# Patient Record
Sex: Male | Born: 1960 | Hispanic: Yes | Marital: Married | Smoking: Never smoker
Health system: Southern US, Community
[De-identification: ages and names within clinical notes are randomized; demographics above are authoritative.]

## PROBLEM LIST (undated history)

## (undated) DIAGNOSIS — I219 Acute myocardial infarction, unspecified: Secondary | ICD-10-CM

## (undated) DIAGNOSIS — E785 Hyperlipidemia, unspecified: Secondary | ICD-10-CM

## (undated) DIAGNOSIS — D638 Anemia in other chronic diseases classified elsewhere: Secondary | ICD-10-CM

## (undated) DIAGNOSIS — K219 Gastro-esophageal reflux disease without esophagitis: Secondary | ICD-10-CM

## (undated) DIAGNOSIS — Z89512 Acquired absence of left leg below knee: Secondary | ICD-10-CM

## (undated) DIAGNOSIS — E7800 Pure hypercholesterolemia, unspecified: Secondary | ICD-10-CM

## (undated) DIAGNOSIS — I10 Essential (primary) hypertension: Secondary | ICD-10-CM

## (undated) DIAGNOSIS — R0989 Other specified symptoms and signs involving the circulatory and respiratory systems: Secondary | ICD-10-CM

## (undated) DIAGNOSIS — I4729 Other ventricular tachycardia: Secondary | ICD-10-CM

## (undated) DIAGNOSIS — I5030 Unspecified diastolic (congestive) heart failure: Secondary | ICD-10-CM

## (undated) DIAGNOSIS — Z9889 Other specified postprocedural states: Secondary | ICD-10-CM

## (undated) DIAGNOSIS — I639 Cerebral infarction, unspecified: Secondary | ICD-10-CM

## (undated) DIAGNOSIS — Z992 Dependence on renal dialysis: Secondary | ICD-10-CM

## (undated) DIAGNOSIS — Z89611 Acquired absence of right leg above knee: Secondary | ICD-10-CM

## (undated) DIAGNOSIS — I472 Ventricular tachycardia: Secondary | ICD-10-CM

## (undated) DIAGNOSIS — N186 End stage renal disease: Secondary | ICD-10-CM

## (undated) DIAGNOSIS — Z8679 Personal history of other diseases of the circulatory system: Secondary | ICD-10-CM

## (undated) DIAGNOSIS — R112 Nausea with vomiting, unspecified: Secondary | ICD-10-CM

## (undated) DIAGNOSIS — I779 Disorder of arteries and arterioles, unspecified: Secondary | ICD-10-CM

## (undated) DIAGNOSIS — I2510 Atherosclerotic heart disease of native coronary artery without angina pectoris: Secondary | ICD-10-CM

## (undated) DIAGNOSIS — I7090 Unspecified atherosclerosis: Secondary | ICD-10-CM

## (undated) DIAGNOSIS — I480 Paroxysmal atrial fibrillation: Secondary | ICD-10-CM

## (undated) DIAGNOSIS — E118 Type 2 diabetes mellitus with unspecified complications: Secondary | ICD-10-CM

## (undated) HISTORY — PX: CATARACT EXTRACTION, BILATERAL: SHX1313

## (undated) HISTORY — DX: Unspecified diastolic (congestive) heart failure: I50.30

## (undated) HISTORY — PX: LEG AMPUTATION BELOW KNEE: SHX694

## (undated) HISTORY — DX: Paroxysmal atrial fibrillation: I48.0

---

## 2006-10-22 ENCOUNTER — Other Ambulatory Visit: Payer: Self-pay

## 2006-10-22 ENCOUNTER — Emergency Department: Payer: Self-pay | Admitting: Emergency Medicine

## 2007-02-17 ENCOUNTER — Emergency Department: Payer: Self-pay | Admitting: Emergency Medicine

## 2007-02-17 ENCOUNTER — Other Ambulatory Visit: Payer: Self-pay

## 2008-12-02 ENCOUNTER — Ambulatory Visit: Payer: Self-pay | Admitting: Internal Medicine

## 2009-01-04 ENCOUNTER — Encounter: Payer: Self-pay | Admitting: Internal Medicine

## 2009-01-13 ENCOUNTER — Encounter: Payer: Self-pay | Admitting: Internal Medicine

## 2009-05-24 ENCOUNTER — Ambulatory Visit: Payer: Self-pay | Admitting: Internal Medicine

## 2009-05-31 ENCOUNTER — Ambulatory Visit: Payer: Self-pay | Admitting: Internal Medicine

## 2011-06-21 ENCOUNTER — Ambulatory Visit: Payer: Self-pay | Admitting: Vascular Surgery

## 2011-08-01 DIAGNOSIS — H5231 Anisometropia: Secondary | ICD-10-CM | POA: Insufficient documentation

## 2011-08-01 DIAGNOSIS — E113299 Type 2 diabetes mellitus with mild nonproliferative diabetic retinopathy without macular edema, unspecified eye: Secondary | ICD-10-CM | POA: Insufficient documentation

## 2011-08-01 DIAGNOSIS — H26499 Other secondary cataract, unspecified eye: Secondary | ICD-10-CM | POA: Insufficient documentation

## 2011-08-01 DIAGNOSIS — H2510 Age-related nuclear cataract, unspecified eye: Secondary | ICD-10-CM | POA: Insufficient documentation

## 2012-06-17 ENCOUNTER — Ambulatory Visit: Payer: Self-pay | Admitting: Vascular Surgery

## 2012-11-25 ENCOUNTER — Ambulatory Visit: Payer: Self-pay | Admitting: Vascular Surgery

## 2013-03-10 ENCOUNTER — Ambulatory Visit: Payer: Self-pay | Admitting: Vascular Surgery

## 2013-03-10 LAB — POTASSIUM: Potassium: 6.6 mmol/L (ref 3.5–5.1)

## 2013-06-28 DIAGNOSIS — R0602 Shortness of breath: Secondary | ICD-10-CM | POA: Insufficient documentation

## 2013-06-28 DIAGNOSIS — I5030 Unspecified diastolic (congestive) heart failure: Secondary | ICD-10-CM | POA: Insufficient documentation

## 2013-06-29 DIAGNOSIS — R0600 Dyspnea, unspecified: Secondary | ICD-10-CM | POA: Insufficient documentation

## 2013-06-29 DIAGNOSIS — E8770 Fluid overload, unspecified: Secondary | ICD-10-CM | POA: Insufficient documentation

## 2013-10-15 HISTORY — PX: AV FISTULA PLACEMENT: SHX1204

## 2014-03-17 DIAGNOSIS — D72829 Elevated white blood cell count, unspecified: Secondary | ICD-10-CM | POA: Insufficient documentation

## 2014-03-17 DIAGNOSIS — E669 Obesity, unspecified: Secondary | ICD-10-CM | POA: Insufficient documentation

## 2014-03-17 DIAGNOSIS — T25022A Burn of unspecified degree of left foot, initial encounter: Secondary | ICD-10-CM | POA: Insufficient documentation

## 2014-03-24 DIAGNOSIS — D649 Anemia, unspecified: Secondary | ICD-10-CM | POA: Insufficient documentation

## 2015-02-01 NOTE — Op Note (Signed)
PATIENT NAME:  [name], [name] MR#:  [id number] DATE OF BIRTH:  [birth year]  DATE OF PROCEDURE:  06/17/2012  PREOPERATIVE DIAGNOSES:  1. Complication of AV fistula, left wrist, with poor flows and poor dialysis runs.  2. End-stage renal disease requiring hemodialysis.   POSTOPERATIVE DIAGNOSES:  1. Complication of AV fistula, left wrist, with poor flows and poor dialysis runs.  2. End-stage renal disease requiring hemodialysis.   PROCEDURES PERFORMED:  1. Contrast injection of left arm AV fistula.  2. Percutaneous transluminal angioplasty to 7 mm, left arterial anastomosis.   SURGEON: Hortencia Pilar, MD  SEDATION: Versed 4 mg plus fentanyl 150 mcg administered IV. Continuous ECG, pulse oximetry and cardiopulmonary monitoring was performed throughout the entire procedure by the interventional radiology nurse. Total sedation time was approximately 45 minutes.   ACCESS: 6 French sheath in a retrograde direction, left forearm AV fistula.   CONTRAST USED: Isovue 24 mL.   FLUOROSCOPY TIME: 1.2 minutes.   INDICATIONS: Mr. Kazee is a 54 year old gentleman on hemodialysis with increasing problems during his dialysis runs. Duplex ultrasound shows a significant narrowing at the level of the arterial anastomosis and he is undergoing angiography with the hope for intervention. Risks and benefits were reviewed, all questions answered, and the patient agrees to proceed.   DESCRIPTION OF PROCEDURE: The patient is taken to special procedures and placed in the supine position and after adequate sedation is achieved the left arm is extended palm upward and prepped and draped in a circumferential fashion. Ultrasound is placed in a sterile sleeve. Ultrasound is utilized secondary to lack of appropriate landmarks and inability to feel the fistula in the proximal forearm and, after identifying the fistula at this level, the micropuncture needle is inserted in a retrograde direction. Image is recorded. Fistula  is noted to be echolucent and compressible indicating patency. Puncture is done under real-time visualization. Microwire followed by microsheath is then inserted, J-wire followed by a 6 [place] sheath. Using a KMP catheter and a Magic torque wire, the catheter is negotiated into the radial artery where hand injection of contrast is utilized to demonstrate the fistula in its entirety. String sign is noted approximately 1 to 2 mm above the actual anastomosis and the remaining portion of the fistula appears widely patent. Magic torque wire is reintroduced and first a 6 x 2 balloon is advanced across the lesion and inflated to 10 atmospheres for one minute and subsequently a 7 x 2 Dorado balloon is advanced across the lesion and inflated to 14 atmospheres for one minute. KMP catheter is then reintroduced into the arterial system and hand injection of contrast is used to demonstrate that there is now wide patency of this segment of the fistula and it matches the surrounding proximal and distal diameters quite nicely. There is a significant improvement in the thrill noted within the fistula itself.   A pursestring suture of 4-0 Monocryl is placed around the sheath, the sheath is removed and there are no immediate complications.   INTERPRETATION: The fistula itself is widely patent with the exception of a narrowing at the level of the arterial anastomosis which responded quite nicely to angioplasty to 7 mm. ____________________________ Katha Cabal, MD ggs:slb D: 06/17/2012 16:44:17 ET T: 06/17/2012 17:33:40 ET JOB#: [id number]  cc: Katha Cabal, MD, <Dictator> Munsoor Lilian Kapur, MD Katha Cabal MD ELECTRONICALLY SIGNED 06/20/2012 16:24

## 2015-02-04 NOTE — Op Note (Signed)
PATIENT NAME:  [name], [name] MR#:  [id number] DATE OF BIRTH:  Dec 05, 1960  DATE OF PROCEDURE:  11/25/2012  PREOPERATIVE DIAGNOSES: 1.  Complication arteriovenous dialysis device with poor flows from left wrist fistula.  2.  End-stage renal disease requiring hemodialysis.   POSTOPERATIVE DIAGNOSIS: 1.  Complication arteriovenous dialysis device with poor flows from left wrist fistula.  2.  End-stage renal disease requiring hemodialysis.   PROCEDURES PERFORMED: 1.  Contrast injection left radiocephalic fistula.  2.  Percutaneous transluminal angioplasty to 7 mm, left arterial anastomosis radiocephalic fistula.   Procedure Performed by: Katha Cabal, M.D.   SEDATION:  Versed 4 mg plus fentanyl 150 mcg administered IV. Continuous ECG, pulse oximetry and cardiopulmonary monitoring is performed throughout the entire procedure by the interventional radiology nurse.   TOTAL SEDATION TIME:  45 minutes.   ACCESS: 6 French sheath retrograde direction, left radiocephalic fistula.   CONTRAST USED:  Isovue 24 mL.   FLUOROSCOPY TIME:  4.5 minutes.   INDICATIONS: The patient is a 54 year old gentleman maintained on hemodialysis via a left wrist radiocephalic fistula. Recently, they have had increasing difficulty with cannulations and very poor flow. Risks and benefits for treatment were reviewed. All questions answered. The patient agrees to proceed with angiography and possible intervention.   DESCRIPTION OF PROCEDURE: The patient is taken to special procedures and placed in the supine position. After adequate sedation is achieved, he is positioned with his left arm extended palm upward. The left arm was prepped and draped in sterile fashion. 1% lidocaine was infiltrated in the soft tissues. Ultrasound is placed in a sterile sleeve. Ultrasound is utilized secondary to lack of appropriate landmarks to avoid vascular injury. Near the antecubital fossa the fistula outflow is identified. It is  echolucent and compressible indicating patency. Image is recorded and after 1% lidocaine is infiltrated, a microneedle is inserted directly into the venous outflow, microwire followed by micro sheath, J-wire followed by a 6 French sheath. A floppy Glidewire and KMP catheter then advanced into the radial artery and hand injection of contrast is utilized to demonstrate the radial artery distally as well as the fistula. Greater than 90% stenosis is noted approximately 1 cm above the anastomosis. 3000 units of heparin is given. A Magic torque wire is advanced into the radial artery and subsequently a 6 x 2 Dorado is inflated to 16 atmospheres. Follow-up angiography demonstrates significant residual stenosis and, therefore, a 7 x 4 Rival balloon is advanced across the lesion and inflated to 12 atmospheres. The inflation is for 1 minute. Follow-up angiography through the KMP catheter demonstrates wide patency of the arterial anastomosis. Venous outflow is somewhat sluggish. There are multiple collaterals, there is spasm around the sheath. More proximally, it is patent.   Pursestring suture of 4-0 Monocryl is placed, the sheath is pulled, pressure is held and there are no immediate complications.   INTERPRETATION: Initial views of the fistula is opacified with contrast and shows a string sign associated with the arterial anastomosis. After a 6 and subsequently 7 mm dilatation, there is complete resolution of this stricture.   SUMMARY: Successful salvage of left radiocephalic fistula as described above.    ____________________________ Katha Cabal, MD ggs:ct D: 11/25/2012 10:51:01 ET T: 11/25/2012 11:20:06 ET JOB#: [id number]  cc: Katha Cabal, MD, <Dictator> Katha Cabal MD ELECTRONICALLY SIGNED 12/01/2012 23:27

## 2015-02-04 NOTE — Consult Note (Signed)
PATIENT NAME:  [name], [name] MR#:  [id number] DATE OF BIRTH:  Apr 19, 1961  DATE OF CONSULTATION:  03/10/2013  REFERRING PHYSICIAN:  Hortencia Pilar, MD CONSULTING PHYSICIAN:  Lucan Riner Lilian Kapur, MD  REASON FOR CONSULTATION:  Hyperkalemia in the setting of known underlying endstage renal disease.   HISTORY OF PRESENT ILLNESS: The patient is a pleasant 54 year old Hispanic male with past medical history of end-stage renal disease on hemodialysis Monday, Wednesday and Friday, hypertension, diabetes mellitus, morbid obesity, anemia of chronic kidney disease, secondary hyperparathyroidism and left upper extremity AV fistula who presented to [facility] for fistulogram. It appears that he has had history of decreased flow in his fistulas in the past. He was to have fistulogram today; however, Dr. Delana Meyer drew a potassium and this was high at 6.5. The patient reports that he did go to his dialysis treatment yesterday. While in special procedures, the patient was administered insulin 5 units IV x 1 with 1 amp of D50 as well as Kayexalate 45 grams p.o. x 1 upon our instruction. The patient states that he has been on dialysis since 2008. He is followed by [facility]. He dialyzes at [facility] [employer]. He has been on dialysis since 2008. Overall he has tolerated this quite well.   PAST MEDICAL HISTORY:  1.  End-stage renal disease on hemodialysis Monday, Wednesday Friday, followed by [facility] at [facility].  2.  Anemia of chronic kidney disease.  3.  Secondary hyperparathyroidism.  4.  Hypertension.  5.  Diabetes mellitus.  6.  Morbid obesity.  7.  Left upper extremity AV fistula.   ALLERGIES: No known drug allergies.   HOME MEDICATIONS: Include: 1.  Sensipar 90 mg p.o. daily.  2.  Renvela 800 mg p.o. 6 tablets with each meal.  3.  Glipizide 5 mg p.o. daily.  4.  Furosemide 40 mg p.o. daily.  5.  Amlodipine 10 mg p.o. daily.   SOCIAL HISTORY: The patient lives in  [place]. He is married. He has no children. He is originally from [place]. He denies tobacco, alcohol or illicit drug use.   FAMILY HISTORY: He states that his father is living and has history of hypertension. Mother is deceased and possibly died secondary to CVA.   REVIEW OF SYSTEMS: CONSTITUTIONAL: Denies fevers, chills or weight loss.  EYES: Denies diplopia or blurry vision.  HEENT: Denies headaches or hearing loss. Denies epistaxis.  CARDIOVASCULAR: Denies chest pain, palpitations, PND or orthopnea.  RESPIRATORY: Denies cough, shortness of breath or hemoptysis.  GASTROINTESTINAL: Denies nausea, vomiting, diarrhea or bloody stools.  GENITOURINARY: Denies frequency, urgency. Does have history of end-stage renal disease.  MUSCULOSKELETAL: Denies joint pain, swelling or redness.  INTEGUMENTARY: Denies skin rashes or lesions.  NEUROLOGIC: Denies focal weakness or numbness.  PSYCHIATRIC: Denies depression or bipolar disorder.  ENDOCRINE: Denies polyuria or polydipsia. Does have history of diabetes mellitus. HEMATOLOGIC AND LYMPHATIC: Denies easy bruisability, bleeding or swollen lymph nodes. Has history of anemia of chronic kidney disease.  ALLERGY/IMMUNOLOGIC: Denies seasonal allergies or history of immunodeficiency.   PHYSICAL EXAMINATION: VITAL SIGNS: Temperature 98.7, pulse 72, respirations 16, blood pressure 174/93 and pulse ox 91%.  GENERAL: Reveals an obese Hispanic male, currently in no acute distress.  HEENT: Normocephalic, atraumatic. Extraocular movements are intact. Pupils equal, round and reactive to light. No scleral icterus. Conjunctivae are pink. No epistaxis noted. Gross hearing intact. Oral mucosa moist.  NECK: Is supple without JVD or lymphadenopathy.  LUNGS: Are clear to auscultation bilaterally with normal  respiratory effort.  HEART: S1, S2 regular rate and rhythm. No murmurs, rubs or gallops appreciated.  ABDOMEN: Obese, soft, nontender and nondistended. Bowel sounds  positive. No rebound or guarding. No gross organomegaly appreciated.  EXTREMITIES: No clubbing, cyanosis or edema.  NEUROLOGIC: The patient is alert and oriented to time, person and place. Strength is 5 out of 5 in both upper and lower extremities.  MUSCULOSKELETAL: No joint redness, swelling or tenderness appreciated.  SKIN: Is warm and dry. No rash is noted. VASCULAR ACCESS: The patient has a left upper extremity AV fistula in place. There is an audible bruit and palpable thrill in the access.  PSYCHIATRIC: The patient with appropriate affect and appears to have excellent insight into his current illness.   LABORATORY DATA: Serum potassium 6.5. Otherwise, no other labs available at present.   IMPRESSION AND RECOMMENDATIONS: This is a 54 year old Hispanic male with past medical history of end-stage renal disease on hemodialysis since 2008 followed by [facility] at [facility], anemia of chronic disease, secondary hyperparathyroidism, obesity, hypertension, left upper extremity arteriovenous fistula and diabetes mellitus who presented to [facility] for fistulogram and found to have hyperkalemia. 1.  Hyperkalemia. Serum potassium noted to be elevated at 6.5. The patient was administered Kayexalate 45 grams p.o. x 1 and regular insulin 5 units IV x 1 as well as 1 amp of D50. I have instructed the nurses to recheck a serum potassium prior to the fistulogram. Hopefully these measures will bring serum potassium down. If not the patient will likely need dialysis again before the fistulogram in which case the patient would need to be admitted.  2.  End-stage renal disease on hemodialysis Monday, Wednesday and Friday, followed by [facility]. The patient had dialysis yesterday. It is unclear as to why potassium is elevated today, but may be secondary to dietary indiscretion. No acute indication for dialysis immediately.  However, if potassium does not come down with conservative  measures above, we may need to consider this.  3.  Anemia of chronic kidney disease. This will require ongoing outpatient management at the dialysis center with Epogen. 4.  Secondary hyperparathyroidism. The patient is on considerable Renvela and also takes Sensipar 90 mg p.o. daily. I have advised him to continue on binder regimen as suggested by his outpatient nephrologist and also to continue Sensipar.   I would like to thank Dr. Delana Meyer for this kind referral. Further plan as the patient progresses. ____________________________ Tama High, MD mnl:sb D: 03/10/2013 12:54:19 ET T: 03/10/2013 13:08:41 ET JOB#: [id number]  cc: Tama High, MD, <Dictator> Tama High MD ELECTRONICALLY SIGNED 04/01/2013 10:49

## 2015-02-04 NOTE — Op Note (Signed)
PATIENT NAME:  [name], [name] MR#:  [id number] DATE OF BIRTH:  20-Jan-1961  DATE OF PROCEDURE:  03/10/2013  PREOPERATIVE DIAGNOSES: 1.  Complication arteriovenous dialysis device with poor KT/v. 2.  End-stage renal disease requiring hemodialysis.   POSTOPERATIVE DIAGNOSES:  1.  Complication arteriovenous dialysis device with poor KT/v. 2.  End-stage renal disease requiring hemodialysis.   PROCEDURES PERFORMED:  1.  Contrast injection, left wrist radiocephalic fistula.  2.  Percutaneous transluminal angioplasty at the arterial anastomosis, left wrist fistula. Maximal diameter 6.   SURGEON: Hortencia Pilar, MD  SEDATION:  Precedex drip with fentanyl IV. Continuous ECG, pulse oximetry and cardiopulmonary monitoring is performed throughout the entire procedure by the interventional radiology nurse. Total sedation time was 45 minutes.   ACCESS: 6-French sheath, retrograde direction, left wrist fistula.   CONTRAST USED: Isovue 25 mL.   FLUOROSCOPY TIME: 3.0 minutes.   INDICATIONS: [name] is a 54 year old gentleman who has been having increasing problems at dialysis and has been referred by the dialysis center for evaluation of his fistula. Risks and benefits were reviewed. All questions answered. The patient agrees to proceed.   DESCRIPTION OF PROCEDURE: The patient is taken to special procedures and placed in the supine position. After adequate sedation on a Precedex drip has been achieved and supplemental fentanyl has been given, he is positioned supine with his left arm extended palm upward. The left arm is prepped and draped in sterile fashion. 1% lidocaine is infiltrated in the soft tissues of the forearm  near the antecubital fossa. Ultrasound is placed in a sterile sleeve. Ultrasound is utilized secondary to lack of appropriate landmarks and to avoid vascular injury. Under direct ultrasound visualization, the fistula is scanned from the access portion more proximally. The vein is  noted to be patent and measuring at least 6 to 8 mm in diameter throughout its course. A segment approximately 2 fingerbreadths below the antecubital crease is then selected and accessed under direct visualization.  Image is recorded for the permanent record. Compressibility and homogeneous appearance suggests patency.   Microwire followed by microsheath, J-wire followed by a 6-French.  Using a floppy Glidewire and KMP catheter, the wire catheter combination is negotiated into the radial artery and extended more proximally. KMP catheter is then utilized to perform angiography. This demonstrates high-grade stenosis right at the anastomosis as well as in association with this one in the vein just above the anastomosis. This essentially is a single lesion, but given the size of the radial artery to match this, a 3.5 mm balloon will be used whereas the portion that is existing within the vein proper will be dilated more aggressively. 3000 units of heparin is given and a V-18 wire is exchanged through the catheter.   First a 5 x 2 and then a 6 x 4 balloon is used to angioplasty the venous portion.  Inflations are to 14 atmospheres. Then a 3 mm Fox balloon is advanced across the actual arterial anastomosis proper and inflated to 24 atmospheres to represent a diameter of 3.5. KMP catheter is then advanced over the wire, the wire removed and follow-up angiography is performed. There is resolution of the narrowing and there is significant improvement in flow. On further evaluation of the fistula, there appears to be occlusion at the level of the sheath on magnified views. With the sheath pulled back, this appears to be spasm. There is no evidence of thrombus. No stricture was noted in this region on the ultrasound preoperative exam.  A pursestring suture of Monocryl is placed, sheath is pulled, pressure held and there are no immediate complications.   INTERPRETATION: Images of the fistula demonstrate recurrence of  the stenosis, at the arterial anastomosis, and this is treated with an excellent result, as described above. The main outflow of the fistula appears to be adequate. The area of the 2 buttonholes is widely patent.  ____________________________ Katha Cabal, MD ggs:sb D: 03/10/2013 12:29:36 ET T: 03/10/2013 12:47:53 ET JOB#: [id number]  cc: Katha Cabal, MD, <Dictator> Katha Cabal MD ELECTRONICALLY SIGNED 03/18/2013 11:02

## 2015-09-27 ENCOUNTER — Other Ambulatory Visit: Payer: Self-pay | Admitting: Vascular Surgery

## 2015-10-03 ENCOUNTER — Ambulatory Visit
Admission: RE | Admit: 2015-10-03 | Discharge: 2015-10-03 | Disposition: A | Discharge status: Home / Self Care | Payer: Medicare Other | Source: Ambulatory Visit | Attending: Vascular Surgery | Admitting: Vascular Surgery

## 2015-10-03 ENCOUNTER — Encounter
Admission: RE | Disposition: A | Discharge status: Home / Self Care | Payer: Self-pay | Source: Ambulatory Visit | Attending: Vascular Surgery

## 2015-10-03 ENCOUNTER — Encounter: Payer: Self-pay | Admitting: *Deleted

## 2015-10-03 DIAGNOSIS — Z992 Dependence on renal dialysis: Secondary | ICD-10-CM | POA: Insufficient documentation

## 2015-10-03 DIAGNOSIS — T82858A Stenosis of vascular prosthetic devices, implants and grafts, initial encounter: Secondary | ICD-10-CM | POA: Diagnosis present

## 2015-10-03 DIAGNOSIS — Z79899 Other long term (current) drug therapy: Secondary | ICD-10-CM | POA: Diagnosis not present

## 2015-10-03 DIAGNOSIS — N186 End stage renal disease: Secondary | ICD-10-CM | POA: Diagnosis not present

## 2015-10-03 DIAGNOSIS — I120 Hypertensive chronic kidney disease with stage 5 chronic kidney disease or end stage renal disease: Secondary | ICD-10-CM | POA: Diagnosis not present

## 2015-10-03 DIAGNOSIS — Y832 Surgical operation with anastomosis, bypass or graft as the cause of abnormal reaction of the patient, or of later complication, without mention of misadventure at the time of the procedure: Secondary | ICD-10-CM | POA: Diagnosis not present

## 2015-10-03 DIAGNOSIS — E875 Hyperkalemia: Secondary | ICD-10-CM | POA: Diagnosis not present

## 2015-10-03 HISTORY — PX: PERIPHERAL VASCULAR CATHETERIZATION: SHX172C

## 2015-10-03 LAB — POTASSIUM (ARMC VASCULAR LAB ONLY): Potassium (ARMC vascular lab): 6.2 — ABNORMAL HIGH (ref 3.5–5.1)

## 2015-10-03 LAB — GLUCOSE, CAPILLARY: GLUCOSE-CAPILLARY: 130 mg/dL — AB (ref 65–99)

## 2015-10-03 LAB — POTASSIUM: POTASSIUM: 6.5 mmol/L — AB (ref 3.5–5.1)

## 2015-10-03 SURGERY — A/V SHUNTOGRAM/FISTULAGRAM
Anesthesia: Moderate Sedation

## 2015-10-03 MED ORDER — IOHEXOL 300 MG/ML  SOLN
INTRAMUSCULAR | Status: DC | PRN
  Administered 2015-10-03: 25 mL via INTRAVENOUS

## 2015-10-03 MED ORDER — METHYLPREDNISOLONE SODIUM SUCC 125 MG IJ SOLR: 125.0000 mg | INTRAMUSCULAR | Status: DC | PRN

## 2015-10-03 MED ORDER — FENTANYL CITRATE (PF) 100 MCG/2ML IJ SOLN
INTRAMUSCULAR | Status: AC
  Filled 2015-10-03: qty 2

## 2015-10-03 MED ORDER — HEPARIN SODIUM (PORCINE) 1000 UNIT/ML IJ SOLN
INTRAMUSCULAR | Status: AC
  Filled 2015-10-03: qty 1

## 2015-10-03 MED ORDER — MIDAZOLAM HCL 2 MG/2ML IJ SOLN
INTRAMUSCULAR | Status: DC | PRN
  Administered 2015-10-03: 1 mg via INTRAVENOUS
  Administered 2015-10-03: 2 mg via INTRAVENOUS

## 2015-10-03 MED ORDER — FENTANYL CITRATE (PF) 100 MCG/2ML IJ SOLN
INTRAMUSCULAR | Status: DC | PRN
  Administered 2015-10-03: 50 ug via INTRAVENOUS
  Administered 2015-10-03: 25 ug via INTRAVENOUS

## 2015-10-03 MED ORDER — MIDAZOLAM HCL 5 MG/5ML IJ SOLN
INTRAMUSCULAR | Status: AC
  Filled 2015-10-03: qty 5

## 2015-10-03 MED ORDER — HEPARIN (PORCINE) IN NACL 2-0.9 UNIT/ML-% IJ SOLN
INTRAMUSCULAR | Status: AC
  Filled 2015-10-03: qty 1000

## 2015-10-03 MED ORDER — DEXTROSE 5 % IV SOLN
1.5000 g | INTRAVENOUS | Status: AC
  Administered 2015-10-03: 1.5 g via INTRAVENOUS

## 2015-10-03 MED ORDER — SODIUM CHLORIDE 0.9 % IV SOLN
INTRAVENOUS | Status: DC
  Administered 2015-10-03: 12:00:00 via INTRAVENOUS

## 2015-10-03 MED ORDER — ONDANSETRON HCL 4 MG/2ML IJ SOLN: 4.0000 mg | Freq: Four times a day (QID) | INTRAMUSCULAR | Status: DC | PRN

## 2015-10-03 MED ORDER — FAMOTIDINE 20 MG PO TABS: 40.0000 mg | ORAL_TABLET | ORAL | Status: DC | PRN

## 2015-10-03 MED ORDER — HYDROMORPHONE HCL 1 MG/ML IJ SOLN: 1.0000 mg | Freq: Once | INTRAMUSCULAR | Status: DC

## 2015-10-03 MED ORDER — HEPARIN SODIUM (PORCINE) 1000 UNIT/ML IJ SOLN
INTRAMUSCULAR | Status: DC | PRN
Start: 2015-10-03 — End: 2015-10-03
  Administered 2015-10-03: 3000 [IU] via INTRAVENOUS

## 2015-10-03 MED ORDER — LIDOCAINE-EPINEPHRINE (PF) 1 %-1:200000 IJ SOLN
INTRAMUSCULAR | Status: AC
  Filled 2015-10-03: qty 30

## 2015-10-03 SURGICAL SUPPLY — 10 items
BALLN DORADO 5X40X80 (BALLOONS) ×3
BALLOON DORADO 5X40X80 (BALLOONS) ×1 IMPLANT
CANNULA 5F STIFF (CANNULA) ×3 IMPLANT
CATH 5F KA2 (CATHETERS) ×3 IMPLANT
DEVICE PRESTO INFLATION (MISCELLANEOUS) ×3 IMPLANT
DRAPE BRACHIAL (DRAPES) ×3 IMPLANT
PACK ANGIOGRAPHY (CUSTOM PROCEDURE TRAY) ×3 IMPLANT
SHEATH BRITE TIP 6FRX5.5 (SHEATH) ×3 IMPLANT
TOWEL OR 17X26 4PK STRL BLUE (TOWEL DISPOSABLE) ×3 IMPLANT
WIRE MAGIC TORQUE 260C (WIRE) ×3 IMPLANT

## 2015-10-03 NOTE — Op Note (Signed)
 VEIN AND VASCULAR SURGERY    OPERATIVE NOTE   PROCEDURE: 1.   Left radiocephalic arteriovenous fistula cannulation under ultrasound guidance 2.   Left arm fistulagram including central venogram 3.   Percutaneous transluminal angioplasty of perianastomotic stenosis with 5 mm diameter by 4 cm length high pressure angioplasty balloon  PRE-OPERATIVE DIAGNOSIS: 1. ESRD 2. Poorly functional left radiocephalic AVF 3. Hyperkalemia  POST-OPERATIVE DIAGNOSIS: same as above   SURGEON: Leotis Pain, MD  ANESTHESIA: local with MCS  ESTIMATED BLOOD LOSS: Minimal  FINDING(S): 1. 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 male who presents with malfunctioning  left radiocephalic arteriovenous fistula with poor flow and clearances resulting in elevated potassium.  The patient is scheduled for  left arm fistulagram.  The patient is aware the risks include but are not limited to: bleeding, infection, thrombosis of the cannulated access, and possible anaphylactic reaction to the contrast.  The patient is aware of the risks of the procedure and elects to proceed forward.  DESCRIPTION: After full informed written consent was obtained, the patient was brought back to the angiography suite and placed supine upon the angiography table.  The patient was connected to monitoring equipment.  The  left arm was prepped and draped in the standard fashion for a percutaneous access intervention.  Under ultrasound guidance, the  left radiocephalic arteriovenous fistula was cannulated with a micropuncture needle under direct ultrasound guidance near the antecubital fossa and a retrograde fashion and a permanent image was performed.  The microwire was advanced into the fistula and the needle was exchanged for the a microsheath.  I then upsized to a 6 Fr Sheath and imaging was performed.  Hand injections were completed  to image the access including the central venous system. This demonstrated dual outflow in the upper arm with patent central veins.  To evaluate the anastomotic region, a Kumpe catheter and Magic torque wire were placed to the anastomosis and just into the radial artery. This demonstrated occlusion of the radial artery a couple of centimeters beyond the anastomosis with a high-grade stenosis of a proximally 90% in the anastomotic region just into the cephalic vein. Based on the images, this patient will need intervention to this stenosis to improve the function of the fistula. I then gave the patient 3000 units of intravenous heparin.  I then crossed the stenosis with a Magic Tourqe wire and a Kumpe catheter.  Based on the imaging, a 5 mm x 4 cm  High pressure angioplasty balloon was selected.  The balloon was centered around the anastomotic stenosis with the distal tip just into the radial artery and inflated to 28 ATM for 1 minute(s).  On completion imaging, a 25-30% residual stenosis was present.     Based on the completion imaging, no further intervention is necessary.  The wire and balloon were removed from the sheath.  A 4-0 Monocryl purse-string suture was sewn around the sheath.  The sheath was removed while tying down the suture.  A sterile bandage was applied to the puncture site.  COMPLICATIONS: None  CONDITION: Stable   [name],[name]  10/03/2015 11:09 AM

## 2015-10-03 NOTE — H&P (Signed)
[facility] Admission History & Physical  MRN : [id number]  [name] is a 54 y.o. ([birth year]) male who presents with chief complaint of No chief complaint on file. Marland Kitchen  History of Present Illness: Patient with poor flow in his access on dialysis.  Sent from dialysis center to evaluate this.  No other complaints.  Current Facility-Administered Medications  Medication Dose Route Frequency Provider Last Rate Last Dose  . 0.9 %  sodium chloride infusion   Intravenous Continuous Kimberly A Stegmayer, PA-C      . cefUROXime (ZINACEF) 1.5 g in dextrose 5 % 50 mL IVPB  1.5 g Intravenous 30 min Pre-Op Kimberly A Stegmayer, PA-C      . famotidine (PEPCID) tablet 40 mg  40 mg Oral PRN Janalyn Harder Stegmayer, PA-C      . HYDROmorphone (DILAUDID) injection 1 mg  1 mg Intravenous Once [employer], PA-C      . methylPREDNISolone sodium succinate (SOLU-MEDROL) 125 mg/2 mL injection 125 mg  125 mg Intravenous PRN Kimberly A Stegmayer, PA-C      . ondansetron (ZOFRAN) injection 4 mg  4 mg Intravenous Q6H PRN Sela Hua, PA-C        Past Medical History  Diagnosis Date  . Chronic kidney disease     Past Surgical History  Procedure Laterality Date  . Av fistula placement Left 2015    Social History Social History  Substance Use Topics  . Smoking status: Never Smoker   . Smokeless tobacco: None  . Alcohol Use: No    Family History No bleeding disorders, clotting disorders, autoimmune diseases, or aneurysm.  No Known Allergies   REVIEW OF SYSTEMS (Negative unless checked)  Constitutional: [] Weight loss  [] Fever  [] Chills Cardiac: [] Chest pain   [] Chest pressure   [] Palpitations   [] Shortness of breath when laying flat   [] Shortness of breath at rest   [] Shortness of breath with exertion. Vascular:  [] Pain in legs with walking   [] Pain in legs at rest   [] Pain in legs when laying flat   [] Claudication   [] Pain in feet when walking  [] Pain in  feet at rest  [] Pain in feet when laying flat   [] History of DVT   [] Phlebitis   [] Swelling in legs   [] Varicose veins   [] Non-healing ulcers Pulmonary:   [] Uses home oxygen   [] Productive cough   [] Hemoptysis   [] Wheeze  [] COPD   [] Asthma Neurologic:  [] Dizziness  [] Blackouts   [] Seizures   [] History of stroke   [] History of TIA  [] Aphasia   [] Temporary blindness   [] Dysphagia   [] Weakness or numbness in arms   [] Weakness or numbness in legs Musculoskeletal:  [] Arthritis   [] Joint swelling   [] Joint pain   [] Low back pain Hematologic:  [] Easy bruising  [] Easy bleeding   [] Hypercoagulable state   [] Anemic  [] Hepatitis Gastrointestinal:  [] Blood in stool   [] Vomiting blood  [] Gastroesophageal reflux/heartburn   [] Difficulty swallowing. Genitourinary:  [x] Chronic kidney disease   [] Difficult urination  [] Frequent urination  [] Burning with urination   [] Blood in urine Skin:  [] Rashes   [] Ulcers   [] Wounds Psychological:  [] History of anxiety   []  History of major depression.  Physical Examination  Filed Vitals:   10/03/15 0842  BP: 130/97  Pulse: 81  Temp: 97.9 F (36.6 C)  TempSrc: Oral  Height: 5\' 8"  (1.727 m)  Weight: 114 kg (251 lb 5.2 oz)  SpO2: 96%   Body  mass index is 38.22 kg/(m^2). Gen: WD/WN, NAD Head: [place]/AT, No temporalis wasting. Prominent temp pulse not noted. Ear/Nose/Throat: Hearing grossly intact, nares w/o erythema or drainage, oropharynx w/o Erythema/Exudate,  Eyes: PERRLA, EOMI.  Neck: Supple, no nuchal rigidity.  No bruit or JVD.  Pulmonary:  Good air movement, clear to auscultation bilaterally, no use of accessory muscles.  Cardiac: RRR, normal S1, S2, no Murmurs, rubs or gallops. Vascular:  Vessel Right Left  Radial Palpable Palpable                                   Gastrointestinal: soft, non-tender/non-distended. No guarding/reflex.  Musculoskeletal: M/S 5/5 throughout.  Extremities without ischemic changes.  No deformity or atrophy.  Neurologic: CN  2-12 intact. Pain and light touch intact in extremities.  Symmetrical.  Speech is fluent. Motor exam as listed above. Psychiatric: Judgment intact, Mood & affect appropriate for pt's clinical situation. Dermatologic: No rashes or ulcers noted.  No cellulitis or open wounds. Lymph : No Cervical, Axillary, or Inguinal lymphadenopathy.   CBC No results found for: WBC, HGB, HCT, MCV, PLT  BMET    Component Value Date/Time   K 6.6* 03/10/2013 1351   CrCl cannot be calculated (Patient has no serum creatinine result on file.).  COAG No results found for: INR, PROTIME  Radiology No results found.    Assessment/Plan 1. ESRD. Access not working well 2. dysfunction of dialysis access. For fistulagram today.  3. HTN. Controlled on outpatient meds   [name],[name], MD  10/03/2015 9:04 AM

## 2015-10-03 NOTE — OR Nursing (Signed)
10:55 having trouble obtain BP on rt leg or arm. Was finally able to get a mean BP.

## 2015-10-03 NOTE — Progress Notes (Signed)
Pt doing well post procedure, thrill /bruit present at access, interpreter present with discharge instructions given with questions answered. return appt. Given,taking po's without difficulty.

## 2015-10-03 NOTE — Progress Notes (Signed)
Arrangements made for pt to go straight to dialysis from here secondarily to k level today.

## 2015-10-03 NOTE — Discharge Instructions (Signed)
AND       Dialysis Fistula    Rt Inflow Artery       Lt Inflow Artery       Rt Outflow Vein       Lt Outflow Vein        Peak Sys Vel (cm/s) Ratio Diameter (cm) Depth (cm)  Inflow Artery @LABV (DIALYSIS INFLOW ART PEAK SYS VEL)@ @LABV (DIALYSIS INFLOW ART RATIO)@    AV Anastomosis @LABV (ART-VENOUS ANASTAMOSIS PEAK SYS VEL)@ @LABV (ART-VENOUS ANASTAMOSIS RATIO)@    Outflow Vein Seg 1 @LABV (DIALYSIS FIST OUTFLOW VEIN SEG 1 PEAK SYS VEL)@ @LABV (DIALYSIS FIST OUTFLOW VEIN SEG 1 RATIO)@ @LABV (DIALYSIS OUTFLOW SEG1 DIAM)@ @LABV (DIALYSIS OUTFLOW SEG1 DEPTH)@  Outflow Vein Seg 2 @LABV (DIALYSIS FIST OUTFLOW VEIN SEG 2 PEAK SYS VEL)@ @LABV (DIALYSIS FIST OUTFLOW VEIN SEG 2 RATIO)@ @LABV (DIALYSIS OUTFLOW SEG2 DIAM)@ @LABV (DIALYSIS OUTFLOW SEG2 DEPTH)@  Outflow Vein Seg 3 @LABV (DIALYSIS FIST OUTFLOW VEIN SEG 3 PEAK SYS VEL)@ @LABV (DIALYSIS FIST OUTFLOW VEIN SEG 3 RATIO)@ @LABV (DIALYSIS OUTFLOW SEG3 DIAM)@ @LABV (DIALYSIS OUTFLOW SEG3 DEPTH)@  Outflow Vein Seg 4 @LABV (DIALYSIS GRAFT OUTFLOW SEG4 PEAK SYS VEL)@ @LABV (DIALYSIS GRAFT OUTFLOW SEG4 RATIO)@ @LABV (DIALYSIS OUTFLOW SEG4 DIAM)@ @LABV (DIALYSIS OUTFLOW SEG4 BY:[id number]   Location                              Fistulografa, cuidados posteriores (Fistulogram, Care After) Siga estas instrucciones durante las prximas semanas. Estas indicaciones le proporcionan informacin general acerca de cmo deber cuidarse despus del procedimiento. El mdico tambin podr darle instrucciones ms especficas. El tratamiento ha sido planificado segn las prcticas mdicas actuales, pero en algunos casos pueden ocurrir problemas. Comunquese con el mdico si tiene algn problema o tiene dudas despus del procedimiento. QU ESPERAR DESPUS DEL PROCEDIMIENTO Despus del procedimiento, es normal tener:  Una pequea molestia en la zona donde se colocaron los catteres.  Un pequeo hematoma alrededor de la fstula.  Somnolencia y Programmer, applications. INSTRUCCIONES PARA EL CUIDADO EN EL  HOGAR  Haga reposo en su casa, el da despus del procedimiento.  No conduzca ni opere maquinaria pesada mientras toma analgsicos.  Tome los medicamentos solamente como se lo haya indicado el mdico.  No tome baos de inmersin, no nade ni use el jacuzzi hasta que el mdico lo autorice. Puede ducharse 24horas despus del procedimiento o segn las indicaciones del mdico.  Hay muchas maneras distintas de cerrar y cubrir una incisin, como puntos, pegamento para la piel y tiras [place]. Siga todas las indicaciones del mdico respecto a lo siguiente:  Cuidados de la herida.  Cambiar y Press photographer el vendaje.  Quitar el cierre de la incisin.  Controle cuidadosamente la fstula de dilisis. SOLICITE ATENCIN MDICA SI:  Tiene secrecin, enrojecimiento, hinchazn o dolor en TEFL teacher de insercin del catter.  Tiene fiebre.  Tiene escalofros. SOLICITE ATENCIN MDICA DE INMEDIATO SI:  Se siente dbil.  Tiene problemas de equilibrio.  Tiene dificultad para mover los brazos o las piernas.  Tiene problemas visuales o para hablar.  Ya no puede sentir una vibracin o un zumbido cuando [employer] dedos sobre la fstula de dilisis.  La extremidad que se [place] para el procedimiento:  Se hincha.  Duele.  Est fra.  Cambia de color, por ejemplo, se torna azulado o blanco plido.   Esta informacin no tiene Marine scientist el consejo del mdico. Asegrese de hacerle al mdico cualquier pregunta que tenga.   Document Released: 02/15/2014 Elsevier Interactive Patient Education [employer].

## 2015-10-03 NOTE — OR Nursing (Signed)
H&P medication list incorrect, patient reports taking medications posted but unsure of dosage,

## 2015-12-09 DIAGNOSIS — E1142 Type 2 diabetes mellitus with diabetic polyneuropathy: Secondary | ICD-10-CM | POA: Insufficient documentation

## 2015-12-09 DIAGNOSIS — L97519 Non-pressure chronic ulcer of other part of right foot with unspecified severity: Secondary | ICD-10-CM

## 2015-12-09 DIAGNOSIS — E11621 Type 2 diabetes mellitus with foot ulcer: Secondary | ICD-10-CM | POA: Insufficient documentation

## 2016-01-11 ENCOUNTER — Other Ambulatory Visit: Payer: Self-pay | Admitting: Vascular Surgery

## 2016-01-12 ENCOUNTER — Other Ambulatory Visit
Admission: RE | Admit: 2016-01-12 | Discharge: 2016-01-12 | Disposition: A | Discharge status: Home / Self Care | Payer: Medicare Other | Source: Ambulatory Visit | Attending: Vascular Surgery | Admitting: Vascular Surgery

## 2016-01-12 DIAGNOSIS — Z0000 Encounter for general adult medical examination without abnormal findings: Secondary | ICD-10-CM | POA: Diagnosis present

## 2016-01-12 LAB — POTASSIUM: Potassium: 3.7 mmol/L (ref 3.5–5.1)

## 2016-01-16 ENCOUNTER — Ambulatory Visit
Admission: RE | Admit: 2016-01-16 | Discharge: 2016-01-16 | Disposition: A | Discharge status: Home / Self Care | Payer: Medicare Other | Source: Ambulatory Visit | Attending: Vascular Surgery | Admitting: Vascular Surgery

## 2016-01-16 ENCOUNTER — Encounter
Admission: RE | Disposition: A | Discharge status: Home / Self Care | Payer: Self-pay | Source: Ambulatory Visit | Attending: Vascular Surgery

## 2016-01-16 ENCOUNTER — Encounter: Payer: Self-pay | Admitting: *Deleted

## 2016-01-16 DIAGNOSIS — N289 Disorder of kidney and ureter, unspecified: Secondary | ICD-10-CM | POA: Insufficient documentation

## 2016-01-16 DIAGNOSIS — Z833 Family history of diabetes mellitus: Secondary | ICD-10-CM | POA: Diagnosis not present

## 2016-01-16 DIAGNOSIS — I639 Cerebral infarction, unspecified: Secondary | ICD-10-CM | POA: Diagnosis not present

## 2016-01-16 DIAGNOSIS — E119 Type 2 diabetes mellitus without complications: Secondary | ICD-10-CM | POA: Insufficient documentation

## 2016-01-16 DIAGNOSIS — I70268 Atherosclerosis of native arteries of extremities with gangrene, other extremity: Secondary | ICD-10-CM | POA: Insufficient documentation

## 2016-01-16 DIAGNOSIS — I999 Unspecified disorder of circulatory system: Secondary | ICD-10-CM | POA: Insufficient documentation

## 2016-01-16 DIAGNOSIS — Z79899 Other long term (current) drug therapy: Secondary | ICD-10-CM | POA: Insufficient documentation

## 2016-01-16 DIAGNOSIS — N186 End stage renal disease: Secondary | ICD-10-CM | POA: Diagnosis not present

## 2016-01-16 DIAGNOSIS — Z89512 Acquired absence of left leg below knee: Secondary | ICD-10-CM | POA: Insufficient documentation

## 2016-01-16 DIAGNOSIS — Z7982 Long term (current) use of aspirin: Secondary | ICD-10-CM | POA: Insufficient documentation

## 2016-01-16 HISTORY — PX: PERIPHERAL VASCULAR CATHETERIZATION: SHX172C

## 2016-01-16 LAB — GLUCOSE, CAPILLARY: GLUCOSE-CAPILLARY: 100 mg/dL — AB (ref 65–99)

## 2016-01-16 LAB — POTASSIUM (ARMC VASCULAR LAB ONLY): POTASSIUM (ARMC VASCULAR LAB): 4.6 (ref 3.5–5.1)

## 2016-01-16 SURGERY — ABDOMINAL AORTOGRAM W/LOWER EXTREMITY
Site: Leg Lower | Laterality: Right | Wound class: Clean

## 2016-01-16 MED ORDER — IOPAMIDOL (ISOVUE-300) INJECTION 61%
INTRAVENOUS | Status: DC | PRN
  Administered 2016-01-16: 55 mL via INTRA_ARTERIAL

## 2016-01-16 MED ORDER — LIDOCAINE-EPINEPHRINE (PF) 1 %-1:200000 IJ SOLN
INTRAMUSCULAR | Status: AC
  Filled 2016-01-16: qty 30

## 2016-01-16 MED ORDER — FENTANYL CITRATE (PF) 100 MCG/2ML IJ SOLN
INTRAMUSCULAR | Status: AC
  Filled 2016-01-16: qty 2

## 2016-01-16 MED ORDER — HYDRALAZINE HCL 20 MG/ML IJ SOLN: 5.0000 mg | INTRAMUSCULAR | Status: DC | PRN

## 2016-01-16 MED ORDER — PHENOL 1.4 % MT LIQD: 1.0000 | OROMUCOSAL | Status: DC | PRN

## 2016-01-16 MED ORDER — HYDROMORPHONE HCL 1 MG/ML IJ SOLN: 1.0000 mg | Freq: Once | INTRAMUSCULAR | Status: DC

## 2016-01-16 MED ORDER — HEPARIN (PORCINE) IN NACL 2-0.9 UNIT/ML-% IJ SOLN
INTRAMUSCULAR | Status: AC
  Filled 2016-01-16: qty 1000

## 2016-01-16 MED ORDER — ACETAMINOPHEN 325 MG PO TABS: 325.0000 mg | ORAL_TABLET | ORAL | Status: DC | PRN

## 2016-01-16 MED ORDER — HYDROMORPHONE HCL 1 MG/ML IJ SOLN: 0.5000 mg | INTRAMUSCULAR | Status: DC | PRN

## 2016-01-16 MED ORDER — HEPARIN SODIUM (PORCINE) 1000 UNIT/ML IJ SOLN
INTRAMUSCULAR | Status: AC
  Filled 2016-01-16: qty 1

## 2016-01-16 MED ORDER — LIDOCAINE-EPINEPHRINE (PF) 1 %-1:200000 IJ SOLN
INTRAMUSCULAR | Status: DC | PRN
  Administered 2016-01-16: 10 mL via INTRADERMAL

## 2016-01-16 MED ORDER — FAMOTIDINE 20 MG PO TABS: 40.0000 mg | ORAL_TABLET | ORAL | Status: DC | PRN

## 2016-01-16 MED ORDER — DEXTROSE 5 % IV SOLN
1.5000 g | INTRAVENOUS | Status: AC
  Administered 2016-01-16: 1.5 g via INTRAVENOUS

## 2016-01-16 MED ORDER — HEPARIN SODIUM (PORCINE) 1000 UNIT/ML IJ SOLN
INTRAMUSCULAR | Status: DC | PRN
  Administered 2016-01-16: 5000 [IU] via INTRAVENOUS

## 2016-01-16 MED ORDER — ACETAMINOPHEN 325 MG RE SUPP
325.0000 mg | RECTAL | Status: DC | PRN
  Filled 2016-01-16: qty 2

## 2016-01-16 MED ORDER — ONDANSETRON HCL 4 MG/2ML IJ SOLN: 4.0000 mg | Freq: Four times a day (QID) | INTRAMUSCULAR | Status: DC | PRN

## 2016-01-16 MED ORDER — METHYLPREDNISOLONE SODIUM SUCC 125 MG IJ SOLR: 125.0000 mg | INTRAMUSCULAR | Status: DC | PRN

## 2016-01-16 MED ORDER — SODIUM CHLORIDE 0.9 % IV SOLN
INTRAVENOUS | Status: DC
  Administered 2016-01-16 (×2): via INTRAVENOUS

## 2016-01-16 MED ORDER — LABETALOL HCL 5 MG/ML IV SOLN: 10.0000 mg | INTRAVENOUS | Status: DC | PRN

## 2016-01-16 MED ORDER — OXYCODONE-ACETAMINOPHEN 5-325 MG PO TABS: 1.0000 | ORAL_TABLET | ORAL | Status: DC | PRN

## 2016-01-16 MED ORDER — GUAIFENESIN-DM 100-10 MG/5ML PO SYRP
15.0000 mL | ORAL_SOLUTION | ORAL | Status: DC | PRN
Start: 2016-01-16 — End: 2016-01-16

## 2016-01-16 MED ORDER — METOPROLOL TARTRATE 1 MG/ML IV SOLN: 2.0000 mg | INTRAVENOUS | Status: DC | PRN

## 2016-01-16 MED ORDER — MIDAZOLAM HCL 5 MG/5ML IJ SOLN
INTRAMUSCULAR | Status: AC
  Filled 2016-01-16: qty 5

## 2016-01-16 MED ORDER — FENTANYL CITRATE (PF) 100 MCG/2ML IJ SOLN
INTRAMUSCULAR | Status: DC | PRN
  Administered 2016-01-16 (×3): 50 ug via INTRAVENOUS

## 2016-01-16 MED ORDER — MIDAZOLAM HCL 2 MG/2ML IJ SOLN
INTRAMUSCULAR | Status: DC | PRN
  Administered 2016-01-16: 2 mg via INTRAVENOUS
  Administered 2016-01-16 (×2): 1 mg via INTRAVENOUS

## 2016-01-16 SURGICAL SUPPLY — 18 items
BALLN LUTONIX 5X150X130 (BALLOONS) ×5
BALLN ULTRVRSE 2.5X300X150 (BALLOONS) ×5
BALLOON LUTONIX 5X150X130 (BALLOONS) ×3 IMPLANT
BALLOON ULTRVRSE 2.5X300X150 (BALLOONS) ×3 IMPLANT
CATH CXI SUPP ANG 4FR 135 (MICROCATHETER) ×3 IMPLANT
CATH CXI SUPP ANG 4FR 135CM (MICROCATHETER) ×5
CATH PIG 70CM (CATHETERS) ×5 IMPLANT
CATH VERT 100CM (CATHETERS) ×5 IMPLANT
DEVICE PRESTO INFLATION (MISCELLANEOUS) ×5 IMPLANT
DEVICE STARCLOSE SE CLOSURE (Vascular Products) ×5 IMPLANT
GLIDEWIRE ADV .035X260CM (WIRE) ×5 IMPLANT
PACK ANGIOGRAPHY (CUSTOM PROCEDURE TRAY) ×5 IMPLANT
SHEATH ANL2 6FRX45 HC (SHEATH) ×5 IMPLANT
SHEATH BRITE TIP 5FRX11 (SHEATH) ×5 IMPLANT
SYR MEDRAD MARK V 150ML (SYRINGE) ×5 IMPLANT
TUBING CONTRAST HIGH PRESS 72 (TUBING) ×5 IMPLANT
WIRE G V18X300CM (WIRE) ×5 IMPLANT
WIRE J 3MM .035X145CM (WIRE) ×5 IMPLANT

## 2016-01-16 NOTE — H&P (Signed)
  [facility] History & Physical Update  The patient was interviewed and re-examined.  The patient's previous History and Physical has been reviewed and is unchanged.  There is no change in the plan of care. We plan to proceed with the scheduled procedure.  Tonny Isensee, MD  01/16/2016, 8:53 AM

## 2016-01-16 NOTE — Op Note (Signed)
[facility] Percutaneous Study/Intervention Procedural Note   Date of Surgery: 01/16/2016  Surgeon(s):[name],[name]   Assistants:none  Pre-operative Diagnosis: PAD with ulceration and gangrene right foot  Post-operative diagnosis: Same  Procedure(s) Performed: 1. Ultrasound guidance for vascular access left femoral artery 2. Catheter placement into right peroneal artery, posterior tibial artery, and anterior tibial artery from left femoral approach 3. Aortogram and selective right lower extremity angiogram 4. Percutaneous transluminal angioplasty of right peroneal artery and tibioperoneal trunk with 2.5 mm diameter by 30 cm length angioplasty balloon 5. Percutaneous transluminal angioplasty of right distal superficial femoral artery and proximal popliteal artery with 5 mm diameter by 15 cm length Lutonix drug-coated angioplasty balloon  6. StarClose closure device left femoral artery  EBL: 25 cc  Contrast: 55 cc  Fluoro Time: 8.1 minutes  Moderate Conscious Sedation Time: approximately 55 minutes using 4 mg of Versed and 150 mcg of Fentanyl  Indications: Patient is a 55 y.o.male with gangrenous changes and ulceration on the right foot. The patient is brought in for angiography for further evaluation and potential treatment. Risks and benefits are discussed and informed consent is obtained  Procedure: The patient was identified and appropriate procedural time out was performed. The patient was then placed supine on the table and prepped and draped in the usual sterile fashion.Moderate conscious sedation was administered during a face to face encounter with the patient throughout the procedure with my supervision of the RN administering medicines and monitoring the patient's vital signs, pulse oximetry, telemetry and mental status throughout from the start of the procedure until the  patient was taken to the recovery room. Ultrasound was used to evaluate the left common femoral artery. It was patent . A digital ultrasound image was acquired. A Seldinger needle was used to access the left common femoral artery under direct ultrasound guidance and a permanent image was performed. A 0.035 J wire was advanced without resistance and a 5Fr sheath was placed. Pigtail catheter was placed into the aorta and an AP aortogram was performed. This demonstrated normal renal arteries and normal aorta and iliac segments without significant stenosis. I then crossed the aortic bifurcation and advanced to the right femoral head. Selective right lower extremity angiogram was then performed. This demonstrated  Moderate stenosis in the 55-60% range in the distal SFA, peroneal was the only distal runoff with multiple areas of greater than 70% stenosis, posterior tibial artery occluded in the midsegment did not reconstitute distally, anterior tibial artery had a long segment occlusion with faint reconstitution distally. The patient was systemically heparinized and a 6 [place] Ansell sheath was then placed over the [employer] wire. I then used a Kumpe catheter and the advantage wire to navigate into the distal popliteal artery. I then exchanged for a 0.018 wire and a CXI catheter. The CXI catheter was advanced into the mid posterior tibial artery and selective imaging was performed but I was unable to cross the occlusion distally and I did not see a distal target so this was abandoned after several minutes. Similarly, the CXI catheter was advanced into the anterior tibial artery but I was unable to cross the proximal occlusion which was well collateralized and did have some faint reconstituted distally on selective imaging. This was also abandoned after several minutes. The CXI catheter and 0.018 wire were used to advance into the distal peroneal artery. The diffusely diseased peroneal artery was treated with a  2.5 mm diameter by 30 cm length angioplasty balloon taken to the  distal peroneal artery just above the ankle and up through the tibioperoneal trunk. This was inflated to 10 atm 1 minute. Completion angiogram showed no greater than 20% residual stenosis with improved flow. I then turned my attention to the distal SFA and proximal popliteal artery for the moderate stenosis in this location. A 5 mm diameter by 15 cm length Lutonix drug-coated angioplasty balloon was inflated to 12 atm for 1 minute. Completion angiogram showed only about a 10-15% residual stenosis after angioplasty. I elected to terminate the procedure. The sheath was removed and StarClose closure device was deployed in the left femoral artery with excellent hemostatic result. The patient was taken to the recovery room in stable condition having tolerated the procedure well.  Findings:  Aortogram: normal aorta and iliac arteries, renals not well seen but no stenosis identified Right Lower Extremity: Moderate stenosis in the 55-60% range in the distal SFA, peroneal was the only distal runoff with multiple areas of greater than 70% stenosis, posterior tibial artery occluded in the midsegment did not reconstitute distally, anterior tibial artery had a long segment occlusion with faint reconstitution distally   Disposition: Patient was taken to the recovery room in stable condition having tolerated the procedure well.  Complications: None  [name],[name] 01/16/2016 12:53 PM  

## 2016-01-16 NOTE — Discharge Instructions (Signed)
Angiogram, Care After °Refer to this sheet in the next few weeks. These instructions provide you with information about caring for yourself after your procedure. Your health care provider may also give you more specific instructions. Your treatment has been planned according to current medical practices, but problems sometimes occur. Call your health care provider if you have any problems or questions after your procedure. °WHAT TO EXPECT AFTER THE PROCEDURE °After your procedure, it is typical to have the following: °· Bruising at the catheter insertion site that usually fades within 1-2 weeks. °· Blood collecting in the tissue (hematoma) that may be painful to the touch. It should usually decrease in size and tenderness within 1-2 weeks. °HOME CARE INSTRUCTIONS °· Take medicines only as directed by your health care provider. °· You may shower 24-48 hours after the procedure or as directed by your health care provider. Remove the bandage (dressing) and gently wash the site with plain soap and water. Pat the area dry with a clean towel. Do not rub the site, because this may cause bleeding. °· Do not take baths, swim, or use a hot tub until your health care provider approves. °· Check your insertion site every day for redness, swelling, or drainage. °· Do not apply powder or lotion to the site. °· Do not lift over 10 lb (4.5 kg) for 5 days after your procedure or as directed by your health care provider. °· Ask your health care provider when it is okay to: °¨ Return to work or school. °¨ Resume usual physical activities or sports. °¨ Resume sexual activity. °· Do not drive home if you are discharged the same day as the procedure. Have someone else drive you. °· You may drive 24 hours after the procedure unless otherwise instructed by your health care provider. °· Do not operate machinery or power tools for 24 hours after the procedure or as directed by your health care provider. °· If your procedure was done as an  outpatient procedure, which means that you went home the same day as your procedure, a responsible adult should be with you for the first 24 hours after you arrive home. °· Keep all follow-up visits as directed by your health care provider. This is important. °SEEK MEDICAL CARE IF: °· You have a fever. °· You have chills. °· You have increased bleeding from the catheter insertion site. Hold pressure on the site. °SEEK IMMEDIATE MEDICAL CARE IF: °· You have unusual pain at the catheter insertion site. °· You have redness, warmth, or swelling at the catheter insertion site. °· You have drainage (other than a small amount of blood on the dressing) from the catheter insertion site. °· The catheter insertion site is bleeding, and the bleeding does not stop after 30 minutes of holding steady pressure on the site. °· The area near or just beyond the catheter insertion site becomes pale, cool, tingly, or numb. °  °This information is not intended to replace advice given to you by your health care provider. Make sure you discuss any questions you have with your health care provider. °  °Document Released: 04/19/2005 Document Revised: 10/22/2014 Document Reviewed: 03/04/2013 °Elsevier Interactive Patient Education ©2016 Elsevier Inc. ° °

## 2016-01-17 ENCOUNTER — Encounter: Payer: Self-pay | Admitting: Vascular Surgery

## 2016-02-15 ENCOUNTER — Other Ambulatory Visit: Payer: Self-pay | Admitting: Vascular Surgery

## 2016-02-17 ENCOUNTER — Other Ambulatory Visit: Payer: Medicare Other

## 2016-02-22 ENCOUNTER — Ambulatory Visit: Admission: RE | Admit: 2016-02-22 | Payer: Medicare Other | Source: Ambulatory Visit | Admitting: Vascular Surgery

## 2016-02-22 ENCOUNTER — Encounter: Admission: RE | Payer: Self-pay | Source: Ambulatory Visit

## 2016-02-22 SURGERY — AMPUTATION DIGIT
Anesthesia: Choice | Laterality: Right

## 2016-03-08 ENCOUNTER — Other Ambulatory Visit
Admission: RE | Admit: 2016-03-08 | Discharge: 2016-03-08 | Disposition: A | Discharge status: Home / Self Care | Payer: Medicare Other | Source: Other Acute Inpatient Hospital | Attending: Internal Medicine | Admitting: Internal Medicine

## 2016-03-08 ENCOUNTER — Encounter: Payer: Self-pay | Admitting: Emergency Medicine

## 2016-03-08 ENCOUNTER — Emergency Department
Admission: EM | Admit: 2016-03-08 | Discharge: 2016-03-08 | Disposition: A | Discharge status: Home / Self Care | Payer: Medicare Other | Attending: Emergency Medicine | Admitting: Emergency Medicine

## 2016-03-08 DIAGNOSIS — Z992 Dependence on renal dialysis: Secondary | ICD-10-CM | POA: Diagnosis not present

## 2016-03-08 DIAGNOSIS — D631 Anemia in chronic kidney disease: Secondary | ICD-10-CM | POA: Insufficient documentation

## 2016-03-08 DIAGNOSIS — N186 End stage renal disease: Secondary | ICD-10-CM | POA: Insufficient documentation

## 2016-03-08 DIAGNOSIS — Z79899 Other long term (current) drug therapy: Secondary | ICD-10-CM | POA: Insufficient documentation

## 2016-03-08 DIAGNOSIS — Z7984 Long term (current) use of oral hypoglycemic drugs: Secondary | ICD-10-CM | POA: Diagnosis not present

## 2016-03-08 DIAGNOSIS — D649 Anemia, unspecified: Secondary | ICD-10-CM

## 2016-03-08 DIAGNOSIS — Z7982 Long term (current) use of aspirin: Secondary | ICD-10-CM | POA: Insufficient documentation

## 2016-03-08 LAB — COMPREHENSIVE METABOLIC PANEL
ALK PHOS: 176 U/L — AB (ref 38–126)
ALT: 18 U/L (ref 17–63)
ANION GAP: 11 (ref 5–15)
AST: 27 U/L (ref 15–41)
Albumin: 2.9 g/dL — ABNORMAL LOW (ref 3.5–5.0)
BILIRUBIN TOTAL: 0.6 mg/dL (ref 0.3–1.2)
BUN: 25 mg/dL — ABNORMAL HIGH (ref 6–20)
CALCIUM: 7.7 mg/dL — AB (ref 8.9–10.3)
CO2: 33 mmol/L — ABNORMAL HIGH (ref 22–32)
Chloride: 97 mmol/L — ABNORMAL LOW (ref 101–111)
Creatinine, Ser: 5.28 mg/dL — ABNORMAL HIGH (ref 0.61–1.24)
GFR, EST AFRICAN AMERICAN: 13 mL/min — AB (ref >60)
GFR, EST NON AFRICAN AMERICAN: 11 mL/min — AB (ref >60)
GLUCOSE: 61 mg/dL — AB (ref 65–99)
Potassium: 4.2 mmol/L (ref 3.5–5.1)
Sodium: 141 mmol/L (ref 135–145)
TOTAL PROTEIN: 7.1 g/dL (ref 6.5–8.1)

## 2016-03-08 LAB — ABO/RH: ABO/RH(D): O POS

## 2016-03-08 LAB — CBC
HCT: 21.2 % — ABNORMAL LOW (ref 40.0–52.0)
HEMOGLOBIN: 6.4 g/dL — AB (ref 13.0–18.0)
MCH: 28 pg (ref 26.0–34.0)
MCHC: 30.4 g/dL — AB (ref 32.0–36.0)
MCV: 92.1 fL (ref 80.0–100.0)
Platelets: 520 10*3/uL — ABNORMAL HIGH (ref 150–440)
RBC: 2.3 MIL/uL — AB (ref 4.40–5.90)
RDW: 19.6 % — ABNORMAL HIGH (ref 11.5–14.5)
WBC: 16.3 10*3/uL — AB (ref 3.8–10.6)

## 2016-03-08 LAB — HEMOGLOBIN: Hemoglobin: 5.7 g/dL — ABNORMAL LOW (ref 13.0–18.0)

## 2016-03-08 LAB — PREPARE RBC (CROSSMATCH)

## 2016-03-08 MED ORDER — SODIUM CHLORIDE 0.9 % IV SOLN
10.0000 mL/h | Freq: Once | INTRAVENOUS | Status: AC
  Administered 2016-03-08: 10 mL/h via INTRAVENOUS

## 2016-03-08 NOTE — ED Notes (Signed)
POC hemoccult collected by Dr. Karma Greaser

## 2016-03-08 NOTE — Discharge Instructions (Signed)
Please follow up with your kidney doctor at the next available opportunity to discuss your long-term anemia.   Anemia inespecfica (Anemia, Nonspecific) La anemia es una enfermedad en la que la concentracin de glbulos rojos o el nivel de hemoglobina en la sangre estn por debajo de lo normal. La hemoglobina es la sustancia de los glbulos rojos que lleva el oxgeno a todo el cuerpo. La anemia da como resultado que los tejidos no reciban la cantidad suficiente de oxgeno.  CAUSAS  Las causas ms frecuentes de anemia son:   Elvina Mattes. El sangrado puede ser interno o externo. Incluye sangrado excesivo debido al perodo (en las mujeres) o por los intestinos.   Dficit nutricional.   Enfermedad renal, tiroidea o heptica crnicas.  Enfermedades de la mdula sea que disminuyen la produccin de glbulos rojos.  Cncer y tratamientos para Science writer.  VIH, sida y sus tratamientos.  Trastornos del bazo que aumentan la destruccin de glbulos rojos.  Enfermedades de [employer].  Destruccin excesiva de glbulos rojos debido a una infeccin, a medicamentos y a Nurse, mental health. SIGNOS Y SNTOMAS   Debilidad leve.   Mareos.   Dolor de [place].  Palpitaciones.   Falta de aire, especialmente con el ejercicio.   Palidez.  Sensibilidad al fro.  Indigestin.  Nuseas.  Dificultad para dormir.  Dificultad para concentrarse. Los sntomas pueden ocurrir repentinamente o pueden Psychologist, forensic.  DIAGNSTICO  Con frecuencia es necesario realizar anlisis de [employer]. Estos ayudan al profesional a Adult nurse. Su mdico controlar la materia fecal para Hydrographic surveyor la presencia de [place] y buscar otras causas de prdida de [place].  TRATAMIENTO  El tratamiento vara segn la causa de la anemia. Las opciones de tratamiento son:   Suplementos de hierro, vitamina [id number], o cido flico.   Medicamentos con hormonas.    Transfusin de [place]. Ser necesaria en los casos de prdida de [place] grave.   Hospitalizacin. Ser necesaria si la prdida de sangre es continua y significativa.   Cambios en la dieta.  Extirpacin del bazo. INSTRUCCIONES PARA EL CUIDADO EN EL HOGAR Cumpla con todas las visitas de control. Generalmente demora varias semanas corregir la anemia, y es muy importante que el mdico controle su enfermedad y su respuesta al [place]. SOLICITE ATENCIN MDICA DE INMEDIATO SI:   Siente debilidad extrema, falta de aire o dolor en el pecho.   Se siente mareado o tiene dificultad para concentrarse.  Tiene una hemorragia vaginal abundante.   Aparece una erupcin cutnea.   La materia fecal es negra, de aspecto alquitranado.   Se desmaya.   Vomita sangre.   Vomita repetidas veces.   Siente dolor abdominal.  Tiene fiebre o sntomas persistentes durante ms de 2 - 3 das.   Tiene fiebre y los sntomas empeoran repentinamente.   Se deshidrata.  ASEGRESE DE QUE:  Comprende estas instrucciones.  Controlar su afeccin.  Recibir ayuda de inmediato si no mejora o si empeora.   Esta informacin no tiene Marine scientist el consejo del mdico. Asegrese de hacerle al mdico cualquier pregunta que tenga.   Document Released: 10/01/2005 Document Revised: 06/03/2013 Elsevier Interactive Patient Education 2016 [place]  (Blood Transfusion) Ardelia Mems transfusin de [place] es un procedimiento en el cual se recibe sangre a travs de una va intravenosa. Puede necesitar una transfusin de sangre por una enfermedad, [place] o lesin. La sangre puede provenir de un donante o puede ser su propia sangre donada previamente. Rexanne Mano  administrada en una transfusin se compone de diferentes tipos de clulas. Puede recibir lo siguiente:  Glbulos rojos. Estos transportan oxgeno y Optician, dispensing perdida.  Plaquetas. Estas controlan el  sangrado.  Plasma. Este ayuda a la coagulacin sangunea. Si tiene hemofilia u otro trastorno de [place], tambin puede recibir otro tipo de hemoderivados. INFORME A SU MDICO:  Cualquier alergia que tenga.  Todos los [employer], incluidos vitaminas, hierbas, gotas oftlmicas, cremas y medicamentos de venta libre.  Problemas previos que usted o los [employer] de su familia hayan tenido con el uso de anestsicos.  Enfermedades de la sangre que tenga.  Si tiene cirugas previas.  Si tiene [employer].  Cualquier reaccin previa que haya tenido durante una transfusin sangunea.  RIESGOS Y COMPLICACIONES En general, se trata de un procedimiento seguro. Sin embargo, pueden presentarse problemas, por ejemplo:  Nurse, mental health a algn componente de la sangre donada.  Cristy Hilts. Esta puede ser Ardelia Mems reaccin a los glbulos blancos de la sangre transfundida.  Sobrecarga de hierro. Esto puede suceder por haber recibido muchas transfusiones.  Lesin pulmonar aguda relacionada con la transfusin (LPART). Esta es una reaccin poco frecuente que causa dao pulmonar. La causa no se conoce.Esta lesin puede ocurrir unas horas o varios das despus de la transfusin.  Reacciones hemolticas repentinas (agudas) o lentas. Esto sucede si la sangre no es compatible con las clulas de la transfusin. El sistema de defensa del cuerpo (sistema inmunitario) puede tratar de atacar a las clulas nuevas. Esta complicacin es poco frecuente.  Infeccin. Esto es raro. ANTES DEL PROCEDIMIENTO  Es posible que le realicen un anlisis de sangre para determinar su tipo de [place]. Esto es necesario para saber qu clase de sangre aceptar el cuerpo.  Si planifica someterse a [place], puede donar su propia sangre. Esto es en caso de que necesite una transfusin.  Si tuvo una reaccin alrgica a una transfusin en el pasado, tal vez le administren un medicamento que ayude a evitarla.  Tome este medicamento solamente como se lo haya indicado el mdico.  Le controlarn la temperatura, la presin arterial y el pulso antes de la transfusin. PROCEDIMIENTO   Le insertarn una va intravenosa en el brazo o la [place].  La bolsa de sangre donada se conectar a la va intravenosa y Pharmacist, community en la vena.  Le controlarn con frecuencia la temperatura, la presin arterial y el pulso durante de la transfusin. Este control se realiza para detectar signos tempranos de una reaccin a la transfusin.  Si tiene signos o sntomas de Hospital doctor, se suspender la transfusin y tal vez le administren un medicamento.  Cuando finalice la transfusin, le retirarn la va intravenosa.  Se puede aplicar presin en el lugar donde se coloc la va intravenosa.  Le colocarn una venda (vendaje). Este procedimiento puede variar segn el mdico y el hospital. DESPUS DEL PROCEDIMIENTO  Le controlarn la presin arterial, la temperatura y el pulso peridicamente.   Esta informacin no tiene Marine scientist el consejo del mdico. Asegrese de hacerle al mdico cualquier pregunta que tenga.   Document Released: 10/01/2005 Document Revised: 10/22/2014 Elsevier Interactive Patient Education 2016 [place], cuidados posteriores (Blood Transfusion, Care After) [place] prximas semanas. Estas indicaciones le proporcionan informacin acerca de cmo deber cuidarse despus del procedimiento. El mdico tambin podr darle instrucciones ms especficas. El tratamiento ha sido planificado segn las prcticas mdicas actuales, pero en algunos casos pueden ocurrir problemas.  Comunquese con el mdico si tiene algn problema o tiene dudas despus del procedimiento. QU ESPERAR DESPUS DEL PROCEDIMIENTO Despus del procedimiento, es comn [employer] siguientes sntomas:  Hematomas y Management consultant donde se coloc la va intravenosa  (IV).  Fiebre o escalofros.  Dolor de [place]. [place] los medicamentos solamente como se lo haya indicado el mdico. Pregntele al mdico si puede tomar un analgsico de venta libre si tiene Systems analyst o Social research officer, government de cabeza uno o dos das despus de la transfusin.  Reanude sus actividades normales como se lo haya indicado el mdico. SOLICITE ATENCIN MDICA SI:   Presenta enrojecimiento o irritacin en el lugar donde se coloc la va intravenosa.  Tiene fiebre, escalofros o dolor de cabeza persistentes.  La orina es ms oscura que lo normal.  La orina se vuelve rosa, roja o [place].   La parte blanca de los ojos se vuelve amarilla (ictericia).   Se siente dbil despus de realizar sus actividades habituales.  SOLICITE ATENCIN MDICA DE INMEDIATO SI:   Tiene dificultad para respirar.  Jaclynn Guarneri y escalofros con:  Ansiedad.  Dolor de pecho o de espalda.  Piel irritada.  Piel hmeda.  Frecuencia cardaca rpida.  Nuseas.   Esta informacin no tiene Marine scientist el consejo del mdico. Asegrese de hacerle al mdico cualquier pregunta que tenga.   Document Released: 10/22/2014 Elsevier Interactive Patient Education [employer].

## 2016-03-08 NOTE — ED Notes (Signed)
Pink sleeve applied to left arm.

## 2016-03-08 NOTE — ED Notes (Signed)
Pt states that he was called by his dialysis facility and notified that his hemaglobin was 5.7 and his doctor recommended he be seen in the ED.  VSS.  Pt with 99.6 temp.

## 2016-03-08 NOTE — ED Provider Notes (Signed)
[facility] Emergency Department Provider Note  ____________________________________________  Time seen: Approximately 4:34 PM  I have reviewed the triage vital signs and the nursing notes.   HISTORY  Chief Complaint No chief complaint on file.  The patient and/or family speak(s) Spanish.  We utilized the hospital interpreter for our discussion.   HPI [name] is a 55 y.o. 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  They informed him that his hemoglobin is 5.7 and that he should go to the emergency department.  He reports that he has had a low blood count in the past and years ago when he lived in [place] he required a blood transfusion.  He states that he takes iron supplements and that his stools are always dark.  He has not been feeling weak or dizzy or fatigued any more than usual recently.  He denies fever/chills, chest pain, shortness of breath, nausea, vomiting, diarrhea, abdominal pain.  He is in good spirits, alert and oriented, and in no acute distress.  His severity of anemia is severe at 5.7 but he appears to be asymptomatic in spite of the anemia.  The onset has been gradual.   Past Medical History  Diagnosis Date  . Chronic kidney disease     There are no active problems to display for this patient.   Past Surgical History  Procedure Laterality Date  . Av fistula placement Left 2015  . Peripheral vascular catheterization N/A 10/03/2015    Procedure: A/V Shuntogram/Fistulagram;  Surgeon: [name], MD;  Location: [place] [facility];  Service: Cardiovascular;  Laterality: N/A;  . Peripheral vascular catheterization N/A 01/16/2016    Procedure: Abdominal Aortogram w/Lower Extremity;  Surgeon: [name], MD;  Location: [place] [facility];  Service: Cardiovascular;  Laterality: N/A;  . Peripheral vascular catheterization   01/16/2016    Procedure: Lower Extremity Intervention;  Surgeon: [name], MD;  Location: [place] [facility];  Service: Cardiovascular;;  . Leg amputation below knee Left     Current Outpatient Rx  Name  Route  Sig  Dispense  Refill  . aspirin 325 MG tablet   Oral   Take 325 mg by mouth daily.         . cinacalcet (SENSIPAR) 30 MG tablet   Oral   Take 60 mg by mouth daily.          Marland Kitchen glipiZIDE (GLUCOTROL) 10 MG tablet   Oral   Take 10 mg by mouth daily before breakfast.         . midodrine (PROAMATINE) 10 MG tablet   Oral   Take 10 mg by mouth 3 (three) times daily.         . sevelamer carbonate (RENVELA) 800 MG tablet   Oral   Take 800 mg by mouth 3 (three) times daily with meals.           Allergies Review of patient's allergies indicates no known allergies.  No family history on file.  Social History Social History  Substance Use Topics  . Smoking status: Never Smoker   . Smokeless tobacco: None  . Alcohol Use: No    Review of Systems Constitutional: No fever/chills Eyes: No visual changes. ENT: No sore throat. Cardiovascular: Denies chest pain. Respiratory: Denies shortness of breath. Gastrointestinal: No abdominal pain.  No nausea, no vomiting.  No diarrhea.  No constipation. Genitourinary: Negative for dysuria. Musculoskeletal: Negative for back pain. Skin: Negative for rash. Neurological: Negative for headaches, focal weakness or numbness.  10-point ROS otherwise negative.  ____________________________________________   PHYSICAL EXAM:  VITAL SIGNS: ED Triage Vitals  Enc Vitals Group     BP 03/08/16 1554 121/56 mmHg     Pulse Rate 03/08/16 1554 91     Resp 03/08/16 1554 18     Temp 03/08/16 1554 99.6 F (37.6 C)     Temp Source 03/08/16 1554 Oral     SpO2 03/08/16 1554 98 %     Weight 03/08/16 1554 211 lb (95.709 kg)     Height 03/08/16 1554 5\' 7"  (1.702 m)     Head Cir --      Peak Flow --      Pain Score --      Pain Loc  --      Pain Edu? --      Excl. in [place]? --     Constitutional: Alert and oriented. Well appearing and in no acute distress. Eyes: Conjunctivae are normal. PERRL. EOMI. Head: Atraumatic. Nose: No congestion/rhinnorhea. Mouth/Throat: Mucous membranes are moist.  Oropharynx non-erythematous. Neck: No stridor.  No meningeal signs.   Cardiovascular: Normal rate, regular rhythm. Good peripheral circulation. Grossly normal heart sounds.  Fistula in LUE Respiratory: Normal respiratory effort.  No retractions. Lungs CTAB. Gastrointestinal: Soft and nontender. No distention.  Rectal:  Non-tender, minimal light brown stool in rectal vault, heme negative, QC passed Musculoskeletal: No lower extremity tenderness nor edema. No gross deformities of extremities. Neurologic:  Normal speech and language. No gross focal neurologic deficits are appreciated.  Skin:  Skin is warm, dry and intact. No rash noted. Psychiatric: Mood and affect are normal. Speech and behavior are normal.  ____________________________________________   LABS (all labs ordered are listed, but only abnormal results are displayed)  Labs Reviewed  COMPREHENSIVE METABOLIC PANEL  CBC  POC OCCULT BLOOD, ED  TYPE AND SCREEN   ____________________________________________  EKG  None ____________________________________________  RADIOLOGY   No results found.  ____________________________________________   PROCEDURES  Procedure(s) performed: None  Critical Care performed: No ____________________________________________   INITIAL IMPRESSION / ASSESSMENT AND PLAN / ED COURSE  Pertinent labs & imaging results that were available during my care of the patient were reviewed by me and considered in my medical decision making (see chart for details).  The patient has significant anemia with an outpatient hemoglobin of less than 6 and the current hemoglobin here of 6.4.  He is somewhat unclear about whether he feels weak or  dizzy but it sounds like this is a very long onset chronic anemia that has gotten acutely worse to the point that his nephrologist at [facility] sent him to the emergency department.  However, he is guaiac negative and wants to go home.  I called and spoke with Dr. Holley Raring and explained the situation to him and he agrees with my plan to provide one unit of blood in the emergency department and have him follow-up as an outpatient.  I consented the patient with my usual customary discussion and he will be discharge at the completion of the transfusion.   ____________________________________________  FINAL CLINICAL IMPRESSION(S) / ED DIAGNOSES  Final diagnoses:  None     MEDICATIONS GIVEN DURING THIS VISIT:  Medications - No data to display   NEW OUTPATIENT MEDICATIONS STARTED DURING THIS VISIT:  New Prescriptions   No medications on file  Note:  This document was prepared using Dragon voice recognition software and may include unintentional dictation errors.   Hinda Kehr, MD 03/08/16 2018

## 2016-03-08 NOTE — ED Notes (Signed)
Interpreter Jaqueline at bedside for assessments.

## 2016-03-08 NOTE — ED Notes (Signed)
Interpreter paged for blood consent.

## 2016-03-09 LAB — TYPE AND SCREEN
ABO/RH(D): O POS
ANTIBODY SCREEN: NEGATIVE
UNIT DIVISION: 0

## 2016-04-23 ENCOUNTER — Encounter
Admission: RE | Admit: 2016-04-23 | Discharge: 2016-04-23 | Disposition: A | Discharge status: Home / Self Care | Payer: Medicare Other | Source: Ambulatory Visit | Attending: Vascular Surgery | Admitting: Vascular Surgery

## 2016-04-23 DIAGNOSIS — Z01810 Encounter for preprocedural cardiovascular examination: Secondary | ICD-10-CM

## 2016-04-23 DIAGNOSIS — E785 Hyperlipidemia, unspecified: Secondary | ICD-10-CM

## 2016-04-23 DIAGNOSIS — Z01812 Encounter for preprocedural laboratory examination: Secondary | ICD-10-CM

## 2016-04-23 DIAGNOSIS — I70261 Atherosclerosis of native arteries of extremities with gangrene, right leg: Secondary | ICD-10-CM | POA: Insufficient documentation

## 2016-04-23 DIAGNOSIS — E1122 Type 2 diabetes mellitus with diabetic chronic kidney disease: Secondary | ICD-10-CM | POA: Insufficient documentation

## 2016-04-23 DIAGNOSIS — I129 Hypertensive chronic kidney disease with stage 1 through stage 4 chronic kidney disease, or unspecified chronic kidney disease: Secondary | ICD-10-CM

## 2016-04-23 DIAGNOSIS — I639 Cerebral infarction, unspecified: Secondary | ICD-10-CM

## 2016-04-23 HISTORY — DX: Essential (primary) hypertension: I10

## 2016-04-23 HISTORY — DX: Unspecified atherosclerosis: I70.90

## 2016-04-23 HISTORY — DX: Pure hypercholesterolemia, unspecified: E78.00

## 2016-04-23 HISTORY — DX: Cerebral infarction, unspecified: I63.9

## 2016-04-23 LAB — SURGICAL PCR SCREEN
MRSA, PCR: NEGATIVE
Staphylococcus aureus: NEGATIVE

## 2016-04-23 LAB — CBC WITH DIFFERENTIAL/PLATELET
BASOS ABS: 0.1 10*3/uL (ref 0–0.1)
Basophils Relative: 1 %
Eosinophils Absolute: 0.9 10*3/uL — ABNORMAL HIGH (ref 0–0.7)
Eosinophils Relative: 8 %
HEMATOCRIT: 25.3 % — AB (ref 40.0–52.0)
Hemoglobin: 8 g/dL — ABNORMAL LOW (ref 13.0–18.0)
LYMPHS PCT: 10 %
Lymphs Abs: 1 10*3/uL (ref 1.0–3.6)
MCH: 27.6 pg (ref 26.0–34.0)
MCHC: 31.6 g/dL — ABNORMAL LOW (ref 32.0–36.0)
MCV: 87.4 fL (ref 80.0–100.0)
MONO ABS: 0.9 10*3/uL (ref 0.2–1.0)
Monocytes Relative: 9 %
NEUTROS ABS: 7.9 10*3/uL — AB (ref 1.4–6.5)
Neutrophils Relative %: 72 %
Platelets: 308 10*3/uL (ref 150–440)
RBC: 2.89 MIL/uL — AB (ref 4.40–5.90)
RDW: 20.8 % — ABNORMAL HIGH (ref 11.5–14.5)
WBC: 10.9 10*3/uL — AB (ref 3.8–10.6)

## 2016-04-23 LAB — BASIC METABOLIC PANEL
ANION GAP: 12 (ref 5–15)
BUN: 45 mg/dL — ABNORMAL HIGH (ref 6–20)
CALCIUM: 7.8 mg/dL — AB (ref 8.9–10.3)
CO2: 33 mmol/L — ABNORMAL HIGH (ref 22–32)
Chloride: 99 mmol/L — ABNORMAL LOW (ref 101–111)
Creatinine, Ser: 6.87 mg/dL — ABNORMAL HIGH (ref 0.61–1.24)
GFR, EST AFRICAN AMERICAN: 9 mL/min — AB (ref >60)
GFR, EST NON AFRICAN AMERICAN: 8 mL/min — AB (ref >60)
Glucose, Bld: 180 mg/dL — ABNORMAL HIGH (ref 65–99)
POTASSIUM: 4.2 mmol/L (ref 3.5–5.1)
SODIUM: 144 mmol/L (ref 135–145)

## 2016-04-23 LAB — PROTIME-INR
INR: 1.31
Prothrombin Time: 16.4 seconds — ABNORMAL HIGH (ref 11.4–15.0)

## 2016-04-23 LAB — APTT: APTT: 26 s (ref 24–36)

## 2016-04-23 LAB — TYPE AND SCREEN
ABO/RH(D): O POS
ANTIBODY SCREEN: NEGATIVE
EXTEND SAMPLE REASON: TRANSFUSED

## 2016-04-23 NOTE — Patient Instructions (Signed)
  Your procedure is scheduled on: 04/25/16 Report to Day Surgery. To find out your arrival time please call [phone] between 1PM - 3PM on 04/24/16.  Remember: Instructions that are not followed completely may result in serious medical risk, up to and including death, or upon the discretion of your surgeon and anesthesiologist your surgery may need to be rescheduled.    __x__ 1. Do not eat food or drink liquids after midnight. No gum chewing or hard candies.     __x__ 2. No Alcohol for 24 hours before or after surgery.   ____ 3. Do Not Smoke For 24 Hours Prior to Your Surgery.   ____ 4. Bring all medications with you on the day of surgery if instructed.    __x__ 5. Notify your doctor if there is any change in your medical condition     (cold, fever, infections).       Do not wear jewelry, make-up, hairpins, clips or nail polish.  Do not wear lotions, powders, or perfumes. You may wear deodorant.  Do not shave 48 hours prior to surgery. Men may shave face and neck.  Do not bring valuables to the hospital.    [facility] is not responsible for any belongings or valuables.               Contacts, dentures or bridgework may not be worn into surgery.  Leave your suitcase in the car. After surgery it may be brought to your room.  For patients admitted to the hospital, discharge time is determined by your                treatment team.   Patients discharged the day of surgery will not be allowed to drive home.   Please read over the following fact sheets that you were given:   Surgical Site Infection Prevention   ___ Take these medicines the morning of surgery with A SIP OF WATER:    1.   2.   3.   4.  5.  6.  ____ Fleet Enema (as directed)   __x__ Use CHG Soap as directed  ____ Use inhalers on the day of surgery  ____ Stop metformin 2 days prior to surgery    ____ Take 1/2 of usual insulin dose the night before surgery and none on the morning of surgery.   ____ Stop  Coumadin/Plavix/aspirin on  ____ Stop Anti-inflammatories on   ____ Stop supplements until after surgery.    ____ Bring C-Pap to the hospital.

## 2016-04-23 NOTE — Pre-Procedure Instructions (Signed)
Procedure:        Transthoracic Echocardiogram     Procedure Date:   03/19/2014                       Patient:          [name], [name] Pt. Type:         Inpatient                        MRN#:             [id number]                      Accession#:       [id number] [id number]                       DOB(Age):         1961-07-30(52)                   Sex:              Male                             Height:           170.2(cm)                        Weight:           122.47(kg) BSA.:             2.3  Physicians: Interpreting:     CORMED                           Interpreting:     Maud Deed, MD               Referring         Taha M.D, [facility]       [place], [place] RCS              Rhythm: NSR H.R.          BP 85            99/64     Study Type(s):  Transthoracic Echocardiogram Indication(s):  Arrythmia         Arrythmia (427.9)       2D Measurements                     Value          Units (Range)        IVSd                1.3            cm (0.6 - 1.1)       LVIDd               5.4            cm (3.5 - 5.7)       LVIDs               4.6            cm (2.5 - 4)  LVFS                16             % (20 - 40)          LVPWd               0.9            cm (0.6 - 1.1)       LA Diam             4.5            cm (1.5 - 4)          MM Measurements                     Value          Units (Range)        ACS_MM              3.2            cm                   LADim_MM            4.5            cm                   AODIA MM            3.2            cm                   LA/AO ratio MM      1.43           Ratio                 Aortic Valve                     Value          Units (Range)        AV Pk Vel           1.48           m/sec (1 - 1.7)      AV Pk Grad          8.86           mmHg (<36)           LVOT Pk Vel         0.93           m/sec (0.7 - 1.1)    LVOT Pk Grad        3.48           mmHg                  Mitral Valve     Value          Units (Range)        E Vel               1.04           m/sec                A Vel               0.97           m/sec  MVDecSlope          480            cm/sec2              E/A                 1.07           ratio (1.1 - 1.5)    IVRT                89             msec                 E1                  0.03           m/sec                 Tricuspid/Pulmonic Valves                     Value          Units (Range)        PV Pk Vel           1.32           cm/s (0.6 - 0.9)     PV Pk Grad          7              mmHg                   FINDINGS:      Technical Comments: The study quality is fair.    Left Ventricle: The left ventricular chamber size is normal. Septal wall hypertrophy is observed.  (Diastology not reported due to arrythmia.) There is normal left ventricular systolic function.  (Frequent ectopic beats, LV function varies with R-R interval. Endocardial border definition not well visualized, cannot exclude small wall motion abnormalities.) EJECTION FRACTION (EF) is 55%.    Left Atrium: The left atrial chamber size is normal.   Right Ventricle: The right ventricle is mildly dilated.    Right Atrium: The right atrial cavity size is normal.   Aortic Valve: The aortic valve is trileaflet. Moderate aortic cusp sclerosis is present. There is no evidence of aortic regurgitation via color flow Doppler. There is no evidence of aortic stenosis.   Mitral Valve: The mitral valve leaflets appear normal. There is a trace of mitral regurgitation via color flow Doppler.   Tricuspid Valve: The tricuspid valve leaflets are normal.  There is trace tricuspid regurgitation via color flow Doppler.    Pulmonic Valve: The pulmonic valve is not well visualized.   Pericardium: There is no pericardial effusion.   Aorta: The aorta appears normal.    Pulmonary Artery: The main pulmonary artery appears normal.   Venous: The venous system  appears normal.   Conclusions: 1. The left ventricular chamber size is normal. 2. Septal wall hypertrophy is observed.Diastology not reported due to arrythmia. 3. There is normal left ventricular systolic function.Frequent ectopic beats, LV function varies with R-R interval. Endocardial border definition not well visualized, cannot exclude small wall motion abnormalities. 4. EJECTION FRACTION (EF) is 55%.  5. The left atrial chamber size is normal. 6. The right ventricle is mildly dilated.  7. The right atrial cavity size is normal. 8. Moderate aortic cusp sclerosis is present. 9. There is no evidence  of aortic regurgitation via color flow Doppler. 10. There is no evidence of aortic stenosis. 11. There is a trace of mitral regurgitation via color flow Doppler. 12. There is trace tricuspid regurgitation via color flow Doppler.     Final report has been electronically signed by: _______________________________ Maud Deed, MD     03/19/2014 17:49:47   ECG 12 lead6/12/2013  [place] and [place]  Component Name Value Range  VENTRICULAR RATE EKG 88 BPM   ATRIAL RATE 88 BPM   P-R INTERVAL 162 ms  QRS DURATION 104 ms  Q-T INTERVAL 402 ms  Q-T INTERVAL (CORRECTED) 486 ms  P WAVE AXIS 20 degrees   QRS AXIS 71 degrees   T AXIS 108 degrees   INTERPRETATION TEXT NORMAL SINUS RHYTHM LOW VOLTAGE QRS NONSPECIFIC ST AND T WAVE ABNORMALITY PROLONGED QT ABNORMAL ECG NO PREVIOUS ECGS AVAILABLE

## 2016-04-23 NOTE — Pre-Procedure Instructions (Signed)
Spanish interpreter here for preop  interview

## 2016-04-24 ENCOUNTER — Other Ambulatory Visit: Payer: Self-pay | Admitting: Vascular Surgery

## 2016-04-25 ENCOUNTER — Ambulatory Visit: Payer: Medicare Other | Admitting: Certified Registered Nurse Anesthetist

## 2016-04-25 ENCOUNTER — Encounter: Payer: Self-pay | Admitting: *Deleted

## 2016-04-25 ENCOUNTER — Inpatient Hospital Stay
Admission: RE | Admit: 2016-04-25 | Discharge: 2016-05-02 | DRG: 270 | Disposition: A | Discharge status: Skilled Nursing Facility | Payer: Medicare Other | Source: Ambulatory Visit | Attending: Internal Medicine | Admitting: Internal Medicine

## 2016-04-25 ENCOUNTER — Encounter
Admission: RE | Disposition: A | Discharge status: Skilled Nursing Facility | Payer: Self-pay | Source: Ambulatory Visit | Attending: Internal Medicine

## 2016-04-25 DIAGNOSIS — I2510 Atherosclerotic heart disease of native coronary artery without angina pectoris: Secondary | ICD-10-CM | POA: Diagnosis present

## 2016-04-25 DIAGNOSIS — R9431 Abnormal electrocardiogram [ECG] [EKG]: Secondary | ICD-10-CM | POA: Diagnosis not present

## 2016-04-25 DIAGNOSIS — E1122 Type 2 diabetes mellitus with diabetic chronic kidney disease: Secondary | ICD-10-CM | POA: Diagnosis present

## 2016-04-25 DIAGNOSIS — I469 Cardiac arrest, cause unspecified: Secondary | ICD-10-CM | POA: Diagnosis present

## 2016-04-25 DIAGNOSIS — J9600 Acute respiratory failure, unspecified whether with hypoxia or hypercapnia: Secondary | ICD-10-CM

## 2016-04-25 DIAGNOSIS — E7800 Pure hypercholesterolemia, unspecified: Secondary | ICD-10-CM | POA: Diagnosis present

## 2016-04-25 DIAGNOSIS — Z7982 Long term (current) use of aspirin: Secondary | ICD-10-CM

## 2016-04-25 DIAGNOSIS — I70261 Atherosclerosis of native arteries of extremities with gangrene, right leg: Secondary | ICD-10-CM | POA: Diagnosis present

## 2016-04-25 DIAGNOSIS — D631 Anemia in chronic kidney disease: Secondary | ICD-10-CM | POA: Diagnosis present

## 2016-04-25 DIAGNOSIS — B965 Pseudomonas (aeruginosa) (mallei) (pseudomallei) as the cause of diseases classified elsewhere: Secondary | ICD-10-CM | POA: Diagnosis present

## 2016-04-25 DIAGNOSIS — I493 Ventricular premature depolarization: Secondary | ICD-10-CM | POA: Diagnosis present

## 2016-04-25 DIAGNOSIS — E1152 Type 2 diabetes mellitus with diabetic peripheral angiopathy with gangrene: Principal | ICD-10-CM | POA: Diagnosis present

## 2016-04-25 DIAGNOSIS — Z89421 Acquired absence of other right toe(s): Secondary | ICD-10-CM

## 2016-04-25 DIAGNOSIS — N186 End stage renal disease: Secondary | ICD-10-CM | POA: Diagnosis present

## 2016-04-25 DIAGNOSIS — I255 Ischemic cardiomyopathy: Secondary | ICD-10-CM | POA: Diagnosis present

## 2016-04-25 DIAGNOSIS — E785 Hyperlipidemia, unspecified: Secondary | ICD-10-CM | POA: Diagnosis present

## 2016-04-25 DIAGNOSIS — Z452 Encounter for adjustment and management of vascular access device: Secondary | ICD-10-CM

## 2016-04-25 DIAGNOSIS — N2581 Secondary hyperparathyroidism of renal origin: Secondary | ICD-10-CM | POA: Diagnosis present

## 2016-04-25 DIAGNOSIS — Z6839 Body mass index (BMI) 39.0-39.9, adult: Secondary | ICD-10-CM | POA: Diagnosis not present

## 2016-04-25 DIAGNOSIS — Z7984 Long term (current) use of oral hypoglycemic drugs: Secondary | ICD-10-CM

## 2016-04-25 DIAGNOSIS — I120 Hypertensive chronic kidney disease with stage 5 chronic kidney disease or end stage renal disease: Secondary | ICD-10-CM | POA: Diagnosis present

## 2016-04-25 DIAGNOSIS — I2582 Chronic total occlusion of coronary artery: Secondary | ICD-10-CM | POA: Diagnosis present

## 2016-04-25 DIAGNOSIS — I70202 Unspecified atherosclerosis of native arteries of extremities, left leg: Secondary | ICD-10-CM | POA: Diagnosis present

## 2016-04-25 DIAGNOSIS — Z89512 Acquired absence of left leg below knee: Secondary | ICD-10-CM | POA: Diagnosis not present

## 2016-04-25 DIAGNOSIS — I2584 Coronary atherosclerosis due to calcified coronary lesion: Secondary | ICD-10-CM | POA: Diagnosis present

## 2016-04-25 DIAGNOSIS — I472 Ventricular tachycardia: Secondary | ICD-10-CM | POA: Diagnosis not present

## 2016-04-25 DIAGNOSIS — Z992 Dependence on renal dialysis: Secondary | ICD-10-CM | POA: Diagnosis not present

## 2016-04-25 DIAGNOSIS — I462 Cardiac arrest due to underlying cardiac condition: Secondary | ICD-10-CM | POA: Diagnosis not present

## 2016-04-25 DIAGNOSIS — Z833 Family history of diabetes mellitus: Secondary | ICD-10-CM

## 2016-04-25 DIAGNOSIS — B9620 Unspecified Escherichia coli [E. coli] as the cause of diseases classified elsewhere: Secondary | ICD-10-CM | POA: Diagnosis present

## 2016-04-25 DIAGNOSIS — Z8673 Personal history of transient ischemic attack (TIA), and cerebral infarction without residual deficits: Secondary | ICD-10-CM

## 2016-04-25 DIAGNOSIS — I70269 Atherosclerosis of native arteries of extremities with gangrene, unspecified extremity: Secondary | ICD-10-CM | POA: Diagnosis present

## 2016-04-25 DIAGNOSIS — R059 Cough, unspecified: Secondary | ICD-10-CM

## 2016-04-25 DIAGNOSIS — R05 Cough: Secondary | ICD-10-CM

## 2016-04-25 HISTORY — PX: WOUND DEBRIDEMENT: SHX247

## 2016-04-25 HISTORY — PX: AMPUTATION: SHX166

## 2016-04-25 HISTORY — PX: APPLICATION OF WOUND VAC: SHX5189

## 2016-04-25 LAB — GLUCOSE, CAPILLARY
GLUCOSE-CAPILLARY: 167 mg/dL — AB (ref 65–99)
GLUCOSE-CAPILLARY: 116 mg/dL — AB (ref 65–99)
GLUCOSE-CAPILLARY: 160 mg/dL — AB (ref 65–99)
GLUCOSE-CAPILLARY: 184 mg/dL — AB (ref 65–99)
Glucose-Capillary: 203 mg/dL — ABNORMAL HIGH (ref 65–99)

## 2016-04-25 LAB — POCT I-STAT 4, (NA,K, GLUC, HGB,HCT)
GLUCOSE: 166 mg/dL — AB (ref 65–99)
HCT: 26 % — ABNORMAL LOW (ref 39.0–52.0)
Hemoglobin: 8.8 g/dL — ABNORMAL LOW (ref 13.0–17.0)
POTASSIUM: 4.1 mmol/L (ref 3.5–5.1)
SODIUM: 139 mmol/L (ref 135–145)

## 2016-04-25 LAB — CBC
HCT: 26.3 % — ABNORMAL LOW (ref 40.0–52.0)
HEMOGLOBIN: 8.1 g/dL — AB (ref 13.0–18.0)
MCH: 27 pg (ref 26.0–34.0)
MCHC: 30.6 g/dL — AB (ref 32.0–36.0)
MCV: 88.1 fL (ref 80.0–100.0)
PLATELETS: 308 10*3/uL (ref 150–440)
RBC: 2.99 MIL/uL — ABNORMAL LOW (ref 4.40–5.90)
RDW: 20.9 % — ABNORMAL HIGH (ref 11.5–14.5)
WBC: 9.8 10*3/uL (ref 3.8–10.6)

## 2016-04-25 LAB — CREATININE, SERUM
CREATININE: 5.9 mg/dL — AB (ref 0.61–1.24)
GFR calc Af Amer: 11 mL/min — ABNORMAL LOW (ref >60)
GFR calc non Af Amer: 10 mL/min — ABNORMAL LOW (ref >60)

## 2016-04-25 SURGERY — DEBRIDEMENT, WOUND
Anesthesia: General | Laterality: Right

## 2016-04-25 MED ORDER — CEFAZOLIN SODIUM-DEXTROSE 2-4 GM/100ML-% IV SOLN
INTRAVENOUS | Status: AC
  Administered 2016-04-25: 2 g via INTRAVENOUS
  Filled 2016-04-25: qty 100

## 2016-04-25 MED ORDER — DEXAMETHASONE SODIUM PHOSPHATE 10 MG/ML IJ SOLN
INTRAMUSCULAR | Status: DC | PRN
  Administered 2016-04-25: 5 mg via INTRAVENOUS

## 2016-04-25 MED ORDER — VANCOMYCIN HCL IN DEXTROSE 1-5 GM/200ML-% IV SOLN
1000.0000 mg | INTRAVENOUS | Status: DC
  Administered 2016-04-26: 1000 mg via INTRAVENOUS
  Filled 2016-04-25 (×2): qty 200

## 2016-04-25 MED ORDER — FENTANYL CITRATE (PF) 100 MCG/2ML IJ SOLN: 25.0000 ug | INTRAMUSCULAR | Status: DC | PRN

## 2016-04-25 MED ORDER — ACETAMINOPHEN 650 MG RE SUPP
650.0000 mg | Freq: Four times a day (QID) | RECTAL | Status: DC | PRN
  Filled 2016-04-25: qty 1

## 2016-04-25 MED ORDER — HEPARIN SODIUM (PORCINE) 5000 UNIT/ML IJ SOLN
5000.0000 [IU] | Freq: Three times a day (TID) | INTRAMUSCULAR | Status: DC
  Administered 2016-04-25 – 2016-04-28 (×7): 5000 [IU] via SUBCUTANEOUS
  Filled 2016-04-25 (×7): qty 1

## 2016-04-25 MED ORDER — MIDAZOLAM HCL 2 MG/2ML IJ SOLN
INTRAMUSCULAR | Status: DC | PRN
Start: 2016-04-25 — End: 2016-04-25
  Administered 2016-04-25: 1 mg via INTRAVENOUS

## 2016-04-25 MED ORDER — HYDROCODONE-ACETAMINOPHEN 5-325 MG PO TABS: 1.0000 | ORAL_TABLET | Freq: Four times a day (QID) | ORAL | Status: DC | PRN

## 2016-04-25 MED ORDER — PHENYLEPHRINE HCL 10 MG/ML IJ SOLN
INTRAMUSCULAR | Status: DC | PRN
  Administered 2016-04-25 (×2): 200 ug via INTRAVENOUS
  Administered 2016-04-25: 100 ug via INTRAVENOUS
  Administered 2016-04-25: 200 ug via INTRAVENOUS

## 2016-04-25 MED ORDER — ONDANSETRON HCL 4 MG/2ML IJ SOLN: 4.0000 mg | Freq: Once | INTRAMUSCULAR | Status: DC | PRN

## 2016-04-25 MED ORDER — ONDANSETRON HCL 4 MG/2ML IJ SOLN
4.0000 mg | Freq: Four times a day (QID) | INTRAMUSCULAR | Status: DC | PRN
  Administered 2016-04-29: 4 mg via INTRAVENOUS
  Filled 2016-04-25: qty 2

## 2016-04-25 MED ORDER — SEVELAMER CARBONATE 800 MG PO TABS
800.0000 mg | ORAL_TABLET | Freq: Three times a day (TID) | ORAL | Status: DC
  Administered 2016-04-25 – 2016-04-26 (×4): 800 mg via ORAL
  Filled 2016-04-25 (×5): qty 1

## 2016-04-25 MED ORDER — ONDANSETRON HCL 4 MG PO TABS: 4.0000 mg | ORAL_TABLET | Freq: Four times a day (QID) | ORAL | Status: DC | PRN

## 2016-04-25 MED ORDER — VANCOMYCIN HCL 10 G IV SOLR
2000.0000 mg | Freq: Once | INTRAVENOUS | Status: AC
  Administered 2016-04-25: 2000 mg via INTRAVENOUS
  Filled 2016-04-25: qty 2000

## 2016-04-25 MED ORDER — MAGNESIUM HYDROXIDE 400 MG/5ML PO SUSP: 30.0000 mL | Freq: Every day | ORAL | Status: DC | PRN

## 2016-04-25 MED ORDER — NEOMYCIN-POLYMYXIN B GU 40-200000 IR SOLN
Status: AC
  Filled 2016-04-25: qty 2

## 2016-04-25 MED ORDER — FLEET ENEMA 7-19 GM/118ML RE ENEM: 1.0000 | ENEMA | Freq: Once | RECTAL | Status: DC | PRN

## 2016-04-25 MED ORDER — CEFAZOLIN SODIUM-DEXTROSE 2-4 GM/100ML-% IV SOLN
2.0000 g | INTRAVENOUS | Status: AC
  Administered 2016-04-25: 2 g via INTRAVENOUS

## 2016-04-25 MED ORDER — ASPIRIN EC 81 MG PO TBEC
81.0000 mg | DELAYED_RELEASE_TABLET | Freq: Every day | ORAL | Status: DC
  Administered 2016-04-26 – 2016-05-02 (×3): 81 mg via ORAL
  Filled 2016-04-25 (×3): qty 1

## 2016-04-25 MED ORDER — CINACALCET HCL 30 MG PO TABS
60.0000 mg | ORAL_TABLET | Freq: Every day | ORAL | Status: DC
  Administered 2016-04-26: 60 mg via ORAL
  Filled 2016-04-25: qty 2

## 2016-04-25 MED ORDER — MIDODRINE HCL 5 MG PO TABS
10.0000 mg | ORAL_TABLET | Freq: Three times a day (TID) | ORAL | Status: DC
  Administered 2016-04-26 – 2016-05-02 (×8): 10 mg via ORAL
  Filled 2016-04-25 (×10): qty 2

## 2016-04-25 MED ORDER — FENTANYL CITRATE (PF) 100 MCG/2ML IJ SOLN
INTRAMUSCULAR | Status: DC | PRN
  Administered 2016-04-25: 50 ug via INTRAVENOUS

## 2016-04-25 MED ORDER — PROPOFOL 10 MG/ML IV BOLUS
INTRAVENOUS | Status: DC | PRN
  Administered 2016-04-25: 140 mg via INTRAVENOUS
  Administered 2016-04-25: 20 mg via INTRAVENOUS

## 2016-04-25 MED ORDER — LIDOCAINE HCL (CARDIAC) 20 MG/ML IV SOLN
INTRAVENOUS | Status: DC | PRN
  Administered 2016-04-25: 100 mg via INTRAVENOUS

## 2016-04-25 MED ORDER — SORBITOL 70 % SOLN
30.0000 mL | Freq: Every day | Status: DC | PRN
  Filled 2016-04-25: qty 30

## 2016-04-25 MED ORDER — MORPHINE SULFATE (PF) 2 MG/ML IV SOLN: 2.0000 mg | INTRAVENOUS | Status: DC | PRN

## 2016-04-25 MED ORDER — INSULIN ASPART 100 UNIT/ML ~~LOC~~ SOLN
0.0000 [IU] | Freq: Three times a day (TID) | SUBCUTANEOUS | Status: DC
  Administered 2016-04-25: 3 [IU] via SUBCUTANEOUS
  Filled 2016-04-25: qty 3

## 2016-04-25 MED ORDER — FENTANYL CITRATE (PF) 100 MCG/2ML IJ SOLN
25.0000 ug | INTRAMUSCULAR | Status: DC | PRN
Start: 2016-04-25 — End: 2016-04-25

## 2016-04-25 MED ORDER — SODIUM CHLORIDE 0.9 % IV SOLN
INTRAVENOUS | Status: DC
  Administered 2016-04-25: 12:00:00 via INTRAVENOUS

## 2016-04-25 MED ORDER — FAMOTIDINE 20 MG PO TABS: 20.0000 mg | ORAL_TABLET | Freq: Once | ORAL | Status: DC

## 2016-04-25 MED ORDER — GLYCOPYRROLATE 0.2 MG/ML IJ SOLN
INTRAMUSCULAR | Status: DC | PRN
  Administered 2016-04-25: 0.2 mg via INTRAVENOUS

## 2016-04-25 MED ORDER — ACETAMINOPHEN 325 MG PO TABS: 650.0000 mg | ORAL_TABLET | Freq: Four times a day (QID) | ORAL | Status: DC | PRN

## 2016-04-25 MED ORDER — INSULIN ASPART 100 UNIT/ML ~~LOC~~ SOLN: 0.0000 [IU] | Freq: Every day | SUBCUTANEOUS | Status: DC

## 2016-04-25 MED ORDER — DOCUSATE SODIUM 100 MG PO CAPS
100.0000 mg | ORAL_CAPSULE | Freq: Two times a day (BID) | ORAL | Status: DC
  Administered 2016-04-25: 100 mg via ORAL
  Filled 2016-04-25: qty 1

## 2016-04-25 MED ORDER — INSULIN ASPART 100 UNIT/ML ~~LOC~~ SOLN
0.0000 [IU] | Freq: Three times a day (TID) | SUBCUTANEOUS | Status: DC
  Administered 2016-04-26 (×3): 1 [IU] via SUBCUTANEOUS
  Administered 2016-04-28: 2 [IU] via SUBCUTANEOUS
  Administered 2016-04-28 – 2016-04-30 (×2): 1 [IU] via SUBCUTANEOUS
  Administered 2016-04-30: 2 [IU] via SUBCUTANEOUS
  Administered 2016-05-02 (×3): 1 [IU] via SUBCUTANEOUS
  Filled 2016-04-25: qty 2
  Filled 2016-04-25 (×6): qty 1
  Filled 2016-04-25: qty 2
  Filled 2016-04-25: qty 1

## 2016-04-25 MED ORDER — ONDANSETRON HCL 4 MG/2ML IJ SOLN
INTRAMUSCULAR | Status: DC | PRN
  Administered 2016-04-25: 4 mg via INTRAVENOUS

## 2016-04-25 SURGICAL SUPPLY — 35 items
AVANCE NEGATIVE PRESSURE WOUND THERAPY ×5 IMPLANT
BANDAGE ELASTIC 4 LF NS (GAUZE/BANDAGES/DRESSINGS) ×3 IMPLANT
BNDG COHESIVE 6X5 TAN STRL LF (GAUZE/BANDAGES/DRESSINGS) ×3 IMPLANT
BNDG GAUZE 4.5X4.1 6PLY STRL (MISCELLANEOUS) ×3 IMPLANT
BRUSH SCRUB 4% CHG (MISCELLANEOUS) ×3 IMPLANT
CANISTER SUCT 1200ML W/VALVE (MISCELLANEOUS) ×3 IMPLANT
CHLORAPREP W/TINT 26ML (MISCELLANEOUS) ×3 IMPLANT
DRSG EMULSION OIL 3X3 NADH (GAUZE/BANDAGES/DRESSINGS) IMPLANT
DRSG VAC ATS MED SENSATRAC (GAUZE/BANDAGES/DRESSINGS) IMPLANT
ELECT CAUTERY BLADE TIP 2.5 (TIP) ×3
ELECT REM PT RETURN 9FT ADLT (ELECTROSURGICAL) ×3
ELECTRODE CAUTERY BLDE TIP 2.5 (TIP) ×1 IMPLANT
ELECTRODE REM PT RTRN 9FT ADLT (ELECTROSURGICAL) ×1 IMPLANT
GAUZE SPONGE 4X4 12PLY STRL (GAUZE/BANDAGES/DRESSINGS) ×3 IMPLANT
GAUZE STRETCH 2X75IN STRL (MISCELLANEOUS) IMPLANT
GLOVE SURG SYN 8.0 (GLOVE) ×3 IMPLANT
GOWN STRL REUS W/ TWL LRG LVL3 (GOWN DISPOSABLE) ×1 IMPLANT
GOWN STRL REUS W/ TWL XL LVL3 (GOWN DISPOSABLE) ×1 IMPLANT
GOWN STRL REUS W/TWL LRG LVL3 (GOWN DISPOSABLE) ×2
GOWN STRL REUS W/TWL XL LVL3 (GOWN DISPOSABLE) ×2
HANDLE YANKAUER SUCT BULB TIP (MISCELLANEOUS) ×3 IMPLANT
KIT RM TURNOVER STRD PROC AR (KITS) ×3 IMPLANT
LABEL OR SOLS (LABEL) ×3 IMPLANT
NS IRRIG 500ML POUR BTL (IV SOLUTION) ×3 IMPLANT
PACK EXTREMITY ARMC (MISCELLANEOUS) ×3 IMPLANT
PAD PREP 24X41 OB/GYN DISP (PERSONAL CARE ITEMS) ×3 IMPLANT
SOL PREP PVP 2OZ (MISCELLANEOUS) ×3
SOLUTION PREP PVP 2OZ (MISCELLANEOUS) ×1 IMPLANT
STOCKINETTE IMPERV 14X48 (MISCELLANEOUS) ×3 IMPLANT
STOCKINETTE STRL 6IN 960660 (GAUZE/BANDAGES/DRESSINGS) ×3 IMPLANT
SUT ETHILON 3-0 FS-10 30 BLK (SUTURE) ×3
SUT VIC AB 3-0 SH 27 (SUTURE) ×2
SUT VIC AB 3-0 SH 27X BRD (SUTURE) ×1 IMPLANT
SUTURE EHLN 3-0 FS-10 30 BLK (SUTURE) ×1 IMPLANT
WND VAC CANISTER 500ML (MISCELLANEOUS) ×3 IMPLANT

## 2016-04-25 NOTE — Consult Note (Addendum)
[facility] NAME: [name]    MR#:  [id number]  DATE OF BIRTH:  19-Nov-1960  DATE OF ADMISSION:  04/25/2016  PRIMARY CARE PHYSICIAN: [facility]  REQUESTING/REFERRING PHYSICIAN: Dr. Hortencia Pilar  CHIEF COMPLAINT:  Consult for medical management  HISTORY OF PRESENT ILLNESS:  [name]  is a 55 y.o. male with a known history of End-stage renal disease, hypotension, diabetes, peripheral vascular disease. He was taken to the operating room by vascular surgery today and had amputation of second and third fourth toes. He had excision and debridement of necrotic tissue including skin and subcutaneous tissues and tendons and bone first and fifth rays. Patient states he feels well. Only 2 out of 10 pain in his right foot.  PAST MEDICAL HISTORY:   Past Medical History  Diagnosis Date  . Chronic kidney disease   . Hypertension   . Diabetes mellitus without complication ([place])   . Hypercholesteremia   . Atherosclerosis   . Stroke ([place])   . Anemia     PAST SURGICAL HISTOIRY:   Past Surgical History  Procedure Laterality Date  . Av fistula placement Left 2015  . Peripheral vascular catheterization N/A 10/03/2015    Procedure: A/V Shuntogram/Fistulagram;  Surgeon: [name], MD;  Location: [facility];  Service: Cardiovascular;  Laterality: N/A;  . Peripheral vascular catheterization N/A 01/16/2016    Procedure: Abdominal Aortogram w/Lower Extremity;  Surgeon: [name], MD;  Location: [facility];  Service: Cardiovascular;  Laterality: N/A;  . Peripheral vascular catheterization  01/16/2016    Procedure: Lower Extremity Intervention;  Surgeon: [name], MD;  Location: [facility];  Service: Cardiovascular;;  . Leg amputation below knee Left   . Wound debridement Right 04/25/2016    Procedure: DEBRIDEMENT WOUND;  Surgeon: Katha Cabal, MD;  Location: [facility];  Service: Vascular;   Laterality: Right;  . Application of wound vac Right 04/25/2016    Procedure: APPLICATION OF WOUND VAC;  Surgeon: Katha Cabal, MD;  Location: [facility];  Service: Vascular;  Laterality: Right;  . Amputation Right 04/25/2016    Procedure: TOE AMPUTATION;  Surgeon: Katha Cabal, MD;  Location: [facility];  Service: Vascular;  Laterality: Right;    SOCIAL HISTORY:   Social History  Substance Use Topics  . Smoking status: Never Smoker   . Smokeless tobacco: Not on file  . Alcohol Use: No    FAMILY HISTORY:   Family History  Problem Relation Age of Onset  . Prostate cancer Mother   . Diabetes Father     DRUG ALLERGIES:  No Known Allergies  REVIEW OF SYSTEMS:  CONSTITUTIONAL: No fever, fatigue or weakness. Positive for weight loss EYES: No blurred or double vision. Wears reading glasses EARS, NOSE, AND THROAT: No tinnitus or ear pain.  RESPIRATORY: Dry cough cough. No shortness of breath, wheezing or hemoptysis.  CARDIOVASCULAR: No chest pain, orthopnea, edema.  GASTROINTESTINAL: No nausea, vomiting, diarrhea or abdominal pain. Some constipation. GENITOURINARY: Patient does not make urine ENDOCRINE: No polyuria, nocturia,  HEMATOLOGY: History of anemia SKIN: No rash or lesion. Has itching when his phosphorus is high. MUSCULOSKELETAL: Positive for pain NEUROLOGIC: No tingling, numbness, weakness.  PSYCHIATRY: No anxiety or depression.   MEDICATIONS AT HOME:   Prior to Admission medications   Medication Sig Start Date End Date Taking? Authorizing Provider  sevelamer carbonate (RENVELA) 800 MG tablet Take 800 mg by mouth  3 (three) times daily with meals.   Yes Historical Provider, MD  aspirin 325 MG tablet Take 325 mg by mouth daily.    Historical Provider, MD  cinacalcet (SENSIPAR) 30 MG tablet Take 60 mg by mouth daily.     Historical Provider, MD  glipiZIDE (GLUCOTROL) 10 MG tablet Take 10 mg by mouth daily before breakfast.    Historical Provider, MD  midodrine  (PROAMATINE) 10 MG tablet Take 10 mg by mouth 3 (three) times daily.    Historical Provider, MD      VITAL SIGNS:  Blood pressure 147/86, pulse 86, temperature 98.3 F (36.8 C), temperature source Oral, resp. rate 18, height 5\' 7"  (1.702 m), weight 112.946 kg (249 lb), SpO2 94 %.  PHYSICAL EXAMINATION:  GENERAL:  55 y.o.-year-old patient lying in the bed with no acute distress.  EYES: Pupils equal, round, reactive to light and accommodation. No scleral icterus. Extraocular muscles intact.  HEENT: Head atraumatic, normocephalic. Oropharynx and nasopharynx clear.  NECK:  Supple, no jugular venous distention. No thyroid enlargement, no tenderness.  LUNGS: Normal breath sounds bilaterally, no wheezing, rales,rhonchi or crepitation. No use of accessory muscles of respiration.  CARDIOVASCULAR: S1, S2 normal. No murmurs, rubs, or gallops.  ABDOMEN: Soft, nontender, nondistended. Bowel sounds present. No organomegaly or mass.  EXTREMITIES: Trace edema. No cyanosis, or clubbing.  NEUROLOGIC: Cranial nerves II through XII are intact. Muscle strength 5/5 in all extremities. Sensation intact. Gait not checked.  PSYCHIATRIC: The patient is alert and oriented x 3.  SKIN: Right foot covered with wound VAC  LABORATORY PANEL:   CBC  Recent Labs Lab 04/25/16 1513  WBC 9.8  HGB 8.1*  HCT 26.3*  PLT 308   ------------------------------------------------------------------------------------------------------------------  Chemistries   Recent Labs Lab 04/23/16 1503 04/25/16 0945 04/25/16 1513  NA 144 139  --   K 4.2 4.1  --   CL 99*  --   --   CO2 33*  --   --   GLUCOSE 180* 166*  --   BUN 45*  --   --   CREATININE 6.87*  --  5.90*  CALCIUM 7.8*  --   --    ------------------------------------------------------------------------------------------------------------------    IMPRESSION AND PLAN:   1. Type 2 diabetes mellitus. Patient takes Glucotrol at home. This is on hold. Put on  sliding scale for now and check a hemoglobin A1c. 2. End-stage renal disease on hemodialysis Tuesday Thursday and Saturday. Nephrology consultation. 3. Necrotic wound debridement right foot. We'll put on vancomycin pharmacy to consult. Wound VAC in place. Podiatry to do a transmetatarsal amputation and wound closure on Saturday. Angiogram on Friday afternoon. 4. Peripheral last disease angiogram on Friday afternoon. Patient on aspirin. 5. History of stroke on aspirin 6. Anemia of chronic disease. Procrit with dialysis  All the records are reviewed and case discussed with Consulting provider. Management plans discussed with the patient, family and they are in agreement.  CODE STATUS: Full code  TOTAL TIME TAKING CARE OF THIS PATIENT: 50 minutes. Spanish interpreter provided by the hospital was needed for history and physical.   Milas Hock.D on 04/25/2016 at 6:04 PM  Between 7am to 6pm - Pager - [phone]  After 6pm go to [email or web address] - Proofreader  [employer]  Office  [phone]  CC: Primary care Physician: [facility]

## 2016-04-25 NOTE — Consult Note (Signed)
Patient Demographics  [name], is a 55 y.o. male   MRN: [id number]   DOB - July 20, 1961  Admit Date - 04/25/2016    Outpatient Primary [name] for the patient is [name] requested in the Hospital by [name], On 04/25/2016    Reason for consult gangrene to the toes of the right foot   With History of -  Past Medical History  Diagnosis Date  . Chronic kidney disease   . Hypertension   . Diabetes mellitus without complication ([place])   . Hypercholesteremia   . Atherosclerosis   . Stroke ([place])   . Anemia       Past Surgical History  Procedure Laterality Date  . Av fistula placement Left 2015  . Peripheral vascular catheterization N/A 10/03/2015    Procedure: A/V Shuntogram/Fistulagram;  Surgeon: Kristopher Huxley, [name];  Location: [facility];  Service: Cardiovascular;  Laterality: N/A;  . Peripheral vascular catheterization N/A 01/16/2016    Procedure: Abdominal Aortogram w/Lower Extremity;  Surgeon: Kristopher Huxley, [name];  Location: [facility];  Service: Cardiovascular;  Laterality: N/A;  . Peripheral vascular catheterization  01/16/2016    Procedure: Lower Extremity Intervention;  Surgeon: Kristopher Huxley, [name];  Location: [facility];  Service: Cardiovascular;;  . Leg amputation below knee Left   . Wound debridement Right 04/25/2016    Procedure: DEBRIDEMENT WOUND;  Surgeon: [name];  Location: [facility];  Service: Vascular;  Laterality: Right;  . Application of wound vac Right 04/25/2016    Procedure: APPLICATION OF WOUND VAC;  Surgeon: [name];  Location: [facility];  Service: Vascular;  Laterality: Right;  . Amputation Right 04/25/2016    Procedure: TOE AMPUTATION;  Surgeon: [name];  Location: [facility];  Service: Vascular;   Laterality: Right;    in for   No chief complaint on file.    HPI  [name]  is a 55 y.o. male, Admitted today by Dr. Ronalee [name] following removal of toes due to gangrenous changes. Wound VAC was applied. He apparently did not follow through with treatment back in May and progressed with worsening of the gangrene to his digits. All the digits were involved apparently. He currently has a wound VAC on the foot following his debridement.    Social History Social History  Substance Use Topics  . Smoking status: Never Smoker   . Smokeless tobacco: Not on file  . Alcohol Use: No       Prior to Admission medications   Medication Sig Start Date End Date Taking? Authorizing Kristopher  sevelamer carbonate (RENVELA) 800 MG tablet Take 800 mg by mouth 3 (three) times daily with meals.   Yes Historical Provider, [name]  aspirin 325 MG tablet Take 325 mg by mouth daily.    Historical Provider, [name]  cinacalcet (SENSIPAR) 30 MG tablet Take 60 mg by mouth daily.     Historical Provider, [name]  glipiZIDE (GLUCOTROL) 10 MG tablet Take 10 mg by mouth daily before breakfast.    Historical Provider, [name]  midodrine (PROAMATINE) 10 MG tablet Take 10 mg by mouth 3 (three)  times daily.    Historical Provider, [name]    Anti-infectives    Start     Dose/Rate Route Frequency Ordered Stop   04/25/16 1152  ceFAZolin (ANCEF) IVPB 2g/100 mL premix     2 g 200 mL/hr over 30 Minutes Intravenous On call to O.R. 04/25/16 1152 04/25/16 1223      Scheduled Meds: . aspirin EC  81 mg Oral Daily  . [START ON 04/26/2016] cinacalcet  60 mg Oral Q breakfast  . docusate sodium  100 mg Oral BID  . heparin  5,000 Units Subcutaneous Q8H  . insulin aspart  0-15 Units Subcutaneous TID WC  . midodrine  10 mg Oral TID PC  . sevelamer carbonate  800 mg Oral TID WC  No Known Allergies  Physical Exam  Vitals  Blood pressure 147/86, pulse 86, temperature 98.3 F (36.8 C), temperature source Oral, resp. rate 18, height 5\' 7"   (1.702 m), weight 112.946 kg (249 lb), SpO2 94 %.  Lower Extremity exam:  Vascular:Nonpalpable to the right foot. The left foot and leg has been amputated to a below-knee level.  Dermatological: Surgical wound distal right foot secondary to gangrenous changes for by Dr. Ronalee [name] earlier today. Wound VAC is in place and functional  Neurological: Likely severe diabetic neuropathy    Data Review  CBC  Recent Labs Lab 04/23/16 1503 04/25/16 0945 04/25/16 1513  WBC 10.9*  --  9.8  HGB 8.0* 8.8* 8.1*  HCT 25.3* 26.0* 26.3*  PLT 308  --  308  MCV 87.4  --  88.1  MCH 27.6  --  27.0  MCHC 31.6*  --  30.6*  RDW 20.8*  --  20.9*  LYMPHSABS 1.0  --   --   MONOABS 0.9  --   --   EOSABS 0.9*  --   --   BASOSABS 0.1  --   --    ------------------------------------------------------------------------------------------------------------------  Chemistries   Recent Labs Lab 04/23/16 1503 04/25/16 0945 04/25/16 1513  NA 144 139  --   K 4.2 4.1  --   CL 99*  --   --   CO2 33*  --   --   GLUCOSE 180* 166*  --   BUN 45*  --   --   CREATININE 6.87*  --  5.90*  CALCIUM 7.8*  --   --    ------------------------------------------------------------------------------------------------------------------- Imaging results:   No results found.  Assessment & Plan: Severe peripheral vascular disease with gangrenous changes to the toes which have been surgically debrided but patient needs transmetatarsal amputation. Plan: Patient is scheduled to have an angioplasty on Friday with Dr. Ronalee [name] At that point hopefully with restored circulation can proceed with the transmetatarsal amputation on Saturday and do a primary closure of this area. I discussed this issue with he and his sister in the presence of an interpreter.  Active Problems:   Atherosclerosis of extremity with gangrene [facility])   [name] M.D on 04/25/2016 at 5:46 PM  Thank you for the consult, we will follow the patient  with you in the Hospital.

## 2016-04-25 NOTE — Progress Notes (Signed)
Pt came to floor at 1500. VSS. Pt was oriented to room and safety plan.

## 2016-04-25 NOTE — Anesthesia Procedure Notes (Addendum)
Procedure Name: LMA Insertion Date/Time: 04/25/2016 12:05 PM Performed by: Doreen Salvage Pre-anesthesia Checklist: Patient identified, Patient being monitored, Timeout performed, Emergency Drugs available and Suction available Patient Re-evaluated:Patient Re-evaluated prior to inductionOxygen Delivery Method: Circle system utilized Preoxygenation: Pre-oxygenation with 100% oxygen Intubation Type: IV induction Ventilation: Mask ventilation without difficulty LMA: LMA inserted LMA Size: 4.5 Tube type: Oral Number of attempts: 1 Placement Confirmation: positive ETCO2 and breath sounds checked- equal and bilateral Tube secured with: Tape Dental Injury: Teeth and Oropharynx as per pre-operative assessment    Procedure Name: LMA Insertion Date/Time: 04/25/2016 12:25 PM Performed by: Doreen Salvage Pre-anesthesia Checklist: Patient identified, Patient being monitored, Timeout performed, Emergency Drugs available and Suction available Patient Re-evaluated:Patient Re-evaluated prior to inductionOxygen Delivery Method: Circle system utilized Preoxygenation: Pre-oxygenation with 100% oxygen Intubation Type: IV induction Ventilation: Mask ventilation without difficulty LMA: LMA inserted LMA Size: 5.0 Tube type: Oral Number of attempts: 1 Placement Confirmation: positive ETCO2 and breath sounds checked- equal and bilateral Tube secured with: Tape Dental Injury: Teeth and Oropharynx as per pre-operative assessment

## 2016-04-25 NOTE — Anesthesia Postprocedure Evaluation (Signed)
Anesthesia Post Note  Patient: [name]  Procedure(s) Performed: Procedure(s) (LRB): DEBRIDEMENT WOUND (Right) APPLICATION OF WOUND VAC (Right) TOE AMPUTATION (Right)  Patient location during evaluation: PACU Anesthesia Type: General Level of consciousness: awake Pain management: satisfactory to patient Vital Signs Assessment: post-procedure vital signs reviewed and stable Respiratory status: spontaneous breathing Cardiovascular status: stable Anesthetic complications: no    Last Vitals:  Filed Vitals:   04/25/16 0934 04/25/16 1308  BP: 165/96 116/57  Pulse: 93 87  Temp: 37.1 C 37.5 C  Resp: 16 20    Last Pain:  Filed Vitals:   04/25/16 1314  PainSc: Asleep                 VAN STAVEREN,Makhiya Coburn

## 2016-04-25 NOTE — Progress Notes (Signed)
Avance  Wound vac was changed to a hospital wound vac. Avance machine was placed in closet with a chart label placed on it.

## 2016-04-25 NOTE — Op Note (Addendum)
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   Amputation of the second third and fourth toes through the MP joint 2.   Excisional debridement of necrotic tissue including skin and subcutaneous tissues and tendon and bone first and fifth rays  SURGEON: Hlee Fringer, Dolores Lory  ASSISTANT(S): None  ANESTHESIA: Gen. by LMA  ESTIMATED BLOOD LOSS: Less than 20 cc  FINDING(S): 1.  Gangrene of the toes which are mummified necrotic tissue across the forefoot  SPECIMEN(S):  Debridement tissue with the toes en bloc  INDICATIONS:   [name] is a 55 y.o. male who presents with multiple medical problems including atherosclerotic occlusive disease end-stage renal disease diabetes. He is status post left below-knee amputation. He presented to the office on Monday with gangrenous changes of the right foot arrangements have now been made to perform debridement place a VAC subsequently he will undergo angiography and then podiatry will be asked to evaluate him for formal TMA.  DESCRIPTION: After obtaining full informed written consent, the patient was brought back to the operating room and placed supine upon the operating table.  The patient received IV antibiotics prior to induction.  After obtaining adequate anesthesia, the patient was prepped and draped in the standard fashion for: Toe amputation with debridement of necrotic tissue.  Incision was made with a 15 blade scalpel along the base of the gangrenous toes and the MP joints identified. Using the 15 blade scalpel the joints were separated the necrotic and gangrenous tissue on the plantar surface was then excised and the specimen passed off the table en bloc consisting of all 3 toes. The great toe and the fifth toe had "fallen off" per the patient and  the exposed metatarsal heads were fragmented and clinically infected this was debris noted as well and included tendons and necrotic subcutaneous tissue and fat pad as well as some skin from the first and fifth rays. The wound was then irrigated and hemostasis obtained with Bovie cautery.  A VAC dressing was then applied excellent seal was noted with continuous suction at 125 mmHg.  After debridement the wound measures 12 cm x 5 cm x 2 cm  Patient was taken to the recovery room in stable condition  COMPLICATIONS: None  CONDITION: Stable  [name]  04/25/2016, 3:40 PM

## 2016-04-25 NOTE — Progress Notes (Signed)
Pharmacy Antibiotic Note  [name] is a 55 y.o. male admitted on 04/25/2016 with wound infection.  Pt is on HD every T-Th-Sat. Pharmacy has been consulted for Vancomycin dosing.  Plan: Will order Vancomycin 2 gm IV X 1 to be given on 7/12. Will order Vancomycin 1 gm IV to be given T-Th-Sat  1 hr before end of each HD session.  Will draw 1st trough 30 minutes before 3rd HD session on 7/18.   Height: 5\' 7"  (170.2 cm) Weight: 249 lb (112.946 kg) IBW/kg (Calculated) : 66.1  Temp (24hrs), Avg:98.7 F (37.1 C), Min:98.3 F (36.8 C), Max:99.5 F (37.5 C)   Recent Labs Lab 04/23/16 1503 04/25/16 1513  WBC 10.9* 9.8  CREATININE 6.87* 5.90*    Estimated Creatinine Clearance: 16.8 mL/min (by C-G formula based on Cr of 5.9).    No Known Allergies  Antimicrobials this admission:   >>    >>   Dose adjustments this admission:   Microbiology results:  BCx:   UCx:    Sputum:    MRSA PCR:   Thank you for allowing pharmacy to be a part of this patient's care.  [name] D 04/25/2016 6:54 PM

## 2016-04-25 NOTE — Transfer of Care (Signed)
Immediate Anesthesia Transfer of Care Note  Patient: [name]  Procedure(s) Performed: Procedure(s): DEBRIDEMENT WOUND (Right) APPLICATION OF WOUND VAC (Right) TOE AMPUTATION (Right)  Patient Location: PACU  Anesthesia Type:General  Level of Consciousness: sedated  Airway & Oxygen Therapy: Patient Spontanous Breathing and Patient connected to face mask oxygen  Post-op Assessment: Report given to RN and Post -op Vital signs reviewed and stable  Post vital signs: Reviewed and stable  Last Vitals:  Filed Vitals:   04/25/16 0934 04/25/16 1308  BP: 165/96 116/57  Pulse: 93 87  Temp: 37.1 C 37.5 C  Resp: 16 20    Complications: No apparent anesthesia complications

## 2016-04-25 NOTE — Anesthesia Preprocedure Evaluation (Addendum)
Anesthesia Evaluation  Patient identified by MRN, date of birth, ID band Patient awake    Reviewed: Allergy & Precautions, NPO status   Airway Mallampati: III       Dental  (+) Missing   Pulmonary neg pulmonary ROS,    breath sounds clear to auscultation + decreased breath sounds      Cardiovascular Exercise Tolerance: Poor hypertension, Pt. on medications  Rhythm:Regular     Neuro/Psych CVA    GI/Hepatic negative GI ROS, Neg liver ROS,   Endo/Other  diabetes, Type 2, Oral Hypoglycemic AgentsMorbid obesity  Renal/GU DialysisRenal disease     Musculoskeletal   Abdominal (+) + obese,   Peds  Hematology  (+) anemia ,   Anesthesia Other Findings   Reproductive/Obstetrics                            Anesthesia Physical Anesthesia Plan  ASA: III  Anesthesia Plan: General   Post-op Pain Management:    Induction: Intravenous  Airway Management Planned: LMA  Additional Equipment:   Intra-op Plan:   Post-operative Plan: Extubation in OR  Informed Consent: I have reviewed the patients History and Physical, chart, labs and discussed the procedure including the risks, benefits and alternatives for the proposed anesthesia with the patient or authorized representative who has indicated his/her understanding and acceptance.     Plan Discussed with: CRNA  Anesthesia Plan Comments:         Anesthesia Quick Evaluation

## 2016-04-25 NOTE — H&P (Signed)
 [facility] History & Physical Update  The patient was interviewed and re-examined.  The patient's previous History and Physical has been reviewed and is unchanged.  There is no change in the plan of care. We plan to proceed with the scheduled procedure.  Schnier, Dolores Lory, MD  04/25/2016, 11:18 AM

## 2016-04-26 LAB — GLUCOSE, CAPILLARY
GLUCOSE-CAPILLARY: 126 mg/dL — AB (ref 65–99)
GLUCOSE-CAPILLARY: 157 mg/dL — AB (ref 65–99)
Glucose-Capillary: 150 mg/dL — ABNORMAL HIGH (ref 65–99)
Glucose-Capillary: 144 mg/dL — ABNORMAL HIGH (ref 65–99)

## 2016-04-26 LAB — BASIC METABOLIC PANEL
Anion gap: 10 (ref 5–15)
BUN: 49 mg/dL — ABNORMAL HIGH (ref 6–20)
CALCIUM: 8.3 mg/dL — AB (ref 8.9–10.3)
CO2: 32 mmol/L (ref 22–32)
CREATININE: 7.05 mg/dL — AB (ref 0.61–1.24)
Chloride: 97 mmol/L — ABNORMAL LOW (ref 101–111)
GFR, EST AFRICAN AMERICAN: 9 mL/min — AB (ref >60)
GFR, EST NON AFRICAN AMERICAN: 8 mL/min — AB (ref >60)
Glucose, Bld: 178 mg/dL — ABNORMAL HIGH (ref 65–99)
Potassium: 4.9 mmol/L (ref 3.5–5.1)
SODIUM: 139 mmol/L (ref 135–145)

## 2016-04-26 LAB — CBC
HCT: 24.3 % — ABNORMAL LOW (ref 40.0–52.0)
Hemoglobin: 7.5 g/dL — ABNORMAL LOW (ref 13.0–18.0)
MCH: 27.6 pg (ref 26.0–34.0)
MCHC: 31 g/dL — ABNORMAL LOW (ref 32.0–36.0)
MCV: 89 fL (ref 80.0–100.0)
PLATELETS: 290 10*3/uL (ref 150–440)
RBC: 2.72 MIL/uL — AB (ref 4.40–5.90)
RDW: 21.8 % — AB (ref 11.5–14.5)
WBC: 10 10*3/uL (ref 3.8–10.6)

## 2016-04-26 LAB — HEMOGLOBIN A1C: Hgb A1c MFr Bld: 5.6 % (ref 4.0–6.0)

## 2016-04-26 MED ORDER — POLYETHYLENE GLYCOL 3350 17 G PO PACK
17.0000 g | PACK | Freq: Every day | ORAL | Status: DC | PRN
  Administered 2016-05-01: 17 g via ORAL
  Filled 2016-04-26: qty 1

## 2016-04-26 MED ORDER — SENNOSIDES-DOCUSATE SODIUM 8.6-50 MG PO TABS
2.0000 | ORAL_TABLET | Freq: Two times a day (BID) | ORAL | Status: DC
  Administered 2016-04-26 – 2016-05-02 (×9): 2 via ORAL
  Filled 2016-04-26 (×9): qty 2
  Filled 2016-04-26: qty 1

## 2016-04-26 MED ORDER — CEFAZOLIN IN D5W 1 GM/50ML IV SOLN
1.0000 g | INTRAVENOUS | Status: AC
  Filled 2016-04-26: qty 50

## 2016-04-26 NOTE — Progress Notes (Signed)
[place] [place] NAME: [name]    MR#:  [id number]  DATE OF BIRTH:  09-Aug-1961  SUBJECTIVE:  CHIEF COMPLAINT:  No chief complaint on file.  -Spanish speaking patient, can also speak [place]. Admitted with right foot second, third and fourth toes gangrene status post excisional debridement of necrotic tissue and wound VAC in place - for  dialysis today. Denies any pain. -Plan for angiogram tomorrow and transmetatarsal amputation the day after  REVIEW OF SYSTEMS:  Review of Systems  Constitutional: Negative for fever, chills and malaise/fatigue.  HENT: Negative for ear discharge, ear pain and nosebleeds.   Eyes: Negative for blurred vision and double vision.  Respiratory: Negative for cough, shortness of breath and wheezing.   Cardiovascular: Negative for chest pain, palpitations and leg swelling.  Gastrointestinal: Negative for nausea, vomiting, abdominal pain, diarrhea and constipation.  Genitourinary: Negative for dysuria.  Musculoskeletal: Positive for joint pain. Negative for myalgias.       Right foot pain  Neurological: Negative for dizziness, sensory change, speech change, focal weakness, seizures and headaches.  Psychiatric/Behavioral: Negative for depression.    DRUG ALLERGIES:  No Known Allergies  VITALS:  Blood pressure 136/87, pulse 76, temperature 98.2 F (36.8 C), temperature source Oral, resp. rate 20, height 5\' 7"  (1.702 m), weight 117.3 kg (258 lb 9.6 oz), SpO2 94 %.  PHYSICAL EXAMINATION:  Physical Exam  GENERAL:  55 y.o.-year-old patient lying in the bed with no acute distress.  EYES: Pupils equal, round, reactive to light and accommodation. No scleral icterus. Extraocular muscles intact.  HEENT: Head atraumatic, normocephalic. Oropharynx and nasopharynx clear.  NECK:  Supple, no jugular venous distention. No thyroid enlargement, no tenderness.  LUNGS: Normal breath sounds bilaterally, no wheezing,  rales,rhonchi or crepitation. No use of accessory muscles of respiration.  CARDIOVASCULAR: S1, S2 normal. No murmurs, rubs, or gallops.  ABDOMEN: Soft, nontender, nondistended. Bowel sounds present. No organomegaly or mass.  EXTREMITIES: Left BKA, right fourth toes amputated, has a wound VAC in place. Some swelling of the foot noted. Left arm AV fistula present NEUROLOGIC: Cranial nerves II through XII are intact. Muscle strength 5/5 in all extremities except limited in right foot due to recent surgery. Left leg is status post BKA. Sensation intact. Gait not checked.  PSYCHIATRIC: The patient is alert and oriented x 3.  SKIN: No obvious rash, lesion, or ulcer.    LABORATORY PANEL:   CBC  Recent Labs Lab 04/26/16 0459  WBC 10.0  HGB 7.5*  HCT 24.3*  PLT 290   ------------------------------------------------------------------------------------------------------------------  Chemistries   Recent Labs Lab 04/26/16 0459  NA 139  K 4.9  CL 97*  CO2 32  GLUCOSE 178*  BUN 49*  CREATININE 7.05*  CALCIUM 8.3*   ------------------------------------------------------------------------------------------------------------------  Cardiac Enzymes No results for input(s): TROPONINI in the last 168 hours. ------------------------------------------------------------------------------------------------------------------  RADIOLOGY:  No results found.  EKG:   Orders placed or performed during the hospital encounter of 04/25/16  . EKG 12-Lead  . EKG 12-Lead    ASSESSMENT AND PLAN:   55 year old male with past medical history significant for end-stage renal disease on Tuesday Thursday Saturday hemodialysis, diabetes mellitus, peripheral vascular disease status post left BKA in the past admitted with necrosis of right foot second, third and fourth toes.  #1 right foot necrotic toes-status post excisional debridement of second third and fourth dose on 04/25/2016. -Appreciate vascular  and podiatry consults -For angiogram tomorrow and transmetatarsal amputation the day  after. -Continue vancomycin for now. Has a wound VAC in place.  #2 end-stage renal disease on hemodialysis-on Tuesday, Thursday and Saturday schedule. Nephrology consulted. -For dialysis today -Also takes midodrine at home  #3 diabetes mellitus-check A1c. Sugars are decently controlled. -Continue sliding scale insulin  #4 PVD-for angiogram of right leg tomorrow. -Continue aspirin.  #5 DVT prophylaxis-subcutaneous heparin.  After his procedures over the weekend, he will need physical therapy consult and then discharge planning   All the records are reviewed and case discussed with Care Management/Social Workerr. Management plans discussed with the patient, family and they are in agreement.  CODE STATUS: Full code  TOTAL TIME TAKING CARE OF THIS PATIENT: 37 minutes.   POSSIBLE D/C IN 3-4 DAYS, DEPENDING ON CLINICAL CONDITION.   Gladstone Lighter M.D on 04/26/2016 at 1:21 PM  Between 7am to 6pm - Pager - [phone]  After 6pm go to [email or web address] - password [email or web address] [facility]  Office  [phone]  CC: Primary care physician; PROVIDER NOT IN SYSTEM

## 2016-04-26 NOTE — Progress Notes (Signed)
[facility]  Daily Progress Note   Subjective: 1 Day Post-Op: Amputation of the second third and fourth toes through the MP joint and excisional debridement of necrotic tissue including skin and subcutaneous tissues and tendon and bone first and fifth rays.  Patient seen and examined during hemodialysis. Resting comfortably in bed without complaint.   Objective: Filed Vitals:   04/26/16 1131 04/26/16 1235 04/26/16 1239 04/26/16 1245  BP: 142/80 133/95 136/91   Pulse: 77 76 75 76  Temp: 98 F (36.7 C) 98.2 F (36.8 C)    TempSrc: Oral Oral    Resp: 18 22 20 20   Height:      Weight:  117.3 kg (258 lb 9.6 oz)    SpO2: 94% 94% 94% 94%    Intake/Output Summary (Last 24 hours) at 04/26/16 1257 Last data filed at 04/26/16 0900  Gross per 24 hour  Intake    440 ml  Output      0 ml  Net    440 ml   Physical Exam: A&Ox3, NAD CV: RRR Pulmonary: CTA Bilaterally Abdomen: Soft, Nontender, Nondistended Vascular:  Left: BKA - stable no skin breakdown.  Right Extremity: VAC in place, to suction - no leak. Extremity warm. Minimal swelling.    Laboratory: CBC    Component Value Date/Time   WBC 10.0 04/26/2016 0459   HGB 7.5* 04/26/2016 0459   HCT 24.3* 04/26/2016 0459   PLT 290 04/26/2016 0459   BMET    Component Value Date/Time   NA 139 04/26/2016 0459   K 4.9 04/26/2016 0459   K 6.6* 03/10/2013 1351   CL 97* 04/26/2016 0459   CO2 32 04/26/2016 0459   GLUCOSE 178* 04/26/2016 0459   BUN 49* 04/26/2016 0459   CREATININE 7.05* 04/26/2016 0459   CALCIUM 8.3* 04/26/2016 0459   GFRNONAA 8* 04/26/2016 0459   GFRAA 9* 04/26/2016 0459   Assessment/Planning: 55 year old male s/p 1 Day Post-Op: Amputation of the second third and fourth toes through the MP joint and excisional debridement of necrotic tissue including skin and subcutaneous tissues and tendon and bone first and fifth rays for aherosclerotic occlusive disease bilateral lower extremities with  gangrene of the right forefoot.  1) Vac in place to suction. First VAC change on Friday or Saturday. 2) Will undergo RLE angiogram on Friday with Dr. Delana Meyer 3) Dialysis as per Nephrology.  Marcelle Overlie PA-C 04/26/2016 12:57 PM

## 2016-04-26 NOTE — Progress Notes (Signed)
Post hd tx 

## 2016-04-26 NOTE — Consult Note (Signed)
[facility]  CONSULT NOTE    Date: 04/26/2016                  Patient Name:  [name]  MRN: [id number]  DOB: [birth year]  Age / Sex: 55 y.o., male         PCP: PROVIDER NOT IN SYSTEM                 Service Requesting Consult: Dr. Leslye Peer                 Reason for Consult: End Stage Renal Disease            History of Present Illness: [name] is a 55 y.o. Hispanic male with End Stage Renal Disease on hemodialysis since 2008, diabetes mellitus type II, CVA, PVD, status post left BKA, hyperlipidemia, who was admitted to [facility] on 04/25/2016 for ASO W GANGRENE  Patient underwent amputation of second, third and fourth toes through the MP joint. Admitted and will have angioplasty Friday. Possible further amputation scheduled for Saturday.    Medications: Outpatient medications: Prescriptions prior to admission  Medication Sig Dispense Refill Last Dose  . sevelamer carbonate (RENVELA) 800 MG tablet Take 800 mg by mouth 3 (three) times daily with meals.   04/24/2016 at Unknown time  . aspirin 325 MG tablet Take 325 mg by mouth daily.   04/23/2016  . cinacalcet (SENSIPAR) 30 MG tablet Take 60 mg by mouth daily.    04/23/2016  . glipiZIDE (GLUCOTROL) 10 MG tablet Take 10 mg by mouth daily before breakfast.   04/23/2016  . midodrine (PROAMATINE) 10 MG tablet Take 10 mg by mouth 3 (three) times daily.   04/24/2016    Current medications: Current Facility-Administered Medications  Medication Dose Route Frequency Provider Last Rate Last Dose  . acetaminophen (TYLENOL) tablet 650 mg  650 mg Oral Q6H PRN Katha Cabal, MD       Or  . acetaminophen (TYLENOL) suppository 650 mg  650 mg Rectal Q6H PRN Katha Cabal, MD      . aspirin EC tablet 81 mg  81 mg Oral Daily Katha Cabal, MD   81 mg at 04/25/16 1559  . cinacalcet (SENSIPAR) tablet 60 mg  60 mg Oral Q breakfast Katha Cabal, MD   60 mg at 04/26/16 0834  . docusate sodium (COLACE)  capsule 100 mg  100 mg Oral BID Katha Cabal, MD   100 mg at 04/25/16 2241  . heparin injection 5,000 Units  5,000 Units Subcutaneous Q8H Katha Cabal, MD   5,000 Units at 04/26/16 0557  . HYDROcodone-acetaminophen (NORCO/VICODIN) 5-325 MG per tablet 1-2 tablet  1-2 tablet Oral Q6H PRN Katha Cabal, MD      . insulin aspart (novoLOG) injection 0-5 Units  0-5 Units Subcutaneous QHS Loletha Grayer, MD   0 Units at 04/25/16 2242  . insulin aspart (novoLOG) injection 0-9 Units  0-9 Units Subcutaneous TID WC Loletha Grayer, MD   1 Units at 04/26/16 [phone]  . midodrine (PROAMATINE) tablet 10 mg  10 mg Oral TID PC Katha Cabal, MD   10 mg at 04/26/16 0834  . morphine 2 MG/ML injection 2 mg  2 mg Intravenous Q1H PRN Katha Cabal, MD      . ondansetron [facility]) tablet 4 mg  4 mg Oral Q6H PRN Katha Cabal, MD       Or  . ondansetron [facility])  injection 4 mg  4 mg Intravenous Q6H PRN Katha Cabal, MD      . sevelamer carbonate (RENVELA) tablet 800 mg  800 mg Oral TID WC Katha Cabal, MD   800 mg at 04/26/16 0834  . sodium phosphate (FLEET) 7-19 GM/118ML enema 1 enema  1 enema Rectal Once PRN Katha Cabal, MD      . sorbitol 70 % solution 30 mL  30 mL Oral Daily PRN Katha Cabal, MD      . vancomycin (VANCOCIN) IVPB 1000 mg/200 mL premix  1,000 mg Intravenous Q T,Th,Sa-HD Jody C Barefoot, RPH          Allergies: No Known Allergies    Past Medical History: Past Medical History  Diagnosis Date  . Chronic kidney disease   . Hypertension   . Diabetes mellitus without complication ([place])   . Hypercholesteremia   . Atherosclerosis   . Stroke ([place])   . Anemia      Past Surgical History: Past Surgical History  Procedure Laterality Date  . Av fistula placement Left 2015  . Peripheral vascular catheterization N/A 10/03/2015    Procedure: A/V Shuntogram/Fistulagram;  Surgeon: [name], MD;  Location: [place] CV [facility];  Service: Cardiovascular;   Laterality: N/A;  . Peripheral vascular catheterization N/A 01/16/2016    Procedure: Abdominal Aortogram w/Lower Extremity;  Surgeon: [name], MD;  Location: [place] [facility];  Service: Cardiovascular;  Laterality: N/A;  . Peripheral vascular catheterization  01/16/2016    Procedure: Lower Extremity Intervention;  Surgeon: [name], MD;  Location: [place] CV [facility];  Service: Cardiovascular;;  . Leg amputation below knee Left   . Wound debridement Right 04/25/2016    Procedure: DEBRIDEMENT WOUND;  Surgeon: Katha Cabal, MD;  Location: [facility] ORS;  Service: Vascular;  Laterality: Right;  . Application of wound vac Right 04/25/2016    Procedure: APPLICATION OF WOUND VAC;  Surgeon: Katha Cabal, MD;  Location: [facility] ORS;  Service: Vascular;  Laterality: Right;  . Amputation Right 04/25/2016    Procedure: TOE AMPUTATION;  Surgeon: Katha Cabal, MD;  Location: [facility] ORS;  Service: Vascular;  Laterality: Right;     Family History: Family History  Problem Relation Age of Onset  . Prostate cancer Mother   . Diabetes Father      Social History: Social History   Social History  . Marital Status: Married    Spouse Name: N/A  . Number of Children: N/A  . Years of Education: N/A   Occupational History  . Not on file.   Social History Main Topics  . Smoking status: Never Smoker   . Smokeless tobacco: Not on file  . Alcohol Use: No  . Drug Use: No  . Sexual Activity: Not on file   Other Topics Concern  . Not on file   Social History Narrative     Review of Systems: Review of Systems  Constitutional: Negative.  Negative for fever, chills, weight loss, malaise/fatigue and diaphoresis.  HENT: Negative.  Negative for congestion, ear discharge, ear pain, hearing loss, nosebleeds, sore throat and tinnitus.   Eyes: Negative.  Negative for blurred vision, double vision, photophobia, pain, discharge and redness.  Respiratory: Negative.  Negative for cough, hemoptysis,  sputum production, shortness of breath, wheezing and stridor.   Cardiovascular: Positive for leg swelling. Negative for chest pain, palpitations, orthopnea, claudication and PND.  Gastrointestinal: Negative.  Negative for heartburn, nausea, vomiting, abdominal pain, diarrhea,  constipation, blood in stool and melena.  Genitourinary: Negative.  Negative for dysuria, urgency, frequency, hematuria and flank pain.  Musculoskeletal: Negative.  Negative for myalgias, back pain, joint pain, falls and neck pain.  Skin: Negative.  Negative for itching and rash.  Neurological: Negative.  Negative for dizziness, tingling, tremors, sensory change, speech change, focal weakness, seizures, loss of consciousness, weakness and headaches.  Endo/Heme/Allergies: Negative.  Negative for environmental allergies and polydipsia. Does not bruise/bleed easily.  Psychiatric/Behavioral: Negative.  Negative for depression, suicidal ideas, hallucinations, memory loss and substance abuse. The patient is not nervous/anxious and does not have insomnia.     Vital Signs: Blood pressure 143/69, pulse 73, temperature 98.2 F (36.8 C), temperature source Oral, resp. rate 21, height 5\' 7"  (1.702 m), weight 112.537 kg (248 lb 1.6 oz), SpO2 92 %.  Weight trends: Filed Weights   04/25/16 0934 04/26/16 0443  Weight: 112.946 kg (249 lb) 112.537 kg (248 lb 1.6 oz)    Physical Exam: General: NAD, laying in bed  Head: Normocephalic, atraumatic. Moist oral mucosal membranes  Eyes: Anicteric, PERRL  Neck: Supple, trachea midline  Lungs:  Clear to auscultation  Heart: Regular rate and rhythm  Abdomen:  Soft, nontender,   Extremities: Left BKA, right foot in wound vac  Neurologic: Nonfocal, moving all four extremities  Skin: No lesions  Access: Left AVF     Lab results: Basic Metabolic Panel:  Recent Labs Lab 04/23/16 1503 04/25/16 0945 04/25/16 1513 04/26/16 0459  NA 144 139  --  139  K 4.2 4.1  --  4.9  CL 99*  --    --  97*  CO2 33*  --   --  32  GLUCOSE 180* 166*  --  178*  BUN 45*  --   --  49*  CREATININE 6.87*  --  5.90* 7.05*  CALCIUM 7.8*  --   --  8.3*    Liver Function Tests: No results for input(s): AST, ALT, ALKPHOS, BILITOT, PROT, ALBUMIN in the last 168 hours. No results for input(s): LIPASE, AMYLASE in the last 168 hours. No results for input(s): AMMONIA in the last 168 hours.  CBC:  Recent Labs Lab 04/23/16 1503 04/25/16 0945 04/25/16 1513 04/26/16 0459  WBC 10.9*  --  9.8 10.0  NEUTROABS 7.9*  --   --   --   HGB 8.0* 8.8* 8.1* 7.5*  HCT 25.3* 26.0* 26.3* 24.3*  MCV 87.4  --  88.1 89.0  PLT 308  --  308 290    Cardiac Enzymes: No results for input(s): CKTOTAL, CKMB, CKMBINDEX, TROPONINI in the last 168 hours.  BNP: Invalid input(s): POCBNP  CBG:  Recent Labs Lab 04/25/16 1349 04/25/16 1639 04/25/16 2120 04/25/16 2239 04/26/16 0740  GLUCAP 116* 160* 52* 184* 150*    Microbiology: Results for orders placed or performed during the hospital encounter of 04/23/16  Surgical pcr screen     Status: None   Collection Time: 04/23/16  3:03 PM  Result Value Ref Range Status   MRSA, PCR NEGATIVE NEGATIVE Final   Staphylococcus aureus NEGATIVE NEGATIVE Final    Comment:        The Xpert SA Assay (FDA approved for NASAL specimens in patients over 88 years of age), is one component of a comprehensive surveillance program.  Test performance has been validated by [facility] for patients greater than or equal to 74 year old. It is not intended to diagnose infection nor to guide or monitor treatment.  Coagulation Studies:  Recent Labs  04/23/16 1503  LABPROT 16.4*  INR 1.31    Urinalysis: No results for input(s): COLORURINE, LABSPEC, PHURINE, GLUCOSEU, HGBUR, BILIRUBINUR, KETONESUR, PROTEINUR, UROBILINOGEN, NITRITE, LEUKOCYTESUR in the last 72 hours.  Invalid input(s): APPERANCEUR    Imaging:  No results found.   Assessment & Plan: Mr. JEWETT SANTANNA is a 55 y.o. Hispanic male with End Stage Renal Disease on hemodialysis since 2008, diabetes mellitus type II, CVA, PVD, status post left BKA, hyperlipidemia, who was admitted to [facility] on 04/25/2016 for [facility] [place].   1. End Stage Renal Disease: dialysis for later today. Continue TTS schedule.   2. Hypotension: blood pressure at goal.  - midodrine before treatment.   3. Diabetes mellitus type II with chronic kidney disease - glipizide.   4. Secondary Hyperparathyroidism:  - cinacalcet.   LOS: [place], [place] 7/13/201710:46 AM

## 2016-04-26 NOTE — Progress Notes (Signed)
Pre Dialysis 

## 2016-04-26 NOTE — Progress Notes (Signed)
Dialysis started 

## 2016-04-26 NOTE — Progress Notes (Signed)
Hemodialysis completed. 

## 2016-04-27 ENCOUNTER — Ambulatory Visit: Admission: RE | Admit: 2016-04-27 | Payer: Medicare Other | Source: Ambulatory Visit | Admitting: Vascular Surgery

## 2016-04-27 ENCOUNTER — Encounter
Admission: RE | Disposition: A | Discharge status: Skilled Nursing Facility | Payer: Self-pay | Source: Ambulatory Visit | Attending: Internal Medicine

## 2016-04-27 HISTORY — PX: PERIPHERAL VASCULAR CATHETERIZATION: SHX172C

## 2016-04-27 LAB — CBC
HCT: 24.3 % — ABNORMAL LOW (ref 40.0–52.0)
HEMOGLOBIN: 7.6 g/dL — AB (ref 13.0–18.0)
MCH: 27.6 pg (ref 26.0–34.0)
MCHC: 31.2 g/dL — ABNORMAL LOW (ref 32.0–36.0)
MCV: 88.6 fL (ref 80.0–100.0)
Platelets: 253 10*3/uL (ref 150–440)
RBC: 2.75 MIL/uL — AB (ref 4.40–5.90)
RDW: 21.2 % — ABNORMAL HIGH (ref 11.5–14.5)
WBC: 9.9 10*3/uL (ref 3.8–10.6)

## 2016-04-27 LAB — BASIC METABOLIC PANEL
ANION GAP: 9 (ref 5–15)
BUN: 36 mg/dL — ABNORMAL HIGH (ref 6–20)
CHLORIDE: 96 mmol/L — AB (ref 101–111)
CO2: 31 mmol/L (ref 22–32)
Calcium: 8 mg/dL — ABNORMAL LOW (ref 8.9–10.3)
Creatinine, Ser: 5.62 mg/dL — ABNORMAL HIGH (ref 0.61–1.24)
GFR calc non Af Amer: 10 mL/min — ABNORMAL LOW (ref >60)
GFR, EST AFRICAN AMERICAN: 12 mL/min — AB (ref >60)
Glucose, Bld: 131 mg/dL — ABNORMAL HIGH (ref 65–99)
POTASSIUM: 4.6 mmol/L (ref 3.5–5.1)
Sodium: 136 mmol/L (ref 135–145)

## 2016-04-27 LAB — GLUCOSE, CAPILLARY
GLUCOSE-CAPILLARY: 185 mg/dL — AB (ref 65–99)
Glucose-Capillary: 132 mg/dL — ABNORMAL HIGH (ref 65–99)
Glucose-Capillary: 123 mg/dL — ABNORMAL HIGH (ref 65–99)

## 2016-04-27 LAB — SURGICAL PATHOLOGY

## 2016-04-27 SURGERY — LOWER EXTREMITY ANGIOGRAPHY
Anesthesia: Moderate Sedation | Site: Leg Lower | Laterality: Right

## 2016-04-27 MED ORDER — MIDAZOLAM HCL 5 MG/5ML IJ SOLN
INTRAMUSCULAR | Status: AC
  Filled 2016-04-27: qty 5

## 2016-04-27 MED ORDER — LIDOCAINE HCL (PF) 1 % IJ SOLN
INTRAMUSCULAR | Status: AC
  Filled 2016-04-27: qty 10

## 2016-04-27 MED ORDER — FENTANYL CITRATE (PF) 100 MCG/2ML IJ SOLN
INTRAMUSCULAR | Status: AC
  Filled 2016-04-27: qty 2

## 2016-04-27 MED ORDER — SODIUM CHLORIDE 0.9 % IV SOLN: INTRAVENOUS | Status: DC

## 2016-04-27 MED ORDER — FAMOTIDINE 20 MG PO TABS: 40.0000 mg | ORAL_TABLET | ORAL | Status: DC | PRN

## 2016-04-27 MED ORDER — CEFUROXIME SODIUM 1.5 G IJ SOLR: 1.5000 g | INTRAMUSCULAR | Status: DC

## 2016-04-27 MED ORDER — METHYLPREDNISOLONE SODIUM SUCC 125 MG IJ SOLR: 125.0000 mg | INTRAMUSCULAR | Status: DC | PRN

## 2016-04-27 MED ORDER — HEPARIN SODIUM (PORCINE) 1000 UNIT/ML IJ SOLN
INTRAMUSCULAR | Status: DC | PRN
  Administered 2016-04-27: 5000 [IU] via INTRAVENOUS

## 2016-04-27 MED ORDER — MIDAZOLAM HCL 2 MG/2ML IJ SOLN
INTRAMUSCULAR | Status: DC | PRN
  Administered 2016-04-27: 1 mg via INTRAVENOUS
  Administered 2016-04-27: 2 mg via INTRAVENOUS
  Administered 2016-04-27: 1 mg via INTRAVENOUS

## 2016-04-27 MED ORDER — ONDANSETRON HCL 4 MG/2ML IJ SOLN: 4.0000 mg | Freq: Four times a day (QID) | INTRAMUSCULAR | Status: DC | PRN

## 2016-04-27 MED ORDER — FENTANYL CITRATE (PF) 100 MCG/2ML IJ SOLN
INTRAMUSCULAR | Status: DC | PRN
  Administered 2016-04-27: 50 ug via INTRAVENOUS
  Administered 2016-04-27 (×2): 25 ug via INTRAVENOUS

## 2016-04-27 MED ORDER — HYDROMORPHONE HCL 1 MG/ML IJ SOLN: 1.0000 mg | Freq: Once | INTRAMUSCULAR | Status: DC

## 2016-04-27 MED ORDER — HEPARIN (PORCINE) IN NACL 2-0.9 UNIT/ML-% IJ SOLN
INTRAMUSCULAR | Status: AC
  Filled 2016-04-27: qty 1000

## 2016-04-27 MED ORDER — HEPARIN SODIUM (PORCINE) 1000 UNIT/ML IJ SOLN
INTRAMUSCULAR | Status: AC
  Filled 2016-04-27: qty 1

## 2016-04-27 MED ORDER — IOPAMIDOL (ISOVUE-300) INJECTION 61%
INTRAVENOUS | Status: DC | PRN
  Administered 2016-04-27: 70 mL via INTRA_ARTERIAL

## 2016-04-27 SURGICAL SUPPLY — 23 items
BALLN ULTRVRSE 2X100X150 (BALLOONS) ×4
BALLOON ULTRVRSE 2X100X150 (BALLOONS) ×2 IMPLANT
CATH CROSSER S6 154CM (CATHETERS) ×4 IMPLANT
CATH CXI SUPP ANG 2.6FR 150CM (MICROCATHETER) ×4 IMPLANT
CATH CXI SUPP ANG 4FR 135 (MICROCATHETER) ×2 IMPLANT
CATH CXI SUPP ANG 4FR 135CM (MICROCATHETER) ×4
CATH GWIRE MARINER STRGHT 4FR (CATHETERS) ×4 IMPLANT
CATH PIG 70CM (CATHETERS) ×4 IMPLANT
CATH USHER TPER 130CM (CATHETERS) ×4 IMPLANT
DEVICE PRESTO INFLATION (MISCELLANEOUS) ×4 IMPLANT
DEVICE STARCLOSE SE CLOSURE (Vascular Products) ×4 IMPLANT
DEVICE TORQUE (MISCELLANEOUS) ×4 IMPLANT
GLIDEWIRE ANGLED SS 035X260CM (WIRE) ×4 IMPLANT
GUIDEWIRE SUPER STIFF .035X180 (WIRE) ×4 IMPLANT
KIT FLOWMATE PROCEDURAL (MISCELLANEOUS) ×4 IMPLANT
PACK ANGIOGRAPHY (CUSTOM PROCEDURE TRAY) ×4 IMPLANT
SET INTRO CAPELLA COAXIAL (SET/KITS/TRAYS/PACK) ×4 IMPLANT
SHEATH BRITE TIP 5FRX11 (SHEATH) ×4 IMPLANT
SHEATH RAABE 7FR (SHEATH) ×4 IMPLANT
VALVE HEMO TOUHY BORST Y (VALVE) ×4 IMPLANT
WIRE G V18X300CM (WIRE) ×4 IMPLANT
WIRE J 3MM .035X145CM (WIRE) ×4 IMPLANT
WIRE SPARTACORE .014X300CM (WIRE) ×4 IMPLANT

## 2016-04-27 NOTE — Care Management (Signed)
Spoke with patient with [place] interpreter.  Patient admitted with right foot second, third and fourth toes gangrene status post excisional debridement of necrotic tissue. Patient is chronic HD patient.  Patient lives at home with wife.  States that he has an IT trainer WC, walker, and cane in the home.  Patient uses [employer] for transportation.    Avance wound vac in box at bedside. Avance wound vac was initially provided by Jeneen Rinks from [place].  The intent was for the patient to be discharge from [facility], and followed by [facility] home health nursing.    I have notified Jeneen Rinks of patient admission, he is to pick up the vac. Plan for patient to return to OR 04/28/16.  Anticipate discharge to facility.

## 2016-04-27 NOTE — Progress Notes (Signed)
[facility]  ROUNDING NOTE   Subjective:   Hemodialysis treatment yesterday. Tolerated treatment well. UF of 1.5 litres.  History taken with assistance of Spanish Interpreter.   Angiogram schedule for later today.   He denies any pain  Objective:  Vital signs in last 24 hours:  Temp:  [97.8 F (36.6 C)-98.4 F (36.9 C)] 98.2 F (36.8 C) (07/14 0526) Pulse Rate:  [38-79] 79 (07/14 0526) Resp:  [16-22] 18 (07/14 0526) BP: (121-148)/(62-95) 128/79 mmHg (07/14 0526) SpO2:  [91 %-99 %] 97 % (07/14 0526) Weight:  [116.1 kg (255 lb 15.3 oz)-117.3 kg (258 lb 9.6 oz)] 116.5 kg (256 lb 13.4 oz) (07/14 0526)  Weight change: 4.354 kg (9 lb 9.6 oz) Filed Weights   04/26/16 1235 04/26/16 1542 04/27/16 0526  Weight: 117.3 kg (258 lb 9.6 oz) 116.1 kg (255 lb 15.3 oz) 116.5 kg (256 lb 13.4 oz)    Intake/Output: I/O last 3 completed shifts: In: 260 [P.O.:60; IV Piggyback:200] Out: 1200 [Other:1200]   Intake/Output this shift:     Physical Exam: General: NAD,   Head: Normocephalic, atraumatic. Moist oral mucosal membranes  Eyes: Anicteric, PERRL  Neck: Supple, trachea midline  Lungs:  Clear to auscultation  Heart: Regular rate and rhythm  Abdomen:  Soft, nontender,   Extremities: no peripheral edema.  Neurologic: Left BKA, right foot in wound vac  Skin: No lesions  Access: Left arm AVF    Basic Metabolic Panel:  Recent Labs Lab 04/23/16 1503 04/25/16 0945 04/25/16 1513 04/26/16 0459 04/27/16 0444  NA 144 139  --  139 136  K 4.2 4.1  --  4.9 4.6  CL 99*  --   --  97* 96*  CO2 33*  --   --  32 31  GLUCOSE 180* 166*  --  178* 131*  BUN 45*  --   --  49* 36*  CREATININE 6.87*  --  5.90* 7.05* 5.62*  CALCIUM 7.8*  --   --  8.3* 8.0*    Liver Function Tests: No results for input(s): AST, ALT, ALKPHOS, BILITOT, PROT, ALBUMIN in the last 168 hours. No results for input(s): LIPASE, AMYLASE in the last 168 hours. No results for input(s): AMMONIA in the last  168 hours.  CBC:  Recent Labs Lab 04/23/16 1503 04/25/16 0945 04/25/16 1513 04/26/16 0459 04/27/16 0444  WBC 10.9*  --  9.8 10.0 9.9  NEUTROABS 7.9*  --   --   --   --   HGB 8.0* 8.8* 8.1* 7.5* 7.6*  HCT 25.3* 26.0* 26.3* 24.3* 24.3*  MCV 87.4  --  88.1 89.0 88.6  PLT 308  --  308 290 253    Cardiac Enzymes: No results for input(s): CKTOTAL, CKMB, CKMBINDEX, TROPONINI in the last 168 hours.  BNP: Invalid input(s): POCBNP  CBG:  Recent Labs Lab 04/26/16 0740 04/26/16 1129 04/26/16 1624 04/26/16 2212 04/27/16 0743  GLUCAP 150* 144* 126* 157* 132*    Microbiology: Results for orders placed or performed during the hospital encounter of 04/23/16  Surgical pcr screen     Status: None   Collection Time: 04/23/16  3:03 PM  Result Value Ref Range Status   MRSA, PCR NEGATIVE NEGATIVE Final   Staphylococcus aureus NEGATIVE NEGATIVE Final    Comment:        The Xpert SA Assay (FDA approved for NASAL specimens in patients over 42 years of age), is one component of a comprehensive surveillance program.  Test performance has been validated  by [facility] for patients greater than or equal to 86 year old. It is not intended to diagnose infection nor to guide or monitor treatment.     Coagulation Studies: No results for input(s): LABPROT, INR in the last 72 hours.  Urinalysis: No results for input(s): COLORURINE, LABSPEC, PHURINE, GLUCOSEU, HGBUR, BILIRUBINUR, KETONESUR, PROTEINUR, UROBILINOGEN, NITRITE, LEUKOCYTESUR in the last 72 hours.  Invalid input(s): APPERANCEUR    Imaging: No results found.   Medications:     . aspirin EC  81 mg Oral Daily  .  ceFAZolin (ANCEF) IV  1 g Intravenous On Call  . cinacalcet  60 mg Oral Q breakfast  . heparin  5,000 Units Subcutaneous Q8H  . insulin aspart  0-5 Units Subcutaneous QHS  . insulin aspart  0-9 Units Subcutaneous TID WC  . midodrine  10 mg Oral TID PC  . senna-docusate  2 tablet Oral BID  . sevelamer  carbonate  800 mg Oral TID WC  . vancomycin  1,000 mg Intravenous Q T,Th,Sa-HD   acetaminophen **OR** acetaminophen, HYDROcodone-acetaminophen, morphine injection, ondansetron **OR** ondansetron (ZOFRAN) IV, polyethylene glycol, sodium phosphate, sorbitol  Assessment/ Plan:  [name] is a 55 y.o. Hispanic male with End Stage Renal Disease on hemodialysis since 2008, diabetes mellitus type II, CVA, PVD, status post left BKA, hyperlipidemia, who was admitted to [facility] on 04/25/2016 with peripheral vascular disease  [facility] [place].   1. End Stage Renal Disease: hemodialysis treatment yesterday. Tolerated treatment well. Continue TTS schedule.   2. Hypotension: blood pressure at goal.  - midodrine before treatment.   3. Diabetes mellitus type II with chronic kidney disease - glipizide.   4. Secondary Hyperparathyroidism:  - cinacalcet.    LOS: 2 Ahmaud Duthie 7/14/20179:59 AM

## 2016-04-27 NOTE — Care Management Important Message (Signed)
Important Message  Patient Details  Name: [name] MRN: [id number] Date of Birth: [birth year]   Medicare Important Message Given:  Yes    [name] 04/27/2016, 1:41 PM

## 2016-04-27 NOTE — Progress Notes (Signed)
Pharmacy Antibiotic Note  ASHTYN FRASIER is a 55 y.o. male admitted on 04/25/2016 with wound infection.  Pt is on HD every T-Th-Sat. Pharmacy has been consulted for Vancomycin dosing.  Planing for transmetatarsal amputation this weekend.   Plan: Continue vancomycin 1gm IV QHD, scheduled for Tues, Thurs, Sat HD. Will check pre-HD trough prior to third HD session planned for 7/18.  Target pre HD vancomycin trough 15-31mcg/ml  Height: 5\' 7"  (170.2 cm) Weight: 256 lb 13.4 oz (116.5 kg) IBW/kg (Calculated) : 66.1  Temp (24hrs), Avg:98.1 F (36.7 C), Min:97.8 F (36.6 C), Max:98.4 F (36.9 C)   Recent Labs Lab 04/23/16 1503 04/25/16 1513 04/26/16 0459 04/27/16 0444  WBC 10.9* 9.8 10.0 9.9  CREATININE 6.87* 5.90* 7.05* 5.62*    Estimated Creatinine Clearance: 17.9 mL/min (by C-G formula based on Cr of 5.62).    No Known Allergies  Antimicrobials this admission:  Vancomycin 7/12 >>    Dose adjustments this admission:   Microbiology results:   Thank you for allowing pharmacy to be a part of this patient's care.  Banks Chaikin C 04/27/2016 9:15 AM

## 2016-04-27 NOTE — Progress Notes (Addendum)
[name] [place] NAME: [name]    MR#:  [id number]  DATE OF BIRTH:  November 04, 1960  SUBJECTIVE:  CHIEF COMPLAINT:  No chief complaint on file.  -Spanish speaking patient, used an interpreter. - for angiogram to right leg today. right foot second, third and fourth toes gangrene status post excisional debridement of necrotic tissue and wound VAC in place - Denies any pain. -Plan for  transmetatarsal amputation tomorrow  REVIEW OF SYSTEMS:  Review of Systems  Constitutional: Negative for fever, chills and malaise/fatigue.  HENT: Negative for ear discharge, ear pain and nosebleeds.   Eyes: Negative for blurred vision and double vision.  Respiratory: Negative for cough, shortness of breath and wheezing.   Cardiovascular: Negative for chest pain, palpitations and leg swelling.  Gastrointestinal: Negative for nausea, vomiting, abdominal pain, diarrhea and constipation.  Genitourinary: Negative for dysuria.  Musculoskeletal: Positive for joint pain. Negative for myalgias.       Right foot pain  Neurological: Negative for dizziness, sensory change, speech change, focal weakness, seizures and headaches.  Psychiatric/Behavioral: Negative for depression.    DRUG ALLERGIES:  No Known Allergies  VITALS:  Blood pressure 135/78, pulse 79, temperature 98.2 F (36.8 C), temperature source Oral, resp. rate 17, height 5\' 7"  (1.702 m), weight 116.5 kg (256 lb 13.4 oz), SpO2 92 %.  PHYSICAL EXAMINATION:  Physical Exam  GENERAL:  55 y.o.-year-old patient lying in the bed with no acute distress.  EYES: Pupils equal, round, reactive to light and accommodation. No scleral icterus. Extraocular muscles intact.  HEENT: Head atraumatic, normocephalic. Oropharynx and nasopharynx clear.  NECK:  Supple, no jugular venous distention. No thyroid enlargement, no tenderness.  LUNGS: Normal breath sounds bilaterally, no wheezing, rales,rhonchi or  crepitation. No use of accessory muscles of respiration.  CARDIOVASCULAR: S1, S2 normal. No murmurs, rubs, or gallops.  ABDOMEN: Soft, nontender, nondistended. Bowel sounds present. No organomegaly or mass.  EXTREMITIES: Left BKA, right fourth toes amputated, has a wound VAC in place. Some swelling of the foot noted. Left arm AV fistula present NEUROLOGIC: Cranial nerves II through XII are intact. Muscle strength 5/5 in all extremities except limited in right foot due to recent surgery. Left leg is status post BKA. Sensation intact. Gait not checked.  PSYCHIATRIC: The patient is alert and oriented x 3.  SKIN: No obvious rash, lesion, or ulcer.    LABORATORY PANEL:   CBC  Recent Labs Lab 04/27/16 0444  WBC 9.9  HGB 7.6*  HCT 24.3*  PLT 253   ------------------------------------------------------------------------------------------------------------------  Chemistries   Recent Labs Lab 04/27/16 0444  NA 136  K 4.6  CL 96*  CO2 31  GLUCOSE 131*  BUN 36*  CREATININE 5.62*  CALCIUM 8.0*   ------------------------------------------------------------------------------------------------------------------  Cardiac Enzymes No results for input(s): TROPONINI in the last 168 hours. ------------------------------------------------------------------------------------------------------------------  RADIOLOGY:  No results found.  EKG:   Orders placed or performed during the hospital encounter of 04/25/16  . EKG 12-Lead  . EKG 12-Lead    ASSESSMENT AND PLAN:   55 year old male with past medical history significant for end-stage renal disease on Tuesday Thursday Saturday hemodialysis, diabetes mellitus, peripheral vascular disease status post left BKA in the past admitted with necrosis of right foot second, third and fourth toes.  #1 right foot necrotic toes-status post excisional debridement of second third and fourth dose on 04/25/2016. -Appreciate vascular and podiatry  consults -For angiogram today and transmetatarsal amputation tomorrow. -Continue vancomycin for now. Has  a wound VAC in place.  #2 end-stage renal disease on hemodialysis-on Tuesday, Thursday and Saturday schedule. Nephrology consulted. -For dialysis tomorrow -Also takes midodrine at home  #3 diabetes mellitus-A1c is only 5.6. Sugars are decently controlled. -Continue sliding scale insulin  #4 PVD-for angiogram of right leg today. -Continue aspirin.  #5 acute on chronic anemia-anemia of chronic kidney disease. No transfusion indicated unless hemoglobin less than 7. -Monitor at this time. Epogen with dialysis.  #6 DVT prophylaxis-subcutaneous heparin.  After his procedures over the weekend, he will need physical therapy consult and then discharge planning Likely might need SNF- social worker involved   All the records are reviewed and case discussed with Care Management/Social Workerr. Management plans discussed with the patient, family and they are in agreement.  CODE STATUS: Full code  TOTAL TIME TAKING CARE OF THIS PATIENT: 32 minutes.   POSSIBLE D/C IN 3-4 DAYS, DEPENDING ON CLINICAL CONDITION.   Gladstone Lighter M.D on 04/27/2016 at 12:01 PM  Between 7am to 6pm - Pager - [phone]  After 6pm go to [email or web address] - password [email or web address] [facility]  Office  [phone]  CC: Primary care physician; PROVIDER NOT IN SYSTEM

## 2016-04-27 NOTE — Op Note (Signed)
[facility] Percutaneous Study/Intervention Procedural Note   Date of Surgery: 04/27/2016  Surgeon:  Katha Cabal, MD.  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 Introduction catheter into right lower extremity 3rd order catheter placement  2. Contrast injection right lower extremity for distal runoff   3. Crosser atherectomy of the peroneal and posterior tibial arteries 4.  Percutaneous transluminal angioplasty to 2 mm right peroneal             5.   Additional third order catheter placement catheters positioned in both the posterior tibial and the peroneal             6.   Star close closure left common femoral arteriotomy                Anesthesia: Conscious sedation was administered under my direct supervision. IV Versed plus fentanyl were utilized. Continuous ECG, pulse oximetry and blood pressure was monitored throughout the entire procedure. Conscious sedation was  for a total of 1 hour 20 minutes.  Sheath: 7 [place] Rabi left common femoral artery  Contrast: 70 cc  Fluoroscopy Time: 20.3 minutes  Indications: [name] presents with gangrene of the right foot The risks and benefits are reviewed all questions answered patient agrees to proceed.  Procedure: [name] is a 55 y.o. y.o. male who was identified and appropriate procedural time out was performed. The patient was then placed supine on the table and prepped and draped in the usual sterile fashion.   Ultrasound was placed in the sterile sleeve and the left groin was evaluated the left common femoral artery was echolucent and pulsatile indicating patency.  Image was recorded for the permanent record and under  real-time visualization a microneedle was inserted into the common femoral artery microwire followed by a micro-sheath.  A Amplatz wire was then advanced through the micro-sheath and a  5 French sheath was then inserted over the Amplatz wire.  A 5 French pigtail catheter was positioned at the level of the bifurcation and a LAO view of the pelvis was obtained.  Subsequently a pigtail catheter with the stiff angle Glidewire was used to cross the aortic bifurcation the catheter wire were advanced down into the right distal external iliac artery. Oblique view of the femoral bifurcation was then obtained and subsequently the wire was reintroduced and the pigtail catheter negotiated into the SFA representing third order catheter placement. Distal runoff was then performed.  5000 units of heparin was then given and allowed to circulate and a 7 [place] Rabi sheath was advanced up and over the bifurcation and positioned in the superficial femoral artery  The S6 Crosser catheter was then prepped on the field and a angled Usher catheter was advanced into the cul-de-sac of the posterior tibial artery under magnified imaging. Using the Crosser catheter as well as several other wires and catheters the occlusion of the posterior tibial artery was negotiated about two thirds of the way. However, proper reentry could not be achieved and therefore I turned my attentions to the peroneal which is occluded in its distal one third.  The catheter and wire were then pulled back into the distal popliteal and the wire and catheter were used to select the peroneal hand injection of contrast was then performed to map the peroneal in its entirety again confirming the occlusion  distally. The VAT wire was negotiated down to the level of the occlusion and then the Usher catheter wire was removed and the S6 Crosser advanced. Crosser did negotiate this occlusion and subsequently the V 18 wire was used. Wire was advanced down onto the dorsum of  the foot. A 2 mm x 10 millimeter balloon was then negotiated across the occlusion inflation was to 12 atm for 2 full minutes. Follow-up imaging demonstrated minimal residual stenosis with in-line flow now via the peroneal.  The sheath was then pulled into the left external iliac artery and an LAO projection of the left femoral was obtained and subsequently a Star close device deployed. There were no immediate complications.  Findings:  The common and external iliac arteries are widely patent bilaterally.  The right common femoral is widely patent as is the profunda femoris.  The SFA does not have a significant stenosis.  The popliteal is patent as is the tibioperoneal trunk. The origin of the anterior tibial is patent however this occludes proximally 3 cm distally and remains occluded throughout its entire course. The posterior tibial is patent in its first 5 cm there is a critical stenosis within that segment however, it then occludes and remains occluded throughout its course. There is no visualization of the dorsalis pedis or medial and lateral plantar vessels. The peroneal is patent in its proximal two thirds its distal one third is occluded and there is the typical upside down Y configuration noted filling the pedal vessels.  Following Crosser atherectomy of the posterior tibial we do not achieve proper reentry and therefore angioplasty or further intervention in the posterior tibial is not performed.  The Crosser is then used to cross the distal peroneal occlusion which is successful and subsequently 2 mm angioplasty is performed with less than 5% residual stenosis through the previously occluded segment. There is now a rapid flow of contrast into the collaterals. There is still poor filling of the pedal arch.   Disposition: Patient was taken to the recovery room in stable condition having tolerated the procedure well.  [name] 04/27/2016,5:11 PM

## 2016-04-27 NOTE — Progress Notes (Signed)
Patient Demographics  [name], is a 55 y.o. male   MRN: [id number]   DOB - 1961-08-03  Admit Date - 04/25/2016    Outpatient Primary MD for the patient is PROVIDER NOT [place] requested in the Hospital by Gladstone Lighter, MD, On 04/27/2016     With History of -  Past Medical History  Diagnosis Date  . Chronic kidney disease   . Hypertension   . Diabetes mellitus without complication ([place])   . Hypercholesteremia   . Atherosclerosis   . Stroke ([place])   . Anemia       Past Surgical History  Procedure Laterality Date  . Av fistula placement Left 2015  . Peripheral vascular catheterization N/A 10/03/2015    Procedure: A/V Shuntogram/Fistulagram;  Surgeon: [name], MD;  Location: [facility];  Service: Cardiovascular;  Laterality: N/A;  . Peripheral vascular catheterization N/A 01/16/2016    Procedure: Abdominal Aortogram w/Lower Extremity;  Surgeon: [name], MD;  Location: [facility];  Service: Cardiovascular;  Laterality: N/A;  . Peripheral vascular catheterization  01/16/2016    Procedure: Lower Extremity Intervention;  Surgeon: [name], MD;  Location: [facility];  Service: Cardiovascular;;  . Leg amputation below knee Left   . Wound debridement Right 04/25/2016    Procedure: DEBRIDEMENT WOUND;  Surgeon: Katha Cabal, MD;  Location: [facility];  Service: Vascular;  Laterality: Right;  . Application of wound vac Right 04/25/2016    Procedure: APPLICATION OF WOUND VAC;  Surgeon: Katha Cabal, MD;  Location: [facility];  Service: Vascular;  Laterality: Right;  . Amputation Right 04/25/2016    Procedure: TOE AMPUTATION;  Surgeon: Katha Cabal, MD;  Location: [facility];  Service: Vascular;  Laterality: Right;    in for   No chief complaint on file.    HPI  [name]   is a 55 y.o. male, And hospitalized because of gangrenous changes to his right foot. Underwent surgery by Dr. Ronalee Belts on Wednesday to remove those toes but needs more definitive procedure for closure of the wound which will likely be a transmetatarsal amputation. Patient has had a previous below-knee amputation on the left leg. Social History Social History  Substance Use Topics  . Smoking status: Never Smoker   . Smokeless tobacco: Not on file  . Alcohol Use: No    Family History Family History  Problem Relation Age of Onset  . Prostate cancer Mother   . Diabetes Father     Prior to Admission medications   Medication Sig Start Date End Date Taking? Authorizing Provider  sevelamer carbonate (RENVELA) 800 MG tablet Take 800 mg by mouth 3 (three) times daily with meals.   Yes Historical Provider, MD  aspirin 325 MG tablet Take 325 mg by mouth daily.    Historical Provider, MD  cinacalcet (SENSIPAR) 30 MG tablet Take 60 mg by mouth daily.     Historical Provider, MD  glipiZIDE (GLUCOTROL) 10 MG tablet Take 10 mg by mouth daily before breakfast.    Historical Provider, MD  midodrine (PROAMATINE) 10 MG tablet Take 10 mg by mouth 3 (three) times daily.  Historical Provider, MD    Anti-infectives    Start     Dose/Rate Route Frequency Ordered Stop   04/27/16 0000  ceFAZolin (ANCEF) IVPB 1 g/50 mL premix    Comments:  Send with pt to OR   1 g 100 mL/hr over 30 Minutes Intravenous On call 04/26/16 1307 04/28/16 0000   04/26/16 1200  vancomycin (VANCOCIN) IVPB 1000 mg/200 mL premix     1,000 mg 200 mL/hr over 60 Minutes Intravenous Every T-Th-Sa (Hemodialysis) 04/25/16 1852     04/25/16 1900  vancomycin (VANCOCIN) 2,000 mg in sodium chloride 0.9 % 500 mL IVPB     2,000 mg 250 mL/hr over 120 Minutes Intravenous  Once 04/25/16 1849 04/25/16 2215   04/25/16 1152  ceFAZolin (ANCEF) IVPB 2g/100 mL premix     2 g 200 mL/hr over 30 Minutes Intravenous On call to O.R. 04/25/16 1152 04/25/16  1223      Scheduled Meds: . aspirin EC  81 mg Oral Daily  .  ceFAZolin (ANCEF) IV  1 g Intravenous On Call  . cinacalcet  60 mg Oral Q breakfast  . heparin  5,000 Units Subcutaneous Q8H  . insulin aspart  0-5 Units Subcutaneous QHS  . insulin aspart  0-9 Units Subcutaneous TID WC  . midodrine  10 mg Oral TID PC  . senna-docusate  2 tablet Oral BID  . sevelamer carbonate  800 mg Oral TID WC  . vancomycin  1,000 mg Intravenous Q T,Th,Sa-HD   Continuous Infusions:  PRN Meds:.acetaminophen **OR** acetaminophen, HYDROcodone-acetaminophen, morphine injection, ondansetron **OR** ondansetron (ZOFRAN) IV, polyethylene glycol, sodium phosphate, sorbitol  No Known Allergies  Physical Exam  Vitals  Blood pressure 135/78, pulse 79, temperature 97.5 F (36.4 C), temperature source Oral, resp. rate 17, height 5\' 7"  (1.702 m), weight 116.5 kg (256 lb 13.4 oz), SpO2 92 %.  Lower Extremity exam:Dr. Ronalee Belts did surgery on the right foot on Tuesday or Wednesday secondary to gangrenous toes and the resolve the necrosis and infection to the region. The wound VAC spelled since that time frame and he's been on intravenous antibiotics.   Data Review  CBC  Recent Labs Lab 04/23/16 1503 04/25/16 0945 04/25/16 1513 04/26/16 0459 04/27/16 0444  WBC 10.9*  --  9.8 10.0 9.9  HGB 8.0* 8.8* 8.1* 7.5* 7.6*  HCT 25.3* 26.0* 26.3* 24.3* 24.3*  PLT 308  --  308 290 253  MCV 87.4  --  88.1 89.0 88.6  MCH 27.6  --  27.0 27.6 27.6  MCHC 31.6*  --  30.6* 31.0* 31.2*  RDW 20.8*  --  20.9* 21.8* 21.2*  LYMPHSABS 1.0  --   --   --   --   MONOABS 0.9  --   --   --   --   EOSABS 0.9*  --   --   --   --   BASOSABS 0.1  --   --   --   --    ------------------------------------------------------------------------------------------------------------------  Chemistries   Recent Labs Lab 04/23/16 1503 04/25/16 0945 04/25/16 1513 04/26/16 0459 04/27/16 0444  NA 144 139  --  139 136  K 4.2 4.1  --  4.9  4.6  CL 99*  --   --  97* 96*  CO2 33*  --   --  32 31  GLUCOSE 180* 166*  --  178* 131*  BUN 45*  --   --  49* 36*  CREATININE 6.87*  --  5.90* 7.05* 5.62*  CALCIUM 7.8*  --   --  8.3* 8.0*   --------   --------------------------------------------------------------------------------------------------------------- Imaging results:   No results found.  Assessment & Plan: Gangrene to the distal portion of the right foot including toes status post removal of those toes and debridement of necrosis and infected wound by Dr. Ronalee Belts 2 days ago. Plan: Patient needs a more definitive procedure to remove the metatarsal distal portions and flap plantar and dorsal aspects together try to get a primary closure. He is undergoing an angioplasty today by Dr. Ronalee Belts hopeful that will improve his blood flow and get a better prognosis for healing. I will put orders in for a consent form and also for her to be nothing by mouth after midnight and have him on the schedule for tomorrow morning for this procedure.  Active Problems:   Atherosclerosis of extremity with gangrene [facility])   Family Communication: Plan discussed with patient and family  [name] M.D on 04/27/2016 at 2:01 PM  Thank you for the consult, we will follow the patient with you in the Hospital.

## 2016-04-27 NOTE — Progress Notes (Signed)
Special recovery nurse was reminded for the orders that were signed and held specific for special procedure encounter.

## 2016-04-27 NOTE — Clinical Social Work Note (Signed)
Clinical Social Work Assessment  Patient Details  Name: [name] MRN: [id number] Date of Birth: 01-11-1961  Date of referral:  04/27/16               Reason for consult:  Facility Placement                Permission sought to share information with:  Facility Sport and exercise psychologist, Family Supports Permission granted to share information::  Yes, Verbal Permission Granted  Name::        Agency::     Relationship::     Contact Information:     Housing/Transportation Living arrangements for the past 2 months:  [place] of Information:  Patient Patient Interpreter Needed:  Spanish Criminal Activity/Legal Involvement Pertinent to Current Situation/Hospitalization:  No - Comment as needed Significant Relationships:  Spouse Lives with:  Spouse Do you feel safe going back to the place where you live?  Yes Need for family participation in patient care:  No (Coment)  Care giving concerns:  Patient will require a wound vac management at discharge.   Social Worker assessment / plan:  CSW informed that Dr. Delana Meyer has requested placement for patient for wound vac management. CSW utilized the hospital interpreter and spoke with patient this morning regarding rehab facility placement for wound vac management. Patient is agreeable to this and states he will be able to explain this to his wife. Patient verbalized understanding that he understands that he will have to remain in the rehab facility day and night.   Employment status:  Disabled (Comment on whether or not currently receiving Disability) Insurance information:  Medicare PT Recommendations:  [place] / Referral to community resources:  [place]  Patient/Family's Response to care:  Patient expressed appreciation for CSW assistance.  Patient/Family's Understanding of and Emotional Response to Diagnosis, Current Treatment, and Prognosis:  Patient states he understands  the recommendations and is agreeable to them. Bedsearch initiated.  Emotional Assessment Appearance:  Appears older than stated age Attitude/Demeanor/Rapport:   (pleasant and cooperative) Affect (typically observed):  Accepting Orientation:  Oriented to Self, Oriented to Place, Oriented to  Time, Oriented to Situation Alcohol / Substance use:    Psych involvement (Current and /or in the community):  No (Comment)  Discharge Needs  Concerns to be addressed:  Care Coordination Readmission within the last 30 days:  No Current discharge risk:  None Barriers to Discharge:  No Barriers Identified   Shela Leff, LCSW 04/27/2016, 10:03 AM

## 2016-04-27 NOTE — NC FL2 (Signed)
[place] LEVEL OF CARE SCREENING TOOL     IDENTIFICATION  Patient Name: [name] Birthdate: 06/10/1961 Sex: male Admission Date (Current Location): 04/25/2016  [place] and [place] Number:  Engineering geologist and Address:  [facility], [address], [place], [place] [address]      Provider Number: [id number]  Attending Physician Name and Address:  Gladstone Lighter, MD  Relative Name and Phone Number:       Current Level of Care: Hospital Recommended Level of Care: [place] Prior Approval Number:    Date Approved/Denied:   PASRR Number: [id number]  Discharge Plan: SNF    Current Diagnoses: Patient Active Problem List   Diagnosis Date Noted  . Atherosclerosis of extremity with gangrene ([place]) 04/25/2016    Orientation RESPIRATION BLADDER Height & Weight     Self, Time, Situation, Place  Normal Continent Weight: 256 lb 13.4 oz (116.5 kg) Height:  5\' 7"  (170.2 cm)  BEHAVIORAL SYMPTOMS/MOOD NEUROLOGICAL BOWEL NUTRITION STATUS   (none)  (none) Continent Diet  AMBULATORY STATUS COMMUNICATION OF NEEDS Skin   Limited Assist Verbally (spanish speaking) Surgical wounds, Wound Vac                       Personal Care Assistance Level of Assistance   (independent in adl's except ambulation) Bathing Assistance: Independent         Functional Limitations Info   (no limitations)          SPECIAL CARE FACTORS FREQUENCY  PT (By licensed PT)                    Contractures Contractures Info: Not present    Additional Factors Info  Allergies, Code Status Code Status Info: full Allergies Info: nka           Current Medications (04/27/2016):  This is the current hospital active medication list Current Facility-Administered Medications  Medication Dose Route Frequency Provider Last Rate Last Dose  . acetaminophen (TYLENOL) tablet 650 mg  650 mg Oral Q6H PRN Katha Cabal, MD       Or   . acetaminophen (TYLENOL) suppository 650 mg  650 mg Rectal Q6H PRN Katha Cabal, MD      . aspirin EC tablet 81 mg  81 mg Oral Daily Katha Cabal, MD   81 mg at 04/26/16 1602  . ceFAZolin (ANCEF) IVPB 1 g/50 mL premix  1 g Intravenous On Call [employer], PA-C      . cinacalcet (SENSIPAR) tablet 60 mg  60 mg Oral Q breakfast Katha Cabal, MD   60 mg at 04/26/16 0834  . heparin injection 5,000 Units  5,000 Units Subcutaneous Q8H Katha Cabal, MD   5,000 Units at 04/27/16 [phone]  . HYDROcodone-acetaminophen (NORCO/VICODIN) 5-325 MG per tablet 1-2 tablet  1-2 tablet Oral Q6H PRN Katha Cabal, MD      . insulin aspart (novoLOG) injection 0-5 Units  0-5 Units Subcutaneous QHS Loletha Grayer, MD   0 Units at 04/25/16 2242  . insulin aspart (novoLOG) injection 0-9 Units  0-9 Units Subcutaneous TID WC Loletha Grayer, MD   1 Units at 04/26/16 1715  . midodrine (PROAMATINE) tablet 10 mg  10 mg Oral TID PC Katha Cabal, MD   10 mg at 04/26/16 0834  . morphine 2 MG/ML injection 2 mg  2 mg Intravenous Q1H PRN Katha Cabal, MD      .  ondansetron (ZOFRAN) tablet 4 mg  4 mg Oral Q6H PRN Katha Cabal, MD       Or  . ondansetron [facility]) injection 4 mg  4 mg Intravenous Q6H PRN Katha Cabal, MD      . polyethylene glycol (MIRALAX / GLYCOLAX) packet 17 g  17 g Oral Daily PRN Gladstone Lighter, MD      . senna-docusate (Senokot-S) tablet 2 tablet  2 tablet Oral BID Gladstone Lighter, MD   2 tablet at 04/26/16 2214  . sevelamer carbonate (RENVELA) tablet 800 mg  800 mg Oral TID WC Katha Cabal, MD   800 mg at 04/26/16 1715  . sodium phosphate (FLEET) 7-19 GM/118ML enema 1 enema  1 enema Rectal Once PRN Katha Cabal, MD      . sorbitol 70 % solution 30 mL  30 mL Oral Daily PRN Katha Cabal, MD      . vancomycin (VANCOCIN) IVPB 1000 mg/200 mL premix  1,000 mg Intravenous Q T,Th,Sa-HD Jody C Barefoot, RPH   1,000 mg at 04/26/16 1425     Discharge  Medications: Please see discharge summary for a list of discharge medications.  Relevant Imaging Results:  Relevant Lab Results:   Additional Information SS: [id number]  Shela Leff, LCSW

## 2016-04-27 NOTE — Progress Notes (Signed)
offered laxative and stool softeners but patient declined due to patient is going to procedure.(angiogram). Will attempt tonight before MN.

## 2016-04-28 ENCOUNTER — Encounter
Admission: RE | Disposition: A | Discharge status: Skilled Nursing Facility | Payer: Self-pay | Source: Ambulatory Visit | Attending: Internal Medicine

## 2016-04-28 ENCOUNTER — Inpatient Hospital Stay: Payer: Medicare Other

## 2016-04-28 ENCOUNTER — Inpatient Hospital Stay: Payer: Medicare Other | Admitting: Anesthesiology

## 2016-04-28 ENCOUNTER — Encounter: Payer: Self-pay | Admitting: Podiatry

## 2016-04-28 ENCOUNTER — Inpatient Hospital Stay (HOSPITAL_COMMUNITY)
Admission: RE | Admit: 2016-04-28 | Discharge: 2016-04-28 | Disposition: A | Discharge status: Home / Self Care | Payer: Medicare Other | Source: Ambulatory Visit | Attending: Cardiovascular Disease | Admitting: Cardiovascular Disease

## 2016-04-28 DIAGNOSIS — R9431 Abnormal electrocardiogram [ECG] [EKG]: Secondary | ICD-10-CM

## 2016-04-28 DIAGNOSIS — I469 Cardiac arrest, cause unspecified: Secondary | ICD-10-CM | POA: Diagnosis present

## 2016-04-28 HISTORY — PX: TRANSMETATARSAL AMPUTATION: SHX6197

## 2016-04-28 LAB — MAGNESIUM: Magnesium: 2.4 mg/dL (ref 1.7–2.4)

## 2016-04-28 LAB — CBC
HEMATOCRIT: 25.2 % — AB (ref 40.0–52.0)
HEMATOCRIT: 23.9 % — AB (ref 40.0–52.0)
HEMOGLOBIN: 7.3 g/dL — AB (ref 13.0–18.0)
Hemoglobin: 7.9 g/dL — ABNORMAL LOW (ref 13.0–18.0)
MCH: 27.5 pg (ref 26.0–34.0)
MCH: 26.8 pg (ref 26.0–34.0)
MCHC: 31.2 g/dL — ABNORMAL LOW (ref 32.0–36.0)
MCHC: 30.7 g/dL — AB (ref 32.0–36.0)
MCV: 87.9 fL (ref 80.0–100.0)
MCV: 87.3 fL (ref 80.0–100.0)
Platelets: 279 10*3/uL (ref 150–440)
Platelets: 300 10*3/uL (ref 150–440)
RBC: 2.87 MIL/uL — AB (ref 4.40–5.90)
RBC: 2.74 MIL/uL — AB (ref 4.40–5.90)
RDW: 21.7 % — ABNORMAL HIGH (ref 11.5–14.5)
RDW: 22.5 % — ABNORMAL HIGH (ref 11.5–14.5)
WBC: 9.8 10*3/uL (ref 3.8–10.6)
WBC: 15.5 10*3/uL — ABNORMAL HIGH (ref 3.8–10.6)

## 2016-04-28 LAB — BLOOD GAS, ARTERIAL
ACID-BASE EXCESS: 2.9 mmol/L (ref 0.0–3.0)
BICARBONATE: 28.4 meq/L — AB (ref 21.0–28.0)
FIO2: 0.5
LHR: 15 {breaths}/min
O2 Saturation: 96.4 %
PEEP/CPAP: 5 cmH2O
PH ART: 7.38 (ref 7.350–7.450)
Patient temperature: 37
VT: 500 mL
pCO2 arterial: 48 mmHg (ref 32.0–48.0)
pO2, Arterial: 87 mmHg (ref 83.0–108.0)

## 2016-04-28 LAB — COMPREHENSIVE METABOLIC PANEL
ALBUMIN: 3.1 g/dL — AB (ref 3.5–5.0)
ALK PHOS: 245 U/L — AB (ref 38–126)
ALT: 8 U/L — AB (ref 17–63)
ANION GAP: 10 (ref 5–15)
AST: 23 U/L (ref 15–41)
BILIRUBIN TOTAL: 0.5 mg/dL (ref 0.3–1.2)
BUN: 57 mg/dL — ABNORMAL HIGH (ref 6–20)
CALCIUM: 8 mg/dL — AB (ref 8.9–10.3)
CO2: 29 mmol/L (ref 22–32)
CREATININE: 7.59 mg/dL — AB (ref 0.61–1.24)
Chloride: 99 mmol/L — ABNORMAL LOW (ref 101–111)
GFR calc non Af Amer: 7 mL/min — ABNORMAL LOW (ref >60)
GFR, EST AFRICAN AMERICAN: 8 mL/min — AB (ref >60)
GLUCOSE: 183 mg/dL — AB (ref 65–99)
Potassium: 5.2 mmol/L — ABNORMAL HIGH (ref 3.5–5.1)
SODIUM: 138 mmol/L (ref 135–145)
TOTAL PROTEIN: 6.2 g/dL — AB (ref 6.5–8.1)

## 2016-04-28 LAB — BASIC METABOLIC PANEL
ANION GAP: 11 (ref 5–15)
BUN: 52 mg/dL — ABNORMAL HIGH (ref 6–20)
CALCIUM: 8.5 mg/dL — AB (ref 8.9–10.3)
CO2: 30 mmol/L (ref 22–32)
CREATININE: 7.16 mg/dL — AB (ref 0.61–1.24)
Chloride: 99 mmol/L — ABNORMAL LOW (ref 101–111)
GFR, EST AFRICAN AMERICAN: 9 mL/min — AB (ref >60)
GFR, EST NON AFRICAN AMERICAN: 8 mL/min — AB (ref >60)
Glucose, Bld: 162 mg/dL — ABNORMAL HIGH (ref 65–99)
Potassium: 5 mmol/L (ref 3.5–5.1)
SODIUM: 140 mmol/L (ref 135–145)

## 2016-04-28 LAB — PREPARE RBC (CROSSMATCH)

## 2016-04-28 LAB — LACTIC ACID, PLASMA: Lactic Acid, Venous: 0.9 mmol/L (ref 0.5–1.9)

## 2016-04-28 LAB — GLUCOSE, CAPILLARY
GLUCOSE-CAPILLARY: 135 mg/dL — AB (ref 65–99)
GLUCOSE-CAPILLARY: 194 mg/dL — AB (ref 65–99)
GLUCOSE-CAPILLARY: 125 mg/dL — AB (ref 65–99)
Glucose-Capillary: 89 mg/dL (ref 65–99)

## 2016-04-28 LAB — HEPARIN LEVEL (UNFRACTIONATED): HEPARIN UNFRACTIONATED: 0.1 [IU]/mL — AB (ref 0.30–0.70)

## 2016-04-28 LAB — PHOSPHORUS: Phosphorus: 6.4 mg/dL — ABNORMAL HIGH (ref 2.5–4.6)

## 2016-04-28 LAB — TROPONIN I
TROPONIN I: 0.03 ng/mL — AB (ref <0.03)
Troponin I: 0.07 ng/mL (ref <0.03)

## 2016-04-28 LAB — PROCALCITONIN: Procalcitonin: 1.8 ng/mL

## 2016-04-28 LAB — MRSA PCR SCREENING: MRSA by PCR: NEGATIVE

## 2016-04-28 SURGERY — AMPUTATION, FOOT, TRANSMETATARSAL
Anesthesia: General | Laterality: Right

## 2016-04-28 MED ORDER — ANTISEPTIC ORAL RINSE SOLUTION (CORINZ)
7.0000 mL | OROMUCOSAL | Status: DC
  Administered 2016-04-28 – 2016-04-29 (×6): 7 mL via OROMUCOSAL
  Filled 2016-04-28 (×15): qty 7

## 2016-04-28 MED ORDER — MIDAZOLAM HCL 2 MG/2ML IJ SOLN
4.0000 mg | Freq: Once | INTRAMUSCULAR | Status: AC
  Administered 2016-04-28: 4 mg via INTRAVENOUS

## 2016-04-28 MED ORDER — ONDANSETRON HCL 4 MG/2ML IJ SOLN: 4.0000 mg | Freq: Four times a day (QID) | INTRAMUSCULAR | Status: DC | PRN

## 2016-04-28 MED ORDER — SENNOSIDES 8.8 MG/5ML PO SYRP
5.0000 mL | ORAL_SOLUTION | Freq: Two times a day (BID) | ORAL | Status: DC | PRN
  Filled 2016-04-28: qty 5

## 2016-04-28 MED ORDER — EPHEDRINE SULFATE 50 MG/ML IJ SOLN
INTRAMUSCULAR | Status: DC | PRN
  Administered 2016-04-28: 20 mg via INTRAVENOUS
  Administered 2016-04-28: 30 mg via INTRAVENOUS

## 2016-04-28 MED ORDER — ROCURONIUM BROMIDE 100 MG/10ML IV SOLN
INTRAVENOUS | Status: DC | PRN
  Administered 2016-04-28: 30 mg via INTRAVENOUS

## 2016-04-28 MED ORDER — FENTANYL BOLUS VIA INFUSION
50.0000 ug | INTRAVENOUS | Status: DC | PRN
  Administered 2016-04-29: 50 ug via INTRAVENOUS
  Filled 2016-04-28: qty 50

## 2016-04-28 MED ORDER — GLYCOPYRROLATE 0.2 MG/ML IJ SOLN
INTRAMUSCULAR | Status: DC | PRN
  Administered 2016-04-28: 0.2 mg via INTRAVENOUS

## 2016-04-28 MED ORDER — HEPARIN (PORCINE) IN NACL 100-0.45 UNIT/ML-% IJ SOLN
1950.0000 [IU]/h | INTRAMUSCULAR | Status: DC
  Administered 2016-04-28: 1200 [IU]/h via INTRAVENOUS
  Administered 2016-04-29: 1750 [IU]/h via INTRAVENOUS
  Filled 2016-04-28 (×6): qty 250

## 2016-04-28 MED ORDER — ACETAMINOPHEN 325 MG PO TABS: 650.0000 mg | ORAL_TABLET | ORAL | Status: DC | PRN

## 2016-04-28 MED ORDER — CETYLPYRIDINIUM CHLORIDE 0.05 % MT LIQD
7.0000 mL | Freq: Two times a day (BID) | OROMUCOSAL | Status: DC
  Administered 2016-04-28: 7 mL via OROMUCOSAL

## 2016-04-28 MED ORDER — SODIUM CHLORIDE 0.9 % IV SOLN
INTRAVENOUS | Status: DC | PRN
  Administered 2016-04-28: 08:00:00 via INTRAVENOUS

## 2016-04-28 MED ORDER — FENTANYL CITRATE (PF) 100 MCG/2ML IJ SOLN
100.0000 ug | Freq: Once | INTRAMUSCULAR | Status: AC
  Administered 2016-04-28: 100 ug via INTRAVENOUS

## 2016-04-28 MED ORDER — BUPIVACAINE HCL (PF) 0.5 % IJ SOLN
INTRAMUSCULAR | Status: DC | PRN
  Administered 2016-04-28: 15 mL

## 2016-04-28 MED ORDER — FAMOTIDINE IN NACL 20-0.9 MG/50ML-% IV SOLN: 20.0000 mg | Freq: Two times a day (BID) | INTRAVENOUS | Status: DC

## 2016-04-28 MED ORDER — VECURONIUM BROMIDE 10 MG IV SOLR
10.0000 mg | Freq: Once | INTRAVENOUS | Status: AC
  Administered 2016-04-28: 10 mg via INTRAVENOUS

## 2016-04-28 MED ORDER — SODIUM CHLORIDE 0.9% FLUSH
3.0000 mL | INTRAVENOUS | Status: DC | PRN
  Administered 2016-04-28: 3 mL via INTRAVENOUS
  Filled 2016-04-28: qty 3

## 2016-04-28 MED ORDER — PROPOFOL 10 MG/ML IV BOLUS
INTRAVENOUS | Status: DC | PRN
Start: 2016-04-28 — End: 2016-04-28
  Administered 2016-04-28: 40 mg via INTRAVENOUS

## 2016-04-28 MED ORDER — SODIUM CHLORIDE 0.9 % IV SOLN
Freq: Once | INTRAVENOUS | Status: AC
  Administered 2016-04-28: 10 mL/h via INTRAVENOUS

## 2016-04-28 MED ORDER — FAMOTIDINE IN NACL 20-0.9 MG/50ML-% IV SOLN
20.0000 mg | Freq: Two times a day (BID) | INTRAVENOUS | Status: DC
  Administered 2016-04-28 – 2016-04-29 (×3): 20 mg via INTRAVENOUS
  Filled 2016-04-28 (×4): qty 50

## 2016-04-28 MED ORDER — MIDAZOLAM HCL 2 MG/2ML IJ SOLN: 2.0000 mg | INTRAMUSCULAR | Status: DC | PRN

## 2016-04-28 MED ORDER — METOPROLOL TARTRATE 25 MG PO TABS
25.0000 mg | ORAL_TABLET | Freq: Two times a day (BID) | ORAL | Status: DC
  Administered 2016-04-28 – 2016-05-02 (×7): 25 mg via ORAL
  Filled 2016-04-28 (×7): qty 1

## 2016-04-28 MED ORDER — ONDANSETRON HCL 4 MG/2ML IJ SOLN
INTRAMUSCULAR | Status: DC | PRN
  Administered 2016-04-28: 4 mg via INTRAVENOUS

## 2016-04-28 MED ORDER — CHLORHEXIDINE GLUCONATE 0.12% ORAL RINSE (MEDLINE KIT)
15.0000 mL | Freq: Two times a day (BID) | OROMUCOSAL | Status: DC
  Administered 2016-04-28 – 2016-04-29 (×2): 15 mL via OROMUCOSAL
  Filled 2016-04-28 (×3): qty 15

## 2016-04-28 MED ORDER — FENTANYL CITRATE (PF) 100 MCG/2ML IJ SOLN: 25.0000 ug | INTRAMUSCULAR | Status: DC | PRN

## 2016-04-28 MED ORDER — MIDAZOLAM HCL 2 MG/2ML IJ SOLN
INTRAMUSCULAR | Status: AC
  Administered 2016-04-28: 4 mg via INTRAVENOUS
  Filled 2016-04-28: qty 4

## 2016-04-28 MED ORDER — HEPARIN SODIUM (PORCINE) 5000 UNIT/ML IJ SOLN: 5000.0000 [IU] | Freq: Three times a day (TID) | INTRAMUSCULAR | Status: DC

## 2016-04-28 MED ORDER — SODIUM CHLORIDE 0.9 % IV SOLN: 250.0000 mL | INTRAVENOUS | Status: DC | PRN

## 2016-04-28 MED ORDER — PHENYLEPHRINE HCL 10 MG/ML IJ SOLN
INTRAMUSCULAR | Status: DC | PRN
  Administered 2016-04-28 (×3): 300 ug via INTRAVENOUS
  Administered 2016-04-28: 200 ug via INTRAVENOUS

## 2016-04-28 MED ORDER — CHLORHEXIDINE GLUCONATE 0.12 % MT SOLN
15.0000 mL | Freq: Two times a day (BID) | OROMUCOSAL | Status: DC
  Administered 2016-04-28: 15 mL via OROMUCOSAL
  Filled 2016-04-28: qty 15

## 2016-04-28 MED ORDER — FENTANYL 2500MCG IN NS 250ML (10MCG/ML) PREMIX INFUSION
25.0000 ug/h | INTRAVENOUS | Status: DC
  Administered 2016-04-28: 25 ug/h via INTRAVENOUS
  Administered 2016-04-28: 75 ug/h via INTRAVENOUS
  Filled 2016-04-28: qty 250

## 2016-04-28 MED ORDER — ATORVASTATIN CALCIUM 20 MG PO TABS
40.0000 mg | ORAL_TABLET | Freq: Every day | ORAL | Status: DC
  Administered 2016-04-28 – 2016-05-02 (×5): 40 mg via ORAL
  Filled 2016-04-28 (×5): qty 2

## 2016-04-28 MED ORDER — HEPARIN BOLUS VIA INFUSION
4000.0000 [IU] | Freq: Once | INTRAVENOUS | Status: AC
  Administered 2016-04-28: 4000 [IU] via INTRAVENOUS
  Filled 2016-04-28: qty 4000

## 2016-04-28 MED ORDER — PIPERACILLIN-TAZOBACTAM 3.375 G IVPB 30 MIN
3.3750 g | Freq: Once | INTRAVENOUS | Status: AC
  Administered 2016-04-28: 3.375 g via INTRAVENOUS

## 2016-04-28 MED ORDER — BUPIVACAINE HCL (PF) 0.5 % IJ SOLN
INTRAMUSCULAR | Status: AC
  Filled 2016-04-28: qty 30

## 2016-04-28 MED ORDER — MIDAZOLAM HCL 2 MG/2ML IJ SOLN
INTRAMUSCULAR | Status: DC | PRN
  Administered 2016-04-28: 3 mg via INTRAVENOUS
  Administered 2016-04-28: 2 mg via INTRAVENOUS

## 2016-04-28 MED ORDER — EPINEPHRINE HCL 1 MG/ML IJ SOLN
INTRAMUSCULAR | Status: DC | PRN
  Administered 2016-04-28: 1 mg via INTRAVENOUS

## 2016-04-28 MED ORDER — FENTANYL CITRATE (PF) 100 MCG/2ML IJ SOLN: 50.0000 ug | Freq: Once | INTRAMUSCULAR | Status: AC

## 2016-04-28 MED ORDER — SODIUM CHLORIDE 0.9% FLUSH
3.0000 mL | Freq: Two times a day (BID) | INTRAVENOUS | Status: DC
  Administered 2016-04-28 – 2016-05-02 (×5): 3 mL via INTRAVENOUS

## 2016-04-28 MED ORDER — PROPOFOL 500 MG/50ML IV EMUL
INTRAVENOUS | Status: DC | PRN
  Administered 2016-04-28: 09:00:00 via INTRAVENOUS
  Administered 2016-04-28: 75 ug/kg/min via INTRAVENOUS

## 2016-04-28 MED ORDER — PROMETHAZINE HCL 25 MG/ML IJ SOLN: 6.2500 mg | INTRAMUSCULAR | Status: DC | PRN

## 2016-04-28 MED ORDER — ESMOLOL HCL 100 MG/10ML IV SOLN
INTRAVENOUS | Status: DC | PRN
  Administered 2016-04-28 (×2): 40 mg via INTRAVENOUS

## 2016-04-28 MED ORDER — LIDOCAINE HCL (PF) 1 % IJ SOLN
INTRAMUSCULAR | Status: AC
  Filled 2016-04-28: qty 30

## 2016-04-28 MED ORDER — FENTANYL CITRATE (PF) 100 MCG/2ML IJ SOLN
INTRAMUSCULAR | Status: DC | PRN
  Administered 2016-04-28 (×3): 25 ug via INTRAVENOUS

## 2016-04-28 MED ORDER — SEVELAMER CARBONATE 0.8 G PO PACK
0.8000 g | PACK | Freq: Three times a day (TID) | ORAL | Status: DC
Start: 2016-04-28 — End: 2016-05-02
  Administered 2016-04-29 – 2016-05-02 (×8): 0.8 g via ORAL
  Filled 2016-04-28 (×10): qty 1

## 2016-04-28 MED ORDER — FENTANYL CITRATE (PF) 100 MCG/2ML IJ SOLN
INTRAMUSCULAR | Status: AC
  Administered 2016-04-28: 100 ug via INTRAVENOUS
  Filled 2016-04-28: qty 2

## 2016-04-28 SURGICAL SUPPLY — 37 items
BAG COUNTER SPONGE EZ (MISCELLANEOUS) IMPLANT
BANDAGE ELASTIC 4 LF NS (GAUZE/BANDAGES/DRESSINGS) ×3 IMPLANT
BLADE MED AGGRESSIVE (BLADE) ×3 IMPLANT
BNDG COHESIVE 4X5 TAN STRL (GAUZE/BANDAGES/DRESSINGS) ×3 IMPLANT
BNDG COHESIVE 4X5 WHT NS (GAUZE/BANDAGES/DRESSINGS) ×3 IMPLANT
BNDG ESMARK 4X12 TAN STRL LF (GAUZE/BANDAGES/DRESSINGS) ×3 IMPLANT
BNDG GAUZE 4.5X4.1 6PLY STRL (MISCELLANEOUS) ×3 IMPLANT
CANISTER SUCT 1200ML W/VALVE (MISCELLANEOUS) ×6 IMPLANT
COUNTER SPONGE BAG EZ (MISCELLANEOUS)
CUFF TOURN 18 STER (MISCELLANEOUS) IMPLANT
CUFF TOURN 24 STER (MISCELLANEOUS) ×3 IMPLANT
CUFF TOURN DUAL PL 12 NO SLV (MISCELLANEOUS) IMPLANT
DRAIN PENROSE 1/4X12 LTX (DRAIN) IMPLANT
DURAPREP 26ML APPLICATOR (WOUND CARE) ×3 IMPLANT
ELECT REM PT RETURN 9FT ADLT (ELECTROSURGICAL) ×3
ELECTRODE REM PT RTRN 9FT ADLT (ELECTROSURGICAL) ×1 IMPLANT
GAUZE PETRO XEROFOAM 1X8 (MISCELLANEOUS) ×3 IMPLANT
GAUZE SPONGE 4X4 12PLY STRL (GAUZE/BANDAGES/DRESSINGS) ×3 IMPLANT
GLOVE BIO SURGEON STRL SZ8 (GLOVE) ×15 IMPLANT
GLOVE INDICATOR 7.5 STRL GRN (GLOVE) ×3 IMPLANT
GOWN STRL REUS W/ TWL LRG LVL3 (GOWN DISPOSABLE) ×2 IMPLANT
GOWN STRL REUS W/TWL LRG LVL3 (GOWN DISPOSABLE) ×4
HANDLE YANKAUER SUCT BULB TIP (MISCELLANEOUS) IMPLANT
HANDPIECE VERSAJET DEBRIDEMENT (MISCELLANEOUS) ×3 IMPLANT
IV NS 1000ML (IV SOLUTION) ×2
IV NS 1000ML BAXH (IV SOLUTION) ×1 IMPLANT
KIT RM TURNOVER STRD PROC AR (KITS) ×3 IMPLANT
LABEL OR SOLS (LABEL) ×3 IMPLANT
NDL SAFETY 18GX1.5 (NEEDLE) ×3 IMPLANT
NS IRRIG 500ML POUR BTL (IV SOLUTION) ×3 IMPLANT
PACK EXTREMITY ARMC (MISCELLANEOUS) ×3 IMPLANT
PAD ABD DERMACEA PRESS 5X9 (GAUZE/BANDAGES/DRESSINGS) ×3 IMPLANT
PENCIL ELECTRO HAND CTR (MISCELLANEOUS) IMPLANT
RASP SM TEAR CROSS CUT (RASP) ×3 IMPLANT
SPONGE LAP 18X18 5 PK (GAUZE/BANDAGES/DRESSINGS) ×3 IMPLANT
STOCKINETTE M/LG 89821 (MISCELLANEOUS) ×3 IMPLANT
SYRINGE 10CC LL (SYRINGE) ×3 IMPLANT

## 2016-04-28 NOTE — Progress Notes (Signed)
Initial Nutrition Assessment     INTERVENTION:  -If unable to wean from vent within 24-48 hr recommend starting enteral nutrition   NUTRITION DIAGNOSIS:   Inadequate oral intake related to acute illness as evidenced by NPO status.    GOAL:   Provide needs based on ASPEN/SCCM guidelines    MONITOR:   Vent status, Labs  REASON FOR ASSESSMENT:   Consult    ASSESSMENT:      Pt s/p debridement of right foot necrotic toes on 7/12, taken back to OR today for transmetatarsal amputation, now intubated. Pt with left BKA, ESRD on HD prior to admission  Past Medical History  Diagnosis Date  . Chronic kidney disease   . Hypertension   . Diabetes mellitus without complication ([place])   . Hypercholesteremia   . Atherosclerosis   . Stroke ([place])   . Anemia    Noted during admission eating 50-100% of documented meals  Medications reviewed: sensipar, aspart, senokot, renvela Labs reviewed:  Diet Order:  Diet NPO time specified  Skin:   (Right foot incision)  Last BM:  7/14  Height:   Ht Readings from Last 1 Encounters:  04/25/16 5\' 7"  (1.702 m)    Weight:   Wt Readings from Last 1 Encounters:  04/28/16 253 lb 9.6 oz (115.032 kg)    Ideal Body Weight:     BMI:  Body mass index is 39.71 kg/(m^2).  Estimated Nutritional Needs:   Kcal:  1265-1610 kcals/d (using 115kg ABW and 11-14 kcals/kg per ASPEN guidelines)  Protein:  134-168 g/d (Using 67kg IBW)  Fluid:  1038ml + UOP  EDUCATION NEEDS:   No education needs identified at this time  Jaliya Siegmann B. Zenia Resides, [place], [place] (pager) Weekend/On-Call pager [phone])

## 2016-04-28 NOTE — Progress Notes (Signed)
POST DIALYSIS ASSESSMENT 

## 2016-04-28 NOTE — Consult Note (Signed)
CARDIOLOGY CONSULT NOTE  Patient ID: [name] MRN: [id number] DOB/AGE: 57-Jul-1962 57 y.o.  Admit date: 04/25/2016 Referring Physician : Dr. [name] Fries Primary Cardiologist : New Reason for Consultation : Cardiac arrest during an amputation surgery this morning.  HPI: This is a 55 year old male with past medical history of end-stage renal disease on hemodialysis since 2008, hypertension, diabetes mellitus, hyperlipidemia, peripheral arterial disease with previous left BKA, stroke, anemia of chronic kidney disease and labile hypertension. The patient has no reported cardiac history. He presented electively on July 12 for amputation of the right second, third and fourth toes for gangrene. He had an angiogram done yesterday with angioplasty of the peroneal artery. He was alarmed this morning for amputation and at the end of the surgery, the patient developed polymorphic ventricular tachycardia which required one shock to restore sinus rhythm. CPR was performed for 2 minutes. The patient is currently sedated and intubated. I could not obtain any history from him. We did an EKG which showed sinus rhythm with borderline ST depression in the inferior leads but no significant ST elevation. His labs showed borderline troponin at 0.03. No evidence of arrhythmia since his event. The patient is noted to have twitching in his eyes of unclear etiology.    review of system could not be obtained as the patient is intubated and sedated.   Past Medical History  Diagnosis Date  . Chronic kidney disease   . Hypertension   . Diabetes mellitus without complication ([place])   . Hypercholesteremia   . Atherosclerosis   . Stroke ([place])   . Anemia     Family History  Problem Relation Age of Onset  . Prostate cancer Mother   . Diabetes Father     Social History   Social History  . Marital Status: Married    Spouse Name: N/A  . Number of Children: N/A  . Years of Education: N/A   Occupational History  .  Not on file.   Social History Main Topics  . Smoking status: Never Smoker   . Smokeless tobacco: Not on file  . Alcohol Use: No  . Drug Use: No  . Sexual Activity: Not on file   Other Topics Concern  . Not on file   Social History Narrative    Past Surgical History  Procedure Laterality Date  . Av fistula placement Left 2015  . Peripheral vascular catheterization N/A 10/03/2015    Procedure: A/V Shuntogram/Fistulagram;  Surgeon: [name], MD;  Location: [place] [facility];  Service: Cardiovascular;  Laterality: N/A;  . Peripheral vascular catheterization N/A 01/16/2016    Procedure: Abdominal Aortogram w/Lower Extremity;  Surgeon: [name], MD;  Location: [place] [facility];  Service: Cardiovascular;  Laterality: N/A;  . Peripheral vascular catheterization  01/16/2016    Procedure: Lower Extremity Intervention;  Surgeon: [name], MD;  Location: [place] [facility];  Service: Cardiovascular;;  . Leg amputation below knee Left   . Wound debridement Right 04/25/2016    Procedure: DEBRIDEMENT WOUND;  Surgeon: Katha Cabal, MD;  Location: [facility];  Service: Vascular;  Laterality: Right;  . Application of wound vac Right 04/25/2016    Procedure: APPLICATION OF WOUND VAC;  Surgeon: Katha Cabal, MD;  Location: [facility];  Service: Vascular;  Laterality: Right;  . Amputation Right 04/25/2016    Procedure: TOE AMPUTATION;  Surgeon: Katha Cabal, MD;  Location: [facility];  Service: Vascular;  Laterality: Right;     Prescriptions  prior to admission  Medication Sig Dispense Refill Last Dose  . sevelamer carbonate (RENVELA) 800 MG tablet Take 800 mg by mouth 3 (three) times daily with meals.   04/24/2016 at Unknown time  . aspirin 325 MG tablet Take 325 mg by mouth daily.   04/23/2016  . cinacalcet (SENSIPAR) 30 MG tablet Take 60 mg by mouth daily.    04/23/2016  . glipiZIDE (GLUCOTROL) 10 MG tablet Take 10 mg by mouth daily before breakfast.   04/23/2016  . midodrine  (PROAMATINE) 10 MG tablet Take 10 mg by mouth 3 (three) times daily.   04/24/2016    Physical Exam: Blood pressure 136/80, pulse 99, temperature 97.5 F (36.4 C), temperature source Axillary, resp. rate 15, height 5\' 7"  (1.702 m), weight 253 lb 9.6 oz (115.032 kg), SpO2 100 %.   Constitutional:  The patient is intubated and sedated. HENT: No nasal discharge.  Head: Normocephalic and atraumatic.  Eyes: Pupils are equal and round.  No discharge. Neck: Normal range of motion. Neck supple. No JVD present. No thyromegaly present.  Cardiovascular: Mildly tachycardic, regular rhythm, normal heart sounds. Exam reveals no gallop and no friction rub. No murmur heard.  Pulmonary/Chest: Effort normal and breath sounds normal. No stridor. Intubated. no wheezes or rales.   Abdominal: Soft. Bowel sounds are normal. He exhibits no distension. There is no tenderness. There is no rebound and no guarding.  Musculoskeletal: Left below the knee amputation. The right foot is wrapped.  Neurological: not able to assess.  Skin: Skin is warm and dry. No rash noted. He is not diaphoretic. No erythema. No pallor.  Psychiatric: Normal mood and affect.  behavior is normal. Judgment and thought content normal.     Labs:   Lab Results  Component Value Date   WBC 15.5* 04/28/2016   HGB 7.3* 04/28/2016   HCT 23.9* 04/28/2016   MCV 87.3 04/28/2016   PLT 300 04/28/2016    Recent Labs Lab 04/28/16 1111  NA 138  K 5.2*  CL 99*  CO2 29  BUN 57*  CREATININE 7.59*  CALCIUM 8.0*  PROT 6.2*  BILITOT 0.5  ALKPHOS 245*  ALT 8*  AST 23  GLUCOSE 183*   Lab Results  Component Value Date   TROPONINI 0.03* 04/28/2016      Radiology: Chest x-ray reviewed and showed no acute osseous. EKG: Personally reviewed by me and showed sinus tachycardia with minor inferior ST depression with no ST elevation. Rhythm strips during surgery were reviewed and showed polymorphic ventricular tachycardia treated successfully with  one shock.  ASSESSMENT AND PLAN:     1. Status post cardiac arrest due to polymorphic ventricular tachycardia during surgery: Typically, the most likely etiology is an ischemic cardiac event especially with his underlying atherosclerosis and multiple risk factors for coronary artery disease. Current EKG does not show any ST elevation or major ischemia. The patient is having some twitching in his eyes and there is a concern about some neurologic event. Thus, I agree with CT scan of the head. If CT scan is unremarkable, I recommend starting unfractionated heparin. An echocardiogram was ordered and will be reviewed. In the meanwhile, continue low-dose aspirin. I added atorvastatin. I added small dose metoprolol as well. The patient will require cardiac catheterization at some point but we have to evaluate his neurologic status and treat other underlying medical conditions.  2. Peripheral arterial disease with previous BKA. Status post  toe amputation this morning for gangrene.  3. End-stage renal disease on  hemodialysis: The plan is to get dialysis today.  4. Anemia: Hemoglobin is 7.4, I recommend transfusion of one unit of blood during dialysis.  The case was discussed with Dr. [name] Fries.   Signed:  Kathlyn Sacramento MD, [facility] 04/28/2016, 12:46 PM

## 2016-04-28 NOTE — Procedures (Signed)
Arterial Line Placement: Indication: Frequent blood draws; Invasive BP monitoring.   Consent: Emergent.   Risks and benefits explained to patient and/or family in detail including risk of infection, bleeding, respiratory failure and death..   Hand washing performed prior to starting the procedure.   Procedure: An active timeout was performed and correct patient, name, & ID confirmed. Physicial exam was performed to ensure adequate perfusion.  Using sterile technique, an aterial line was inserted into the LEFT Femoral artery.  Catheter threaded and the needle was removed with appropriate blood return.  Arterial waveform was noted.  After the procedure, the patient's extremities were observed to be pink and warm.  BIOPATCH PLACED  Estimated Blood Loss: None .   Number of Attempts: 1.   Complications: None .  Operator: Jefry Lesinski.   Corrin Parker, M.D.  [facility]  Medical Director [place] Director [facility] Cardio-Pulmonary Department

## 2016-04-28 NOTE — Progress Notes (Signed)
HD COMPLETED  

## 2016-04-28 NOTE — Progress Notes (Signed)
Called [facility] spoke with Dr Tamala Julian regarding elevated troponin. Patient is on heparin drip no new orders given.

## 2016-04-28 NOTE — Op Note (Signed)
Operative note   Surgeon: Dr. Albertine Patricia, DPM.    Assistant: None    Preop diagnosis: Gangrene distal right foot secondary to diabetes and peripheral arterial disease    Postop diagnosis: Same    Procedure:   1. Transmetatarsal amputation right foot          EBL: 30 cc    Anesthesia:general delivered by the anesthesia team after the patient coded. Initially started off with a monitored anesthesia care anesthetic with local anesthesia delivered by me which included 15 cc of 0.5% Marcaine plain    Hemostasis: Ankle tourniquet 250 mmHg pressure for 25 minutes    Specimen: Distal portion of ulcerated right foot along with metatarsals 12345 resected approximately the proximal one third portion of the metatarsal shaft.    Complications: Patient coded during the surgery and surgical processes interrupted while the patient was stabilized. Once patient's heart rate was reestablished and airway reestablished with a intubation the procedure was able to be completed. This occurred at time of closure.    Operative indications: Gangrene with open wound to distal right foot. Patient had gangrenous toes removed 3 days ago by Dr. Ronalee Belts and was stabilized with a wound VAC and underwent an angioplasty to the right lower extremity yesterday    Procedure:  Patient was brought into the OR and placed on the operating table in thesupine position. After anesthesia was obtained theright lower extremity was prepped and draped in usual sterile fashion.  Operative Report: This time to his directed to the distal portion of the right foot there was a large open wound noted at the distal portion where the toes early been resected. Wound VAC and been removed earlier there was some granulation tissue forming . There was still some necrotic nonviable tissue this was resected and an incision was made with 2 similar incisions carried down to bone on both sides. The soft tissue was flapped proximally on the dorsal  aspect and the metatarsals were resected with power equipment at a viable area approximately at the one third shaft area of all 5 metatarsals. Once the osteotomies were performed of the residual metatarsals and dead tissue were sent to pathology for evaluation. Some of the deep soft tissue was sent for culture at the distal portion of the wound. The area was then copiously irrigated and capsular and deep fascial tissue was closed with 3-0 Vicryl and simple interrupted sutures. Superficial fascia was closed with several 3-0 Vicryl sutures which were done and a longitudinal fashion with the vascular flow. After copious irrigation the skin was addressed and was starting to be closed at the time the patient arrested. At this point the anesthesia team started intubation while I perform chest compressions. After appropriate measures were taken and the patient was intubated a pulse was worse restored but patient was then V. tach. At this point electroshock therapy was used to bring the patient into a sinus rhythm and anesthesia team continued to stabilizing. At this point it was felt it was safe to go ahead and closed and the rest of the way. I repaired my hands regloved and gowned as did the surgical technician and the wound was closed the rest of the way.  At this point a sterile dressing was placed across wound consisting of Xeroform gauze 4 x 4's ABDs pads conformation Kerlix.  The patient Was transported from the OR to the PACU closely monitored by the anesthesia team. Patient will be transferred to CCU when appropriate.

## 2016-04-28 NOTE — Consult Note (Signed)
PULMONARY / CRITICAL CARE MEDICINE   Name: [name] MRN: [id number] DOB: 08/04/1961    ADMISSION DATE:  04/25/2016 CONSULTATION DATE:  04/28/2016  REFERRING MD:  Dr. 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  He presented to [facility] on 7/12 for amputation of the right second, third, and fourth toes through the MP joint and excisional debridement of necrotic tissue including skin and subcutaneous tissues, tendon and bone first and fifth ray secondary to atherosclerotic occlusive disease with wound vac placement.  He apparently did not follow through with treatment back in May 2017 and progressed to worsening gangrene to his digits.  The pt had an angiogram of the right foot on [id number] without complication. On 7/15 the pt was taken to the OR for a transmetatarsal amputation during surgery pt became bradycardic then PEA arrested CPR performed for 2 minutes, then the pt proceeded to develop ST elevation with cardiac rhythm changing to polymorphic ventricular tachycardic per ACLS protocol 1 mg epinephrine was administered and pt was shocked x1 post shock rhythm noted was sinus tachycardia with frequent PVC's.  PCCM consulted post cardiac arrest for mechanical ventilation management.  PAST MEDICAL HISTORY :  He  has a past medical history of Chronic kidney disease; Hypertension; Diabetes mellitus without complication ([place]); Hypercholesteremia; Atherosclerosis; Stroke [facility]); and Anemia.  PAST SURGICAL HISTORY: He  has past surgical history that includes AV fistula placement (Left, 2015); Cardiac catheterization (N/A, 10/03/2015); Cardiac catheterization (N/A, 01/16/2016); Cardiac catheterization (01/16/2016);  Leg amputation below knee (Left); Wound debridement (Right, 04/25/2016); Application if wound vac (Right, 04/25/2016); and Amputation (Right, 04/25/2016).  No Known Allergies  No current facility-administered medications on file prior to encounter.   Current Outpatient Prescriptions on File Prior to Encounter  Medication Sig  . sevelamer carbonate (RENVELA) 800 MG tablet Take 800 mg by mouth 3 (three) times daily with meals.  Marland Kitchen aspirin 325 MG tablet Take 325 mg by mouth daily.  . cinacalcet (SENSIPAR) 30 MG tablet Take 60 mg by mouth daily.   Marland Kitchen glipiZIDE (GLUCOTROL) 10 MG tablet Take 10 mg by mouth daily before breakfast.  . midodrine (PROAMATINE) 10 MG tablet Take 10 mg by mouth 3 (three) times daily.    FAMILY HISTORY:  His indicated that his mother is deceased. He indicated that his father is alive.   SOCIAL HISTORY: He  reports that he has never smoked. He does not have any smokeless tobacco history on file. He reports that he does not drink alcohol or use illicit drugs.  REVIEW OF SYSTEMS:   Unable to assess pt intubated and sedated.  SUBJECTIVE:  Critically ill appearing male sedated and intubated.  VITAL SIGNS: BP 158/90 mmHg  Pulse 87  Temp(Src) 99 F (37.2 C) (Oral)  Resp 16  Ht 5\' 7"  (1.702 m)  Wt 253 lb 9.6 oz (115.032 kg)  BMI 39.71 kg/m2  SpO2 93%  HEMODYNAMICS:    VENTILATOR SETTINGS:    INTAKE / OUTPUT: I/O last 3 completed shifts: In: 46 [P.O.:60] Out: 1 [Stool:1]  PHYSICAL EXAMINATION: General:  Critically ill appearing male Neuro:  Sedated, withdraws from painful stimulation, does not follow commands, bilateral pupils 4 mm HEENT:  Supple, no JVD Cardiovascular:  Sinus tach, s1s2, PVC's, no M/R/G Lungs:  course throughout even, non labored, mechanically ventilated Abdomen:  Obese, hypoactive BS x4, soft, non tender, non distended Musculoskeletal:  Left BKA, right foot transmetatarsal amputation Skin:  Right lower extremity incision dressing dry  and intact   LABS:  BMET  Recent Labs Lab 04/26/16 0459 04/27/16 0444 04/28/16 0355  NA 139 136 140  K 4.9 4.6 5.0  CL 97* 96* 99*  CO2 32 31 30  BUN 49* 36* 52*  CREATININE 7.05* 5.62* 7.16*  GLUCOSE 178* 131* 162*    Electrolytes  Recent Labs Lab 04/26/16 0459 04/27/16 0444 04/28/16 0355  CALCIUM 8.3* 8.0* 8.5*    CBC  Recent Labs Lab 04/26/16 0459 04/27/16 0444 04/28/16 0355  WBC 10.0 9.9 9.8  HGB 7.5* 7.6* 7.9*  HCT 24.3* 24.3* 25.2*  PLT 290 253 279    Coag's  Recent Labs Lab 04/23/16 1503  APTT 26  INR 1.31    Sepsis Markers No results for input(s): LATICACIDVEN, PROCALCITON, O2SATVEN in the last 168 hours.  ABG No results for input(s): PHART, PCO2ART, PO2ART in the last 168 hours.  Liver Enzymes No results for input(s): AST, ALT, ALKPHOS, BILITOT, ALBUMIN in the last 168 hours.  Cardiac Enzymes No results for input(s): TROPONINI, PROBNP in the last 168 hours.  Glucose  Recent Labs Lab 04/26/16 1624 04/26/16 2212 04/27/16 0743 04/27/16 1127 04/27/16 2120 04/28/16 0715  GLUCAP 126* 157* 132* 123* 185* 135*    Imaging No results found.   STUDIES:  None  CULTURES: Aerobic/Anaerobic Culture of right foot 7/15>>  ANTIBIOTICS: Zosyn 7/15>> Vancomycin 7/12>>7/15  SIGNIFICANT EVENTS: -7/12 pt admitted [facility] for amputation of the right second, third, and fourth toes through the MP joint and excisional debridement of necrotic tissue including skin and subcutaneous tissues, tendon and bone first and fifth ray secondary to atherosclerotic occlusive disease with wound vac placement -7/14 right lower extremity angiogram performed -7/15 pt taken to OR for right tansmetatarsal amputation during surgery pt cardiac arrested -7/15 PCCM consulted post cardiac arrest and mechanical vent management   LINES/TUBES: PIV x1 ETT 7/15>> R internal jugular CVL 7/15>> Left femoral arterial line 7/15>> OG tube 7/15>>  DISCUSSION: This is  a 55 yo male who presented to [facility] on 7/12 for amputation of the right second, third, and fourth toes through the MP joint and excisional debridement of necrotic tissue including skin and subcutaneous tissues, tendon and bone first and fifth ray secondary to atherosclerotic occlusive disease with wound vac placement. The pt had an angiogram of the right foot on 99991111 without complication. On 7/15 the pt was taken to the OR for a transmetatarsal amputation and cardiac arrested during surgery   ASSESSMENT / PLAN:   PULMONARY A: Mechanical ventilation post cardiac arrest  P:   Full vent support ABG today 7/15 ABG and CXR in am 7/16 Pt followed commands post cardiac arrest therefore no hypothermic protocol  CARDIOVASCULAR A:  Cardiac arrest due to polymorphic ventricular tachycardia during surgery most likely etiology is an ischemic cardiac event  Hx: HTN, Atherosclerosis, Stroke P:  Trend troponin's Echo pending  Cardiology consulted appreciate input Heparin drip per Cardiology if CT of head negative pharmacy to dose Telemetry monitoring Will hold midodrine for now pt hypertensive 7/15  RENAL A:   Hx: Chronic Kidney Diseae P:   Nephrology consulted appreciate input Follow BMP's Replace electrolytes as indicated Hemodialysis today 7/15  GASTROINTESTINAL A:   No acute issues  P:   Pepcid for PUD Continue senokot-s  NPO for now  HEMATOLOGIC A:   Anemia P:  Trend CBC Heparin drip for VTE prophylaxis Monitor for s/sx of bleeding Transfuse 1 unit pRBC today 7/15  INFECTIOUS A:   Right foot necrotic toes- status post excisional debridement of second, third, and fourth toes on 04/25/2016; status post right foot transmetatarsal amputation 04/28/2016 Leukocytosis  P:   Discontinue vancomycin, continue zosyn 7/15 Trend WBC and monitor fever curve Trend PCT's and monitor lactic acid   ENDOCRINE A:   Hx: DM P:   CBG's q4hrs SSI  NEUROLOGIC A:   Post surgery pain P:    CT of Head 7/15 RASS goal: 0 to -1 WUA daily Fentanyl drip, prn fentanyl and versed to maintain RAS PRN morphine and fentanyl for pain management   FAMILY  - Updates: Will update family once pts wife arrives   - Inter-disciplinary family meet or Palliative Care meeting due by:  05/05/16  Marda Stalker, [place] Pager [phone] (please enter 7 digits) [place] Pager [phone] (please enter 7 digits)

## 2016-04-28 NOTE — Anesthesia Preprocedure Evaluation (Addendum)
Anesthesia Evaluation  Patient identified by MRN, date of birth, ID band Patient awake    Reviewed: Allergy & Precautions, NPO status   History of Anesthesia Complications Negative for: history of anesthetic complications  Airway Mallampati: III  TM Distance: >3 FB Neck ROM: Full    Dental  (+) Missing, Poor Dentition   Pulmonary shortness of breath, with exertion and at rest,  Dry cough for 3 months   breath sounds clear to auscultation (-) decreased breath sounds      Cardiovascular Exercise Tolerance: Poor hypertension, Pt. on medications (-) angina+ Peripheral Vascular Disease and + DOE  (-) Past MI  Rhythm:Regular Rate:Normal     Neuro/Psych CVA (2008, right sided weakness), Residual Symptoms negative psych ROS   GI/Hepatic negative GI ROS, Neg liver ROS, neg GERD  ,  Endo/Other  diabetes, Type 2, Oral Hypoglycemic AgentsMorbid obesity  Renal/GU Dialysis and ESRFRenal disease (10 years on dialysis)     Musculoskeletal Some back pain   Abdominal (+) + obese,   Peds  Hematology  (+) anemia ,   Anesthesia Other Findings Past Medical History:   Chronic kidney disease                                       Hypertension                                                 Diabetes mellitus without complication (HCC)                 Hypercholesteremia                                           Atherosclerosis                                              Stroke ([place])                                                 Anemia                                                       Reproductive/Obstetrics                           Anesthesia Physical  Anesthesia Plan  ASA: III  Anesthesia Plan: General   Post-op Pain Management:    Induction: Intravenous  Airway Management Planned: Simple Face Mask  Additional Equipment:   Intra-op Plan:   Post-operative Plan:   Informed Consent: I have  reviewed the patients History and Physical, chart, labs and discussed the procedure including the risks, benefits and alternatives for the proposed anesthesia with the patient or authorized representative  who has indicated his/her understanding and acceptance.     Plan Discussed with: CRNA, Anesthesiologist and Surgeon  Anesthesia Plan Comments:       Anesthesia Quick Evaluation

## 2016-04-28 NOTE — Progress Notes (Signed)
HD STARTED  

## 2016-04-28 NOTE — Progress Notes (Signed)
[facility]  ROUNDING NOTE   Subjective:   Cardiac arrest during surgery this morning. Moved to ICU. Concern for NSTEMI.  Patient now intubated and sedated.   Objective:  Vital signs in last 24 hours:  Temp:  [96.8 F (36 C)-99 F (37.2 C)] 97.5 F (36.4 C) (07/15 1000) Pulse Rate:  [76-102] 99 (07/15 1005) Resp:  [15-22] 15 (07/15 1005) BP: (130-158)/(79-90) 136/80 mmHg (07/15 1005) SpO2:  [88 %-100 %] 100 % (07/15 1227) FiO2 (%):  [50 %] 50 % (07/15 1227) Weight:  [115.032 kg (253 lb 9.6 oz)-116.62 kg (257 lb 1.6 oz)] 115.032 kg (253 lb 9.6 oz) (07/15 0518)  Weight change: -0.68 kg (-1 lb 8 oz) Filed Weights   04/27/16 0526 04/28/16 0030 04/28/16 0518  Weight: 116.5 kg (256 lb 13.4 oz) 116.62 kg (257 lb 1.6 oz) 115.032 kg (253 lb 9.6 oz)    Intake/Output: I/O last 3 completed shifts: In: 45 [P.O.:60] Out: 1 [Stool:1]   Intake/Output this shift:  Total I/O In: 500 [I.V.:500] Out: 10 [Blood:10]  Physical Exam: General: Critically ill  Head: Normocephalic, atraumatic. Moist oral mucosal membranes  Eyes: Anicteric, PERRL  Neck: Supple, trachea midline  Lungs:  PRVC FIo2 50%  Heart: Regular rate and rhythm  Abdomen:  Soft, nontender, obese  Extremities: no peripheral edema.  Neurologic: Left BKA, right foot in wound vac  Skin: No lesions  Access: Left arm AVF    Basic Metabolic Panel:  Recent Labs Lab 04/23/16 1503 04/25/16 0945 04/25/16 1513 04/26/16 0459 04/27/16 0444 04/28/16 0355 04/28/16 1111  NA 144 139  --  139 136 140 138  K 4.2 4.1  --  4.9 4.6 5.0 5.2*  CL 99*  --   --  97* 96* 99* 99*  CO2 33*  --   --  32 31 30 29   GLUCOSE 180* 166*  --  178* 131* 162* 183*  BUN 45*  --   --  49* 36* 52* 57*  CREATININE 6.87*  --  5.90* 7.05* 5.62* 7.16* 7.59*  CALCIUM 7.8*  --   --  8.3* 8.0* 8.5* 8.0*  MG  --   --   --   --   --   --  2.4  PHOS  --   --   --   --   --   --  6.4*    Liver Function Tests:  Recent Labs Lab  04/28/16 1111  AST 23  ALT 8*  ALKPHOS 245*  BILITOT 0.5  PROT 6.2*  ALBUMIN 3.1*   No results for input(s): LIPASE, AMYLASE in the last 168 hours. No results for input(s): AMMONIA in the last 168 hours.  CBC:  Recent Labs Lab 04/23/16 1503  04/25/16 1513 04/26/16 0459 04/27/16 0444 04/28/16 0355 04/28/16 1111  WBC 10.9*  --  9.8 10.0 9.9 9.8 15.5*  NEUTROABS 7.9*  --   --   --   --   --   --   HGB 8.0*  < > 8.1* 7.5* 7.6* 7.9* 7.3*  HCT 25.3*  < > 26.3* 24.3* 24.3* 25.2* 23.9*  MCV 87.4  --  88.1 89.0 88.6 87.9 87.3  PLT 308  --  308 290 253 279 300  < > = values in this interval not displayed.  Cardiac Enzymes:  Recent Labs Lab 04/28/16 1111  TROPONINI 0.03*    BNP: Invalid input(s): POCBNP  CBG:  Recent Labs Lab 04/27/16 0743 04/27/16 1127 04/27/16 2120 04/28/16 0715 04/28/16  [place]    Microbiology: Results for orders placed or performed during the hospital encounter of 04/25/16  Aerobic/Anaerobic Culture (surgical/deep wound)     Status: None (Preliminary result)   Collection Time: 04/28/16  9:02 AM  Result Value Ref Range Status   Specimen Description WOUND RIGHT FOOT  Final   Special Requests   Final    POF VANCOMYCIN AND ZINACEF Performed at [facility]    Gram Stain PENDING  Incomplete   Culture PENDING  Incomplete   Report Status PENDING  Incomplete  MRSA PCR Screening     Status: None   Collection Time: 04/28/16 10:06 AM  Result Value Ref Range Status   MRSA by PCR NEGATIVE NEGATIVE Final    Comment:        The GeneXpert MRSA Assay (FDA approved for NASAL specimens only), is one component of a comprehensive MRSA colonization surveillance program. It is not intended to diagnose MRSA infection nor to guide or monitor treatment for MRSA infections.     Coagulation Studies: No results for input(s): LABPROT, INR in the last 72 hours.  Urinalysis: No results for input(s): COLORURINE,  LABSPEC, PHURINE, GLUCOSEU, HGBUR, BILIRUBINUR, KETONESUR, PROTEINUR, UROBILINOGEN, NITRITE, LEUKOCYTESUR in the last 72 hours.  Invalid input(s): APPERANCEUR    Imaging: Ct Head Wo Contrast  04/28/2016  CLINICAL DATA:  55 year old male with a history of cardiac arrest EXAM: CT HEAD WITHOUT CONTRAST TECHNIQUE: Contiguous axial images were obtained from the base of the skull through the vertex without intravenous contrast. COMPARISON:  02/17/2007 FINDINGS: Unremarkable appearance of the calvarium without acute fracture or aggressive lesion. Unremarkable appearance of the scalp soft tissues. Unremarkable appearance of the bilateral orbits. Enteric and orogastric tube incompletely imaged. Mastoid air cells are clear. No significant paranasal sinus disease No acute intracranial hemorrhage.  No midline shift or mass effect. Hypodensity in the periventricular white matter overlying the bilateral frontal horn of the lateral ventricles. Focal hypodensity in the left centrum semi ovale, new from the comparison. Gray-white differentiation relatively maintained. Extensive calcifications of the vasculature. IMPRESSION: No CT evidence of acute intracranial abnormality. Evidence of interval lacunar infarction/ischemic changes of the left centrum semiovale, new from the comparison CT of 2008. Intracranial atherosclerosis. Signed, Dulcy Fanny. Earleen Newport, DO [facility] Radiology Electronically Signed   By: Corrie Mckusick D.O.   On: 04/28/2016 14:16   Dg Chest Port 1 View  04/28/2016  CLINICAL DATA:  Acute respiratory failure. EXAM: PORTABLE CHEST 1 VIEW COMPARISON:  Radiographs of December 02, 2008. FINDINGS: Stable cardiomediastinal silhouette. Atherosclerosis of thoracic aorta is noted. Endotracheal tube projected over tracheal air shadow with distal tip 5 cm above the carina. Nasogastric tube is seen entering stomach. Right internal jugular catheter is noted with distal tip in  expected position of SVC. No pneumothorax or pleural effusion is noted. Right upper lobe opacity is noted concerning for edema or inflammation. Left perihilar opacity is noted concerning for edema or inflammation. IMPRESSION: Endotracheal and nasogastric tubes in grossly good position. Bilateral lung opacities are noted concerning for edema or pneumonia. Electronically Signed   By: Marijo Conception, M.D.   On: 04/28/2016 11:38     Medications:   . fentaNYL infusion INTRAVENOUS Stopped (04/28/16 1210)   . sodium chloride   Intravenous Once  . aspirin EC  81 mg Oral Daily  . atorvastatin  40 mg Oral q1800  . famotidine (PEPCID) IV  20 mg Intravenous Q12H  .  heparin  5,000 Units Subcutaneous Q8H  . insulin aspart  0-5 Units Subcutaneous QHS  . insulin aspart  0-9 Units Subcutaneous TID WC  . metoprolol tartrate  25 mg Oral BID  . midodrine  10 mg Oral TID PC  . senna-docusate  2 tablet Oral BID  . sevelamer carbonate  0.8 g Oral TID WC  . sodium chloride flush  3 mL Intravenous Q12H   sodium chloride, acetaminophen **OR** acetaminophen, fentaNYL, HYDROcodone-acetaminophen, midazolam, midazolam, morphine injection, ondansetron **OR** ondansetron (ZOFRAN) IV, polyethylene glycol, sennosides, sodium chloride flush, sodium phosphate, sorbitol  Assessment/ Plan:  Mr. DEUNTAY FRIESEN is a 55 y.o. Hispanic male with End Stage Renal Disease on hemodialysis since 2008, diabetes mellitus type II, CVA, PVD, status post left BKA, hyperlipidemia, who was admitted to [facility] on 04/25/2016 with peripheral vascular disease  TTS [facility] [place].   1. End Stage Renal Disease: will do hemodialysis treatment today. UF 0. 2K bath Continue TTS schedule.   2. Hypertension: blood pressure at goal.  - midodrine usually before treatment but now with hypertension. Low threshold for pressors.   3. Diabetes mellitus type II with chronic kidney disease - Continue glucose control.   4. Secondary  Hyperparathyroidism:  - hold cinacalcet and binders.   5. Anemia of chronic kidney disease: PRBC transfusion ordered   LOS: [place], [place] 7/15/20172:21 PM

## 2016-04-28 NOTE — H&P (Signed)
H and P has been reviewed and no changes are noted.  

## 2016-04-28 NOTE — Progress Notes (Signed)
Clinical Education officer, museum (CSW) met with patient's wife, sister and pastor with a [place] interpreter. CSW explained SNF process and provided bed offers. CSW answered all family's questions. CSW explained that patient will have to be weaned off the vent before he can go to SNF. Wife reported that she will review bed offers and get back with CSW. CSW will continue to follow and assist as needed.   [employer], LCSW [phone]

## 2016-04-28 NOTE — Transfer of Care (Signed)
Immediate Anesthesia Transfer of Care Note  Patient: [name]  Procedure(s) Performed: Procedure(s): TRANSMETATARSAL AMPUTATION (Right)  Patient Location: ICU  Anesthesia Type:General  Level of Consciousness: sedated and Patient remains intubated per anesthesia plan  Airway & Oxygen Therapy: Patient remains intubated per anesthesia plan and Patient placed on Ventilator (see vital sign flow sheet for setting)  Post-op Assessment: Report given to RN and Post -op Vital signs reviewed and stable  Post vital signs: Reviewed and stable  Last Vitals:  Filed Vitals:   04/28/16 0515 04/28/16 1000  BP: 158/90 136/80  Pulse: 87 102  Temp: 37.2 C 36.4 C  Resp: 16 15    Last Pain:  Filed Vitals:   04/28/16 1005  PainSc: 0-No pain     Patient with CPR initiated in OR after PEA episode.  Intubation, CPR, defibrillation in OR.  See anesthesia record.  Return of spont circulation.      Complications: respiratory complications and cardiovascular complications

## 2016-04-28 NOTE — Progress Notes (Signed)
[facility] [place] NAME: [name]    MR#:  [id number]  DATE OF BIRTH:  1961-09-14  SUBJECTIVE:  CHIEF COMPLAINT:  No chief complaint on file.  -Spanish speaking patient, used an interpreter. -late entry note from this AM 8:00AM - Shin seen prior to going to his transmetatarsal amputation. Sister at bedside. All questions answered. Appeared A little tachypneic. He denied any discomfort. -No chest pain.   REVIEW OF SYSTEMS:  Review of Systems  Constitutional: Negative for fever, chills and malaise/fatigue.  HENT: Negative for ear discharge, ear pain and nosebleeds.   Eyes: Negative for blurred vision and double vision.  Respiratory: Negative for cough, shortness of breath and wheezing.   Cardiovascular: Negative for chest pain, palpitations and leg swelling.  Gastrointestinal: Negative for nausea, vomiting, abdominal pain, diarrhea and constipation.  Genitourinary: Negative for dysuria.  Musculoskeletal: Positive for joint pain. Negative for myalgias.       Much improved Right foot pain  Neurological: Negative for dizziness, sensory change, speech change, focal weakness, seizures and headaches.  Psychiatric/Behavioral: Negative for depression.    DRUG ALLERGIES:  No Known Allergies  VITALS:  Blood pressure 136/80, pulse 99, temperature 97.5 F (36.4 C), temperature source Axillary, resp. rate 15, height 5\' 7"  (1.702 m), weight 115.032 kg (253 lb 9.6 oz), SpO2 100 %.  PHYSICAL EXAMINATION:  Physical Exam  GENERAL:  55 y.o.-year-old patient lying in the bed with no acute distress.Slightly tachypneic appearing  EYES: Pupils equal, round, reactive to light and accommodation. No scleral icterus. Extraocular muscles intact.  HEENT: Head atraumatic, normocephalic. Oropharynx and nasopharynx clear.  NECK:  Supple, no jugular venous distention. No thyroid enlargement, no tenderness.  LUNGS: Normal breath sounds bilaterally, no  wheezing, rales,rhonchi or crepitation. No use of accessory muscles of respiration.  CARDIOVASCULAR: S1, S2 normal. No murmurs, rubs, or gallops.  ABDOMEN: Soft, nontender, nondistended. Bowel sounds present. No organomegaly or mass.  EXTREMITIES: Left BKA, right fourth toes amputated, has a wound VAC in place. Some swelling of the foot noted. Left arm AV fistula present NEUROLOGIC: Cranial nerves II through XII are intact. Muscle strength 5/5 in all extremities except limited in right foot due to recent surgery. Left leg is status post BKA. Sensation intact. Gait not checked.  PSYCHIATRIC: The patient is alert and oriented x 3.  SKIN: No obvious rash, lesion, or ulcer.    LABORATORY PANEL:   CBC  Recent Labs Lab 04/28/16 1111  WBC 15.5*  HGB 7.3*  HCT 23.9*  PLT 300   ------------------------------------------------------------------------------------------------------------------  Chemistries   Recent Labs Lab 04/28/16 1111  NA 138  K 5.2*  CL 99*  CO2 29  GLUCOSE 183*  BUN 57*  CREATININE 7.59*  CALCIUM 8.0*  MG 2.4  AST 23  ALT 8*  ALKPHOS 245*  BILITOT 0.5   ------------------------------------------------------------------------------------------------------------------  Cardiac Enzymes  Recent Labs Lab 04/28/16 1111  TROPONINI 0.03*   ------------------------------------------------------------------------------------------------------------------  RADIOLOGY:  Dg Chest Port 1 View  04/28/2016  CLINICAL DATA:  Acute respiratory failure. EXAM: PORTABLE CHEST 1 VIEW COMPARISON:  Radiographs of December 02, 2008. FINDINGS: Stable cardiomediastinal silhouette. Atherosclerosis of thoracic aorta is noted. Endotracheal tube projected over tracheal air shadow with distal tip 5 cm above the carina. Nasogastric tube is seen entering stomach. Right internal jugular catheter is noted with distal tip in expected position of SVC. No pneumothorax or pleural effusion is  noted. Right upper lobe opacity is noted concerning for edema or  inflammation. Left perihilar opacity is noted concerning for edema or inflammation. IMPRESSION: Endotracheal and nasogastric tubes in grossly good position. Bilateral lung opacities are noted concerning for edema or pneumonia. Electronically Signed   By: Marijo Conception, M.D.   On: 04/28/2016 11:38    EKG:   Orders placed or performed during the hospital encounter of 04/25/16  . EKG 12-Lead  . EKG 12-Lead  . EKG 12-Lead  . EKG 12-Lead    ASSESSMENT AND PLAN:   55 year old male with past medical history significant for end-stage renal disease on Tuesday Thursday Saturday hemodialysis, diabetes mellitus, peripheral vascular disease status post left BKA in the past admitted with necrosis of right foot second, third and fourth toes.  #1 right foot necrotic toes-status post excisional debridement of second third and fourth dose on 04/25/2016.Status post angiogram done to improve blood flow to the right leg on 04/27/2016 -Appreciate vascular and podiatry consults -For transmetatarsal amputation of the right foot today -Continue vancomycin for now. Has a wound VAC in place.  #2 end-stage renal disease on hemodialysis-on Tuesday, Thursday and Saturday schedule. Nephrology consulted. -For dialysis today after his surgery -Also takes midodrine at home  #3 diabetes mellitus-A1c is only 5.6. Sugars are decently controlled. -Continue sliding scale insulin  #4 PVD-improved blood flow to right leg after angiogram and angioplasty -Continue aspirin.  #5 acute on chronic anemia-anemia of chronic kidney disease. No transfusion indicated unless hemoglobin less than 7. -Monitor at this time. Epogen with dialysis.  #6 DVT prophylaxis-subcutaneous heparin.  After his procedures over the weekend, he will need physical therapy consult and then discharge planning Likely might need SNF- social worker involved   All the records are reviewed  and case discussed with Care Management/Social Workerr. Management plans discussed with the patient, family and they are in agreement.  CODE STATUS: Full code  TOTAL TIME TAKING CARE OF THIS PATIENT: 28 minutes.    ADDENDUM: Went to see patient in ICU, he had a cardiac arrest while in the OR, after the amputation was finished and skin was being sutured. Apparently ST elevations were noted and then patient went into V. tach. He had CPR performed and shock given once. -He is in normal sinus rhythm now, intubated and in the ICU. -Cardiology consult is pending at this time. -Intensivist will take over the care at this time. Updated his sister in the waiting area.    Gladstone Lighter M.D on 04/28/2016 at 12:51 PM  Between 7am to 6pm - Pager - [phone]  After 6pm go to [email or web address] - password [email or web address] [facility]  Office  [phone]  CC: Primary care physician; PROVIDER NOT IN SYSTEM

## 2016-04-28 NOTE — Progress Notes (Signed)
PRE DIALYSIS ASSESSMENT 

## 2016-04-28 NOTE — Progress Notes (Signed)
   04/28/16 0900  Clinical Encounter Type  Visited With Patient not available  Visit Type Code  Referral From Nurse  Consult/Referral To Chaplain   I responded to code going on in OR but waited out front. I received word that patient would be transported to ICU and no family present at moment. I will await for notification that family has arrived and care for family and patient as applicable.   [place] [phone]

## 2016-04-28 NOTE — Progress Notes (Signed)
Spoke with [name] regarding patients BP in the 170's to the 180's. Patient will be getting dialysis in the next 2 hours. At this point we will hold off and no new orders given due to dialysis dropping patients BP.

## 2016-04-28 NOTE — Progress Notes (Addendum)
ANTICOAGULATION CONSULT NOTE - Initial Consult  Pharmacy Consult for Heparin  Indication: chest pain/ACS  No Known Allergies  Patient Measurements: Height: 5\' 7"  (170.2 cm) Weight: 253 lb 9.6 oz (115.032 kg) IBW/kg (Calculated) : 66.1 Heparin Dosing Weight: 91.7 kg   Vital Signs: Temp: 98.5 F (36.9 C) (07/15 1450) Temp Source: Oral (07/15 1450) BP: 143/79 mmHg (07/15 1515) Pulse Rate: 88 (07/15 1515)  Labs:  Recent Labs  04/27/16 0444 04/28/16 0355 04/28/16 1111  HGB 7.6* 7.9* 7.3*  HCT 24.3* 25.2* 23.9*  PLT 253 279 300  CREATININE 5.62* 7.16* 7.59*  TROPONINI  --   --  0.03*    Estimated Creatinine Clearance: 13.2 mL/min (by C-G formula based on Cr of 7.59).   Medical History: Past Medical History  Diagnosis Date  . Chronic kidney disease   . Hypertension   . Diabetes mellitus without complication ([place])   . Hypercholesteremia   . Atherosclerosis   . Stroke ([place])   . Anemia     Medications:  Prescriptions prior to admission  Medication Sig Dispense Refill Last Dose  . sevelamer carbonate (RENVELA) 800 MG tablet Take 800 mg by mouth 3 (three) times daily with meals.   04/24/2016 at Unknown time  . aspirin 325 MG tablet Take 325 mg by mouth daily.   04/23/2016  . cinacalcet (SENSIPAR) 30 MG tablet Take 60 mg by mouth daily.    04/23/2016  . glipiZIDE (GLUCOTROL) 10 MG tablet Take 10 mg by mouth daily before breakfast.   04/23/2016  . midodrine (PROAMATINE) 10 MG tablet Take 10 mg by mouth 3 (three) times daily.   04/24/2016    Assessment: Pharmacy consulted to dose heparin in this 55 year old male with ACS.  Pt was previously on heparin 5000 units SQ Q8H, last dose on 7/15 @ 0500.     Goal of Therapy:  Heparin level 0.3-0.7 units/ml Monitor platelets by anticoagulation protocol: Yes   Plan:  Give 4000 units bolus x 1 Start heparin infusion at 1200 units/hr  Will draw 1st HL 6 hrs after start of drip.   [name] D 04/28/2016,3:49 PM

## 2016-04-28 NOTE — Procedures (Signed)
Central Venous Catheter Placement: Indication: Patient receiving vesicant or irritant drug.; Patient receiving intravenous therapy for longer than 5 days.; Patient has limited or no vascular access.   Consent:emergent  Risks and benefits explained in detail including risk of infection, bleeding, respiratory failure and death..   Hand washing performed prior to starting the procedure.   Procedure: An active timeout was performed and correct patient, name, & ID confirmed.  After explaining risk and benefits, patient was positioned correctly for central venous access. Patient was prepped using strict sterile technique including chlorohexadine preps, sterile drape, sterile gown and sterile gloves.  The area was prepped, draped and anesthetized in the usual sterile manner. Patient comfort was obtained.  A triple lumen catheter was placed in RT Internal Jugular Vein There was good blood return, catheter caps were placed on lumens, catheter flushed easily, the line was secured and a sterile dressing and BIO-PATCH applied.   Ultrasound was used to visualize vasculature and guidance of needle.   Number of Attempts: 1 Complications:none Estimated Blood Loss: none Chest Radiograph indicated and ordered.  Operator: Bryant Lipps/[place]ak[place].   Corrin Parker, M.D.  [facility]  Medical Director [place] Director [facility] Cardio-Pulmonary Department

## 2016-04-28 NOTE — Progress Notes (Addendum)
I  received information that patient is going to ICU post- surgery. Report given to [facility]. Sister was notified of the transfer.

## 2016-04-28 NOTE — Progress Notes (Signed)
*  PRELIMINARY RESULTS* Echocardiogram 2D Echocardiogram has been performed.  [name] 04/28/2016, 2:30 PM

## 2016-04-28 NOTE — Progress Notes (Signed)
Patient transported to and from CT without complication.  Patient placed back on servo i vent upon arrival back to room ICU3 at 1335

## 2016-04-28 NOTE — Anesthesia Procedure Notes (Addendum)
Procedure Name: Intubation Date/Time: 04/28/2016 9:17 AM Performed by: Clinton Sawyer Pre-anesthesia Checklist: Emergency Drugs available, Suction available, Patient being monitored and Patient identified Patient Re-evaluated:Patient Re-evaluated prior to inductionOxygen Delivery Method: Circle system utilized Preoxygenation: Pre-oxygenation with 100% oxygen Laryngoscope Size: Mac and 4 Grade View: Grade II Tube size: 7.5 mm Number of attempts: 1 Airway Equipment and Method: Stylet Placement Confirmation: ETT inserted through vocal cords under direct vision,  positive ETCO2,  CO2 detector and breath sounds checked- equal and bilateral Secured at: 22 cm Tube secured with: Tape Dental Injury: Teeth and Oropharynx as per pre-operative assessment

## 2016-04-29 ENCOUNTER — Inpatient Hospital Stay: Payer: Medicare Other

## 2016-04-29 LAB — BASIC METABOLIC PANEL
ANION GAP: 10 (ref 5–15)
BUN: 40 mg/dL — ABNORMAL HIGH (ref 6–20)
CALCIUM: 8.8 mg/dL — AB (ref 8.9–10.3)
CO2: 29 mmol/L (ref 22–32)
Chloride: 100 mmol/L — ABNORMAL LOW (ref 101–111)
Creatinine, Ser: 6.33 mg/dL — ABNORMAL HIGH (ref 0.61–1.24)
GFR, EST AFRICAN AMERICAN: 10 mL/min — AB (ref >60)
GFR, EST NON AFRICAN AMERICAN: 9 mL/min — AB (ref >60)
Glucose, Bld: 128 mg/dL — ABNORMAL HIGH (ref 65–99)
Potassium: 5.6 mmol/L — ABNORMAL HIGH (ref 3.5–5.1)
SODIUM: 139 mmol/L (ref 135–145)

## 2016-04-29 LAB — BLOOD GAS, ARTERIAL
ACID-BASE EXCESS: 6.1 mmol/L — AB (ref 0.0–3.0)
Bicarbonate: 32.6 mEq/L — ABNORMAL HIGH (ref 21.0–28.0)
FIO2: 0.4
Mechanical Rate: 15
O2 SAT: 71.9 %
PCO2 ART: 59 mmHg — AB (ref 32.0–48.0)
PEEP: 5 cmH2O
PH ART: 7.35 (ref 7.350–7.450)
Patient temperature: 37
VT: 500 mL
pO2, Arterial: 40 mmHg — CL (ref 83.0–108.0)

## 2016-04-29 LAB — CBC
HEMATOCRIT: 26 % — AB (ref 40.0–52.0)
Hemoglobin: 8 g/dL — ABNORMAL LOW (ref 13.0–18.0)
MCH: 27.4 pg (ref 26.0–34.0)
MCHC: 30.8 g/dL — ABNORMAL LOW (ref 32.0–36.0)
MCV: 89 fL (ref 80.0–100.0)
PLATELETS: 251 10*3/uL (ref 150–440)
RBC: 2.92 MIL/uL — ABNORMAL LOW (ref 4.40–5.90)
RDW: 21.2 % — AB (ref 11.5–14.5)
WBC: 13.3 10*3/uL — AB (ref 3.8–10.6)

## 2016-04-29 LAB — TYPE AND SCREEN
ABO/RH(D): O POS
ANTIBODY SCREEN: NEGATIVE
Unit division: 0

## 2016-04-29 LAB — GLUCOSE, CAPILLARY
GLUCOSE-CAPILLARY: 126 mg/dL — AB (ref 65–99)
Glucose-Capillary: 137 mg/dL — ABNORMAL HIGH (ref 65–99)
Glucose-Capillary: 99 mg/dL (ref 65–99)
Glucose-Capillary: 146 mg/dL — ABNORMAL HIGH (ref 65–99)

## 2016-04-29 LAB — HEPARIN LEVEL (UNFRACTIONATED)
HEPARIN UNFRACTIONATED: 0.22 [IU]/mL — AB (ref 0.30–0.70)
HEPARIN UNFRACTIONATED: 0.25 [IU]/mL — AB (ref 0.30–0.70)

## 2016-04-29 LAB — ECHOCARDIOGRAM COMPLETE
HEIGHTINCHES: 67 in
Weight: 4057.6 oz

## 2016-04-29 LAB — TROPONIN I
TROPONIN I: 0.07 ng/mL — AB (ref <0.03)
Troponin I: 0.06 ng/mL (ref <0.03)
Troponin I: 0.08 ng/mL (ref <0.03)
Troponin I: 0.09 ng/mL (ref <0.03)

## 2016-04-29 LAB — HEPATITIS B SURFACE ANTIGEN: HEP B S AG: NEGATIVE

## 2016-04-29 LAB — PROCALCITONIN: Procalcitonin: 3.15 ng/mL

## 2016-04-29 MED ORDER — ASPIRIN 81 MG PO CHEW
81.0000 mg | CHEWABLE_TABLET | ORAL | Status: AC
  Administered 2016-04-30: 81 mg via ORAL
  Filled 2016-04-29: qty 1

## 2016-04-29 MED ORDER — SODIUM CHLORIDE 0.9 % IV SOLN
INTRAVENOUS | Status: DC
  Administered 2016-04-30 (×2): via INTRAVENOUS

## 2016-04-29 MED ORDER — FAMOTIDINE IN NACL 20-0.9 MG/50ML-% IV SOLN
20.0000 mg | INTRAVENOUS | Status: DC
  Administered 2016-04-30: 20 mg via INTRAVENOUS
  Filled 2016-04-29: qty 50

## 2016-04-29 MED ORDER — HEPARIN BOLUS VIA INFUSION
2800.0000 [IU] | Freq: Once | INTRAVENOUS | Status: AC
  Administered 2016-04-29: 2800 [IU] via INTRAVENOUS
  Filled 2016-04-29: qty 2800

## 2016-04-29 MED ORDER — CINACALCET HCL 30 MG PO TABS
30.0000 mg | ORAL_TABLET | Freq: Every day | ORAL | Status: DC
  Administered 2016-04-29 – 2016-05-02 (×4): 30 mg via ORAL
  Filled 2016-04-29 (×5): qty 1

## 2016-04-29 MED ORDER — HEPARIN BOLUS VIA INFUSION
1400.0000 [IU] | Freq: Once | INTRAVENOUS | Status: AC
  Administered 2016-04-29: 1400 [IU] via INTRAVENOUS
  Filled 2016-04-29: qty 1400

## 2016-04-29 MED ORDER — HEPARIN BOLUS VIA INFUSION
1400.0000 [IU] | Freq: Once | INTRAVENOUS | Status: AC
Start: 2016-04-29 — End: 2016-04-29
  Administered 2016-04-29: 1400 [IU] via INTRAVENOUS
  Filled 2016-04-29: qty 1400

## 2016-04-29 MED ORDER — PIPERACILLIN-TAZOBACTAM 3.375 G IVPB
3.3750 g | Freq: Two times a day (BID) | INTRAVENOUS | Status: DC
Start: 2016-04-29 — End: 2016-04-30
  Administered 2016-04-29 – 2016-04-30 (×2): 3.375 g via INTRAVENOUS
  Filled 2016-04-29 (×3): qty 50

## 2016-04-29 NOTE — Progress Notes (Signed)
.                                                                                                                                                                               Patient Demographics  [name], is a 55 y.o. male   MRN: [id number]   DOB - 12/17/1960  Admit Date - 04/25/2016    Outpatient Primary MD for the patient is PROVIDER NOT [place] requested in the Hospital by Flora Lipps, MD, On 04/29/2016     With History of -  Past Medical History  Diagnosis Date  . Chronic kidney disease   . Hypertension   . Diabetes mellitus without complication ([place])   . Hypercholesteremia   . Atherosclerosis   . Stroke ([place])   . Anemia       Past Surgical History  Procedure Laterality Date  . Av fistula placement Left 2015  . Peripheral vascular catheterization N/A 10/03/2015    Procedure: A/V Shuntogram/Fistulagram;  Surgeon: [name], MD;  Location: [place] [facility];  Service: Cardiovascular;  Laterality: N/A;  . Peripheral vascular catheterization N/A 01/16/2016    Procedure: Abdominal Aortogram w/Lower Extremity;  Surgeon: [name], MD;  Location: [facility];  Service: Cardiovascular;  Laterality: N/A;  . Peripheral vascular catheterization  01/16/2016    Procedure: Lower Extremity Intervention;  Surgeon: [name], MD;  Location: [place] [facility];  Service: Cardiovascular;;  . Leg amputation below knee Left   . Wound debridement Right 04/25/2016    Procedure: DEBRIDEMENT WOUND;  Surgeon: Katha Cabal, MD;  Location: [facility] ORS;  Service: Vascular;  Laterality: Right;  . Application of wound vac Right 04/25/2016    Procedure: APPLICATION OF WOUND VAC;  Surgeon: Katha Cabal, MD;  Location: [facility] ORS;  Service: Vascular;  Laterality: Right;  . Amputation Right 04/25/2016    Procedure: TOE AMPUTATION;  Surgeon: Katha Cabal, MD;  Location: [facility] ORS;  Service: Vascular;  Laterality: Right;    HPI  [name]  is a 55 y.o. male,  Admitted to hospital earlier this week because of gangrene to his toes. Underwent removal of the gangrenous digits by Dr. Ronalee Belts on Tuesday or Wednesday of this week this was followed by an angioplasty and improvement to his lower extremity arterial flow on the right side on Friday. At that point we felt like his best option for healing would be to go ahead with a transmetatarsal amputation to remove the obviously necrotic metatarsals that were protruding into the wound. So hopefully allow primary closure of the wound site. Unfortunately during the course of the surgery Saturday morning for the transmetatarsal amputation the patient  coded. He was resuscitated in the OR and stabilized and has been in CCU since that time frame. Once he was resuscitated and stabilized was able to go ahead and close up the wound. This occurred after the actual amputation had been achieved and closure was in process.     Social History Social History  Substance Use Topics  . Smoking status: Never Smoker   . Smokeless tobacco: Not on file  . Alcohol Use: No     Family History Family History  Problem Relation Age of Onset  . Prostate cancer Mother   . Diabetes Father      Prior to Admission medications   Medication Sig Start Date End Date Taking? Authorizing Provider  sevelamer carbonate (RENVELA) 800 MG tablet Take 800 mg by mouth 3 (three) times daily with meals.   Yes Historical Provider, MD  aspirin 325 MG tablet Take 325 mg by mouth daily.    Historical Provider, MD  cinacalcet (SENSIPAR) 30 MG tablet Take 60 mg by mouth daily.     Historical Provider, MD  glipiZIDE (GLUCOTROL) 10 MG tablet Take 10 mg by mouth daily before breakfast.    Historical Provider, MD  midodrine (PROAMATINE) 10 MG tablet Take 10 mg by mouth 3 (three) times daily.    Historical Provider, MD    Anti-infectives    Start     Dose/Rate Route Frequency Ordered Stop   04/28/16 0915  piperacillin-tazobactam (ZOSYN) IVPB 3.375 g      3.375 g 100 mL/hr over 30 Minutes Intravenous  Once 04/28/16 0944 04/28/16 0925   04/27/16 1733  cefUROXime (ZINACEF) 1.5 g in dextrose 5 % 50 mL IVPB  Status:  Discontinued     1.5 g 100 mL/hr over 30 Minutes Intravenous 30 min pre-op 04/27/16 1733 04/27/16 1834   04/27/16 0000  ceFAZolin (ANCEF) IVPB 1 g/50 mL premix    Comments:  Send with pt to OR   1 g 100 mL/hr over 30 Minutes Intravenous On call 04/26/16 1307 04/28/16 0000   04/26/16 1200  vancomycin (VANCOCIN) IVPB 1000 mg/200 mL premix  Status:  Discontinued     1,000 mg 200 mL/hr over 60 Minutes Intravenous Every T-Th-Sa (Hemodialysis) 04/25/16 1852 04/28/16 1102   04/25/16 1900  vancomycin (VANCOCIN) 2,000 mg in sodium chloride 0.9 % 500 mL IVPB     2,000 mg 250 mL/hr over 120 Minutes Intravenous  Once 04/25/16 1849 04/25/16 2215   04/25/16 1152  ceFAZolin (ANCEF) IVPB 2g/100 mL premix     2 g 200 mL/hr over 30 Minutes Intravenous On call to O.R. 04/25/16 1152 04/25/16 1223      Scheduled Meds: . antiseptic oral rinse  7 mL Mouth Rinse 10 times per day  . aspirin EC  81 mg Oral Daily  . atorvastatin  40 mg Oral q1800  . chlorhexidine gluconate (SAGE KIT)  15 mL Mouth Rinse BID  . famotidine (PEPCID) IV  20 mg Intravenous Q12H  . insulin aspart  0-5 Units Subcutaneous QHS  . insulin aspart  0-9 Units Subcutaneous TID WC  . metoprolol tartrate  25 mg Oral BID  . midodrine  10 mg Oral TID PC  . senna-docusate  2 tablet Oral BID  . sevelamer carbonate  0.8 g Oral TID WC  . sodium chloride flush  3 mL Intravenous Q12H   Continuous Infusions: . fentaNYL infusion INTRAVENOUS 25 mcg/hr (04/29/16 0700)  . heparin 1,550 Units/hr (04/29/16 0700)   PRN Meds:.sodium chloride, acetaminophen **OR** acetaminophen, fentaNYL,  HYDROcodone-acetaminophen, midazolam, midazolam, morphine injection, ondansetron **OR** ondansetron (ZOFRAN) IV, polyethylene glycol, sennosides, sodium chloride flush, sodium phosphate, sorbitol  No Known  Allergies  Physical Exam: The patient is actually awake and can communicate today. His wife is with him as well. He states he is in no pain at this juncture.  Vitals  Blood pressure 150/90, pulse 86, temperature 98.5 F (36.9 C), temperature source Axillary, resp. rate 11, height _0  (1.702 m), weight 114.4 kg (252 lb 3.3 oz), SpO2 100 %.  Lower Extremity exam:The patient bled through the bandage last night and had to have reinforcement which overall is a good sign. The bandage was removed today and incision margin looks clean and intact and the flaps dorsally and plantarly looked fairly healthy. No evidence of. Drainage or redness or irritation is noted. *  Data Review  CBC  Recent Labs Lab 04/23/16 1503  04/26/16 0459 04/27/16 0444 04/28/16 0355 04/28/16 1111 04/29/16 0618  WBC 10.9*  < > 10.0 9.9 9.8 15.5* 13.3*  HGB 8.0*  < > 7.5* 7.6* 7.9* 7.3* 8.0*  HCT 25.3*  < > 24.3* 24.3* 25.2* 23.9* 26.0*  PLT 308  < > 290 253 279 300 251  MCV 87.4  < > 89.0 88.6 87.9 87.3 89.0  MCH 27.6  < > 27.6 27.6 27.5 26.8 27.4  MCHC 31.6*  < > 31.0* 31.2* 31.2* 30.7* 30.8*  RDW 20.8*  < > 21.8* 21.2* 21.7* 22.5* 21.2*  LYMPHSABS 1.0  --   --   --   --   --   --   MONOABS 0.9  --   --   --   --   --   --   EOSABS 0.9*  --   --   --   --   --   --   BASOSABS 0.1  --   --   --   --   --   --   < > = values in this interval not displayed. ------------------------------------------------------------------------------------------------------------------  Chemistries   Recent Labs Lab 04/26/16 0459 04/27/16 0444 04/28/16 0355 04/28/16 1111 04/29/16 0618  NA 139 136 140 138 139  K 4.9 4.6 5.0 5.2* 5.6*  CL 97* 96* 99* 99* 100*  CO2 32 _1 GLUCOSE 178* 131* 162* 183* 128*  BUN 49* 36* 52* 57* 40*  CREATININE 7.05* 5.62* 7.16* 7.59* 6.33*  CALCIUM 8.3* 8.0* 8.5* 8.0* 8.8*  MG  --   --   --  2.4  --   AST  --   --   --  23  --   ALT  --   --   --  8*  --   ALKPHOS  --   --    --  245*  --   BILITOT  --   --   --  0.5  --    ------------------------------------------------------------------------------------------------------------------ estimated creatinine clearance is 15.7 mL/min (by C-G formula based on Cr of 6.33).  ---------------------------------------------------------------------------------  Cardiac Enzymes  Recent Labs Lab 04/28/16 1731 04/28/16 2330 04/29/16 0618  TROPONINI 0.07* 0.09* 0.08*   --------- ----Imaging results:   Ct Head Wo Contrast  04/28/2016  CLINICAL DATA:  55 year old male with a history of cardiac arrest EXAM: CT HEAD WITHOUT CONTRAST TECHNIQUE: Contiguous axial images were obtained from the base of the skull through the vertex without intravenous contrast. COMPARISON:  02/17/2007 FINDINGS: Unremarkable appearance of the calvarium without acute fracture or aggressive lesion. Unremarkable appearance of the scalp  soft tissues. Unremarkable appearance of the bilateral orbits. Enteric and orogastric tube incompletely imaged. Mastoid air cells are clear. No significant paranasal sinus disease No acute intracranial hemorrhage.  No midline shift or mass effect. Hypodensity in the periventricular white matter overlying the bilateral frontal horn of the lateral ventricles. Focal hypodensity in the left centrum semi ovale, new from the comparison. Gray-white differentiation relatively maintained. Extensive calcifications of the vasculature. IMPRESSION: No CT evidence of acute intracranial abnormality. Evidence of interval lacunar infarction/ischemic changes of the left centrum semiovale, new from the comparison CT of 2008. Intracranial atherosclerosis. Signed, Dulcy Fanny. Earleen Newport, DO [facility] Electronically Signed   By: Corrie Mckusick D.O.   On: 04/28/2016 14:16   Dg Chest Port 1 View  04/29/2016  CLINICAL DATA:  Evaluate for interval changes EXAM: PORTABLE CHEST 1 VIEW COMPARISON:   04/28/2016 FINDINGS: ET tube tip is above the carina. The nasogastric tube tip is below the GE junction. There is a right IJ catheter with tip projecting over the cavoatrial junction. The heart size appears enlarged. There is been interval clearing of left mid lung and left upper lobe opacities. No new findings. IMPRESSION: Interval clearing of left midlung and left upper lobe airspace opacities. Electronically Signed   By: Kerby Moors M.D.   On: 04/29/2016 08:19   Dg Chest Port 1 View  04/28/2016  CLINICAL DATA:  Acute respiratory failure. EXAM: PORTABLE CHEST 1 VIEW COMPARISON:  Radiographs of December 02, 2008. FINDINGS: Stable cardiomediastinal silhouette. Atherosclerosis of thoracic aorta is noted. Endotracheal tube projected over tracheal air shadow with distal tip 5 cm above the carina. Nasogastric tube is seen entering stomach. Right internal jugular catheter is noted with distal tip in expected position of SVC. No pneumothorax or pleural effusion is noted. Right upper lobe opacity is noted concerning for edema or inflammation. Left perihilar opacity is noted concerning for edema or inflammation. IMPRESSION: Endotracheal and nasogastric tubes in grossly good position. Bilateral lung opacities are noted concerning for edema or pneumonia. Electronically Signed   By: Marijo Conception, M.D.   On: 04/28/2016 11:38   Assessment & Plan: Bandages changed today the wound looks very clean no evidence of infection bleeding has slowed both the plantar and dorsal flap appeared to be fairly healthy at this juncture. The wound was able to be closed primarily. Plan: Application of wound VAC to the incision margin. I was going to do that yesterday but his coat interfered with that process. Overall decrease progressing very well with the foot and it looks healthy at this point.  Active Problems:   Atherosclerosis of extremity with gangrene [facility])   Cardiac arrest [facility])   Family Communication: Plan discussed with  patient and his wife   [name] M.D on 04/29/2016 at 9:53 AM  Thank you for the consult, we will follow the patient with you in the Hospital.

## 2016-04-29 NOTE — Progress Notes (Signed)
[facility] Progress Note Patient Name: VLADISLAV WETZELL DOB: 1961/05/07 MRN: [id number]   Date of Service  04/29/2016  HPI/Events of Note  PVCs, periodic ventricular arrhythmias (trigeminy); ESRD patient  eICU Interventions  Bmet, mg     Intervention Category Major Interventions: Arrhythmia - evaluation and management  Simonne Maffucci 04/29/2016, 11:59 PM

## 2016-04-29 NOTE — Progress Notes (Signed)
Extubated to 2L nasal cannula.  Alert and talking.  Strong congested cough.  Wife, interpretor and Dr. [name] Fries at bedside along with this RN.  RN made patient and wife aware that he will not be able to eat or drink for several hours because of tube being removed.  NSR per cardiac monitor. Continuing to monitor.

## 2016-04-29 NOTE — Progress Notes (Signed)
SUBJECTIVE:  The patient was extubated this morning. He feels better overall. He reports no chest pain or shortness of breath.   Filed Vitals:   04/29/16 0800 04/29/16 0900 04/29/16 1000 04/29/16 1100  BP: 162/95 150/90 160/92 154/91  Pulse: 84 86 87 85  Temp: 98.5 F (36.9 C)     TempSrc: Axillary     Resp: 13 11 14 14   Height:      Weight:      SpO2: 100% 100% 100% 100%    Intake/Output Summary (Last 24 hours) at 04/29/16 1221 Last data filed at 04/29/16 1200  Gross per 24 hour  Intake 634.55 ml  Output      0 ml  Net 634.55 ml    LABS: Basic Metabolic Panel:  Recent Labs  04/28/16 1111 04/29/16 0618  NA 138 139  K 5.2* 5.6*  CL 99* 100*  CO2 29 29  GLUCOSE 183* 128*  BUN 57* 40*  CREATININE 7.59* 6.33*  CALCIUM 8.0* 8.8*  MG 2.4  --   PHOS 6.4*  --    Liver Function Tests:  Recent Labs  04/28/16 1111  AST 23  ALT 8*  ALKPHOS 245*  BILITOT 0.5  PROT 6.2*  ALBUMIN 3.1*   No results for input(s): LIPASE, AMYLASE in the last 72 hours. CBC:  Recent Labs  04/28/16 1111 04/29/16 0618  WBC 15.5* 13.3*  HGB 7.3* 8.0*  HCT 23.9* 26.0*  MCV 87.3 89.0  PLT 300 251   Cardiac Enzymes:  Recent Labs  04/28/16 1731 04/28/16 2330 04/29/16 0618  TROPONINI 0.07* 0.09* 0.08*   BNP: Invalid input(s): POCBNP D-Dimer: No results for input(s): DDIMER in the last 72 hours. Hemoglobin A1C: No results for input(s): HGBA1C in the last 72 hours. Fasting Lipid Panel: No results for input(s): CHOL, HDL, LDLCALC, TRIG, CHOLHDL, LDLDIRECT in the last 72 hours. Thyroid Function Tests: No results for input(s): TSH, T4TOTAL, T3FREE, THYROIDAB in the last 72 hours.  Invalid input(s): FREET3 Anemia Panel: No results for input(s): VITAMINB12, FOLATE, FERRITIN, TIBC, IRON, RETICCTPCT in the last 72 hours.   PHYSICAL EXAM General: Well developed, well nourished, in no acute distress HEENT:  Normocephalic and atramatic Neck:  No JVD.  Lungs: Clear  bilaterally to auscultation and percussion. Heart: HRRR . Normal S1 and S2 without gallops or murmurs.  Abdomen: Bowel sounds are positive, abdomen soft and non-tender  Msk:  Back normal, normal gait. Normal strength and tone for age. Extremities: Left BKA. Femoral pulses are normal. Neuro: Alert and oriented X 3. Psych:  Good affect, responds appropriately  TELEMETRY: Reviewed telemetry pt in normal sinus rhythm with no evidence of arrhythmia.  ASSESSMENT AND PLAN:  1. Status post cardiac arrest due to polymorphic ventricular tachycardia during surgery yesterday: There is no evidence of myocardial infarction as his troponin remained flat. I personally reviewed his echocardiogram which showed mildly reduced LV systolic function with an ejection fraction of 40-45%. Wall motion could not be evaluated due to challenging image quality. Typically the most likely etiology for ventricular tachycardia is an ischemic cardiac events especially in somebody with established history of atherosclerosis and multiple risk factors. Thus, I have recommended proceeding with cardiac catheterization and possible coronary intervention to be done tomorrow. I discussed the procedure in details with the patient as well as with his family members with the help of an interpreter. I discussed risks and benefits.  2. Peripheral arterial disease with previous left BKA and status post right toe amputation for  gangrene. Followed by vascular surgery. No further procedures are planned.  3. End stage renal disease on hemodialysis: He is getting dialysis again today.   4. Anemia: Improved with blood transfusion. No evidence of active bleeding.   Kathlyn Sacramento, MD, [facility] 04/29/2016 12:21 PM

## 2016-04-29 NOTE — Progress Notes (Signed)
ANTICOAGULATION CONSULT NOTE - Initial Consult  Pharmacy Consult for Heparin  Indication: chest pain/ACS  No Known Allergies  Patient Measurements: Height: 5\' 7"  (170.2 cm) Weight: 252 lb 3.3 oz (114.4 kg) IBW/kg (Calculated) : 66.1 Heparin Dosing Weight: 91.7 kg   Vital Signs: Temp: 98.5 F (36.9 C) (07/16 0800) Temp Source: Axillary (07/16 0800) BP: 154/91 mmHg (07/16 1100) Pulse Rate: 85 (07/16 1100)  Labs:  Recent Labs  04/27/16 2330 04/28/16 0355  04/28/16 1111 04/28/16 1731 04/28/16 2330 04/29/16 0618 04/29/16 1131  HGB  --  7.9*  --  7.3*  --   --  8.0*  --   HCT  --  25.2*  --  23.9*  --   --  26.0*  --   PLT  --  279  --  300  --   --  251  --   HEPARINUNFRC 0.10*  --   --   --   --   --   --  0.22*  CREATININE  --  7.16*  --  7.59*  --   --  6.33*  --   TROPONINI  --   --   < > 0.03* 0.07* 0.09* 0.08*  --   < > = values in this interval not displayed.  Estimated Creatinine Clearance: 15.7 mL/min (by C-G formula based on Cr of 6.33).   Medical History: Past Medical History  Diagnosis Date  . Chronic kidney disease   . Hypertension   . Diabetes mellitus without complication ([place])   . Hypercholesteremia   . Atherosclerosis   . Stroke ([place])   . Anemia     Medications:  Prescriptions prior to admission  Medication Sig Dispense Refill Last Dose  . sevelamer carbonate (RENVELA) 800 MG tablet Take 800 mg by mouth 3 (three) times daily with meals.   04/24/2016 at Unknown time  . aspirin 325 MG tablet Take 325 mg by mouth daily.   04/23/2016  . cinacalcet (SENSIPAR) 30 MG tablet Take 60 mg by mouth daily.    04/23/2016  . glipiZIDE (GLUCOTROL) 10 MG tablet Take 10 mg by mouth daily before breakfast.   04/23/2016  . midodrine (PROAMATINE) 10 MG tablet Take 10 mg by mouth 3 (three) times daily.   04/24/2016    Assessment: Pharmacy consulted to dose heparin in this 55 year old male with ACS.  Pt was previously on heparin 5000 units SQ Q8H, last dose on 7/15 @  0500.     Goal of Therapy:  Heparin level 0.3-0.7 units/ml Monitor platelets by anticoagulation protocol: Yes   Plan:  HL= 0.22. Drip not paused per RN. Will bolus heparin 1400 units iv once and increase infusion to 1750 units/hr. Will recheck a HL in 8 hours.   Ulice Dash D 04/29/2016,12:22 PM

## 2016-04-29 NOTE — Progress Notes (Signed)
ANTICOAGULATION CONSULT NOTE - Initial Consult  Pharmacy Consult for Heparin  Indication: chest pain/ACS  No Known Allergies  Patient Measurements: Height: 5\' 7"  (170.2 cm) Weight: 253 lb 9.6 oz (115.032 kg) IBW/kg (Calculated) : 66.1 Heparin Dosing Weight: 91.7 kg   Vital Signs: Temp: 98.2 F (36.8 C) (07/16 0000) Temp Source: Axillary (07/16 0000) BP: 127/75 mmHg (07/16 0000) Pulse Rate: 75 (07/16 0000)  Labs:  Recent Labs  04/27/16 0444 04/27/16 2330 04/28/16 0355 04/28/16 1111 04/28/16 1731 04/28/16 2330  HGB 7.6*  --  7.9* 7.3*  --   --   HCT 24.3*  --  25.2* 23.9*  --   --   PLT 253  --  279 300  --   --   HEPARINUNFRC  --  0.10*  --   --   --   --   CREATININE 5.62*  --  7.16* 7.59*  --   --   TROPONINI  --   --   --  0.03* 0.07* 0.09*    Estimated Creatinine Clearance: 13.2 mL/min (by C-G formula based on Cr of 7.59).   Medical History: Past Medical History  Diagnosis Date  . Chronic kidney disease   . Hypertension   . Diabetes mellitus without complication ([place])   . Hypercholesteremia   . Atherosclerosis   . Stroke ([place])   . Anemia     Medications:  Prescriptions prior to admission  Medication Sig Dispense Refill Last Dose  . sevelamer carbonate (RENVELA) 800 MG tablet Take 800 mg by mouth 3 (three) times daily with meals.   04/24/2016 at Unknown time  . aspirin 325 MG tablet Take 325 mg by mouth daily.   04/23/2016  . cinacalcet (SENSIPAR) 30 MG tablet Take 60 mg by mouth daily.    04/23/2016  . glipiZIDE (GLUCOTROL) 10 MG tablet Take 10 mg by mouth daily before breakfast.   04/23/2016  . midodrine (PROAMATINE) 10 MG tablet Take 10 mg by mouth 3 (three) times daily.   04/24/2016    Assessment: Pharmacy consulted to dose heparin in this 55 year old male with ACS.  Pt was previously on heparin 5000 units SQ Q8H, last dose on 7/15 @ 0500.     Goal of Therapy:  Heparin level 0.3-0.7 units/ml Monitor platelets by anticoagulation protocol: Yes    Plan:  Give 4000 units bolus x 1 Start heparin infusion at 1200 units/hr  Will draw 1st HL 6 hrs after start of drip.   7/16 00:00 heparin level 0.10. Per lab level appears to have been reported for 7/14 @ 23:30 but was actually drawn 7/15 @ 23:30.   2800 unit bolus and increase rate to 1550 units/hr. Recheck heparin level in 8 hours.  Dorreen Valiente S 04/29/2016,1:53 AM

## 2016-04-29 NOTE — Progress Notes (Signed)
HD STARTED  

## 2016-04-29 NOTE — Progress Notes (Signed)
RN spoke with Dr. [name] Fries and made MD aware that patient passed nursing bedside swallowing evaluation.  MD gave diet order.

## 2016-04-29 NOTE — Progress Notes (Signed)
Pharmacy Note  Dialysis pt ordered famotidine 20 mg IV q12h. Will renally adjust to famotidine 20 mg IV q24h.

## 2016-04-29 NOTE — Progress Notes (Signed)
A&Ox4.  Tolerated dialysis well.  NSR per cardiac monitor with PVCs.  VSS.  No respiratory distress.  Plan for cardiac cath tomorrow.  Family visiting, wife at bedside at this time.  Report given to Quita Skye, RN who is now taking over patient's care.

## 2016-04-29 NOTE — Progress Notes (Signed)
POST DIALYSIS ASSESSMENT 

## 2016-04-29 NOTE — Consult Note (Signed)
PULMONARY / CRITICAL CARE MEDICINE   Name: [name] MRN: [id number] DOB: 25-Dec-1960    ADMISSION DATE:  04/25/2016 CONSULTATION DATE:  04/28/2016  REFERRING MD:  Dr. Tressia Miners  CHIEF COMPLAINT:  Atherosclerotic occlusive disease bilateral lower extremities with gangrene of the right forefoot  HISTORY OF PRESENT ILLNESS:   Remains intubated, wean off sedation and assess of SAT/SBT   REVIEW OF SYSTEMS:   Unable to assess pt intubated and sedated.  VITAL SIGNS: BP 162/95 mmHg  Pulse 84  Temp(Src) 98.8 F (37.1 C) (Axillary)  Resp 13  Ht 5\' 7"  (1.702 m)  Wt 252 lb 3.3 oz (114.4 kg)  BMI 39.49 kg/m2  SpO2 100%     VENTILATOR SETTINGS: Vent Mode:  [-] PRVC FiO2 (%):  [40 %-50 %] 40 % Set Rate:  [15 bmp] 15 bmp Vt Set:  [500 mL] 500 mL PEEP:  [5 cmH20] 5 cmH20 Plateau Pressure:  [19 cmH20-24 cmH20] 24 cmH20  INTAKE / OUTPUT: I/O last 3 completed shifts: In: 1007.1 [I.V.:847.1; Other:100; NG/GT:60] Out: 11 [Stool:1; Blood:10]  PHYSICAL EXAMINATION: General:  Critically ill appearing male Neuro:  Sedated, withdraws from painful stimulation,  follow commands,  HEENT:  Supple, no JVD Cardiovascular:  Sinus tach, s1s2, PVC's, no M/R/G Lungs:  course throughout even, non labored, mechanically ventilated Abdomen:  Obese, hypoactive BS x4, soft, non tender, non distended Musculoskeletal:  Left BKA, right foot transmetatarsal amputation Skin:  Right lower extremity incision dressing dry and intact   LABS:  BMET  Recent Labs Lab 04/28/16 0355 04/28/16 1111 04/29/16 0618  NA 140 138 139  K 5.0 5.2* 5.6*  CL 99* 99* 100*  CO2 30 29 29   BUN 52* 57* 40*  CREATININE 7.16* 7.59* 6.33*  GLUCOSE 162* 183* 128*    Electrolytes  Recent Labs Lab 04/28/16 0355 04/28/16 1111 04/29/16 0618  CALCIUM 8.5* 8.0* 8.8*  MG  --  2.4  --   PHOS  --  6.4*  --     CBC  Recent Labs Lab 04/28/16 0355 04/28/16 1111 04/29/16 0618  WBC 9.8 15.5* 13.3*  HGB 7.9* 7.3*  8.0*  HCT 25.2* 23.9* 26.0*  PLT 279 300 251    Coag's  Recent Labs Lab 04/23/16 1503  APTT 26  INR 1.31    Sepsis Markers  Recent Labs Lab 04/28/16 1111 04/29/16 0618  LATICACIDVEN 0.9  --   PROCALCITON 1.80 3.15    ABG  Recent Labs Lab 04/28/16 1227 04/29/16 0500  PHART 7.38 7.35  PCO2ART 48 59*  PO2ART 87 40*    Liver Enzymes  Recent Labs Lab 04/28/16 1111  AST 23  ALT 8*  ALKPHOS 245*  BILITOT 0.5  ALBUMIN 3.1*    Cardiac Enzymes  Recent Labs Lab 04/28/16 1731 04/28/16 2330 04/29/16 0618  TROPONINI 0.07* 0.09* 0.08*    Glucose  Recent Labs Lab 04/27/16 2120 04/28/16 0715 04/28/16 1117 04/28/16 1629 04/28/16 2147 04/29/16 0715  GLUCAP 185* 135* 194* 125* 89 126*    Imaging Ct Head Wo Contrast  04/28/2016  CLINICAL DATA:  55 year old male with a history of cardiac arrest EXAM: CT HEAD WITHOUT CONTRAST TECHNIQUE: Contiguous axial images were obtained from the base of the skull through the vertex without intravenous contrast. COMPARISON:  02/17/2007 FINDINGS: Unremarkable appearance of the calvarium without acute fracture or aggressive lesion. Unremarkable appearance of the scalp soft tissues. Unremarkable appearance of the bilateral orbits. Enteric and orogastric tube incompletely imaged. Mastoid air cells are clear. No significant paranasal  sinus disease No acute intracranial hemorrhage.  No midline shift or mass effect. Hypodensity in the periventricular white matter overlying the bilateral frontal horn of the lateral ventricles. Focal hypodensity in the left centrum semi ovale, new from the comparison. Gray-white differentiation relatively maintained. Extensive calcifications of the vasculature. IMPRESSION: No CT evidence of acute intracranial abnormality. Evidence of interval lacunar infarction/ischemic changes of the left centrum semiovale, new from the comparison CT of 2008. Intracranial atherosclerosis. Signed, Dulcy Fanny. Earleen Newport, DO  [facility] Radiology Electronically Signed   By: Corrie Mckusick D.O.   On: 04/28/2016 14:16   Dg Chest Port 1 View  04/29/2016  CLINICAL DATA:  Evaluate for interval changes EXAM: PORTABLE CHEST 1 VIEW COMPARISON:  04/28/2016 FINDINGS: ET tube tip is above the carina. The nasogastric tube tip is below the GE junction. There is a right IJ catheter with tip projecting over the cavoatrial junction. The heart size appears enlarged. There is been interval clearing of left mid lung and left upper lobe opacities. No new findings. IMPRESSION: Interval clearing of left midlung and left upper lobe airspace opacities. Electronically Signed   By: Kerby Moors M.D.   On: 04/29/2016 08:19   Dg Chest Port 1 View  04/28/2016  CLINICAL DATA:  Acute respiratory failure. EXAM: PORTABLE CHEST 1 VIEW COMPARISON:  Radiographs of December 02, 2008. FINDINGS: Stable cardiomediastinal silhouette. Atherosclerosis of thoracic aorta is noted. Endotracheal tube projected over tracheal air shadow with distal tip 5 cm above the carina. Nasogastric tube is seen entering stomach. Right internal jugular catheter is noted with distal tip in expected position of SVC. No pneumothorax or pleural effusion is noted. Right upper lobe opacity is noted concerning for edema or inflammation. Left perihilar opacity is noted concerning for edema or inflammation. IMPRESSION: Endotracheal and nasogastric tubes in grossly good position. Bilateral lung opacities are noted concerning for edema or pneumonia. Electronically Signed   By: Marijo Conception, M.D.   On: 04/28/2016 11:38     STUDIES:  None  CULTURES: Aerobic/Anaerobic Culture of right foot 7/15>>  ANTIBIOTICS: Zosyn 7/15>> Vancomycin 7/12>>7/15  SIGNIFICANT EVENTS: -7/12 pt admitted [facility] for amputation of the right second, third, and fourth toes through the MP joint and excisional debridement of necrotic tissue including skin and  subcutaneous tissues, tendon and bone first and fifth ray secondary to atherosclerotic occlusive disease with wound vac placement -7/14 right lower extremity angiogram performed -7/15 pt taken to OR for right tansmetatarsal amputation during surgery pt cardiac arrested -7/15 PCCM consulted post cardiac arrest and mechanical vent management   LINES/TUBES: PIV x1 ETT 7/15>>7/16 R internal jugular CVL 7/15>> Left femoral arterial line 7/15>>7/15 OG tube 7/15>>7/16  DISCUSSION: This is a 55 yo male who presented to [facility] on 7/12 for amputation of the right second, third, and fourth toes through the MP joint and excisional debridement of necrotic tissue including skin and subcutaneous tissues, tendon and bone first and fifth ray secondary to atherosclerotic occlusive disease with wound vac placement. The pt had an angiogram of the right foot on [id number] without complication. On 7/15 the pt was taken to the OR for a transmetatarsal amputation and cardiac arrested during surgery   ASSESSMENT / PLAN:   PULMONARY A: Mechanical ventilation post cardiac arrest -plan for SAT/SBT P:   Full vent support Pt followed commands post cardiac arrest therefore no hypothermic protocol Wife at bedside-updated  CARDIOVASCULAR A:  Cardiac arrest due to polymorphic ventricular tachycardia during surgery most likely etiology  is an ischemic cardiac event  Hx: HTN, Atherosclerosis, Stroke P:  Trend troponin's Echo pending  Cardiology consulted appreciate input Heparin drip per Cardiology if CT of head negative pharmacy to dose Telemetry monitoring  RENAL A:   Hx: Chronic Kidney Diseae P:   Nephrology consulted appreciate input Follow BMP's Replace electrolytes as indicated Hemodialysis today 7/15, may need HD today  GASTROINTESTINAL A:   No acute issues    HEMATOLOGIC A:   Anemia P:  Trend CBC Heparin drip for VTE prophylaxis Monitor for s/sx of bleeding Transfuse 1 unit pRBC today  7/15  INFECTIOUS A:   Right foot necrotic toes- status post excisional debridement of second, third, and fourth toes on 04/25/2016; status post right foot transmetatarsal amputation 04/28/2016 Leukocytosis  P:   Discontinue vancomycin, continue zosyn 7/15 Trend WBC and monitor fever curve Trend PCT's and monitor lactic acid   ENDOCRINE A:   Hx: DM P:   CBG's q4hrs SSI    The Patient requires high complexity decision making for assessment and support, frequent evaluation and titration of therapies, application of advanced monitoring technologies and extensive interpretation of multiple databases. Critical Care Time devoted to patient care services described in this note is 35 minutes.   Overall, patient is critically ill, prognosis is guarded.  Patient with Multiorgan failure and at high risk for cardiac arrest and death.    Corrin Parker, M.D.  [facility]  Medical Director [place] Director [facility] Cardio-Pulmonary Department

## 2016-04-29 NOTE — Progress Notes (Signed)
Spoke w/ Elta Guadeloupe, RN @ [facility] about pt.'s hyperkalemia, RN will leave message for MD.

## 2016-04-29 NOTE — Progress Notes (Signed)
Patient extubated per verbal order from Dr. [name] Fries.  Pt. Suctioned prior to extubation and placed on 2LPM Lane post extubation without complication.  Will continue to monitor.

## 2016-04-29 NOTE — Progress Notes (Signed)
PRE DIALYSIS ASSESSMENT 

## 2016-04-29 NOTE — Progress Notes (Signed)
ANTICOAGULATION CONSULT NOTE - Initial Consult  Pharmacy Consult for Heparin  Indication: chest pain/ACS  No Known Allergies  Patient Measurements: Height: 5\' 7"  (170.2 cm) Weight: 253 lb 1.4 oz (114.8 kg) IBW/kg (Calculated) : 66.1 Heparin Dosing Weight: 91.7 kg   Vital Signs: Temp: 98.1 F (36.7 C) (07/16 1930) Temp Source: Oral (07/16 1930) BP: 142/83 mmHg (07/16 2000) Pulse Rate: 56 (07/16 2000)  Labs:  Recent Labs  04/27/16 2330 04/28/16 0355 04/28/16 1111  04/29/16 0618 04/29/16 1131 04/29/16 1756 04/29/16 1940  HGB  --  7.9* 7.3*  --  8.0*  --   --   --   HCT  --  25.2* 23.9*  --  26.0*  --   --   --   PLT  --  279 300  --  251  --   --   --   HEPARINUNFRC 0.10*  --   --   --   --  0.22*  --  0.25*  CREATININE  --  7.16* 7.59*  --  6.33*  --   --   --   TROPONINI  --   --  0.03*  < > 0.08* 0.06* 0.07*  --   < > = values in this interval not displayed.  Estimated Creatinine Clearance: 15.8 mL/min (by C-G formula based on Cr of 6.33).   Medical History: Past Medical History  Diagnosis Date  . Chronic kidney disease   . Hypertension   . Diabetes mellitus without complication ([place])   . Hypercholesteremia   . Atherosclerosis   . Stroke ([place])   . Anemia     Medications:  Prescriptions prior to admission  Medication Sig Dispense Refill Last Dose  . sevelamer carbonate (RENVELA) 800 MG tablet Take 800 mg by mouth 3 (three) times daily with meals.   04/24/2016 at Unknown time  . aspirin 325 MG tablet Take 325 mg by mouth daily.   04/23/2016  . cinacalcet (SENSIPAR) 30 MG tablet Take 60 mg by mouth daily.    04/23/2016  . glipiZIDE (GLUCOTROL) 10 MG tablet Take 10 mg by mouth daily before breakfast.   04/23/2016  . midodrine (PROAMATINE) 10 MG tablet Take 10 mg by mouth 3 (three) times daily.   04/24/2016    Assessment: Pharmacy consulted to dose heparin in this 55 year old male with ACS.  Pt was previously on heparin 5000 units SQ Q8H, last dose on 7/15 @  0500.     Goal of Therapy:  Heparin level 0.3-0.7 units/ml Monitor platelets by anticoagulation protocol: Yes   Plan:  HL= 0.22. Drip not paused per RN. Will bolus heparin 1400 units iv once and increase infusion to 1750 units/hr. Will recheck a HL in 8 hours.   7/16:  HL @ 20:30 = 0.25 Will order Heparin 1400 units IV X 1 bolus and increase drip rate to 1950 units/hr.   Will recheck HL 8 hrs after rate change .   Alvetta Hidrogo D 04/29/2016,9:05 PM

## 2016-04-29 NOTE — Progress Notes (Signed)
[facility]  ROUNDING NOTE   Subjective:   Dialysis yesterday. Tolerated treatment well. UF 0. 2K bath.  Patient extubated this morning.  Potassium of 5.6  Objective:  Vital signs in last 24 hours:  Temp:  [98.1 F (36.7 C)-98.8 F (37.1 C)] 98.5 F (36.9 C) (07/16 0800) Pulse Rate:  [72-116] 87 (07/16 1000) Resp:  [11-29] 14 (07/16 1000) BP: (117-183)/(71-137) 160/92 mmHg (07/16 1000) SpO2:  [100 %] 100 % (07/16 1000) Arterial Line BP: (150-190)/(67-171) 163/157 mmHg (07/15 2000) FiO2 (%):  [40 %-50 %] 40 % (07/16 0753) Weight:  [114.4 kg (252 lb 3.3 oz)] 114.4 kg (252 lb 3.3 oz) (07/16 0500)  Weight change: -2.22 kg (-4 lb 14.3 oz) Filed Weights   04/28/16 0030 04/28/16 0518 04/29/16 0500  Weight: 116.62 kg (257 lb 1.6 oz) 115.032 kg (253 lb 9.6 oz) 114.4 kg (252 lb 3.3 oz)    Intake/Output: I/O last 3 completed shifts: In: 1007.1 [I.V.:847.1; Other:100; NG/GT:60] Out: 11 [Stool:1; Blood:10]   Intake/Output this shift:  Total I/O In: 96.5 [I.V.:46.5; IV Piggyback:50] Out: -   Physical Exam: General: Laying in bed  Head: Normocephalic, atraumatic. Moist oral mucosal membranes  Eyes: Anicteric, PERRL  Neck: Supple, trachea midline  Lungs:  Clear bilaterally  Heart: Regular rate and rhythm  Abdomen:  Soft, nontender, obese  Extremities: Left BKA, right foot in wound vac  Neurologic: nonfocal  Skin: No lesions  Access: Left arm AVF    Basic Metabolic Panel:  Recent Labs Lab 04/26/16 0459 04/27/16 0444 04/28/16 0355 04/28/16 1111 04/29/16 0618  NA 139 136 140 138 139  K 4.9 4.6 5.0 5.2* 5.6*  CL 97* 96* 99* 99* 100*  CO2 32 31 30 29 29   GLUCOSE 178* 131* 162* 183* 128*  BUN 49* 36* 52* 57* 40*  CREATININE 7.05* 5.62* 7.16* 7.59* 6.33*  CALCIUM 8.3* 8.0* 8.5* 8.0* 8.8*  MG  --   --   --  2.4  --   PHOS  --   --   --  6.4*  --     Liver Function Tests:  Recent Labs Lab 04/28/16 1111  AST 23  ALT 8*  ALKPHOS 245*  BILITOT 0.5   PROT 6.2*  ALBUMIN 3.1*   No results for input(s): LIPASE, AMYLASE in the last 168 hours. No results for input(s): AMMONIA in the last 168 hours.  CBC:  Recent Labs Lab 04/23/16 1503  04/26/16 0459 04/27/16 0444 04/28/16 0355 04/28/16 1111 04/29/16 0618  WBC 10.9*  < > 10.0 9.9 9.8 15.5* 13.3*  NEUTROABS 7.9*  --   --   --   --   --   --   HGB 8.0*  < > 7.5* 7.6* 7.9* 7.3* 8.0*  HCT 25.3*  < > 24.3* 24.3* 25.2* 23.9* 26.0*  MCV 87.4  < > 89.0 88.6 87.9 87.3 89.0  PLT 308  < > 290 253 279 300 251  < > = values in this interval not displayed.  Cardiac Enzymes:  Recent Labs Lab 04/28/16 1111 04/28/16 1731 04/28/16 2330 04/29/16 0618  TROPONINI 0.03* 0.07* 0.09* 0.08*    BNP: Invalid input(s): POCBNP  CBG:  Recent Labs Lab 04/28/16 0715 04/28/16 1117 04/28/16 1629 04/28/16 2147 04/29/16 0715  GLUCAP 135* 194* 125* 78 126*    Microbiology: Results for orders placed or performed during the hospital encounter of 04/25/16  Aerobic/Anaerobic Culture (surgical/deep wound)     Status: None (Preliminary result)   Collection Time: 04/28/16  9:02 AM  Result Value Ref Range Status   Specimen Description WOUND RIGHT FOOT  Final   Special Requests POF VANCOMYCIN AND ZINACEF  Final   Gram Stain   Final    RARE GRAM VARIABLE ROD Performed at [facility]    Culture PENDING  Incomplete   Report Status PENDING  Incomplete  MRSA PCR Screening     Status: None   Collection Time: 04/28/16 10:06 AM  Result Value Ref Range Status   MRSA by PCR NEGATIVE NEGATIVE Final    Comment:        The GeneXpert MRSA Assay (FDA approved for NASAL specimens only), is one component of a comprehensive MRSA colonization surveillance program. It is not intended to diagnose MRSA infection nor to guide or monitor treatment for MRSA infections.     Coagulation Studies: No results for input(s): LABPROT, INR in the last 72 hours.  Urinalysis: No results for input(s):  COLORURINE, LABSPEC, PHURINE, GLUCOSEU, HGBUR, BILIRUBINUR, KETONESUR, PROTEINUR, UROBILINOGEN, NITRITE, LEUKOCYTESUR in the last 72 hours.  Invalid input(s): APPERANCEUR    Imaging: Ct Head Wo Contrast  04/28/2016  CLINICAL DATA:  55 year old male with a history of cardiac arrest EXAM: CT HEAD WITHOUT CONTRAST TECHNIQUE: Contiguous axial images were obtained from the base of the skull through the vertex without intravenous contrast. COMPARISON:  02/17/2007 FINDINGS: Unremarkable appearance of the calvarium without acute fracture or aggressive lesion. Unremarkable appearance of the scalp soft tissues. Unremarkable appearance of the bilateral orbits. Enteric and orogastric tube incompletely imaged. Mastoid air cells are clear. No significant paranasal sinus disease No acute intracranial hemorrhage.  No midline shift or mass effect. Hypodensity in the periventricular white matter overlying the bilateral frontal horn of the lateral ventricles. Focal hypodensity in the left centrum semi ovale, new from the comparison. Gray-white differentiation relatively maintained. Extensive calcifications of the vasculature. IMPRESSION: No CT evidence of acute intracranial abnormality. Evidence of interval lacunar infarction/ischemic changes of the left centrum semiovale, new from the comparison CT of 2008. Intracranial atherosclerosis. Signed, Dulcy Fanny. Earleen Newport, DO [facility] Electronically Signed   By: Corrie Mckusick D.O.   On: 04/28/2016 14:16   Dg Chest Port 1 View  04/29/2016  CLINICAL DATA:  Evaluate for interval changes EXAM: PORTABLE CHEST 1 VIEW COMPARISON:  04/28/2016 FINDINGS: ET tube tip is above the carina. The nasogastric tube tip is below the GE junction. There is a right IJ catheter with tip projecting over the cavoatrial junction. The heart size appears enlarged. There is been interval clearing of left mid lung and left upper lobe opacities. No new  findings. IMPRESSION: Interval clearing of left midlung and left upper lobe airspace opacities. Electronically Signed   By: Kerby Moors M.D.   On: 04/29/2016 08:19   Dg Chest Port 1 View  04/28/2016  CLINICAL DATA:  Acute respiratory failure. EXAM: PORTABLE CHEST 1 VIEW COMPARISON:  Radiographs of December 02, 2008. FINDINGS: Stable cardiomediastinal silhouette. Atherosclerosis of thoracic aorta is noted. Endotracheal tube projected over tracheal air shadow with distal tip 5 cm above the carina. Nasogastric tube is seen entering stomach. Right internal jugular catheter is noted with distal tip in expected position of SVC. No pneumothorax or pleural effusion is noted. Right upper lobe opacity is noted concerning for edema or inflammation. Left perihilar opacity is noted concerning for edema or inflammation. IMPRESSION: Endotracheal and nasogastric tubes in grossly good position. Bilateral lung opacities are noted concerning for edema or pneumonia. Electronically Signed  By: Marijo Conception, M.D.   On: 04/28/2016 11:38     Medications:   . fentaNYL infusion INTRAVENOUS 25 mcg/hr (04/29/16 0700)  . heparin 1,550 Units/hr (04/29/16 0700)   . antiseptic oral rinse  7 mL Mouth Rinse 10 times per day  . aspirin EC  81 mg Oral Daily  . atorvastatin  40 mg Oral q1800  . chlorhexidine gluconate (SAGE KIT)  15 mL Mouth Rinse BID  . famotidine (PEPCID) IV  20 mg Intravenous Q12H  . insulin aspart  0-5 Units Subcutaneous QHS  . insulin aspart  0-9 Units Subcutaneous TID WC  . metoprolol tartrate  25 mg Oral BID  . midodrine  10 mg Oral TID PC  . senna-docusate  2 tablet Oral BID  . sevelamer carbonate  0.8 g Oral TID WC  . sodium chloride flush  3 mL Intravenous Q12H   sodium chloride, acetaminophen **OR** acetaminophen, fentaNYL, HYDROcodone-acetaminophen, midazolam, midazolam, morphine injection, ondansetron **OR** ondansetron (ZOFRAN) IV, polyethylene glycol, sennosides, sodium chloride flush,  sodium phosphate, sorbitol  Assessment/ Plan:  [name] is a 55 y.o. Hispanic male with End Stage Renal Disease on hemodialysis since 2008, diabetes mellitus type II, CVA, PVD, status post left BKA, hyperlipidemia, who was admitted to [facility] on 04/25/2016 with peripheral vascular disease  TTS [facility] [place].   1. End Stage Renal Disease: hemodialysis treatment yesterday after cardiac arrest. Unfortunately potassium still elevated.  - Schedule another dialysis treatment for later today. Then resume TTS schedule.   2. Hypotension: blood pressure at goal.  - midodrine usually before treatment but now with hypertension.  3. Diabetes mellitus type II with chronic kidney disease - Continue glucose control.   4. Secondary Hyperparathyroidism:  - restart cinacalcet and binders.   5. Anemia of chronic kidney disease: - epo with treatment TTS   LOS: 4 Argel Pablo 7/16/201710:50 AM

## 2016-04-29 NOTE — Anesthesia Postprocedure Evaluation (Signed)
Anesthesia Post Note  Patient: [name]  Procedure(s) Performed: Procedure(s) (LRB): TRANSMETATARSAL AMPUTATION (Right)  Patient location during evaluation: SICU Anesthesia Type: General Level of consciousness: awake Pain management: pain level controlled Vital Signs Assessment: post-procedure vital signs reviewed and stable Respiratory status: patient remains intubated per anesthesia plan Cardiovascular status: stable Comments: Per report, patient had cardiac arrest in OR.  Please refer to anesthesia note for more information    Last Vitals:  Filed Vitals:   04/29/16 0700 04/29/16 0800  BP: 144/86 162/95  Pulse: 81 84  Temp:    Resp: 15 13    Last Pain:  Filed Vitals:   04/29/16 0807  PainSc: 0-No pain                 Precious Haws Muranda Coye

## 2016-04-30 ENCOUNTER — Encounter: Payer: Self-pay | Admitting: Vascular Surgery

## 2016-04-30 ENCOUNTER — Encounter
Admission: RE | Disposition: A | Discharge status: Skilled Nursing Facility | Payer: Self-pay | Source: Ambulatory Visit | Attending: Internal Medicine

## 2016-04-30 DIAGNOSIS — J9600 Acute respiratory failure, unspecified whether with hypoxia or hypercapnia: Secondary | ICD-10-CM

## 2016-04-30 DIAGNOSIS — I2510 Atherosclerotic heart disease of native coronary artery without angina pectoris: Secondary | ICD-10-CM

## 2016-04-30 HISTORY — PX: CARDIAC CATHETERIZATION: SHX172

## 2016-04-30 LAB — BASIC METABOLIC PANEL
Anion gap: 9 (ref 5–15)
BUN: 30 mg/dL — ABNORMAL HIGH (ref 6–20)
CHLORIDE: 98 mmol/L — AB (ref 101–111)
CO2: 32 mmol/L (ref 22–32)
Calcium: 7.9 mg/dL — ABNORMAL LOW (ref 8.9–10.3)
Creatinine, Ser: 4.58 mg/dL — ABNORMAL HIGH (ref 0.61–1.24)
GFR calc Af Amer: 15 mL/min — ABNORMAL LOW (ref >60)
GFR, EST NON AFRICAN AMERICAN: 13 mL/min — AB (ref >60)
GLUCOSE: 145 mg/dL — AB (ref 65–99)
POTASSIUM: 4.4 mmol/L (ref 3.5–5.1)
Sodium: 139 mmol/L (ref 135–145)

## 2016-04-30 LAB — HEPARIN LEVEL (UNFRACTIONATED): HEPARIN UNFRACTIONATED: 0.31 [IU]/mL (ref 0.30–0.70)

## 2016-04-30 LAB — PROCALCITONIN: PROCALCITONIN: 3.53 ng/mL

## 2016-04-30 LAB — CBC
HCT: 23.5 % — ABNORMAL LOW (ref 40.0–52.0)
HEMOGLOBIN: 7.4 g/dL — AB (ref 13.0–18.0)
MCH: 28.1 pg (ref 26.0–34.0)
MCHC: 31.4 g/dL — AB (ref 32.0–36.0)
MCV: 89.5 fL (ref 80.0–100.0)
Platelets: 210 10*3/uL (ref 150–440)
RBC: 2.62 MIL/uL — ABNORMAL LOW (ref 4.40–5.90)
RDW: 21.1 % — AB (ref 11.5–14.5)
WBC: 9.6 10*3/uL (ref 3.8–10.6)

## 2016-04-30 LAB — GLUCOSE, CAPILLARY
GLUCOSE-CAPILLARY: 138 mg/dL — AB (ref 65–99)
GLUCOSE-CAPILLARY: 127 mg/dL — AB (ref 65–99)
Glucose-Capillary: 171 mg/dL — ABNORMAL HIGH (ref 65–99)
Glucose-Capillary: 108 mg/dL — ABNORMAL HIGH (ref 65–99)

## 2016-04-30 LAB — PROTIME-INR
INR: 1.47
Prothrombin Time: 17.9 seconds — ABNORMAL HIGH (ref 11.4–15.0)

## 2016-04-30 LAB — MAGNESIUM: MAGNESIUM: 2.2 mg/dL (ref 1.7–2.4)

## 2016-04-30 SURGERY — LEFT HEART CATH AND CORONARY ANGIOGRAPHY
Anesthesia: Moderate Sedation

## 2016-04-30 MED ORDER — FENTANYL CITRATE (PF) 100 MCG/2ML IJ SOLN
INTRAMUSCULAR | Status: DC | PRN
  Administered 2016-04-30: 25 ug via INTRAVENOUS

## 2016-04-30 MED ORDER — FAMOTIDINE IN NACL 20-0.9 MG/50ML-% IV SOLN
20.0000 mg | INTRAVENOUS | Status: DC
  Administered 2016-05-02: 20 mg via INTRAVENOUS
  Filled 2016-04-30 (×3): qty 50

## 2016-04-30 MED ORDER — CETYLPYRIDINIUM CHLORIDE 0.05 % MT LIQD
7.0000 mL | Freq: Two times a day (BID) | OROMUCOSAL | Status: DC
  Administered 2016-04-30 – 2016-05-02 (×2): 7 mL via OROMUCOSAL

## 2016-04-30 MED ORDER — SODIUM CHLORIDE 0.9 % IV SOLN: 250.0000 mL | INTRAVENOUS | Status: DC | PRN

## 2016-04-30 MED ORDER — MIDAZOLAM HCL 2 MG/2ML IJ SOLN
INTRAMUSCULAR | Status: AC
  Filled 2016-04-30: qty 2

## 2016-04-30 MED ORDER — MIDAZOLAM HCL 2 MG/2ML IJ SOLN
INTRAMUSCULAR | Status: DC | PRN
  Administered 2016-04-30: 1 mg via INTRAVENOUS

## 2016-04-30 MED ORDER — SODIUM CHLORIDE 0.9 % IV SOLN
500.0000 mg | Freq: Two times a day (BID) | INTRAVENOUS | Status: DC
  Filled 2016-04-30 (×2): qty 500

## 2016-04-30 MED ORDER — SODIUM CHLORIDE 0.9 % IV SOLN
500.0000 mg | INTRAVENOUS | Status: DC
  Administered 2016-05-01 – 2016-05-02 (×2): 500 mg via INTRAVENOUS
  Filled 2016-04-30 (×2): qty 0.5

## 2016-04-30 MED ORDER — SODIUM CHLORIDE 0.9% FLUSH: 3.0000 mL | INTRAVENOUS | Status: DC | PRN

## 2016-04-30 MED ORDER — IOPAMIDOL (ISOVUE-300) INJECTION 61%
INTRAVENOUS | Status: DC | PRN
  Administered 2016-04-30: 80 mL via INTRA_ARTERIAL

## 2016-04-30 MED ORDER — HEPARIN (PORCINE) IN NACL 2-0.9 UNIT/ML-% IJ SOLN
INTRAMUSCULAR | Status: AC
  Filled 2016-04-30: qty 500

## 2016-04-30 MED ORDER — HEPARIN SODIUM (PORCINE) 5000 UNIT/ML IJ SOLN
5000.0000 [IU] | Freq: Three times a day (TID) | INTRAMUSCULAR | Status: DC
  Administered 2016-04-30 – 2016-05-01 (×3): 5000 [IU] via SUBCUTANEOUS
  Filled 2016-04-30 (×4): qty 1

## 2016-04-30 MED ORDER — FENTANYL CITRATE (PF) 100 MCG/2ML IJ SOLN
INTRAMUSCULAR | Status: AC
  Filled 2016-04-30: qty 2

## 2016-04-30 MED ORDER — SODIUM CHLORIDE 0.9 % IV SOLN
500.0000 mg | INTRAVENOUS | Status: DC
  Administered 2016-04-30: 500 mg via INTRAVENOUS
  Filled 2016-04-30: qty 0.5

## 2016-04-30 MED ORDER — SODIUM CHLORIDE 0.9 % IV SOLN: 500.0000 mg | INTRAVENOUS | Status: DC

## 2016-04-30 MED ORDER — SODIUM CHLORIDE 0.9% FLUSH
3.0000 mL | Freq: Two times a day (BID) | INTRAVENOUS | Status: DC
  Administered 2016-04-30 – 2016-05-02 (×5): 3 mL via INTRAVENOUS

## 2016-04-30 SURGICAL SUPPLY — 12 items
CANNULA 5F STIFF (CANNULA) ×3 IMPLANT
CATH INFINITI 5FR ANG PIGTAIL (CATHETERS) ×3 IMPLANT
CATH INFINITI 5FR JL4 (CATHETERS) ×3 IMPLANT
CATH INFINITI JR4 5F (CATHETERS) ×3 IMPLANT
DEVICE CLOSURE MYNXGRIP 5F (Vascular Products) ×3 IMPLANT
GLIDESHEATH SLEND SS 6F .021 (SHEATH) IMPLANT
KIT MANI 3VAL PERCEP (MISCELLANEOUS) ×3 IMPLANT
NEEDLE PERC 18GX7CM (NEEDLE) ×3 IMPLANT
PACK CARDIAC CATH (CUSTOM PROCEDURE TRAY) ×3 IMPLANT
SHEATH AVANTI 5FR X 11CM (SHEATH) ×3 IMPLANT
WIRE EMERALD 3MM-J .035X150CM (WIRE) ×3 IMPLANT
WIRE SAFE-T 1.5MM-J .035X260CM (WIRE) IMPLANT

## 2016-04-30 NOTE — Progress Notes (Signed)
CONCERNING: IV to Oral Route Change Policy  RECOMMENDATION: This patient is receiving famotidine by the intravenous route.  Based on criteria approved by the Pharmacy and Therapeutics Committee, the intravenous medication(s) is/are being converted to the equivalent oral dose form(s).   DESCRIPTION: These criteria include:  The patient is eating (either orally or via tube) and/or has been taking other orally administered medications for a least 24 hours  The patient has no evidence of active gastrointestinal bleeding or impaired GI absorption (gastrectomy, short bowel, patient on TNA or NPO).  If you have questions about this conversion, please contact the Pharmacy Department  []   [phone] )  [facility] [x]   [phone] )  [facility] []   [phone] )  [facility] []   [phone] )  [facility] []   [phone] )  [place], Specialists [facility] 04/30/2016 1:17 PM

## 2016-04-30 NOTE — Interval H&P Note (Signed)
Cath Lab Visit (complete for each Cath Lab visit)  Clinical Evaluation Leading to the Procedure:   ACS: Yes.    Non-ACS:    Anginal Classification: CCS IV  Anti-ischemic medical therapy: Minimal Therapy (1 class of medications)  Non-Invasive Test Results: No non-invasive testing performed  Prior CABG: No previous CABG      History and Physical Interval Note:  04/30/2016 8:58 AM  [name]  has presented today for surgery, with the diagnosis of unstable angina  The various methods of treatment have been discussed with the patient and family. After consideration of risks, benefits and other options for treatment, the patient has consented to  Procedure(s): Left Heart Cath and Coronary Angiography (N/A) as a surgical intervention .  The patient's history has been reviewed, patient examined, no change in status, stable for surgery.  I have reviewed the patient's chart and labs.  Questions were answered to the patient's satisfaction.     Kathlyn Sacramento

## 2016-04-30 NOTE — Progress Notes (Signed)
PULMONARY / CRITICAL CARE MEDICINE   Name: [name] MRN: [id number] DOB: [birth year]    ADMISSION DATE:  04/25/2016 CONSULTATION DATE:  04/28/2016  REFERRING MD:  Dr. 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 He presented to [facility] on 7/12 for amputation of the right second, third, and fourth toes through the MP joint and excisional debridement of necrotic tissue including skin and subcutaneous tissues, tendon and bone first and fifth ray secondary to atherosclerotic occlusive disease with wound vac placement. He apparently did not follow through with treatment back in May 2017 and progressed to worsening gangrene to his digits. The pt had an angiogram of the right foot on 99991111 without complication. On 7/15 the pt was taken to the OR for a transmetatarsal amputation during surgery pt became bradycardic then PEA arrested CPR performed for 2 minutes, then the pt proceeded to develop ST elevation with cardiac rhythm changing to polymorphic ventricular tachycardic per ACLS protocol 1 mg epinephrine was administered and pt was shocked x1 post shock rhythm noted was sinus tachycardia with frequent PVC's. PCCM consulted post cardiac arrest for mechanical ventilation management.  REVIEW OF SYSTEMS: Positives in BOLD Gen: Denies fever, chills, weight change, fatigue, night sweats HEENT: Denies blurred vision, double vision, hearing loss, tinnitus, sinus congestion, rhinorrhea, sore throat, neck stiffness, dysphagia PULM: Denies shortness of breath, cough, sputum production, hemoptysis, wheezing CV: Denies chest pain, edema, orthopnea, paroxysmal nocturnal dyspnea, palpitations GI: Denies abdominal  pain, nausea, vomiting, diarrhea, hematochezia, melena, constipation, change in bowel habits GU: Denies dysuria, hematuria, polyuria, oliguria, urethral discharge Endocrine: Denies hot or cold intolerance, polyuria, polyphagia or appetite change Derm: Denies rash, dry skin, scaling or peeling skin change Heme: Denies easy bruising, bleeding, bleeding gums Neuro: Denies headache, numbness, weakness, slurred speech, loss of memory or consciousness   VITAL SIGNS: BP 141/105 mmHg  Pulse 74  Temp(Src) 98.3 F (36.8 C) (Oral)  Resp 14  Ht 5\' 7"  (1.702 m)  Wt 253 lb 15.5 oz (115.2 kg)  BMI 39.77 kg/m2  SpO2 100%     VENTILATOR SETTINGS: Vent Mode:  [-] PRVC FiO2 (%):  [40 %] 40 % Set Rate:  [15 bmp] 15 bmp Vt Set:  [500 mL] 500 mL PEEP:  [5 cmH20] 5 cmH20 Plateau Pressure:  [24 cmH20] 24 cmH20  INTAKE / OUTPUT: I/O last 3 completed shifts: In: 931.5 [P.O.:120; I.V.:551.5; Other:100; NG/GT:60; IV Piggyback:100] Out: 1000 [Other:1000]  PHYSICAL EXAMINATION: General:  Well nourished, well developed  Neuro:  Alert and oriented, follows commands HEENT:  Supple, no JVD Cardiovascular:  s1s2, rrr, no M/R/G Lungs:  expiratory wheezes RUL diminished all other lobes, non labored Abdomen:  Obese, +BS x4, soft, non tender, non distended Musculoskeletal:  Left BKA, right foot transmetatarsal amputation Skin:  Right lower extremity incision dressing dry and intact wound vac present   LABS:  BMET  Recent Labs Lab 04/28/16 1111 04/29/16 0618 04/30/16 0010  NA 138 139 139  K 5.2* 5.6* 4.4  CL 99* 100* 98*  CO2 29 29 32  BUN 57* 40* 30*  CREATININE 7.59* 6.33* 4.58*  GLUCOSE 183* 128* 145*    Electrolytes  Recent Labs Lab 04/28/16 1111 04/29/16 0618 04/30/16 0010  CALCIUM 8.0* 8.8* 7.9*  MG  2.4  --  2.2  PHOS 6.4*  --   --     CBC  Recent Labs Lab 04/28/16 1111 04/29/16 0618 04/30/16 0509  WBC 15.5* 13.3* 9.6  HGB 7.3* 8.0* 7.4*  HCT 23.9* 26.0* 23.5*  PLT  300 251 210    Coag's  Recent Labs Lab 04/23/16 1503 04/30/16 0616  APTT 26  --   INR 1.31 1.47    Sepsis Markers  Recent Labs Lab 04/28/16 1111 04/29/16 0618 04/30/16 0509  LATICACIDVEN 0.9  --   --   PROCALCITON 1.80 3.15 3.53    ABG  Recent Labs Lab 04/28/16 1227 04/29/16 0500  PHART 7.38 7.35  PCO2ART 48 59*  PO2ART 87 40*    Liver Enzymes  Recent Labs Lab 04/28/16 1111  AST 23  ALT 8*  ALKPHOS 245*  BILITOT 0.5  ALBUMIN 3.1*    Cardiac Enzymes  Recent Labs Lab 04/29/16 0618 04/29/16 1131 04/29/16 1756  TROPONINI 0.08* 0.06* 0.07*    Glucose  Recent Labs Lab 04/28/16 1629 04/28/16 2147 04/29/16 0715 04/29/16 1231 04/29/16 1556 04/29/16 2138  GLUCAP 125* 89 126* 137* 99 146*    Imaging No results found.   STUDIES:  Echo 7/15>>EF 40% to 45%  CULTURES: Aerobic/Anaerobic Culture of right foot 7/15>>ecoli and pseudomonas aeruginosa 7/16>>  ANTIBIOTICS: Zosyn 7/15>> Vancomycin 7/12>>7/15  SIGNIFICANT EVENTS: -7/12 pt admitted [facility] for amputation of the right second, third, and fourth toes through the MP joint and excisional debridement of necrotic tissue including skin and subcutaneous tissues, tendon and bone first and fifth ray secondary to atherosclerotic occlusive disease with wound vac placement -7/14 right lower extremity angiogram performed -7/15 pt taken to OR for right tansmetatarsal amputation during surgery pt cardiac arrested -7/15 PCCM consulted post cardiac arrest and mechanical vent management -7/16 Pt extubated    LINES/TUBES: PIV x1 ETT 7/15>>7/16 R internal jugular CVL 7/15>> Left femoral arterial line 7/15>>7/15 OG tube 7/15>>7/16  DISCUSSION: This is a 55 yo male who presented to [facility]/12 for amputation of the right second, third, and fourth toes through the MP joint and excisional debridement of necrotic tissue including skin and subcutaneous tissues, tendon and bone first and fifth ray  secondary to atherosclerotic occlusive disease with wound vac placement. The pt had an angiogram of the right foot on 99991111 without complication. On 7/15 the pt was taken to the OR for a transmetatarsal amputation and cardiac arrested during surgery   ASSESSMENT / PLAN:   PULMONARY A: Mechanical ventilation post cardiac arrest -resolved P:   Supplemental O2 as needed to maintain O2 sats 92% and above Pulmonary hygiene Intermittent CXR  CARDIOVASCULAR A:  Cardiac arrest due to polymorphic ventricular tachycardia during surgery most likely etiology is an ischemic cardiac event  Hx: HTN, Atherosclerosis, Stroke P:  Cardiac Catheterization 7/17 Cardiology consulted appreciate input Heparin drip pharmacy to dose Telemetry monitoring  RENAL A:   Hx: Chronic Kidney Disease Hemodialysis P:   Nephrology consulted appreciate input Follow BMP's Replace electrolytes as indicated  GASTROINTESTINAL A:   No acute issues  P: NPO for now resume diet post Cardiac Catheterization    HEMATOLOGIC A:   Anemia P:  Trend CBC Heparin drip for VTE prophylaxis Monitor for s/sx of bleeding Transfuse if hgb <7  INFECTIOUS A:   Right foot necrotic toes- status post excisional debridement of second, third, and fourth toes on 04/25/2016; status post right foot transmetatarsal amputation 04/28/2016 Leukocytosis  P:   Continue abx as listed  above Trend WBC and monitor fever curve  ENDOCRINE A:   Hx: DM P:   CBG's q4hrs SSI  NEUROLOGY A: Post-op pain P: Continue prn morphine and norco   -Will transfer service to Dr. Tressia Miners today 04/30/2016 she is aware  Marda Stalker, [place] Pager [phone] (please enter 7 digits) [place] Pager [phone] (please enter 7 digits)   Pt has been seen by me with AGNP Blakeney. Agree with above. As of AM 07/18, PCCM will sign off. Please call if we can be of further assistance  Merton Border, MD PCCM  service Mobile [phone] Pager [phone] 04/30/2016

## 2016-04-30 NOTE — Progress Notes (Signed)
Patient is alert and oriented, reporting no pain since returning from procedure. Right groin site with gauze and tegaderm is clean dry intact with no evidence of hematoma. Pulses at pre-procedure baseline for patient. Closure device deployed at 0940, lay flat time in MD orders. Report called to [facility] in ICU. Will transfer shortly.

## 2016-04-30 NOTE — Progress Notes (Signed)
Chaplain rounded the unit to provide a compassionate presence and support to the 55 year old male patient. The patient appeared to be sleeping and spiritual support was provided through silent prayer given by the Chaplain. Minerva Fester [phone]

## 2016-04-30 NOTE — Progress Notes (Signed)
Notified Dr. Lake Bells of trigeminal PVC's post dialysis to treat hyperkalemia. Pt is asymptomatic and expresses no complaints at this time. Labs ordered and drawn. Will continue to reassess.

## 2016-04-30 NOTE — Progress Notes (Signed)
Patient transported to specials, report given to RN. Wilnette Kales

## 2016-04-30 NOTE — H&P (View-Only) (Signed)
SUBJECTIVE:  The patient was extubated this morning. He feels better overall. He reports no chest pain or shortness of breath.   Filed Vitals:   04/29/16 0800 04/29/16 0900 04/29/16 1000 04/29/16 1100  BP: 162/95 150/90 160/92 154/91  Pulse: 84 86 87 85  Temp: 98.5 F (36.9 C)     TempSrc: Axillary     Resp: 13 11 14 14   Height:      Weight:      SpO2: 100% 100% 100% 100%    Intake/Output Summary (Last 24 hours) at 04/29/16 1221 Last data filed at 04/29/16 1200  Gross per 24 hour  Intake 634.55 ml  Output      0 ml  Net 634.55 ml    LABS: Basic Metabolic Panel:  Recent Labs  04/28/16 1111 04/29/16 0618  NA 138 139  K 5.2* 5.6*  CL 99* 100*  CO2 29 29  GLUCOSE 183* 128*  BUN 57* 40*  CREATININE 7.59* 6.33*  CALCIUM 8.0* 8.8*  MG 2.4  --   PHOS 6.4*  --    Liver Function Tests:  Recent Labs  04/28/16 1111  AST 23  ALT 8*  ALKPHOS 245*  BILITOT 0.5  PROT 6.2*  ALBUMIN 3.1*   No results for input(s): LIPASE, AMYLASE in the last 72 hours. CBC:  Recent Labs  04/28/16 1111 04/29/16 0618  WBC 15.5* 13.3*  HGB 7.3* 8.0*  HCT 23.9* 26.0*  MCV 87.3 89.0  PLT 300 251   Cardiac Enzymes:  Recent Labs  04/28/16 1731 04/28/16 2330 04/29/16 0618  TROPONINI 0.07* 0.09* 0.08*   BNP: Invalid input(s): POCBNP D-Dimer: No results for input(s): DDIMER in the last 72 hours. Hemoglobin A1C: No results for input(s): HGBA1C in the last 72 hours. Fasting Lipid Panel: No results for input(s): CHOL, HDL, LDLCALC, TRIG, CHOLHDL, LDLDIRECT in the last 72 hours. Thyroid Function Tests: No results for input(s): TSH, T4TOTAL, T3FREE, THYROIDAB in the last 72 hours.  Invalid input(s): FREET3 Anemia Panel: No results for input(s): VITAMINB12, FOLATE, FERRITIN, TIBC, IRON, RETICCTPCT in the last 72 hours.   PHYSICAL EXAM General: Well developed, well nourished, in no acute distress HEENT:  Normocephalic and atramatic Neck:  No JVD.  Lungs: Clear  bilaterally to auscultation and percussion. Heart: HRRR . Normal S1 and S2 without gallops or murmurs.  Abdomen: Bowel sounds are positive, abdomen soft and non-tender  Msk:  Back normal, normal gait. Normal strength and tone for age. Extremities: Left BKA. Femoral pulses are normal. Neuro: Alert and oriented X 3. Psych:  Good affect, responds appropriately  TELEMETRY: Reviewed telemetry pt in normal sinus rhythm with no evidence of arrhythmia.  ASSESSMENT AND PLAN:  1. Status post cardiac arrest due to polymorphic ventricular tachycardia during surgery yesterday: There is no evidence of myocardial infarction as his troponin remained flat. I personally reviewed his echocardiogram which showed mildly reduced LV systolic function with an ejection fraction of 40-45%. Wall motion could not be evaluated due to challenging image quality. Typically the most likely etiology for ventricular tachycardia is an ischemic cardiac events especially in somebody with established history of atherosclerosis and multiple risk factors. Thus, I have recommended proceeding with cardiac catheterization and possible coronary intervention to be done tomorrow. I discussed the procedure in details with the patient as well as with his family members with the help of an interpreter. I discussed risks and benefits.  2. Peripheral arterial disease with previous left BKA and status post right toe amputation for  gangrene. Followed by vascular surgery. No further procedures are planned.  3. End stage renal disease on hemodialysis: He is getting dialysis again today.   4. Anemia: Improved with blood transfusion. No evidence of active bleeding.   Kathlyn Sacramento, MD, [facility] 04/29/2016 12:21 PM

## 2016-04-30 NOTE — Progress Notes (Signed)
Patient back on unit from cath, vs and R groin stable. Dr. Tressia Miners notified of results and received orders for patient to be transferred to telemetry. Orders placed. [name]

## 2016-04-30 NOTE — Care Management (Signed)
Home Avance (not Advanced) VAC was picked up by Jeneen Rinks with [facility] since patient did not discharge within 48 hours of delivery of VAC to hospital (per Medicare guidelines as it would not be paid for while still admitted). I have talked with Jeneen Rinks at [facility] and he is ready to deliver when patient is ready to go home however CSW is also following for SNF (which will provide VAC). Jeneen Rinks contact number is [phone] fax [phone]. Patient will also need orders/face to face for home health nursing, social worker, nursing aide, and PT if able to return home. He if open to [facility] [phone]). Abby has been updated on patient progress. Dialysis T, Th, Sat- Kim Riddle dialysis liaison notified of patient admission.

## 2016-04-30 NOTE — Progress Notes (Signed)
[place] at [place] NAME: [name]    MR#:  [id number]  DATE OF BIRTH:  1961/09/03  SUBJECTIVE:  CHIEF COMPLAINT:  No chief complaint on file.  - Patient had a cardiac arrest at the end of his procedure for right transmetatarsal amputation- intubated, on heparin drip. - Extubated 04/29/16, doing well- for cardiac cath today - Had dialysis yesterday, still has PVCs   REVIEW OF SYSTEMS:  Review of Systems  Constitutional: Negative for fever, chills and malaise/fatigue.  HENT: Negative for ear discharge, ear pain and nosebleeds.   Eyes: Negative for blurred vision and double vision.  Respiratory: Negative for cough, shortness of breath and wheezing.   Cardiovascular: Negative for chest pain, palpitations and leg swelling.  Gastrointestinal: Negative for nausea, vomiting, abdominal pain, diarrhea and constipation.  Genitourinary: Negative for dysuria.  Musculoskeletal: Negative for myalgias and joint pain.       Much improved Right foot pain  Neurological: Negative for dizziness, sensory change, speech change, focal weakness, seizures and headaches.  Psychiatric/Behavioral: Negative for depression.    DRUG ALLERGIES:  No Known Allergies  VITALS:  Blood pressure 141/105, pulse 74, temperature 98.3 F (36.8 C), temperature source Oral, resp. rate 14, height 5\' 7"  (1.702 m), weight 115.2 kg (253 lb 15.5 oz), SpO2 100 %.  PHYSICAL EXAMINATION:  Physical Exam  GENERAL:  55 y.o.-year-old patient lying in the bed with no acute distress. EYES: Pupils equal, round, reactive to light and accommodation. No scleral icterus. Extraocular muscles intact.  HEENT: Head atraumatic, normocephalic. Oropharynx and nasopharynx clear.  NECK:  Supple, no jugular venous distention. No thyroid enlargement, no tenderness.  LUNGS: Normal breath sounds bilaterally, no wheezing, rales,rhonchi or crepitation. No use of accessory muscles of respiration.   CARDIOVASCULAR: S1, S2 normal. No murmurs, rubs, or gallops.  ABDOMEN: Soft, nontender, nondistended. Bowel sounds present. No organomegaly or mass.  EXTREMITIES: Left BKA, right fourth toes amputated, has a wound VAC in place. Some swelling of the foot noted. Left arm AV fistula present NEUROLOGIC: Cranial nerves II through XII are intact. Muscle strength 5/5 in all extremities except limited in right foot due to recent surgery. Left leg is status post BKA. Sensation intact. Gait not checked.  PSYCHIATRIC: The patient is alert and oriented x 3.  SKIN: No obvious rash, lesion, or ulcer.    LABORATORY PANEL:   CBC  Recent Labs Lab 04/30/16 0509  WBC 9.6  HGB 7.4*  HCT 23.5*  PLT 210   ------------------------------------------------------------------------------------------------------------------  Chemistries   Recent Labs Lab 04/28/16 1111  04/30/16 0010  NA 138  < > 139  K 5.2*  < > 4.4  CL 99*  < > 98*  CO2 29  < > 32  GLUCOSE 183*  < > 145*  BUN 57*  < > 30*  CREATININE 7.59*  < > 4.58*  CALCIUM 8.0*  < > 7.9*  MG 2.4  --  2.2  AST 23  --   --   ALT 8*  --   --   ALKPHOS 245*  --   --   BILITOT 0.5  --   --   < > = values in this interval not displayed. ------------------------------------------------------------------------------------------------------------------  Cardiac Enzymes  Recent Labs Lab 04/29/16 1756  TROPONINI 0.07*   ------------------------------------------------------------------------------------------------------------------  RADIOLOGY:  Ct Head Wo Contrast  04/28/2016  CLINICAL DATA:  55 year old male with a history of cardiac arrest EXAM: CT HEAD WITHOUT CONTRAST TECHNIQUE: Contiguous axial  images were obtained from the base of the skull through the vertex without intravenous contrast. COMPARISON:  02/17/2007 FINDINGS: Unremarkable appearance of the calvarium without acute fracture or aggressive lesion. Unremarkable appearance of the  scalp soft tissues. Unremarkable appearance of the bilateral orbits. Enteric and orogastric tube incompletely imaged. Mastoid air cells are clear. No significant paranasal sinus disease No acute intracranial hemorrhage.  No midline shift or mass effect. Hypodensity in the periventricular white matter overlying the bilateral frontal horn of the lateral ventricles. Focal hypodensity in the left centrum semi ovale, new from the comparison. Gray-white differentiation relatively maintained. Extensive calcifications of the vasculature. IMPRESSION: No CT evidence of acute intracranial abnormality. Evidence of interval lacunar infarction/ischemic changes of the left centrum semiovale, new from the comparison CT of 2008. Intracranial atherosclerosis. Signed, Dulcy Fanny. Earleen Newport, DO [facility] Radiology Electronically Signed   By: Corrie Mckusick D.O.   On: 04/28/2016 14:16   Dg Chest Port 1 View  04/29/2016  CLINICAL DATA:  Evaluate for interval changes EXAM: PORTABLE CHEST 1 VIEW COMPARISON:  04/28/2016 FINDINGS: ET tube tip is above the carina. The nasogastric tube tip is below the GE junction. There is a right IJ catheter with tip projecting over the cavoatrial junction. The heart size appears enlarged. There is been interval clearing of left mid lung and left upper lobe opacities. No new findings. IMPRESSION: Interval clearing of left midlung and left upper lobe airspace opacities. Electronically Signed   By: Kerby Moors M.D.   On: 04/29/2016 08:19   Dg Chest Port 1 View  04/28/2016  CLINICAL DATA:  Acute respiratory failure. EXAM: PORTABLE CHEST 1 VIEW COMPARISON:  Radiographs of December 02, 2008. FINDINGS: Stable cardiomediastinal silhouette. Atherosclerosis of thoracic aorta is noted. Endotracheal tube projected over tracheal air shadow with distal tip 5 cm above the carina. Nasogastric tube is seen entering stomach. Right internal jugular catheter is noted with  distal tip in expected position of SVC. No pneumothorax or pleural effusion is noted. Right upper lobe opacity is noted concerning for edema or inflammation. Left perihilar opacity is noted concerning for edema or inflammation. IMPRESSION: Endotracheal and nasogastric tubes in grossly good position. Bilateral lung opacities are noted concerning for edema or pneumonia. Electronically Signed   By: Marijo Conception, M.D.   On: 04/28/2016 11:38    EKG:   Orders placed or performed during the hospital encounter of 04/25/16  . EKG 12-Lead  . EKG 12-Lead  . EKG 12-Lead  . EKG 12-Lead    ASSESSMENT AND PLAN:   55 year old male with past medical history significant for end-stage renal disease on Tuesday Thursday Saturday hemodialysis, diabetes mellitus, peripheral vascular disease status post left BKA in the past admitted with necrosis of right foot second, third and fourth toes.  #1 right foot necrotic toes-status post excisional debridement of second third and fourth toes on 04/25/2016.Status post angiogram done to improve blood flow to the right leg on 04/27/2016 -Appreciate vascular and podiatry consults -s/p transmetatarsal amputation of the right foot 04/28/16 -on vancomycin and zosyn now for now.  Has a wound VAC in place. - will need rehab likely.  #2 Cardiac arrest during surgery- especially patient having significant atherosclerotic disease-  Ischemic event need to be ruled out - Appreciate cardiology consult - on heparin drip. Statin and metoprolol added. - for cardiac cath today  #3 end-stage renal disease on hemodialysis-on Tuesday, Thursday and Saturday schedule. Nephrology consulted. -had dialysis yesterday, dialysis tomorrow per schedule -Also  takes midodrine at home  #4 diabetes mellitus-A1c is only 5.6. Sugars are decently controlled. -Continue sliding scale insulin  #5 PVD-improved blood flow to right leg after angiogram and angioplasty -Continue aspirin.  #6 acute on  chronic anemia-anemia of chronic kidney disease. No transfusion indicated unless hemoglobin less than 7. Especially monitor on heparin. -Monitor at this time. Epogen with dialysis.  #7 DVT prophylaxis- currently on heparin.  Physical therapy after cardiac procedures. Social worker consult based on that.    All the records are reviewed and case discussed with Care Management/Social Workerr. Management plans discussed with the patient, family and they are in agreement.  CODE STATUS: Full code  TOTAL TIME TAKING CARE OF THIS PATIENT: 35 minutes.   Possible discharge in 2 days   Lola Lofaro M.D on 04/30/2016 at 8:07 AM  Between 7am to 6pm - Pager - [phone]  After 6pm go to [email or web address] - password [email or web address] [facility]  Office  [phone]  CC: Primary care physician; PROVIDER NOT IN SYSTEM

## 2016-04-30 NOTE — Progress Notes (Signed)
Pharmacy Antibiotic Note  [name] is a 55 y.o. male admitted on 04/25/2016 with right foot necrotic toes, s/p excisional debridement of second, third, and fourth toes.    Plan: Patient with ESBL Ecoli, will transition patient from Zosyn to Meropenem to cover both ESBL and Psuedomonas. Patient receives regularly scheduled dialysis. Will initiate meropenem 500mg  IV Q24hr.    Height: 5\' 7"  (170.2 cm) Weight: 253 lb 15.5 oz (115.2 kg) IBW/kg (Calculated) : 66.1  Temp (24hrs), Avg:98.2 F (36.8 C), Min:98 F (36.7 C), Max:98.3 F (36.8 C)   Recent Labs Lab 04/27/16 0444 04/28/16 0355 04/28/16 1111 04/29/16 0618 04/30/16 0010 04/30/16 0509  WBC 9.9 9.8 15.5* 13.3*  --  9.6  CREATININE 5.62* 7.16* 7.59* 6.33* 4.58*  --   LATICACIDVEN  --   --  0.9  --   --   --     Estimated Creatinine Clearance: 21.8 mL/min (by C-G formula based on Cr of 4.58).    No Known Allergies  Antimicrobials this admission: Meropenem  7/17 >>  Zosyn 7/15 >> 7/17 Vancomycin 7/12 >> 7/12  Dose adjustments this admission: N/A  Microbiology results: 7/15 Wound (Right foot) Cx: ESBL Ecoli. Pseudomonas  7/15 MRSA PCR: negative  Caitlynn Ju L 04/30/2016 1:47 PM

## 2016-04-30 NOTE — Progress Notes (Signed)
ANTICOAGULATION CONSULT NOTE - Initial Consult  Pharmacy Consult for Heparin  Indication: chest pain/ACS  No Known Allergies  Patient Measurements: Height: 5\' 7"  (170.2 cm) Weight: 253 lb 15.5 oz (115.2 kg) IBW/kg (Calculated) : 66.1 Heparin Dosing Weight: 91.7 kg   Vital Signs: Temp: 98.3 F (36.8 C) (07/17 0400) Temp Source: Oral (07/17 0400) BP: 141/105 mmHg (07/17 0600) Pulse Rate: 74 (07/17 0600)  Labs:  Recent Labs  04/28/16 1111  04/29/16 0618 04/29/16 1131 04/29/16 1756 04/29/16 1940 04/30/16 0010 04/30/16 0509 04/30/16 0616  HGB 7.3*  --  8.0*  --   --   --   --  7.4*  --   HCT 23.9*  --  26.0*  --   --   --   --  23.5*  --   PLT 300  --  251  --   --   --   --  210  --   LABPROT  --   --   --   --   --   --   --   --  17.9*  INR  --   --   --   --   --   --   --   --  1.47  HEPARINUNFRC  --   --   --  0.22*  --  0.25*  --   --  0.31  CREATININE 7.59*  --  6.33*  --   --   --  4.58*  --   --   TROPONINI 0.03*  < > 0.08* 0.06* 0.07*  --   --   --   --   < > = values in this interval not displayed.  Estimated Creatinine Clearance: 21.8 mL/min (by C-G formula based on Cr of 4.58).   Medical History: Past Medical History  Diagnosis Date  . Chronic kidney disease   . Hypertension   . Diabetes mellitus without complication ([place])   . Hypercholesteremia   . Atherosclerosis   . Stroke ([place])   . Anemia     Medications:  Prescriptions prior to admission  Medication Sig Dispense Refill Last Dose  . sevelamer carbonate (RENVELA) 800 MG tablet Take 800 mg by mouth 3 (three) times daily with meals.   04/24/2016 at Unknown time  . aspirin 325 MG tablet Take 325 mg by mouth daily.   04/23/2016  . cinacalcet (SENSIPAR) 30 MG tablet Take 60 mg by mouth daily.    04/23/2016  . glipiZIDE (GLUCOTROL) 10 MG tablet Take 10 mg by mouth daily before breakfast.   04/23/2016  . midodrine (PROAMATINE) 10 MG tablet Take 10 mg by mouth 3 (three) times daily.   04/24/2016     Assessment: Pharmacy consulted to dose heparin in this 55 year old male with ACS.  Pt was previously on heparin 5000 units SQ Q8H, last dose on 7/15 @ 0500.     Goal of Therapy:  Heparin level 0.3-0.7 units/ml Monitor platelets by anticoagulation protocol: Yes   Plan:  HL= 0.22. Drip not paused per RN. Will bolus heparin 1400 units iv once and increase infusion to 1750 units/hr. Will recheck a HL in 8 hours.   7/16:  HL @ 20:30 = 0.25 Will order Heparin 1400 units IV X 1 bolus and increase drip rate to 1950 units/hr.   Will recheck HL 8 hrs after rate change .   7/17 AM heparin level 0.31. Recheck in 8 hours to confirm.  [name] S 04/30/2016,6:46 AM

## 2016-05-01 ENCOUNTER — Inpatient Hospital Stay: Payer: Medicare Other

## 2016-05-01 LAB — CBC WITH DIFFERENTIAL/PLATELET
BASOS ABS: 0.1 10*3/uL (ref 0–0.1)
BASOS PCT: 1 %
EOS PCT: 12 %
Eosinophils Absolute: 1.1 10*3/uL — ABNORMAL HIGH (ref 0–0.7)
HCT: 23.5 % — ABNORMAL LOW (ref 40.0–52.0)
Hemoglobin: 7.5 g/dL — ABNORMAL LOW (ref 13.0–18.0)
LYMPHS PCT: 13 %
Lymphs Abs: 1.3 10*3/uL (ref 1.0–3.6)
MCH: 28 pg (ref 26.0–34.0)
MCHC: 31.9 g/dL — ABNORMAL LOW (ref 32.0–36.0)
MCV: 87.8 fL (ref 80.0–100.0)
Monocytes Absolute: 1 10*3/uL (ref 0.2–1.0)
Monocytes Relative: 11 %
NEUTROS ABS: 6.1 10*3/uL (ref 1.4–6.5)
Neutrophils Relative %: 63 %
PLATELETS: 244 10*3/uL (ref 150–440)
RBC: 2.68 MIL/uL — AB (ref 4.40–5.90)
RDW: 21.2 % — ABNORMAL HIGH (ref 11.5–14.5)
WBC: 9.6 10*3/uL (ref 3.8–10.6)

## 2016-05-01 LAB — BASIC METABOLIC PANEL
ANION GAP: 10 (ref 5–15)
BUN: 40 mg/dL — ABNORMAL HIGH (ref 6–20)
CO2: 30 mmol/L (ref 22–32)
Calcium: 8.3 mg/dL — ABNORMAL LOW (ref 8.9–10.3)
Chloride: 98 mmol/L — ABNORMAL LOW (ref 101–111)
Creatinine, Ser: 6.26 mg/dL — ABNORMAL HIGH (ref 0.61–1.24)
GFR, EST AFRICAN AMERICAN: 10 mL/min — AB (ref >60)
GFR, EST NON AFRICAN AMERICAN: 9 mL/min — AB (ref >60)
GLUCOSE: 105 mg/dL — AB (ref 65–99)
POTASSIUM: 4.5 mmol/L (ref 3.5–5.1)
Sodium: 138 mmol/L (ref 135–145)

## 2016-05-01 LAB — GLUCOSE, CAPILLARY
GLUCOSE-CAPILLARY: 106 mg/dL — AB (ref 65–99)
GLUCOSE-CAPILLARY: 159 mg/dL — AB (ref 65–99)
Glucose-Capillary: 117 mg/dL — ABNORMAL HIGH (ref 65–99)
Glucose-Capillary: 117 mg/dL — ABNORMAL HIGH (ref 65–99)

## 2016-05-01 LAB — SURGICAL PATHOLOGY

## 2016-05-01 MED ORDER — SODIUM CHLORIDE 0.9% FLUSH: 10.0000 mL | INTRAVENOUS | Status: DC | PRN

## 2016-05-01 MED ORDER — SODIUM CHLORIDE 0.9% FLUSH
10.0000 mL | Freq: Two times a day (BID) | INTRAVENOUS | Status: DC
  Administered 2016-05-01 – 2016-05-02 (×2): 10 mL

## 2016-05-01 MED ORDER — BISACODYL 5 MG PO TBEC
10.0000 mg | DELAYED_RELEASE_TABLET | Freq: Every day | ORAL | Status: DC | PRN
  Administered 2016-05-01: 10 mg via ORAL
  Filled 2016-05-01 (×2): qty 2

## 2016-05-01 NOTE — Progress Notes (Signed)
Hemodialysis start 

## 2016-05-01 NOTE — Progress Notes (Signed)
CSW and interpreter met with patient and his family at bedside. CSW presented bed offers. Accepted bed offer at [facility]. Inquired about interpreter being available at [facility] and if [facility] can transport him at discharge. CSW told patient's that she will gather information and follow back up with them tomorrow. Patient stated that EMS transport costs too much and he cannot afford the co-pay. CSW called [facility] to determine if they can transport patient tomorrow. Left voicemail for Romie Minus. Awaiting phone call back. CSW left voicemail for [employer] at [facility] to inform him that patient accepted bed offer and to determine if an interpreter would be available for patient when he's at [facility]. CSW will continue to follow and assist.  Ernest Pine, MSW, [place], [place] Social Worker [phone]

## 2016-05-01 NOTE — Progress Notes (Signed)
Patient is transferred from the Unit to room 258. Alert and oriented x 4. Denied any pain, no respiratory distress noted. Tele box called to CCMD with a second verifier Yasmin S. RN. Patient was oriented to this unit. Bed alarm activated and the bed is in the lowest position. Will continue to monitor.

## 2016-05-01 NOTE — Progress Notes (Signed)
Pre-hd tx 

## 2016-05-01 NOTE — Progress Notes (Signed)
[place] at [place] NAME: [name]    MR#:  [id number]  DATE OF BIRTH:  [birth year]  SUBJECTIVE:  CHIEF COMPLAINT:  No chief complaint on file.  - for dialysis today, denies any complaints, wife at bedside - used a Engineer, manufacturing systems for translation - coughing spell last night   REVIEW OF SYSTEMS:  Review of Systems  Constitutional: Negative for fever, chills and malaise/fatigue.  HENT: Negative for ear discharge, ear pain and nosebleeds.   Eyes: Negative for blurred vision and double vision.  Respiratory: Positive for cough and shortness of breath. Negative for wheezing.   Cardiovascular: Negative for chest pain, palpitations and leg swelling.  Gastrointestinal: Negative for nausea, vomiting, abdominal pain, diarrhea and constipation.  Genitourinary: Negative for dysuria.  Musculoskeletal: Negative for myalgias and joint pain.       Much improved Right foot pain  Neurological: Negative for dizziness, sensory change, speech change, focal weakness, seizures and headaches.  Psychiatric/Behavioral: Negative for depression.    DRUG ALLERGIES:  No Known Allergies  VITALS:  Blood pressure 118/64, pulse 76, temperature 98.2 F (36.8 C), temperature source Oral, resp. rate 18, height 5\' 7"  (1.702 m), weight 120.652 kg (265 lb 15.8 oz), SpO2 90 %.  PHYSICAL EXAMINATION:  Physical Exam  GENERAL:  55 y.o.-year-old patient lying in the bed with no acute distress. EYES: Pupils equal, round, reactive to light and accommodation. No scleral icterus. Extraocular muscles intact.  HEENT: Head atraumatic, normocephalic. Oropharynx and nasopharynx clear.  NECK:  Supple, no jugular venous distention. No thyroid enlargement, no tenderness.  LUNGS: Normal breath sounds bilaterally, no wheezing, rales,rhonchi or crepitation. No use of accessory muscles of respiration.fine bibasilar crackles present.  CARDIOVASCULAR: S1, S2 normal. No murmurs,  rubs, or gallops.  ABDOMEN: Soft, nontender, nondistended. Bowel sounds present. No organomegaly or mass.  EXTREMITIES: Left BKA, right fourth toes amputated, has a wound VAC in place. Some swelling of the foot noted. Left arm AV fistula present NEUROLOGIC: Cranial nerves II through XII are intact. Muscle strength 5/5 in all extremities except limited in right foot due to recent surgery. Left leg is status post BKA. Sensation intact. Gait not checked.  PSYCHIATRIC: The patient is alert and oriented x 3.  SKIN: No obvious rash, lesion, or ulcer.    LABORATORY PANEL:   CBC  Recent Labs Lab 05/01/16 0500  WBC 9.6  HGB 7.5*  HCT 23.5*  PLT 244   ------------------------------------------------------------------------------------------------------------------  Chemistries   Recent Labs Lab 04/28/16 1111  04/30/16 0010 05/01/16 0500  NA 138  < > 139 138  K 5.2*  < > 4.4 4.5  CL 99*  < > 98* 98*  CO2 29  < > 32 30  GLUCOSE 183*  < > 145* 105*  BUN 57*  < > 30* 40*  CREATININE 7.59*  < > 4.58* 6.26*  CALCIUM 8.0*  < > 7.9* 8.3*  MG 2.4  --  2.2  --   AST 23  --   --   --   ALT 8*  --   --   --   ALKPHOS 245*  --   --   --   BILITOT 0.5  --   --   --   < > = values in this interval not displayed. ------------------------------------------------------------------------------------------------------------------  Cardiac Enzymes  Recent Labs Lab 04/29/16 1756  TROPONINI 0.07*   ------------------------------------------------------------------------------------------------------------------  RADIOLOGY:  No results found.  EKG:   Orders placed or  performed during the hospital encounter of 04/25/16  . EKG 12-Lead  . EKG 12-Lead  . EKG 12-Lead  . EKG 12-Lead    ASSESSMENT AND PLAN:   55 year old male with past medical history significant for end-stage renal disease on Tuesday Thursday Saturday hemodialysis, diabetes mellitus, peripheral vascular disease status  post left BKA in the past admitted with necrosis of right foot second, third and fourth toes.  #1 right foot necrotic toes-  -status post excisional debridement of second third and fourth toes on 04/25/2016.Status post angiogram done to improve blood flow to the right leg on 04/27/2016 - s/p transmetatarsal amputation with clean healthy margins now on 04/28/16- has a wound vac in place -Appreciate vascular and podiatry consults. - intra-op cultures growing ESBL E.coli and pseudomonas- on meropenem, couldn't get PICC line- will check with podiatry and ID for duration of ABX - will need rehab likely.  #2 CAD- s/p cardiac cath showing occlusive RCA with collaterals, also ischemic cardiomyopathy EF 30-35%- medical management recommended - Appreciate cardiology consult -  Aspirin, Statin and metoprolol added.  #3 end-stage renal disease on hemodialysis-on Tuesday, Thursday and Saturday schedule. Nephrology consulted. -dialysis today -Also takes midodrine at home  #4 diabetes mellitus-A1c is only 5.6. Sugars are decently controlled. -Continue sliding scale insulin  #5 PVD-improved blood flow to right leg after angiogram and angioplasty -Continue aspirin.  #6 acute on chronic anemia-anemia of chronic kidney disease. No transfusion indicated unless hemoglobin less than 7. -Monitor at this time. Epogen with dialysis.  #7 DVT prophylaxis- on SQ heparin.  Physical therapy today Social worker consult Anticipate discharge to rehab tomorrow    All the records are reviewed and case discussed with Care Management/Social Workerr. Management plans discussed with the patient, family and they are in agreement.  CODE STATUS: Full code  TOTAL TIME TAKING CARE OF THIS PATIENT: 37 minutes.   Possible discharge in 1-2 days   Raynah Gomes M.D on 05/01/2016 at 8:17 AM  Between 7am to 6pm - Pager - [phone]  After 6pm go to [email or web address] - password [email or web address] [facility]   Office  [phone]  CC: Primary care physician; PROVIDER NOT IN SYSTEM

## 2016-05-01 NOTE — Progress Notes (Signed)
Post hd tx 

## 2016-05-01 NOTE — Progress Notes (Signed)
Patient Demographics  [name], is a 55 y.o. male   MRN: [id number]   DOB - 12-19-1960  Admit Date - 04/25/2016    Outpatient Primary MD for the patient is PROVIDER NOT [place] requested in the Hospital by Gladstone Lighter, MD, On 05/01/2016     With History of -  Past Medical History  Diagnosis Date  . Chronic kidney disease   . Hypertension   . Diabetes mellitus without complication ([place])   . Hypercholesteremia   . Atherosclerosis   . Stroke ([place])   . Anemia       Past Surgical History  Procedure Laterality Date  . Av fistula placement Left 2015  . Peripheral vascular catheterization N/A 10/03/2015    Procedure: A/V Shuntogram/Fistulagram;  Surgeon: [name], MD;  Location: [facility];  Service: Cardiovascular;  Laterality: N/A;  . Peripheral vascular catheterization N/A 01/16/2016    Procedure: Abdominal Aortogram w/Lower Extremity;  Surgeon: [name], MD;  Location: [facility];  Service: Cardiovascular;  Laterality: N/A;  . Peripheral vascular catheterization  01/16/2016    Procedure: Lower Extremity Intervention;  Surgeon: [name], MD;  Location: [facility];  Service: Cardiovascular;;  . Leg amputation below knee Left   . Wound debridement Right 04/25/2016    Procedure: DEBRIDEMENT WOUND;  Surgeon: Katha Cabal, MD;  Location: [facility];  Service: Vascular;  Laterality: Right;  . Application of wound vac Right 04/25/2016    Procedure: APPLICATION OF WOUND VAC;  Surgeon: Katha Cabal, MD;  Location: [facility];  Service: Vascular;  Laterality: Right;  . Amputation Right 04/25/2016    Procedure: TOE AMPUTATION;  Surgeon: Katha Cabal, MD;  Location: [facility];  Service: Vascular;  Laterality: Right;  . Peripheral vascular catheterization Right 04/27/2016    Procedure:  Lower Extremity Angiography;  Surgeon: Katha Cabal, MD;  Location: [facility];  Service: Cardiovascular;  Laterality: Right;  . Peripheral vascular catheterization  04/27/2016    Procedure: Lower Extremity Intervention;  Surgeon: Katha Cabal, MD;  Location: [facility];  Service: Cardiovascular;;  . Cardiac catheterization N/A 04/30/2016    Procedure: Left Heart Cath and Coronary Angiography;  Surgeon: Wellington Hampshire, MD;  Location: [facility];  Service: Cardiovascular;  Laterality: N/A;  . Transmetatarsal amputation Right 04/28/2016    Procedure: TRANSMETATARSAL AMPUTATION;  Surgeon: Albertine Patricia, DPM;  Location: [facility];  Service: Podiatry;  Laterality: Right;    in for   No chief complaint on file.    HPI  [name]  is a 55 y.o. male status post transmetatarsal amputation on 04/28/2016.. Coated during his surgery is about has been in the CCU for the last several days. He is more stable to juncture and is in room 258 at this point and plan on discharging him to rehabilitation facility [facility].    Social History Social History  Substance Use Topics  . Smoking status: Never Smoker   . Smokeless tobacco: Not on file  . Alcohol Use: No   Family History Family History  Problem Relation Age of Onset  .  Prostate cancer Mother   . Diabetes Father      Prior to Admission medications   Medication Sig Start Date End Date Taking? Authorizing Provider  sevelamer carbonate (RENVELA) 800 MG tablet Take 800 mg by mouth 3 (three) times daily with meals.   Yes Historical Provider, MD  aspirin 325 MG tablet Take 325 mg by mouth daily.    Historical Provider, MD  cinacalcet (SENSIPAR) 30 MG tablet Take 60 mg by mouth daily.     Historical Provider, MD  glipiZIDE (GLUCOTROL) 10 MG tablet Take 10 mg by mouth daily before breakfast.    Historical Provider, MD  midodrine (PROAMATINE) 10 MG tablet Take 10 mg by mouth 3 (three) times  daily.    Historical Provider, MD    Anti-infectives    Start     Dose/Rate Route Frequency Ordered Stop   05/01/16 1800  meropenem (MERREM) 500 mg in sodium chloride 0.9 % 50 mL IVPB     500 mg 100 mL/hr over 30 Minutes Intravenous Every 24 hours 04/30/16 1609     04/30/16 1415  meropenem (MERREM) 500 mg in sodium chloride 0.9 % 50 mL IVPB  Status:  Discontinued     500 mg 100 mL/hr over 30 Minutes Intravenous Every 24 hours 04/30/16 1402 04/30/16 1413   04/30/16 1415  meropenem (MERREM) 500 mg in sodium chloride 0.9 % 50 mL IVPB  Status:  Discontinued     500 mg 100 mL/hr over 30 Minutes Intravenous Every 24 hours 04/30/16 1413 04/30/16 1609   04/30/16 1345  imipenem-cilastatin (PRIMAXIN) 500 mg in sodium chloride 0.9 % 100 mL IVPB  Status:  Discontinued     500 mg 200 mL/hr over 30 Minutes Intravenous Every 12 hours 04/30/16 1336 04/30/16 1402   04/29/16 1630  piperacillin-tazobactam (ZOSYN) IVPB 3.375 g  Status:  Discontinued     3.375 g 12.5 mL/hr over 240 Minutes Intravenous Every 12 hours 04/29/16 1613 04/30/16 1336   04/28/16 0915  piperacillin-tazobactam (ZOSYN) IVPB 3.375 g     3.375 g 100 mL/hr over 30 Minutes Intravenous  Once 04/28/16 0944 04/28/16 0925   04/27/16 1733  cefUROXime (ZINACEF) 1.5 g in dextrose 5 % 50 mL IVPB  Status:  Discontinued     1.5 g 100 mL/hr over 30 Minutes Intravenous 30 min pre-op 04/27/16 1733 04/27/16 1834   04/27/16 0000  ceFAZolin (ANCEF) IVPB 1 g/50 mL premix    Comments:  Send with pt to OR   1 g 100 mL/hr over 30 Minutes Intravenous On call 04/26/16 1307 04/28/16 0000   04/26/16 1200  vancomycin (VANCOCIN) IVPB 1000 mg/200 mL premix  Status:  Discontinued     1,000 mg 200 mL/hr over 60 Minutes Intravenous Every T-Th-Sa (Hemodialysis) 04/25/16 1852 04/28/16 1102   04/25/16 1900  vancomycin (VANCOCIN) 2,000 mg in sodium chloride 0.9 % 500 mL IVPB     2,000 mg 250 mL/hr over 120 Minutes Intravenous  Once 04/25/16 1849 04/25/16 2215    04/25/16 1152  ceFAZolin (ANCEF) IVPB 2g/100 mL premix     2 g 200 mL/hr over 30 Minutes Intravenous On call to O.R. 04/25/16 1152 04/25/16 1223      Scheduled Meds: . antiseptic oral rinse  7 mL Mouth Rinse BID  . aspirin EC  81 mg Oral Daily  . atorvastatin  40 mg Oral q1800  . cinacalcet  30 mg Oral Q supper  . famotidine (PEPCID) IV  20 mg Intravenous Q24H  . heparin subcutaneous  5,000 Units Subcutaneous Q8H  . insulin aspart  0-5 Units Subcutaneous QHS  . insulin aspart  0-9 Units Subcutaneous TID WC  . meropenem (MERREM) IV  500 mg Intravenous Q24H  . metoprolol tartrate  25 mg Oral BID  . midodrine  10 mg Oral TID PC  . senna-docusate  2 tablet Oral BID  . sevelamer carbonate  0.8 g Oral TID WC  . sodium chloride flush  3 mL Intravenous Q12H  . sodium chloride flush  3 mL Intravenous Q12H   Continuous Infusions:  PRN Meds:.sodium chloride, sodium chloride, acetaminophen **OR** acetaminophen, bisacodyl, HYDROcodone-acetaminophen, morphine injection, ondansetron **OR** ondansetron (ZOFRAN) IV, polyethylene glycol, sodium chloride flush, sodium chloride flush, sodium phosphate, sorbitol  No Known Allergies  Physical Exam  Vitals  Blood pressure 157/72, pulse 80, temperature 98.8 F (37.1 C), temperature source Oral, resp. rate 20, height 5\' 7"  (1.702 m), weight 119 kg (262 lb 5.6 oz), SpO2 100 %.  Lower Extremity exam:Wound VAC was removed today I'll discontinue the wound VAC and discovered with a dry dressing change every other day. Overall the wound appears stable at this point with no evidence of infection or redness to the flaps. They appear to be viable at this juncture.  Data Review  CBC  Recent Labs Lab 04/28/16 0355 04/28/16 1111 04/29/16 0618 04/30/16 0509 05/01/16 0500  WBC 9.8 15.5* 13.3* 9.6 9.6  HGB 7.9* 7.3* 8.0* 7.4* 7.5*  HCT 25.2* 23.9* 26.0* 23.5* 23.5*  PLT 279 300 251 210 244  MCV 87.9 87.3 89.0 89.5 87.8  MCH 27.5 26.8 27.4 28.1 28.0   MCHC 31.2* 30.7* 30.8* 31.4* 31.9*  RDW 21.7* 22.5* 21.2* 21.1* 21.2*  LYMPHSABS  --   --   --   --  1.3  MONOABS  --   --   --   --  1.0  EOSABS  --   --   --   --  1.1*  BASOSABS  --   --   --   --  0.1   ------------------------------------------------------------------------------------------------------------------  Chemistries   Recent Labs Lab 04/28/16 0355 04/28/16 1111 04/29/16 0618 04/30/16 0010 05/01/16 0500  NA 140 138 139 139 138  K 5.0 5.2* 5.6* 4.4 4.5  CL 99* 99* 100* 98* 98*  CO2 30 29 29  32 30  GLUCOSE 162* 183* 128* 145* 105*  BUN 52* 57* 40* 30* 40*  CREATININE 7.16* 7.59* 6.33* 4.58* 6.26*  CALCIUM 8.5* 8.0* 8.8* 7.9* 8.3*  MG  --  2.4  --  2.2  --   AST  --  23  --   --   --   ALT  --  8*  --   --   --   ALKPHOS  --  245*  --   --   --   BILITOT  --  0.5  --   --   --    ------------------- ---------------Assessment & Plan: Foot appears to be stable at this point will still struggled a heal because of low arterial flow but hopefully will progress. Currently the plantar and dorsal flaps looked fairly healthy the incision margin is coapted well. Remove the wound VAC today and area appears stable. Plan: DC the wound VAC and start to dry dressings with heavy padding every other day. Recommend complete nonweightbearing likely for the next 3-4 weeks until this has chance to heal more. We'll need to see him in 1 week to check on progress and keep up with his healing status  to the right transmetatarsal amputation site. Wound culture showed Escherichia coli and Pseudomonas needs infectious disease input for antibiotic selection. Would recommend he continue to stay on antibiotic therapy for likely the next several weeks.   Active Problems:   Atherosclerosis of extremity with gangrene [facility])   Cardiac arrest [facility])     Family Communication: Plan discussed with patient afamily.  [name] M.D on 05/01/2016 at 6:00 PM

## 2016-05-01 NOTE — Progress Notes (Signed)
Hemodialysis completed. 

## 2016-05-01 NOTE — Progress Notes (Signed)
Subjective:   Patient seen during dialysis Tolerating well   HEMODIALYSIS FLOWSHEET:  Blood Flow Rate (mL/min): 400 mL/min Arterial Pressure (mmHg): -180 mmHg Venous Pressure (mmHg): 180 mmHg Transmembrane Pressure (mmHg): 50 mmHg Ultrafiltration Rate (mL/min): 430 mL/min Dialysate Flow Rate (mL/min): 800 ml/min Conductivity: Machine : 14.2 Conductivity: Machine : 14.2 Dialysis Fluid Bolus: Normal Saline Bolus Amount (mL): 250 mL Dialysate Change: 2K Intra-Hemodialysis Comments: 1511ml...tx completed.    Objective:  Vital signs in last 24 hours:  Temp:  [97.3 F (36.3 C)-98.8 F (37.1 C)] 98.8 F (37.1 C) (07/18 1450) Pulse Rate:  [68-80] 80 (07/18 1450) Resp:  [16-34] 20 (07/18 1450) BP: (118-165)/(64-94) 157/72 mmHg (07/18 1450) SpO2:  [84 %-100 %] 100 % (07/18 1450) Weight:  [119 kg (262 lb 5.6 oz)-120.657 kg (266 lb)] 119 kg (262 lb 5.6 oz) (07/18 1429)  Weight change: 6.257 kg (13 lb 12.7 oz) Filed Weights   05/01/16 0542 05/01/16 1040 05/01/16 1429  Weight: 120.652 kg (265 lb 15.8 oz) 120.652 kg (265 lb 15.8 oz) 119 kg (262 lb 5.6 oz)    Intake/Output:    Intake/Output Summary (Last 24 hours) at 05/01/16 1657 Last data filed at 05/01/16 1429  Gross per 24 hour  Intake    120 ml  Output   1000 ml  Net   -880 ml     Physical Exam: General: No acute distress, laying in the bed  HEENT Anicteric, moist mucous membranes  Neck supple  Pulm/lungs normal breathing effort, clear to auscultation bilaterally  CVS/Heart regular rhythm no rub or gallop  Abdomen:  Soft nontender  Extremities: Left BKA, Rt foot wound vac  Neurologic: alert, oriented  Skin: No acute rashes          Basic Metabolic Panel:   Recent Labs Lab 04/28/16 0355 04/28/16 1111 04/29/16 0618 04/30/16 0010 05/01/16 0500  NA 140 138 139 139 138  K 5.0 5.2* 5.6* 4.4 4.5  CL 99* 99* 100* 98* 98*  CO2 30 29 29  32 30  GLUCOSE 162* 183* 128* 145* 105*  BUN 52* 57* 40* 30* 40*   CREATININE 7.16* 7.59* 6.33* 4.58* 6.26*  CALCIUM 8.5* 8.0* 8.8* 7.9* 8.3*  MG  --  2.4  --  2.2  --   PHOS  --  6.4*  --   --   --      CBC:  Recent Labs Lab 04/28/16 0355 04/28/16 1111 04/29/16 0618 04/30/16 0509 05/01/16 0500  WBC 9.8 15.5* 13.3* 9.6 9.6  NEUTROABS  --   --   --   --  6.1  HGB 7.9* 7.3* 8.0* 7.4* 7.5*  HCT 25.2* 23.9* 26.0* 23.5* 23.5*  MCV 87.9 87.3 89.0 89.5 87.8  PLT 279 300 251 210 244      Microbiology:  Recent Results (from the past 720 hour(s))  Surgical pcr screen     Status: None   Collection Time: 04/23/16  3:03 PM  Result Value Ref Range Status   MRSA, PCR NEGATIVE NEGATIVE Final   Staphylococcus aureus NEGATIVE NEGATIVE Final    Comment:        The Xpert SA Assay (FDA approved for NASAL specimens in patients over 69 years of age), is one component of a comprehensive surveillance program.  Test performance has been validated by [facility] for patients greater than or equal to 69 year old. It is not intended to diagnose infection nor to guide or monitor treatment.   Aerobic/Anaerobic Culture (surgical/deep wound)  Status: None (Preliminary result)   Collection Time: 04/28/16  9:02 AM  Result Value Ref Range Status   Specimen Description WOUND RIGHT FOOT  Final   Special Requests POF VANCOMYCIN AND ZINACEF  Final   Gram Stain   Final    NO WBC SEEN RARE GRAM VARIABLE ROD Performed at [facility]    Culture   Final    ABUNDANT ESCHERICHIA COLI Confirmed Extended Spectrum Beta-Lactamase Producer (ESBL) ABUNDANT PSEUDOMONAS AERUGINOSA NO ANAEROBES ISOLATED; CULTURE IN PROGRESS FOR 5 DAYS    Report Status PENDING  Incomplete   Organism ID, Bacteria ESCHERICHIA COLI  Final   Organism ID, Bacteria PSEUDOMONAS AERUGINOSA  Final      Susceptibility   Escherichia coli - MIC*    AMPICILLIN >=32 RESISTANT Resistant     CEFAZOLIN >=64 RESISTANT Resistant     CEFEPIME >=64 RESISTANT Resistant     CEFTAZIDIME >=64  RESISTANT Resistant     CEFTRIAXONE >=64 RESISTANT Resistant     CIPROFLOXACIN >=4 RESISTANT Resistant     GENTAMICIN >=16 RESISTANT Resistant     IMIPENEM <=0.25 SENSITIVE Sensitive     TRIMETH/SULFA <=20 SENSITIVE Sensitive     AMPICILLIN/SULBACTAM >=32 RESISTANT Resistant     PIP/TAZO 8 SENSITIVE Sensitive     Extended ESBL POSITIVE Resistant     * ABUNDANT ESCHERICHIA COLI   Pseudomonas aeruginosa - MIC*    CEFTAZIDIME 4 SENSITIVE Sensitive     CIPROFLOXACIN >=4 RESISTANT Resistant     GENTAMICIN 4 SENSITIVE Sensitive     IMIPENEM 2 SENSITIVE Sensitive     PIP/TAZO <=4 SENSITIVE Sensitive     CEFEPIME 8 SENSITIVE Sensitive     * ABUNDANT PSEUDOMONAS AERUGINOSA  MRSA PCR Screening     Status: None   Collection Time: 04/28/16 10:06 AM  Result Value Ref Range Status   MRSA by PCR NEGATIVE NEGATIVE Final    Comment:        The GeneXpert MRSA Assay (FDA approved for NASAL specimens only), is one component of a comprehensive MRSA colonization surveillance program. It is not intended to diagnose MRSA infection nor to guide or monitor treatment for MRSA infections.     Coagulation Studies:  Recent Labs  04/30/16 0616  LABPROT 17.9*  INR 1.47    Urinalysis: No results for input(s): COLORURINE, LABSPEC, PHURINE, GLUCOSEU, HGBUR, BILIRUBINUR, KETONESUR, PROTEINUR, UROBILINOGEN, NITRITE, LEUKOCYTESUR in the last 72 hours.  Invalid input(s): APPERANCEUR    Imaging: Dg Chest Port 1 View  05/01/2016  CLINICAL DATA:  Patient with cough.  Unstable angina. EXAM: PORTABLE CHEST 1 VIEW COMPARISON:  Chest radiograph 04/29/2016. FINDINGS: Central venous catheter tip projects over the superior cavoatrial junction. Interval extubation and removal of enteric tube. Multiple monitoring leads overlie the patient. Stable cardiomegaly. Aortic vascular calcification. Low lung volumes. Minimal basilar heterogeneous opacities. No pleural effusion or pneumothorax. IMPRESSION: Interval  extubation and removal of enteric tube. Cardiomegaly. Basilar atelectasis. Aortic vascular calcification. Electronically Signed   By: Lovey Newcomer M.D.   On: 05/01/2016 10:00     Medications:     . antiseptic oral rinse  7 mL Mouth Rinse BID  . aspirin EC  81 mg Oral Daily  . atorvastatin  40 mg Oral q1800  . cinacalcet  30 mg Oral Q supper  . famotidine (PEPCID) IV  20 mg Intravenous Q24H  . heparin subcutaneous  5,000 Units Subcutaneous Q8H  . insulin aspart  0-5 Units Subcutaneous QHS  . insulin aspart  0-9 Units Subcutaneous  TID WC  . meropenem (MERREM) IV  500 mg Intravenous Q24H  . metoprolol tartrate  25 mg Oral BID  . midodrine  10 mg Oral TID PC  . senna-docusate  2 tablet Oral BID  . sevelamer carbonate  0.8 g Oral TID WC  . sodium chloride flush  3 mL Intravenous Q12H  . sodium chloride flush  3 mL Intravenous Q12H   sodium chloride, sodium chloride, acetaminophen **OR** acetaminophen, bisacodyl, HYDROcodone-acetaminophen, morphine injection, ondansetron **OR** ondansetron (ZOFRAN) IV, polyethylene glycol, sodium chloride flush, sodium chloride flush, sodium phosphate, sorbitol  Assessment/ Plan:  55 y.o. male with end-stage renal disease, dialysis T-T-S, diabetes, severe peripheral vascular disease, left BKA,admitted for infection in the right foot  1.  End-stage renal disease Patient seen on dialysis Tolerating well Ultrafiltration as tolerated  2.  Anemia of chronic kidney disease Hemoglobin low at 7.5 Will order Procrit to be given subcutaneous  3.  Secondary hyperparathyroidism Phosphorus is high Currently getting sevelamer  4.  Right foot infection Currently on meropenem IV Status post transmetatarsal amputation7-15  Now has wound VAC Cultures growing ESBL Escherichia coli and Pseudomonas   LOS: 6 Benno Brensinger 7/18/20174:57 PM

## 2016-05-01 NOTE — Progress Notes (Signed)
PT Cancellation Note  Patient Details Name: [name] MRN: [id number] DOB: Jun 08, 1961   Cancelled Treatment:    Reason Eval/Treat Not Completed: Patient at procedure or test/unavailable (Consult received and chart reviewed.  Patient currently off unit for dialysis.  Will re-attempt at later time/date as patient available and medically appropriate.)   Zeth Buday H. Owens Shark, PT, DPT, NCS 05/01/2016, 11:16 AM [phone]

## 2016-05-02 ENCOUNTER — Inpatient Hospital Stay: Payer: Medicare Other

## 2016-05-02 LAB — AEROBIC/ANAEROBIC CULTURE W GRAM STAIN (SURGICAL/DEEP WOUND): Gram Stain: NONE SEEN

## 2016-05-02 LAB — GLUCOSE, CAPILLARY
GLUCOSE-CAPILLARY: 128 mg/dL — AB (ref 65–99)
Glucose-Capillary: 124 mg/dL — ABNORMAL HIGH (ref 65–99)
Glucose-Capillary: 140 mg/dL — ABNORMAL HIGH (ref 65–99)

## 2016-05-02 LAB — AEROBIC/ANAEROBIC CULTURE (SURGICAL/DEEP WOUND)

## 2016-05-02 MED ORDER — SODIUM CHLORIDE 0.9 % IV SOLN: 500.0000 mg | INTRAVENOUS | Status: DC

## 2016-05-02 MED ORDER — METOPROLOL TARTRATE 25 MG PO TABS: 25.0000 mg | ORAL_TABLET | Freq: Two times a day (BID) | ORAL | Status: DC

## 2016-05-02 MED ORDER — POLYETHYLENE GLYCOL 3350 17 G PO PACK: 17.0000 g | PACK | Freq: Every day | ORAL | Status: DC | PRN

## 2016-05-02 MED ORDER — SENNOSIDES-DOCUSATE SODIUM 8.6-50 MG PO TABS: 2.0000 | ORAL_TABLET | Freq: Two times a day (BID) | ORAL | Status: DC

## 2016-05-02 MED ORDER — ATORVASTATIN CALCIUM 40 MG PO TABS: 40.0000 mg | ORAL_TABLET | Freq: Every day | ORAL | Status: DC

## 2016-05-02 MED ORDER — ASPIRIN 81 MG PO TBEC: 81.0000 mg | DELAYED_RELEASE_TABLET | Freq: Every day | ORAL | Status: DC

## 2016-05-02 MED ORDER — BENZONATATE 100 MG PO CAPS: 100.0000 mg | ORAL_CAPSULE | Freq: Three times a day (TID) | ORAL | Status: DC

## 2016-05-02 MED ORDER — HYDROCODONE-ACETAMINOPHEN 5-325 MG PO TABS: 1.0000 | ORAL_TABLET | Freq: Four times a day (QID) | ORAL | Status: DC | PRN

## 2016-05-02 MED ORDER — BENZONATATE 100 MG PO CAPS
100.0000 mg | ORAL_CAPSULE | Freq: Three times a day (TID) | ORAL | Status: DC
  Administered 2016-05-02 (×2): 100 mg via ORAL
  Filled 2016-05-02 (×2): qty 1

## 2016-05-02 NOTE — Discharge Summary (Addendum)
[facility] [place] NAME: [name]    MR#:  [id number]  DATE OF BIRTH:  [birth year]  DATE OF ADMISSION:  04/25/2016 ADMITTING PHYSICIAN: Katha Cabal, MD  DATE OF DISCHARGE: 05/02/2016  PRIMARY CARE PHYSICIAN: PROVIDER NOT IN SYSTEM    ADMISSION DIAGNOSIS:  ASO W GANGRENE RT lower extremity angio    ASO w gangrene N applicable unstable angina  DISCHARGE DIAGNOSIS:  Active Problems:   Atherosclerosis of extremity with gangrene ([place])   Cardiac arrest ([place])   SECONDARY DIAGNOSIS:   Past Medical History  Diagnosis Date  . Chronic kidney disease   . Hypertension   . Diabetes mellitus without complication ([place])   . Hypercholesteremia   . Atherosclerosis   . Stroke ([place])   . Anemia     HOSPITAL COURSE:   55 year old male with past medical history significant for end-stage renal disease on Tuesday Thursday Saturday hemodialysis, diabetes mellitus, peripheral vascular disease status post left BKA in the past admitted with necrosis of right foot second, third and fourth toes.  #1 Right foot necrotic toes-  -status post excisional debridement of second third and fourth toes on 04/25/2016.Status post angiogram done to improve blood flow to the right leg on 04/27/2016 - s/p transmetatarsal amputation with clean healthy margins now on 04/28/16- had a wound vac in place- removed yesterday -Appreciate vascular and podiatry consults. - complete non weight bearing of the right foot for the next 3-4 weeks - intra-op cultures growing ESBL E.coli and pseudomonas- on meropenem,   - PICC line and 2 weeks of IV meropenem from the surgery day.  #2 CAD- s/p cardiac cath showing occlusive RCA with collaterals, also ischemic cardiomyopathy EF 30-35%- medical management recommended - Appreciate cardiology consult - Aspirin, Statin and metoprolol added.  #3 end-stage renal disease on hemodialysis-on Tuesday, Thursday and Saturday  schedule. Nephrology consulted. -dialysis tomorrow -Also takes midodrine at home  #4 diabetes mellitus-A1c is only 5.6. Sugars are decently controlled. Discontinue glipizide  #5 PVD-improved blood flow to right leg after angiogram and angioplasty -Continue aspirin.  #6 acute on chronic anemia-anemia of chronic kidney disease. No transfusion indicated unless hemoglobin less than 7. -Monitor at this time. Epogen with dialysis.   For discharge to [facility] today    DISCHARGE CONDITIONS:   Guarded  CONSULTS OBTAINED:  Treatment Team:  Lavonia Dana, MD Albertine Patricia, DPM Loletha Grayer, MD Wellington Hampshire, MD  DRUG ALLERGIES:  No Known Allergies  DISCHARGE MEDICATIONS:   Current Discharge Medication List    START taking these medications   Details  aspirin EC 81 MG EC tablet Take 1 tablet (81 mg total) by mouth daily. Qty: 30 tablet, Refills: 0    atorvastatin (LIPITOR) 40 MG tablet Take 1 tablet (40 mg total) by mouth daily at 6 PM. Qty: 30 tablet, Refills: 2    benzonatate (TESSALON) 100 MG capsule Take 1 capsule (100 mg total) by mouth 3 (three) times daily. Qty: 20 capsule, Refills: 0    HYDROcodone-acetaminophen (NORCO/VICODIN) 5-325 MG tablet Take 1-2 tablets by mouth every 6 (six) hours as needed for moderate pain or severe pain. Qty: 20 tablet, Refills: 0    metoprolol tartrate (LOPRESSOR) 25 MG tablet Take 1 tablet (25 mg total) by mouth 2 (two) times daily. Qty: 60 tablet, Refills: 2    polyethylene glycol (MIRALAX / GLYCOLAX) packet Take 17 g by mouth daily as needed for mild constipation. Qty: 14 each, Refills: 0  senna-docusate (SENOKOT-S) 8.6-50 MG tablet Take 2 tablets by mouth 2 (two) times daily. Qty: 60 tablet, Refills: 2      CONTINUE these medications which have NOT CHANGED   Details  sevelamer carbonate (RENVELA) 800 MG tablet Take 800 mg by mouth 3 (three) times daily with meals.    cinacalcet (SENSIPAR) 30 MG tablet Take  60 mg by mouth daily.     midodrine (PROAMATINE) 10 MG tablet Take 10 mg by mouth 3 (three) times daily.      STOP taking these medications     aspirin 325 MG tablet      glipiZIDE (GLUCOTROL) 10 MG tablet          DISCHARGE INSTRUCTIONS:   1. Podiatry follow-up in 1 week 2. PCP follow-up in 2 weeks 3. For dialysis tomorrow per schedule 4. Complete non-weight bearing of the right leg for almost 4 weeks  If you experience worsening of your admission symptoms, develop shortness of breath, life threatening emergency, suicidal or homicidal thoughts you must seek medical attention immediately by calling 911 or calling your MD immediately  if symptoms less severe.  You Must read complete instructions/literature along with all the possible adverse reactions/side effects for all the Medicines you take and that have been prescribed to you. Take any new Medicines after you have completely understood and accept all the possible adverse reactions/side effects.   Please note  You were cared for by a hospitalist during your hospital stay. If you have any questions about your discharge medications or the care you received while you were in the hospital after you are discharged, you can call the unit and asked to speak with the hospitalist on call if the hospitalist that took care of you is not available. Once you are discharged, your primary care physician will handle any further medical issues. Please note that NO REFILLS for any discharge medications will be authorized once you are discharged, as it is imperative that you return to your primary care physician (or establish a relationship with a primary care physician if you do not have one) for your aftercare needs so that they can reassess your need for medications and monitor your lab values.    Today   CHIEF COMPLAINT:  No chief complaint on file.   VITAL SIGNS:  Blood pressure 151/80, pulse 74, temperature 98.1 F (36.7 C), temperature  source Oral, resp. rate 18, height 5\' 7"  (1.702 m), weight 120.234 kg (265 lb 1.1 oz), SpO2 95 %.  I/O:   Intake/Output Summary (Last 24 hours) at 05/02/16 1041 Last data filed at 05/02/16 0419  Gross per 24 hour  Intake     50 ml  Output   1000 ml  Net   -950 ml    PHYSICAL EXAMINATION:   Physical Exam  GENERAL: 55 y.o.-year-old patient lying in the bed with no acute distress. EYES: Pupils equal, round, reactive to light and accommodation. No scleral icterus. Extraocular muscles intact.  HEENT: Head atraumatic, normocephalic. Oropharynx and nasopharynx clear.  NECK: Supple, no jugular venous distention. No thyroid enlargement, no tenderness.  LUNGS: Normal breath sounds bilaterally, no wheezing, rales,rhonchi or crepitation. No use of accessory muscles of respiration.fine bibasilar crackles present.  CARDIOVASCULAR: S1, S2 normal. No murmurs, rubs, or gallops.  ABDOMEN: Soft, nontender, nondistended. Bowel sounds present. No organomegaly or mass.  EXTREMITIES: Left BKA, right foot transmetatarsal amputation and dressing in place.. Left arm AV fistula present NEUROLOGIC: Cranial nerves II through XII are intact. Muscle strength  5/5 in all extremities except limited in right foot due to recent surgery. Left leg is status post BKA. Sensation intact. Gait not checked.  PSYCHIATRIC: The patient is alert and oriented x 3.  SKIN: No obvious rash, lesion, or ulcer.   DATA REVIEW:   CBC  Recent Labs Lab 05/01/16 0500  WBC 9.6  HGB 7.5*  HCT 23.5*  PLT 244    Chemistries   Recent Labs Lab 04/28/16 1111  04/30/16 0010 05/01/16 0500  NA 138  < > 139 138  K 5.2*  < > 4.4 4.5  CL 99*  < > 98* 98*  CO2 29  < > 32 30  GLUCOSE 183*  < > 145* 105*  BUN 57*  < > 30* 40*  CREATININE 7.59*  < > 4.58* 6.26*  CALCIUM 8.0*  < > 7.9* 8.3*  MG 2.4  --  2.2  --   AST 23  --   --   --   ALT 8*  --   --   --   ALKPHOS 245*  --   --   --   BILITOT 0.5  --   --   --   < > =  values in this interval not displayed.  Cardiac Enzymes  Recent Labs Lab 04/29/16 1756  TROPONINI 0.07*    Microbiology Results  Results for orders placed or performed during the hospital encounter of 04/25/16  Aerobic/Anaerobic Culture (surgical/deep wound)     Status: None   Collection Time: 04/28/16  9:02 AM  Result Value Ref Range Status   Specimen Description WOUND RIGHT FOOT  Final   Special Requests POF VANCOMYCIN AND ZINACEF  Final   Gram Stain NO WBC SEEN RARE GRAM VARIABLE ROD   Final   Culture   Final    ABUNDANT ESCHERICHIA COLI Confirmed Extended Spectrum Beta-Lactamase Producer (ESBL) ABUNDANT PSEUDOMONAS AERUGINOSA NO ANAEROBES ISOLATED Performed at [facility]    Report Status 05/02/2016 FINAL  Final   Organism ID, Bacteria ESCHERICHIA COLI  Final   Organism ID, Bacteria PSEUDOMONAS AERUGINOSA  Final      Susceptibility   Escherichia coli - MIC*    AMPICILLIN >=32 RESISTANT Resistant     CEFAZOLIN >=64 RESISTANT Resistant     CEFEPIME >=64 RESISTANT Resistant     CEFTAZIDIME >=64 RESISTANT Resistant     CEFTRIAXONE >=64 RESISTANT Resistant     CIPROFLOXACIN >=4 RESISTANT Resistant     GENTAMICIN >=16 RESISTANT Resistant     IMIPENEM <=0.25 SENSITIVE Sensitive     TRIMETH/SULFA <=20 SENSITIVE Sensitive     AMPICILLIN/SULBACTAM >=32 RESISTANT Resistant     PIP/TAZO 8 SENSITIVE Sensitive     Extended ESBL POSITIVE Resistant     * ABUNDANT ESCHERICHIA COLI   Pseudomonas aeruginosa - MIC*    CEFTAZIDIME 4 SENSITIVE Sensitive     CIPROFLOXACIN >=4 RESISTANT Resistant     GENTAMICIN 4 SENSITIVE Sensitive     IMIPENEM 2 SENSITIVE Sensitive     PIP/TAZO <=4 SENSITIVE Sensitive     CEFEPIME 8 SENSITIVE Sensitive     * ABUNDANT PSEUDOMONAS AERUGINOSA  MRSA PCR Screening     Status: None   Collection Time: 04/28/16 10:06 AM  Result Value Ref Range Status   MRSA by PCR NEGATIVE NEGATIVE Final    Comment:        The GeneXpert MRSA Assay  (FDA approved for NASAL specimens only), is one component of a comprehensive MRSA colonization surveillance program. It  is not intended to diagnose MRSA infection nor to guide or monitor treatment for MRSA infections.     RADIOLOGY:  Dg Chest Port 1 View  05/01/2016  CLINICAL DATA:  Patient with cough.  Unstable angina. EXAM: PORTABLE CHEST 1 VIEW COMPARISON:  Chest radiograph 04/29/2016. FINDINGS: Central venous catheter tip projects over the superior cavoatrial junction. Interval extubation and removal of enteric tube. Multiple monitoring leads overlie the patient. Stable cardiomegaly. Aortic vascular calcification. Low lung volumes. Minimal basilar heterogeneous opacities. No pleural effusion or pneumothorax. IMPRESSION: Interval extubation and removal of enteric tube. Cardiomegaly. Basilar atelectasis. Aortic vascular calcification. Electronically Signed   By: Lovey Newcomer M.D.   On: 05/01/2016 10:00    EKG:   Orders placed or performed during the hospital encounter of 04/25/16  . EKG 12-Lead  . EKG 12-Lead  . EKG 12-Lead  . EKG 12-Lead      Management plans discussed with the patient, family and they are in agreement.  CODE STATUS:     Code Status Orders        Start     Ordered   04/28/16 1030  Full code   Continuous     04/28/16 1034    Code Status History    Date Active Date Inactive Code Status Order ID Comments User Context   04/25/2016  1:40 PM 04/28/2016 10:34 AM Full Code [id number]  Katha Cabal, MD Inpatient      TOTAL TIME TAKING CARE OF THIS PATIENT: 38 minutes.    Gladstone Lighter M.D on 05/02/2016 at 10:41 AM  Between 7am to 6pm - Pager - [phone]  After 6pm go to [email or web address] - password [email or web address] [facility]  Office  [phone]  CC: Primary care physician; PROVIDER NOT IN SYSTEM

## 2016-05-02 NOTE — Progress Notes (Signed)
Patient picked up by EMS. [name]

## 2016-05-02 NOTE — Progress Notes (Signed)
Transport explained to patient and family by interpreter, Kennyth Lose, with SW and myself present. Family and patient agrees to EMS. EMS transport called. Attempted to call report to [facility], stated that they will call back. Wilnette Kales

## 2016-05-02 NOTE — Evaluation (Signed)
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 Patient has history of DM, PVD. He has an old L BKA, has been using a prosthesis with for ambulation up until this time.   Clinical Impression  Patient has a complex medical and mobility history. It appears he was ambulating short distances prior to this admission (around [place]) with prosthetic for L BKA as well as RW. However, he is now NWB on RLE. Patient is able to complete bed mobility with min guard-min A for balance, and is able to don/doff prosthetic with min guard-min A from sister. PT offered to have patient stand, family was concerned regarding potential fall, patient not understanding WB status on RLE, etc. PT deferred further mobility given the family's concerns and fatigue of patient. Patient is unable to return home physically currently and would benefit from SNF at discharge to increase independence with mobility.    Follow Up Recommendations SNF    Equipment Recommendations       Recommendations for Other Services       Precautions / Restrictions Precautions Precautions: Fall Required Braces or Orthoses:  (Prosthetic for L BKA) Restrictions Weight Bearing Restrictions: Yes RLE Weight Bearing: Non weight bearing      Mobility  Bed Mobility Overal bed mobility: Needs Assistance Bed Mobility: Supine to Sit;Sit to Supine     Supine to sit: Min guard;Min assist Sit to supine: Min guard;Min assist   General bed mobility comments: Patient is fairly independent with bed mobility via use of UEs.  Transfers                    Ambulation/Gait                Stairs            Wheelchair Mobility    Modified Rankin (Stroke Patients Only)       Balance Overall balance assessment: Needs  assistance Sitting-balance support: Bilateral upper extremity supported Sitting balance-Leahy Scale: Fair     Standing balance support: Bilateral upper extremity supported                                 Pertinent Vitals/Pain Pain Assessment: No/denies pain    Home Living Family/patient expects to be discharged to:: Skilled nursing facility Living Arrangements: Other relatives Available Help at Discharge: Family Type of Home: House Home Access: Stairs to enter   [employer] of Steps: 5 Home Layout: One level Home Equipment: Walker - 2 wheels;Cane - single point      Prior Function Level of Independence: Independent with assistive device(s)         Comments: Patient has been a limited ambulator with RW prior to this admission with prosthesis.      Hand Dominance        Extremity/Trunk Assessment   Upper Extremity Assessment: Overall WFL for tasks assessed           Lower Extremity Assessment: RLE deficits/detail;LLE deficits/detail RLE Deficits / Details: Able to complete bed mobility, educated to maintain NWB.  LLE Deficits / Details: Able to don/doff prosthesis with assistance from sister.      Communication   Communication: Interpreter utilized;Prefers language other than English (Spanish)  Cognition Arousal/Alertness: Awake/alert Behavior During  Therapy: WFL for tasks assessed/performed Overall Cognitive Status: Within Functional Limits for tasks assessed                      General Comments General comments (skin integrity, edema, etc.): Dressing/bandage on RLE, L stump does not show signs of cellulitis    Exercises        Assessment/Plan    PT Assessment Patient needs continued PT services  PT Diagnosis Difficulty walking;Generalized weakness;Abnormality of gait   PT Problem List Decreased strength;Decreased knowledge of use of DME;Decreased safety awareness;Decreased balance;Decreased mobility;Decreased  knowledge of precautions;Decreased activity tolerance  PT Treatment Interventions DME instruction;Gait training;Stair training;Therapeutic activities;Wheelchair mobility training;Therapeutic exercise;Balance training   PT Goals (Current goals can be found in the Care Plan section) Acute Rehab PT Goals Patient Stated Goal: To go to rehab facility.  PT Goal Formulation: With patient/family Time For Goal Achievement: 05/16/16 Potential to Achieve Goals: Fair    Frequency Min 2X/week   Barriers to discharge Inaccessible home environment Has steps to get in, currently is NWB on RLE, BKA on LLE     Co-evaluation               End of Session   Activity Tolerance: Patient tolerated treatment well;Patient limited by fatigue Patient left: in bed;with call bell/phone within reach;with bed alarm set;with family/visitor present Nurse Communication: Mobility status         Time: [id number] PT Time Calculation (min) (ACUTE ONLY): 15 min   Charges:   PT Evaluation $PT Eval Moderate Complexity: 1 Procedure     PT G Codes:       Kerman Passey, PT, DPT    05/02/2016, 6:12 PM

## 2016-05-02 NOTE — Progress Notes (Signed)
CSW met with patietn'sf amily with an interpreter. Patient's family had concerns about transport. CSW explained that she can arrange EMS. Patient's family is in agreement but is still concerned about co-pay. CSW explained that the insurance will be billed and they will be sent a bill from EMS. Agreeable for EMS to transport patient. CSW updated Broadus John- Admissions Coordinator at [facility] and updated him on patient's dialysis center and days. EMS form completed. RN updated.   Ernest Pine, MSW, [place], [place] Clinical Social Worker [phone]

## 2016-05-02 NOTE — Progress Notes (Signed)
Report given to [facility] at this time. Wilnette Kales

## 2016-05-02 NOTE — Progress Notes (Signed)
Clinical Social Worker was informed that patient will be medically ready to discharge to [facility]. Patient and family are  in a agreement with plan. CSW called Broadus John- Admissions Coordinator at [facility] to confirm that patient's bed is ready. Provided patient's room number 510 and number to call for report Theressa Stamps [phone]. All discharge information faxed to [facility] via [facility]. Rx's and added to discharge packet.   RN will call report and patient will discharge to [facility] via family transport- will need assistance with getting him in the car.   Ernest Pine, MSW, [place], [place] Clinical Social Worker [phone]

## 2016-05-02 NOTE — Care Management Important Message (Signed)
Important Message  Patient Details  Name: [name] MRN: [id number] Date of Birth: [birth year]   Medicare Important Message Given:  Yes    Jolly Mango, RN 05/02/2016, 9:58 AM

## 2016-05-02 NOTE — Progress Notes (Signed)
PT Cancellation Note  Patient Details Name: [name] MRN: [id number] DOB: 06-Feb-1961   Cancelled Treatment:    Reason Eval/Treat Not Completed: Patient at procedure or test/unavailable. PT contacted interpreter, entered patient's room while he was receiving central line. Had conversation with sister, patient has a prosthetic for R BKA (old), has 5 steps to get in and generally uses RW. Patient is able to complete limited mobility in the community at baseline. PT will re-attempt later this afternoon if available (central line still had some bleeding).   Kerman Passey, PT, DPT   05/02/2016, 2:34 PM

## 2016-05-02 NOTE — Progress Notes (Addendum)
CSW was informed by MD that patient will need IV/ABX. CSW informed [facility]. They are no longer able to accept patient. Met with patient and family with an interpreter. Presented Bed offers. Accepted bed at [facility]. Informed [facility]. He's able to do wound vac and IV/ ABX. Patient's family would like to transport patient. CSW will continue to follow and assist.  Ernest Pine, MSW, [place], [place] Social Worker [phone]

## 2016-05-02 NOTE — Clinical Social Work Placement (Signed)
   CLINICAL SOCIAL WORK PLACEMENT  NOTE  Date:  05/02/2016  Patient Details  Name: [name] MRN: [id number] Date of Birth: 1961-06-25  Clinical Social Work is seeking post-discharge placement for this patient at the [place] level of care (*CSW will initial, date and re-position this form in  chart as items are completed):  Yes   Patient/family provided with [facility] Work Department's list of facilities offering this level of care within the geographic area requested by the patient (or if unable, by the patient's family).  Yes   Patient/family informed of their freedom to choose among providers that offer the needed level of care, that participate in Medicare, Medicaid or managed care program needed by the patient, have an available bed and are willing to accept the patient.  Yes   Patient/family informed of [facility]'s ownership interest in [facility] and [facility], as well as of the fact that they are under no obligation to receive care at these facilities.  PASRR submitted to EDS on 04/27/16     PASRR number received on 04/27/16     Existing PASRR number confirmed on       FL2 transmitted to all facilities in geographic area requested by pt/family on       FL2 transmitted to all facilities within larger geographic area on       Patient informed that his/her managed care company has contracts with or will negotiate with certain facilities, including the following:        Yes   Patient/family informed of bed offers received.  Patient chooses bed at  ([facility])     Physician recommends and patient chooses bed at      Patient to be transferred to  ([facility]) on 05/02/16.  Patient to be transferred to facility by  (Family)     Patient family notified on 05/02/16 of transfer.  Name of family member notified:   (Sister )     PHYSICIAN       Additional Comment:    _______________________________________________ Baldemar Lenis, LCSW 05/02/2016, 2:01 PM

## 2016-05-02 NOTE — Progress Notes (Signed)
Subjective:   Doing well today No c.o N/V No SOB Conversation through [place] interpreter   Objective:  Vital signs in last 24 hours:  Temp:  [98.1 F (36.7 C)-98.8 F (37.1 C)] 98.2 F (36.8 C) (07/19 1106) Pulse Rate:  [69-80] 69 (07/19 1106) Resp:  [16-34] 24 (07/19 1106) BP: (133-165)/(65-94) 139/70 mmHg (07/19 1106) SpO2:  [91 %-100 %] 92 % (07/19 1106) Weight:  [119 kg (262 lb 5.6 oz)-120.234 kg (265 lb 1.1 oz)] 120.234 kg (265 lb 1.1 oz) (07/19 0424)  Weight change: -0.005 kg (-0.2 oz) Filed Weights   05/01/16 1040 05/01/16 1429 05/02/16 0424  Weight: 120.652 kg (265 lb 15.8 oz) 119 kg (262 lb 5.6 oz) 120.234 kg (265 lb 1.1 oz)    Intake/Output:    Intake/Output Summary (Last 24 hours) at 05/02/16 1137 Last data filed at 05/02/16 0900  Gross per 24 hour  Intake     50 ml  Output   1000 ml  Net   -950 ml     Physical Exam: General: No acute distress, laying in the bed  HEENT Anicteric, moist mucous membranes  Neck supple  Pulm/lungs normal breathing effort, clear to auscultation bilaterally  CVS/Heart regular rhythm no rub or gallop  Abdomen:  Soft nontender  Extremities: Left BKA,    Neurologic: alert, oriented  Skin: No acute rashes          Basic Metabolic Panel:   Recent Labs Lab 04/28/16 0355 04/28/16 1111 04/29/16 0618 04/30/16 0010 05/01/16 0500  NA 140 138 139 139 138  K 5.0 5.2* 5.6* 4.4 4.5  CL 99* 99* 100* 98* 98*  CO2 30 29 29  32 30  GLUCOSE 162* 183* 128* 145* 105*  BUN 52* 57* 40* 30* 40*  CREATININE 7.16* 7.59* 6.33* 4.58* 6.26*  CALCIUM 8.5* 8.0* 8.8* 7.9* 8.3*  MG  --  2.4  --  2.2  --   PHOS  --  6.4*  --   --   --      CBC:  Recent Labs Lab 04/28/16 0355 04/28/16 1111 04/29/16 0618 04/30/16 0509 05/01/16 0500  WBC 9.8 15.5* 13.3* 9.6 9.6  NEUTROABS  --   --   --   --  6.1  HGB 7.9* 7.3* 8.0* 7.4* 7.5*  HCT 25.2* 23.9* 26.0* 23.5* 23.5*  MCV 87.9 87.3 89.0 89.5 87.8  PLT 279 300 251 210 244       Microbiology:  Recent Results (from the past 720 hour(s))  Surgical pcr screen     Status: None   Collection Time: 04/23/16  3:03 PM  Result Value Ref Range Status   MRSA, PCR NEGATIVE NEGATIVE Final   Staphylococcus aureus NEGATIVE NEGATIVE Final    Comment:        The Xpert SA Assay (FDA approved for NASAL specimens in patients over 82 years of age), is one component of a comprehensive surveillance program.  Test performance has been validated by [facility] for patients greater than or equal to 60 year old. It is not intended to diagnose infection nor to guide or monitor treatment.   Aerobic/Anaerobic Culture (surgical/deep wound)     Status: None   Collection Time: 04/28/16  9:02 AM  Result Value Ref Range Status   Specimen Description WOUND RIGHT FOOT  Final   Special Requests POF VANCOMYCIN AND ZINACEF  Final   Gram Stain NO WBC SEEN RARE GRAM VARIABLE ROD   Final   Culture   Final  ABUNDANT ESCHERICHIA COLI Confirmed Extended Spectrum Beta-Lactamase Producer (ESBL) ABUNDANT PSEUDOMONAS AERUGINOSA NO ANAEROBES ISOLATED Performed at [facility]    Report Status 05/02/2016 FINAL  Final   Organism ID, Bacteria ESCHERICHIA COLI  Final   Organism ID, Bacteria PSEUDOMONAS AERUGINOSA  Final      Susceptibility   Escherichia coli - MIC*    AMPICILLIN >=32 RESISTANT Resistant     CEFAZOLIN >=64 RESISTANT Resistant     CEFEPIME >=64 RESISTANT Resistant     CEFTAZIDIME >=64 RESISTANT Resistant     CEFTRIAXONE >=64 RESISTANT Resistant     CIPROFLOXACIN >=4 RESISTANT Resistant     GENTAMICIN >=16 RESISTANT Resistant     IMIPENEM <=0.25 SENSITIVE Sensitive     TRIMETH/SULFA <=20 SENSITIVE Sensitive     AMPICILLIN/SULBACTAM >=32 RESISTANT Resistant     PIP/TAZO 8 SENSITIVE Sensitive     Extended ESBL POSITIVE Resistant     * ABUNDANT ESCHERICHIA COLI   Pseudomonas aeruginosa - MIC*    CEFTAZIDIME 4 SENSITIVE Sensitive     CIPROFLOXACIN >=4 RESISTANT  Resistant     GENTAMICIN 4 SENSITIVE Sensitive     IMIPENEM 2 SENSITIVE Sensitive     PIP/TAZO <=4 SENSITIVE Sensitive     CEFEPIME 8 SENSITIVE Sensitive     * ABUNDANT PSEUDOMONAS AERUGINOSA  MRSA PCR Screening     Status: None   Collection Time: 04/28/16 10:06 AM  Result Value Ref Range Status   MRSA by PCR NEGATIVE NEGATIVE Final    Comment:        The GeneXpert MRSA Assay (FDA approved for NASAL specimens only), is one component of a comprehensive MRSA colonization surveillance program. It is not intended to diagnose MRSA infection nor to guide or monitor treatment for MRSA infections.     Coagulation Studies:  Recent Labs  04/30/16 0616  LABPROT 17.9*  INR 1.47    Urinalysis: No results for input(s): COLORURINE, LABSPEC, PHURINE, GLUCOSEU, HGBUR, BILIRUBINUR, KETONESUR, PROTEINUR, UROBILINOGEN, NITRITE, LEUKOCYTESUR in the last 72 hours.  Invalid input(s): APPERANCEUR    Imaging: Dg Chest Port 1 View  05/01/2016  CLINICAL DATA:  Patient with cough.  Unstable angina. EXAM: PORTABLE CHEST 1 VIEW COMPARISON:  Chest radiograph 04/29/2016. FINDINGS: Central venous catheter tip projects over the superior cavoatrial junction. Interval extubation and removal of enteric tube. Multiple monitoring leads overlie the patient. Stable cardiomegaly. Aortic vascular calcification. Low lung volumes. Minimal basilar heterogeneous opacities. No pleural effusion or pneumothorax. IMPRESSION: Interval extubation and removal of enteric tube. Cardiomegaly. Basilar atelectasis. Aortic vascular calcification. Electronically Signed   By: Lovey Newcomer M.D.   On: 05/01/2016 10:00     Medications:     . antiseptic oral rinse  7 mL Mouth Rinse BID  . aspirin EC  81 mg Oral Daily  . atorvastatin  40 mg Oral q1800  . benzonatate  100 mg Oral TID  . cinacalcet  30 mg Oral Q supper  . famotidine (PEPCID) IV  20 mg Intravenous Q24H  . heparin subcutaneous  5,000 Units Subcutaneous Q8H  . insulin  aspart  0-5 Units Subcutaneous QHS  . insulin aspart  0-9 Units Subcutaneous TID WC  . meropenem (MERREM) IV  500 mg Intravenous Q24H  . metoprolol tartrate  25 mg Oral BID  . midodrine  10 mg Oral TID PC  . senna-docusate  2 tablet Oral BID  . sevelamer carbonate  0.8 g Oral TID WC  . sodium chloride flush  10-40 mL Intracatheter Q12H  . sodium  chloride flush  3 mL Intravenous Q12H  . sodium chloride flush  3 mL Intravenous Q12H   sodium chloride, sodium chloride, acetaminophen **OR** acetaminophen, bisacodyl, HYDROcodone-acetaminophen, morphine injection, ondansetron **OR** ondansetron (ZOFRAN) IV, polyethylene glycol, sodium chloride flush, sodium chloride flush, sodium chloride flush, sodium phosphate, sorbitol  Assessment/ Plan:  55 y.o. male with end-stage renal disease, dialysis T-T-S/ [address], diabetes, severe peripheral vascular disease, left BKA,admitted for infection in the right foot  1.  End-stage renal disease Continue routine HD at his home dialysis unit  2.  Anemia of chronic kidney disease Hemoglobin low at 7.5 Continue procrit with HD  3.  Secondary hyperparathyroidism Phosphorus is high Currently getting sevelamer Low phos diet  4.  Right foot infection Currently on meropenem IV Status post transmetatarsal amputation7/15   Cultures growing ESBL Escherichia coli and Pseudomonas 2 weeks of iv ABx via PICC line   LOS: 7 [name] 7/19/201711:37 AM

## 2016-05-05 ENCOUNTER — Emergency Department
Admission: EM | Admit: 2016-05-05 | Discharge: 2016-05-05 | Disposition: A | Discharge status: Home / Self Care | Payer: Medicare Other | Attending: Emergency Medicine | Admitting: Emergency Medicine

## 2016-05-05 ENCOUNTER — Encounter: Payer: Self-pay | Admitting: Emergency Medicine

## 2016-05-05 DIAGNOSIS — T8189XA Other complications of procedures, not elsewhere classified, initial encounter: Secondary | ICD-10-CM | POA: Diagnosis present

## 2016-05-05 DIAGNOSIS — Z8679 Personal history of other diseases of the circulatory system: Secondary | ICD-10-CM | POA: Diagnosis not present

## 2016-05-05 DIAGNOSIS — Y69 Unspecified misadventure during surgical and medical care: Secondary | ICD-10-CM | POA: Insufficient documentation

## 2016-05-05 DIAGNOSIS — N186 End stage renal disease: Secondary | ICD-10-CM | POA: Diagnosis not present

## 2016-05-05 DIAGNOSIS — Z7982 Long term (current) use of aspirin: Secondary | ICD-10-CM | POA: Diagnosis not present

## 2016-05-05 DIAGNOSIS — E1122 Type 2 diabetes mellitus with diabetic chronic kidney disease: Secondary | ICD-10-CM | POA: Diagnosis not present

## 2016-05-05 DIAGNOSIS — T148XXA Other injury of unspecified body region, initial encounter: Secondary | ICD-10-CM

## 2016-05-05 DIAGNOSIS — Z89512 Acquired absence of left leg below knee: Secondary | ICD-10-CM | POA: Insufficient documentation

## 2016-05-05 DIAGNOSIS — Z79899 Other long term (current) drug therapy: Secondary | ICD-10-CM | POA: Insufficient documentation

## 2016-05-05 DIAGNOSIS — Z8673 Personal history of transient ischemic attack (TIA), and cerebral infarction without residual deficits: Secondary | ICD-10-CM | POA: Diagnosis not present

## 2016-05-05 DIAGNOSIS — Z992 Dependence on renal dialysis: Secondary | ICD-10-CM | POA: Insufficient documentation

## 2016-05-05 DIAGNOSIS — I120 Hypertensive chronic kidney disease with stage 5 chronic kidney disease or end stage renal disease: Secondary | ICD-10-CM | POA: Insufficient documentation

## 2016-05-05 MED ORDER — BACITRACIN ZINC 500 UNIT/GM EX OINT
TOPICAL_OINTMENT | Freq: Two times a day (BID) | CUTANEOUS | Status: DC
  Administered 2016-05-05: 1 via TOPICAL
  Filled 2016-05-05: qty 0.9

## 2016-05-05 NOTE — ED Notes (Signed)
Report received 

## 2016-05-05 NOTE — ED Notes (Signed)
Left with [facility] to [facility].

## 2016-05-05 NOTE — ED Provider Notes (Signed)
[facility] Emergency Department Provider Note   ____________________________________________  Time seen: Approximately 11:15am I have reviewed the triage vital signs and the triage nursing note.  HISTORY  Chief Complaint Foot Injury   Historian Patient and spouse through [place] interpreter  HPI [name] is a 55 y.o. male with dm and recent right mid foot amputation (04/28/16 with Dr. Elvina Mattes) here for bleeding from stitches.  He is staying at [facility] and is supposed to be non weight bearing but patient states he was made to stand on it yesterday to transfer.  Today he completed his dialysis and his foot was bumped while being moved.  There was a small amount of bleeding at the stiches site.  No fevers, vomiting, abdominal pain, redness or swelling to the right leg.  No pain to the foot or leg.    Past Medical History  Diagnosis Date  . Chronic kidney disease   . Hypertension   . Diabetes mellitus without complication ([place])   . Hypercholesteremia   . Atherosclerosis   . Stroke ([place])   . Anemia     Patient Active Problem List   Diagnosis Date Noted  . Cardiac arrest ([place]) 04/28/2016  . 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 Av fistula placement Left 2015  . Peripheral vascular catheterization N/A 10/03/2015    Procedure: A/V Shuntogram/Fistulagram;  Surgeon: [name], MD;  Location: [place] CV [facility];  Service: Cardiovascular;  Laterality: N/A;  . Peripheral vascular catheterization N/A 01/16/2016    Procedure: Abdominal Aortogram w/Lower Extremity;  Surgeon: [name], MD;  Location: [place] [facility];  Service: Cardiovascular;  Laterality: N/A;  . Peripheral vascular catheterization  01/16/2016    Procedure: Lower Extremity Intervention;  Surgeon: [name], MD;  Location: [place] [facility];  Service: Cardiovascular;;  . Leg amputation below knee  Left   . Wound debridement Right 04/25/2016    Procedure: DEBRIDEMENT WOUND;  Surgeon: Katha Cabal, MD;  Location: [facility] ORS;  Service: Vascular;  Laterality: Right;  . Application of wound vac Right 04/25/2016    Procedure: APPLICATION OF WOUND VAC;  Surgeon: Katha Cabal, MD;  Location: [facility] ORS;  Service: Vascular;  Laterality: Right;  . Amputation Right 04/25/2016    Procedure: TOE AMPUTATION;  Surgeon: Katha Cabal, MD;  Location: [facility] ORS;  Service: Vascular;  Laterality: Right;  . Peripheral vascular catheterization Right 04/27/2016    Procedure: Lower Extremity Angiography;  Surgeon: Katha Cabal, MD;  Location: [place] [facility];  Service: Cardiovascular;  Laterality: Right;  . Peripheral vascular catheterization  04/27/2016    Procedure: Lower Extremity Intervention;  Surgeon: Katha Cabal, MD;  Location: [place] [facility];  Service: Cardiovascular;;  . Cardiac catheterization N/A 04/30/2016    Procedure: Left Heart Cath and Coronary Angiography;  Surgeon: Wellington Hampshire, MD;  Location: [place] [facility];  Service: Cardiovascular;  Laterality: N/A;  . Transmetatarsal amputation Right 04/28/2016    Procedure: TRANSMETATARSAL AMPUTATION;  Surgeon: Albertine Patricia, DPM;  Location: [facility] OR[facility];  Service: Podiatry;  Laterality: Right;    Current Outpatient Rx  Name  Route  Sig  Dispense  Refill  . aspirin EC 81 MG EC tablet   Oral   Take 1 tablet (81 mg total) by mouth daily.   30 tablet   0   . atorvastatin (LIPITOR) 40 MG tablet  Oral   Take 1 tablet (40 mg total) by mouth daily at 6 PM.   30 tablet   2   . benzonatate (TESSALON) 100 MG capsule   Oral   Take 1 capsule (100 mg total) by mouth 3 (three) times daily.   20 capsule   0   . cinacalcet (SENSIPAR) 30 MG tablet   Oral   Take 60 mg by mouth daily.          Marland Kitchen HYDROcodone-acetaminophen (NORCO/VICODIN) 5-325 MG tablet   Oral   Take 1-2 tablets by mouth every 6 (six) hours as needed  for moderate pain or severe pain.   20 tablet   0   . meropenem 500 mg in sodium chloride 0.9 % 50 mL   Intravenous   Inject 500 mg into the vein daily. X 10 days   5 g   0   . metoprolol tartrate (LOPRESSOR) 25 MG tablet   Oral   Take 1 tablet (25 mg total) by mouth 2 (two) times daily.   60 tablet   2   . midodrine (PROAMATINE) 10 MG tablet   Oral   Take 10 mg by mouth 3 (three) times daily.         . polyethylene glycol (MIRALAX / GLYCOLAX) packet   Oral   Take 17 g by mouth daily as needed for mild constipation.   14 each   0   . senna-docusate (SENOKOT-S) 8.6-50 MG tablet   Oral   Take 2 tablets by mouth 2 (two) times daily.   60 tablet   2   . sevelamer carbonate (RENVELA) 800 MG tablet   Oral   Take 800 mg by mouth 3 (three) times daily with meals.           Allergies Review of patient's allergies indicates no known allergies.  Family History  Problem Relation Age of Onset  . Prostate cancer Mother   . Diabetes Father     Social History Social History  Substance Use Topics  . Smoking status: Never Smoker   . Smokeless tobacco: None  . Alcohol Use: No    Review of Systems  Constitutional: Negative for fever. Eyes: Negative for visual changes. ENT: Negative for sore throat. Cardiovascular: Negative for chest pain. Respiratory: Negative for shortness of breath. Gastrointestinal: Negative for abdominal pain, vomiting and diarrhea. Genitourinary: Negative for dysuria. Musculoskeletal: Negative for back pain. Skin: Negative for rash. Neurological: Negative for headache. 10 point Review of Systems otherwise negative ____________________________________________   PHYSICAL EXAM:  VITAL SIGNS: ED Triage Vitals  Enc Vitals Group     BP 05/05/16 1045 162/90 mmHg     Pulse Rate 05/05/16 1045 67     Resp 05/05/16 1045 18     Temp 05/05/16 1045 98.6 F (37 C)     Temp Source 05/05/16 1045 Oral     SpO2 05/05/16 1045 96 %     Weight  05/05/16 1045 242 lb 8.1 oz (110 kg)     Height 05/05/16 1045 5\' 7"  (1.702 m)     Head Cir --      [facility] --      Pain Score --      Pain Loc --      Pain Edu? --      Excl. in [place]? --      Constitutional: Alert and oriented. Well appearing and in no distress. HEENT   Head: Normocephalic and atraumatic.  Eyes: Conjunctivae are normal. PERRL. Normal extraocular movements.      Ears:         Nose: No congestion/rhinnorhea.   Mouth/Throat: Mucous membranes are moist.  Missing teeth.   Neck: No stridor. Cardiovascular/Chest: Normal rate, regular rhythm.  No murmurs, rubs, or gallops. Respiratory: Normal respiratory effort without tachypnea nor retractions. Breath sounds are clear and equal bilaterally. No wheezes/rales/rhonchi. Gastrointestinal: Soft. No distention, no guarding, no rebound. Nontender.  Obese  Genitourinary/rectal:Deferred Musculoskeletal: Left leg amputation with artificial leg in place.  Right mid-foot amputation with stitches intact.  Two small areas of moist blood along stitches without any active drainage. No foul or yellow drainage, just slight blood. No redness or cellulitis.  No calf pain, mild rle edema trace.   Neurologic:  Normal speech and language. No gross or focal neurologic deficits are appreciated. Skin:  Skin is warm, dry and intact. No rash noted. Psychiatric: Mood and affect are normal. Speech and behavior are normal. Patient exhibits appropriate insight and judgment.  ____________________________________________   EKG I, Lisa Roca, MD, the attending physician have personally viewed and interpreted all ECGs.  Me 1 bpm. Sinus rhythm with frequent PVCs. Normal axis. Nonspecific intraventricular conduction delay. Nonspecific ST and T-wave ____________________________________________  LABS (pertinent positives/negatives)  Labs Reviewed - No data to display  ____________________________________________  RADIOLOGY All Xrays were  viewed by me. Imaging interpreted by Radiologist.  None __________________________________________  PROCEDURES  Procedure(s) performed: None  Critical Care performed: None  ____________________________________________   ED COURSE / ASSESSMENT AND PLAN  Pertinent labs & imaging results that were available during my care of the patient were reviewed by me and considered in my medical decision making (see chart for details).    The patient came in to evaluate the right foot wound where he has stitches from a recent midfoot amputation.  It sounds like there was a minor bump today, and there are 2 small areas where it there is evidence where there may have been a tiny bit of bleeding today. In any case there is no evidence of active or any signs of early infection.  Family is rightly concerned that the patient was instructed to stand and bear weight in order to transfer at his nursing home yesterday, despite that he should be nonweightbearing.  I will reiterate this with a new order going back with the patient. In addition I am unclear as to his dressing change orders, and so I'm going to write for daily dressing change with bacitracin and gauze wrap. She does have a little bit of trace edema, I'm not concerned at this point for DVT. I have asked him to condition to be nonweightbearing to elevate as much as possible to minimize the amount of pressure experienced by that wound is attempting to heal.    CONSULTATIONS:  None   Patient / Family / Caregiver informed of clinical course, medical decision-making process, and agree with plan.   I discussed return precautions, follow-up instructions, and discharged instructions with patient and/or family.   ___________________________________________   FINAL CLINICAL IMPRESSION(S) / ED DIAGNOSES   Final diagnoses:  Bleeding from wound [facility])              Note: This dictation was prepared with Dragon dictation. Any  transcriptional errors that result from this process are unintentional   Lisa Roca, MD 05/05/16 1148

## 2016-05-05 NOTE — Discharge Instructions (Signed)
You were evaluated for oozing blood from your foot wound, and the area looks overall well except for a few areas that are moist.  No evidence to infection at this point in time.    Continue to be NO WEIGHT BEARING to the right leg/foot until released by your Foot Surgeon.  Return to the ER for any new or worsening swelling, bleeding, fever, yellow or pus drainage, or any nausea, vomiting, or weakness.

## 2016-05-05 NOTE — ED Notes (Signed)
Interpretor [phone] via stratus used

## 2016-05-05 NOTE — ED Notes (Addendum)
[facility] at [facility] for [facility] and per wife and the therapist stood him; he is non weight bearing RLE for partial foot amputation 1.5 week ago. At dialysis today they were moving pt and hit right foot and it is now bleeding. Pt denies any pain. Was at dialysis prior to transfer to ED.  Pt did cardiac arrest last weekend with surgery

## 2016-05-05 NOTE — ED Notes (Signed)
Have attempted to call [facility] to give report 5 times. No answer.  Will give report to EMS when they arrive for transport back.

## 2016-06-04 ENCOUNTER — Ambulatory Visit: Payer: Medicare Other

## 2016-06-05 ENCOUNTER — Ambulatory Visit
Admission: RE | Admit: 2016-06-05 | Discharge: 2016-06-05 | Disposition: A | Discharge status: Home / Self Care | Payer: Medicare (Managed Care) | Source: Ambulatory Visit | Attending: Family Medicine | Admitting: Family Medicine

## 2016-06-05 DIAGNOSIS — I129 Hypertensive chronic kidney disease with stage 1 through stage 4 chronic kidney disease, or unspecified chronic kidney disease: Secondary | ICD-10-CM | POA: Insufficient documentation

## 2016-06-05 DIAGNOSIS — N181 Chronic kidney disease, stage 1: Secondary | ICD-10-CM | POA: Insufficient documentation

## 2016-06-05 DIAGNOSIS — Z452 Encounter for adjustment and management of vascular access device: Secondary | ICD-10-CM | POA: Diagnosis present

## 2016-06-05 LAB — GLUCOSE, CAPILLARY: GLUCOSE-CAPILLARY: 177 mg/dL — AB (ref 65–99)

## 2016-06-05 NOTE — OR Nursing (Signed)
IJ PICC line removed per Casey Burkitt RN.

## 2016-06-05 NOTE — OR Nursing (Signed)
Pt discharged to [facility] transportation. NO bleeding form site, dressing clean dry and intact. Pt given instructions per interpreter to leave dressing on x 24 hrs and them remove. Pt restated instructions.

## 2016-07-27 ENCOUNTER — Encounter: Payer: Self-pay | Admitting: *Deleted

## 2016-11-09 ENCOUNTER — Ambulatory Visit (INDEPENDENT_AMBULATORY_CARE_PROVIDER_SITE_OTHER): Payer: Medicare Other | Admitting: Vascular Surgery

## 2016-11-09 ENCOUNTER — Other Ambulatory Visit (INDEPENDENT_AMBULATORY_CARE_PROVIDER_SITE_OTHER): Payer: Medicare Other

## 2016-11-09 ENCOUNTER — Other Ambulatory Visit (INDEPENDENT_AMBULATORY_CARE_PROVIDER_SITE_OTHER): Payer: Self-pay | Admitting: Vascular Surgery

## 2016-11-09 VITALS — BP 119/71 | HR 98 | Resp 17 | Ht 70.0 in | Wt 251.0 lb

## 2016-11-09 DIAGNOSIS — E119 Type 2 diabetes mellitus without complications: Secondary | ICD-10-CM | POA: Insufficient documentation

## 2016-11-09 DIAGNOSIS — I739 Peripheral vascular disease, unspecified: Secondary | ICD-10-CM

## 2016-11-09 DIAGNOSIS — N189 Chronic kidney disease, unspecified: Secondary | ICD-10-CM | POA: Diagnosis not present

## 2016-11-09 DIAGNOSIS — Z48812 Encounter for surgical aftercare following surgery on the circulatory system: Secondary | ICD-10-CM

## 2016-11-09 DIAGNOSIS — Z992 Dependence on renal dialysis: Secondary | ICD-10-CM

## 2016-11-09 DIAGNOSIS — N186 End stage renal disease: Secondary | ICD-10-CM | POA: Diagnosis not present

## 2016-11-09 DIAGNOSIS — E118 Type 2 diabetes mellitus with unspecified complications: Secondary | ICD-10-CM

## 2016-11-09 NOTE — Assessment & Plan Note (Signed)
blood glucose control important in reducing the progression of atherosclerotic disease. Also, involved in wound healing. On appropriate medications.  

## 2016-11-09 NOTE — Progress Notes (Signed)
MRN : [id number]  [name] is a 56 y.o. (01-27-1961) male who presents with chief complaint of  Chief Complaint  Patient presents with  . Re-evaluation    Ultasound follow up  .  History of Present Illness: Patient returns today in follow up of Dialysis access. 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 He reports no fevers, chills, or chest pain. His fistula today appears patent with some reduced flow velocities in the upper arm outflow veins but no clear critical stenosis. Also, the patient has a history of peripheral arterial disease and has not had this checked in some time. Almost a year ago, he underwent extensive right lower extremity revascularization for limb salvage. He currently has no ulcerations and no signs of rest pain.  Current Outpatient Prescriptions  Medication Sig Dispense Refill  . aspirin EC 81 MG EC tablet Take 1 tablet (81 mg total) by mouth daily. 30 tablet 0  . atorvastatin (LIPITOR) 40 MG tablet Take 1 tablet (40 mg total) by mouth daily at 6 PM. 30 tablet 2  . benzonatate (TESSALON) 100 MG capsule Take 1 capsule (100 mg total) by mouth 3 (three) times daily. 20 capsule 0  . cinacalcet (SENSIPAR) 30 MG tablet Take 60 mg by mouth daily.     Marland Kitchen glipiZIDE (GLUCOTROL) 10 MG tablet Take 10 mg by mouth.    Marland Kitchen HYDROcodone-acetaminophen (NORCO/VICODIN) 5-325 MG tablet Take 1-2 tablets by mouth every 6 (six) hours as needed for moderate pain or severe pain. 20 tablet 0  . meropenem 500 mg in sodium chloride 0.9 % 50 mL Inject 500 mg into the vein daily. X 10 days 5 g 0  . metoprolol tartrate (LOPRESSOR) 25 MG tablet Take 1 tablet (25 mg total) by mouth 2 (two) times daily. 60 tablet 2  . midodrine (PROAMATINE) 10 MG tablet Take 10 mg by mouth 3 (three) times daily.    . polyethylene glycol (MIRALAX / GLYCOLAX) packet Take 17 g by mouth daily as needed for mild constipation. 14 each 0  .  senna-docusate (SENOKOT-S) 8.6-50 MG tablet Take 2 tablets by mouth 2 (two) times daily. 60 tablet 2  . sevelamer carbonate (RENVELA) 800 MG tablet Take 800 mg by mouth 3 (three) times daily with meals.     No current facility-administered medications for this visit.     Past Medical History:  Diagnosis Date  . Anemia   . Atherosclerosis   . Chronic kidney disease   . Diabetes mellitus without complication ([place])   . Hypercholesteremia   . Hypertension   . Stroke [facility])     Past Surgical History:  Procedure Laterality Date  . AMPUTATION Right 04/25/2016   Procedure: TOE AMPUTATION;  Surgeon: Katha Cabal, MD;  Location: [facility] ORS;  Service: Vascular;  Laterality: Right;  . APPLICATION OF WOUND VAC Right 04/25/2016   Procedure: APPLICATION OF WOUND VAC;  Surgeon: Katha Cabal, MD;  Location: [facility] ORS;  Service: Vascular;  Laterality: Right;  . AV FISTULA PLACEMENT Left 2015  . CARDIAC CATHETERIZATION N/A 04/30/2016   Procedure: Left Heart Cath and Coronary Angiography;  Surgeon: Wellington Hampshire, MD;  Location: [place] [facility];  Service: Cardiovascular;  Laterality: N/A;  . LEG AMPUTATION BELOW KNEE Left   . PERIPHERAL VASCULAR CATHETERIZATION N/A 10/03/2015   Procedure: A/V Shuntogram/Fistulagram;  Surgeon: [name], MD;  Location: [place] [facility];  Service: Cardiovascular;  Laterality: N/A;  . PERIPHERAL VASCULAR CATHETERIZATION N/A 01/16/2016   Procedure: Abdominal Aortogram w/Lower Extremity;  Surgeon: [name], MD;  Location: [place] [facility];  Service: Cardiovascular;  Laterality: N/A;  . PERIPHERAL VASCULAR CATHETERIZATION  01/16/2016   Procedure: Lower Extremity Intervention;  Surgeon: [name], MD;  Location: [place] [facility];  Service: Cardiovascular;;  . PERIPHERAL VASCULAR CATHETERIZATION Right 04/27/2016   Procedure: Lower Extremity Angiography;  Surgeon: Katha Cabal, MD;  Location: [place] [facility];  Service: Cardiovascular;   Laterality: Right;  . PERIPHERAL VASCULAR CATHETERIZATION  04/27/2016   Procedure: Lower Extremity Intervention;  Surgeon: Katha Cabal, MD;  Location: [place] [facility];  Service: Cardiovascular;;  . TRANSMETATARSAL AMPUTATION Right 04/28/2016   Procedure: TRANSMETATARSAL AMPUTATION;  Surgeon: Albertine Patricia, DPM;  Location: [facility];  Service: Podiatry;  Laterality: Right;  . WOUND DEBRIDEMENT Right 04/25/2016   Procedure: DEBRIDEMENT WOUND;  Surgeon: Katha Cabal, MD;  Location: [facility];  Service: Vascular;  Laterality: Right;    Social History Social History  Substance Use Topics  . Smoking status: Never Smoker  . Smokeless tobacco: Not on file  . Alcohol use No  Married, wife accompanies him today  Family History Family History  Problem Relation Age of Onset  . Prostate cancer Mother   . Diabetes Father   No bleeding or clotting disorders  No Known Allergies   REVIEW OF SYSTEMS (Negative unless checked)  Constitutional: [] Weight loss  [] Fever  [] Chills Cardiac: [] Chest pain   [] Chest pressure   [] Palpitations   [] Shortness of breath when laying flat   [] Shortness of breath at rest   [] Shortness of breath with exertion. Vascular:  [] Pain in legs with walking   [] Pain in legs at rest   [] Pain in legs when laying flat   [x] Claudication   [] Pain in feet when walking  [] Pain in feet at rest  [] Pain in feet when laying flat   [] History of DVT   [] Phlebitis   [] Swelling in legs   [] Varicose veins   [] Non-healing ulcers Pulmonary:   [] Uses home oxygen   [] Productive cough   [] Hemoptysis   [] Wheeze  [] COPD   [] Asthma Neurologic:  [] Dizziness  [] Blackouts   [] Seizures   [] History of stroke   [] History of TIA  [] Aphasia   [] Temporary blindness   [] Dysphagia   [] Weakness or numbness in arms   [] Weakness or numbness in legs Musculoskeletal:  [x] Arthritis   [] Joint swelling   [] Joint pain   [] Low back pain Hematologic:  [] Easy bruising  [] Easy bleeding   [] Hypercoagulable state    [] Anemic   Gastrointestinal:  [] Blood in stool   [] Vomiting blood  [] Gastroesophageal reflux/heartburn   [] Abdominal pain Genitourinary:  [x] Chronic kidney disease   [] Difficult urination  [] Frequent urination  [] Burning with urination   [] Hematuria Skin:  [] Rashes   [] Ulcers   [] Wounds Psychological:  [] History of anxiety   []  History of major depression.  Physical Examination  BP 119/71 (BP Location: Right Arm)   Pulse 98   Resp 17   Ht 5\' 10"  (1.778 m)   Wt 113.9 kg (251 lb)   BMI 36.01 kg/m  Gen:  WD/WN, NAD Head: [place]/AT, No temporalis wasting. Ear/Nose/Throat: Hearing grossly intact, nares w/o erythema or drainage, trachea midline Eyes: Conjunctiva clear. Sclera non-icteric Neck: Supple.  No JVD.  Pulmonary:  Good air movement, no use of accessory muscles.  Cardiac: RRR, normal S1, S2 Vascular: Left radiocephalic AV fistula  with soft thrill and good bruit Vessel Right Left  Radial Palpable Palpable  Ulnar Palpable Palpable  Brachial Palpable Palpable  Carotid Palpable, without bruit Palpable, without bruit  Aorta Not palpable N/A  Femoral Palpable Palpable  Popliteal 1+ Palpable Not Palpable  PT Not Palpable Not Palpable  DP 1+ Palpable Not Palpable   Gastrointestinal: soft, non-tender/non-distended. No guarding/reflex.  Musculoskeletal:   Left BKA. Right leg with no open wounds.  Walks with a walker Neurologic: Sensation grossly intact in extremities.  Symmetrical.  Speech is fluent.  Psychiatric: Judgment intact, Mood & affect appropriate for pt's clinical situation. Dermatologic: No rashes or ulcers noted.  No cellulitis or open wounds. Lymph : No Cervical, Axillary, or Inguinal lymphadenopathy.      Labs No results found for this or any previous visit (from the past 2160 hour(s)).  Radiology No results found.    Assessment/Plan  ESRD on dialysis [facility]) His fistula today appears patent with some reduced flow velocities in the upper arm outflow veins but  no clear critical stenosis patient continue to use this fistula without hesitation. We can recheck this as needed.  Diabetes ([place]) blood glucose control important in reducing the progression of atherosclerotic disease. Also, involved in wound healing. On appropriate medications.   PVD (peripheral vascular disease) ([place]) The patient has a history of extensive peripheral vascular disease. He has already lost his left leg. He is almost a year status post right lower extremity revascularization and I cannot see the last time this was checked. His perfusion should be checked in the next month or 2 at his convenience. He does not currently have any limb threatening symptoms.    Leotis Pain, MD  11/09/2016 9:58 AM    This note was created with Dragon medical transcription system.  Any errors from dictation are purely unintentional

## 2016-11-09 NOTE — Assessment & Plan Note (Signed)
His fistula today appears patent with some reduced flow velocities in the upper arm outflow veins but no clear critical stenosis patient continue to use this fistula without hesitation. We can recheck this as needed.

## 2016-11-09 NOTE — Assessment & Plan Note (Signed)
The patient has a history of extensive peripheral vascular disease. He has already lost his left leg. He is almost a year status post right lower extremity revascularization and I cannot see the last time this was checked. His perfusion should be checked in the next month or 2 at his convenience. He does not currently have any limb threatening symptoms.

## 2016-12-10 ENCOUNTER — Other Ambulatory Visit: Payer: Self-pay | Admitting: Cardiovascular Disease

## 2016-12-10 ENCOUNTER — Encounter (INDEPENDENT_AMBULATORY_CARE_PROVIDER_SITE_OTHER): Payer: Self-pay

## 2016-12-12 ENCOUNTER — Other Ambulatory Visit (INDEPENDENT_AMBULATORY_CARE_PROVIDER_SITE_OTHER): Payer: Self-pay

## 2016-12-12 DIAGNOSIS — Z992 Dependence on renal dialysis: Principal | ICD-10-CM

## 2016-12-12 DIAGNOSIS — N186 End stage renal disease: Secondary | ICD-10-CM

## 2016-12-23 MED ORDER — CEFAZOLIN IN D5W 1 GM/50ML IV SOLN: 1.0000 g | Freq: Once | INTRAVENOUS | Status: DC

## 2016-12-24 ENCOUNTER — Ambulatory Visit: Admission: RE | Admit: 2016-12-24 | Payer: Medicare Other | Source: Ambulatory Visit | Admitting: Vascular Surgery

## 2016-12-24 SURGERY — A/V FISTULAGRAM
Anesthesia: Moderate Sedation | Site: Arm Lower | Laterality: Left

## 2017-01-02 ENCOUNTER — Encounter (INDEPENDENT_AMBULATORY_CARE_PROVIDER_SITE_OTHER): Payer: Self-pay | Admitting: Vascular Surgery

## 2017-01-02 ENCOUNTER — Ambulatory Visit (INDEPENDENT_AMBULATORY_CARE_PROVIDER_SITE_OTHER): Payer: Medicare Other | Admitting: Vascular Surgery

## 2017-01-02 ENCOUNTER — Ambulatory Visit (INDEPENDENT_AMBULATORY_CARE_PROVIDER_SITE_OTHER): Payer: Medicare Other

## 2017-01-02 ENCOUNTER — Encounter (INDEPENDENT_AMBULATORY_CARE_PROVIDER_SITE_OTHER): Payer: Self-pay

## 2017-01-02 VITALS — BP 137/80 | HR 94 | Resp 16 | Wt 257.0 lb

## 2017-01-02 DIAGNOSIS — Z992 Dependence on renal dialysis: Secondary | ICD-10-CM | POA: Diagnosis not present

## 2017-01-02 DIAGNOSIS — I70261 Atherosclerosis of native arteries of extremities with gangrene, right leg: Secondary | ICD-10-CM

## 2017-01-02 DIAGNOSIS — E118 Type 2 diabetes mellitus with unspecified complications: Secondary | ICD-10-CM

## 2017-01-02 DIAGNOSIS — I739 Peripheral vascular disease, unspecified: Secondary | ICD-10-CM

## 2017-01-02 DIAGNOSIS — N186 End stage renal disease: Secondary | ICD-10-CM

## 2017-01-03 ENCOUNTER — Other Ambulatory Visit (INDEPENDENT_AMBULATORY_CARE_PROVIDER_SITE_OTHER): Payer: Self-pay | Admitting: Vascular Surgery

## 2017-01-06 MED ORDER — CEFAZOLIN IN D5W 1 GM/50ML IV SOLN: 1.0000 g | Freq: Once | INTRAVENOUS | Status: DC

## 2017-01-06 NOTE — Progress Notes (Signed)
Subjective:    Patient ID: [name], male    DOB: Aug 24, 1961, 56 y.o.   MRN: [id number] Chief Complaint  Patient presents with  . Follow-up   The patient presents to review vascular studies. He was scheduled to undergo a fistulogram a few weeks ago however the patient was unable to make the appointment. He states having difficulty with his access. Staff are unable to cannulate or run without the machine stopping. Last HDA was on 11/08/16. The patient also underwent a right lower extremity duplex which was notable for triphasic transitioning to monophasic distal to the popliteal artery. The patient denies any claudication, rest pain or ulceration to his lower extremity.    Review of Systems  Constitutional: Negative.   HENT: Negative.   Eyes: Negative.   Respiratory: Negative.   Cardiovascular: Negative.   Endocrine: Negative.   Genitourinary:       ESRD  Musculoskeletal: Negative.   Skin: Negative.   Allergic/Immunologic: Negative.   Neurological: Negative.   Hematological: Negative.   Psychiatric/Behavioral: Negative.       Objective:   Physical Exam  Constitutional: He is oriented to person, place, and time. He appears well-developed and well-nourished.  HENT:  Head: Normocephalic and atraumatic.  Right Ear: External ear normal.  Left Ear: External ear normal.  Eyes: Pupils are equal, round, and reactive to light.  Neck: Normal range of motion.  Cardiovascular: Normal rate, regular rhythm and normal heart sounds.   Pulses:      Radial pulses are 2+ on the right side, and 2+ on the left side.       Posterior tibial pulses are 1+ on the right side.  Left Lower Extremity: Stump intact. In prosthetic.  Left Upper Extremity: Bruit and thrill intact. Skin intact.   Pulmonary/Chest: Effort normal.  Musculoskeletal: Normal range of motion. He exhibits no edema.  Neurological: He is alert and oriented to person, place, and time.  Skin: Skin is warm and dry.  Psychiatric:  He has a normal mood and affect. His behavior is normal. Judgment and thought content normal.   BP 137/80   Pulse 94   Resp 16   Wt 116.6 kg (257 lb)   BMI 36.88 kg/m   Past Medical History:  Diagnosis Date  . Anemia   . Atherosclerosis   . Chronic kidney disease   . Diabetes mellitus without complication ([place])   . Hypercholesteremia   . Hypertension   . Stroke [facility])     Social History   Social History  . Marital status: Married    Spouse name: N/A  . Number of children: N/A  . Years of education: N/A   Occupational History  . Not on file.   Social History Main Topics  . Smoking status: Never Smoker  . Smokeless tobacco: Never Used  . Alcohol use No  . Drug use: No  . Sexual activity: Not on file   Other Topics Concern  . Not on file   Social History Narrative  . No narrative on file    Past Surgical History:  Procedure Laterality Date  . AMPUTATION Right 04/25/2016   Procedure: TOE AMPUTATION;  Surgeon: Katha Cabal, MD;  Location: [facility];  Service: Vascular;  Laterality: Right;  . APPLICATION OF WOUND VAC Right 04/25/2016   Procedure: APPLICATION OF WOUND VAC;  Surgeon: Katha Cabal, MD;  Location: [facility];  Service: Vascular;  Laterality: Right;  . AV FISTULA PLACEMENT Left 2015  . CARDIAC  CATHETERIZATION N/A 04/30/2016   Procedure: Left Heart Cath and Coronary Angiography;  Surgeon: Wellington Hampshire, MD;  Location: [place] [facility];  Service: Cardiovascular;  Laterality: N/A;  . LEG AMPUTATION BELOW KNEE Left   . PERIPHERAL VASCULAR CATHETERIZATION N/A 10/03/2015   Procedure: A/V Shuntogram/Fistulagram;  Surgeon: [name], MD;  Location: [place] [facility];  Service: Cardiovascular;  Laterality: N/A;  . PERIPHERAL VASCULAR CATHETERIZATION N/A 01/16/2016   Procedure: Abdominal Aortogram w/Lower Extremity;  Surgeon: [name], MD;  Location: [place] [facility];  Service: Cardiovascular;  Laterality: N/A;  . PERIPHERAL VASCULAR  CATHETERIZATION  01/16/2016   Procedure: Lower Extremity Intervention;  Surgeon: [name], MD;  Location: [place] [facility];  Service: Cardiovascular;;  . PERIPHERAL VASCULAR CATHETERIZATION Right 04/27/2016   Procedure: Lower Extremity Angiography;  Surgeon: Katha Cabal, MD;  Location: [place] [facility];  Service: Cardiovascular;  Laterality: Right;  . PERIPHERAL VASCULAR CATHETERIZATION  04/27/2016   Procedure: Lower Extremity Intervention;  Surgeon: Katha Cabal, MD;  Location: [place] [facility];  Service: Cardiovascular;;  . TRANSMETATARSAL AMPUTATION Right 04/28/2016   Procedure: TRANSMETATARSAL AMPUTATION;  Surgeon: Albertine Patricia, DPM;  Location: [facility];  Service: Podiatry;  Laterality: Right;  . WOUND DEBRIDEMENT Right 04/25/2016   Procedure: DEBRIDEMENT WOUND;  Surgeon: Katha Cabal, MD;  Location: [facility];  Service: Vascular;  Laterality: Right;    Family History  Problem Relation Age of Onset  . Prostate cancer Mother   . Diabetes Father     No Known Allergies     Assessment & Plan:   1. PVD (peripheral vascular disease) ([facility]) - Stable Asymptomatic. Physical exam with faint but palpable pedal pulse. Will bring patient back in six months for PAD follow up. Patient is aware he may need another angiogram.  2. ESRD on dialysis [facility]) - Worsening Patient is unable to appropriately dialysis. Recommend LUE fistulogram. Procedure, risks and benefits explained to the patient. All questions answered. Patient is willing to proceed.  3. Type 2 diabetes mellitus with complication, unspecified long term insulin use status ([place]) - Stable Encouraged good control as its slows the progression of atherosclerotic disease  Current Outpatient Prescriptions on File Prior to Visit  Medication Sig Dispense Refill  . aspirin EC 81 MG EC tablet Take 1 tablet (81 mg total) by mouth daily. 30 tablet 0  . atorvastatin (LIPITOR) 40 MG tablet Take 1 tablet (40 mg total)  by mouth daily at 6 PM. 30 tablet 2  . benzonatate (TESSALON) 100 MG capsule Take 1 capsule (100 mg total) by mouth 3 (three) times daily. 20 capsule 0  . cinacalcet (SENSIPAR) 30 MG tablet Take 60 mg by mouth daily.     Marland Kitchen glipiZIDE (GLUCOTROL) 10 MG tablet Take 10 mg by mouth.    Marland Kitchen HYDROcodone-acetaminophen (NORCO/VICODIN) 5-325 MG tablet Take 1-2 tablets by mouth every 6 (six) hours as needed for moderate pain or severe pain. 20 tablet 0  . meropenem 500 mg in sodium chloride 0.9 % 50 mL Inject 500 mg into the vein daily. X 10 days 5 g 0  . metoprolol tartrate (LOPRESSOR) 25 MG tablet Take 1 tablet (25 mg total) by mouth 2 (two) times daily. 60 tablet 2  . midodrine (PROAMATINE) 10 MG tablet Take 10 mg by mouth 3 (three) times daily.    . polyethylene glycol (MIRALAX / GLYCOLAX) packet Take 17 g by mouth daily as needed for mild constipation. 14 each 0  .  senna-docusate (SENOKOT-S) 8.6-50 MG tablet Take 2 tablets by mouth 2 (two) times daily. 60 tablet 2  . sevelamer carbonate (RENVELA) 800 MG tablet Take 800 mg by mouth 3 (three) times daily with meals.     No current facility-administered medications on file prior to visit.    There are no Patient Instructions on file for this visit. No Follow-up on file.   [place] Kuang A Roshad Hack, PA-C

## 2017-01-09 ENCOUNTER — Encounter
Admission: RE | Disposition: A | Discharge status: Home / Self Care | Payer: Self-pay | Source: Ambulatory Visit | Attending: Vascular Surgery

## 2017-01-09 ENCOUNTER — Ambulatory Visit
Admission: RE | Admit: 2017-01-09 | Discharge: 2017-01-09 | Disposition: A | Discharge status: Home / Self Care | Payer: Medicare Other | Source: Ambulatory Visit | Attending: Vascular Surgery | Admitting: Vascular Surgery

## 2017-01-09 DIAGNOSIS — I120 Hypertensive chronic kidney disease with stage 5 chronic kidney disease or end stage renal disease: Secondary | ICD-10-CM | POA: Diagnosis not present

## 2017-01-09 DIAGNOSIS — E1122 Type 2 diabetes mellitus with diabetic chronic kidney disease: Secondary | ICD-10-CM | POA: Diagnosis not present

## 2017-01-09 DIAGNOSIS — Z89512 Acquired absence of left leg below knee: Secondary | ICD-10-CM | POA: Insufficient documentation

## 2017-01-09 DIAGNOSIS — D631 Anemia in chronic kidney disease: Secondary | ICD-10-CM | POA: Diagnosis not present

## 2017-01-09 DIAGNOSIS — N186 End stage renal disease: Secondary | ICD-10-CM | POA: Insufficient documentation

## 2017-01-09 DIAGNOSIS — I2510 Atherosclerotic heart disease of native coronary artery without angina pectoris: Secondary | ICD-10-CM | POA: Insufficient documentation

## 2017-01-09 DIAGNOSIS — Y832 Surgical operation with anastomosis, bypass or graft as the cause of abnormal reaction of the patient, or of later complication, without mention of misadventure at the time of the procedure: Secondary | ICD-10-CM | POA: Diagnosis not present

## 2017-01-09 DIAGNOSIS — Z833 Family history of diabetes mellitus: Secondary | ICD-10-CM | POA: Diagnosis not present

## 2017-01-09 DIAGNOSIS — Z89421 Acquired absence of other right toe(s): Secondary | ICD-10-CM | POA: Diagnosis not present

## 2017-01-09 DIAGNOSIS — Z8673 Personal history of transient ischemic attack (TIA), and cerebral infarction without residual deficits: Secondary | ICD-10-CM | POA: Insufficient documentation

## 2017-01-09 DIAGNOSIS — Z7982 Long term (current) use of aspirin: Secondary | ICD-10-CM | POA: Insufficient documentation

## 2017-01-09 DIAGNOSIS — Z8042 Family history of malignant neoplasm of prostate: Secondary | ICD-10-CM | POA: Diagnosis not present

## 2017-01-09 DIAGNOSIS — Z7984 Long term (current) use of oral hypoglycemic drugs: Secondary | ICD-10-CM | POA: Insufficient documentation

## 2017-01-09 DIAGNOSIS — T82868A Thrombosis of vascular prosthetic devices, implants and grafts, initial encounter: Secondary | ICD-10-CM | POA: Diagnosis not present

## 2017-01-09 DIAGNOSIS — Z992 Dependence on renal dialysis: Secondary | ICD-10-CM | POA: Diagnosis not present

## 2017-01-09 DIAGNOSIS — T82590A Other mechanical complication of surgically created arteriovenous fistula, initial encounter: Secondary | ICD-10-CM | POA: Insufficient documentation

## 2017-01-09 DIAGNOSIS — E7800 Pure hypercholesterolemia, unspecified: Secondary | ICD-10-CM | POA: Insufficient documentation

## 2017-01-09 HISTORY — PX: A/V FISTULAGRAM: CATH118298

## 2017-01-09 LAB — POTASSIUM (ARMC VASCULAR LAB ONLY): Potassium (ARMC vascular lab): 5.3 — ABNORMAL HIGH (ref 3.5–5.1)

## 2017-01-09 SURGERY — A/V FISTULAGRAM
Anesthesia: Moderate Sedation | Laterality: Left

## 2017-01-09 MED ORDER — HYDRALAZINE HCL 20 MG/ML IJ SOLN: 5.0000 mg | INTRAMUSCULAR | Status: DC | PRN

## 2017-01-09 MED ORDER — PHENOL 1.4 % MT LIQD
1.0000 | OROMUCOSAL | Status: DC | PRN
  Filled 2017-01-09: qty 177

## 2017-01-09 MED ORDER — SODIUM CHLORIDE 0.9 % IV SOLN
INTRAVENOUS | Status: DC
  Administered 2017-01-09: 11:00:00 via INTRAVENOUS

## 2017-01-09 MED ORDER — HYDROMORPHONE HCL 1 MG/ML IJ SOLN: 1.0000 mg | Freq: Once | INTRAMUSCULAR | Status: DC

## 2017-01-09 MED ORDER — METOPROLOL TARTRATE 5 MG/5ML IV SOLN: 2.0000 mg | INTRAVENOUS | Status: DC | PRN

## 2017-01-09 MED ORDER — METHYLPREDNISOLONE SODIUM SUCC 125 MG IJ SOLR: 125.0000 mg | INTRAMUSCULAR | Status: DC | PRN

## 2017-01-09 MED ORDER — IOPAMIDOL (ISOVUE-300) INJECTION 61%
INTRAVENOUS | Status: DC | PRN
  Administered 2017-01-09: 20 mL via INTRA_ARTERIAL

## 2017-01-09 MED ORDER — GUAIFENESIN-DM 100-10 MG/5ML PO SYRP: 15.0000 mL | ORAL_SOLUTION | ORAL | Status: DC | PRN

## 2017-01-09 MED ORDER — OXYCODONE-ACETAMINOPHEN 5-325 MG PO TABS: 1.0000 | ORAL_TABLET | ORAL | Status: DC | PRN

## 2017-01-09 MED ORDER — FAMOTIDINE 20 MG PO TABS: 40.0000 mg | ORAL_TABLET | ORAL | Status: DC | PRN

## 2017-01-09 MED ORDER — MORPHINE SULFATE (PF) 4 MG/ML IV SOLN
2.0000 mg | INTRAVENOUS | Status: DC | PRN
Start: 2017-01-09 — End: 2017-01-09

## 2017-01-09 MED ORDER — ACETAMINOPHEN 325 MG PO TABS: 325.0000 mg | ORAL_TABLET | ORAL | Status: DC | PRN

## 2017-01-09 MED ORDER — LIDOCAINE-EPINEPHRINE (PF) 2 %-1:200000 IJ SOLN
INTRAMUSCULAR | Status: AC
  Filled 2017-01-09: qty 20

## 2017-01-09 MED ORDER — HEPARIN SODIUM (PORCINE) 1000 UNIT/ML IJ SOLN
INTRAMUSCULAR | Status: AC
  Filled 2017-01-09: qty 1

## 2017-01-09 MED ORDER — ONDANSETRON HCL 4 MG/2ML IJ SOLN: 4.0000 mg | Freq: Four times a day (QID) | INTRAMUSCULAR | Status: DC | PRN

## 2017-01-09 MED ORDER — LABETALOL HCL 5 MG/ML IV SOLN: 10.0000 mg | INTRAVENOUS | Status: DC | PRN

## 2017-01-09 MED ORDER — MIDAZOLAM HCL 5 MG/5ML IJ SOLN
INTRAMUSCULAR | Status: AC
  Filled 2017-01-09: qty 5

## 2017-01-09 MED ORDER — HEPARIN (PORCINE) IN NACL 2-0.9 UNIT/ML-% IJ SOLN
INTRAMUSCULAR | Status: AC
  Filled 2017-01-09: qty 1000

## 2017-01-09 MED ORDER — MIDAZOLAM HCL 2 MG/2ML IJ SOLN
INTRAMUSCULAR | Status: DC | PRN
  Administered 2017-01-09: 2 mg via INTRAVENOUS

## 2017-01-09 MED ORDER — DEXTROSE 5 % IV SOLN
1.5000 g | Freq: Once | INTRAVENOUS | Status: AC
  Administered 2017-01-09: 1.5 g via INTRAVENOUS

## 2017-01-09 MED ORDER — SODIUM CHLORIDE 0.9 % IV SOLN: 500.0000 mL | Freq: Once | INTRAVENOUS | Status: DC | PRN

## 2017-01-09 MED ORDER — ACETAMINOPHEN 325 MG RE SUPP
325.0000 mg | RECTAL | Status: DC | PRN
  Filled 2017-01-09: qty 2

## 2017-01-09 MED ORDER — FENTANYL CITRATE (PF) 100 MCG/2ML IJ SOLN
INTRAMUSCULAR | Status: DC | PRN
  Administered 2017-01-09: 50 ug via INTRAVENOUS

## 2017-01-09 MED ORDER — FENTANYL CITRATE (PF) 100 MCG/2ML IJ SOLN
INTRAMUSCULAR | Status: AC
  Filled 2017-01-09: qty 2

## 2017-01-09 MED ORDER — HEPARIN (PORCINE) IN NACL 2-0.9 UNIT/ML-% IJ SOLN
INTRAMUSCULAR | Status: AC
Start: 2017-01-09 — End: 2017-01-09
  Filled 2017-01-09: qty 1000

## 2017-01-09 MED ORDER — ALUM & MAG HYDROXIDE-SIMETH 200-200-20 MG/5ML PO SUSP: 15.0000 mL | ORAL | Status: DC | PRN

## 2017-01-09 SURGICAL SUPPLY — 6 items
CANNULA 5F STIFF (CANNULA) ×3 IMPLANT
DRAPE BRACHIAL (DRAPES) ×3 IMPLANT
PACK ANGIOGRAPHY (CUSTOM PROCEDURE TRAY) ×3 IMPLANT
SHEATH BRITE TIP 6FRX5.5 (SHEATH) ×3 IMPLANT
SUT MNCRL AB 4-0 PS2 18 (SUTURE) ×3 IMPLANT
TOWEL OR 17X26 4PK STRL BLUE (TOWEL DISPOSABLE) ×3 IMPLANT

## 2017-01-09 NOTE — H&P (Signed)
[facility] History & Physical Update  The patient was interviewed and re-examined.  The patient's previous History and Physical has been reviewed and is unchanged.  There is no change in the plan of care. We plan to proceed with the scheduled procedure.  Leotis Pain, MD  01/09/2017, 11:07 AM

## 2017-01-09 NOTE — Op Note (Signed)
[facility]    OPERATIVE NOTE   PROCEDURE: 1.   Left radiocephalic arteriovenous fistula cannulation under ultrasound guidance 2.   Left arm fistulagram including central venogram  PRE-OPERATIVE DIAGNOSIS: 1. ESRD 2. Poorly functional left arm AVF  POST-OPERATIVE DIAGNOSIS: same as above   SURGEON: Leotis Pain, MD  ANESTHESIA: local with MCS  ESTIMATED BLOOD LOSS: minimal  FINDING(S): 1. Widely patent left radiocephalic AV fistula with no areas of significant stenosis. There was dual outflow in the upper arm. The central venous circulation appeared to be patent as well although imaging was somewhat faint by the time the contrast reached the central veins.  SPECIMEN(S):  None  CONTRAST: 20 cc  FLUORO TIME: 0.3 minutes  MODERATE CONSCIOUS SEDATION TIME: Approximately 15 minutes with 2 mg of Versed and 50 mcg of Fentanyl   INDICATIONS: [name] is a 56 y.o. male who presents with malfunctioning left radiocephalic arteriovenous fistula with difficulties with access and low flow per the dialysis center.  The patient is scheduled for left arm fistulagram at the request of his outpatient dialysis center.  The patient is aware the risks include but are not limited to: bleeding, infection, thrombosis of the cannulated access, and possible anaphylactic reaction to the contrast.  The patient is aware of the risks of the procedure and elects to proceed forward.  DESCRIPTION: After full informed written consent was obtained, the patient was brought back to the angiography suite and placed supine upon the angiography table.  The patient was connected to monitoring equipment. Moderate conscious sedation was administered with a face to face encounter with the patient throughout the procedure with my supervision of the RN administering medicines and monitoring the patient's vital signs and mental status throughout from the start of the procedure until the patient was  taken to the recovery room. The left arm was prepped and draped in the standard fashion for a percutaneous access intervention.  Under ultrasound guidance, the left radiocephalic arteriovenous fistula was cannulated with a micropuncture needle under direct ultrasound guidance and a permanent image was performed.  The microwire was advanced into the fistula and the needle was exchanged for the a microsheath. Hand injections were completed to image the access including the central venous system. This demonstrated a widely patent left radiocephalic AV fistula with no areas of significant stenosis. There was dual outflow in the upper arm. The central venous circulation appeared to be patent as well although imaging was somewhat faint by the time the contrast reached the central veins.  Based on the images, this patient will need no intervention. A 4-0 Monocryl purse-string suture was sewn around the sheath.  The sheath was removed while tying down the suture.  A sterile bandage was applied to the puncture site.  COMPLICATIONS: None  CONDITION: Stable   Leotis Pain  01/09/2017 11:25 AM   This note was created with Dragon Medical transcription system. Any errors in dictation are purely unintentional.

## 2017-01-11 ENCOUNTER — Encounter: Payer: Self-pay | Admitting: Vascular Surgery

## 2017-03-18 ENCOUNTER — Emergency Department: Payer: Medicare Other

## 2017-03-18 ENCOUNTER — Emergency Department
Admission: EM | Admit: 2017-03-18 | Discharge: 2017-03-18 | Disposition: A | Discharge status: Home / Self Care | Payer: Medicare Other | Attending: Emergency Medicine | Admitting: Emergency Medicine

## 2017-03-18 ENCOUNTER — Encounter: Payer: Self-pay | Admitting: Emergency Medicine

## 2017-03-18 DIAGNOSIS — Z79899 Other long term (current) drug therapy: Secondary | ICD-10-CM | POA: Insufficient documentation

## 2017-03-18 DIAGNOSIS — Y9289 Other specified places as the place of occurrence of the external cause: Secondary | ICD-10-CM | POA: Insufficient documentation

## 2017-03-18 DIAGNOSIS — W050XXA Fall from non-moving wheelchair, initial encounter: Secondary | ICD-10-CM | POA: Diagnosis not present

## 2017-03-18 DIAGNOSIS — Y9389 Activity, other specified: Secondary | ICD-10-CM | POA: Diagnosis not present

## 2017-03-18 DIAGNOSIS — Y999 Unspecified external cause status: Secondary | ICD-10-CM | POA: Diagnosis not present

## 2017-03-18 DIAGNOSIS — N186 End stage renal disease: Secondary | ICD-10-CM | POA: Insufficient documentation

## 2017-03-18 DIAGNOSIS — M542 Cervicalgia: Secondary | ICD-10-CM

## 2017-03-18 DIAGNOSIS — M545 Low back pain, unspecified: Secondary | ICD-10-CM

## 2017-03-18 DIAGNOSIS — I120 Hypertensive chronic kidney disease with stage 5 chronic kidney disease or end stage renal disease: Secondary | ICD-10-CM | POA: Diagnosis not present

## 2017-03-18 DIAGNOSIS — Z992 Dependence on renal dialysis: Secondary | ICD-10-CM | POA: Insufficient documentation

## 2017-03-18 DIAGNOSIS — Z8673 Personal history of transient ischemic attack (TIA), and cerebral infarction without residual deficits: Secondary | ICD-10-CM | POA: Insufficient documentation

## 2017-03-18 DIAGNOSIS — M25522 Pain in left elbow: Secondary | ICD-10-CM | POA: Diagnosis not present

## 2017-03-18 DIAGNOSIS — Z7982 Long term (current) use of aspirin: Secondary | ICD-10-CM | POA: Diagnosis not present

## 2017-03-18 DIAGNOSIS — S3992XA Unspecified injury of lower back, initial encounter: Secondary | ICD-10-CM | POA: Diagnosis present

## 2017-03-18 DIAGNOSIS — E1122 Type 2 diabetes mellitus with diabetic chronic kidney disease: Secondary | ICD-10-CM | POA: Diagnosis not present

## 2017-03-18 DIAGNOSIS — Z7984 Long term (current) use of oral hypoglycemic drugs: Secondary | ICD-10-CM | POA: Diagnosis not present

## 2017-03-18 MED ORDER — TRAMADOL HCL 50 MG PO TABS
100.0000 mg | ORAL_TABLET | Freq: Once | ORAL | Status: AC
  Administered 2017-03-18: 100 mg via ORAL
  Filled 2017-03-18: qty 2

## 2017-03-18 MED ORDER — TRAMADOL HCL 50 MG PO TABS: 100.0000 mg | ORAL_TABLET | Freq: Four times a day (QID) | ORAL | 0 refills | Status: DC | PRN

## 2017-03-18 MED ORDER — CYCLOBENZAPRINE HCL 5 MG PO TABS: 5.0000 mg | ORAL_TABLET | Freq: Three times a day (TID) | ORAL | 0 refills | Status: DC | PRN

## 2017-03-18 MED ORDER — BACITRACIN ZINC 500 UNIT/GM EX OINT
TOPICAL_OINTMENT | Freq: Two times a day (BID) | CUTANEOUS | Status: DC
  Filled 2017-03-18: qty 0.9

## 2017-03-18 NOTE — Discharge Instructions (Signed)
Follow up with Orthopedics, Dr. Mack Guise.

## 2017-03-18 NOTE — ED Provider Notes (Signed)
[facility] Emergency Department Provider Note   ____________________________________________   I have reviewed the triage vital signs and the nursing notes.   HISTORY  Chief Complaint Arm Pain    HPI [name] is a 56 y.o. male presents with neck and back pain, and left arm pain after falling off of a ramp earlier this morning. Patient was being wheeled in a wheelchair down the ramp when the railing broke and he landed on his back and left elbow. Patient is a renal dialysis patient and a left above-the-knee amputee. Patient denies loss of consciousness and recalls the fall. Patient denies any past back injuries. Patient denies radicular pain in the upper or lower extremities or saddle anesthesia. Patient denies fever, chills, headache, vision changes, chest pain, chest tightness, shortness of breath, abdominal pain, nausea and vomiting.  Past Medical History:  Diagnosis Date  . Anemia   . Atherosclerosis   . Chronic kidney disease   . Diabetes mellitus without complication ([place])   . Hypercholesteremia   . Hypertension   . Stroke [facility])     Patient Active Problem List   Diagnosis Date Noted  . ESRD on dialysis ([place]) 11/09/2016  . Diabetes ([place]) 11/09/2016  . PVD (peripheral vascular disease) ([place]) 11/09/2016  . Cardiac arrest ([place]) 04/28/2016  . Atherosclerosis of extremity with gangrene ([place]) 04/25/2016    Past Surgical History:  Procedure Laterality Date  . A/V SHUNTOGRAM Left 01/09/2017   Procedure: A/V Fistulagram;  Surgeon: [name], MD;  Location: [place] CV LAB;  Service: Cardiovascular;  Laterality: Left;  . AMPUTATION Right 04/25/2016   Procedure: TOE AMPUTATION;  Surgeon: Katha Cabal, MD;  Location: [facility] ORS;  Service: Vascular;  Laterality: Right;  . APPLICATION OF WOUND VAC Right 04/25/2016   Procedure: APPLICATION OF WOUND VAC;  Surgeon: Katha Cabal, MD;  Location: [facility] ORS;  Service: Vascular;  Laterality:  Right;  . AV FISTULA PLACEMENT Left 2015  . CARDIAC CATHETERIZATION N/A 04/30/2016   Procedure: Left Heart Cath and Coronary Angiography;  Surgeon: Wellington Hampshire, MD;  Location: [place] CV LAB;  Service: Cardiovascular;  Laterality: N/A;  . LEG AMPUTATION BELOW KNEE Left   . PERIPHERAL VASCULAR CATHETERIZATION N/A 10/03/2015   Procedure: A/V Shuntogram/Fistulagram;  Surgeon: [name], MD;  Location: [place] CV LAB;  Service: Cardiovascular;  Laterality: N/A;  . PERIPHERAL VASCULAR CATHETERIZATION N/A 01/16/2016   Procedure: Abdominal Aortogram w/Lower Extremity;  Surgeon: [name], MD;  Location: [place] CV LAB;  Service: Cardiovascular;  Laterality: N/A;  . PERIPHERAL VASCULAR CATHETERIZATION  01/16/2016   Procedure: Lower Extremity Intervention;  Surgeon: [name], MD;  Location: [place] CV LAB;  Service: Cardiovascular;;  . PERIPHERAL VASCULAR CATHETERIZATION Right 04/27/2016   Procedure: Lower Extremity Angiography;  Surgeon: Katha Cabal, MD;  Location: [place] CV LAB;  Service: Cardiovascular;  Laterality: Right;  . PERIPHERAL VASCULAR CATHETERIZATION  04/27/2016   Procedure: Lower Extremity Intervention;  Surgeon: Katha Cabal, MD;  Location: [place] CV LAB;  Service: Cardiovascular;;  . TRANSMETATARSAL AMPUTATION Right 04/28/2016   Procedure: TRANSMETATARSAL AMPUTATION;  Surgeon: Albertine Patricia, DPM;  Location: [facility] ORS;  Service: Podiatry;  Laterality: Right;  . WOUND DEBRIDEMENT Right 04/25/2016   Procedure: DEBRIDEMENT WOUND;  Surgeon: Katha Cabal, MD;  Location: [facility] ORS;  Service: Vascular;  Laterality: Right;    Prior to Admission medications   Medication Sig Start Date End Date Taking? Authorizing Provider  aspirin EC 81 MG EC tablet Take 1 tablet (81 mg total) by mouth daily. 05/02/16   Gladstone Lighter, MD  atorvastatin (LIPITOR) 40 MG tablet Take 1 tablet (40 mg total) by mouth daily at 6 PM. Patient not taking:  Reported on 01/09/2017 05/02/16   Gladstone Lighter, MD  benzonatate (TESSALON) 100 MG capsule Take 1 capsule (100 mg total) by mouth 3 (three) times daily. Patient not taking: Reported on 01/09/2017 05/02/16   Gladstone Lighter, MD  cinacalcet (SENSIPAR) 30 MG tablet Take 60 mg by mouth daily.     [provider]  cyclobenzaprine (FLEXERIL) 5 MG tablet Take 1 tablet (5 mg total) by mouth 3 (three) times daily as needed for muscle spasms. 03/18/17   Ogden Handlin M, PA-C  glipiZIDE (GLUCOTROL) 10 MG tablet Take 10 mg by mouth. 10/25/16   [provider]  HYDROcodone-acetaminophen (NORCO/VICODIN) 5-325 MG tablet Take 1-2 tablets by mouth every 6 (six) hours as needed for moderate pain or severe pain. Patient not taking: Reported on 01/09/2017 05/02/16   Gladstone Lighter, MD  meropenem 500 mg in sodium chloride 0.9 % 50 mL Inject 500 mg into the vein daily. X 10 days Patient not taking: Reported on 01/09/2017 05/02/16   Gladstone Lighter, MD  metoprolol tartrate (LOPRESSOR) 25 MG tablet Take 1 tablet (25 mg total) by mouth 2 (two) times daily. Patient not taking: Reported on 01/09/2017 05/02/16   Gladstone Lighter, MD  midodrine (PROAMATINE) 10 MG tablet Take 10 mg by mouth 3 (three) times daily.    [provider]  polyethylene glycol (MIRALAX / GLYCOLAX) packet Take 17 g by mouth daily as needed for mild constipation. Patient not taking: Reported on 01/09/2017 05/02/16   Gladstone Lighter, MD  senna-docusate (SENOKOT-S) 8.6-50 MG tablet Take 2 tablets by mouth 2 (two) times daily. Patient not taking: Reported on 01/09/2017 05/02/16   Gladstone Lighter, MD  sevelamer carbonate (RENVELA) 800 MG tablet Take 800 mg by mouth 3 (three) times daily with meals.    [provider]  traMADol (ULTRAM) 50 MG tablet Take 2 tablets (100 mg total) by mouth every 6 (six) hours as needed. 03/18/17 03/18/18  Delrae Hagey, Laroy Apple, PA-C    Allergies Patient has no known allergies.  Family  History  Problem Relation Age of Onset  . Prostate cancer Mother   . Diabetes Father     Social History Social History  Substance Use Topics  . Smoking status: Never Smoker  . Smokeless tobacco: Never Used  . Alcohol use No    Review of Systems Constitutional: Negative for fever/chills Eyes: No visual changes. Cardiovascular: Denies chest pain. Respiratory: Denies cough Denies shortness of breath. Gastrointestinal: No abdominal pain. Musculoskeletal: Positive back and neck pain. Left elbow pain, shoulder pain. Skin: Negative for rash. Neurological: Negative for headaches.  Negative focal weakness or numbness. Negative for loss of consciousness. Able to ambulate. ____________________________________________   PHYSICAL EXAM:  VITAL SIGNS: ED Triage Vitals  Enc Vitals Group     BP --      Pulse Rate 03/18/17 0733 93     Resp 03/18/17 0733 18     Temp 03/18/17 0733 98.4 F (36.9 C)     Temp Source 03/18/17 0733 Oral     SpO2 03/18/17 0733 95 %     Weight 03/18/17 0732 257 lb (116.6 kg)     Height --      Head Circumference --      Peak Flow --  Pain Score 03/18/17 0732 6     Pain Loc --      Pain Edu? --      Excl. in [place]? --     Constitutional: Alert and oriented. Well appearing and in no acute distress.  Head: Normocephalic and atraumatic. Eyes: Conjunctivae are normal. PERRL. Normal extraocular movements.  Cardiovascular: Normal rate, regular rhythm. Normal distal pulses. Respiratory: Normal respiratory effort.  Gastrointestinal: Soft and nontender. No distention. Musculoskeletal: Nontender with normal range of motion in all extremities. Cervical spine pain with range of motion intact, lumbar spine pain with range of motion intact. Left shoulder range of motion within functional limits with mild pain. No deformities noted. Left elbow: Unable to fully flex or supinate. Sensation intact in the left upper extremity. Neurologic: Normal speech and language. No  gross focal neurologic deficits are appreciated. No sensory loss or abnormal reflexes.  Skin:  Skin is warm, dry and intact. No rash noted. Psychiatric: Mood and affect are normal.  ____________________________________________   LABS (all labs ordered are listed, but only abnormal results are displayed)  Labs Reviewed - No data to display ____________________________________________  EKG None ____________________________________________  RADIOLOGY DG cervical IMPRESSION: Bulky anterior spurs at C5-C6.  No acute cervical spine abnormalities.  DG lumbar IMPRESSION: Changes of renal osteodystrophy with age-indeterminate superior endplate concavity at L2 vertebral body and mild retrolisthesis at L5-S1.  DG left elbow IMPRESSION: No acute osseous abnormalities.  Extensive atherosclerotic calcifications likely related to history of end-stage renal disease. ____________________________________________   PROCEDURES  Procedure(s) performed: No    Critical Care performed: no ____________________________________________   INITIAL IMPRESSION / ASSESSMENT AND PLAN / ED COURSE  Pertinent labs & imaging results that were available during my care of the patient were reviewed by me and considered in my medical decision making (see chart for details).  Patient presents with cervical neck pain, lumbar back pain, and left elbow pain secondary to a fall earlier this morning. Physical exam findings and imaging are reassuring for no acute fracture or neurovascular injury. Symptoms are consistent with muscular skeletal sprain and strain injury pattern. Patient noted improvement of symptoms with tramadol during the course of care in the emergency department today. Patient will be given prescriptions for tramadol for pain management and Flexeril as needed until he follows up with orthopedics. Patient  informed of clinical course, understand medical decision-making process, and agree with  plan.  Patient was advised to follow up with orthopedics and was also advised to return to the emergency department for symptoms that change or worsen.      ____________________________________________   FINAL CLINICAL IMPRESSION(S) / ED DIAGNOSES  Final diagnoses:  Acute bilateral low back pain without sciatica  Acute neck pain  Left elbow pain       NEW MEDICATIONS STARTED DURING THIS VISIT:  Discharge Medication List as of 03/18/2017  9:36 AM    START taking these medications   Details  cyclobenzaprine (FLEXERIL) 5 MG tablet Take 1 tablet (5 mg total) by mouth 3 (three) times daily as needed for muscle spasms., Starting Mon 03/18/2017, Print    traMADol (ULTRAM) 50 MG tablet Take 2 tablets (100 mg total) by mouth every 6 (six) hours as needed., Starting Mon 03/18/2017, Until Tue 03/18/2018, Print         Note:  This document was prepared using Dragon voice recognition software and may include unintentional dictation errors.    Alric Quan 03/18/17 1129    Harvest Dark, MD 03/18/17 1431

## 2017-03-18 NOTE — ED Notes (Signed)
See triage note. States he was going to dialysis this am  And the ramp broke  He fell from w/c  Having pain to left arm,bck and neck

## 2017-03-18 NOTE — ED Triage Notes (Signed)
Pt was in a wheelchair being pushed on a ramp and the ramp broke causing the pt to fall out of the wheelchair and c/o left elbow pain, NO LOC at time of fall. NAD.

## 2017-05-31 ENCOUNTER — Other Ambulatory Visit (INDEPENDENT_AMBULATORY_CARE_PROVIDER_SITE_OTHER): Payer: Self-pay

## 2017-06-03 ENCOUNTER — Encounter (INDEPENDENT_AMBULATORY_CARE_PROVIDER_SITE_OTHER): Payer: Self-pay

## 2017-06-04 ENCOUNTER — Encounter
Admission: RE | Admit: 2017-06-04 | Discharge: 2017-06-04 | Disposition: A | Discharge status: Home / Self Care | Payer: Medicare Other | Source: Ambulatory Visit | Attending: Vascular Surgery | Admitting: Vascular Surgery

## 2017-06-04 DIAGNOSIS — I120 Hypertensive chronic kidney disease with stage 5 chronic kidney disease or end stage renal disease: Secondary | ICD-10-CM | POA: Diagnosis not present

## 2017-06-04 DIAGNOSIS — Z8673 Personal history of transient ischemic attack (TIA), and cerebral infarction without residual deficits: Secondary | ICD-10-CM | POA: Diagnosis not present

## 2017-06-04 DIAGNOSIS — Z9842 Cataract extraction status, left eye: Secondary | ICD-10-CM | POA: Diagnosis not present

## 2017-06-04 DIAGNOSIS — E1122 Type 2 diabetes mellitus with diabetic chronic kidney disease: Secondary | ICD-10-CM | POA: Diagnosis not present

## 2017-06-04 DIAGNOSIS — Z9889 Other specified postprocedural states: Secondary | ICD-10-CM | POA: Diagnosis not present

## 2017-06-04 DIAGNOSIS — N186 End stage renal disease: Secondary | ICD-10-CM | POA: Diagnosis not present

## 2017-06-04 DIAGNOSIS — Z833 Family history of diabetes mellitus: Secondary | ICD-10-CM | POA: Diagnosis not present

## 2017-06-04 DIAGNOSIS — Z89512 Acquired absence of left leg below knee: Secondary | ICD-10-CM | POA: Diagnosis not present

## 2017-06-04 DIAGNOSIS — Z992 Dependence on renal dialysis: Secondary | ICD-10-CM | POA: Diagnosis not present

## 2017-06-04 DIAGNOSIS — Z89421 Acquired absence of other right toe(s): Secondary | ICD-10-CM | POA: Diagnosis not present

## 2017-06-04 DIAGNOSIS — T82858A Stenosis of vascular prosthetic devices, implants and grafts, initial encounter: Secondary | ICD-10-CM | POA: Diagnosis present

## 2017-06-04 DIAGNOSIS — Z8042 Family history of malignant neoplasm of prostate: Secondary | ICD-10-CM | POA: Diagnosis not present

## 2017-06-04 DIAGNOSIS — E7800 Pure hypercholesterolemia, unspecified: Secondary | ICD-10-CM | POA: Diagnosis not present

## 2017-06-04 DIAGNOSIS — Z9841 Cataract extraction status, right eye: Secondary | ICD-10-CM | POA: Diagnosis not present

## 2017-06-04 DIAGNOSIS — Y832 Surgical operation with anastomosis, bypass or graft as the cause of abnormal reaction of the patient, or of later complication, without mention of misadventure at the time of the procedure: Secondary | ICD-10-CM | POA: Diagnosis not present

## 2017-06-04 DIAGNOSIS — I2510 Atherosclerotic heart disease of native coronary artery without angina pectoris: Secondary | ICD-10-CM | POA: Diagnosis not present

## 2017-06-04 NOTE — Patient Instructions (Signed)
[facility]  Your procedure is scheduled with Dr. Lucky Cowboy                            On:  Date:     06/06/17               Time: as instructed by Dr. Bunnie Domino office     On arrival go [facility] on first floor of the [facility]. Your arrival time will be approximately 1-1.5 hours prior to you procedure start time. This allows time to check lab results and prep you for the procedure.  If your procedure time change you will be notified by the doctor's office.    _x____Do not eat or drink 8 hours prior to your procedure.    __x___Take all your morning medications with sips of water.   _x____Continue Aspirin.  __x___Bring your current medications in their bottle with you.   Your recovery time will be 1-2 hours (1 hour for a  vein stick, 2 hours for an artery stick )  Please call Dr Bunnie Domino office with any questions or concerns: [phone].  You will need to have someone drive you home and stay with you the night of the procedure.

## 2017-06-05 MED ORDER — CEFAZOLIN SODIUM-DEXTROSE 2-4 GM/100ML-% IV SOLN: 2.0000 g | Freq: Once | INTRAVENOUS | Status: DC

## 2017-06-06 ENCOUNTER — Encounter
Admission: RE | Disposition: A | Discharge status: Home / Self Care | Payer: Self-pay | Source: Ambulatory Visit | Attending: Vascular Surgery

## 2017-06-06 ENCOUNTER — Ambulatory Visit
Admission: RE | Admit: 2017-06-06 | Discharge: 2017-06-06 | Disposition: A | Discharge status: Home / Self Care | Payer: Medicare Other | Source: Ambulatory Visit | Attending: Vascular Surgery | Admitting: Vascular Surgery

## 2017-06-06 DIAGNOSIS — Z992 Dependence on renal dialysis: Secondary | ICD-10-CM | POA: Insufficient documentation

## 2017-06-06 DIAGNOSIS — T82858A Stenosis of vascular prosthetic devices, implants and grafts, initial encounter: Secondary | ICD-10-CM | POA: Insufficient documentation

## 2017-06-06 DIAGNOSIS — Z833 Family history of diabetes mellitus: Secondary | ICD-10-CM | POA: Insufficient documentation

## 2017-06-06 DIAGNOSIS — I10 Essential (primary) hypertension: Secondary | ICD-10-CM | POA: Diagnosis not present

## 2017-06-06 DIAGNOSIS — Z8673 Personal history of transient ischemic attack (TIA), and cerebral infarction without residual deficits: Secondary | ICD-10-CM | POA: Insufficient documentation

## 2017-06-06 DIAGNOSIS — Z9889 Other specified postprocedural states: Secondary | ICD-10-CM | POA: Insufficient documentation

## 2017-06-06 DIAGNOSIS — Z9841 Cataract extraction status, right eye: Secondary | ICD-10-CM | POA: Insufficient documentation

## 2017-06-06 DIAGNOSIS — Z9842 Cataract extraction status, left eye: Secondary | ICD-10-CM | POA: Insufficient documentation

## 2017-06-06 DIAGNOSIS — E7800 Pure hypercholesterolemia, unspecified: Secondary | ICD-10-CM | POA: Insufficient documentation

## 2017-06-06 DIAGNOSIS — Z8042 Family history of malignant neoplasm of prostate: Secondary | ICD-10-CM | POA: Insufficient documentation

## 2017-06-06 DIAGNOSIS — I120 Hypertensive chronic kidney disease with stage 5 chronic kidney disease or end stage renal disease: Secondary | ICD-10-CM | POA: Insufficient documentation

## 2017-06-06 DIAGNOSIS — Z89421 Acquired absence of other right toe(s): Secondary | ICD-10-CM | POA: Insufficient documentation

## 2017-06-06 DIAGNOSIS — T82868A Thrombosis of vascular prosthetic devices, implants and grafts, initial encounter: Secondary | ICD-10-CM

## 2017-06-06 DIAGNOSIS — E1122 Type 2 diabetes mellitus with diabetic chronic kidney disease: Secondary | ICD-10-CM | POA: Insufficient documentation

## 2017-06-06 DIAGNOSIS — N186 End stage renal disease: Secondary | ICD-10-CM | POA: Insufficient documentation

## 2017-06-06 DIAGNOSIS — I2510 Atherosclerotic heart disease of native coronary artery without angina pectoris: Secondary | ICD-10-CM | POA: Insufficient documentation

## 2017-06-06 DIAGNOSIS — E119 Type 2 diabetes mellitus without complications: Secondary | ICD-10-CM | POA: Diagnosis not present

## 2017-06-06 DIAGNOSIS — Y832 Surgical operation with anastomosis, bypass or graft as the cause of abnormal reaction of the patient, or of later complication, without mention of misadventure at the time of the procedure: Secondary | ICD-10-CM | POA: Insufficient documentation

## 2017-06-06 DIAGNOSIS — Z89512 Acquired absence of left leg below knee: Secondary | ICD-10-CM | POA: Insufficient documentation

## 2017-06-06 HISTORY — PX: A/V FISTULAGRAM: CATH118298

## 2017-06-06 HISTORY — PX: A/V SHUNT INTERVENTION: CATH118220

## 2017-06-06 LAB — POTASSIUM (ARMC VASCULAR LAB ONLY): Potassium (ARMC vascular lab): 4.8 (ref 3.5–5.1)

## 2017-06-06 LAB — GLUCOSE, CAPILLARY: Glucose-Capillary: 134 mg/dL — ABNORMAL HIGH (ref 65–99)

## 2017-06-06 SURGERY — A/V FISTULAGRAM
Anesthesia: Moderate Sedation

## 2017-06-06 MED ORDER — SODIUM CHLORIDE 0.9 % IV SOLN: 250.0000 mL | INTRAVENOUS | Status: DC | PRN

## 2017-06-06 MED ORDER — ONDANSETRON HCL 4 MG/2ML IJ SOLN: 4.0000 mg | Freq: Once | INTRAMUSCULAR | Status: DC

## 2017-06-06 MED ORDER — SODIUM CHLORIDE 0.9 % IV SOLN: 1.0000 mg/h | INTRAVENOUS | Status: DC

## 2017-06-06 MED ORDER — IOPAMIDOL (ISOVUE-300) INJECTION 61%
INTRAVENOUS | Status: DC | PRN
  Administered 2017-06-06: 20 mL via INTRAVENOUS

## 2017-06-06 MED ORDER — SODIUM CHLORIDE 0.9% FLUSH: 3.0000 mL | INTRAVENOUS | Status: DC | PRN

## 2017-06-06 MED ORDER — MIDAZOLAM HCL 5 MG/5ML IJ SOLN
INTRAMUSCULAR | Status: AC
  Filled 2017-06-06: qty 5

## 2017-06-06 MED ORDER — FENTANYL CITRATE (PF) 100 MCG/2ML IJ SOLN
INTRAMUSCULAR | Status: DC | PRN
  Administered 2017-06-06: 50 ug via INTRAVENOUS

## 2017-06-06 MED ORDER — HEPARIN SODIUM (PORCINE) 1000 UNIT/ML IJ SOLN
INTRAMUSCULAR | Status: AC
  Filled 2017-06-06: qty 1

## 2017-06-06 MED ORDER — CEFAZOLIN SODIUM-DEXTROSE 2-4 GM/100ML-% IV SOLN
INTRAVENOUS | Status: AC
  Filled 2017-06-06: qty 100

## 2017-06-06 MED ORDER — FENTANYL CITRATE (PF) 100 MCG/2ML IJ SOLN
INTRAMUSCULAR | Status: AC
  Filled 2017-06-06: qty 2

## 2017-06-06 MED ORDER — MIDAZOLAM HCL 2 MG/2ML IJ SOLN
INTRAMUSCULAR | Status: DC | PRN
  Administered 2017-06-06: 2 mg via INTRAVENOUS

## 2017-06-06 MED ORDER — SODIUM CHLORIDE 0.9 % IV BOLUS (SEPSIS)
INTRAVENOUS | Status: AC | PRN
  Administered 2017-06-06: 250 mL via INTRAVENOUS

## 2017-06-06 MED ORDER — SODIUM CHLORIDE 0.9% FLUSH: 3.0000 mL | Freq: Two times a day (BID) | INTRAVENOUS | Status: DC

## 2017-06-06 SURGICAL SUPPLY — 14 items
BALLN LUTONIX DCB 5X60X130 (BALLOONS) ×4
BALLOON LUTONIX DCB 5X60X130 (BALLOONS) ×2 IMPLANT
CATH BEACON 5 .035 40 KMP TP (CATHETERS) ×2 IMPLANT
CATH BEACON 5 .038 40 KMP TP (CATHETERS) ×2
COVER PROBE U/S 5X48 (MISCELLANEOUS) ×4 IMPLANT
DEVICE PRESTO INFLATION (MISCELLANEOUS) ×4 IMPLANT
DRAPE BRACHIAL (DRAPES) ×4 IMPLANT
NEEDLE ENTRY 21GA 7CM ECHOTIP (NEEDLE) ×4 IMPLANT
PACK ANGIOGRAPHY (CUSTOM PROCEDURE TRAY) ×4 IMPLANT
SET INTRO CAPELLA COAXIAL (SET/KITS/TRAYS/PACK) ×4 IMPLANT
SHEATH BRITE TIP 6FRX5.5 (SHEATH) ×4 IMPLANT
SHIELD RADPAD DADD DRAPE 4X9 (MISCELLANEOUS) ×4 IMPLANT
SUT MNCRL AB 4-0 PS2 18 (SUTURE) ×4 IMPLANT
WIRE MAGIC TOR.035 180C (WIRE) ×4 IMPLANT

## 2017-06-06 NOTE — OR Nursing (Signed)
0940-Unable to contact the office for a 6 week appointment with HDA. Pt chart shows he has an appointment for 07-05-17 at 8:30 am. He will need to have that changed to a Tuesday or Thursday. A card with Dr. Bunnie Domino office number was given to pt and the interpreter explained they would have to call and have the appointment changed.

## 2017-06-06 NOTE — Discharge Instructions (Signed)

## 2017-06-06 NOTE — H&P (Signed)
[facility] Admission History & Physical  MRN : [id number]  [name] is a 56 y.o. (1960-11-25) male who presents with chief complaint of No chief complaint on file. Marland Kitchen  History of Present Illness: I am asked to evaluate the patient by the dialysis center. The patient was sent here because they were unable to achieve adequate dialysis this morning. Furthermore the Center states there is very poor thrill and bruit. The patient states there there have been increasing problems with the access, such as "pulling clots" during dialysis and prolonged bleeding after decannulation. The patient estimates these problems have been going on for several weeks. The patient is unaware of any other change.  Patient denies pain or tenderness overlying the access.  There is no pain with dialysis.  The patient denies hand pain or finger pain consistent with steal syndrome.   There have been several past interventions or declots of this access.  The patient is not chronically hypotensive on dialysis.  Current Facility-Administered Medications  Medication Dose Route Frequency Provider Last Rate Last Dose  . ceFAZolin (ANCEF) 2-4 GM/100ML-% IVPB           . 0.9 %  sodium chloride infusion  250 mL Intravenous PRN [name], MD      . ceFAZolin (ANCEF) IVPB 2g/100 mL premix  2 g Intravenous Once [name], MD      . sodium chloride flush (NS) 0.9 % injection 3 mL  3 mL Intravenous Q12H Keylen Eckenrode, Erskine Squibb, MD      . sodium chloride flush (NS) 0.9 % injection 3 mL  3 mL Intravenous PRN [name], MD        Past Medical History:  Diagnosis Date  . Anemia   . Atherosclerosis   . Chronic kidney disease   . Diabetes mellitus without complication ([place])   . Hypercholesteremia   . Hypertension   . Hypotension   . Stroke [facility])     Past Surgical History:  Procedure Laterality Date  . A/V FISTULAGRAM Left 01/09/2017   Procedure: A/V Fistulagram;  Surgeon: [name], MD;  Location:  [place] [facility];  Service: Cardiovascular;  Laterality: Left;  . AMPUTATION Right 04/25/2016   Procedure: TOE AMPUTATION;  Surgeon: Katha Cabal, MD;  Location: [facility] ORS;  Service: Vascular;  Laterality: Right;  . APPLICATION OF WOUND VAC Right 04/25/2016   Procedure: APPLICATION OF WOUND VAC;  Surgeon: Katha Cabal, MD;  Location: [facility] ORS;  Service: Vascular;  Laterality: Right;  . AV FISTULA PLACEMENT Left 2015  . CARDIAC CATHETERIZATION N/A 04/30/2016   Procedure: Left Heart Cath and Coronary Angiography;  Surgeon: Wellington Hampshire, MD;  Location: [place] [facility] LAB;  Service: Cardiovascular;  Laterality: N/A;  . CATARACT EXTRACTION, BILATERAL    . LEG AMPUTATION BELOW KNEE Left   . PERIPHERAL VASCULAR CATHETERIZATION N/A 10/03/2015   Procedure: A/V Shuntogram/Fistulagram;  Surgeon: [name], MD;  Location: [place] [facility] LAB;  Service: Cardiovascular;  Laterality: N/A;  . PERIPHERAL VASCULAR CATHETERIZATION N/A 01/16/2016   Procedure: Abdominal Aortogram w/Lower Extremity;  Surgeon: [name], MD;  Location: [place] CV LAB;  Service: Cardiovascular;  Laterality: N/A;  . PERIPHERAL VASCULAR CATHETERIZATION  01/16/2016   Procedure: Lower Extremity Intervention;  Surgeon: [name], MD;  Location: [place] CV LAB;  Service: Cardiovascular;;  . PERIPHERAL VASCULAR CATHETERIZATION Right 04/27/2016   Procedure: Lower Extremity Angiography;  Surgeon: Katha Cabal, MD;  Location: [place] [facility] LAB;  Service: Cardiovascular;  Laterality: Right;  . PERIPHERAL VASCULAR CATHETERIZATION  04/27/2016   Procedure: Lower Extremity Intervention;  Surgeon: Katha Cabal, MD;  Location: [place] CV LAB;  Service: Cardiovascular;;  . TRANSMETATARSAL AMPUTATION Right 04/28/2016   Procedure: TRANSMETATARSAL AMPUTATION;  Surgeon: Albertine Patricia, DPM;  Location: [facility] ORS;  Service: Podiatry;  Laterality: Right;  . WOUND DEBRIDEMENT Right 04/25/2016   Procedure:  DEBRIDEMENT WOUND;  Surgeon: Katha Cabal, MD;  Location: [facility] ORS;  Service: Vascular;  Laterality: Right;    Social History Social History  Substance Use Topics  . Smoking status: Never Smoker  . Smokeless tobacco: Never Used  . Alcohol use No    Family History Family History  Problem Relation Age of Onset  . Diabetes Father   . Prostate cancer Father     No family history of bleeding or clotting disorders, autoimmune disease or porphyria  No Known Allergies   REVIEW OF SYSTEMS (Negative unless checked)  Constitutional: [] Weight loss  [] Fever  [] Chills Cardiac: [] Chest pain   [] Chest pressure   [] Palpitations   [] Shortness of breath when laying flat   [] Shortness of breath at rest   [x] Shortness of breath with exertion. Vascular:  [] Pain in legs with walking   [] Pain in legs at rest   [] Pain in legs when laying flat   [] Claudication   [] Pain in feet when walking  [] Pain in feet at rest  [] Pain in feet when laying flat   [] History of DVT   [] Phlebitis   [] Swelling in legs   [] Varicose veins   [] Non-healing ulcers Pulmonary:   [] Uses home oxygen   [] Productive cough   [] Hemoptysis   [] Wheeze  [] COPD   [] Asthma Neurologic:  [x] Dizziness  [] Blackouts   [] Seizures   [x] History of stroke   [] History of TIA  [] Aphasia   [] Temporary blindness   [] Dysphagia   [] Weakness or numbness in arms   [] Weakness or numbness in legs Musculoskeletal:  [] Arthritis   [] Joint swelling   [] Joint pain   [] Low back pain Hematologic:  [] Easy bruising  [] Easy bleeding   [] Hypercoagulable state   [x] Anemic  [] Hepatitis Gastrointestinal:  [] Blood in stool   [] Vomiting blood  [] Gastroesophageal reflux/heartburn   [] Difficulty swallowing. Genitourinary:  [x] Chronic kidney disease   [] Difficult urination  [] Frequent urination  [] Burning with urination   [] Blood in urine Skin:  [] Rashes   [] Ulcers   [] Wounds Psychological:  [] History of anxiety   []  History of major depression.  Physical  Examination  Vitals:   06/06/17 0733  BP: 104/77  Resp: 16  Temp: 98.5 F (36.9 C)  SpO2: 98%  Weight: 116.6 kg (257 lb)  Height: 5\' 7"  (1.702 m)   Body mass index is 40.25 kg/m. Gen: WD/WN, NAD. Obese Head: /AT, No temporalis wasting.  Ear/Nose/Throat: Hearing grossly intact, nares w/o erythema or drainage, oropharynx w/o Erythema/Exudate,  Eyes: Conjunctiva clear, sclera non-icteric Neck: Trachea midline.  No JVD.  Pulmonary:  Good air movement, respirations not labored, no use of accessory muscles.  Cardiac: RRR, normal S1, S2. Vascular: weak thrill in left arm AVF Vessel Right Left  Radial Palpable Palpable  Ulnar Not Palpable Not Palpable  Brachial Palpable Palpable  Carotid Palpable, without bruit Palpable, without bruit   Musculoskeletal:  Extremities without ischemic changes.  No deformity or atrophy.  Neurologic: Sensation grossly intact in extremities.  Symmetrical.  Speech is fluent Psychiatric: Judgment intact, Mood & affect appropriate for pt's clinical situation. Dermatologic: No  rashes or ulcers noted.  No cellulitis or open wounds.    CBC Lab Results  Component Value Date   WBC 9.6 05/01/2016   HGB 7.5 (L) 05/01/2016   HCT 23.5 (L) 05/01/2016   MCV 87.8 05/01/2016   PLT 244 05/01/2016    BMET    Component Value Date/Time   NA 138 05/01/2016 0500   K 4.5 05/01/2016 0500   K 6.6 (HH) 03/10/2013 1351   CL 98 (L) 05/01/2016 0500   CO2 30 05/01/2016 0500   GLUCOSE 105 (H) 05/01/2016 0500   BUN 40 (H) 05/01/2016 0500   CREATININE 6.26 (H) 05/01/2016 0500   CALCIUM 8.3 (L) 05/01/2016 0500   GFRNONAA 9 (L) 05/01/2016 0500   GFRAA 10 (L) 05/01/2016 0500   CrCl cannot be calculated (Patient's most recent lab result is older than the maximum 21 days allowed.).  COAG Lab Results  Component Value Date   INR 1.47 04/30/2016   INR 1.31 04/23/2016    Radiology No results found.  Assessment/Plan 1.  Complication dialysis device with thrombosis  AV access:  Patient's left arm dialysis access is malfunctioning. The patient will undergo angiography and correction of any problems using interventional techniques with the hope of restoring function to the access.  The risks and benefits were described to the patient.  All questions were answered.  The patient agrees to proceed with angiography and intervention. Potassium will be drawn to ensure that it is an appropriate level prior to performing intervention. 2.  End-stage renal disease requiring hemodialysis:  Patient will continue dialysis therapy without further interruption if a successful intervention is not achieved then a tunneled catheter will be placed. Dialysis has already been arranged. 3.  Hypertension:  Patient will continue medical management; nephrology is following no changes in oral medications. 4. Diabetes mellitus:  Glucose will be monitored and oral medications been held this morning once the patient has undergone the patient's procedure po intake will be reinitiated and again Accu-Cheks will be used to assess the blood glucose level and treat as needed. The patient will be restarted on the patient's usual hypoglycemic regime 5.  Coronary artery disease:  EKG will be monitored. Nitrates will be used if needed. The patient's oral cardiac medications will be continued.    Leotis Pain, MD  06/06/2017 8:09 AM

## 2017-06-06 NOTE — OR Nursing (Signed)
Office was called and an appointment made for 07-25-17 at 10:45 am for HDA and to see Dr. Lucky Cowboy. The interpreter Marva Panda was emailed with the appointment details and time. She was going to call the patient to let them know about the appointment.

## 2017-06-06 NOTE — Op Note (Signed)
[facility]    OPERATIVE NOTE   PROCEDURE: 1.   Left radiocephalic arteriovenous fistula cannulation under ultrasound guidance 2.   left arm fistulagram including central venogram 3.   Percutaneous transluminal angioplasty of perianastomotic cephalic vein with 5 mm diameter by 6 cm length Lutonix drug-coated angioplasty balloon  PRE-OPERATIVE DIAGNOSIS: 1. ESRD 2. Poorly functional left radiocephalic AVF  POST-OPERATIVE DIAGNOSIS: same as above   SURGEON: Leotis Pain, MD  ANESTHESIA: local with MCS  ESTIMATED BLOOD LOSS: minimal  FINDING(S): 1. There was what appeared to be a pretty high-grade stenosis of greater than 56% in the cephalic vein just beyond the radiocephalic anastomosis. There was a lesser degree of narrowing about 3 cm beyond this point aneurysmal segments. The remainder of the fistula was patent without significant stenosis. There was dual outflow in the upper arm through both basilic and the cephalic vein in the central venous circulation was patent.  SPECIMEN(S):  None  CONTRAST: 20 cc  FLUORO TIME: 1.7 minutes  MODERATE CONSCIOUS SEDATION TIME: Approximately 20 minutes with 2 mg of Versed and 50 mcg of Fentanyl   INDICATIONS: [name] is a 56 y.o. male who presents with malfunctioning left radiocephalic arteriovenous fistula.  The patient is scheduled for left arm fistulagram.  The patient is aware the risks include but are not limited to: bleeding, infection, thrombosis of the cannulated access, and possible anaphylactic reaction to the contrast.  The patient is aware of the risks of the procedure and elects to proceed forward.  DESCRIPTION: After full informed written consent was obtained, the patient was brought back to the angiography suite and placed supine upon the angiography table.  The patient was connected to monitoring equipment. Moderate conscious sedation was administered with a face to face encounter with the patient  throughout the procedure with my supervision of the RN administering medicines and monitoring the patient's vital signs and mental status throughout from the start of the procedure until the patient was taken to the recovery room. The left arm was prepped and draped in the standard fashion for a percutaneous access intervention.  Under ultrasound guidance, the left radiocephalic arteriovenous fistula was cannulated with a micropuncture needle under direct ultrasound guidance and a permanent image was performed.  The microwire was advanced into the fistula and the needle was exchanged for the a microsheath.  I then upsized to a 6 Fr Sheath and imaging was performed.  Hand injections were completed to image the access including the central venous system. This demonstrated what appeared to be a pretty high-grade stenosis of greater than 81% in the cephalic vein just beyond the radiocephalic anastomosis. There was a lesser degree of narrowing about 3 cm beyond this point aneurysmal segments. The remainder of the fistula was patent without significant stenosis. There was dual outflow in the upper arm through both basilic and the cephalic vein in the central venous circulation was patent.  Based on the images, this patient will need intervention to this area. I then gave the patient 3000 units of intravenous heparin.  I then crossed the stenosis with a Magic Tourqe wire.  Based on the imaging, a 5 mm x 6 cm  Lutonix drug-coated angioplasty balloon was selected.  The balloon was centered around the perianastomotic cephalic vein stenosis and inflated to 14 ATM for 1 minute(s).  On completion imaging, a 25-30% residual stenosis was present but this was markedly improved and I did not want to inflated and the larger for fear  of disrupting the anastomosis..     Based on the completion imaging, no further intervention is necessary.  The wire and balloon were removed from the sheath.  A 4-0 Monocryl purse-string suture was  sewn around the sheath.  The sheath was removed while tying down the suture.  A sterile bandage was applied to the puncture site.  COMPLICATIONS: None  CONDITION: Stable   Leotis Pain  06/06/2017 9:10 AM   This note was created with Dragon Medical transcription system. Any errors in dictation are purely unintentional.

## 2017-06-07 ENCOUNTER — Encounter: Payer: Self-pay | Admitting: Vascular Surgery

## 2017-07-05 ENCOUNTER — Encounter (INDEPENDENT_AMBULATORY_CARE_PROVIDER_SITE_OTHER): Payer: Medicare Other

## 2017-07-05 ENCOUNTER — Ambulatory Visit (INDEPENDENT_AMBULATORY_CARE_PROVIDER_SITE_OTHER): Payer: Medicare Other | Admitting: Vascular Surgery

## 2017-07-24 ENCOUNTER — Other Ambulatory Visit (INDEPENDENT_AMBULATORY_CARE_PROVIDER_SITE_OTHER): Payer: Self-pay | Admitting: Vascular Surgery

## 2017-07-24 DIAGNOSIS — N186 End stage renal disease: Secondary | ICD-10-CM

## 2017-07-25 ENCOUNTER — Encounter (INDEPENDENT_AMBULATORY_CARE_PROVIDER_SITE_OTHER): Payer: Medicare Other | Admitting: Vascular Surgery

## 2017-07-25 ENCOUNTER — Encounter (INDEPENDENT_AMBULATORY_CARE_PROVIDER_SITE_OTHER): Payer: Medicare Other

## 2017-08-05 ENCOUNTER — Encounter (INDEPENDENT_AMBULATORY_CARE_PROVIDER_SITE_OTHER): Payer: Self-pay

## 2017-08-09 ENCOUNTER — Other Ambulatory Visit (INDEPENDENT_AMBULATORY_CARE_PROVIDER_SITE_OTHER): Payer: Self-pay | Admitting: Vascular Surgery

## 2017-08-14 MED ORDER — CEFAZOLIN SODIUM-DEXTROSE 1-4 GM/50ML-% IV SOLN
1.0000 g | Freq: Once | INTRAVENOUS | Status: AC
  Administered 2017-08-15: 1 g via INTRAVENOUS

## 2017-08-15 ENCOUNTER — Encounter
Admission: RE | Disposition: A | Discharge status: Home / Self Care | Payer: Self-pay | Source: Ambulatory Visit | Attending: Vascular Surgery

## 2017-08-15 ENCOUNTER — Ambulatory Visit
Admission: RE | Admit: 2017-08-15 | Discharge: 2017-08-15 | Disposition: A | Discharge status: Home / Self Care | Payer: Medicare Other | Source: Ambulatory Visit | Attending: Vascular Surgery | Admitting: Vascular Surgery

## 2017-08-15 DIAGNOSIS — I2510 Atherosclerotic heart disease of native coronary artery without angina pectoris: Secondary | ICD-10-CM | POA: Insufficient documentation

## 2017-08-15 DIAGNOSIS — T82868A Thrombosis of vascular prosthetic devices, implants and grafts, initial encounter: Secondary | ICD-10-CM

## 2017-08-15 DIAGNOSIS — E7800 Pure hypercholesterolemia, unspecified: Secondary | ICD-10-CM | POA: Insufficient documentation

## 2017-08-15 DIAGNOSIS — E119 Type 2 diabetes mellitus without complications: Secondary | ICD-10-CM | POA: Diagnosis not present

## 2017-08-15 DIAGNOSIS — E1122 Type 2 diabetes mellitus with diabetic chronic kidney disease: Secondary | ICD-10-CM | POA: Insufficient documentation

## 2017-08-15 DIAGNOSIS — Z9842 Cataract extraction status, left eye: Secondary | ICD-10-CM | POA: Diagnosis not present

## 2017-08-15 DIAGNOSIS — Z8042 Family history of malignant neoplasm of prostate: Secondary | ICD-10-CM | POA: Insufficient documentation

## 2017-08-15 DIAGNOSIS — Z992 Dependence on renal dialysis: Secondary | ICD-10-CM | POA: Insufficient documentation

## 2017-08-15 DIAGNOSIS — Z89421 Acquired absence of other right toe(s): Secondary | ICD-10-CM | POA: Insufficient documentation

## 2017-08-15 DIAGNOSIS — T82590A Other mechanical complication of surgically created arteriovenous fistula, initial encounter: Secondary | ICD-10-CM | POA: Diagnosis not present

## 2017-08-15 DIAGNOSIS — Y832 Surgical operation with anastomosis, bypass or graft as the cause of abnormal reaction of the patient, or of later complication, without mention of misadventure at the time of the procedure: Secondary | ICD-10-CM | POA: Diagnosis not present

## 2017-08-15 DIAGNOSIS — Z8673 Personal history of transient ischemic attack (TIA), and cerebral infarction without residual deficits: Secondary | ICD-10-CM | POA: Diagnosis not present

## 2017-08-15 DIAGNOSIS — N186 End stage renal disease: Secondary | ICD-10-CM

## 2017-08-15 DIAGNOSIS — I120 Hypertensive chronic kidney disease with stage 5 chronic kidney disease or end stage renal disease: Secondary | ICD-10-CM | POA: Diagnosis not present

## 2017-08-15 DIAGNOSIS — Z9841 Cataract extraction status, right eye: Secondary | ICD-10-CM | POA: Diagnosis not present

## 2017-08-15 DIAGNOSIS — Z89512 Acquired absence of left leg below knee: Secondary | ICD-10-CM | POA: Diagnosis not present

## 2017-08-15 DIAGNOSIS — Z955 Presence of coronary angioplasty implant and graft: Secondary | ICD-10-CM | POA: Diagnosis not present

## 2017-08-15 DIAGNOSIS — Z833 Family history of diabetes mellitus: Secondary | ICD-10-CM | POA: Diagnosis not present

## 2017-08-15 DIAGNOSIS — Z9889 Other specified postprocedural states: Secondary | ICD-10-CM | POA: Insufficient documentation

## 2017-08-15 DIAGNOSIS — I10 Essential (primary) hypertension: Secondary | ICD-10-CM | POA: Diagnosis not present

## 2017-08-15 HISTORY — PX: A/V FISTULAGRAM: CATH118298

## 2017-08-15 LAB — GLUCOSE, CAPILLARY: Glucose-Capillary: 169 mg/dL — ABNORMAL HIGH (ref 65–99)

## 2017-08-15 LAB — POTASSIUM (ARMC VASCULAR LAB ONLY): POTASSIUM (ARMC VASCULAR LAB): 5.1 (ref 3.5–5.1)

## 2017-08-15 SURGERY — A/V FISTULAGRAM
Anesthesia: Moderate Sedation | Site: Arm Upper | Laterality: Left

## 2017-08-15 MED ORDER — HYDROMORPHONE HCL 1 MG/ML IJ SOLN: 1.0000 mg | Freq: Once | INTRAMUSCULAR | Status: DC | PRN

## 2017-08-15 MED ORDER — SODIUM CHLORIDE 0.9 % IV SOLN: INTRAVENOUS | Status: DC

## 2017-08-15 MED ORDER — IOPAMIDOL (ISOVUE-300) INJECTION 61%
INTRAVENOUS | Status: DC | PRN
  Administered 2017-08-15: 20 mL via INTRA_ARTERIAL

## 2017-08-15 MED ORDER — FENTANYL CITRATE (PF) 100 MCG/2ML IJ SOLN
INTRAMUSCULAR | Status: AC
  Filled 2017-08-15: qty 2

## 2017-08-15 MED ORDER — MIDAZOLAM HCL 5 MG/5ML IJ SOLN
INTRAMUSCULAR | Status: AC
  Filled 2017-08-15: qty 5

## 2017-08-15 MED ORDER — LIDOCAINE-EPINEPHRINE (PF) 1 %-1:200000 IJ SOLN
INTRAMUSCULAR | Status: AC
  Filled 2017-08-15: qty 30

## 2017-08-15 MED ORDER — FENTANYL CITRATE (PF) 100 MCG/2ML IJ SOLN
INTRAMUSCULAR | Status: DC | PRN
Start: 2017-08-15 — End: 2017-08-15
  Administered 2017-08-15: 50 ug via INTRAVENOUS

## 2017-08-15 MED ORDER — ONDANSETRON HCL 4 MG/2ML IJ SOLN: 4.0000 mg | Freq: Four times a day (QID) | INTRAMUSCULAR | Status: DC | PRN

## 2017-08-15 MED ORDER — METHYLPREDNISOLONE SODIUM SUCC 125 MG IJ SOLR: 125.0000 mg | INTRAMUSCULAR | Status: DC | PRN

## 2017-08-15 MED ORDER — HEPARIN (PORCINE) IN NACL 2-0.9 UNIT/ML-% IJ SOLN
INTRAMUSCULAR | Status: AC
  Filled 2017-08-15: qty 1000

## 2017-08-15 MED ORDER — MIDAZOLAM HCL 2 MG/2ML IJ SOLN
INTRAMUSCULAR | Status: DC | PRN
  Administered 2017-08-15: 2 mg via INTRAVENOUS

## 2017-08-15 MED ORDER — FAMOTIDINE 20 MG PO TABS: 40.0000 mg | ORAL_TABLET | ORAL | Status: DC | PRN

## 2017-08-15 MED ORDER — HEPARIN SODIUM (PORCINE) 1000 UNIT/ML IJ SOLN
INTRAMUSCULAR | Status: AC
  Filled 2017-08-15: qty 1

## 2017-08-15 SURGICAL SUPPLY — 7 items
CANNULA 5F STIFF (CANNULA) ×3 IMPLANT
COVER PROBE U/S 5X48 (MISCELLANEOUS) ×3 IMPLANT
DRAPE BRACHIAL (DRAPES) ×3 IMPLANT
PACK ANGIOGRAPHY (CUSTOM PROCEDURE TRAY) ×3 IMPLANT
SHEATH BRITE TIP 6FRX5.5 (SHEATH) ×3 IMPLANT
SUT MNCRL AB 4-0 PS2 18 (SUTURE) ×3 IMPLANT
TOWEL OR 17X26 4PK STRL BLUE (TOWEL DISPOSABLE) ×3 IMPLANT

## 2017-08-15 NOTE — Op Note (Signed)
[facility]    OPERATIVE NOTE   PROCEDURE: 1.   Left radiocephalic arteriovenous fistula cannulation under ultrasound guidance 2.   Left arm fistulagram including central venogram   PRE-OPERATIVE DIAGNOSIS: 1. ESRD 2. Poorly functional left radiocephalic AVF  POST-OPERATIVE DIAGNOSIS: same as above   SURGEON: Leotis Pain, MD  ANESTHESIA: local with MCS  ESTIMATED BLOOD LOSS: 3 cc  FINDING(S): 1. No significant stenosis seen throughout the forearm cephalic vein, anastomosis, and dual outflow in the upper arm through both the basilic and the cephalic vein without significant stenosis.  Patent central venous circulation.  SPECIMEN(S):  None  CONTRAST: 20 cc  FLUORO TIME: Less than 1 minute  MODERATE CONSCIOUS SEDATION TIME: Approximately 15 minutes with 2 mg of Versed and 50 Mcg of Fentanyl   INDICATIONS: [name] is a 56 y.o. male who presents with malfunctioning left brachiocephalic arteriovenous fistula.  The patient is scheduled for left arm fistulagram at the request of his dialysis access center.  The patient is aware the risks include but are not limited to: bleeding, infection, thrombosis of the cannulated access, and possible anaphylactic reaction to the contrast.  The patient is aware of the risks of the procedure and elects to proceed forward.  DESCRIPTION: After full informed written consent was obtained, the patient was brought back to the angiography suite and placed supine upon the angiography table.  The patient was connected to monitoring equipment. Moderate conscious sedation was administered with a face to face encounter with the patient throughout the procedure with my supervision of the RN administering medicines and monitoring the patient's vital signs and mental status throughout from the start of the procedure until the patient was taken to the recovery room. The left arm was prepped and draped in the standard fashion for a  percutaneous access intervention.  Under ultrasound guidance, the left brachiocephalic arteriovenous fistula was cannulated with a micropuncture needle under direct ultrasound guidance and a permanent image was performed.  The microwire was advanced into the fistula and the needle was exchanged for the a microsheath. Hand injections were completed to image the access including the central venous system. This demonstrated no significant stenosis seen throughout the forearm cephalic vein, anastomosis, and dual outflow in the upper arm through both the basilic and the cephalic vein without significant stenosis.  Patent central venous circulation.  Based on the images, this patient will need no intervention.  A 4-0 Monocryl purse-string suture was sewn around the sheath.  The sheath was removed while tying down the suture.  A sterile bandage was applied to the puncture site.  COMPLICATIONS: None  CONDITION: Stable   Leotis Pain  08/15/2017 9:43 AM   This note was created with Dragon Medical transcription system. Any errors in dictation are purely unintentional.

## 2017-08-15 NOTE — Progress Notes (Signed)
Pt sitting up in bed eating and drinking at this time in NAD, Dr. Lucky Cowboy at bedside w/ interpreter to discuss procedure, discharge and follow up instructions reviewed with pt and family w/ help of interpreter, pt and family verbalize understanding.

## 2017-08-15 NOTE — H&P (Signed)
[facility] Admission History & Physical  MRN : [id number]  [name] is a 56 y.o. (1961-07-13) male who presents with chief complaint of No chief complaint on file. Marland Kitchen  History of Present Illness: I am asked to evaluate the patient by the dialysis center. The patient was sent here because they were unable to achieve adequate dialysis this morning. Furthermore the Center states there is very poor thrill and bruit. The patient states there there have been increasing problems with the access, such as "pulling clots" during dialysis and prolonged bleeding after decannulation. The patient estimates these problems have been going on for several weeks. The patient is unaware of any other change.  Patient denies pain or tenderness overlying the access.  There is no pain with dialysis.  The patient denies hand pain or finger pain consistent with steal syndrome.   There have been many past interventions or declots of this access.  The patient is not chronically hypotensive on dialysis.  Current Facility-Administered Medications  Medication Dose Route Frequency Provider Last Rate Last Dose  . 0.9 %  sodium chloride infusion   Intravenous Continuous Stegmayer, Kimberly A, PA-C      . ceFAZolin (ANCEF) IVPB 1 g/50 mL premix  1 g Intravenous Once Stegmayer, Kimberly A, PA-C      . famotidine (PEPCID) tablet 40 mg  40 mg Oral PRN Stegmayer, Janalyn Harder, PA-C      . HYDROmorphone (DILAUDID) injection 1 mg  1 mg Intravenous Once PRN Stegmayer, Kimberly A, PA-C      . methylPREDNISolone sodium succinate (SOLU-MEDROL) 125 mg/2 mL injection 125 mg  125 mg Intravenous PRN Stegmayer, Kimberly A, PA-C      . ondansetron (ZOFRAN) injection 4 mg  4 mg Intravenous Q6H PRN Stegmayer, Janalyn Harder, PA-C        Past Medical History:  Diagnosis Date  . Anemia   . Atherosclerosis   . Chronic kidney disease   . Diabetes mellitus without complication ([place])   . Hypercholesteremia   .  Hypertension   . Hypotension   . Stroke [facility])     Past Surgical History:  Procedure Laterality Date  . A/V FISTULAGRAM Left 01/09/2017   Procedure: A/V Fistulagram;  Surgeon: [name], MD;  Location: [facility];  Service: Cardiovascular;  Laterality: Left;  . A/V FISTULAGRAM Left 06/06/2017   Procedure: A/V Fistulagram;  Surgeon: [name], MD;  Location: [place] [facility];  Service: Cardiovascular;  Laterality: Left;  . A/V SHUNT INTERVENTION N/A 06/06/2017   Procedure: A/V SHUNT INTERVENTION;  Surgeon: [name], MD;  Location: [facility];  Service: Cardiovascular;  Laterality: N/A;  . AMPUTATION Right 04/25/2016   Procedure: TOE AMPUTATION;  Surgeon: Katha Cabal, MD;  Location: [facility];  Service: Vascular;  Laterality: Right;  . APPLICATION OF WOUND VAC Right 04/25/2016   Procedure: APPLICATION OF WOUND VAC;  Surgeon: Katha Cabal, MD;  Location: [facility];  Service: Vascular;  Laterality: Right;  . AV FISTULA PLACEMENT Left 2015  . CARDIAC CATHETERIZATION N/A 04/30/2016   Procedure: Left Heart Cath and Coronary Angiography;  Surgeon: Wellington Hampshire, MD;  Location: [facility];  Service: Cardiovascular;  Laterality: N/A;  . CATARACT EXTRACTION, BILATERAL    . LEG AMPUTATION BELOW KNEE Left   . PERIPHERAL VASCULAR CATHETERIZATION N/A 10/03/2015   Procedure: A/V Shuntogram/Fistulagram;  Surgeon: [name], MD;  Location: [facility];  Service:  Cardiovascular;  Laterality: N/A;  . PERIPHERAL VASCULAR CATHETERIZATION N/A 01/16/2016   Procedure: Abdominal Aortogram w/Lower Extremity;  Surgeon: [name], MD;  Location: [facility];  Service: Cardiovascular;  Laterality: N/A;  . PERIPHERAL VASCULAR CATHETERIZATION  01/16/2016   Procedure: Lower Extremity Intervention;  Surgeon: [name], MD;  Location: [facility];  Service: Cardiovascular;;  . PERIPHERAL VASCULAR CATHETERIZATION Right 04/27/2016   Procedure: Lower  Extremity Angiography;  Surgeon: Katha Cabal, MD;  Location: [facility];  Service: Cardiovascular;  Laterality: Right;  . PERIPHERAL VASCULAR CATHETERIZATION  04/27/2016   Procedure: Lower Extremity Intervention;  Surgeon: Katha Cabal, MD;  Location: [facility];  Service: Cardiovascular;;  . TRANSMETATARSAL AMPUTATION Right 04/28/2016   Procedure: TRANSMETATARSAL AMPUTATION;  Surgeon: Albertine Patricia, DPM;  Location: [facility];  Service: Podiatry;  Laterality: Right;  . WOUND DEBRIDEMENT Right 04/25/2016   Procedure: DEBRIDEMENT WOUND;  Surgeon: Katha Cabal, MD;  Location: [facility];  Service: Vascular;  Laterality: Right;    Social History Social History  Substance Use Topics  . Smoking status: Never Smoker  . Smokeless tobacco: Never Used  . Alcohol use No    Family History Family History  Problem Relation Age of Onset  . Diabetes Father   . Prostate cancer Father     No family history of bleeding or clotting disorders, autoimmune disease or porphyria  No Known Allergies   REVIEW OF SYSTEMS (Negative unless checked)  Constitutional: [] Weight loss  [] Fever  [] Chills Cardiac: [] Chest pain   [] Chest pressure   [] Palpitations   [] Shortness of breath when laying flat   [] Shortness of breath at rest   [x] Shortness of breath with exertion. Vascular:  [] Pain in legs with walking   [] Pain in legs at rest   [] Pain in legs when laying flat   [] Claudication   [] Pain in feet when walking  [] Pain in feet at rest  [] Pain in feet when laying flat   [] History of DVT   [] Phlebitis   [] Swelling in legs   [] Varicose veins   [x] Non-healing ulcers Pulmonary:   [] Uses home oxygen   [] Productive cough   [] Hemoptysis   [] Wheeze  [] COPD   [] Asthma Neurologic:  [] Dizziness  [] Blackouts   [] Seizures   [x] History of stroke   [] History of TIA  [] Aphasia   [] Temporary blindness   [] Dysphagia   [x] Weakness or numbness in arms   [x] Weakness or numbness in legs Musculoskeletal:   [] Arthritis   [] Joint swelling   [] Joint pain   [] Low back pain Hematologic:  [] Easy bruising  [] Easy bleeding   [] Hypercoagulable state   [x] Anemic  [] Hepatitis Gastrointestinal:  [] Blood in stool   [] Vomiting blood  [] Gastroesophageal reflux/heartburn   [] Difficulty swallowing. Genitourinary:  [x] Chronic kidney disease   [] Difficult urination  [] Frequent urination  [] Burning with urination   [] Blood in urine Skin:  [] Rashes   [] Ulcers   [] Wounds Psychological:  [] History of anxiety   []  History of major depression.  Physical Examination  There were no vitals filed for this visit. There is no height or weight on file to calculate BMI. Gen: WD/WN, NAD Head: [place]/AT, No temporalis wasting. Prominent temp pulse not noted. Ear/Nose/Throat: Hearing grossly intact, nares w/o erythema or drainage, oropharynx w/o Erythema/Exudate,  Eyes: Conjunctiva clear, sclera non-icteric Neck: Trachea midline.  No JVD.  Pulmonary:  Good air movement, respirations not labored, no use of accessory muscles.  Cardiac: RRR, normal S1, S2. Vascular: access is pulsatile Vessel  Right Left  Radial Palpable Palpable               Musculoskeletal: M/S 5/5 throughout.  Extremities without ischemic changes.  No deformity or atrophy.  Neurologic: Sensation grossly intact in extremities.  Symmetrical.  Speech is fluent. Motor exam as listed above. Psychiatric: Judgment intact, Mood & affect appropriate for pt's clinical situation. Dermatologic: No rashes or ulcers noted.  No cellulitis or open wounds.    CBC Lab Results  Component Value Date   WBC 9.6 05/01/2016   HGB 7.5 (L) 05/01/2016   HCT 23.5 (L) 05/01/2016   MCV 87.8 05/01/2016   PLT 244 05/01/2016    BMET    Component Value Date/Time   NA 138 05/01/2016 0500   K 4.5 05/01/2016 0500   K 6.6 (HH) 03/10/2013 1351   CL 98 (L) 05/01/2016 0500   CO2 30 05/01/2016 0500   GLUCOSE 105 (H) 05/01/2016 0500   BUN 40 (H) 05/01/2016 0500   CREATININE 6.26  (H) 05/01/2016 0500   CALCIUM 8.3 (L) 05/01/2016 0500   GFRNONAA 9 (L) 05/01/2016 0500   GFRAA 10 (L) 05/01/2016 0500   CrCl cannot be calculated (Patient's most recent lab result is older than the maximum 21 days allowed.).  COAG Lab Results  Component Value Date   INR 1.47 04/30/2016   INR 1.31 04/23/2016    Radiology No results found.  Assessment/Plan 1.  Complication dialysis device with dysfunction of AV access:  Patient's dialysis access is malfunctioning. The patient will undergo angiography and correction of any problems using interventional techniques with the hope of restoring function to the access.  The risks and benefits were described to the patient.  All questions were answered.  The patient agrees to proceed with angiography and intervention. Potassium will be drawn to ensure that it is an appropriate level prior to performing intervention. 2.  End-stage renal disease requiring hemodialysis:  Patient will continue dialysis therapy without further interruption if a successful intervention is not achieved then a tunneled catheter will be placed. Dialysis has already been arranged. 3.  Hypertension:  Patient will continue medical management; nephrology is following no changes in oral medications. 4. Diabetes mellitus:  Glucose will be monitored and oral medications been held this morning once the patient has undergone the patient's procedure po intake will be reinitiated and again Accu-Cheks will be used to assess the blood glucose level and treat as needed. The patient will be restarted on the patient's usual hypoglycemic regime     Leotis Pain, MD  08/15/2017 8:29 AM

## 2017-08-15 NOTE — Discharge Instructions (Signed)

## 2017-08-16 ENCOUNTER — Encounter: Payer: Self-pay | Admitting: Vascular Surgery

## 2017-08-22 ENCOUNTER — Encounter (INDEPENDENT_AMBULATORY_CARE_PROVIDER_SITE_OTHER): Payer: Medicare Other

## 2017-08-22 ENCOUNTER — Ambulatory Visit (INDEPENDENT_AMBULATORY_CARE_PROVIDER_SITE_OTHER): Payer: Medicare Other | Admitting: Vascular Surgery

## 2017-11-12 ENCOUNTER — Other Ambulatory Visit
Admission: RE | Admit: 2017-11-12 | Discharge: 2017-11-12 | Disposition: A | Discharge status: Home / Self Care | Payer: Medicare Other | Source: Ambulatory Visit | Attending: Gastroenterology | Admitting: Gastroenterology

## 2017-11-12 ENCOUNTER — Other Ambulatory Visit: Payer: Self-pay

## 2017-11-12 ENCOUNTER — Ambulatory Visit (INDEPENDENT_AMBULATORY_CARE_PROVIDER_SITE_OTHER): Payer: Medicare Other | Admitting: Gastroenterology

## 2017-11-12 ENCOUNTER — Encounter (INDEPENDENT_AMBULATORY_CARE_PROVIDER_SITE_OTHER): Payer: Self-pay

## 2017-11-12 DIAGNOSIS — R112 Nausea with vomiting, unspecified: Secondary | ICD-10-CM

## 2017-11-12 DIAGNOSIS — E1165 Type 2 diabetes mellitus with hyperglycemia: Secondary | ICD-10-CM

## 2017-11-12 LAB — HEMOGLOBIN A1C
HEMOGLOBIN A1C: 6.9 % — AB (ref 4.8–5.6)
MEAN PLASMA GLUCOSE: 151.33 mg/dL

## 2017-11-12 NOTE — Progress Notes (Signed)
Kristopher Bellows MD, MRCP(U.K) [address]  [facility]  [place], [place] [address]  Main: [phone]  Fax: [phone]   Gastroenterology Consultation  Referring Provider:     [name], MD Primary Care Physician:  [facility], [facility] Primary Gastroenterologist:  Dr. Jonathon [name]  Reason for Consultation:     Nausea,vomiting and GERD        HPI:   [name] is a 57 y.o. y/o male referred for consultation & management  by Dr. Domingo [name], [facility]     He has been referred for nausea,vomiting and GERD. No recent labs available on Epic. History via telephone interpretor as patient speaks spanish.   Nausea and vomiting began 1 year back , on and off. Sometimes occurs every day. Past 1 week it occurred 4 times. When he throws up , usually its food that is thrown up. Food usually thrown up eaten a few hours prior. Feels full after a few bites. No change of weight last few months.   His blood sugars range : he does not know his blood sugar range- cant recall his last blood sugar when it was checked. He is diabetic. Foul smelling belching . Lots of bloating.      Past Medical History:  Diagnosis Date  . Anemia   . Atherosclerosis   . Chronic kidney disease   . Diabetes mellitus without complication ([place])   . Hypercholesteremia   . Hypertension   . Hypotension   . Stroke [facility])     Past Surgical History:  Procedure Laterality Date  . A/V FISTULAGRAM Left 01/09/2017   Procedure: A/V Fistulagram;  Surgeon: [name], MD;  Location: [place] [facility];  Service: Cardiovascular;  Laterality: Left;  . A/V FISTULAGRAM Left 06/06/2017   Procedure: A/V Fistulagram;  Surgeon: [name], MD;  Location: [place] [facility];  Service: Cardiovascular;  Laterality: Left;  . A/V FISTULAGRAM Left 08/15/2017   Procedure: A/V Fistulagram;  Surgeon: [name], MD;  Location: [place] [facility];  Service: Cardiovascular;  Laterality: Left;  .  A/V SHUNT INTERVENTION N/A 06/06/2017   Procedure: A/V SHUNT INTERVENTION;  Surgeon: [name], MD;  Location: [place] [facility];  Service: Cardiovascular;  Laterality: N/A;  . AMPUTATION Right 04/25/2016   Procedure: TOE AMPUTATION;  Surgeon: Katha Cabal, MD;  Location: [facility];  Service: Vascular;  Laterality: Right;  . APPLICATION OF WOUND VAC Right 04/25/2016   Procedure: APPLICATION OF WOUND VAC;  Surgeon: Katha Cabal, MD;  Location: [facility];  Service: Vascular;  Laterality: Right;  . AV FISTULA PLACEMENT Left 2015  . CARDIAC CATHETERIZATION N/A 04/30/2016   Procedure: Left Heart Cath and Coronary Angiography;  Surgeon: Wellington Hampshire, MD;  Location: [place] [facility];  Service: Cardiovascular;  Laterality: N/A;  . CATARACT EXTRACTION, BILATERAL    . LEG AMPUTATION BELOW KNEE Left   . PERIPHERAL VASCULAR CATHETERIZATION N/A 10/03/2015   Procedure: A/V Shuntogram/Fistulagram;  Surgeon: [name], MD;  Location: [place] [facility];  Service: Cardiovascular;  Laterality: N/A;  . PERIPHERAL VASCULAR CATHETERIZATION N/A 01/16/2016   Procedure: Abdominal Aortogram w/Lower Extremity;  Surgeon: [name], MD;  Location: [place] [facility];  Service: Cardiovascular;  Laterality: N/A;  . PERIPHERAL VASCULAR CATHETERIZATION  01/16/2016   Procedure: Lower Extremity Intervention;  Surgeon: [name], MD;  Location: [place] [facility];  Service: Cardiovascular;;  . PERIPHERAL VASCULAR CATHETERIZATION Right 04/27/2016  Procedure: Lower Extremity Angiography;  Surgeon: Katha Cabal, MD;  Location: [place] [facility];  Service: Cardiovascular;  Laterality: Right;  . PERIPHERAL VASCULAR CATHETERIZATION  04/27/2016   Procedure: Lower Extremity Intervention;  Surgeon: Katha Cabal, MD;  Location: [place] [facility];  Service: Cardiovascular;;  . TRANSMETATARSAL AMPUTATION Right 04/28/2016   Procedure: TRANSMETATARSAL AMPUTATION;  Surgeon: Albertine Patricia, DPM;   Location: [facility];  Service: Podiatry;  Laterality: Right;  . WOUND DEBRIDEMENT Right 04/25/2016   Procedure: DEBRIDEMENT WOUND;  Surgeon: Katha Cabal, MD;  Location: [facility];  Service: Vascular;  Laterality: Right;    Prior to Admission medications   Medication Sig Start Date End Date Taking? Authorizing Provider  acetaminophen (TYLENOL) 325 MG tablet Take by mouth. 04/02/14   [provider]  amLODipine (NORVASC) 10 MG tablet Take 10 mg by mouth. 07/01/13   [provider]  aspirin EC 81 MG EC tablet Take 1 tablet (81 mg total) by mouth daily. 05/02/16   Gladstone Lighter, MD  atorvastatin (LIPITOR) 20 MG tablet  06/11/16   [provider]  atorvastatin (LIPITOR) 40 MG tablet Take by mouth. 07/01/13   [provider]  b complex-vitamin c-folic acid (NEPHRO-VITE) 0.8 MG TABS tablet Take 1 tablet by mouth daily.    [provider]  bisacodyl (DULCOLAX) 5 MG EC tablet Take 20 mg by mouth daily as needed for moderate constipation.    [provider]  calcium acetate (PHOSLO) 667 MG capsule Take 2,001 mg by mouth 3 (three) times daily with meals. Take 667 mg with snacks    [provider]  calcium carbonate (TUMS) 500 MG chewable tablet Chew by mouth.    [provider]  cinacalcet (SENSIPAR) 60 MG tablet Take 60 mg by mouth 3 (three) times daily.    [provider]  epoetin alfa (PROCRIT) 02409 UNIT/ML injection Inject into the skin. 04/02/14   [provider]  glipiZIDE (GLUCOTROL) 10 MG tablet Take 10 mg by mouth daily.  10/25/16   [provider]  lidocaine-prilocaine (EMLA) cream Apply 1 application topically as needed (port access).    [provider]  midodrine (PROAMATINE) 10 MG tablet Take 10 mg by mouth daily on Mon, Tues, and Fri at dialysis    [provider]  omeprazole (PRILOSEC) 20 MG capsule Take 20 mg by mouth daily.    [provider]  polyethylene glycol  (MIRALAX / GLYCOLAX) packet Take 17 g by mouth daily as needed for mild constipation.    [provider]  sevelamer carbonate (RENVELA) 800 MG tablet Take 800 mg by mouth 3 (three) times daily with meals.    [provider]    Family History  Problem Relation Age of Onset  . Diabetes Father   . Prostate cancer Father      Social History   Tobacco Use  . Smoking status: Never Smoker  . Smokeless tobacco: Never Used  Substance Use Topics  . Alcohol use: No  . Drug use: No    Allergies as of 11/12/2017  . (No Known Allergies)  There were no vitals taken for this visit.   Review of Systems:    All systems reviewed and negative except where noted in HPI.   Physical Exam:  There were no vitals taken for this visit. No LMP for male patient. Psych:  Alert and cooperative. Normal mood and affect. General:   Alert,  Well-developed, well-nourished, pleasant and cooperative in NAD Head:  Normocephalic and atraumatic. Eyes:  Sclera clear, no icterus.   Conjunctiva pink. Ears:  Normal auditory acuity. Nose:  No deformity, discharge, or lesions. Mouth:  No deformity or lesions,oropharynx pink & moist. Neck:  Supple; no masses or thyromegaly. Lungs:  Respirations even and unlabored.  Clear throughout to auscultation.   No wheezes, crackles, or rhonchi. No acute distress. Heart:  Regular rate and rhythm; no murmurs, clicks, rubs, or gallops. Abdomen:  Normal bowel sounds.  No bruits.  Soft, non-tender and non-distended without masses, hepatosplenomegaly or hernias noted.  No guarding or rebound tenderness.    Msk:  Symmetrical without gross deformities. Good, equal movement & strength bilaterally. Pulses:  Normal pulses noted. Extremities:  No clubbing or edema.  No cyanosis. Neurologic:  Alert and oriented x3;  grossly normal neurologically. Skin:  Intact without significant lesions or rashes. No jaundice. Lymph Nodes:  No significant cervical adenopathy. Psych:   Alert and cooperative. Normal mood and affect.  Imaging Studies: No results found.  Assessment and Plan:   [name] is a 57 y.o. y/o male has been referred for nausea and vomiting for over a year. His history is very suggestive of gastroparesis , likely secondary to poorly controlled diabetes. Patient insisted medictions for pain. I informed him that I need the below information to help him with his symptoms. I strongly urged him to see his PCP to obtain his diabetic supplies and have his diabetes addressed. He does not have a follow up visit with his PCP.   Plan  1. Gastric emptying study  2. Continue Omeprazole 20 mg  3. Gastroparesis diet .  4. Tight glycemic control - he should get a glucometer at home to check his sugars and maintain a diary. 5. Check Hba1c 6. Activated charcoal tablets for bloating .    A total face to face time of over 45 minutes was spent and greater than 50% was spend in counselling and coordiation of care with the patient , via interpretor, stressing on need to establish care with PCP, tight glycemic control, effects on gastric emptying , weight loss, maintaining glucose checks and records. The patient wanted a "tablet for the pain", had to explain that we are yet to determine the etiology of his condition and only can advise further based on the results of above testing   Follow up in 3-4 weeks   Dr Kristopher Bellows MD,MRCP(U.K)

## 2017-12-03 ENCOUNTER — Ambulatory Visit
Admission: RE | Admit: 2017-12-03 | Discharge: 2017-12-03 | Disposition: A | Discharge status: Home / Self Care | Payer: Medicare Other | Source: Ambulatory Visit | Attending: Gastroenterology | Admitting: Gastroenterology

## 2017-12-03 DIAGNOSIS — R112 Nausea with vomiting, unspecified: Secondary | ICD-10-CM | POA: Diagnosis present

## 2017-12-03 MED ORDER — TECHNETIUM TC 99M SULFUR COLLOID
2.0000 | Freq: Once | INTRAVENOUS | Status: AC | PRN
  Administered 2017-12-03: 2.89 via ORAL

## 2017-12-06 ENCOUNTER — Encounter: Payer: Self-pay | Admitting: Gastroenterology

## 2017-12-21 ENCOUNTER — Other Ambulatory Visit: Payer: Self-pay

## 2017-12-21 ENCOUNTER — Encounter: Payer: Self-pay | Admitting: Emergency Medicine

## 2017-12-21 ENCOUNTER — Emergency Department
Admission: EM | Admit: 2017-12-21 | Discharge: 2017-12-21 | Disposition: A | Discharge status: Home / Self Care | Payer: Medicare Other | Attending: Emergency Medicine | Admitting: Emergency Medicine

## 2017-12-21 DIAGNOSIS — I132 Hypertensive heart and chronic kidney disease with heart failure and with stage 5 chronic kidney disease, or end stage renal disease: Secondary | ICD-10-CM | POA: Diagnosis not present

## 2017-12-21 DIAGNOSIS — Z79899 Other long term (current) drug therapy: Secondary | ICD-10-CM | POA: Diagnosis not present

## 2017-12-21 DIAGNOSIS — Z7984 Long term (current) use of oral hypoglycemic drugs: Secondary | ICD-10-CM | POA: Insufficient documentation

## 2017-12-21 DIAGNOSIS — Z89421 Acquired absence of other right toe(s): Secondary | ICD-10-CM | POA: Insufficient documentation

## 2017-12-21 DIAGNOSIS — K0889 Other specified disorders of teeth and supporting structures: Secondary | ICD-10-CM | POA: Diagnosis present

## 2017-12-21 DIAGNOSIS — N186 End stage renal disease: Secondary | ICD-10-CM | POA: Diagnosis not present

## 2017-12-21 DIAGNOSIS — I5032 Chronic diastolic (congestive) heart failure: Secondary | ICD-10-CM | POA: Insufficient documentation

## 2017-12-21 DIAGNOSIS — E1140 Type 2 diabetes mellitus with diabetic neuropathy, unspecified: Secondary | ICD-10-CM | POA: Insufficient documentation

## 2017-12-21 DIAGNOSIS — E1122 Type 2 diabetes mellitus with diabetic chronic kidney disease: Secondary | ICD-10-CM | POA: Diagnosis not present

## 2017-12-21 DIAGNOSIS — Z7982 Long term (current) use of aspirin: Secondary | ICD-10-CM | POA: Insufficient documentation

## 2017-12-21 DIAGNOSIS — Z8673 Personal history of transient ischemic attack (TIA), and cerebral infarction without residual deficits: Secondary | ICD-10-CM | POA: Insufficient documentation

## 2017-12-21 DIAGNOSIS — Z992 Dependence on renal dialysis: Secondary | ICD-10-CM | POA: Diagnosis not present

## 2017-12-21 DIAGNOSIS — K047 Periapical abscess without sinus: Secondary | ICD-10-CM | POA: Insufficient documentation

## 2017-12-21 MED ORDER — CEFTRIAXONE SODIUM 1 G IJ SOLR
1.0000 g | Freq: Once | INTRAMUSCULAR | Status: AC
  Administered 2017-12-21: 1 g via INTRAMUSCULAR
  Filled 2017-12-21: qty 10

## 2017-12-21 MED ORDER — LIDOCAINE HCL (PF) 1 % IJ SOLN
2.1000 mL | Freq: Once | INTRAMUSCULAR | Status: AC
  Administered 2017-12-21: 2.1 mL via INTRADERMAL
  Filled 2017-12-21: qty 5

## 2017-12-21 MED ORDER — AMOXICILLIN-POT CLAVULANATE 875-125 MG PO TABS: 1.0000 | ORAL_TABLET | Freq: Two times a day (BID) | ORAL | 0 refills | Status: DC

## 2017-12-21 NOTE — ED Provider Notes (Signed)
[facility] Emergency Department Provider Note ____________________________________________  Time seen: Approximately 1:02 PM  I have reviewed the triage vital signs and the nursing notes.   HISTORY  Chief Complaint Abscess   HPI [name] is a 57 y.o. male who presents to the emergency department for treatment and evaluation of right-sided facial swelling and dental pain.  He was placed on amoxicillin yesterday after being diagnosed with a dental abscess and states that overnight the swelling has gotten worse.  He has not had any increase in pain and states that he has been taking ibuprofen.  He has only had 2 doses of the amoxicillin.  He denies fever.   Past Medical History:  Diagnosis Date  . Anemia   . Atherosclerosis   . Chronic kidney disease   . Diabetes mellitus without complication ([place])   . Hypercholesteremia   . Hypertension   . Hypotension   . Stroke [facility])     Patient Active Problem List   Diagnosis Date Noted  . ESRD on dialysis ([place]) 11/09/2016  . Diabetes ([place]) 11/09/2016  . PVD (peripheral vascular disease) ([place]) 11/09/2016  . Cardiac arrest ([place]) 04/28/2016  . Atherosclerosis of extremity with gangrene ([place]) 04/25/2016  . Diabetic polyneuropathy associated with type 2 diabetes mellitus ([place]) 12/09/2015  . Diabetic ulcer of right foot associated with type 2 diabetes mellitus ([place]) 12/09/2015  . Anemia 03/24/2014  . Left foot burn 03/17/2014  . Leukocytosis 03/17/2014  . Obesity 03/17/2014  . PND (paroxysmal nocturnal dyspnea) 06/29/2013  . Volume overload 06/29/2013  . Diastolic congestive heart failure, NYHA class 2 ([place]) 06/28/2013  . Shortness of breath 06/28/2013  . After-cataract with vision obscured 08/01/2011  . Anisometropia 08/01/2011  . Background diabetic retinopathy ([place]) 08/01/2011  . Senile nuclear sclerosis 08/01/2011    Past Surgical History:  Procedure Laterality Date  . A/V FISTULAGRAM Left 01/09/2017   Procedure: A/V Fistulagram;  Surgeon: [name], MD;  Location: [place] [facility];  Service: Cardiovascular;  Laterality: Left;  . A/V FISTULAGRAM Left 06/06/2017   Procedure: A/V Fistulagram;  Surgeon: [name], MD;  Location: [place] [facility];  Service: Cardiovascular;  Laterality: Left;  . A/V FISTULAGRAM Left 08/15/2017   Procedure: A/V Fistulagram;  Surgeon: [name], MD;  Location: [place] [facility];  Service: Cardiovascular;  Laterality: Left;  . A/V SHUNT INTERVENTION N/A 06/06/2017   Procedure: A/V SHUNT INTERVENTION;  Surgeon: [name], MD;  Location: [place] [facility];  Service: Cardiovascular;  Laterality: N/A;  . AMPUTATION Right 04/25/2016   Procedure: TOE AMPUTATION;  Surgeon: Katha Cabal, MD;  Location: [facility];  Service: Vascular;  Laterality: Right;  . APPLICATION OF WOUND VAC Right 04/25/2016   Procedure: APPLICATION OF WOUND VAC;  Surgeon: Katha Cabal, MD;  Location: [facility];  Service: Vascular;  Laterality: Right;  . AV FISTULA PLACEMENT Left 2015  . CARDIAC CATHETERIZATION N/A 04/30/2016   Procedure: Left Heart Cath and Coronary Angiography;  Surgeon: Wellington Hampshire, MD;  Location: [place] [facility];  Service: Cardiovascular;  Laterality: N/A;  . CATARACT EXTRACTION, BILATERAL    . LEG AMPUTATION BELOW KNEE Left   . PERIPHERAL VASCULAR CATHETERIZATION N/A 10/03/2015   Procedure: A/V Shuntogram/Fistulagram;  Surgeon: [name], MD;  Location: [place] [facility];  Service: Cardiovascular;  Laterality: N/A;  . PERIPHERAL VASCULAR CATHETERIZATION N/A 01/16/2016   Procedure: Abdominal Aortogram w/Lower Extremity;  Surgeon: [name], MD;  Location:  [place] [facility];  Service: Cardiovascular;  Laterality: N/A;  . PERIPHERAL VASCULAR CATHETERIZATION  01/16/2016   Procedure: Lower Extremity Intervention;  Surgeon: [name], MD;  Location: [place] [facility];  Service: Cardiovascular;;  . PERIPHERAL VASCULAR CATHETERIZATION Right  04/27/2016   Procedure: Lower Extremity Angiography;  Surgeon: Katha Cabal, MD;  Location: [place] [facility];  Service: Cardiovascular;  Laterality: Right;  . PERIPHERAL VASCULAR CATHETERIZATION  04/27/2016   Procedure: Lower Extremity Intervention;  Surgeon: Katha Cabal, MD;  Location: [place] [facility];  Service: Cardiovascular;;  . TRANSMETATARSAL AMPUTATION Right 04/28/2016   Procedure: TRANSMETATARSAL AMPUTATION;  Surgeon: Albertine Patricia, DPM;  Location: [facility];  Service: Podiatry;  Laterality: Right;  . WOUND DEBRIDEMENT Right 04/25/2016   Procedure: DEBRIDEMENT WOUND;  Surgeon: Katha Cabal, MD;  Location: [facility];  Service: Vascular;  Laterality: Right;    Prior to Admission medications   Medication Sig Start Date End Date Taking? Authorizing Provider  acetaminophen (TYLENOL) 325 MG tablet Take by mouth. 04/02/14   [provider]  amLODipine (NORVASC) 10 MG tablet Take 10 mg by mouth. 07/01/13   [provider]  amoxicillin-clavulanate (AUGMENTIN) 875-125 MG tablet Take 1 tablet by mouth 2 (two) times daily. 12/21/17   Laquentin Loudermilk, Johnette Abraham B, FNP  aspirin EC 81 MG EC tablet Take 1 tablet (81 mg total) by mouth daily. 05/02/16   Gladstone Lighter, MD  atorvastatin (LIPITOR) 20 MG tablet  06/11/16   [provider]  atorvastatin (LIPITOR) 40 MG tablet Take by mouth. 07/01/13   [provider]  b complex-vitamin c-folic acid (NEPHRO-VITE) 0.8 MG TABS tablet Take 1 tablet by mouth daily.    [provider]  bisacodyl (DULCOLAX) 5 MG EC tablet Take 20 mg by mouth daily as needed for moderate constipation.    [provider]  calcium acetate (PHOSLO) 667 MG capsule Take 2,001 mg by mouth 3 (three) times daily with meals. Take 667 mg with snacks    [provider]  calcium carbonate (TUMS) 500 MG chewable tablet Chew by mouth.    [provider]  cinacalcet (SENSIPAR) 60 MG tablet Take 60 mg by mouth 3 (three)  times daily.    [provider]  epoetin alfa (PROCRIT) 91478 UNIT/ML injection Inject into the skin. 04/02/14   [provider]  glipiZIDE (GLUCOTROL) 10 MG tablet Take 10 mg by mouth daily.  10/25/16   [provider]  lidocaine-prilocaine (EMLA) cream Apply 1 application topically as needed (port access).    [provider]  midodrine (PROAMATINE) 10 MG tablet Take 10 mg by mouth daily on Mon, Tues, and Fri at dialysis    [provider]  omeprazole (PRILOSEC) 20 MG capsule Take 20 mg by mouth daily.    [provider]  polyethylene glycol (MIRALAX / GLYCOLAX) packet Take 17 g by mouth daily as needed for mild constipation.    [provider]  sevelamer carbonate (RENVELA) 800 MG tablet Take 800 mg by mouth 3 (three) times daily with meals.    [provider]    Allergies Patient has no known allergies.  Family History  Problem Relation Age of Onset  . Diabetes Father   . Prostate cancer Father     Social History Social History   Tobacco Use  . Smoking status: Never Smoker  . Smokeless tobacco: Never Used  Substance Use Topics  . Alcohol use: No  . Drug use: No  Review of Systems Constitutional: Negative for fever ENT: Positive for dental pain Musculoskeletal: Negative for trismus Skin: Positive for right-sided facial swelling ____________________________________________   PHYSICAL EXAM:  VITAL SIGNS: ED Triage Vitals  Enc Vitals Group     BP 12/21/17 1201 (!) 144/85     Pulse Rate 12/21/17 1201 80     Resp 12/21/17 1201 18     Temp 12/21/17 1201 98.2 F (36.8 C)     Temp Source 12/21/17 1201 Oral     SpO2 12/21/17 1201 100 %     Weight 12/21/17 1202 250 lb (113.4 kg)     Height 12/21/17 1202 5\' 7"  (1.702 m)     Head Circumference --      Peak Flow --      Pain Score --      Pain Loc --      Pain Edu? --      Excl. in [place]? --     Constitutional: Alert and oriented. Well appearing and  in no acute distress. Eyes: No conjunctival discharge or drainage Mouth/Throat: See dental exam. Airway patent and no oropharyngeal edema Periodontal Exam    Hematological/Lymphatic/Immunilogical: No palpable, tender lymphadenopathy. Respiratory: Respirations even and unlabored. Musculoskeletal: No trismus Neurologic: Awake, alert, and oriented x 4.   Skin:  Right side facial swelling. Psychiatric: Affect and behavior are appropriate  ____________________________________________   LABS (all labs ordered are listed, but only abnormal results are displayed)  Labs Reviewed - No data to display ____________________________________________   RADIOLOGY  Not indicated ____________________________________________   PROCEDURES  Procedure(s) performed: None  Critical Care performed: No ____________________________________________   INITIAL IMPRESSION / ASSESSMENT AND PLAN / ED [place] is a 57 y.o. male who presents to the emergency department for treatment and evaluation of increase in facial swelling secondary to dental abscess. The area is draining large amount of purulent fluid.  While here in the emergency department, he was given a milligram of Rocephin IM and the outpatient antibiotic will be switched to Augmentin.  He was encouraged to continue doing the salt water rinse at least 4 times per day.  He has an appointment scheduled for March 19 to have an extraction.  Patient and his wife are encouraged to call to see if they can get an earlier appointment and if not, to make sure that they take the antibiotic as prescribed and until finished.  They were advised to return with him to the emergency department for symptoms that change or worsen if unable to schedule appointment.  Pertinent labs & imaging results that were available during my care of the patient were reviewed by me and considered in my medical decision making (see chart for  details).  ____________________________________________   FINAL CLINICAL IMPRESSION(S) / ED DIAGNOSES  Final diagnoses:  Dental abscess    Discharge Medication List as of 12/21/2017  1:15 PM    START taking these medications   Details  amoxicillin-clavulanate (AUGMENTIN) 875-125 MG tablet Take 1 tablet by mouth 2 (two) times daily., Starting Sat 12/21/2017, Print        If controlled substance prescribed during this visit, 12 month history viewed on the [place] prior to issuing an initial prescription for Schedule II or III opiod.  Note:  This document was prepared using Dragon voice recognition software and may include unintentional dictation errors.    Victorino Dike, FNP 12/21/17 1532    Nena Polio, MD 12/22/17 [phone]

## 2017-12-21 NOTE — ED Notes (Signed)
See triage note   Presents with right sided facial swelling and redness  Was placed on amoxil yesterday    States swelling has gotten worse

## 2017-12-21 NOTE — ED Triage Notes (Signed)
Pt presents to ED via POV c/o dental abscess to R side. Was placed on amoxicillin yesterday but states swelling has gotten worse since yesterday. Denies pain. Has only taken 1 dose of antibiotic.

## 2017-12-25 ENCOUNTER — Ambulatory Visit: Payer: Medicare Other | Admitting: Gastroenterology

## 2018-01-17 ENCOUNTER — Encounter (INDEPENDENT_AMBULATORY_CARE_PROVIDER_SITE_OTHER): Payer: Self-pay

## 2018-01-20 ENCOUNTER — Other Ambulatory Visit (INDEPENDENT_AMBULATORY_CARE_PROVIDER_SITE_OTHER): Payer: Self-pay | Admitting: Vascular Surgery

## 2018-01-21 MED ORDER — CEFAZOLIN SODIUM-DEXTROSE 1-4 GM/50ML-% IV SOLN
1.0000 g | Freq: Once | INTRAVENOUS | Status: AC
  Administered 2018-01-22: 1 g via INTRAVENOUS

## 2018-01-22 ENCOUNTER — Encounter
Admission: RE | Disposition: A | Discharge status: Home / Self Care | Payer: Self-pay | Source: Ambulatory Visit | Attending: Vascular Surgery

## 2018-01-22 ENCOUNTER — Ambulatory Visit
Admission: RE | Admit: 2018-01-22 | Discharge: 2018-01-22 | Disposition: A | Discharge status: Home / Self Care | Payer: Medicare Other | Source: Ambulatory Visit | Attending: Vascular Surgery | Admitting: Vascular Surgery

## 2018-01-22 ENCOUNTER — Encounter: Payer: Self-pay | Admitting: *Deleted

## 2018-01-22 DIAGNOSIS — I10 Essential (primary) hypertension: Secondary | ICD-10-CM

## 2018-01-22 DIAGNOSIS — T82858A Stenosis of vascular prosthetic devices, implants and grafts, initial encounter: Secondary | ICD-10-CM | POA: Insufficient documentation

## 2018-01-22 DIAGNOSIS — I120 Hypertensive chronic kidney disease with stage 5 chronic kidney disease or end stage renal disease: Secondary | ICD-10-CM | POA: Diagnosis not present

## 2018-01-22 DIAGNOSIS — I2510 Atherosclerotic heart disease of native coronary artery without angina pectoris: Secondary | ICD-10-CM | POA: Diagnosis not present

## 2018-01-22 DIAGNOSIS — Z9842 Cataract extraction status, left eye: Secondary | ICD-10-CM | POA: Insufficient documentation

## 2018-01-22 DIAGNOSIS — E1122 Type 2 diabetes mellitus with diabetic chronic kidney disease: Secondary | ICD-10-CM | POA: Diagnosis not present

## 2018-01-22 DIAGNOSIS — Z89512 Acquired absence of left leg below knee: Secondary | ICD-10-CM | POA: Diagnosis not present

## 2018-01-22 DIAGNOSIS — Z89421 Acquired absence of other right toe(s): Secondary | ICD-10-CM | POA: Insufficient documentation

## 2018-01-22 DIAGNOSIS — Z8673 Personal history of transient ischemic attack (TIA), and cerebral infarction without residual deficits: Secondary | ICD-10-CM | POA: Diagnosis not present

## 2018-01-22 DIAGNOSIS — Y832 Surgical operation with anastomosis, bypass or graft as the cause of abnormal reaction of the patient, or of later complication, without mention of misadventure at the time of the procedure: Secondary | ICD-10-CM | POA: Insufficient documentation

## 2018-01-22 DIAGNOSIS — E7800 Pure hypercholesterolemia, unspecified: Secondary | ICD-10-CM | POA: Diagnosis not present

## 2018-01-22 DIAGNOSIS — Z955 Presence of coronary angioplasty implant and graft: Secondary | ICD-10-CM | POA: Insufficient documentation

## 2018-01-22 DIAGNOSIS — Z992 Dependence on renal dialysis: Secondary | ICD-10-CM | POA: Diagnosis not present

## 2018-01-22 DIAGNOSIS — T82868A Thrombosis of vascular prosthetic devices, implants and grafts, initial encounter: Secondary | ICD-10-CM

## 2018-01-22 DIAGNOSIS — Z9841 Cataract extraction status, right eye: Secondary | ICD-10-CM | POA: Diagnosis not present

## 2018-01-22 DIAGNOSIS — N186 End stage renal disease: Secondary | ICD-10-CM | POA: Diagnosis not present

## 2018-01-22 DIAGNOSIS — Z9889 Other specified postprocedural states: Secondary | ICD-10-CM | POA: Diagnosis not present

## 2018-01-22 DIAGNOSIS — E119 Type 2 diabetes mellitus without complications: Secondary | ICD-10-CM | POA: Diagnosis not present

## 2018-01-22 DIAGNOSIS — Z833 Family history of diabetes mellitus: Secondary | ICD-10-CM | POA: Insufficient documentation

## 2018-01-22 HISTORY — PX: A/V FISTULAGRAM: CATH118298

## 2018-01-22 LAB — POTASSIUM (ARMC VASCULAR LAB ONLY): POTASSIUM (ARMC VASCULAR LAB): 4.5 (ref 3.5–5.1)

## 2018-01-22 LAB — GLUCOSE, CAPILLARY: Glucose-Capillary: 127 mg/dL — ABNORMAL HIGH (ref 65–99)

## 2018-01-22 SURGERY — A/V FISTULAGRAM
Anesthesia: Moderate Sedation | Laterality: Left

## 2018-01-22 MED ORDER — IOPAMIDOL (ISOVUE-300) INJECTION 61%
INTRAVENOUS | Status: DC | PRN
  Administered 2018-01-22: 30 mL via INTRA_ARTERIAL

## 2018-01-22 MED ORDER — HEPARIN SODIUM (PORCINE) 5000 UNIT/ML IJ SOLN
INTRAMUSCULAR | Status: DC | PRN
  Administered 2018-01-22: 3000 [IU] via INTRAVENOUS

## 2018-01-22 MED ORDER — LIDOCAINE-EPINEPHRINE (PF) 1 %-1:200000 IJ SOLN
INTRAMUSCULAR | Status: AC
  Filled 2018-01-22: qty 30

## 2018-01-22 MED ORDER — METHYLPREDNISOLONE SODIUM SUCC 125 MG IJ SOLR: 125.0000 mg | INTRAMUSCULAR | Status: DC | PRN

## 2018-01-22 MED ORDER — MIDAZOLAM HCL 2 MG/2ML IJ SOLN
INTRAMUSCULAR | Status: DC | PRN
  Administered 2018-01-22: 2 mg via INTRAVENOUS

## 2018-01-22 MED ORDER — ONDANSETRON HCL 4 MG/2ML IJ SOLN
4.0000 mg | Freq: Four times a day (QID) | INTRAMUSCULAR | Status: DC | PRN
Start: 2018-01-22 — End: 2018-01-22

## 2018-01-22 MED ORDER — SODIUM CHLORIDE 0.9 % IV SOLN: INTRAVENOUS | Status: DC

## 2018-01-22 MED ORDER — FAMOTIDINE 20 MG PO TABS: 40.0000 mg | ORAL_TABLET | ORAL | Status: DC | PRN

## 2018-01-22 MED ORDER — HEPARIN SODIUM (PORCINE) 1000 UNIT/ML IJ SOLN
INTRAMUSCULAR | Status: AC
  Filled 2018-01-22: qty 1

## 2018-01-22 MED ORDER — MIDAZOLAM HCL 5 MG/5ML IJ SOLN
INTRAMUSCULAR | Status: AC
  Filled 2018-01-22: qty 5

## 2018-01-22 MED ORDER — FENTANYL CITRATE (PF) 100 MCG/2ML IJ SOLN
INTRAMUSCULAR | Status: AC
  Filled 2018-01-22: qty 2

## 2018-01-22 MED ORDER — HEPARIN (PORCINE) IN NACL 2-0.9 UNIT/ML-% IJ SOLN
INTRAMUSCULAR | Status: AC
  Filled 2018-01-22: qty 1000

## 2018-01-22 MED ORDER — FENTANYL CITRATE (PF) 100 MCG/2ML IJ SOLN
INTRAMUSCULAR | Status: DC | PRN
  Administered 2018-01-22: 50 ug via INTRAVENOUS

## 2018-01-22 MED ORDER — HYDROMORPHONE HCL 1 MG/ML IJ SOLN: 1.0000 mg | Freq: Once | INTRAMUSCULAR | Status: DC | PRN

## 2018-01-22 SURGICAL SUPPLY — 17 items
BALLN DORADO 5X60X80 (BALLOONS) ×3
BALLN LUTONIX DCB 5X60X130 (BALLOONS) ×3
BALLOON DORADO 5X60X80 (BALLOONS) ×1 IMPLANT
BALLOON LUTONIX DCB 5X60X130 (BALLOONS) ×1 IMPLANT
CANNULA 5F STIFF (CANNULA) ×3 IMPLANT
CATH BEACON 5 .035 40 KMP TP (CATHETERS) ×1 IMPLANT
CATH BEACON 5 .038 40 KMP TP (CATHETERS) ×2
COVER PROBE U/S 5X48 (MISCELLANEOUS) ×3 IMPLANT
DEVICE PRESTO INFLATION (MISCELLANEOUS) ×3 IMPLANT
DRAPE BRACHIAL (DRAPES) ×3 IMPLANT
PACK ANGIOGRAPHY (CUSTOM PROCEDURE TRAY) ×3 IMPLANT
SHEATH BRITE TIP 6FRX5.5 (SHEATH) ×3 IMPLANT
SUT MNCRL 4-0 (SUTURE) ×2
SUT MNCRL 4-0 27XMFL (SUTURE) ×1
SUTURE MNCRL 4-0 27XMF (SUTURE) ×1 IMPLANT
TOWEL OR 17X26 4PK STRL BLUE (TOWEL DISPOSABLE) ×6 IMPLANT
WIRE MAGIC TOR.035 180C (WIRE) ×3 IMPLANT

## 2018-01-22 NOTE — H&P (Signed)
[facility] History & Physical Update  The patient was interviewed and re-examined.  The patient's previous History and Physical has been reviewed and is unchanged.  There is no change in the plan of care. We plan to proceed with the scheduled procedure.  Leotis Pain, MD  01/22/2018, 8:30 AM

## 2018-01-22 NOTE — H&P (Signed)
[facility] Admission History & Physical  MRN : [id number]  [name] is a 57 y.o. (1961-09-07) male who presents with chief complaint of No chief complaint on file. Marland Kitchen  History of Present Illness: I am asked to evaluate the patient by the dialysis center. The patient was sent here because they were unable to achieve adequate dialysis this morning. Furthermore the Center states there is very poor thrill and bruit. The patient states there there have been increasing problems with the access, such as "pulling clots" during dialysis and prolonged bleeding after decannulation. The patient estimates these problems have been going on for several weeks. The patient is unaware of any other change.  Patient denies pain or tenderness overlying the access.  There is no pain with dialysis.  The patient denies hand pain or finger pain consistent with steal syndrome.   There have been past interventions or declots of this access.  The patient is not chronically hypotensive on dialysis.  Current Facility-Administered Medications  Medication Dose Route Frequency Provider Last Rate Last Dose  . 0.9 %  sodium chloride infusion   Intravenous Continuous Stegmayer, Kimberly A, PA-C      . ceFAZolin (ANCEF) IVPB 1 g/50 mL premix  1 g Intravenous Once Stegmayer, Kimberly A, PA-C      . famotidine (PEPCID) tablet 40 mg  40 mg Oral PRN Stegmayer, Janalyn Harder, PA-C      . HYDROmorphone (DILAUDID) injection 1 mg  1 mg Intravenous Once PRN Stegmayer, Kimberly A, PA-C      . methylPREDNISolone sodium succinate (SOLU-MEDROL) 125 mg/2 mL injection 125 mg  125 mg Intravenous PRN Stegmayer, Kimberly A, PA-C      . ondansetron (ZOFRAN) injection 4 mg  4 mg Intravenous Q6H PRN Stegmayer, Janalyn Harder, PA-C        Past Medical History:  Diagnosis Date  . Anemia   . Atherosclerosis   . Chronic kidney disease   . Diabetes mellitus without complication ([place])   . Hypercholesteremia   . Hypertension    . Hypotension   . Stroke [facility])     Past Surgical History:  Procedure Laterality Date  . A/V FISTULAGRAM Left 01/09/2017   Procedure: A/V Fistulagram;  Surgeon: [name], MD;  Location: [place] [facility];  Service: Cardiovascular;  Laterality: Left;  . A/V FISTULAGRAM Left 06/06/2017   Procedure: A/V Fistulagram;  Surgeon: [name], MD;  Location: [place] [facility];  Service: Cardiovascular;  Laterality: Left;  . A/V FISTULAGRAM Left 08/15/2017   Procedure: A/V Fistulagram;  Surgeon: [name], MD;  Location: [place] [facility];  Service: Cardiovascular;  Laterality: Left;  . A/V SHUNT INTERVENTION N/A 06/06/2017   Procedure: A/V SHUNT INTERVENTION;  Surgeon: [name], MD;  Location: [place] [facility];  Service: Cardiovascular;  Laterality: N/A;  . AMPUTATION Right 04/25/2016   Procedure: TOE AMPUTATION;  Surgeon: Katha Cabal, MD;  Location: [facility];  Service: Vascular;  Laterality: Right;  . APPLICATION OF WOUND VAC Right 04/25/2016   Procedure: APPLICATION OF WOUND VAC;  Surgeon: Katha Cabal, MD;  Location: [facility];  Service: Vascular;  Laterality: Right;  . AV FISTULA PLACEMENT Left 2015  . CARDIAC CATHETERIZATION N/A 04/30/2016   Procedure: Left Heart Cath and Coronary Angiography;  Surgeon: Wellington Hampshire, MD;  Location: [place] [facility];  Service: Cardiovascular;  Laterality: N/A;  . CATARACT EXTRACTION, BILATERAL    . LEG AMPUTATION BELOW KNEE  Left   . PERIPHERAL VASCULAR CATHETERIZATION N/A 10/03/2015   Procedure: A/V Shuntogram/Fistulagram;  Surgeon: [name], MD;  Location: [place] [facility];  Service: Cardiovascular;  Laterality: N/A;  . PERIPHERAL VASCULAR CATHETERIZATION N/A 01/16/2016   Procedure: Abdominal Aortogram w/Lower Extremity;  Surgeon: [name], MD;  Location: [place] [facility];  Service: Cardiovascular;  Laterality: N/A;  . PERIPHERAL VASCULAR CATHETERIZATION  01/16/2016   Procedure: Lower Extremity Intervention;   Surgeon: [name], MD;  Location: [place] [facility];  Service: Cardiovascular;;  . PERIPHERAL VASCULAR CATHETERIZATION Right 04/27/2016   Procedure: Lower Extremity Angiography;  Surgeon: Katha Cabal, MD;  Location: [place] [facility];  Service: Cardiovascular;  Laterality: Right;  . PERIPHERAL VASCULAR CATHETERIZATION  04/27/2016   Procedure: Lower Extremity Intervention;  Surgeon: Katha Cabal, MD;  Location: [place] [facility];  Service: Cardiovascular;;  . TRANSMETATARSAL AMPUTATION Right 04/28/2016   Procedure: TRANSMETATARSAL AMPUTATION;  Surgeon: Albertine Patricia, DPM;  Location: [facility];  Service: Podiatry;  Laterality: Right;  . WOUND DEBRIDEMENT Right 04/25/2016   Procedure: DEBRIDEMENT WOUND;  Surgeon: Katha Cabal, MD;  Location: [facility];  Service: Vascular;  Laterality: Right;    Social History Social History   Tobacco Use  . Smoking status: Never Smoker  . Smokeless tobacco: Never Used  Substance Use Topics  . Alcohol use: No  . Drug use: No    Family History Family History  Problem Relation Age of Onset  . Diabetes Father   . Prostate cancer Father     No family history of bleeding or clotting disorders, autoimmune disease or porphyria  No Known Allergies   REVIEW OF SYSTEMS (Negative unless checked)  Constitutional: [] Weight loss  [] Fever  [] Chills Cardiac: [] Chest pain   [] Chest pressure   [] Palpitations   [] Shortness of breath when laying flat   [] Shortness of breath at rest   [x] Shortness of breath with exertion. Vascular:  [] Pain in legs with walking   [] Pain in legs at rest   [] Pain in legs when laying flat   [] Claudication   [] Pain in feet when walking  [] Pain in feet at rest  [] Pain in feet when laying flat   [] History of DVT   [] Phlebitis   [] Swelling in legs   [] Varicose veins   [] Non-healing ulcers Pulmonary:   [] Uses home oxygen   [] Productive cough   [] Hemoptysis   [] Wheeze  [] COPD   [] Asthma Neurologic:  [] Dizziness   [] Blackouts   [] Seizures   [x] History of stroke   [] History of TIA  [] Aphasia   [] Temporary blindness   [] Dysphagia   [] Weakness or numbness in arms   [] Weakness or numbness in legs Musculoskeletal:  [x] Arthritis   [] Joint swelling   [] Joint pain   [] Low back pain Hematologic:  [] Easy bruising  [] Easy bleeding   [] Hypercoagulable state   [] Anemic  [] Hepatitis Gastrointestinal:  [] Blood in stool   [] Vomiting blood  [] Gastroesophageal reflux/heartburn   [] Difficulty swallowing. Genitourinary:  [x] Chronic kidney disease   [] Difficult urination  [] Frequent urination  [] Burning with urination   [] Blood in urine Skin:  [] Rashes   [x] Ulcers   [x] Wounds Psychological:  [] History of anxiety   []  History of major depression.  Physical Examination  There were no vitals filed for this visit. There is no height or weight on file to calculate BMI. Gen: WD/WN, NAD Head: [place]/AT, No temporalis wasting.  Ear/Nose/Throat: Hearing grossly intact, nares w/o erythema or drainage, oropharynx w/o Erythema/Exudate,  Eyes: Conjunctiva clear,  sclera non-icteric Neck: Trachea midline.  No JVD.  Pulmonary:  Good air movement, respirations not labored, no use of accessory muscles.  Cardiac: irregular Vascular: soft thrill in left arm AVF Vessel Right Left  Radial Palpable Palpable               Musculoskeletal: contractures present, limited mobility Neurologic: Sensation grossly intact in extremities.  Symmetrical.  Speech is fluent. Motor exam as listed above. Psychiatric: Judgment intact, Mood & affect appropriate for pt's clinical situation. Dermatologic: No rashes or ulcers noted.  No cellulitis or open wounds.    CBC Lab Results  Component Value Date   WBC 9.6 05/01/2016   HGB 7.5 (L) 05/01/2016   HCT 23.5 (L) 05/01/2016   MCV 87.8 05/01/2016   PLT 244 05/01/2016    BMET    Component Value Date/Time   NA 138 05/01/2016 0500   K 4.5 05/01/2016 0500   K 6.6 (HH) 03/10/2013 1351   CL 98 (L)  05/01/2016 0500   CO2 30 05/01/2016 0500   GLUCOSE 105 (H) 05/01/2016 0500   BUN 40 (H) 05/01/2016 0500   CREATININE 6.26 (H) 05/01/2016 0500   CALCIUM 8.3 (L) 05/01/2016 0500   GFRNONAA 9 (L) 05/01/2016 0500   GFRAA 10 (L) 05/01/2016 0500   CrCl cannot be calculated (Patient's most recent lab result is older than the maximum 21 days allowed.).  COAG Lab Results  Component Value Date   INR 1.47 04/30/2016   INR 1.31 04/23/2016    Radiology No results found.  Assessment/Plan 1.  Complication dialysis device with dysfunction of AV access:  Patient's left arm dialysis access is malfunctioning. The patient will undergo angiography and correction of any problems using interventional techniques with the hope of restoring function to the access.  The risks and benefits were described to the patient.  All questions were answered.  The patient agrees to proceed with angiography and intervention. Potassium will be drawn to ensure that it is an appropriate level prior to performing intervention. 2.  End-stage renal disease requiring hemodialysis:  Patient will continue dialysis therapy without further interruption if a successful intervention is not achieved then a tunneled catheter will be placed. Dialysis has already been arranged. 3.  Hypertension:  Patient will continue medical management; nephrology is following no changes in oral medications. 4. Diabetes mellitus:  Glucose will be monitored and oral medications been held this morning once the patient has undergone the patient's procedure po intake will be reinitiated and again Accu-Cheks will be used to assess the blood glucose level and treat as needed. The patient will be restarted on the patient's usual hypoglycemic regime     Leotis Pain, MD  01/22/2018 9:02 AM

## 2018-01-22 NOTE — Op Note (Signed)
[facility]    OPERATIVE NOTE   PROCEDURE: 1.   Left radiocephalic arteriovenous fistula cannulation under ultrasound guidance 2.   Left arm fistulagram including central venogram 3.   Percutaneous transluminal angioplasty of the perianastomotic left cephalic vein in the distal forearm with 5 mm diameter Lutonix drug-coated and 5 mm diameter high-pressure angioplasty balloon  PRE-OPERATIVE DIAGNOSIS: 1. ESRD 2. Poorly functional left radiocephalic AVF  POST-OPERATIVE DIAGNOSIS: same as above   SURGEON: Leotis Pain, MD  ANESTHESIA: local with MCS  ESTIMATED BLOOD LOSS: 3 cc  FINDING(S): 1. 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  The remainder of the forearm cephalic vein was widely patent without stenosis.  The upper arm outflow was dominant through the basilic vein in the central venous circulation was widely patent.  SPECIMEN(S):  None  CONTRAST: 30 cc  FLUORO TIME: 3.2 minutes  MODERATE CONSCIOUS SEDATION TIME: Approximately 25 minutes with 2 mg of Versed and 50 Mcg of Fentanyl   INDICATIONS: [name] is a 57 y.o. male who presents with malfunctioning left radiocephalic arteriovenous fistula.  The patient is scheduled for left arm fistulagram.  The patient is aware the risks include but are not limited to: bleeding, infection, thrombosis of the cannulated access, and possible anaphylactic reaction to the contrast.  The patient is aware of the risks of the procedure and elects to proceed forward.  DESCRIPTION: After full informed written consent was obtained, the patient was brought back to the angiography suite and placed supine upon the angiography table.  The patient was connected to monitoring equipment. Moderate conscious sedation was administered with a face to face encounter with the patient throughout the procedure with my supervision of the RN  administering medicines and monitoring the patient's vital signs and mental status throughout from the start of the procedure until the patient was taken to the recovery room. The left arm was prepped and draped in the standard fashion for a percutaneous access intervention.  Under ultrasound guidance, the left radiocephalic arteriovenous fistula was cannulated with a micropuncture needle under direct ultrasound guidance in a retrograde fashion near the antecubital fossa and a permanent image was performed.  The microwire was advanced into the fistula and the needle was exchanged for the a microsheath.  I then upsized to a 6 Fr Sheath.  A Magic torque wire and a Kumpe catheter were then used to cross down to the anastomosis and the wire was removed.  Through the Kumpe catheter, imaging was performed.  Hand injections were completed to image the access including the central venous system. This demonstrated high-grade, greater than 90% stenosis of the cephalic vein just beyond the anastomosis to the radial artery in the distal forearm.  The remainder of the forearm cephalic vein was widely patent without stenosis.  The upper arm outflow was dominant through the basilic vein in the central venous circulation was widely patent.  Based on the images, this patient will need intervention to this perianastomotic cephalic vein stenosis. I then gave the patient 3000 units of intravenous heparin.  I then crossed the stenosis with a Magic Tourqe wire.  Based on the imaging, a 5 mm x 6 cm Lutonix drug-coated angioplasty balloon was selected.  The balloon was centered around the forearm perianastomotic cephalic vein stenosis and inflated to 14 ATM for 1 minute(s).  This was burst inflation but the waist did not break.  I  then used a high pressure angioplasty balloon and inflated in the same location up to 40 atm for 1 minute.  On completion imaging, a 35-40 % residual stenosis was present.     Based on the completion  imaging, no further intervention is necessary.  The wire and balloon were removed from the sheath.  A 4-0 Monocryl purse-string suture was sewn around the sheath.  The sheath was removed while tying down the suture.  A sterile bandage was applied to the puncture site.  COMPLICATIONS: None  CONDITION: Stable   Leotis Pain  01/22/2018 10:29 AM   This note was created with Dragon Medical transcription system. Any errors in dictation are purely unintentional.

## 2018-02-20 ENCOUNTER — Ambulatory Visit (INDEPENDENT_AMBULATORY_CARE_PROVIDER_SITE_OTHER): Payer: Medicare Other | Admitting: Vascular Surgery

## 2018-02-20 ENCOUNTER — Encounter (INDEPENDENT_AMBULATORY_CARE_PROVIDER_SITE_OTHER): Payer: Self-pay | Admitting: Vascular Surgery

## 2018-02-20 ENCOUNTER — Ambulatory Visit (INDEPENDENT_AMBULATORY_CARE_PROVIDER_SITE_OTHER): Payer: Medicare Other

## 2018-02-20 VITALS — BP 124/63 | HR 92 | Resp 16 | Ht 67.0 in | Wt 258.0 lb

## 2018-02-20 DIAGNOSIS — Z992 Dependence on renal dialysis: Secondary | ICD-10-CM | POA: Diagnosis not present

## 2018-02-20 DIAGNOSIS — I739 Peripheral vascular disease, unspecified: Secondary | ICD-10-CM

## 2018-02-20 DIAGNOSIS — N186 End stage renal disease: Secondary | ICD-10-CM

## 2018-02-20 NOTE — Progress Notes (Signed)
Subjective:    Patient ID: [name], male    DOB: 06-03-1961, 57 y.o.   MRN: [id number] Chief Complaint  Patient presents with  . Follow-up    3wk HDA   Patient presents for his first post procedure follow-up.  The patient is status post a left upper extremity fistulogram with intervention on January 22, 2018.  The patient notes an improvement in the function of his left radiocephalic AV fistula.  The patient underwent a left upper extremity dialysis access duplex which is notable for a flow volume of 1544.  Patent arteriovenous fistula.  The patient denies any uremic symptoms.  The patient denies any issues with the fistula skin site.  The patient denies any fever, nausea or vomiting.  Review of Systems  Constitutional: Negative.   HENT: Negative.   Eyes: Negative.   Respiratory: Negative.   Cardiovascular: Negative.   Gastrointestinal: Negative.   Endocrine: Negative.   Genitourinary:       ESRD  Musculoskeletal: Negative.   Skin: Negative.   Allergic/Immunologic: Negative.   Neurological: Negative.   Hematological: Negative.   Psychiatric/Behavioral: Negative.       Objective:   Physical Exam  Constitutional: He is oriented to person, place, and time. He appears well-developed and well-nourished. No distress.  HENT:  Head: Normocephalic and atraumatic.  Right Ear: External ear normal.  Left Ear: External ear normal.  Eyes: Pupils are equal, round, and reactive to light. Conjunctivae and EOM are normal.  Neck: Normal range of motion.  Cardiovascular: Normal rate, regular rhythm, normal heart sounds and intact distal pulses.  Pulses:      Radial pulses are 2+ on the right side, and 2+ on the left side.  Left radiocephalic AV fistula: And is intact.  Good bruit and thrill.  Pulmonary/Chest: Effort normal and breath sounds normal.  Musculoskeletal: Normal range of motion. He exhibits no edema.  Neurological: He is alert and oriented to person, place, and time.  Skin:  Skin is warm and dry. He is not diaphoretic.  Psychiatric: He has a normal mood and affect. His behavior is normal. Judgment and thought content normal.  Vitals reviewed.  BP 124/63 (BP Location: Right Arm)   Pulse 92   Resp 16   Ht 5\' 7"  (1.702 m)   Wt 258 lb (117 kg)   BMI 40.41 kg/m   Past Medical History:  Diagnosis Date  . Anemia   . Atherosclerosis   . Chronic kidney disease   . Diabetes mellitus without complication ([place])   . Hypercholesteremia   . Hypertension   . Hypotension   . Stroke [facility])    Social History   Socioeconomic History  . Marital status: Married    Spouse name: Not on file  . Number of children: Not on file  . Years of education: Not on file  . Highest education level: Not on file  Occupational History  . Not on file  Social Needs  . Financial resource strain: Not on file  . Food insecurity:    Worry: Not on file    Inability: Not on file  . Transportation needs:    Medical: Not on file    Non-medical: Not on file  Tobacco Use  . Smoking status: Never Smoker  . Smokeless tobacco: Never Used  Substance and Sexual Activity  . Alcohol use: No  . Drug use: No  . Sexual activity: Not on file  Lifestyle  . Physical activity:    Days per  week: Not on file    Minutes per session: Not on file  . Stress: Not on file  Relationships  . Social connections:    Talks on phone: Not on file    Gets together: Not on file    Attends religious service: Not on file    Active member of club or organization: Not on file    Attends meetings of clubs or organizations: Not on file    Relationship status: Not on file  . Intimate partner violence:    Fear of current or ex partner: Not on file    Emotionally abused: Not on file    Physically abused: Not on file    Forced sexual activity: Not on file  Other Topics Concern  . Not on file  Social History Narrative  . Not on file   Past Surgical History:  Procedure Laterality Date  . A/V FISTULAGRAM Left  01/09/2017   Procedure: A/V Fistulagram;  Surgeon: [name], MD;  Location: [place] [facility];  Service: Cardiovascular;  Laterality: Left;  . A/V FISTULAGRAM Left 06/06/2017   Procedure: A/V Fistulagram;  Surgeon: [name], MD;  Location: [place] [facility];  Service: Cardiovascular;  Laterality: Left;  . A/V FISTULAGRAM Left 08/15/2017   Procedure: A/V Fistulagram;  Surgeon: [name], MD;  Location: [place] [facility];  Service: Cardiovascular;  Laterality: Left;  . A/V FISTULAGRAM Left 01/22/2018   Procedure: A/V FISTULAGRAM;  Surgeon: [name], MD;  Location: [place] [facility];  Service: Cardiovascular;  Laterality: Left;  . A/V SHUNT INTERVENTION N/A 06/06/2017   Procedure: A/V SHUNT INTERVENTION;  Surgeon: [name], MD;  Location: [place] [facility];  Service: Cardiovascular;  Laterality: N/A;  . AMPUTATION Right 04/25/2016   Procedure: TOE AMPUTATION;  Surgeon: Katha Cabal, MD;  Location: [facility];  Service: Vascular;  Laterality: Right;  . APPLICATION OF WOUND VAC Right 04/25/2016   Procedure: APPLICATION OF WOUND VAC;  Surgeon: Katha Cabal, MD;  Location: [facility];  Service: Vascular;  Laterality: Right;  . AV FISTULA PLACEMENT Left 2015  . CARDIAC CATHETERIZATION N/A 04/30/2016   Procedure: Left Heart Cath and Coronary Angiography;  Surgeon: Wellington Hampshire, MD;  Location: [place] [facility];  Service: Cardiovascular;  Laterality: N/A;  . CATARACT EXTRACTION, BILATERAL    . LEG AMPUTATION BELOW KNEE Left   . PERIPHERAL VASCULAR CATHETERIZATION N/A 10/03/2015   Procedure: A/V Shuntogram/Fistulagram;  Surgeon: [name], MD;  Location: [place] [facility];  Service: Cardiovascular;  Laterality: N/A;  . PERIPHERAL VASCULAR CATHETERIZATION N/A 01/16/2016   Procedure: Abdominal Aortogram w/Lower Extremity;  Surgeon: [name], MD;  Location: [place] [facility];  Service: Cardiovascular;  Laterality: N/A;  . PERIPHERAL VASCULAR CATHETERIZATION   01/16/2016   Procedure: Lower Extremity Intervention;  Surgeon: [name], MD;  Location: [place] [facility];  Service: Cardiovascular;;  . PERIPHERAL VASCULAR CATHETERIZATION Right 04/27/2016   Procedure: Lower Extremity Angiography;  Surgeon: Katha Cabal, MD;  Location: [place] [facility];  Service: Cardiovascular;  Laterality: Right;  . PERIPHERAL VASCULAR CATHETERIZATION  04/27/2016   Procedure: Lower Extremity Intervention;  Surgeon: Katha Cabal, MD;  Location: [place] [facility];  Service: Cardiovascular;;  . TRANSMETATARSAL AMPUTATION Right 04/28/2016   Procedure: TRANSMETATARSAL AMPUTATION;  Surgeon: Albertine Patricia, DPM;  Location: [facility];  Service: Podiatry;  Laterality: Right;  . WOUND DEBRIDEMENT Right 04/25/2016   Procedure: DEBRIDEMENT WOUND;  Surgeon: Katha Cabal, MD;  Location: [facility];  Service: Vascular;  Laterality: Right;   Family History  Problem Relation Age of Onset  . Diabetes Father   . Prostate cancer Father    No Known Allergies     Assessment & Plan:  Patient presents for his first post procedure follow-up.  The patient is status post a left upper extremity fistulogram with intervention on January 22, 2018.  The patient notes an improvement in the function of his left radiocephalic AV fistula.  The patient underwent a left upper extremity dialysis access duplex which is notable for a flow volume of 1544.  Patent arteriovenous fistula.  The patient denies any uremic symptoms.  The patient denies any issues with the fistula skin site.  The patient denies any fever, nausea or vomiting.  1. ESRD on dialysis ([place]) - Stable Studies reviewed with patient. The patient is doing well and currently has adequate dialysis access. Duplex ultrasound of the AV access shows a patent access with no evidence of hemodynamically significant strictures or stenosis.  The patient should continue to have duplex ultrasounds of the dialysis access every six  months. The patient was instructed to call the office in the interim if any issues with dialysis access / doppler flow, pain, edema, pallor, fistula skin breakdown or ulceration of the arm / hand occur. The patient expressed their understanding  - VAS US DUPLEX DIALYSIS ACCESS (AVF,AVG); Future  2. PVD (peripheral vascular disease) ([place]) - Stable The patient presents asymptomatically. This is monitored on a regular basis  Current Outpatient Medications on File Prior to Visit  Medication Sig Dispense Refill  . acetaminophen (TYLENOL) 325 MG tablet Take by mouth.    Marland Kitchen amLODipine (NORVASC) 10 MG tablet Take 10 mg by mouth.    Marland Kitchen aspirin EC 81 MG EC tablet Take 1 tablet (81 mg total) by mouth daily. 30 tablet 0  . atorvastatin (LIPITOR) 20 MG tablet     . atorvastatin (LIPITOR) 40 MG tablet Take by mouth.    Marland Kitchen b complex-vitamin c-folic acid (NEPHRO-VITE) 0.8 MG TABS tablet Take 1 tablet by mouth daily.    . bisacodyl (DULCOLAX) 5 MG EC tablet Take 20 mg by mouth daily as needed for moderate constipation.    . calcium acetate (PHOSLO) 667 MG capsule Take 2,001 mg by mouth 3 (three) times daily with meals. Take 667 mg with snacks    . calcium carbonate (TUMS) 500 MG chewable tablet Chew by mouth.    . cinacalcet (SENSIPAR) 60 MG tablet Take 60 mg by mouth 3 (three) times daily.    Marland Kitchen epoetin alfa (PROCRIT) 89381 UNIT/ML injection Inject into the skin.    Marland Kitchen glipiZIDE (GLUCOTROL) 10 MG tablet Take 10 mg by mouth daily.     Marland Kitchen lidocaine-prilocaine (EMLA) cream Apply 1 application topically as needed (port access).    . midodrine (PROAMATINE) 10 MG tablet Take 10 mg by mouth daily on Mon, Tues, and Fri at dialysis    . omeprazole (PRILOSEC) 20 MG capsule Take 20 mg by mouth daily.    . polyethylene glycol (MIRALAX / GLYCOLAX) packet Take 17 g by mouth daily as needed for mild constipation.    . sevelamer carbonate (RENVELA) 800 MG tablet Take 800 mg by mouth 3 (three) times daily with meals.    Marland Kitchen  amoxicillin-clavulanate (AUGMENTIN) 875-125 MG tablet Take 1 tablet by mouth 2 (two) times daily. (Patient not taking: Reported on 01/22/2018) 20 tablet 0   No  current facility-administered medications on file prior to visit.    There are no Patient Instructions on file for this visit. No follow-ups on file.  English Tomer A Margart Zemanek, PA-C

## 2018-07-03 ENCOUNTER — Encounter (INDEPENDENT_AMBULATORY_CARE_PROVIDER_SITE_OTHER): Payer: Self-pay | Admitting: Nurse Practitioner

## 2018-07-03 ENCOUNTER — Encounter (INDEPENDENT_AMBULATORY_CARE_PROVIDER_SITE_OTHER): Payer: Self-pay

## 2018-07-03 ENCOUNTER — Ambulatory Visit (INDEPENDENT_AMBULATORY_CARE_PROVIDER_SITE_OTHER): Payer: Medicare Other | Admitting: Nurse Practitioner

## 2018-07-03 VITALS — BP 145/76 | HR 87 | Resp 15 | Ht 67.0 in | Wt 257.0 lb

## 2018-07-03 DIAGNOSIS — E1142 Type 2 diabetes mellitus with diabetic polyneuropathy: Secondary | ICD-10-CM

## 2018-07-03 DIAGNOSIS — Z992 Dependence on renal dialysis: Secondary | ICD-10-CM

## 2018-07-03 DIAGNOSIS — N186 End stage renal disease: Secondary | ICD-10-CM | POA: Diagnosis not present

## 2018-07-03 DIAGNOSIS — I739 Peripheral vascular disease, unspecified: Secondary | ICD-10-CM | POA: Diagnosis not present

## 2018-07-03 NOTE — Progress Notes (Signed)
Subjective:    Patient ID: [name], male    DOB: 06/14/1961, 57 y.o.   MRN: [id number] Chief Complaint  Patient presents with  . Follow-up    ref troxler for angio     HPI  [name] is a 57 y.o. male that is referred to [place] today by Dr. Elvina Mattes due to concerns from a small shallow ulceration on the right lower extremity on the medial portion of the transmetatarsal amputation.  The patient stated that this developed from a rubbing irritation.  The patient has a history of prior amputation as well as end-stage renal disease.  Patient also has a history of multiple prior interventions.  The patient denies any claudication-like symptoms when ambulating or rest pain.  The patient denies any chest pain or shortness of breath.  The patient denies any fever chills, nausea vomiting diarrhea.  The patient denies any amaurosis fugax or TIA-like symptoms.  Review of Systems: Negative Unless Checked Constitutional: [] Weight loss  [] Fever  [] Chills Cardiac: [] Chest pain   [] Chest pressure   [] Palpitations   [] Shortness of breath when laying flat   [] Shortness of breath with exertion. Vascular:  [] Pain in legs with walking   [] Pain in legs with standing  [] History of DVT   [] Phlebitis   [] Swelling in legs   [] Varicose veins   [x] Non-healing ulcers Pulmonary:   [] Uses home oxygen   [] Productive cough   [] Hemoptysis   [] Wheeze  [] COPD   [] Asthma Neurologic:  [] Dizziness   [] Seizures   [] History of stroke   [] History of TIA  [] Aphasia   [] Vissual changes   [] Weakness or numbness in arm   [x] Weakness or numbness in leg Musculoskeletal:   [] Joint swelling   [] Joint pain   [] Low back pain Hematologic:  [] Easy bruising  [] Easy bleeding   [] Hypercoagulable state   [] Anemic Gastrointestinal:  [] Diarrhea   [] Vomiting  [] Gastroesophageal reflux/heartburn   [] Difficulty swallowing. Genitourinary:  [x] Chronic kidney disease   [] Difficult urination  [] Frequent urination   [] Blood in urine Skin:  [] Rashes    [x] Ulcers  Psychological:  [] History of anxiety   []  History of major depression.     Objective:   Physical Exam  BP (!) 145/76 (BP Location: Right Arm)   Pulse 87   Resp 15   Ht 5\' 7"  (1.702 m)   Wt 257 lb (116.6 kg)   BMI 40.25 kg/m   Past Medical History:  Diagnosis Date  . Anemia   . Atherosclerosis   . Chronic kidney disease   . Diabetes mellitus without complication ([place])   . Hypercholesteremia   . Hypertension   . Hypotension   . Stroke [facility])      Gen: WD/WN, NAD Head: [place]/AT, No temporalis wasting.  Ear/Nose/Throat: Hearing grossly intact, nares w/o erythema or drainage poor dentition Eyes: PER, EOMI, sclera nonicteric.  Neck: Supple, no masses.  No JVD.  Pulmonary:  Good air movement, no use of accessory muscles.  Cardiac: RRR Vascular:  Left below the knee amputation, transmetatarsal amputation on the right lower extremity.  Small shallow ulcer on medial surface of transmetatarsal amputation.  No evidence of cellulitis or infection. Vessel Right Left  PT  nonpalpable   DP  nonpalpable   Gastrointestinal: soft, non-distended. No guarding/no peritoneal signs.  Musculoskeletal: M/S 5/5 throughout.  No deformity or atrophy.  Uses walker for ambulation Neurologic: Pain and light touch intact in extremities.  Symmetrical.  Speech is fluent. Motor exam as listed above. Psychiatric: Judgment intact,  Mood & affect appropriate for pt's clinical situation. Dermatologic: No Venous rashes.   No changes consistent with cellulitis. Lymph : No Cervical lymphadenopathy, no lichenification or skin changes of chronic lymphedema.   Social History   Socioeconomic History  . Marital status: Married    Spouse name: Not on file  . Number of children: Not on file  . Years of education: Not on file  . Highest education level: Not on file  Occupational History  . Not on file  Social Needs  . Financial resource strain: Not on file  . Food insecurity:    Worry: Not on file     Inability: Not on file  . Transportation needs:    Medical: Not on file    Non-medical: Not on file  Tobacco Use  . Smoking status: Never Smoker  . Smokeless tobacco: Never Used  Substance and Sexual Activity  . Alcohol use: No  . Drug use: No  . Sexual activity: Not on file  Lifestyle  . Physical activity:    Days per week: Not on file    Minutes per session: Not on file  . Stress: Not on file  Relationships  . Social connections:    Talks on phone: Not on file    Gets together: Not on file    Attends religious service: Not on file    Active member of club or organization: Not on file    Attends meetings of clubs or organizations: Not on file    Relationship status: Not on file  . Intimate partner violence:    Fear of current or ex partner: Not on file    Emotionally abused: Not on file    Physically abused: Not on file    Forced sexual activity: Not on file  Other Topics Concern  . Not on file  Social History Narrative  . Not on file    Past Surgical History:  Procedure Laterality Date  . A/V FISTULAGRAM Left 01/09/2017   Procedure: A/V Fistulagram;  Surgeon: [name], MD;  Location: [place] [facility];  Service: Cardiovascular;  Laterality: Left;  . A/V FISTULAGRAM Left 06/06/2017   Procedure: A/V Fistulagram;  Surgeon: [name], MD;  Location: [place] [facility];  Service: Cardiovascular;  Laterality: Left;  . A/V FISTULAGRAM Left 08/15/2017   Procedure: A/V Fistulagram;  Surgeon: [name], MD;  Location: [place] [facility];  Service: Cardiovascular;  Laterality: Left;  . A/V FISTULAGRAM Left 01/22/2018   Procedure: A/V FISTULAGRAM;  Surgeon: [name], MD;  Location: [place] [facility];  Service: Cardiovascular;  Laterality: Left;  . A/V SHUNT INTERVENTION N/A 06/06/2017   Procedure: A/V SHUNT INTERVENTION;  Surgeon: [name], MD;  Location: [place] [facility];  Service: Cardiovascular;  Laterality: N/A;  . AMPUTATION Right 04/25/2016    Procedure: TOE AMPUTATION;  Surgeon: Katha Cabal, MD;  Location: [facility];  Service: Vascular;  Laterality: Right;  . APPLICATION OF WOUND VAC Right 04/25/2016   Procedure: APPLICATION OF WOUND VAC;  Surgeon: Katha Cabal, MD;  Location: [facility];  Service: Vascular;  Laterality: Right;  . AV FISTULA PLACEMENT Left 2015  . CARDIAC CATHETERIZATION N/A 04/30/2016   Procedure: Left Heart Cath and Coronary Angiography;  Surgeon: Wellington Hampshire, MD;  Location: [place] [facility];  Service: Cardiovascular;  Laterality: N/A;  . CATARACT EXTRACTION, BILATERAL    . LEG AMPUTATION BELOW KNEE Left   . PERIPHERAL VASCULAR CATHETERIZATION N/A  10/03/2015   Procedure: A/V Shuntogram/Fistulagram;  Surgeon: [name], MD;  Location: [place] [facility];  Service: Cardiovascular;  Laterality: N/A;  . PERIPHERAL VASCULAR CATHETERIZATION N/A 01/16/2016   Procedure: Abdominal Aortogram w/Lower Extremity;  Surgeon: [name], MD;  Location: [place] [facility];  Service: Cardiovascular;  Laterality: N/A;  . PERIPHERAL VASCULAR CATHETERIZATION  01/16/2016   Procedure: Lower Extremity Intervention;  Surgeon: [name], MD;  Location: [place] [facility];  Service: Cardiovascular;;  . PERIPHERAL VASCULAR CATHETERIZATION Right 04/27/2016   Procedure: Lower Extremity Angiography;  Surgeon: Katha Cabal, MD;  Location: [place] [facility];  Service: Cardiovascular;  Laterality: Right;  . PERIPHERAL VASCULAR CATHETERIZATION  04/27/2016   Procedure: Lower Extremity Intervention;  Surgeon: Katha Cabal, MD;  Location: [place] [facility];  Service: Cardiovascular;;  . TRANSMETATARSAL AMPUTATION Right 04/28/2016   Procedure: TRANSMETATARSAL AMPUTATION;  Surgeon: Albertine Patricia, DPM;  Location: [facility];  Service: Podiatry;  Laterality: Right;  . WOUND DEBRIDEMENT Right 04/25/2016   Procedure: DEBRIDEMENT WOUND;  Surgeon: Katha Cabal, MD;  Location: [facility];  Service: Vascular;  Laterality:  Right;    Family History  Problem Relation Age of Onset  . Diabetes Father   . Prostate cancer Father     No Known Allergies     Assessment & Plan:   1. PVD (peripheral vascular disease) ([place])  Recommend:  The patient has evidence of severe atherosclerotic changes of both lower extremities associated with ulceration and tissue loss of the foot.  This represents a limb threatening ischemia and places the patient at the risk for limb loss.  Patient should undergo angiography of the left lower extremity with the hope for intervention for limb salvage.  The risks and benefits as well as the alternative therapies was discussed in detail with the patient.  All questions were answered.  Patient agrees to proceed with angiography.  The patient will follow up with me in the office after the procedure.    2. Diabetic polyneuropathy associated with type 2 diabetes mellitus ([place]) Continue hypoglycemic medications as already ordered, these medications have been reviewed and there are no changes at this time.  Hgb A1C to be monitored as already arranged by primary service   3. ESRD on dialysis [facility]) Currently having no issues with hemodialysis.  Will continue on routine monitoring schedule.   Current Outpatient Medications on File Prior to Visit  Medication Sig Dispense Refill  . acetaminophen (TYLENOL) 325 MG tablet Take by mouth.    Marland Kitchen amLODipine (NORVASC) 10 MG tablet Take 10 mg by mouth.    Marland Kitchen aspirin EC 81 MG EC tablet Take 1 tablet (81 mg total) by mouth daily. 30 tablet 0  . atorvastatin (LIPITOR) 20 MG tablet     . atorvastatin (LIPITOR) 40 MG tablet Take by mouth.    Marland Kitchen b complex-vitamin c-folic acid (NEPHRO-VITE) 0.8 MG TABS tablet Take 1 tablet by mouth daily.    . bisacodyl (DULCOLAX) 5 MG EC tablet Take 20 mg by mouth daily as needed for moderate constipation.    . calcium acetate (PHOSLO) 667 MG capsule Take 2,001 mg by mouth 3 (three) times daily with meals. Take 667 mg with  snacks    . calcium carbonate (TUMS) 500 MG chewable tablet Chew by mouth.    . cinacalcet (SENSIPAR) 60 MG tablet Take 60 mg by mouth 3 (three) times daily.    Marland Kitchen epoetin alfa (PROCRIT) 70350 UNIT/ML injection Inject into the  skin.    . glipiZIDE (GLUCOTROL) 10 MG tablet Take 10 mg by mouth daily.     Marland Kitchen lidocaine-prilocaine (EMLA) cream Apply 1 application topically as needed (port access).    . midodrine (PROAMATINE) 10 MG tablet Take 10 mg by mouth daily on Mon, Tues, and Fri at dialysis    . omeprazole (PRILOSEC) 20 MG capsule Take 20 mg by mouth daily.    . polyethylene glycol (MIRALAX / GLYCOLAX) packet Take 17 g by mouth daily as needed for mild constipation.    . sevelamer carbonate (RENVELA) 800 MG tablet Take 800 mg by mouth 3 (three) times daily with meals.    Marland Kitchen amoxicillin-clavulanate (AUGMENTIN) 875-125 MG tablet Take 1 tablet by mouth 2 (two) times daily. (Patient not taking: Reported on 01/22/2018) 20 tablet 0   No current facility-administered medications on file prior to visit.     There are no Patient Instructions on file for this visit. No follow-ups on file.   Kris Hartmann, NP

## 2018-07-18 ENCOUNTER — Other Ambulatory Visit (INDEPENDENT_AMBULATORY_CARE_PROVIDER_SITE_OTHER): Payer: Self-pay | Admitting: Nurse Practitioner

## 2018-07-23 MED ORDER — CEFAZOLIN SODIUM-DEXTROSE 1-4 GM/50ML-% IV SOLN
1.0000 g | Freq: Once | INTRAVENOUS | Status: AC
  Administered 2018-07-24: 1 g via INTRAVENOUS

## 2018-07-24 ENCOUNTER — Ambulatory Visit
Admission: RE | Admit: 2018-07-24 | Discharge: 2018-07-24 | Disposition: A | Discharge status: Home / Self Care | Payer: Medicare Other | Source: Ambulatory Visit | Attending: Vascular Surgery | Admitting: Vascular Surgery

## 2018-07-24 ENCOUNTER — Encounter: Payer: Self-pay | Admitting: *Deleted

## 2018-07-24 ENCOUNTER — Encounter
Admission: RE | Disposition: A | Discharge status: Home / Self Care | Payer: Self-pay | Source: Ambulatory Visit | Attending: Vascular Surgery

## 2018-07-24 DIAGNOSIS — Z89421 Acquired absence of other right toe(s): Secondary | ICD-10-CM | POA: Diagnosis not present

## 2018-07-24 DIAGNOSIS — Z9842 Cataract extraction status, left eye: Secondary | ICD-10-CM | POA: Insufficient documentation

## 2018-07-24 DIAGNOSIS — I120 Hypertensive chronic kidney disease with stage 5 chronic kidney disease or end stage renal disease: Secondary | ICD-10-CM | POA: Diagnosis not present

## 2018-07-24 DIAGNOSIS — Z89512 Acquired absence of left leg below knee: Secondary | ICD-10-CM | POA: Insufficient documentation

## 2018-07-24 DIAGNOSIS — E11621 Type 2 diabetes mellitus with foot ulcer: Secondary | ICD-10-CM | POA: Insufficient documentation

## 2018-07-24 DIAGNOSIS — Z79899 Other long term (current) drug therapy: Secondary | ICD-10-CM | POA: Insufficient documentation

## 2018-07-24 DIAGNOSIS — Z7982 Long term (current) use of aspirin: Secondary | ICD-10-CM | POA: Diagnosis not present

## 2018-07-24 DIAGNOSIS — E1142 Type 2 diabetes mellitus with diabetic polyneuropathy: Secondary | ICD-10-CM | POA: Insufficient documentation

## 2018-07-24 DIAGNOSIS — I70239 Atherosclerosis of native arteries of right leg with ulceration of unspecified site: Secondary | ICD-10-CM | POA: Diagnosis not present

## 2018-07-24 DIAGNOSIS — E7800 Pure hypercholesterolemia, unspecified: Secondary | ICD-10-CM | POA: Diagnosis not present

## 2018-07-24 DIAGNOSIS — Z992 Dependence on renal dialysis: Secondary | ICD-10-CM | POA: Insufficient documentation

## 2018-07-24 DIAGNOSIS — L97511 Non-pressure chronic ulcer of other part of right foot limited to breakdown of skin: Secondary | ICD-10-CM | POA: Insufficient documentation

## 2018-07-24 DIAGNOSIS — E1122 Type 2 diabetes mellitus with diabetic chronic kidney disease: Secondary | ICD-10-CM | POA: Insufficient documentation

## 2018-07-24 DIAGNOSIS — I70269 Atherosclerosis of native arteries of extremities with gangrene, unspecified extremity: Secondary | ICD-10-CM

## 2018-07-24 DIAGNOSIS — Z8673 Personal history of transient ischemic attack (TIA), and cerebral infarction without residual deficits: Secondary | ICD-10-CM | POA: Diagnosis not present

## 2018-07-24 DIAGNOSIS — N186 End stage renal disease: Secondary | ICD-10-CM | POA: Insufficient documentation

## 2018-07-24 DIAGNOSIS — I70235 Atherosclerosis of native arteries of right leg with ulceration of other part of foot: Secondary | ICD-10-CM | POA: Insufficient documentation

## 2018-07-24 DIAGNOSIS — Z7984 Long term (current) use of oral hypoglycemic drugs: Secondary | ICD-10-CM | POA: Diagnosis not present

## 2018-07-24 DIAGNOSIS — Z9841 Cataract extraction status, right eye: Secondary | ICD-10-CM | POA: Diagnosis not present

## 2018-07-24 DIAGNOSIS — Z833 Family history of diabetes mellitus: Secondary | ICD-10-CM | POA: Diagnosis not present

## 2018-07-24 DIAGNOSIS — L97919 Non-pressure chronic ulcer of unspecified part of right lower leg with unspecified severity: Secondary | ICD-10-CM

## 2018-07-24 DIAGNOSIS — I7092 Chronic total occlusion of artery of the extremities: Secondary | ICD-10-CM

## 2018-07-24 DIAGNOSIS — Z9889 Other specified postprocedural states: Secondary | ICD-10-CM | POA: Insufficient documentation

## 2018-07-24 DIAGNOSIS — I739 Peripheral vascular disease, unspecified: Secondary | ICD-10-CM

## 2018-07-24 HISTORY — PX: LOWER EXTREMITY ANGIOGRAPHY: CATH118251

## 2018-07-24 LAB — CREATININE, SERUM
Creatinine, Ser: 8.26 mg/dL — ABNORMAL HIGH (ref 0.61–1.24)
GFR calc Af Amer: 7 mL/min — ABNORMAL LOW (ref >60)
GFR calc non Af Amer: 6 mL/min — ABNORMAL LOW (ref >60)

## 2018-07-24 LAB — GLUCOSE, CAPILLARY: Glucose-Capillary: 218 mg/dL — ABNORMAL HIGH (ref 70–99)

## 2018-07-24 LAB — BUN: BUN: 34 mg/dL — ABNORMAL HIGH (ref 6–20)

## 2018-07-24 LAB — POTASSIUM (ARMC VASCULAR LAB ONLY): POTASSIUM (ARMC VASCULAR LAB): 5.2 — AB (ref 3.5–5.1)

## 2018-07-24 SURGERY — LOWER EXTREMITY ANGIOGRAPHY
Anesthesia: Moderate Sedation | Site: Leg Lower | Laterality: Right

## 2018-07-24 MED ORDER — FENTANYL CITRATE (PF) 100 MCG/2ML IJ SOLN
INTRAMUSCULAR | Status: DC | PRN
  Administered 2018-07-24 (×2): 50 ug via INTRAVENOUS

## 2018-07-24 MED ORDER — SODIUM CHLORIDE 0.9 % IV SOLN: 250.0000 mL | INTRAVENOUS | Status: DC | PRN

## 2018-07-24 MED ORDER — CEFAZOLIN SODIUM-DEXTROSE 1-4 GM/50ML-% IV SOLN
INTRAVENOUS | Status: AC
  Administered 2018-07-24: 1 g via INTRAVENOUS
  Filled 2018-07-24: qty 50

## 2018-07-24 MED ORDER — HEPARIN SODIUM (PORCINE) 1000 UNIT/ML IJ SOLN
INTRAMUSCULAR | Status: AC
  Filled 2018-07-24: qty 1

## 2018-07-24 MED ORDER — CLOPIDOGREL BISULFATE 75 MG PO TABS: 75.0000 mg | ORAL_TABLET | Freq: Every day | ORAL | 11 refills | Status: DC

## 2018-07-24 MED ORDER — LABETALOL HCL 5 MG/ML IV SOLN: 10.0000 mg | INTRAVENOUS | Status: DC | PRN

## 2018-07-24 MED ORDER — LIDOCAINE-EPINEPHRINE (PF) 1 %-1:200000 IJ SOLN
INTRAMUSCULAR | Status: AC
  Filled 2018-07-24: qty 30

## 2018-07-24 MED ORDER — FENTANYL CITRATE (PF) 100 MCG/2ML IJ SOLN
INTRAMUSCULAR | Status: AC
  Filled 2018-07-24: qty 2

## 2018-07-24 MED ORDER — HEPARIN (PORCINE) IN NACL 1000-0.9 UT/500ML-% IV SOLN
INTRAVENOUS | Status: AC
  Filled 2018-07-24: qty 1000

## 2018-07-24 MED ORDER — IOPAMIDOL (ISOVUE-300) INJECTION 61%
INTRAVENOUS | Status: DC | PRN
  Administered 2018-07-24: 85 mL via INTRA_ARTERIAL

## 2018-07-24 MED ORDER — HEPARIN SODIUM (PORCINE) 1000 UNIT/ML IJ SOLN
INTRAMUSCULAR | Status: DC | PRN
  Administered 2018-07-24: 5000 [IU] via INTRAVENOUS

## 2018-07-24 MED ORDER — ASPIRIN EC 81 MG PO TBEC: 81.0000 mg | DELAYED_RELEASE_TABLET | Freq: Every day | ORAL | Status: DC

## 2018-07-24 MED ORDER — HYDRALAZINE HCL 20 MG/ML IJ SOLN: 5.0000 mg | INTRAMUSCULAR | Status: DC | PRN

## 2018-07-24 MED ORDER — SODIUM CHLORIDE 0.9 % IV SOLN: INTRAVENOUS | Status: DC

## 2018-07-24 MED ORDER — CLOPIDOGREL BISULFATE 75 MG PO TABS: 75.0000 mg | ORAL_TABLET | Freq: Every day | ORAL | Status: DC

## 2018-07-24 MED ORDER — ACETAMINOPHEN 325 MG PO TABS: 650.0000 mg | ORAL_TABLET | ORAL | Status: DC | PRN

## 2018-07-24 MED ORDER — MIDAZOLAM HCL 5 MG/5ML IJ SOLN
INTRAMUSCULAR | Status: AC
  Filled 2018-07-24: qty 5

## 2018-07-24 MED ORDER — ONDANSETRON HCL 4 MG/2ML IJ SOLN: 4.0000 mg | Freq: Four times a day (QID) | INTRAMUSCULAR | Status: DC | PRN

## 2018-07-24 MED ORDER — MIDAZOLAM HCL 2 MG/2ML IJ SOLN
INTRAMUSCULAR | Status: DC | PRN
  Administered 2018-07-24 (×2): 1 mg via INTRAVENOUS
  Administered 2018-07-24: 2 mg via INTRAVENOUS

## 2018-07-24 MED ORDER — SODIUM CHLORIDE 0.9% FLUSH: 3.0000 mL | INTRAVENOUS | Status: DC | PRN

## 2018-07-24 MED ORDER — HYDROMORPHONE HCL 1 MG/ML IJ SOLN: 1.0000 mg | Freq: Once | INTRAMUSCULAR | Status: DC | PRN

## 2018-07-24 MED ORDER — SODIUM CHLORIDE 0.9% FLUSH: 3.0000 mL | Freq: Two times a day (BID) | INTRAVENOUS | Status: DC

## 2018-07-24 SURGICAL SUPPLY — 20 items
BALLN LUTONIX 018 4X100X130 (BALLOONS) ×3
BALLN LUTONIX 018 5X220X130 (BALLOONS) ×6
BALLOON LUTONIX 018 4X100X130 (BALLOONS) ×1 IMPLANT
BALLOON LUTONIX 018 5X220X130 (BALLOONS) ×2 IMPLANT
CATH BEACON 5 .038 100 VERT TP (CATHETERS) ×3 IMPLANT
CATH CXI SUPP 2.6F 150 ANG (CATHETERS) ×3 IMPLANT
CATH PIG 70CM (CATHETERS) ×3 IMPLANT
DEVICE PRESTO INFLATION (MISCELLANEOUS) ×3 IMPLANT
DEVICE STARCLOSE SE CLOSURE (Vascular Products) ×3 IMPLANT
PACK ANGIOGRAPHY (CUSTOM PROCEDURE TRAY) ×3 IMPLANT
SHEATH ANL2 6FRX45 HC (SHEATH) ×3 IMPLANT
SHEATH BRITE TIP 5FRX11 (SHEATH) ×3 IMPLANT
STENT VIABAHN 6X250X120 (Permanent Stent) ×3 IMPLANT
STENT VIABAHN 6X50X120 (Permanent Stent) ×3 IMPLANT
TUBING CONTRAST HIGH PRESS 72 (TUBING) ×3 IMPLANT
WIRE COMMAND ST 018 300CM (WIRE) ×3 IMPLANT
WIRE G V18X300CM (WIRE) ×6 IMPLANT
WIRE HI TORQ STEELCORE18 300CM (WIRE) ×3 IMPLANT
WIRE J 3MM .035X145CM (WIRE) ×3 IMPLANT
WIRE MAGIC TORQUE 260C (WIRE) ×3 IMPLANT

## 2018-07-24 NOTE — Op Note (Signed)
[facility]  Percutaneous Study/Intervention Procedural Note   Date of Surgery: 07/24/2018  Surgeon(s):Roy Tokarz    Assistants:none  Pre-operative Diagnosis: PAD with ulceration RLE  Post-operative diagnosis:  Same  Procedure(s) Performed:             1.  Ultrasound guidance for vascular access left femoral artery             2.  Catheter placement into right SFA from left femoral approach             3.  Aortogram and selective right lower extremity angiogram             4.  Percutaneous transluminal angioplasty of right TP trunk and distal popliteal artery with 4 mm diameter by 10 cm length Lutonix drug-coated angioplasty balloon             5.   Percutaneous transluminal angioplasty of the right SFA and above-knee popliteal artery with two 5 mm diameter by 22 cm length Lutonix drug-coated angioplasty balloons  6.  Viabahn stent placement from the proximal SFA to the distal SFA using a 6 mm diameter by 25 cm length stent and a 6 mm diameter by 5 cm length stent for residual stenosis after angioplasty             7.  StarClose closure device left femoral artery  EBL: 10 cc  Contrast: 85 cc  Fluoro Time: 10.6 minutes  Moderate Conscious Sedation Time: approximately 60 minutes using 4 mg of Versed and 100 Mcg of Fentanyl              Indications:  Patient is a 57 y.o.male with multiple medical issues and a long history of peripheral arterial disease with nonhealing ulcerations on the right foot.The patient is brought in for angiography for further evaluation and potential treatment.  Due to the limb threatening nature of the situation, angiogram was performed for attempted limb salvage. The patient is aware that if the procedure fails, amputation would be expected.  The patient also understands that even with successful revascularization, amputation may still be required due to the severity of the situation.  Risks and benefits are discussed and informed consent  is obtained.   Procedure:  The patient was identified and appropriate procedural time out was performed.  The patient was then placed supine on the table and prepped and draped in the usual sterile fashion. Moderate conscious sedation was administered during a face to face encounter with the patient throughout the procedure with my supervision of the RN administering medicines and monitoring the patient's vital signs, pulse oximetry, telemetry and mental status throughout from the start of the procedure until the patient was taken to the recovery room. Ultrasound was used to evaluate the left common femoral artery.  It was patent .  A digital ultrasound image was acquired.  A Seldinger needle was used to access the left common femoral artery under direct ultrasound guidance and a permanent image was performed.  A 0.035 J wire was advanced without resistance and a 5Fr sheath was placed.  Pigtail catheter was placed into the aorta and an AP aortogram was performed. This demonstrated sluggish renal artery flow bilaterally and normal aorta and iliac segments without significant stenosis. I then crossed the aortic bifurcation and advanced to the proximal right SFA. Selective right lower extremity angiogram was then performed. This demonstrated Diffusely diseased superficial femoral artery with multiple areas of greater than 70% stenosis throughout the course.  The popliteal artery was fairly normal in the mid segment, but the distal popliteal artery and tibioperoneal trunk had about a 70 to 80% stenosis.  The peroneal artery was really the only runoff distally, and it occluded above its normal termination but had extensive collaterals to the ankle.  There was severe small vessel disease.  The anterior tibial and posterior tibial arteries were chronically occluded. It was felt that it was in the patient's best interest to proceed with intervention after these images to avoid a second procedure and a larger amount of  contrast and fluoroscopy based off of the findings from the initial angiogram. The patient was systemically heparinized and a 6 [place] Ansell sheath was then placed over the Magic tourque wire. I then used a Kumpe catheter and the Magic tourque wire to navigate down through the SFA disease and into the below knee popliteal artery.  And then exchanged for a 0.018 wire and a CXI catheter to get down into the tibial vessels.  I was able to get into the posterior tibial artery, but was never able to cross the occlusion and get into an intraluminal vessel distally so this was abandoned.  I then parked the wire in the peroneal artery and used a 4 mm diameter by 10 cm length Lutonix drug-coated angioplasty balloon to treat the most distal popliteal artery and the tibioperoneal trunk.  This was inflated to 8 atm for 1 minute with less than 30% residual stenosis identified.  To treat the SFA and above-knee popliteal artery, 2 separate 5 mm diameter by 22 cm length Lutonix drug-coated angioplasty balloons were used.  The more distal inflation was up to 10 atm for 1 minute with a more proximal inflation of the proximal SFA at 8 atm for 1 minute.  Following angioplasty, and the proximal SFA, mid SFA, and distal SFA there were multiple areas of greater than 50% residual stenosis and wall irregularity.  A 6 mm diameter by 25 cm length and a 6 mm diameter by 5 cm length Viabahn stent were deployed from Hunter's canal up to the proximal SFA to encompass these lesions.  They were postdilated with 5 mm balloons with excellent angiographic completion result and less than 10% residual stenosis. I elected to terminate the procedure. The sheath was removed and StarClose closure device was deployed in the left femoral artery with excellent hemostatic result. The patient was taken to the recovery room in stable condition having tolerated the procedure well.  Findings:               Aortogram:  This demonstrated sluggish renal artery  flow bilaterally and normal aorta and iliac segments without significant stenosis.             Right lower Extremity:  Diffusely diseased superficial femoral artery with multiple areas of greater than 70% stenosis throughout the course.  The popliteal artery was fairly normal in the mid segment, but the distal popliteal artery and tibioperoneal trunk had about a 70 to 80% stenosis.  The peroneal artery was really the only runoff distally, and it occluded above its normal termination but had extensive collaterals to the ankle.  There was severe small vessel disease.  The anterior tibial and posterior tibial arteries were chronically occluded   Disposition: Patient was taken to the recovery room in stable condition having tolerated the procedure well.  Complications: None  Leotis Pain 07/24/2018 12:37 PM   This note was created with Dragon Medical transcription system. Any errors in  dictation are purely unintentional.

## 2018-07-24 NOTE — H&P (Signed)
[facility] [facility] History & Physical Update  The patient was interviewed and re-examined.  The patient's previous History and Physical has been reviewed and is unchanged.  There is no change in the plan of care. We plan to proceed with the scheduled procedure.  Leotis Pain, MD  07/24/2018, 9:30 AM

## 2018-07-24 NOTE — Discharge Instructions (Signed)
Sedacin consciente [employer] adultos, cuidados posteriores (Moderate Conscious Sedation, Adult, Care After) Estas indicaciones le proporcionan informacin acerca de cmo deber cuidarse despus del procedimiento. El mdico tambin podr darle instrucciones ms especficas. El tratamiento ha sido planificado segn las prcticas mdicas actuales, pero en algunos casos pueden ocurrir problemas. Comunquese con el mdico si tiene algn problema o dudas despus del procedimiento. QU ESPERAR DESPUS DEL PROCEDIMIENTO Despus del procedimiento, es comn:  Sentirse somnoliento durante varias horas.  Sentirse torpe y [employer] de equilibrio durante varias horas.  Perder el sentido de la realidad durante varias horas.  Vomitar si come [employer]. INSTRUCCIONES PARA EL CUIDADO EN EL HOGAR [place] al menos 24horas despus del procedimiento:  No haga lo siguiente: ? Participar en actividades que impliquen posibles cadas o lesiones. ? Conducir vehculos. ? Operar maquinarias pesadas. ? Beber alcohol. ? Tomar somnferos o medicamentos que causen somnolencia. ? Firmar documentos legales ni tomar [employer]. ? Cuidar a nios por su cuenta.  Hacer reposo. Comida y bebida  Siga la dieta recomendada por el mdico.  Si vomita: ? Pruebe agua, jugo o sopa cuando usted pueda beber sin vomitar. ? Asegrese de no tener nuseas antes de ingerir alimentos slidos. Instrucciones generales  Permanezca con un adulto responsable hasta que est completamente despierto y consciente.  Tome los medicamentos de venta libre y los recetados solamente como se lo haya indicado el mdico.  Si fuma, no lo haga sin supervisin.  Concurra a todas las visitas de control como se lo haya indicado el mdico. Esto es importante. SOLICITE ATENCIN MDICA SI:  Sigue teniendo nuseas o vomitando.  Tiene sensacin de desvanecimiento.  Le aparece una erupcin cutnea.  Tiene fiebre. SOLICITE  ATENCIN MDICA DE INMEDIATO SI:  Tiene dificultad para respirar. Esta informacin no tiene Marine scientist el consejo del mdico. Asegrese de hacerle al mdico cualquier pregunta que tenga. Document Released: 11/10/2012 Document Revised: 10/22/2014 Document Reviewed: 01/21/2016 Elsevier Interactive Patient Education  2018 [place], cuidados posteriores (Angiogram, Care After) Estas indicaciones le proporcionan informacin acerca de cmo deber cuidarse despus del procedimiento. El mdico tambin podr darle instrucciones especficas. Comunquese con el mdico si tiene algn problema o tiene preguntas despus del procedimiento. [place] los medicamentos solamente como se lo haya indicado el [place] indicaciones de su mdico respecto de lo siguiente: ? Cuidado del Environmental consultant donde se insert Print production planner. ? Cambiar y Music therapist venda (vendaje).  Puede ducharse 24a 48horas despus del procedimiento o como se lo haya indicado el mdico.  No se d baos de inmersin, no practique natacin ni use el jacuzzi hasta que el mdico lo apruebe.  Revise diariamente el lugar donde se insert el catter. Est atento a lo siguiente: ? Dolor, hinchazn o enrojecimiento. ? Lquido, sangre o pus.  No se aplique talcos ni lociones en el lugar.  No levante ningn objeto que pese ms de 10libras (4,5kg) durante 5das o como se lo haya indicado el mdico.  Pregunte a su mdico cundo podr hacer lo siguiente: ? Regresar a la escuela o al [place]. ? Realizar actividades fsicas o practicar deportes. ? Tener [employer].  No conduzca ni opere maquinaria pesada durante 24horas o como se lo haya indicado el mdico.  Pida a alguien que lo [employer] las primeras 24horas despus del procedimiento.  Concurra a todas las visitas de control como se lo haya indicado el mdico. Esto es importante.  SOLICITE AYUDA SI:  Tiene fiebre.  Tiene  escalofros.  Aumenta el sangrado en el lugar donde se insert el catter. Haga presin en la zona.  Tiene enrojecimiento, hinchazn o siente calor en el lugar donde se insert el catter.  Presenta un lquido o pus que sale de la zona.  SOLICITE AYUDA DE INMEDIATO SI:  Siente mucho dolor en el lugar donde se insert el catter.  El Environmental consultant de insercin del catter est sangrando, y el sangrado no se detiene despus de 64minutos de Oceanographer una presin constante.  La zona que est cerca o a poca distancia del lugar de insercin del catter se pone plida, fra, o siente hormigueo o adormecimiento.  Esta informacin no tiene Marine scientist el consejo del mdico. Asegrese de hacerle al mdico cualquier pregunta que tenga. Document Released: 01/26/2013 Document Revised: 10/22/2014 Document Reviewed: 03/04/2013 Elsevier Interactive Patient Education  2017 [employer].

## 2018-07-25 ENCOUNTER — Encounter: Payer: Self-pay | Admitting: Vascular Surgery

## 2018-08-11 ENCOUNTER — Telehealth (INDEPENDENT_AMBULATORY_CARE_PROVIDER_SITE_OTHER): Payer: Self-pay | Admitting: Vascular Surgery

## 2018-08-12 ENCOUNTER — Other Ambulatory Visit: Payer: Self-pay

## 2018-08-12 ENCOUNTER — Inpatient Hospital Stay
Admission: EM | Admit: 2018-08-12 | Discharge: 2018-08-14 | DRG: 270 | Disposition: A | Discharge status: Home / Self Care | Payer: Medicare Other | Attending: Internal Medicine | Admitting: Internal Medicine

## 2018-08-12 DIAGNOSIS — N186 End stage renal disease: Secondary | ICD-10-CM | POA: Diagnosis present

## 2018-08-12 DIAGNOSIS — E7800 Pure hypercholesterolemia, unspecified: Secondary | ICD-10-CM | POA: Diagnosis present

## 2018-08-12 DIAGNOSIS — E1122 Type 2 diabetes mellitus with diabetic chronic kidney disease: Secondary | ICD-10-CM | POA: Diagnosis present

## 2018-08-12 DIAGNOSIS — Z89421 Acquired absence of other right toe(s): Secondary | ICD-10-CM

## 2018-08-12 DIAGNOSIS — E1152 Type 2 diabetes mellitus with diabetic peripheral angiopathy with gangrene: Secondary | ICD-10-CM | POA: Diagnosis present

## 2018-08-12 DIAGNOSIS — I743 Embolism and thrombosis of arteries of the lower extremities: Secondary | ICD-10-CM | POA: Diagnosis not present

## 2018-08-12 DIAGNOSIS — Z7982 Long term (current) use of aspirin: Secondary | ICD-10-CM | POA: Diagnosis not present

## 2018-08-12 DIAGNOSIS — Z8673 Personal history of transient ischemic attack (TIA), and cerebral infarction without residual deficits: Secondary | ICD-10-CM

## 2018-08-12 DIAGNOSIS — D631 Anemia in chronic kidney disease: Secondary | ICD-10-CM | POA: Diagnosis present

## 2018-08-12 DIAGNOSIS — Z79899 Other long term (current) drug therapy: Secondary | ICD-10-CM | POA: Diagnosis not present

## 2018-08-12 DIAGNOSIS — N2581 Secondary hyperparathyroidism of renal origin: Secondary | ICD-10-CM | POA: Diagnosis present

## 2018-08-12 DIAGNOSIS — I120 Hypertensive chronic kidney disease with stage 5 chronic kidney disease or end stage renal disease: Secondary | ICD-10-CM | POA: Diagnosis present

## 2018-08-12 DIAGNOSIS — Z992 Dependence on renal dialysis: Secondary | ICD-10-CM

## 2018-08-12 DIAGNOSIS — Z7902 Long term (current) use of antithrombotics/antiplatelets: Secondary | ICD-10-CM

## 2018-08-12 DIAGNOSIS — I7090 Unspecified atherosclerosis: Secondary | ICD-10-CM | POA: Diagnosis present

## 2018-08-12 DIAGNOSIS — I739 Peripheral vascular disease, unspecified: Secondary | ICD-10-CM

## 2018-08-12 DIAGNOSIS — Z7984 Long term (current) use of oral hypoglycemic drugs: Secondary | ICD-10-CM | POA: Diagnosis not present

## 2018-08-12 DIAGNOSIS — Z89512 Acquired absence of left leg below knee: Secondary | ICD-10-CM | POA: Diagnosis not present

## 2018-08-12 DIAGNOSIS — I998 Other disorder of circulatory system: Secondary | ICD-10-CM

## 2018-08-12 DIAGNOSIS — I70229 Atherosclerosis of native arteries of extremities with rest pain, unspecified extremity: Secondary | ICD-10-CM

## 2018-08-12 LAB — CBC WITH DIFFERENTIAL/PLATELET
Abs Immature Granulocytes: 0.07 10*3/uL (ref 0.00–0.07)
Basophils Absolute: 0.1 10*3/uL (ref 0.0–0.1)
Basophils Relative: 1 %
EOS PCT: 4 %
Eosinophils Absolute: 0.6 10*3/uL — ABNORMAL HIGH (ref 0.0–0.5)
HEMATOCRIT: 40.9 % (ref 39.0–52.0)
HEMOGLOBIN: 12.4 g/dL — AB (ref 13.0–17.0)
Immature Granulocytes: 1 %
LYMPHS PCT: 17 %
Lymphs Abs: 2.2 10*3/uL (ref 0.7–4.0)
MCH: 30.3 pg (ref 26.0–34.0)
MCHC: 30.3 g/dL (ref 30.0–36.0)
MCV: 100 fL (ref 80.0–100.0)
MONO ABS: 1.4 10*3/uL — AB (ref 0.1–1.0)
MONOS PCT: 11 %
NRBC: 0 % (ref 0.0–0.2)
Neutro Abs: 8.3 10*3/uL — ABNORMAL HIGH (ref 1.7–7.7)
Neutrophils Relative %: 66 %
Platelets: 359 10*3/uL (ref 150–400)
RBC: 4.09 MIL/uL — ABNORMAL LOW (ref 4.22–5.81)
RDW: 15.7 % — ABNORMAL HIGH (ref 11.5–15.5)
WBC: 12.6 10*3/uL — ABNORMAL HIGH (ref 4.0–10.5)

## 2018-08-12 LAB — COMPREHENSIVE METABOLIC PANEL
ALK PHOS: 235 U/L — AB (ref 38–126)
ALT: 18 U/L (ref 0–44)
ANION GAP: 14 (ref 5–15)
AST: 37 U/L (ref 15–41)
Albumin: 3.9 g/dL (ref 3.5–5.0)
BILIRUBIN TOTAL: 0.6 mg/dL (ref 0.3–1.2)
BUN: 38 mg/dL — ABNORMAL HIGH (ref 6–20)
CALCIUM: 8 mg/dL — AB (ref 8.9–10.3)
CO2: 30 mmol/L (ref 22–32)
CREATININE: 6.87 mg/dL — AB (ref 0.61–1.24)
Chloride: 97 mmol/L — ABNORMAL LOW (ref 98–111)
GFR, EST AFRICAN AMERICAN: 9 mL/min — AB (ref >60)
GFR, EST NON AFRICAN AMERICAN: 8 mL/min — AB (ref >60)
Glucose, Bld: 198 mg/dL — ABNORMAL HIGH (ref 70–99)
Potassium: 5.1 mmol/L (ref 3.5–5.1)
Sodium: 141 mmol/L (ref 135–145)
TOTAL PROTEIN: 7.7 g/dL (ref 6.5–8.1)

## 2018-08-12 LAB — PROTIME-INR
INR: 0.96
PROTHROMBIN TIME: 12.7 s (ref 11.4–15.2)

## 2018-08-12 LAB — LACTIC ACID, PLASMA: Lactic Acid, Venous: 1.7 mmol/L (ref 0.5–1.9)

## 2018-08-12 LAB — HEPARIN LEVEL (UNFRACTIONATED): Heparin Unfractionated: 0.82 IU/mL — ABNORMAL HIGH (ref 0.30–0.70)

## 2018-08-12 MED ORDER — POLYETHYLENE GLYCOL 3350 17 G PO PACK: 17.0000 g | PACK | Freq: Every day | ORAL | Status: DC | PRN

## 2018-08-12 MED ORDER — FENTANYL CITRATE (PF) 100 MCG/2ML IJ SOLN
INTRAMUSCULAR | Status: AC
  Administered 2018-08-13: 50 ug
  Filled 2018-08-12: qty 2

## 2018-08-12 MED ORDER — BISACODYL 5 MG PO TBEC: 20.0000 mg | DELAYED_RELEASE_TABLET | Freq: Every day | ORAL | Status: DC | PRN

## 2018-08-12 MED ORDER — LIDOCAINE-PRILOCAINE 2.5-2.5 % EX CREA
1.0000 "application " | TOPICAL_CREAM | CUTANEOUS | Status: DC | PRN
  Filled 2018-08-12: qty 5

## 2018-08-12 MED ORDER — GLIPIZIDE 10 MG PO TABS
10.0000 mg | ORAL_TABLET | Freq: Every day | ORAL | Status: DC
  Administered 2018-08-14: 10 mg via ORAL
  Filled 2018-08-12 (×2): qty 1

## 2018-08-12 MED ORDER — ONDANSETRON HCL 4 MG PO TABS: 4.0000 mg | ORAL_TABLET | Freq: Four times a day (QID) | ORAL | Status: DC | PRN

## 2018-08-12 MED ORDER — PANTOPRAZOLE SODIUM 40 MG PO TBEC
40.0000 mg | DELAYED_RELEASE_TABLET | Freq: Every day | ORAL | Status: DC
  Administered 2018-08-12 – 2018-08-14 (×3): 40 mg via ORAL
  Filled 2018-08-12 (×3): qty 1

## 2018-08-12 MED ORDER — FENTANYL CITRATE (PF) 100 MCG/2ML IJ SOLN
100.0000 ug | Freq: Once | INTRAMUSCULAR | Status: AC
  Administered 2018-08-12: 100 ug via INTRAVENOUS
  Filled 2018-08-12: qty 2

## 2018-08-12 MED ORDER — SEVELAMER CARBONATE 800 MG PO TABS
800.0000 mg | ORAL_TABLET | Freq: Three times a day (TID) | ORAL | Status: DC
  Administered 2018-08-13 – 2018-08-14 (×2): 800 mg via ORAL
  Filled 2018-08-12 (×2): qty 1

## 2018-08-12 MED ORDER — CLOPIDOGREL BISULFATE 75 MG PO TABS
75.0000 mg | ORAL_TABLET | Freq: Every day | ORAL | Status: DC
  Administered 2018-08-12 – 2018-08-14 (×3): 75 mg via ORAL
  Filled 2018-08-12 (×3): qty 1

## 2018-08-12 MED ORDER — SODIUM CHLORIDE 0.9% FLUSH
3.0000 mL | Freq: Two times a day (BID) | INTRAVENOUS | Status: DC
  Administered 2018-08-12 – 2018-08-14 (×4): 3 mL via INTRAVENOUS

## 2018-08-12 MED ORDER — ASPIRIN EC 81 MG PO TBEC
81.0000 mg | DELAYED_RELEASE_TABLET | Freq: Every day | ORAL | Status: DC
  Administered 2018-08-13 – 2018-08-14 (×2): 81 mg via ORAL
  Filled 2018-08-12 (×2): qty 1

## 2018-08-12 MED ORDER — FENTANYL CITRATE (PF) 100 MCG/2ML IJ SOLN: 50.0000 ug | Freq: Once | INTRAMUSCULAR | Status: AC

## 2018-08-12 MED ORDER — RENA-VITE PO TABS
1.0000 | ORAL_TABLET | Freq: Every day | ORAL | Status: DC
  Administered 2018-08-13 (×2): 1 via ORAL
  Filled 2018-08-12 (×3): qty 1

## 2018-08-12 MED ORDER — SODIUM CHLORIDE 0.9 % IV SOLN
INTRAVENOUS | Status: DC
  Administered 2018-08-12: 23:00:00 via INTRAVENOUS

## 2018-08-12 MED ORDER — ACETAMINOPHEN 650 MG RE SUPP: 650.0000 mg | Freq: Four times a day (QID) | RECTAL | Status: DC | PRN

## 2018-08-12 MED ORDER — ONDANSETRON HCL 4 MG/2ML IJ SOLN
4.0000 mg | Freq: Four times a day (QID) | INTRAMUSCULAR | Status: DC | PRN
  Administered 2018-08-13: 4 mg via INTRAVENOUS
  Filled 2018-08-12: qty 2

## 2018-08-12 MED ORDER — HEPARIN (PORCINE) IN NACL 100-0.45 UNIT/ML-% IJ SOLN
1750.0000 [IU]/h | INTRAMUSCULAR | Status: DC
  Administered 2018-08-12: 1400 [IU]/h via INTRAVENOUS
  Administered 2018-08-12: 1600 [IU]/h via INTRAVENOUS
  Administered 2018-08-14: 1750 [IU]/h via INTRAVENOUS
  Filled 2018-08-12 (×4): qty 250

## 2018-08-12 MED ORDER — SODIUM CHLORIDE 0.9 % IV SOLN: INTRAVENOUS | Status: DC

## 2018-08-12 MED ORDER — DOCUSATE SODIUM 100 MG PO CAPS
100.0000 mg | ORAL_CAPSULE | Freq: Two times a day (BID) | ORAL | Status: DC
  Administered 2018-08-12 – 2018-08-14 (×3): 100 mg via ORAL
  Filled 2018-08-12 (×3): qty 1

## 2018-08-12 MED ORDER — ATORVASTATIN CALCIUM 20 MG PO TABS
20.0000 mg | ORAL_TABLET | Freq: Every day | ORAL | Status: DC
  Administered 2018-08-12 – 2018-08-13 (×3): 20 mg via ORAL
  Filled 2018-08-12 (×2): qty 1

## 2018-08-12 MED ORDER — ACETAMINOPHEN 325 MG PO TABS: 650.0000 mg | ORAL_TABLET | Freq: Four times a day (QID) | ORAL | Status: DC | PRN

## 2018-08-12 MED ORDER — FERROUS SULFATE 325 (65 FE) MG PO TABS
325.0000 mg | ORAL_TABLET | Freq: Three times a day (TID) | ORAL | Status: DC
  Administered 2018-08-13 – 2018-08-14 (×2): 325 mg via ORAL
  Filled 2018-08-12 (×3): qty 1

## 2018-08-12 MED ORDER — HEPARIN BOLUS VIA INFUSION
5000.0000 [IU] | Freq: Once | INTRAVENOUS | Status: AC
  Administered 2018-08-12: 5000 [IU] via INTRAVENOUS
  Filled 2018-08-12: qty 5000

## 2018-08-12 MED ORDER — OXYCODONE HCL 5 MG PO TABS
5.0000 mg | ORAL_TABLET | ORAL | Status: DC | PRN
  Administered 2018-08-13 (×2): 5 mg via ORAL
  Filled 2018-08-12 (×3): qty 1

## 2018-08-12 MED ORDER — CEFAZOLIN SODIUM-DEXTROSE 1-4 GM/50ML-% IV SOLN
1.0000 g | INTRAVENOUS | Status: AC
  Administered 2018-08-13: 1 g via INTRAVENOUS
  Filled 2018-08-12 (×2): qty 50

## 2018-08-12 MED ORDER — HYDROCODONE-ACETAMINOPHEN 5-325 MG PO TABS: 1.0000 | ORAL_TABLET | ORAL | Status: DC | PRN

## 2018-08-12 MED ORDER — CINACALCET HCL 30 MG PO TABS
60.0000 mg | ORAL_TABLET | Freq: Three times a day (TID) | ORAL | Status: DC
  Administered 2018-08-13 – 2018-08-14 (×3): 60 mg via ORAL
  Filled 2018-08-12 (×7): qty 2

## 2018-08-12 NOTE — Progress Notes (Signed)
ANTICOAGULATION CONSULT NOTE - Initial Consult  Pharmacy Consult for heparin drip Indication: claudication  No Known Allergies  Patient Measurements: Height: 5\' 7"  (170.2 cm) Weight: 250 lb (113.4 kg) IBW/kg (Calculated) : 66.1 Heparin Dosing Weight: 95 kg  Vital Signs: BP: 142/54 (10/29 1600) Pulse Rate: 85 (10/29 1600)  Labs: Recent Labs    08/12/18 0601 08/12/18 0631 08/12/18 1727  HGB 12.4*  --   --   HCT 40.9  --   --   PLT 359  --   --   LABPROT  --  12.7  --   INR  --  0.96  --   HEPARINUNFRC  --   --  0.82*  CREATININE 6.87*  --   --     Estimated Creatinine Clearance: 14.1 mL/min (A) (by C-G formula based on SCr of 6.87 mg/dL (H)).   Medical History: Past Medical History:  Diagnosis Date  . Anemia   . Atherosclerosis   . Chronic kidney disease   . Diabetes mellitus without complication ([place])   . Hypercholesteremia   . Hypertension   . Hypotension   . Stroke [facility])     Medications:  No anticoag in PTA meds  Assessment:  HEPARIN COURSE DATE TIME HL CHANGE 1029 0746 -- Start heparin - 5000 units bolus and initial rate 1600 units/hr 1029 1727 0.82 Decrease to 1400 units/hr  Goal of Therapy:  Heparin level 0.3-0.7 units/ml Monitor platelets by anticoagulation protocol: Yes   Plan:  Supratherapeutic HL. Decrease to 1400 units and recheck HL in 8 hours.  Laural Benes, Pharm.D., BCPS Clinical Pharmacist 08/12/2018,6:55 PM

## 2018-08-12 NOTE — Progress Notes (Signed)
[facility]  ROUNDING NOTE   Subjective:   Wife at bedside. History taken with assistance of interpreter  [name] admitted to [facility] on 08/12/2018 for Post op pain, fever  Placed on heparin gtt. Angiogram scheduled for right lower extremity tomorrow.   Patient's last hemodialysis was yesterday.   Objective:  Vital signs in last 24 hours:  Temp:  [98.4 F (36.9 C)] 98.4 F (36.9 C) (10/29 0556) Pulse Rate:  [81-86] 81 (10/29 0620) Resp:  [16-18] 18 (10/29 0620) BP: (113-163)/(47-73) 163/47 (10/29 1200) SpO2:  [97 %-99 %] 97 % (10/29 0620) Weight:  [113.4 kg] 113.4 kg (10/29 0556)  Weight change:  Filed Weights   08/12/18 0556  Weight: 113.4 kg    Intake/Output: No intake/output data recorded.   Intake/Output this shift:  No intake/output data recorded.  Physical Exam: General: NAD,   Head: Normocephalic, atraumatic. Moist oral mucosal membranes  Eyes: Anicteric, PERRL  Neck: Supple, trachea midline  Lungs:  Clear to auscultation  Heart: Regular rate and rhythm  Abdomen:  Soft, nontender,   Extremities:  left BKA, right foot with cyanosis, cold to touch  Neurologic: Nonfocal, moving all four extremities  Skin: No lesions  Access: Left AVF    Basic Metabolic Panel: Recent Labs  Lab 08/12/18 0601  NA 141  K 5.1  CL 97*  CO2 30  GLUCOSE 198*  BUN 38*  CREATININE 6.87*  CALCIUM 8.0*    Liver Function Tests: Recent Labs  Lab 08/12/18 0601  AST 37  ALT 18  ALKPHOS 235*  BILITOT 0.6  PROT 7.7  ALBUMIN 3.9   No results for input(s): LIPASE, AMYLASE in the last 168 hours. No results for input(s): AMMONIA in the last 168 hours.  CBC: Recent Labs  Lab 08/12/18 0601  WBC 12.6*  NEUTROABS 8.3*  HGB 12.4*  HCT 40.9  MCV 100.0  PLT 359    Cardiac Enzymes: No results for input(s): CKTOTAL, CKMB, CKMBINDEX, TROPONINI in the last 168 hours.  BNP: Invalid input(s): POCBNP  CBG: No results for input(s): GLUCAP in the last  168 hours.  Microbiology: Results for orders placed or performed during the hospital encounter of 04/25/16  Aerobic/Anaerobic Culture (surgical/deep wound)     Status: None   Collection Time: 04/28/16  9:02 AM  Result Value Ref Range Status   Specimen Description WOUND RIGHT FOOT  Final   Special Requests POF VANCOMYCIN AND ZINACEF  Final   Gram Stain NO WBC SEEN RARE GRAM VARIABLE ROD   Final   Culture   Final    ABUNDANT ESCHERICHIA COLI Confirmed Extended Spectrum Beta-Lactamase Producer (ESBL) ABUNDANT PSEUDOMONAS AERUGINOSA NO ANAEROBES ISOLATED Performed at [facility]    Report Status 05/02/2016 FINAL  Final   Organism ID, Bacteria ESCHERICHIA COLI  Final   Organism ID, Bacteria PSEUDOMONAS AERUGINOSA  Final      Susceptibility   Escherichia coli - MIC*    AMPICILLIN >=32 RESISTANT Resistant     CEFAZOLIN >=64 RESISTANT Resistant     CEFEPIME >=64 RESISTANT Resistant     CEFTAZIDIME >=64 RESISTANT Resistant     CEFTRIAXONE >=64 RESISTANT Resistant     CIPROFLOXACIN >=4 RESISTANT Resistant     GENTAMICIN >=16 RESISTANT Resistant     IMIPENEM <=0.25 SENSITIVE Sensitive     TRIMETH/SULFA <=20 SENSITIVE Sensitive     AMPICILLIN/SULBACTAM >=32 RESISTANT Resistant     PIP/TAZO 8 SENSITIVE Sensitive     Extended ESBL POSITIVE Resistant     *  ABUNDANT ESCHERICHIA COLI   Pseudomonas aeruginosa - MIC*    CEFTAZIDIME 4 SENSITIVE Sensitive     CIPROFLOXACIN >=4 RESISTANT Resistant     GENTAMICIN 4 SENSITIVE Sensitive     IMIPENEM 2 SENSITIVE Sensitive     PIP/TAZO <=4 SENSITIVE Sensitive     CEFEPIME 8 SENSITIVE Sensitive     * ABUNDANT PSEUDOMONAS AERUGINOSA  MRSA PCR Screening     Status: None   Collection Time: 04/28/16 10:06 AM  Result Value Ref Range Status   MRSA by PCR NEGATIVE NEGATIVE Final    Comment:        The GeneXpert MRSA Assay (FDA approved for NASAL specimens only), is one component of a comprehensive MRSA colonization surveillance program. It  is not intended to diagnose MRSA infection nor to guide or monitor treatment for MRSA infections.     Coagulation Studies: Recent Labs    08/12/18 0631  LABPROT 12.7  INR 0.96    Urinalysis: No results for input(s): COLORURINE, LABSPEC, PHURINE, GLUCOSEU, HGBUR, BILIRUBINUR, KETONESUR, PROTEINUR, UROBILINOGEN, NITRITE, LEUKOCYTESUR in the last 72 hours.  Invalid input(s): APPERANCEUR    Imaging: No results found.   Medications:   . [START ON 08/13/2018] sodium chloride    . [START ON 08/13/2018]  ceFAZolin (ANCEF) IV    . heparin 1,600 Units/hr (08/12/18 0746)      Assessment/ Plan:  [name] is a 57 y.o. Hispanic male with end stage renal disease, peripheral vascular disease, hypertension, CVA, hyperlipidemia, diabetes mellitus type II who is admitted to [facility] on 08/12/2018 for Post op pain, fever  MWF [facility] 113kg Left AVF  1. End Stage Renal Disease: last hemodialysis treatment was yesterday. Resume MWF schedule. Dialysis for tomorrow.    2. Anemia of chronic kidney disease: hemoglobin 12.4  - EPO as outpatient. Would hold due to ischemia  3. Hypertension: mildly elevated. Patient takes midodrine before his dialysis treatments - home regimen of amlodipine  4. Secondary Hyperparathyroidism: outpatient labs from 10/14 show PTH elevated at 1066, calcium 9.5 and phosphorus 5.5 - sevelamer and calcium acetate with meals.  - cinacalcet on hemodialysis days.   5. Peripheral arterial disease: angiogram for tomorrow.  - Appreciate vascular input.  - Heparin gtt.    LOS: 0 [place] 10/29/20191:06 PM

## 2018-08-12 NOTE — ED Notes (Addendum)
Pt uprite on stretcher in exam room with no distress noted; Dr Lucky Cowboy performed right femoral cath on 10/10; pt reports since proced has had intermittent fevers, pain & coolness to right foot (pt noted to have all toes amputated on same foot with left BKA); has dialysis Mon-Wed-Fri (fistula in left arm); MD at bedside to obtain doppler pulse

## 2018-08-12 NOTE — Progress Notes (Signed)
ANTICOAGULATION CONSULT NOTE - Initial Consult  Pharmacy Consult for heparin drip Indication: claudication  No Known Allergies  Patient Measurements: Height: 5\' 7"  (170.2 cm) Weight: 250 lb (113.4 kg) IBW/kg (Calculated) : 66.1 Heparin Dosing Weight: 95 kg  Vital Signs: Temp: 98.4 F (36.9 C) (10/29 0556) BP: 113/66 (10/29 0620) Pulse Rate: 81 (10/29 0620)  Labs: Recent Labs    08/12/18 0601  HGB 12.4*  HCT 40.9  PLT 359  CREATININE 6.87*    Estimated Creatinine Clearance: 14.1 mL/min (A) (by C-G formula based on SCr of 6.87 mg/dL (H)).   Medical History: Past Medical History:  Diagnosis Date  . Anemia   . Atherosclerosis   . Chronic kidney disease   . Diabetes mellitus without complication ([place])   . Hypercholesteremia   . Hypertension   . Hypotension   . Stroke [facility])     Medications:  No anticoag in PTA meds  Assessment:  Goal of Therapy:  Heparin level 0.3-0.7 units/ml Monitor platelets by anticoagulation protocol: Yes   Plan:  5000 unit bolus and initial rate of 1600 units/hr. First heparin level 8 hours after start of infusion.  Fumiye Lubben S 08/12/2018,7:15 AM

## 2018-08-12 NOTE — H&P (Signed)
[place] [facility] [place] NAME: [name]    MR#:  [id number]  DATE OF BIRTH:  Jul 24, 1961  DATE OF ADMISSION:  08/12/2018  PRIMARY CARE PHYSICIAN: Alene Mires Elyse Jarvis, MD   REQUESTING/REFERRING PHYSICIAN: Darel Hong, MD  CHIEF COMPLAINT:   Chief Complaint  Patient presents with  . Post-op Problem    HISTORY OF PRESENT ILLNESS:  [name]  is a 57 y.o. male with a known history of PAD being admitted for potential arterial occlusion. He reports roughly 3 days of slowly progressive moderate to severe aching discomfort in his right lower extremity and the stump of his right foot. He had a right femoral stent placed by Dr. Lucky Cowboy on 10/10 for chronic claudication.  He takes Plavix and has not missed any doses.  His symptoms have worsened after surgery however for the past 2 or 3 days they become acutely worse.  He said he tried to go to clinic yesterday but there were no appointments available and tonight while he was asleep his pain became so severe that he presented to the emergency department.  Is worse with ambulation.  Somewhat improved when hanging his leg off the bed.  Nonradiating.Dr Lucky Cowboy recommends admission by Hospitalist and he will perform Angio later today/tomorrow. PAST MEDICAL HISTORY:   Past Medical History:  Diagnosis Date  . Anemia   . Atherosclerosis   . Chronic kidney disease   . Diabetes mellitus without complication ([place])   . Hypercholesteremia   . Hypertension   . Hypotension   . Stroke [facility])     PAST SURGICAL HISTORY:   Past Surgical History:  Procedure Laterality Date  . A/V FISTULAGRAM Left 01/09/2017   Procedure: A/V Fistulagram;  Surgeon: [name], MD;  Location: [place] CV LAB;  Service: Cardiovascular;  Laterality: Left;  . A/V FISTULAGRAM Left 06/06/2017   Procedure: A/V Fistulagram;  Surgeon: [name], MD;  Location: [place] CV LAB;  Service: Cardiovascular;  Laterality: Left;  .  A/V FISTULAGRAM Left 08/15/2017   Procedure: A/V Fistulagram;  Surgeon: [name], MD;  Location: [place] CV LAB;  Service: Cardiovascular;  Laterality: Left;  . A/V FISTULAGRAM Left 01/22/2018   Procedure: A/V FISTULAGRAM;  Surgeon: [name], MD;  Location: [place] CV LAB;  Service: Cardiovascular;  Laterality: Left;  . A/V SHUNT INTERVENTION N/A 06/06/2017   Procedure: A/V SHUNT INTERVENTION;  Surgeon: [name], MD;  Location: [place] CV LAB;  Service: Cardiovascular;  Laterality: N/A;  . AMPUTATION Right 04/25/2016   Procedure: TOE AMPUTATION;  Surgeon: Katha Cabal, MD;  Location: [facility] ORS;  Service: Vascular;  Laterality: Right;  . APPLICATION OF WOUND VAC Right 04/25/2016   Procedure: APPLICATION OF WOUND VAC;  Surgeon: Katha Cabal, MD;  Location: [facility] ORS;  Service: Vascular;  Laterality: Right;  . AV FISTULA PLACEMENT Left 2015  . CARDIAC CATHETERIZATION N/A 04/30/2016   Procedure: Left Heart Cath and Coronary Angiography;  Surgeon: Wellington Hampshire, MD;  Location: [place] CV LAB;  Service: Cardiovascular;  Laterality: N/A;  . CATARACT EXTRACTION, BILATERAL    . LEG AMPUTATION BELOW KNEE Left   . LOWER EXTREMITY ANGIOGRAPHY Right 07/24/2018   Procedure: LOWER EXTREMITY ANGIOGRAPHY;  Surgeon: [name], MD;  Location: [place] CV LAB;  Service: Cardiovascular;  Laterality: Right;  . PERIPHERAL VASCULAR CATHETERIZATION N/A 10/03/2015   Procedure: A/V Shuntogram/Fistulagram;  Surgeon: [name], MD;  Location: [facility]  INVASIVE CV LAB;  Service: Cardiovascular;  Laterality: N/A;  . PERIPHERAL VASCULAR CATHETERIZATION N/A 01/16/2016   Procedure: Abdominal Aortogram w/Lower Extremity;  Surgeon: [name], MD;  Location: [place] CV LAB;  Service: Cardiovascular;  Laterality: N/A;  . PERIPHERAL VASCULAR CATHETERIZATION  01/16/2016   Procedure: Lower Extremity Intervention;  Surgeon: [name], MD;  Location: [place] CV LAB;  Service:  Cardiovascular;;  . PERIPHERAL VASCULAR CATHETERIZATION Right 04/27/2016   Procedure: Lower Extremity Angiography;  Surgeon: Katha Cabal, MD;  Location: [place] CV LAB;  Service: Cardiovascular;  Laterality: Right;  . PERIPHERAL VASCULAR CATHETERIZATION  04/27/2016   Procedure: Lower Extremity Intervention;  Surgeon: Katha Cabal, MD;  Location: [place] CV LAB;  Service: Cardiovascular;;  . TRANSMETATARSAL AMPUTATION Right 04/28/2016   Procedure: TRANSMETATARSAL AMPUTATION;  Surgeon: Albertine Patricia, DPM;  Location: [facility] OR[facility];  Service: Podiatry;  Laterality: Right;  . WOUND DEBRIDEMENT Right 04/25/2016   Procedure: DEBRIDEMENT WOUND;  Surgeon: Katha Cabal, MD;  Location: [facility] OR[facility];  Service: Vascular;  Laterality: Right;    SOCIAL HISTORY:   Social History   Tobacco Use  . Smoking status: Never Smoker  . Smokeless tobacco: Never Used  Substance Use Topics  . Alcohol use: No    FAMILY HISTORY:   Family History  Problem Relation Age of Onset  . Diabetes Father   . Prostate cancer Father     DRUG ALLERGIES:  No Known Allergies  REVIEW OF SYSTEMS:   Review of Systems  Constitutional: Negative for diaphoresis, fever, malaise/fatigue and weight loss.  HENT: Negative for ear discharge, ear pain, hearing loss, nosebleeds, sore throat and tinnitus.   Eyes: Negative for blurred vision and pain.  Respiratory: Negative for cough, hemoptysis, shortness of breath and wheezing.   Cardiovascular: Negative for chest pain, palpitations, orthopnea and leg swelling.  Gastrointestinal: Negative for abdominal pain, blood in stool, constipation, diarrhea, heartburn, nausea and vomiting.  Genitourinary: Negative for dysuria, frequency and urgency.  Musculoskeletal: Negative for back pain and myalgias.       Rt LE pain/claudication symptoms  Skin: Negative for itching and rash.  Neurological: Negative for dizziness, tingling, tremors, focal weakness, seizures, weakness  and headaches.  Psychiatric/Behavioral: Negative for depression. The patient is not nervous/anxious.     MEDICATIONS AT HOME:   Prior to Admission medications   Medication Sig Start Date End Date Taking? Authorizing Provider  acetaminophen (TYLENOL) 325 MG tablet Take by mouth. 04/02/14   [provider]  amLODipine (NORVASC) 10 MG tablet Take 10 mg by mouth. 07/01/13   [provider]  amoxicillin-clavulanate (AUGMENTIN) 875-125 MG tablet Take 1 tablet by mouth 2 (two) times daily. Patient not taking: Reported on 01/22/2018 12/21/17   Sherrie George B, FNP  aspirin EC 81 MG EC tablet Take 1 tablet (81 mg total) by mouth daily. 05/02/16   Gladstone Lighter, MD  atorvastatin (LIPITOR) 20 MG tablet  06/11/16   [provider]  atorvastatin (LIPITOR) 40 MG tablet Take by mouth. 07/01/13   [provider]  b complex-vitamin c-folic acid (NEPHRO-VITE) 0.8 MG TABS tablet Take 1 tablet by mouth daily.    [provider]  bisacodyl (DULCOLAX) 5 MG EC tablet Take 20 mg by mouth daily as needed for moderate constipation.    [provider]  calcium acetate (PHOSLO) 667 MG capsule Take 2,001 mg by mouth 3 (three) times daily with meals. Take 667 mg with snacks    [provider]  calcium  carbonate (TUMS) 500 MG chewable tablet Chew by mouth.    [provider]  cinacalcet (SENSIPAR) 60 MG tablet Take 60 mg by mouth 3 (three) times daily.    [provider]  clopidogrel (PLAVIX) 75 MG tablet Take 1 tablet (75 mg total) by mouth daily. 07/24/18   [name], MD  epoetin alfa (PROCRIT) 30160 UNIT/ML injection Inject into the skin. 04/02/14   [provider]  ferrous sulfate 325 (65 FE) MG EC tablet Take 325 mg by mouth 3 (three) times daily with meals.    [provider]  glipiZIDE (GLUCOTROL) 10 MG tablet Take 10 mg by mouth daily.  10/25/16   [provider]  lidocaine-prilocaine (EMLA) cream Apply 1  application topically as needed (port access).    [provider]  midodrine (PROAMATINE) 10 MG tablet Take 10 mg by mouth daily on Mon, Tues, and Fri at dialysis    [provider]  omeprazole (PRILOSEC) 20 MG capsule Take 20 mg by mouth daily.    [provider]  polyethylene glycol (MIRALAX / GLYCOLAX) packet Take 17 g by mouth daily as needed for mild constipation.    [provider]  sevelamer carbonate (RENVELA) 800 MG tablet Take 800 mg by mouth 3 (three) times daily with meals.    [provider]      VITAL SIGNS:  Blood pressure 113/66, pulse 81, temperature 98.4 F (36.9 C), resp. rate 18, height 5\' 7"  (1.702 m), weight 113.4 kg, SpO2 97 %. PHYSICAL EXAMINATION:  Physical Exam  GENERAL:  57 y.o.-year-old patient lying in the bed with no acute distress.  EYES: Pupils equal, round, reactive to light and accommodation. No scleral icterus. Extraocular muscles intact.  HEENT: Head atraumatic, normocephalic. Oropharynx and nasopharynx clear.  NECK:  Supple, no jugular venous distention. No thyroid enlargement, no tenderness.  LUNGS: Normal breath sounds bilaterally, no wheezing, rales,rhonchi or crepitation. No use of accessory muscles of respiration.  CARDIOVASCULAR: S1, S2 normal. No murmurs, rubs, or gallops.  ABDOMEN: Soft, nontender, nondistended. Bowel sounds present. No organomegaly or mass.  EXTREMITIES:Forefoot amputation on the right. unable to palpate or Doppler DP or PT pulse.  There is a line of demarcation midway through his calf where above that it is quite warm and distally is significantly cool with prolonged capillary refill.  NEUROLOGIC: Cranial nerves II through XII are intact. Muscle strength 5/5 in all extremities. Sensation intact. Gait not checked.  PSYCHIATRIC: The patient is alert and oriented x 3.  SKIN: No obvious rash, lesion, or ulcer.  LABORATORY PANEL:   CBC Recent Labs  Lab 08/12/18 0601  WBC 12.6*  HGB  12.4*  HCT 40.9  PLT 359   ------------------------------------------------------------------------------------------------------------------  Chemistries  Recent Labs  Lab 08/12/18 0601  NA 141  K 5.1  CL 97*  CO2 30  GLUCOSE 198*  BUN 38*  CREATININE 6.87*  CALCIUM 8.0*  AST 37  ALT 18  ALKPHOS 235*  BILITOT 0.6    IMPRESSION AND PLAN:  6 Y M with Rt LE acute limb ischemia/occlusion  * Rt LE acute limb ischemia/occlusion - continue heparin drip - started in ED - Vascular surgery c/s - urgent - d/w Dr Lucky Cowboy - he's planning to do Angio later today/tomorrow - continue asa, plavix and statin  * ESRD on HD - c/s Nephro  * DM - Continue DM meds and c/s DM nurse coordinator  * HTN - monitor. Stable   All the records are reviewed  and case discussed with ED provider. Management plans discussed with the patient, family and they are in agreement.  CODE STATUS: FULL CODE  TOTAL TIME TAKING CARE OF THIS PATIENT: 45 minutes.    [name] M.D on 08/12/2018 at 7:51 AM  Between 7am to 6pm - Pager - [phone]  After 6pm go to [email or web address] - Proofreader  [facility] [place] [facility]  Office  [phone]  CC: Primary care physician; [name], MD   Note: This dictation was prepared with Dragon dictation along with smaller phrase technology. Any transcriptional errors that result from this process are unintentional.

## 2018-08-12 NOTE — ED Triage Notes (Addendum)
Information obtained via [employer], [place] [phone].  Patient with reports surgery at groin of right leg on 10/10 states since then has had pain, foot is cold and fevers.

## 2018-08-12 NOTE — ED Provider Notes (Signed)
[facility] Emergency Department Provider Note  ____________________________________________   First MD Initiated Contact with Patient 08/12/18 [phone]     (approximate)  I have reviewed the triage vital signs and the nursing notes.   HISTORY  Chief Complaint Post-op Problem   HPI [name] is a 57 y.o. male who self presents to the emergency department with roughly 3 days of slowly progressive moderate to severe aching discomfort in his right lower extremity and the stump of his right foot.  He has a complex past medical history including end-stage renal disease for which he receives hemodialysis Monday Wednesday Friday.  He also has a history of diabetes and chronic hypotension for which he takes Midrin.  19 days ago he had a right femoral stent placed by Dr. Lucky Cowboy for chronic claudication.  He takes Plavix and has not missed any doses.  His symptoms have worsened after surgery however for the past 2 or 3 days they become acutely worse.  He said he tried to go to clinic yesterday but there were no appointments available and tonight while he was asleep his pain became so severe that he presented to the emergency department.  Is worse with ambulation.  Somewhat improved when hanging his leg off the bed.  Nonradiating.          Past Medical History:  Diagnosis Date  . Anemia   . Atherosclerosis   . Chronic kidney disease   . Diabetes mellitus without complication ([place])   . Hypercholesteremia   . Hypertension   . Hypotension   . Stroke [facility])     Patient Active Problem List   Diagnosis Date Noted  . ESRD on dialysis ([place]) 11/09/2016  . Diabetes ([place]) 11/09/2016  . PVD (peripheral vascular disease) ([place]) 11/09/2016  . Cardiac arrest ([place]) 04/28/2016  . Atherosclerosis of extremity with gangrene ([place]) 04/25/2016  . Diabetic polyneuropathy associated with type 2 diabetes mellitus ([place]) 12/09/2015  . Diabetic ulcer of right foot associated with type 2  diabetes mellitus ([place]) 12/09/2015  . Anemia 03/24/2014  . Left foot burn 03/17/2014  . Leukocytosis 03/17/2014  . Obesity 03/17/2014  . PND (paroxysmal nocturnal dyspnea) 06/29/2013  . Volume overload 06/29/2013  . Diastolic congestive heart failure, NYHA class 2 ([place]) 06/28/2013  . Shortness of breath 06/28/2013  . After-cataract with vision obscured 08/01/2011  . Anisometropia 08/01/2011  . Background diabetic retinopathy ([place]) 08/01/2011  . Senile nuclear sclerosis 08/01/2011    Past Surgical History:  Procedure Laterality Date  . A/V FISTULAGRAM Left 01/09/2017   Procedure: A/V Fistulagram;  Surgeon: [name], MD;  Location: [place] [facility];  Service: Cardiovascular;  Laterality: Left;  . A/V FISTULAGRAM Left 06/06/2017   Procedure: A/V Fistulagram;  Surgeon: [name], MD;  Location: [place] [facility];  Service: Cardiovascular;  Laterality: Left;  . A/V FISTULAGRAM Left 08/15/2017   Procedure: A/V Fistulagram;  Surgeon: [name], MD;  Location: [place] [facility];  Service: Cardiovascular;  Laterality: Left;  . A/V FISTULAGRAM Left 01/22/2018   Procedure: A/V FISTULAGRAM;  Surgeon: [name], MD;  Location: [place] [facility];  Service: Cardiovascular;  Laterality: Left;  . A/V SHUNT INTERVENTION N/A 06/06/2017   Procedure: A/V SHUNT INTERVENTION;  Surgeon: [name], MD;  Location: [place] [facility];  Service: Cardiovascular;  Laterality: N/A;  . AMPUTATION Right 04/25/2016   Procedure: TOE AMPUTATION;  Surgeon: Katha Cabal, MD;  Location: [facility];  Service:  Vascular;  Laterality: Right;  . APPLICATION OF WOUND VAC Right 04/25/2016   Procedure: APPLICATION OF WOUND VAC;  Surgeon: Katha Cabal, MD;  Location: [facility];  Service: Vascular;  Laterality: Right;  . AV FISTULA PLACEMENT Left 2015  . CARDIAC CATHETERIZATION N/A 04/30/2016   Procedure: Left Heart Cath and Coronary Angiography;  Surgeon: Wellington Hampshire, MD;  Location: [place]  [facility];  Service: Cardiovascular;  Laterality: N/A;  . CATARACT EXTRACTION, BILATERAL    . LEG AMPUTATION BELOW KNEE Left   . LOWER EXTREMITY ANGIOGRAPHY Right 07/24/2018   Procedure: LOWER EXTREMITY ANGIOGRAPHY;  Surgeon: [name], MD;  Location: [place] [facility];  Service: Cardiovascular;  Laterality: Right;  . PERIPHERAL VASCULAR CATHETERIZATION N/A 10/03/2015   Procedure: A/V Shuntogram/Fistulagram;  Surgeon: [name], MD;  Location: [place] [facility];  Service: Cardiovascular;  Laterality: N/A;  . PERIPHERAL VASCULAR CATHETERIZATION N/A 01/16/2016   Procedure: Abdominal Aortogram w/Lower Extremity;  Surgeon: [name], MD;  Location: [place] [facility];  Service: Cardiovascular;  Laterality: N/A;  . PERIPHERAL VASCULAR CATHETERIZATION  01/16/2016   Procedure: Lower Extremity Intervention;  Surgeon: [name], MD;  Location: [place] [facility];  Service: Cardiovascular;;  . PERIPHERAL VASCULAR CATHETERIZATION Right 04/27/2016   Procedure: Lower Extremity Angiography;  Surgeon: Katha Cabal, MD;  Location: [place] [facility];  Service: Cardiovascular;  Laterality: Right;  . PERIPHERAL VASCULAR CATHETERIZATION  04/27/2016   Procedure: Lower Extremity Intervention;  Surgeon: Katha Cabal, MD;  Location: [place] [facility];  Service: Cardiovascular;;  . TRANSMETATARSAL AMPUTATION Right 04/28/2016   Procedure: TRANSMETATARSAL AMPUTATION;  Surgeon: Albertine Patricia, DPM;  Location: [facility];  Service: Podiatry;  Laterality: Right;  . WOUND DEBRIDEMENT Right 04/25/2016   Procedure: DEBRIDEMENT WOUND;  Surgeon: Katha Cabal, MD;  Location: [facility];  Service: Vascular;  Laterality: Right;    Prior to Admission medications   Medication Sig Start Date End Date Taking? Authorizing Provider  acetaminophen (TYLENOL) 325 MG tablet Take by mouth. 04/02/14   [provider]  amLODipine (NORVASC) 10 MG tablet Take 10 mg by mouth. 07/01/13   [provider]    amoxicillin-clavulanate (AUGMENTIN) 875-125 MG tablet Take 1 tablet by mouth 2 (two) times daily. Patient not taking: Reported on 01/22/2018 12/21/17   Sherrie George B, FNP  aspirin EC 81 MG EC tablet Take 1 tablet (81 mg total) by mouth daily. 05/02/16   Gladstone Lighter, MD  atorvastatin (LIPITOR) 20 MG tablet  06/11/16   [provider]  atorvastatin (LIPITOR) 40 MG tablet Take by mouth. 07/01/13   [provider]  b complex-vitamin c-folic acid (NEPHRO-VITE) 0.8 MG TABS tablet Take 1 tablet by mouth daily.    [provider]  bisacodyl (DULCOLAX) 5 MG EC tablet Take 20 mg by mouth daily as needed for moderate constipation.    [provider]  calcium acetate (PHOSLO) 667 MG capsule Take 2,001 mg by mouth 3 (three) times daily with meals. Take 667 mg with snacks    [provider]  calcium carbonate (TUMS) 500 MG chewable tablet Chew by mouth.    [provider]  cinacalcet (SENSIPAR) 60 MG tablet Take 60 mg by mouth 3 (three) times daily.    [provider]  clopidogrel (PLAVIX) 75 MG tablet Take 1 tablet (75 mg total) by mouth daily. 07/24/18   [name], MD  epoetin alfa (PROCRIT) 29562 UNIT/ML injection Inject into the skin.  04/02/14   [provider]  ferrous sulfate 325 (65 FE) MG EC tablet Take 325 mg by mouth 3 (three) times daily with meals.    [provider]  glipiZIDE (GLUCOTROL) 10 MG tablet Take 10 mg by mouth daily.  10/25/16   [provider]  lidocaine-prilocaine (EMLA) cream Apply 1 application topically as needed (port access).    [provider]  midodrine (PROAMATINE) 10 MG tablet Take 10 mg by mouth daily on Mon, Tues, and Fri at dialysis    [provider]  omeprazole (PRILOSEC) 20 MG capsule Take 20 mg by mouth daily.    [provider]  polyethylene glycol (MIRALAX / GLYCOLAX) packet Take 17 g by mouth daily as needed for mild constipation.    [provider]  sevelamer carbonate (RENVELA) 800 MG tablet Take 800 mg by mouth 3 (three) times daily with meals.    [provider]    Allergies Patient has no known allergies.  Family History  Problem Relation Age of Onset  . Diabetes Father   . Prostate cancer Father     Social History Social History   Tobacco Use  . Smoking status: Never Smoker  . Smokeless tobacco: Never Used  Substance Use Topics  . Alcohol use: No  . Drug use: No    Review of Systems Constitutional: No fever/chills Eyes: No visual changes. ENT: No sore throat. Cardiovascular: Denies chest pain. Respiratory: Denies shortness of breath. Gastrointestinal: No abdominal pain.  No nausea, no vomiting.  No diarrhea.  No constipation. Genitourinary: Negative for dysuria. Musculoskeletal: Positive for right leg pain Skin: Negative for rash. Neurological: Negative for headaches, focal weakness or numbness.   ____________________________________________   PHYSICAL EXAM:  VITAL SIGNS: ED Triage Vitals  Enc Vitals Group     BP 08/12/18 0556 127/70     Pulse Rate 08/12/18 0556 86     Resp 08/12/18 0556 16     Temp 08/12/18 0556 98.4 F (36.9 C)     Temp src --      SpO2 08/12/18 0556 99 %     Weight 08/12/18 0556 250 lb (113.4 kg)     Height 08/12/18 0556 5\' 7"  (1.702 m)     Head Circumference --      Peak Flow --      Pain Score 08/12/18 0557 10     Pain Loc --      Pain Edu? --      Excl. in [place]? --     Constitutional: Alert and oriented x4 appears obviously quite uncomfortable but nontoxic no diaphoresis Eyes: PERRL EOMI. Head: Atraumatic. Nose: No congestion/rhinnorhea. Mouth/Throat: No trismus Neck: No stridor.   Cardiovascular: Normal rate, regular rhythm. Grossly normal heart sounds.  Good peripheral circulation. Respiratory: Normal respiratory effort.  No retractions. 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  I am unable to palpate or Doppler DP or PT pulse.  There is a line of demarcation midway through his calf where above that it is quite warm and distally is significantly cool with prolonged capillary refill. Neurologic:  Normal speech and language. No gross focal neurologic deficits are appreciated. Skin:  Skin is warm, dry and intact. No rash noted. Psychiatric: Mood and affect are normal. Speech and behavior are normal.  Right upper extremity blood pressure 116/73 Right lower extremity blood pressure 67/55 ABI: 0.57   ____________________________________________   DIFFERENTIAL includes but not limited  to  Claudication, medication noncompliance, arterial thrombus, arterial embolus ____________________________________________   LABS (all labs ordered are listed, but only abnormal results are displayed)  Labs Reviewed  CBC WITH DIFFERENTIAL/PLATELET - Abnormal; Notable for the following components:      Result Value   WBC 12.6 (*)    RBC 4.09 (*)    Hemoglobin 12.4 (*)    RDW 15.7 (*)    Neutro Abs 8.3 (*)    Monocytes Absolute 1.4 (*)    Eosinophils Absolute 0.6 (*)    All other components within normal limits  COMPREHENSIVE METABOLIC PANEL - Abnormal; Notable for the following components:   Chloride 97 (*)    Glucose, Bld 198 (*)    BUN 38 (*)    Creatinine, Ser 6.87 (*)    Calcium 8.0 (*)    Alkaline Phosphatase 235 (*)    GFR calc non Af Amer 8 (*)    GFR calc Af Amer 9 (*)    All other components within normal limits  LACTIC ACID, PLASMA  LACTIC ACID, PLASMA  PROTIME-INR  APTT    Lab work reviewed by me with elevated white count which is nonspecific and likely secondary to pain Creatinine is consistent with chronic kidney disease and no indication for emergent  dialysis __________________________________________  EKG   ____________________________________________  RADIOLOGY   ____________________________________________   PROCEDURES  Procedure(s) performed: no  .Critical Care Performed by: Darel Hong, MD Authorized by: Darel Hong, MD   Critical care provider statement:    Critical care time (minutes):  30   Critical care time was exclusive of:  Separately billable procedures and treating other patients   Critical care was necessary to treat or prevent imminent or life-threatening deterioration of the following conditions: Acute limb threatening disease.   Critical care was time spent personally by me on the following activities:  Development of treatment plan with patient or surrogate, discussions with consultants, evaluation of patient's response to treatment, examination of patient, obtaining history from patient or surrogate, ordering and performing treatments and interventions, ordering and review of laboratory studies, ordering and review of radiographic studies, pulse oximetry, re-evaluation of patient's condition and review of old charts    Critical Care performed: Yes  ____________________________________________   INITIAL IMPRESSION / ASSESSMENT AND PLAN / ED COURSE  Pertinent labs & imaging results that were available during my care of the patient were reviewed by me and considered in my medical decision making (see chart for details).   As part of my medical decision making, I reviewed the following data within the [place] History obtained from family if available, nursing notes, old chart and ekg, as well as notes from prior ED visits.  Patient comes to the emergency department with subacute claudication symptoms.  He has a long-standing history of peripheral artery disease and I am unable to palpate a pulse nor can I Doppler a pulse.  His ABI is abnormal at 0.57.  At this point I have high  clinical suspicion for arterial occlusion.  I am placing him on a heparin infusion and I will reach out to Dr. Lucky Cowboy of vascular surgery.    ----------------------------------------- 6:39 AM on 08/12/2018 -----------------------------------------  I spoke with vascular surgeon Dr. Lucky Cowboy who recommends heparin infusion and admission to the hospitalist service he will perform an angiogram either later on today or tomorrow.  He recommends against CT angiogram as it would not be helpful so distal. ____________________________________________   FINAL CLINICAL IMPRESSION(S) / ED  DIAGNOSES  Final diagnoses:  Claudication [facility])  Critical lower limb ischemia      NEW MEDICATIONS STARTED DURING THIS VISIT:  New Prescriptions   No medications on file     Note:  This document was prepared using Dragon voice recognition software and may include unintentional dictation errors.     Darel Hong, MD 08/12/18 [phone]

## 2018-08-12 NOTE — ED Notes (Signed)
Shannon RN, aware of bed assigned  

## 2018-08-12 NOTE — Progress Notes (Signed)
Patient well-known to our service.  Had right lower extremity angiogram several weeks ago with extensive small vessel disease that was not really amenable to revascularization at that time.  Multiple level intervention was performed.  Returns with worsening pain in his right foot and lower leg with signs of poor perfusion.  Being admitted to the hospitalist on heparin.  Has known severe chronic disease and is extremely high risk for amputation which I discussed with his wife is the most likely outcome of the situation.  Multiple other medical comorbidities complicates the situation greatly.  We will perform an angiogram tomorrow although I think our options for revascularization may be limited.

## 2018-08-12 NOTE — ED Notes (Signed)
Patient with obvious temperature change between right thigh and right lower leg.

## 2018-08-12 NOTE — Progress Notes (Signed)
[facility] Daily Progress Note   Plan: Right lower extremity angiogram with possible intervention to assess the patient's anatomy / degree of peripheral artery disease - if appropriate possible repair versus diagnostic angiogram for amputation (below the knee amputation versus above-the-knee amputation) is planned for tomorrow with Dr. Lucky Cowboy  Will pre-op patient.   Marcelle Overlie PA-C 08/12/2018 9:21 AM

## 2018-08-13 ENCOUNTER — Encounter
Admission: EM | Disposition: A | Discharge status: Home / Self Care | Payer: Self-pay | Source: Home / Self Care | Attending: Internal Medicine

## 2018-08-13 DIAGNOSIS — I739 Peripheral vascular disease, unspecified: Secondary | ICD-10-CM

## 2018-08-13 DIAGNOSIS — I743 Embolism and thrombosis of arteries of the lower extremities: Secondary | ICD-10-CM

## 2018-08-13 HISTORY — PX: LOWER EXTREMITY ANGIOGRAPHY: CATH118251

## 2018-08-13 LAB — CBC
HEMATOCRIT: 37.8 % — AB (ref 39.0–52.0)
HEMOGLOBIN: 11.4 g/dL — AB (ref 13.0–17.0)
MCH: 29.9 pg (ref 26.0–34.0)
MCHC: 30.2 g/dL (ref 30.0–36.0)
MCV: 99.2 fL (ref 80.0–100.0)
NRBC: 0 % (ref 0.0–0.2)
Platelets: 330 10*3/uL (ref 150–400)
RBC: 3.81 MIL/uL — ABNORMAL LOW (ref 4.22–5.81)
RDW: 15.8 % — ABNORMAL HIGH (ref 11.5–15.5)
WBC: 10.4 10*3/uL (ref 4.0–10.5)

## 2018-08-13 LAB — RENAL FUNCTION PANEL
Albumin: 3.6 g/dL (ref 3.5–5.0)
Anion gap: 14 (ref 5–15)
BUN: 57 mg/dL — ABNORMAL HIGH (ref 6–20)
CHLORIDE: 98 mmol/L (ref 98–111)
CO2: 27 mmol/L (ref 22–32)
Calcium: 8.5 mg/dL — ABNORMAL LOW (ref 8.9–10.3)
Creatinine, Ser: 8.52 mg/dL — ABNORMAL HIGH (ref 0.61–1.24)
GFR, EST AFRICAN AMERICAN: 7 mL/min — AB (ref >60)
GFR, EST NON AFRICAN AMERICAN: 6 mL/min — AB (ref >60)
Glucose, Bld: 182 mg/dL — ABNORMAL HIGH (ref 70–99)
POTASSIUM: 5.4 mmol/L — AB (ref 3.5–5.1)
Phosphorus: 5.3 mg/dL — ABNORMAL HIGH (ref 2.5–4.6)
Sodium: 139 mmol/L (ref 135–145)

## 2018-08-13 LAB — GLUCOSE, CAPILLARY
GLUCOSE-CAPILLARY: 169 mg/dL — AB (ref 70–99)
GLUCOSE-CAPILLARY: 157 mg/dL — AB (ref 70–99)
GLUCOSE-CAPILLARY: 135 mg/dL — AB (ref 70–99)
Glucose-Capillary: 153 mg/dL — ABNORMAL HIGH (ref 70–99)
Glucose-Capillary: 161 mg/dL — ABNORMAL HIGH (ref 70–99)
Glucose-Capillary: 154 mg/dL — ABNORMAL HIGH (ref 70–99)

## 2018-08-13 LAB — DIFFERENTIAL
Basophils Absolute: 0.1 10*3/uL (ref 0.0–0.1)
Basophils Relative: 1 %
Eosinophils Absolute: 0.2 10*3/uL (ref 0.0–0.5)
Eosinophils Relative: 2 %
LYMPHS ABS: 2.1 10*3/uL (ref 0.7–4.0)
LYMPHS PCT: 20 %
Monocytes Absolute: 1.3 10*3/uL — ABNORMAL HIGH (ref 0.1–1.0)
Monocytes Relative: 12 %
NEUTROS ABS: 6.9 10*3/uL (ref 1.7–7.7)
Neutrophils Relative %: 65 %

## 2018-08-13 LAB — HEPARIN LEVEL (UNFRACTIONATED)
HEPARIN UNFRACTIONATED: 0.19 [IU]/mL — AB (ref 0.30–0.70)
Heparin Unfractionated: 0.16 IU/mL — ABNORMAL LOW (ref 0.30–0.70)

## 2018-08-13 LAB — MRSA PCR SCREENING: MRSA BY PCR: NEGATIVE

## 2018-08-13 LAB — HIV ANTIBODY (ROUTINE TESTING W REFLEX): HIV Screen 4th Generation wRfx: NONREACTIVE

## 2018-08-13 LAB — MAGNESIUM: Magnesium: 2.8 mg/dL — ABNORMAL HIGH (ref 1.7–2.4)

## 2018-08-13 LAB — APTT: APTT: 42 s — AB (ref 24–36)

## 2018-08-13 SURGERY — LOWER EXTREMITY ANGIOGRAPHY
Anesthesia: Moderate Sedation | Site: Leg Lower | Laterality: Right

## 2018-08-13 MED ORDER — HEPARIN (PORCINE) IN NACL 100-0.45 UNIT/ML-% IJ SOLN
INTRAMUSCULAR | Status: AC
  Filled 2018-08-13: qty 250

## 2018-08-13 MED ORDER — MIDODRINE HCL 5 MG PO TABS
5.0000 mg | ORAL_TABLET | ORAL | Status: DC
  Administered 2018-08-13: 5 mg via ORAL
  Filled 2018-08-13: qty 1

## 2018-08-13 MED ORDER — ALTEPLASE 2 MG IJ SOLR
INTRAMUSCULAR | Status: AC
  Filled 2018-08-13: qty 8

## 2018-08-13 MED ORDER — HYDROMORPHONE HCL 1 MG/ML IJ SOLN
0.5000 mg | INTRAMUSCULAR | Status: DC | PRN
  Administered 2018-08-13 – 2018-08-14 (×4): 1 mg via INTRAVENOUS
  Filled 2018-08-13 (×4): qty 1

## 2018-08-13 MED ORDER — LABETALOL HCL 5 MG/ML IV SOLN
INTRAVENOUS | Status: DC | PRN
  Administered 2018-08-13: 10 mg via INTRAVENOUS

## 2018-08-13 MED ORDER — INSULIN ASPART 100 UNIT/ML ~~LOC~~ SOLN
0.0000 [IU] | Freq: Every day | SUBCUTANEOUS | Status: DC
  Filled 2018-08-13: qty 0.05

## 2018-08-13 MED ORDER — INSULIN ASPART 100 UNIT/ML ~~LOC~~ SOLN
0.0000 [IU] | Freq: Three times a day (TID) | SUBCUTANEOUS | Status: DC
  Administered 2018-08-13 – 2018-08-14 (×2): 2 [IU] via SUBCUTANEOUS
  Filled 2018-08-13 (×2): qty 1
  Filled 2018-08-13: qty 0.09

## 2018-08-13 MED ORDER — KETOROLAC TROMETHAMINE 30 MG/ML IJ SOLN
30.0000 mg | Freq: Once | INTRAMUSCULAR | Status: AC
  Administered 2018-08-13: 30 mg via INTRAVENOUS

## 2018-08-13 MED ORDER — FENTANYL CITRATE (PF) 100 MCG/2ML IJ SOLN
INTRAMUSCULAR | Status: DC | PRN
  Administered 2018-08-13 (×4): 50 ug via INTRAVENOUS

## 2018-08-13 MED ORDER — HEPARIN SODIUM (PORCINE) 1000 UNIT/ML IJ SOLN
INTRAMUSCULAR | Status: AC
  Filled 2018-08-13: qty 1

## 2018-08-13 MED ORDER — FENTANYL CITRATE (PF) 100 MCG/2ML IJ SOLN
INTRAMUSCULAR | Status: AC
  Filled 2018-08-13: qty 2

## 2018-08-13 MED ORDER — MIDAZOLAM HCL 5 MG/5ML IJ SOLN
INTRAMUSCULAR | Status: AC
  Filled 2018-08-13: qty 5

## 2018-08-13 MED ORDER — HYDROMORPHONE HCL 1 MG/ML IJ SOLN
INTRAMUSCULAR | Status: AC
  Administered 2018-08-13: 1 mg
  Filled 2018-08-13: qty 1

## 2018-08-13 MED ORDER — LABETALOL HCL 5 MG/ML IV SOLN
INTRAVENOUS | Status: AC
  Filled 2018-08-13: qty 4

## 2018-08-13 MED ORDER — IOPAMIDOL (ISOVUE-300) INJECTION 61%
INTRAVENOUS | Status: DC | PRN
  Administered 2018-08-13: 60 mL via INTRA_ARTERIAL

## 2018-08-13 MED ORDER — ALTEPLASE 2 MG IJ SOLR
INTRAMUSCULAR | Status: DC | PRN
  Administered 2018-08-13: 8 mg

## 2018-08-13 MED ORDER — LIDOCAINE-EPINEPHRINE (PF) 1 %-1:200000 IJ SOLN
INTRAMUSCULAR | Status: AC
  Filled 2018-08-13: qty 30

## 2018-08-13 MED ORDER — KETOROLAC TROMETHAMINE 60 MG/2ML IM SOLN
INTRAMUSCULAR | Status: AC
  Filled 2018-08-13: qty 2

## 2018-08-13 MED ORDER — HEPARIN SODIUM (PORCINE) 1000 UNIT/ML IJ SOLN
INTRAMUSCULAR | Status: DC | PRN
  Administered 2018-08-13: 5000 [IU] via INTRAVENOUS

## 2018-08-13 MED ORDER — HEPARIN (PORCINE) IN NACL 1000-0.9 UT/500ML-% IV SOLN
INTRAVENOUS | Status: AC
  Filled 2018-08-13: qty 1000

## 2018-08-13 MED ORDER — MIDAZOLAM HCL 2 MG/2ML IJ SOLN
INTRAMUSCULAR | Status: DC | PRN
  Administered 2018-08-13 (×2): 2 mg via INTRAVENOUS
  Administered 2018-08-13: 1 mg via INTRAVENOUS

## 2018-08-13 SURGICAL SUPPLY — 19 items
BALLN LUTONIX 018 5X220X130 (BALLOONS) ×3
BALLN LUTONIX DCB 4X100X130 (BALLOONS) ×3
BALLN ULTRVRSE 2.5X220X150 (BALLOONS) ×3
BALLOON LUTONIX 018 5X220X130 (BALLOONS) ×1 IMPLANT
BALLOON LUTONIX DCB 4X100X130 (BALLOONS) ×1 IMPLANT
BALLOON ULTRVRSE 2.5X220X150 (BALLOONS) ×1 IMPLANT
CANISTER PENUMBRA ENGINE (MISCELLANEOUS) ×3 IMPLANT
CATH BEACON 5 .038 100 VERT TP (CATHETERS) ×3 IMPLANT
CATH INDIGO CAT6 KIT (CATHETERS) ×3 IMPLANT
CATH PIG 70CM (CATHETERS) ×3 IMPLANT
DEVICE PRESTO INFLATION (MISCELLANEOUS) ×3 IMPLANT
DEVICE STARCLOSE SE CLOSURE (Vascular Products) ×3 IMPLANT
GLIDEWIRE ADV .035X260CM (WIRE) ×3 IMPLANT
PACK ANGIOGRAPHY (CUSTOM PROCEDURE TRAY) ×3 IMPLANT
SHEATH BRITE TIP 5FRX11 (SHEATH) ×3 IMPLANT
SHEATH PINNACLE ST 6F 45CM (SHEATH) ×3 IMPLANT
TUBING CONTRAST HIGH PRESS 72 (TUBING) ×3 IMPLANT
WIRE G V18X300CM (WIRE) ×3 IMPLANT
WIRE J 3MM .035X145CM (WIRE) ×3 IMPLANT

## 2018-08-13 NOTE — Progress Notes (Signed)
Pre HD assessment    08/13/18 0916  Vital Signs  Temp 98.1 F (36.7 C)  Temp Source Oral  Pulse Rate (!) 101  Pulse Rate Source Monitor  Resp 10  BP (!) 169/98  BP Location Right Arm  BP Method Automatic  Patient Position (if appropriate) Lying  Oxygen Therapy  SpO2 94 %  O2 Device Room Air  Pain Assessment  Pain Scale 0-10  Pain Score 0  Dialysis Weight  Weight 118.1 kg  Type of Weight Pre-Dialysis  Time-Out for Hemodialysis  What Procedure? HD  Pt Identifiers(min of two) First/Last Name;MRN/Account#  Correct Site? Yes  Correct Side? Yes  Correct Procedure? Yes  Consents Verified? Yes  Rad Studies Available? N/A  Safety Precautions Reviewed? Yes  Engineer, civil (consulting) Number  ([id number])  Station Number 4  UF/Alarm Test Passed  Conductivity: Meter 13.8  Conductivity: Machine  13.9  pH 7.6  Reverse Osmosis main  Normal Saline Lot Number [id number]  Dialyzer Lot Number [id number]  Disposable Set Lot Number [id number]  Machine Temperature 98.6 F (37 C)  Musician and Audible Yes  Blood Lines Intact and Secured Yes  Pre Treatment Patient Checks  Vascular access used during treatment Fistula  Hepatitis B Surface Antigen Results Negative (unk )  Date Hepatitis B Surface Antigen Drawn 01/11/18  Hepatitis B Surface Antibody  (>10/unk )  Date Hepatitis B Surface Antibody Drawn 01/11/18  Hemodialysis Consent Verified Yes  Hemodialysis Standing Orders Initiated Yes  ECG (Telemetry) Monitor On Yes  Prime Ordered Normal Saline  Length of  DialysisTreatment -hour(s) 3 Hour(s)  Dialyzer Elisio 17H NR  Dialysate 2K, 2.5 Ca  Dialysis Anticoagulant None  Dialysate Flow Ordered 600  Blood Flow Rate Ordered 400 mL/min  Ultrafiltration Goal 1.5 Liters  Pre Treatment Labs Hepatitis B Surface Antigen (HBSAB, Heparin level )  Dialysis Blood Pressure Support Ordered Normal Saline  Education / Care Plan  Dialysis Education Provided Yes  Documented Education in Care Plan Yes   Fistula / Graft Left Forearm Arteriovenous fistula  No Placement Date or Time found.   Placed prior to admission: Yes  Orientation: Left  Access Location: Forearm  Access Type: Arteriovenous fistula  Site Condition No complications  Fistula / Graft Assessment Present;Thrill;Bruit  Drainage Description None

## 2018-08-13 NOTE — Progress Notes (Signed)
[place] [facility] [place]: [name]    MR#:  [id number]  DATE OF BIRTH:  1961/06/21  SUBJECTIVE:  CHIEF COMPLAINT:   Chief Complaint  Patient presents with  . Post-op Problem  no complaints, waiting for angio REVIEW OF SYSTEMS:  Review of Systems  Constitutional: Negative for chills, fever and weight loss.  HENT: Negative for nosebleeds and sore throat.   Eyes: Negative for blurred vision.  Respiratory: Negative for cough, shortness of breath and wheezing.   Cardiovascular: Negative for chest pain, orthopnea, leg swelling and PND.  Gastrointestinal: Negative for abdominal pain, constipation, diarrhea, heartburn, nausea and vomiting.  Genitourinary: Negative for dysuria and urgency.  Musculoskeletal: Negative for back pain.       Rt leg pain  Skin: Negative for rash.  Neurological: Negative for dizziness, speech change, focal weakness and headaches.  Endo/Heme/Allergies: Does not bruise/bleed easily.  Psychiatric/Behavioral: Negative for depression.    DRUG ALLERGIES:  No Known Allergies VITALS:  Blood pressure (!) 155/56, pulse 84, temperature 98.1 F (36.7 C), temperature source Oral, resp. rate 17, height 5\' 7"  (1.702 m), weight 118.1 kg, SpO2 97 %. PHYSICAL EXAMINATION:  Physical Exam  Constitutional: He is oriented to person, place, and time.  HENT:  Head: Normocephalic and atraumatic.  Eyes: Pupils are equal, round, and reactive to light. Conjunctivae and EOM are normal.  Neck: Normal range of motion. Neck supple. No tracheal deviation present. No thyromegaly present.  Cardiovascular: Normal rate, regular rhythm and normal heart sounds.  Pulmonary/Chest: Effort normal and breath sounds normal. No respiratory distress. He has no wheezes. He exhibits no tenderness.  Abdominal: Soft. Bowel sounds are normal. He exhibits no distension. There is no tenderness.  Musculoskeletal: Normal range of motion.  Forefoot amputation  on the right. unable to palpate or Doppler DP or PT pulse.  There is a line of demarcation midway through his calf where above that it is quite warm and distally is significantly cool with prolonged capillary refill.   Neurological: He is alert and oriented to person, place, and time. No cranial nerve deficit.  Skin: Skin is warm and dry. No rash noted.   LABORATORY PANEL:  Male CBC Recent Labs  Lab 08/13/18 0247  WBC 10.4  HGB 11.4*  HCT 37.8*  PLT 330   ------------------------------------------------------------------------------------------------------------------ Chemistries  Recent Labs  Lab 08/12/18 0601 08/13/18 0247  NA 141 139  K 5.1 5.4*  CL 97* 98  CO2 30 27  GLUCOSE 198* 182*  BUN 38* 57*  CREATININE 6.87* 8.52*  CALCIUM 8.0* 8.5*  MG  --  2.8*  AST 37  --   ALT 18  --   ALKPHOS 235*  --   BILITOT 0.6  --    RADIOLOGY:  No results found. ASSESSMENT AND PLAN:  89 Y M with Rt LE acute limb ischemia/occlusion  * Rt LE acute limb ischemia/occlusion - continue heparin drip  - Vascular surgery planning to do Angio later today - may need amputation - continue asa, plavix and statin  * ESRD on HD - Nephro following  * DM - Continue DM meds  * HTN - monitor. Stable     All the records are reviewed and case discussed with Care Management/Social Worker. Management plans discussed with the patient, nursing and they are in agreement.  CODE STATUS: Full Code  TOTAL TIME TAKING CARE OF THIS PATIENT: 15 minutes.   More than 50% of the time was spent  in counseling/coordination of care: YES  POSSIBLE D/C IN 2-3 DAYS, DEPENDING ON CLINICAL CONDITION. And vascular eval   [name] M.D on 08/13/2018 at 11:40 AM  Between 7am to 6pm - Pager - [phone]  After 6pm go to [email or web address] - Proofreader  [facility]  Office  [phone]  CC: Primary care physician; [name], MD  Note: This  dictation was prepared with Dragon dictation along with smaller phrase technology. Any transcriptional errors that result from this process are unintentional.

## 2018-08-13 NOTE — Progress Notes (Signed)
Post HD assessment    08/13/18 1239  Neurological  Level of Consciousness Alert  Orientation Level Oriented X4  Respiratory  Respiratory Pattern Regular;Unlabored  Chest Assessment Chest expansion symmetrical  Cardiac  Pulse Irregular  ECG Monitor Yes  Cardiac Rhythm SB;Atrial fibrillation  Vascular  R Radial Pulse +2  L Radial Pulse +2  Edema Generalized  Integumentary  Integumentary (WDL) X  Skin Color Appropriate for ethnicity  Musculoskeletal  Musculoskeletal (WDL) X  Generalized Weakness Yes  Assistive Device None  GU Assessment  Genitourinary (WDL) X  Genitourinary Symptoms  (HD)  Psychosocial  Psychosocial (WDL) WDL

## 2018-08-13 NOTE — Progress Notes (Signed)
Inpatient Diabetes Program Recommendations  AACE/ADA: New Consensus Statement on Inpatient Glycemic Control (2015)  Target Ranges:  Prepandial:   less than 140 mg/dL      Peak postprandial:   less than 180 mg/dL (1-2 hours)      Critically ill patients:  140 - 180 mg/dL    Admit with: Rt LE acute limb ischemia/occlusion  History: DM, ESRD  Home DM Meds: Glipizide 10 mg Daily  Current Orders: Glipizide 10 mg Daily     MD- Patient currently only has orders for Glipizide 10 mg Daily + One CBG check QAM  Please consider placing orders for Novolog Sensitive Correction Scale/ SSI (0-9 units) TID AC + HS      --Will follow patient during hospitalization--  Wyn Quaker RN, MSN, CDE Diabetes Coordinator Inpatient Glycemic Control Team Team Pager: [phone] (8a-5p)

## 2018-08-13 NOTE — Progress Notes (Signed)
Post HD assessment. Pt tolerated tx well without complication. Net UF 1513, goal met.    08/13/18 1240  Vital Signs  Temp 98.7 F (37.1 C)  Temp Source Oral  Pulse Rate 82  Pulse Rate Source Monitor  Resp 15  BP (!) 132/113  BP Location Right Arm  BP Method Automatic  Patient Position (if appropriate) Lying  Oxygen Therapy  SpO2 93 %  O2 Device Room Air  Dialysis Weight  Weight 115.7 kg  Type of Weight Post-Dialysis  Post-Hemodialysis Assessment  Rinseback Volume (mL) 250 mL  KECN 66.9 V  Dialyzer Clearance Lightly streaked  Duration of HD Treatment -hour(s) 3 hour(s)  Hemodialysis Intake (mL) 500 mL  UF Total -Machine (mL) 2013 mL  Net UF (mL) 1513 mL  Tolerated HD Treatment Yes  AVG/AVF Arterial Site Held (minutes) 10 minutes  AVG/AVF Venous Site Held (minutes) 10 minutes  Education / Care Plan  Dialysis Education Provided Yes  Documented Education in Care Plan Yes  Fistula / Graft Left Forearm Arteriovenous fistula  No Placement Date or Time found.   Placed prior to admission: Yes  Orientation: Left  Access Location: Forearm  Access Type: Arteriovenous fistula  Site Condition No complications  Fistula / Graft Assessment Present;Thrill;Bruit  Status Deaccessed  Drainage Description None

## 2018-08-13 NOTE — Progress Notes (Signed)
ANTICOAGULATION CONSULT NOTE - Initial Consult  Pharmacy Consult for heparin drip Indication: claudication  No Known Allergies  Patient Measurements: Height: 5\' 7"  (170.2 cm) Weight: 254 lb 9.6 oz (115.5 kg) IBW/kg (Calculated) : 66.1 Heparin Dosing Weight: 95 kg  Vital Signs: Temp: 98.5 F (36.9 C) (10/30 0339) Temp Source: Oral (10/30 0339) BP: 162/72 (10/30 0339) Pulse Rate: 81 (10/30 0339)  Labs: Recent Labs    08/12/18 0601 08/12/18 0631 08/12/18 1727 08/13/18 0247  HGB 12.4*  --   --  11.4*  HCT 40.9  --   --  37.8*  PLT 359  --   --  330  APTT  --   --   --  42*  LABPROT  --  12.7  --   --   INR  --  0.96  --   --   HEPARINUNFRC  --   --  0.82* 0.16*  CREATININE 6.87*  --   --   --     Estimated Creatinine Clearance: 14.2 mL/min (A) (by C-G formula based on SCr of 6.87 mg/dL (H)).   Medical History: Past Medical History:  Diagnosis Date  . Anemia   . Atherosclerosis   . Chronic kidney disease   . Diabetes mellitus without complication ([place])   . Hypercholesteremia   . Hypertension   . Hypotension   . Stroke [facility])     Medications:  No anticoag in PTA meds  Assessment:  HEPARIN COURSE DATE TIME HL CHANGE 1029 0746 -- Start heparin - 5000 units bolus and initial rate 1600 units/hr 1029 1727 0.82 Decrease to 1400 units/hr  Goal of Therapy:  Heparin level 0.3-0.7 units/ml Monitor platelets by anticoagulation protocol: Yes   Plan:  Supratherapeutic HL. Decrease to 1400 units and recheck HL in 8 hours.  10/30 AM heparin level 0.16. Running when drawn per RN. Will increase to 1500 units/hr and recheck in 8 hours.  Luka Stohr S, Pharm.D., BCPS Clinical Pharmacist 08/13/2018,6:18 AM

## 2018-08-13 NOTE — Progress Notes (Signed)
HD tx end    08/13/18 1232  Vital Signs  Pulse Rate 80  Pulse Rate Source Monitor  Resp 14  BP 123/79  BP Location Right Arm  BP Method Automatic  Patient Position (if appropriate) Lying  Oxygen Therapy  SpO2 94 %  O2 Device Room Air  During Hemodialysis Assessment  Dialysis Fluid Bolus Normal Saline  Bolus Amount (mL) 250 mL  Intra-Hemodialysis Comments Tx completed

## 2018-08-13 NOTE — Progress Notes (Signed)
Pt off the floor for angiogram

## 2018-08-13 NOTE — Progress Notes (Signed)
Pre HD assessment    08/13/18 0917  Neurological  Level of Consciousness Alert  Orientation Level Oriented X4  Respiratory  Respiratory Pattern Regular;Unlabored  Chest Assessment Chest expansion symmetrical  Cardiac  Pulse Irregular  ECG Monitor Yes  Cardiac Rhythm ST;Atrial fibrillation  Vascular  R Radial Pulse +2  L Radial Pulse +2  Edema Generalized  Integumentary  Integumentary (WDL) X  Skin Color Appropriate for ethnicity  Musculoskeletal  Musculoskeletal (WDL) X  Generalized Weakness Yes  Assistive Device None  GU Assessment  Genitourinary (WDL) X  Genitourinary Symptoms  (HD)  Psychosocial  Psychosocial (WDL) WDL

## 2018-08-13 NOTE — Progress Notes (Signed)
Pt off the floor for hemodialysis.  

## 2018-08-13 NOTE — Progress Notes (Signed)
ANTICOAGULATION CONSULT NOTE - Initial Consult  Pharmacy Consult for heparin drip Indication: claudication  No Known Allergies  Patient Measurements: Height: 5\' 7"  (170.2 cm) Weight: 253 lb 8.5 oz (115 kg) IBW/kg (Calculated) : 66.1 Heparin Dosing Weight: 95 kg  Vital Signs: Temp: 98.7 F (37.1 C) (10/30 1448) Temp Source: Oral (10/30 1448) BP: 188/95 (10/30 1637) Pulse Rate: 89 (10/30 1448)  Labs: Recent Labs    08/12/18 0601 08/12/18 0631 08/12/18 1727 08/13/18 0247 08/13/18 1434  HGB 12.4*  --   --  11.4*  --   HCT 40.9  --   --  37.8*  --   PLT 359  --   --  330  --   APTT  --   --   --  42*  --   LABPROT  --  12.7  --   --   --   INR  --  0.96  --   --   --   HEPARINUNFRC  --   --  0.82* 0.16* 0.19*  CREATININE 6.87*  --   --  8.52*  --     Estimated Creatinine Clearance: 11.5 mL/min (A) (by C-G formula based on SCr of 8.52 mg/dL (H)).   Medical History: Past Medical History:  Diagnosis Date  . Anemia   . Atherosclerosis   . Chronic kidney disease   . Diabetes mellitus without complication ([place])   . Hypercholesteremia   . Hypertension   . Hypotension   . Stroke [facility])     Medications:  No anticoag in PTA meds  Assessment:  HEPARIN COURSE DATE TIME HL CHANGE 1029 0746 -- Start heparin - 5000 units bolus and initial rate 1600 units/hr 1029 1727 0.82 Decrease to 1400 units/hr 1030    0247    0.16     Increase to 1500 units/hr 1030    1434    0.19       Goal of Therapy:  Heparin level 0.3-0.7 units/ml Monitor platelets by anticoagulation protocol: Yes   Plan:  10/30 16:44 spoke with Caryl Pina RN in specials recovery. She is restarting UFH infusion now. I told her level was low this afternoon so we should increase to 1750 units/hr. Dr. Lucky Cowboy requested no bolus, but resume post cath. Will start at 1750 units/hr and check HL in 8 hours.  Gretna Bergin A. Jordan Hawks, PharmD, BCPS 08/13/2018 4:44 PM

## 2018-08-13 NOTE — Progress Notes (Signed)
[facility]  ROUNDING NOTE   Subjective:   Complains of pain right lower extremity.   Seen and examined on hemodialysis. Tolerating procedure well.   Angiogram for later this afternoon  Objective:  Vital signs in last 24 hours:  Temp:  [98.2 F (36.8 C)-98.8 F (37.1 C)] 98.3 F (36.8 C) (10/30 0752) Pulse Rate:  [81-88] 84 (10/30 0752) Resp:  [16-17] 17 (10/30 0339) BP: (91-163)/(45-98) 163/73 (10/30 0752) SpO2:  [93 %-98 %] 93 % (10/30 0752) Weight:  [115.5 kg-115.9 kg] 115.5 kg (10/30 0413)  Weight change: 2.495 kg Filed Weights   08/12/18 0556 08/12/18 2310 08/13/18 0413  Weight: 113.4 kg 115.9 kg 115.5 kg    Intake/Output: I/O last 3 completed shifts: In: 509.8 [I.V.:509.8] Out: -    Intake/Output this shift:  No intake/output data recorded.  Physical Exam: General: NAD,   Head: Normocephalic, atraumatic. Moist oral mucosal membranes  Eyes: Anicteric, PERRL  Neck: Supple, trachea midline  Lungs:  Clear to auscultation  Heart: Regular rate and rhythm  Abdomen:  Soft, nontender,   Extremities:  left BKA, right foot with cyanosis, cold to touch  Neurologic: Nonfocal, moving all four extremities  Skin: No lesions  Access: Left AVF    Basic Metabolic Panel: Recent Labs  Lab 08/12/18 0601 08/13/18 0247  NA 141 139  K 5.1 5.4*  CL 97* 98  CO2 30 27  GLUCOSE 198* 182*  BUN 38* 57*  CREATININE 6.87* 8.52*  CALCIUM 8.0* 8.5*  MG  --  2.8*  PHOS  --  5.3*    Liver Function Tests: Recent Labs  Lab 08/12/18 0601 08/13/18 0247  AST 37  --   ALT 18  --   ALKPHOS 235*  --   BILITOT 0.6  --   PROT 7.7  --   ALBUMIN 3.9 3.6   No results for input(s): LIPASE, AMYLASE in the last 168 hours. No results for input(s): AMMONIA in the last 168 hours.  CBC: Recent Labs  Lab 08/12/18 0601 08/13/18 0247  WBC 12.6* 10.4  NEUTROABS 8.3* 6.9  HGB 12.4* 11.4*  HCT 40.9 37.8*  MCV 100.0 99.2  PLT 359 330    Cardiac Enzymes: No results  for input(s): CKTOTAL, CKMB, CKMBINDEX, TROPONINI in the last 168 hours.  BNP: Invalid input(s): POCBNP  CBG: Recent Labs  Lab 08/13/18 0749  GLUCAP 169*    Microbiology: Results for orders placed or performed during the hospital encounter of 04/25/16  Aerobic/Anaerobic Culture (surgical/deep wound)     Status: None   Collection Time: 04/28/16  9:02 AM  Result Value Ref Range Status   Specimen Description WOUND RIGHT FOOT  Final   Special Requests POF VANCOMYCIN AND ZINACEF  Final   Gram Stain NO WBC SEEN RARE GRAM VARIABLE ROD   Final   Culture   Final    ABUNDANT ESCHERICHIA COLI Confirmed Extended Spectrum Beta-Lactamase Producer (ESBL) ABUNDANT PSEUDOMONAS AERUGINOSA NO ANAEROBES ISOLATED Performed at [facility]    Report Status 05/02/2016 FINAL  Final   Organism ID, Bacteria ESCHERICHIA COLI  Final   Organism ID, Bacteria PSEUDOMONAS AERUGINOSA  Final      Susceptibility   Escherichia coli - MIC*    AMPICILLIN >=32 RESISTANT Resistant     CEFAZOLIN >=64 RESISTANT Resistant     CEFEPIME >=64 RESISTANT Resistant     CEFTAZIDIME >=64 RESISTANT Resistant     CEFTRIAXONE >=64 RESISTANT Resistant     CIPROFLOXACIN >=4 RESISTANT Resistant  GENTAMICIN >=16 RESISTANT Resistant     IMIPENEM <=0.25 SENSITIVE Sensitive     TRIMETH/SULFA <=20 SENSITIVE Sensitive     AMPICILLIN/SULBACTAM >=32 RESISTANT Resistant     PIP/TAZO 8 SENSITIVE Sensitive     Extended ESBL POSITIVE Resistant     * ABUNDANT ESCHERICHIA COLI   Pseudomonas aeruginosa - MIC*    CEFTAZIDIME 4 SENSITIVE Sensitive     CIPROFLOXACIN >=4 RESISTANT Resistant     GENTAMICIN 4 SENSITIVE Sensitive     IMIPENEM 2 SENSITIVE Sensitive     PIP/TAZO <=4 SENSITIVE Sensitive     CEFEPIME 8 SENSITIVE Sensitive     * ABUNDANT PSEUDOMONAS AERUGINOSA  MRSA PCR Screening     Status: None   Collection Time: 04/28/16 10:06 AM  Result Value Ref Range Status   MRSA by PCR NEGATIVE NEGATIVE Final    Comment:         The GeneXpert MRSA Assay (FDA approved for NASAL specimens only), is one component of a comprehensive MRSA colonization surveillance program. It is not intended to diagnose MRSA infection nor to guide or monitor treatment for MRSA infections.     Coagulation Studies: Recent Labs    08/12/18 0631  LABPROT 12.7  INR 0.96    Urinalysis: No results for input(s): COLORURINE, LABSPEC, PHURINE, GLUCOSEU, HGBUR, BILIRUBINUR, KETONESUR, PROTEINUR, UROBILINOGEN, NITRITE, LEUKOCYTESUR in the last 72 hours.  Invalid input(s): APPERANCEUR    Imaging: No results found.   Medications:   . sodium chloride    . sodium chloride 50 mL/hr at 08/13/18 0400  .  ceFAZolin (ANCEF) IV    . heparin 1,500 Units/hr (08/13/18 9892)   . aspirin EC  81 mg Oral Daily  . atorvastatin  20 mg Oral q1800  . cinacalcet  60 mg Oral TID  . clopidogrel  75 mg Oral Daily  . docusate sodium  100 mg Oral BID  . ferrous sulfate  325 mg Oral TID WC  . glipiZIDE  10 mg Oral Daily  . multivitamin  1 tablet Oral QHS  . pantoprazole  40 mg Oral Daily  . sevelamer carbonate  800 mg Oral TID WC  . sodium chloride flush  3 mL Intravenous Q12H     Assessment/ Plan:  [name] is a 57 y.o. Hispanic male with end stage renal disease, peripheral vascular disease, hypertension, CVA, hyperlipidemia, diabetes mellitus type II who is admitted to [facility] on 08/12/2018 for Claudication [facility]) [I73.9] PAD (peripheral artery disease) ([place]) [I73.9] Critical lower limb ischemia [I99.8]  MWF [facility] 113kg Left AVF  1. End Stage Renal Disease: seen and examined on hemodialysis.   2. Anemia of chronic kidney disease: hemoglobin 11.4  - EPO as outpatient. Would hold due to ischemia  3. Hypertension: mildly elevated. Patient takes midodrine before his dialysis treatments - home regimen of amlodipine  4. Secondary Hyperparathyroidism: outpatient labs from 10/14 show PTH elevated at 1066, calcium  9.5 and phosphorus 5.5 - sevelamer and calcium acetate with meals.  - cinacalcet on hemodialysis days.   5. Peripheral arterial disease: angiogram for today.  - Appreciate vascular input.  - Heparin gtt.    LOS: 1 [name] 10/30/20198:39 AM

## 2018-08-13 NOTE — Op Note (Signed)
[facility]  Percutaneous Study/Intervention Procedural Note   Date of Surgery: 08/13/2018  Surgeon(s):Temitope Griffing    Assistants:none  Pre-operative Diagnosis: PAD with rest pain and early gangrenous changes  Post-operative diagnosis:  Same  Procedure(s) Performed:             1.  Ultrasound guidance for vascular access left femoral artery             2.  Catheter placement into right SFA and profunda femorus arteries from left femoral approach             3.  Aortogram and selective right lower extremity angiogram             4.  catheter directed thrombolytic therapy with 8 mg of tpa to the right SFA and popliteal artery             5.   Mechanical thrombectomy with the penumbra cat 6 device to the right SFA, popliteal artery, tibioperoneal trunk, and profunda femoris arteries  6.  Percutaneous transluminal angioplasty of the right peroneal artery and tibioperoneal trunk with 2.5 mm diameter 22 cm length angioplasty balloon  7.  Percutaneous transluminal angioplasty of the entire right SFA and popliteal arteries with 2 inflations with a 5 mm diameter by 22 cm length Lutonix drug-coated angioplasty balloon  8.  Percutaneous transluminal angioplasty of the right profunda femoris artery with a 4 mm diameter by 10 cm length Lutonix drug-coated angioplasty balloon             9.  StarClose closure device left femoral artery  EBL: 150 cc  Contrast: 60 cc  Fluoro Time: 7.2 minutes  Moderate Conscious Sedation Time: approximately 60 minutes using 5 mg of Versed and 200 mcg of Fentanyl              Indications:  Patient is a 57 y.o.male with known severe PAD who had an intervention less than a month ago.  His foot is cold and he has early gangrenous changes to the foot and lower leg with severe rest pain. The patient is brought in for angiography for further evaluation and potential treatment.  Due to the limb threatening nature of the situation, angiogram was  performed for attempted limb salvage. The patient is aware that if the procedure fails, amputation would be expected.  The patient also understands that even with successful revascularization, amputation may still be required due to the severity of the situation.  Risks and benefits are discussed and informed consent is obtained.   Procedure:  The patient was identified and appropriate procedural time out was performed.  The patient was then placed supine on the table and prepped and draped in the usual sterile fashion. Moderate conscious sedation was administered during a face to face encounter with the patient throughout the procedure with my supervision of the RN administering medicines and monitoring the patient's vital signs, pulse oximetry, telemetry and mental status throughout from the start of the procedure until the patient was taken to the recovery room. Ultrasound was used to evaluate the left common femoral artery.  It was patent .  A digital ultrasound image was acquired.  A Seldinger needle was used to access the left common femoral artery under direct ultrasound guidance and a permanent image was performed.  A 0.035 J wire was advanced without resistance and a 5Fr sheath was placed.  Pigtail catheter was placed into the aorta and an AP aortogram was performed. This demonstrated sluggish  flow in the renal arteries and normal aorta and iliac segments without significant stenosis. I then crossed the aortic bifurcation and advanced to the right femoral head and then into the proximal SFA. Selective right lower extremity angiogram was then performed. This demonstrated thrombosis of the right SFA and popliteal artery interventions with no runoff distally. Minimal flow is seen below the groin. There is also now thrombus within the profunda femoris artery. It was felt that it was in the patient's best interest to proceed to attempt with intervention after these images to avoid a second procedure and a  larger amount of contrast and fluoroscopy based off of the findings from the initial angiogram. The patient was systemically heparinized and a 6 [place] [employer] sheath was then placed over the [employer] wire. I then used a Kumpe catheter and the advantage wire to easily navigate down into the TP trunk.  Imaging showed partial and greater than 50% thrombosis throughout the remaining peroneal artery which occluded in the mid to distal segment with absolutely no flow seen distally I then instilled 8 mg of TPA from the proximal SFA down through the entirety of the popliteal artery.  I then perform mechanical thrombectomy throughout the entire SFA, popliteal artery, and tibioperoneal trunk.  Minimal improvement was seen with no flow distally.  I then performed angioplasty.  2.5 mm diameter 2 cm length angioplasty balloon was inflated from the mid peroneal artery up to the tibioperoneal trunk and taken to 12 atm for 1 minute.  A 5 mm diameter by 22 cm length Lutonix drug-coated angioplasty balloon was inflated twice to encompass the entire popliteal artery and SFA.  Both inflations were about 10 atm for 1 minute.  Completion imaging showed basically no flow distally.  With a catheter placed back into the popliteal artery, blood refluxed up through the stents in the SFA and popliteal artery which were surprisingly not completely thrombosed and did have some flow.  There was just no flow distally in the tibial vessels however.  At this point, I did not think there was much would be able to do to salvage the leg and I turned my attention to the profunda femoris artery to try to remove the clot from this location.  Mechanical thrombectomy was performed over 0.018 wire after selective injection of the right profunda femoris artery was performed to evaluate the thrombosis.  Minimal improvement was seen.  A 4 mm diameter by 10 cm length Lutonix drug-coated angioplasty balloon was then inflated to 8 atm for 1 minute and  the proximal profunda femoris artery.  There was now some flow to the initial branches of the profunda femoris artery although there remained thrombosis and occlusion distally.  I did not feel there was much more we could do at this point. I elected to terminate the procedure. The sheath was removed and StarClose closure device was deployed in the left femoral artery with excellent hemostatic result. The patient was taken to the recovery room in stable condition having tolerated the procedure well.  Findings:               Aortogram:  Sluggish flow in the renal arteries, no aorta or iliac artery stenosis             Right lower Extremity:  Thrombosis of the right SFA and popliteal artery interventions with no runoff distally. Minimal flow is seen below the groin. There is also now thrombus within the profunda femoris artery.   Disposition: Patient was  taken to the recovery room in stable condition having tolerated the procedure well.  Complications: None  Leotis Pain 08/13/2018 4:24 PM   This note was created with Dragon Medical transcription system. Any errors in dictation are purely unintentional.

## 2018-08-13 NOTE — Progress Notes (Signed)
ANTICOAGULATION CONSULT NOTE - Initial Consult  Pharmacy Consult for heparin drip Indication: claudication  No Known Allergies  Patient Measurements: Height: 5\' 7"  (170.2 cm) Weight: 253 lb 8.5 oz (115 kg) IBW/kg (Calculated) : 66.1 Heparin Dosing Weight: 95 kg  Vital Signs: Temp: 98.7 F (37.1 C) (10/30 1448) Temp Source: Oral (10/30 1448) BP: 177/91 (10/30 1448) Pulse Rate: 89 (10/30 1448)  Labs: Recent Labs    08/12/18 0601 08/12/18 0631 08/12/18 1727 08/13/18 0247 08/13/18 1434  HGB 12.4*  --   --  11.4*  --   HCT 40.9  --   --  37.8*  --   PLT 359  --   --  330  --   APTT  --   --   --  42*  --   LABPROT  --  12.7  --   --   --   INR  --  0.96  --   --   --   HEPARINUNFRC  --   --  0.82* 0.16* 0.19*  CREATININE 6.87*  --   --  8.52*  --     Estimated Creatinine Clearance: 11.5 mL/min (A) (by C-G formula based on SCr of 8.52 mg/dL (H)).   Medical History: Past Medical History:  Diagnosis Date  . Anemia   . Atherosclerosis   . Chronic kidney disease   . Diabetes mellitus without complication ([place])   . Hypercholesteremia   . Hypertension   . Hypotension   . Stroke [facility])     Medications:  No anticoag in PTA meds  Assessment:  HEPARIN COURSE DATE TIME HL CHANGE 1029 0746 -- Start heparin - 5000 units bolus and initial rate 1600 units/hr 1029 1727 0.82 Decrease to 1400 units/hr 1030    0247    0.16     Increase to 1500 units/hr 1030    1434    0.19       Goal of Therapy:  Heparin level 0.3-0.7 units/ml Monitor platelets by anticoagulation protocol: Yes   Plan:  10/30 14:34 HL is subtherapeutic. Patient has gone to vascular lab for a procedure and Heparin is now turned off. Will follow up post procedure for Heparin plan.  Paulina Fusi, PharmD, BCPS 08/13/2018 3:43 PM

## 2018-08-13 NOTE — Progress Notes (Signed)
HD tx start    08/13/18 0928  Vital Signs  Pulse Rate 80  Pulse Rate Source Monitor  Resp 11  BP (!) 162/68  BP Location Right Arm  BP Method Automatic  Patient Position (if appropriate) Lying  Oxygen Therapy  SpO2 95 %  O2 Device Room Air  During Hemodialysis Assessment  Blood Flow Rate (mL/min) 400 mL/min  Arterial Pressure (mmHg) -170 mmHg  Venous Pressure (mmHg) 130 mmHg  Transmembrane Pressure (mmHg) 60 mmHg  Ultrafiltration Rate (mL/min) 670 mL/min  Dialysate Flow Rate (mL/min) 600 ml/min  Conductivity: Machine  13.8  HD Safety Checks Performed Yes  Dialysis Fluid Bolus Normal Saline  Bolus Amount (mL) 250 mL  Intra-Hemodialysis Comments Tx initiated  Fistula / Graft Left Forearm Arteriovenous fistula  No Placement Date or Time found.   Placed prior to admission: Yes  Orientation: Left  Access Location: Forearm  Access Type: Arteriovenous fistula  Status Accessed  Needle Size 15

## 2018-08-13 NOTE — Progress Notes (Signed)
Pt. C/o 10:10 pain.  Dr. Lucky Cowboy in to speak with pt. And wife via interpreter.  Toradol 30 mg IV given.  Heparin restarted per order.  Transferred to room via bed.

## 2018-08-14 ENCOUNTER — Encounter: Payer: Self-pay | Admitting: Vascular Surgery

## 2018-08-14 LAB — BASIC METABOLIC PANEL
Anion gap: 12 (ref 5–15)
BUN: 34 mg/dL — AB (ref 6–20)
CHLORIDE: 97 mmol/L — AB (ref 98–111)
CO2: 28 mmol/L (ref 22–32)
Calcium: 8.9 mg/dL (ref 8.9–10.3)
Creatinine, Ser: 6.5 mg/dL — ABNORMAL HIGH (ref 0.61–1.24)
GFR calc Af Amer: 10 mL/min — ABNORMAL LOW (ref >60)
GFR, EST NON AFRICAN AMERICAN: 8 mL/min — AB (ref >60)
GLUCOSE: 204 mg/dL — AB (ref 70–99)
POTASSIUM: 6.4 mmol/L — AB (ref 3.5–5.1)
Sodium: 137 mmol/L (ref 135–145)

## 2018-08-14 LAB — CBC
HCT: 37.9 % — ABNORMAL LOW (ref 39.0–52.0)
Hemoglobin: 11.6 g/dL — ABNORMAL LOW (ref 13.0–17.0)
MCH: 30.3 pg (ref 26.0–34.0)
MCHC: 30.6 g/dL (ref 30.0–36.0)
MCV: 99 fL (ref 80.0–100.0)
PLATELETS: 322 10*3/uL (ref 150–400)
RBC: 3.83 MIL/uL — AB (ref 4.22–5.81)
RDW: 15.8 % — ABNORMAL HIGH (ref 11.5–15.5)
WBC: 12.7 10*3/uL — ABNORMAL HIGH (ref 4.0–10.5)
nRBC: 0 % (ref 0.0–0.2)

## 2018-08-14 LAB — HEPATITIS B SURFACE ANTIBODY, QUANTITATIVE: Hepatitis B-Post: 270.4 m[IU]/mL (ref >9.9)

## 2018-08-14 LAB — GLUCOSE, CAPILLARY: Glucose-Capillary: 155 mg/dL — ABNORMAL HIGH (ref 70–99)

## 2018-08-14 LAB — HEPARIN LEVEL (UNFRACTIONATED)
Heparin Unfractionated: 0.43 IU/mL (ref 0.30–0.70)
Heparin Unfractionated: 0.41 IU/mL (ref 0.30–0.70)

## 2018-08-14 LAB — HEPATITIS B SURFACE ANTIGEN: Hepatitis B Surface Ag: NEGATIVE

## 2018-08-14 MED ORDER — TRAMADOL HCL 50 MG PO TABS: 50.0000 mg | ORAL_TABLET | Freq: Four times a day (QID) | ORAL | 0 refills | Status: DC | PRN

## 2018-08-14 MED ORDER — PATIROMER SORBITEX CALCIUM 8.4 G PO PACK
8.4000 g | PACK | Freq: Every day | ORAL | Status: DC
  Administered 2018-08-14: 8.4 g via ORAL
  Filled 2018-08-14: qty 1

## 2018-08-14 NOTE — Progress Notes (Signed)
[name] to be D/C'd Home per MD order.  Discussed prescriptions and follow up appointments with the patient. Prescriptions given to patient, medication list explained in detail. Pt verbalized understanding.    Vitals:   08/14/18 0445 08/14/18 0741  BP: 126/68 118/66  Pulse: 90 90  Resp: 18 19  Temp: 98.4 F (36.9 C) 98.5 F (36.9 C)  SpO2: 95% 94%    Tele box removed and returned. Skin clean, dry and intact without evidence of skin break down, no evidence of skin tears noted. IV catheter discontinued intact. Site without signs and symptoms of complications. Dressing and pressure applied. Pt denies pain at this time. No complaints noted.  An After Visit Summary was printed and given to the patient. Patient escorted via [place], and D/C home via private auto.  Rolley Sims

## 2018-08-14 NOTE — Progress Notes (Signed)
ANTICOAGULATION CONSULT NOTE - Initial Consult  Pharmacy Consult for heparin drip Indication: claudication  No Known Allergies  Patient Measurements: Height: 5\' 7"  (170.2 cm) Weight: 253 lb 8.5 oz (115 kg) IBW/kg (Calculated) : 66.1 Heparin Dosing Weight: 95 kg  Vital Signs: Temp: 98.2 F (36.8 C) (10/30 2055) Temp Source: Oral (10/30 2055) BP: 132/58 (10/30 2055) Pulse Rate: 84 (10/30 2055)  Labs: Recent Labs    08/12/18 0601 08/12/18 0631  08/13/18 0247 08/13/18 1434 08/14/18 0058  HGB 12.4*  --   --  11.4*  --  11.6*  HCT 40.9  --   --  37.8*  --  37.9*  PLT 359  --   --  330  --  322  APTT  --   --   --  42*  --   --   LABPROT  --  12.7  --   --   --   --   INR  --  0.96  --   --   --   --   HEPARINUNFRC  --   --    < > 0.16* 0.19* 0.43  CREATININE 6.87*  --   --  8.52*  --  6.50*   < > = values in this interval not displayed.    Estimated Creatinine Clearance: 15 mL/min (A) (by C-G formula based on SCr of 6.5 mg/dL (H)).   Medical History: Past Medical History:  Diagnosis Date  . Anemia   . Atherosclerosis   . Chronic kidney disease   . Diabetes mellitus without complication ([place])   . Hypercholesteremia   . Hypertension   . Hypotension   . Stroke [facility])     Medications:  No anticoag in PTA meds  Assessment:  HEPARIN COURSE DATE TIME HL CHANGE 1029 0746 -- Start heparin - 5000 units bolus and initial rate 1600 units/hr 1029 1727 0.82 Decrease to 1400 units/hr 1030    0247    0.16     Increase to 1500 units/hr 1030    1434    0.19       Goal of Therapy:  Heparin level 0.3-0.7 units/ml Monitor platelets by anticoagulation protocol: Yes   Plan:  10/30 16:44 spoke with Caryl Pina RN in specials recovery. She is restarting UFH infusion now. I told her level was low this afternoon so we should increase to 1750 units/hr. Dr. Lucky Cowboy requested no bolus, but resume post cath. Will start at 1750 units/hr and check HL in 8 hours.  10/31 0100 heparin level  0.43. Recheck in 8 hours to confirm.  Sim Boast, PharmD, BCPS  08/14/18 2:43 AM

## 2018-08-14 NOTE — Progress Notes (Signed)
ANTICOAGULATION CONSULT NOTE - Initial Consult  Pharmacy Consult for heparin drip Indication: claudication  No Known Allergies  Patient Measurements: Height: 5\' 7"  (170.2 cm) Weight: 247 lb 3.2 oz (112.1 kg) IBW/kg (Calculated) : 66.1 Heparin Dosing Weight: 95 kg  Vital Signs: Temp: 98.5 F (36.9 C) (10/31 0741) Temp Source: Oral (10/31 0741) BP: 118/66 (10/31 0741) Pulse Rate: 90 (10/31 0741)  Labs: Recent Labs    08/12/18 0601 08/12/18 0631  08/13/18 0247 08/13/18 1434 08/14/18 0058 08/14/18 0903  HGB 12.4*  --   --  11.4*  --  11.6*  --   HCT 40.9  --   --  37.8*  --  37.9*  --   PLT 359  --   --  330  --  322  --   APTT  --   --   --  42*  --   --   --   LABPROT  --  12.7  --   --   --   --   --   INR  --  0.96  --   --   --   --   --   HEPARINUNFRC  --   --    < > 0.16* 0.19* 0.43 0.41  CREATININE 6.87*  --   --  8.52*  --  6.50*  --    < > = values in this interval not displayed.    Estimated Creatinine Clearance: 14.8 mL/min (A) (by C-G formula based on SCr of 6.5 mg/dL (H)).   Medical History: Past Medical History:  Diagnosis Date  . Anemia   . Atherosclerosis   . Chronic kidney disease   . Diabetes mellitus without complication ([place])   . Hypercholesteremia   . Hypertension   . Hypotension   . Stroke [facility])     Medications:  No anticoag in PTA meds  Assessment:  HEPARIN COURSE DATE TIME HL CHANGE 1029 0746 -- Start heparin - 5000 units bolus and initial rate 1600 units/hr 1029 1727 0.82 Decrease to 1400 units/hr 1030    0247    0.16     Increase to 1500 units/hr 1030    1434    0.19       Goal of Therapy:  Heparin level 0.3-0.7 units/ml Monitor platelets by anticoagulation protocol: Yes   Plan:  10/31 09:03 HL therapeutic x 2. Continue current rate. Will monitor HL and CBC daily per protocol.  Eyvonne Burchfield A. Jordan Hawks, PharmD, BCPS  Clinical Pharmacist 08/14/18 9:32 AM

## 2018-08-14 NOTE — Discharge Instructions (Signed)
Endovascular Therapy for Peripheral Arterial Disease, Care After This sheet gives you information about how to care for yourself after your procedure. Your health care provider may also give you more specific instructions. If you have problems or questions, contact your health care provider. What can I expect after the procedure? After the procedure, it is common to have:  Pain.  Soreness and bruising around your puncture or incision (access site).  Fatigue.  Follow these instructions at home: Access site care  Follow instructions from your health care provider about how to take care of your access site. Make sure you: ? Wash your hands with soap and water before you change your bandage (dressing). If soap and water are not available, use hand sanitizer. ? Change your dressing as told by your health care provider. ? Leave stitches (sutures), skin glue, or adhesive strips in place. If adhesive strip edges start to loosen and curl up, you may trim the loose edges. Do not remove adhesive strips or skin glue completely unless your health care provider tells you to do that.  Check your access site every day for signs of infection. Check for: ? Redness, swelling, or pain. ? A lump or bump. ? Fluid or blood. ? Warmth. ? Pus or a bad smell. Medicines  Take over-the-counter and prescription medicines only as told by your health care provider. You may need to take medicines to prevent blood clots and to lower your cholesterol.  If you were prescribed antibiotic medicine, take it as told by your health care provider. Do not stop taking the antibiotic even if you start to feel better. Driving  Do not drive until your health care provider approves. You should: ? Not drive for 24 hours if you were given a medicine to help you relax (sedative) during your procedure. ? Not drive or use heavy machinery while taking prescription pain medicine. Activity  Do not lift anything that is heavier than 10  lb (4.5 kg) until your health care provider says that it is safe. You may have this lifting limit for several days.  Return to your normal activities as told by your health care provider. Ask your health care provider what activities are safe for you. ? Avoid activity that requires a lot of energy, such as exercise and sports, as told by your health care provider. ? Avoid sexual activity until your health care provider says it is safe.  Follow your exercise plan as told by your health care provider. Eating and drinking  Drink fluids as instructed to help wash (flush) dye used during the procedure out of your body.  Follow instructions from your health care provider about eating or drinking restrictions. You may need to eat a diet that is low in salt (sodium) and fat.  Avoid drinking alcohol. General instructions  Do not take baths, swim, or use a hot tub until your health care provider approves. You may take showers.  Do not use any products that contain nicotine or tobacco, such as cigarettes and e-cigarettes. If you need help quitting, ask your health care provider.  Keep all follow-up visits as told by your health care provider. This is important. Contact a health care provider if:  You have a fever.  You have severe pain that does not get better with medicine.  You have redness, swelling, or pain around your access site.  You have a fever.  You have a lump or bump at your access site. Get help right away if:  You have fluid or blood coming from your access site. If this happens, lie down on your back and apply pressure to the area.  You have chest pain.  You have problems breathing.  You have pain, numbness, or tingling in your legs.  You faint.  You have any symptoms of a stroke. BE FAST is an easy way to remember the main warning signs of a stroke: ? B - Balance. Signs are dizziness, sudden trouble walking, or loss of balance. ? E - Eyes. Signs are trouble seeing  or a sudden change in vision. ? F - Face. Signs are sudden weakness or numbness of the face, or the face or eyelid drooping on one side. ? A - Arms. Signs are weakness or numbness in an arm. This happens suddenly and usually on one side of the body. ? S - Speech. Signs are sudden trouble speaking, slurred speech, or trouble understanding what people say. ? T - Time. Time to call emergency services. Write down what time symptoms started.  You have other signs of a stroke, such as: ? A sudden, severe headache with no known cause. ? Nausea or vomiting. ? Seizure. These symptoms may represent a serious problem that is an emergency. Do not wait to see if the symptoms will go away. Get medical help right away. Call your local emergency services (911 in the [place] Do not drive yourself to the hospital. Summary  After the procedure, it is common to have pain and soreness near your puncture or incision (access site).  Check your access site every day for signs of infection, such as redness, swelling, or pain.  You may need to take medicines to prevent blood clots and to lower your cholesterol.  If you have any signs of a stroke, get help right away. This information is not intended to replace advice given to you by your health care provider. Make sure you discuss any questions you have with your health care provider. Document Released: 01/23/2017 Document Revised: 01/23/2017 Document Reviewed: 01/23/2017 Elsevier Interactive Patient Education  [employer].

## 2018-08-18 ENCOUNTER — Telehealth: Payer: Self-pay

## 2018-08-18 NOTE — Telephone Encounter (Signed)
Flagged on EMMI report for being unsure if he received discharge papers.  Called and spoke with patient with help of [employer] [phone] - Interpreter: Sharyn Lull, [place]: [id number]).  Patient confirms that he received his discharge papers and does not have any questions or concerns at this time.  I thanked him for his time and informed him that he would receive one more automated call checking in over the next few days.

## 2018-08-19 NOTE — Discharge Summary (Signed)
[facility] [place] NAME: [name]    MR#:  [id number]  DATE OF BIRTH:  March 22, 1961  DATE OF ADMISSION:  08/12/2018   ADMITTING PHYSICIAN: [name], MD  DATE OF DISCHARGE: 08/14/2018 10:19 AM  PRIMARY CARE PHYSICIAN: [name], MD   ADMISSION DIAGNOSIS:  Claudication ([place]) [I73.9] PAD (peripheral artery disease) ([place]) [I73.9] Critical lower limb ischemia [I99.8] DISCHARGE DIAGNOSIS:  Active Problems:   Arterial occlusion  SECONDARY DIAGNOSIS:   Past Medical History:  Diagnosis Date  . Anemia   . Atherosclerosis   . Chronic kidney disease   . Diabetes mellitus without complication ([place])   . Hypercholesteremia   . Hypertension   . Hypotension   . Stroke [facility])    HOSPITAL COURSE:  31 Y M with Rt LE acute limb ischemia/occlusion  * Rt LE acute limb ischemia/occlusion, PAD with rest pain and early gangrenous changes - s/p Angio and thrombectomy/PTCA of several vessels - eventually needs amputation but patient/family still deciding and will f/up with vascular as an outpt - continue asa, plavix and statin  Underwent Angio on 10/30 as below:  1. Ultrasound guidance for vascular access left femoral artery 2. Catheter placement into right SFA and profunda femorus arteries from left femoral approach 3. Aortogram and selective right lower extremity angiogram 4. catheter directed thrombolytic therapy with 8 mg of tpa to the right SFA and popliteal artery 5.  Mechanical thrombectomy with the penumbra cat 6 device to the right SFA, popliteal artery, tibioperoneal trunk, and profunda femoris arteries             6.  Percutaneous transluminal angioplasty of the right peroneal artery and tibioperoneal trunk with 2.5 mm diameter 22 cm length angioplasty balloon             7.  Percutaneous transluminal angioplasty of the entire right SFA and popliteal  arteries with 2 inflations with a 5 mm diameter by 22 cm length Lutonix drug-coated angioplasty balloon             8.  Percutaneous transluminal angioplasty of the right profunda femoris artery with a 4 mm diameter by 10 cm length Lutonix drug-coated angioplasty balloon 9. StarClose closure device left femoral artery DISCHARGE CONDITIONS:  stable CONSULTS OBTAINED:  Treatment Team:  Lavonia Dana, MD [name], MD DRUG ALLERGIES:  No Known Allergies DISCHARGE MEDICATIONS:   Allergies as of 08/14/2018   No Known Allergies     Medication List    STOP taking these medications   amoxicillin-clavulanate 875-125 MG tablet Commonly known as:  AUGMENTIN   aspirin 81 MG EC tablet     TAKE these medications   b complex-vitamin c-folic acid 0.8 MG Tabs tablet Take 1 tablet by mouth daily.   bisacodyl 5 MG EC tablet Commonly known as:  DULCOLAX Take 20 mg by mouth daily as needed for moderate constipation.   cinacalcet 60 MG tablet Commonly known as:  SENSIPAR Take 60 mg by mouth daily.   clopidogrel 75 MG tablet Commonly known as:  PLAVIX Take 1 tablet (75 mg total) by mouth daily.   ferrous sulfate 325 (65 FE) MG EC tablet Take 325 mg by mouth 3 (three) times daily with meals.   glipiZIDE 10 MG tablet Commonly known as:  GLUCOTROL Take 10 mg by mouth daily.   lidocaine-prilocaine cream Commonly known as:  EMLA Apply 1 application topically as needed (port access).   midodrine 10 MG tablet  Commonly known as:  PROAMATINE Take 5-10 mg by mouth See admin instructions. Take 1-2 tablets (5-10MG ) by mouth during dialysis as needed for low blood pressure   omeprazole 40 MG capsule Commonly known as:  PRILOSEC Take 40 mg by mouth 2 (two) times daily.   polyethylene glycol packet Commonly known as:  MIRALAX / GLYCOLAX Take 17 g by mouth daily as needed for mild constipation.   sevelamer carbonate 800 MG tablet Commonly known as:  RENVELA Take 2,400 mg  by mouth 3 (three) times daily with meals.   traMADol 50 MG tablet Commonly known as:  ULTRAM Take 1 tablet (50 mg total) by mouth every 6 (six) hours as needed.      DISCHARGE INSTRUCTIONS:   DIET:  Regular diet DISCHARGE CONDITION:  Good ACTIVITY:  Activity as tolerated OXYGEN:  Home Oxygen: No.  Oxygen Delivery: room air DISCHARGE LOCATION:  home   If you experience worsening of your admission symptoms, develop shortness of breath, life threatening emergency, suicidal or homicidal thoughts you must seek medical attention immediately by calling 911 or calling your MD immediately  if symptoms less severe.  You Must read complete instructions/literature along with all the possible adverse reactions/side effects for all the Medicines you take and that have been prescribed to you. Take any new Medicines after you have completely understood and accpet all the possible adverse reactions/side effects.   Please note  You were cared for by a hospitalist during your hospital stay. If you have any questions about your discharge medications or the care you received while you were in the hospital after you are discharged, you can call the unit and asked to speak with the hospitalist on call if the hospitalist that took care of you is not available. Once you are discharged, your primary care physician will handle any further medical issues. Please note that NO REFILLS for any discharge medications will be authorized once you are discharged, as it is imperative that you return to your primary care physician (or establish a relationship with a primary care physician if you do not have one) for your aftercare needs so that they can reassess your need for medications and monitor your lab values.    On the day of Discharge:  VITAL SIGNS:  Blood pressure 118/66, pulse 90, temperature 98.5 F (36.9 C), temperature source Oral, resp. rate 19, height 5\' 7"  (1.702 m), weight 112.1 kg, SpO2 94  %. PHYSICAL EXAMINATION:  GENERAL:  57 y.o.-year-old patient lying in the bed with no acute distress.  EYES: Pupils equal, round, reactive to light and accommodation. No scleral icterus. Extraocular muscles intact.  HEENT: Head atraumatic, normocephalic. Oropharynx and nasopharynx clear.  NECK:  Supple, no jugular venous distention. No thyroid enlargement, no tenderness.  LUNGS: Normal breath sounds bilaterally, no wheezing, rales,rhonchi or crepitation. No use of accessory muscles of respiration.  CARDIOVASCULAR: S1, S2 normal. No murmurs, rubs, or gallops.  ABDOMEN: Soft, non-tender, non-distended. Bowel sounds present. No organomegaly or mass.  EXTREMITIES: No pedal edema, cyanosis, or clubbing.  NEUROLOGIC: Cranial nerves II through XII are intact. Muscle strength 5/5 in all extremities. Sensation intact. Gait not checked.  PSYCHIATRIC: The patient is alert and oriented x 3.  SKIN: No obvious rash, lesion, or ulcer.  DATA REVIEW:   CBC Recent Labs  Lab 08/14/18 0058  WBC 12.7*  HGB 11.6*  HCT 37.9*  PLT 322    Chemistries  Recent Labs  Lab 08/13/18 0247 08/14/18 0058  NA 139 137  K 5.4* 6.4*  CL 98 97*  CO2 27 28  GLUCOSE 182* 204*  BUN 57* 34*  CREATININE 8.52* 6.50*  CALCIUM 8.5* 8.9  MG 2.8*  --      Microbiology Results  Results for orders placed or performed during the hospital encounter of 08/12/18  MRSA PCR Screening     Status: None   Collection Time: 08/13/18  9:08 AM  Result Value Ref Range Status   MRSA by PCR NEGATIVE NEGATIVE Final    Comment:        The GeneXpert MRSA Assay (FDA approved for NASAL specimens only), is one component of a comprehensive MRSA colonization surveillance program. It is not intended to diagnose MRSA infection nor to guide or monitor treatment for MRSA infections. Performed at [facility], [address], [place], [place] [address]     RADIOLOGY:  No results found.   Management plans discussed with  the patient, family and they are in agreement.  CODE STATUS: Prior   TOTAL TIME TAKING CARE OF THIS PATIENT: 45 minutes.    [name] M.D on 08/19/2018 at 7:09 AM  Between 7am to 6pm - Pager - [phone]  After 6pm go to [email or web address] - Proofreader  [employer] [place] [facility]  Office  [phone]  CC: Primary care physician; [name], MD   Note: This dictation was prepared with Dragon dictation along with smaller phrase technology. Any transcriptional errors that result from this process are unintentional.

## 2018-08-26 ENCOUNTER — Ambulatory Visit (INDEPENDENT_AMBULATORY_CARE_PROVIDER_SITE_OTHER): Payer: Medicare Other | Admitting: Vascular Surgery

## 2018-08-26 ENCOUNTER — Encounter (INDEPENDENT_AMBULATORY_CARE_PROVIDER_SITE_OTHER): Payer: Medicare Other

## 2018-08-28 ENCOUNTER — Ambulatory Visit (INDEPENDENT_AMBULATORY_CARE_PROVIDER_SITE_OTHER): Payer: Medicare Other | Admitting: Nurse Practitioner

## 2018-08-28 ENCOUNTER — Other Ambulatory Visit (INDEPENDENT_AMBULATORY_CARE_PROVIDER_SITE_OTHER): Payer: Self-pay | Admitting: Vascular Surgery

## 2018-08-28 ENCOUNTER — Ambulatory Visit (INDEPENDENT_AMBULATORY_CARE_PROVIDER_SITE_OTHER): Payer: Medicare Other

## 2018-08-28 ENCOUNTER — Encounter (INDEPENDENT_AMBULATORY_CARE_PROVIDER_SITE_OTHER): Payer: Self-pay | Admitting: Nurse Practitioner

## 2018-08-28 VITALS — BP 144/76 | HR 81 | Resp 19 | Ht 70.0 in | Wt 250.0 lb

## 2018-08-28 DIAGNOSIS — E1142 Type 2 diabetes mellitus with diabetic polyneuropathy: Secondary | ICD-10-CM

## 2018-08-28 DIAGNOSIS — N186 End stage renal disease: Secondary | ICD-10-CM

## 2018-08-28 DIAGNOSIS — Z992 Dependence on renal dialysis: Secondary | ICD-10-CM

## 2018-08-28 DIAGNOSIS — I739 Peripheral vascular disease, unspecified: Secondary | ICD-10-CM

## 2018-08-28 DIAGNOSIS — I70269 Atherosclerosis of native arteries of extremities with gangrene, unspecified extremity: Secondary | ICD-10-CM

## 2018-09-02 ENCOUNTER — Encounter (INDEPENDENT_AMBULATORY_CARE_PROVIDER_SITE_OTHER): Payer: Self-pay | Admitting: Nurse Practitioner

## 2018-09-02 NOTE — Progress Notes (Signed)
Subjective:    Patient ID: [name], male    DOB: 1961/03/24, 57 y.o.   MRN: [id number] Chief Complaint  Patient presents with  . Follow-up    5 week [facility] ABI check    HPI  [name] is a 57 y.o. male that presents today angiogram on 07/24/2018.  The angioplasty revealed diffusely disease with multiple is 70% stenosis throughout the course.  Patient and his wife state present on his right lower extremity have essentially healed wounds, however he has numerous little scrapes on his right lower extremity the patient and his wife are concerned that his leg currently needs to be amputated.  He denies any claudication-like symptoms.  He denies any rest pain like symptoms.  He denies any chest pain or shortness of breath.  He denies any amaurosis fugax or TIA-like symptoms.  The patient is present with his wife as well as an interpreter.  The patient underwent a hemodialysis access duplex today of his radiocephalic AV fistula.  He has a flow volume of 1080.  It is patent with no evidence of stenosis.  Patient also underwent ABIs which shows ABI of 1.07 on the right lower extremity.  With audibly monophasic anterior tibial waveforms audibly biphasic posterior tibial waveforms.  Past Medical History:  Diagnosis Date  . Anemia   . Atherosclerosis   . Chronic kidney disease   . Diabetes mellitus without complication ([place])   . Hypercholesteremia   . Hypertension   . Hypotension   . Stroke [facility])     Past Surgical History:  Procedure Laterality Date  . A/V FISTULAGRAM Left 01/09/2017   Procedure: A/V Fistulagram;  Surgeon: [name], MD;  Location: [place] [facility];  Service: Cardiovascular;  Laterality: Left;  . A/V FISTULAGRAM Left 06/06/2017   Procedure: A/V Fistulagram;  Surgeon: [name], MD;  Location: [place] [facility];  Service: Cardiovascular;  Laterality: Left;  . A/V FISTULAGRAM Left 08/15/2017   Procedure: A/V Fistulagram;  Surgeon: [name], MD;   Location: [place] [facility];  Service: Cardiovascular;  Laterality: Left;  . A/V FISTULAGRAM Left 01/22/2018   Procedure: A/V FISTULAGRAM;  Surgeon: [name], MD;  Location: [place] [facility];  Service: Cardiovascular;  Laterality: Left;  . A/V SHUNT INTERVENTION N/A 06/06/2017   Procedure: A/V SHUNT INTERVENTION;  Surgeon: [name], MD;  Location: [place] [facility];  Service: Cardiovascular;  Laterality: N/A;  . AMPUTATION Right 04/25/2016   Procedure: TOE AMPUTATION;  Surgeon: Katha Cabal, MD;  Location: [facility];  Service: Vascular;  Laterality: Right;  . APPLICATION OF WOUND VAC Right 04/25/2016   Procedure: APPLICATION OF WOUND VAC;  Surgeon: Katha Cabal, MD;  Location: [facility];  Service: Vascular;  Laterality: Right;  . AV FISTULA PLACEMENT Left 2015  . CARDIAC CATHETERIZATION N/A 04/30/2016   Procedure: Left Heart Cath and Coronary Angiography;  Surgeon: Wellington Hampshire, MD;  Location: [place] [facility];  Service: Cardiovascular;  Laterality: N/A;  . CATARACT EXTRACTION, BILATERAL    . LEG AMPUTATION BELOW KNEE Left   . LOWER EXTREMITY ANGIOGRAPHY Right 07/24/2018   Procedure: LOWER EXTREMITY ANGIOGRAPHY;  Surgeon: [name], MD;  Location: [place] [facility];  Service: Cardiovascular;  Laterality: Right;  . LOWER EXTREMITY ANGIOGRAPHY Right 08/13/2018   Procedure: LOWER EXTREMITY ANGIOGRAPHY;  Surgeon: [name], MD;  Location: [place] [facility];  Service: Cardiovascular;  Laterality: Right;  . PERIPHERAL VASCULAR  CATHETERIZATION N/A 10/03/2015   Procedure: A/V Shuntogram/Fistulagram;  Surgeon: [name], MD;  Location: [place] [facility];  Service: Cardiovascular;  Laterality: N/A;  . PERIPHERAL VASCULAR CATHETERIZATION N/A 01/16/2016   Procedure: Abdominal Aortogram w/Lower Extremity;  Surgeon: [name], MD;  Location: [place] [facility];  Service: Cardiovascular;  Laterality: N/A;  . PERIPHERAL VASCULAR CATHETERIZATION  01/16/2016    Procedure: Lower Extremity Intervention;  Surgeon: [name], MD;  Location: [place] [facility];  Service: Cardiovascular;;  . PERIPHERAL VASCULAR CATHETERIZATION Right 04/27/2016   Procedure: Lower Extremity Angiography;  Surgeon: Katha Cabal, MD;  Location: [place] [facility];  Service: Cardiovascular;  Laterality: Right;  . PERIPHERAL VASCULAR CATHETERIZATION  04/27/2016   Procedure: Lower Extremity Intervention;  Surgeon: Katha Cabal, MD;  Location: [place] [facility];  Service: Cardiovascular;;  . TRANSMETATARSAL AMPUTATION Right 04/28/2016   Procedure: TRANSMETATARSAL AMPUTATION;  Surgeon: Albertine Patricia, DPM;  Location: [facility];  Service: Podiatry;  Laterality: Right;  . WOUND DEBRIDEMENT Right 04/25/2016   Procedure: DEBRIDEMENT WOUND;  Surgeon: Katha Cabal, MD;  Location: [facility];  Service: Vascular;  Laterality: Right;    Social History   Socioeconomic History  . Marital status: Married    Spouse name: Not on file  . Number of children: Not on file  . Years of education: Not on file  . Highest education level: Not on file  Occupational History  . Not on file  Social Needs  . Financial resource strain: Not on file  . Food insecurity:    Worry: Patient refused    Inability: Patient refused  . Transportation needs:    Medical: No    Non-medical: No  Tobacco Use  . Smoking status: Never Smoker  . Smokeless tobacco: Never Used  Substance and Sexual Activity  . Alcohol use: No  . Drug use: No  . Sexual activity: Not on file  Lifestyle  . Physical activity:    Days per week: Not on file    Minutes per session: Not on file  . Stress: Not at all  Relationships  . Social connections:    Talks on phone: More than three times a week    Gets together: Three times a week    Attends religious service: Not on file    Active member of club or organization: Not on file    Attends meetings of clubs or organizations: Not on file    Relationship status:  Not on file  . Intimate partner violence:    Fear of current or ex partner: No    Emotionally abused: Not on file    Physically abused: Not on file    Forced sexual activity: Not on file  Other Topics Concern  . Not on file  Social History Narrative  . Not on file    Family History  Problem Relation Age of Onset  . Diabetes Father   . Prostate cancer Father     No Known Allergies   Review of Systems   Review of Systems: Negative Unless Checked Constitutional: [] Weight loss  [] Fever  [] Chills Cardiac: [] Chest pain   []  Atrial Fibrillation  [] Palpitations   [] Shortness of breath when laying flat   [] Shortness of breath with exertion. Vascular:  [] Pain in legs with walking   [] Pain in legs with standing  [] History of DVT   [] Phlebitis   [x] Swelling in legs   [] Varicose veins   [] Non-healing ulcers Pulmonary:   [] Uses home  oxygen   [] Productive cough   [] Hemoptysis   [] Wheeze  [] COPD   [] Asthma Neurologic:  [] Dizziness   [] Seizures   [] History of stroke   [] History of TIA  [] Aphasia   [] Vissual changes   [] Weakness or numbness in arm   [x] Weakness or numbness in leg Musculoskeletal:   [] Joint swelling   [] Joint pain   [] Low back pain  []  History of Knee Replacement Hematologic:  [] Easy bruising  [] Easy bleeding   [] Hypercoagulable state   [x] Anemic Gastrointestinal:  [] Diarrhea   [] Vomiting  [] Gastroesophageal reflux/heartburn   [] Difficulty swallowing. Genitourinary:  [x] Chronic kidney disease   [] Difficult urination  [x] Anuric   [] Blood in urine Skin:  [] Rashes   [] Ulcers  Psychological:  [] History of anxiety   []  History of major depression  []  Memory Difficulties     Objective:   Physical Exam  BP (!) 144/76 (BP Location: Right Arm, Patient Position: Sitting)   Pulse 81   Resp 19   Ht 5\' 10"  (1.778 m)   Wt 250 lb (113.4 kg)   BMI 35.87 kg/m   Gen: WD/WN, NAD Head: [place]/AT, No temporalis wasting.  Ear/Nose/Throat: Hearing grossly intact, nares w/o erythema or  drainage Eyes: PER, EOMI, sclera nonicteric.  Neck: Supple, no masses.  No JVD.  Pulmonary:  Good air movement, no use of accessory muscles.  Cardiac: RRR Vascular:  Multiple superficial wounds on his left lower extremity.  There is types of scrapes.  No necrotic or tissue present.  Good thrill and bruit of AV fistula.  2+ edema on right lower extremity. Vessel Right Left  Radial Palpable Palpable   Gastrointestinal: soft, non-distended. No guarding/no peritoneal signs.  Musculoskeletal: Left below-knee amputation.  Right transmetatarsal irritation. Neurologic: Pain and light touch intact in extremities.  Symmetrical.  Speech is fluent. Motor exam as listed above. Psychiatric: Judgment intact, Mood & affect appropriate for pt's clinical situation. Dermatologic: No Venous rashes. No Ulcers Noted.  No changes consistent with cellulitis. Lymph : No Cervical lymphadenopathy, no lichenification or skin changes of chronic lymphedema.      Assessment & Plan:   1. ESRD on dialysis [facility]) Recommend:  The patient is doing well and currently has adequate dialysis access. The patient's dialysis center is not reporting any access issues. Flow pattern is stable when compared to the prior ultrasound.  The patient should have a duplex ultrasound of the dialysis access in 6 months. The patient will follow-up with me in the office after each ultrasound     2. Diabetic polyneuropathy associated with type 2 diabetes mellitus ([place]) Continue hypoglycemic medications as already ordered, these medications have been reviewed and there are no changes at this time.  Hgb A1C to be monitored as already arranged by primary service   3. PVD (peripheral vascular disease) ([place]) The patient had immediate amputation at this time, however amputation of his right lower extremity could be a possibility.  Currently he denies any pain or new ulceration of the right lower extremity.  Advised him that at this point  we would continue to monitor the status and intervene as necessary.  Patient wife understood  Patient will follow-up lower extremity duplex in 3 months.  - VAS [place] LOWER EXTREMITY ARTERIAL DUPLEX; Future   Current Outpatient Medications on File Prior to Visit  Medication Sig Dispense Refill  . b complex-vitamin c-folic acid (NEPHRO-VITE) 0.8 MG TABS tablet Take 1 tablet by mouth daily.    . bisacodyl (DULCOLAX) 5 MG EC tablet Take 20 mg by  mouth daily as needed for moderate constipation.    . cinacalcet (SENSIPAR) 60 MG tablet Take 60 mg by mouth daily.     . clopidogrel (PLAVIX) 75 MG tablet Take 1 tablet (75 mg total) by mouth daily. 30 tablet 11  . ferrous sulfate 325 (65 FE) MG EC tablet Take 325 mg by mouth 3 (three) times daily with meals.    Marland Kitchen glipiZIDE (GLUCOTROL) 10 MG tablet Take 10 mg by mouth daily.     Marland Kitchen lidocaine-prilocaine (EMLA) cream Apply 1 application topically as needed (port access).    . midodrine (PROAMATINE) 10 MG tablet Take 5-10 mg by mouth See admin instructions. Take 1-2 tablets (5-10MG ) by mouth during dialysis as needed for low blood pressure    . omeprazole (PRILOSEC) 40 MG capsule Take 40 mg by mouth 2 (two) times daily.     . polyethylene glycol (MIRALAX / GLYCOLAX) packet Take 17 g by mouth daily as needed for mild constipation.    . sevelamer carbonate (RENVELA) 800 MG tablet Take 2,400 mg by mouth 3 (three) times daily with meals.     . traMADol (ULTRAM) 50 MG tablet Take 1 tablet (50 mg total) by mouth every 6 (six) hours as needed. 20 tablet 0   No current facility-administered medications on file prior to visit.     There are no Patient Instructions on file for this visit. No follow-ups on file.   Kris Hartmann, NP  This note was completed with Sales executive.  Any errors are purely unintentional.

## 2018-09-03 ENCOUNTER — Other Ambulatory Visit: Payer: Self-pay

## 2018-09-03 ENCOUNTER — Encounter: Payer: Self-pay | Admitting: Emergency Medicine

## 2018-09-03 ENCOUNTER — Inpatient Hospital Stay
Admission: EM | Admit: 2018-09-03 | Discharge: 2018-09-15 | DRG: 270 | Disposition: A | Discharge status: Skilled Nursing Facility | Payer: Medicare Other | Attending: Internal Medicine | Admitting: Internal Medicine

## 2018-09-03 DIAGNOSIS — E1151 Type 2 diabetes mellitus with diabetic peripheral angiopathy without gangrene: Secondary | ICD-10-CM | POA: Diagnosis present

## 2018-09-03 DIAGNOSIS — N179 Acute kidney failure, unspecified: Secondary | ICD-10-CM | POA: Diagnosis not present

## 2018-09-03 DIAGNOSIS — I7092 Chronic total occlusion of artery of the extremities: Secondary | ICD-10-CM | POA: Diagnosis present

## 2018-09-03 DIAGNOSIS — I493 Ventricular premature depolarization: Secondary | ICD-10-CM | POA: Diagnosis not present

## 2018-09-03 DIAGNOSIS — Z992 Dependence on renal dialysis: Secondary | ICD-10-CM

## 2018-09-03 DIAGNOSIS — I480 Paroxysmal atrial fibrillation: Secondary | ICD-10-CM | POA: Diagnosis not present

## 2018-09-03 DIAGNOSIS — I9589 Other hypotension: Secondary | ICD-10-CM | POA: Diagnosis present

## 2018-09-03 DIAGNOSIS — D631 Anemia in chronic kidney disease: Secondary | ICD-10-CM | POA: Diagnosis present

## 2018-09-03 DIAGNOSIS — E1152 Type 2 diabetes mellitus with diabetic peripheral angiopathy with gangrene: Principal | ICD-10-CM | POA: Diagnosis present

## 2018-09-03 DIAGNOSIS — I4729 Other ventricular tachycardia: Secondary | ICD-10-CM

## 2018-09-03 DIAGNOSIS — Z89512 Acquired absence of left leg below knee: Secondary | ICD-10-CM

## 2018-09-03 DIAGNOSIS — I70261 Atherosclerosis of native arteries of extremities with gangrene, right leg: Secondary | ICD-10-CM | POA: Diagnosis present

## 2018-09-03 DIAGNOSIS — E785 Hyperlipidemia, unspecified: Secondary | ICD-10-CM | POA: Diagnosis present

## 2018-09-03 DIAGNOSIS — I2510 Atherosclerotic heart disease of native coronary artery without angina pectoris: Secondary | ICD-10-CM | POA: Diagnosis present

## 2018-09-03 DIAGNOSIS — N186 End stage renal disease: Secondary | ICD-10-CM | POA: Diagnosis present

## 2018-09-03 DIAGNOSIS — I70221 Atherosclerosis of native arteries of extremities with rest pain, right leg: Secondary | ICD-10-CM | POA: Diagnosis present

## 2018-09-03 DIAGNOSIS — E7800 Pure hypercholesterolemia, unspecified: Secondary | ICD-10-CM | POA: Diagnosis present

## 2018-09-03 DIAGNOSIS — I998 Other disorder of circulatory system: Secondary | ICD-10-CM | POA: Diagnosis present

## 2018-09-03 DIAGNOSIS — I472 Ventricular tachycardia: Secondary | ICD-10-CM | POA: Diagnosis not present

## 2018-09-03 DIAGNOSIS — I70269 Atherosclerosis of native arteries of extremities with gangrene, unspecified extremity: Secondary | ICD-10-CM | POA: Diagnosis present

## 2018-09-03 DIAGNOSIS — E875 Hyperkalemia: Secondary | ICD-10-CM | POA: Diagnosis present

## 2018-09-03 DIAGNOSIS — Z8674 Personal history of sudden cardiac arrest: Secondary | ICD-10-CM

## 2018-09-03 DIAGNOSIS — Z833 Family history of diabetes mellitus: Secondary | ICD-10-CM

## 2018-09-03 DIAGNOSIS — Z7982 Long term (current) use of aspirin: Secondary | ICD-10-CM

## 2018-09-03 DIAGNOSIS — Z79899 Other long term (current) drug therapy: Secondary | ICD-10-CM

## 2018-09-03 DIAGNOSIS — K5900 Constipation, unspecified: Secondary | ICD-10-CM | POA: Diagnosis not present

## 2018-09-03 DIAGNOSIS — I2489 Other forms of acute ischemic heart disease: Secondary | ICD-10-CM

## 2018-09-03 DIAGNOSIS — I248 Other forms of acute ischemic heart disease: Secondary | ICD-10-CM

## 2018-09-03 DIAGNOSIS — Z8673 Personal history of transient ischemic attack (TIA), and cerebral infarction without residual deficits: Secondary | ICD-10-CM

## 2018-09-03 DIAGNOSIS — I5042 Chronic combined systolic (congestive) and diastolic (congestive) heart failure: Secondary | ICD-10-CM | POA: Diagnosis present

## 2018-09-03 DIAGNOSIS — K5903 Drug induced constipation: Secondary | ICD-10-CM | POA: Diagnosis present

## 2018-09-03 DIAGNOSIS — N2581 Secondary hyperparathyroidism of renal origin: Secondary | ICD-10-CM | POA: Diagnosis present

## 2018-09-03 DIAGNOSIS — I744 Embolism and thrombosis of arteries of extremities, unspecified: Secondary | ICD-10-CM | POA: Diagnosis not present

## 2018-09-03 DIAGNOSIS — Z7902 Long term (current) use of antithrombotics/antiplatelets: Secondary | ICD-10-CM

## 2018-09-03 DIAGNOSIS — Z7984 Long term (current) use of oral hypoglycemic drugs: Secondary | ICD-10-CM

## 2018-09-03 DIAGNOSIS — E1122 Type 2 diabetes mellitus with diabetic chronic kidney disease: Secondary | ICD-10-CM | POA: Diagnosis present

## 2018-09-03 DIAGNOSIS — I132 Hypertensive heart and chronic kidney disease with heart failure and with stage 5 chronic kidney disease, or end stage renal disease: Secondary | ICD-10-CM | POA: Diagnosis present

## 2018-09-03 DIAGNOSIS — I4891 Unspecified atrial fibrillation: Secondary | ICD-10-CM | POA: Diagnosis not present

## 2018-09-03 DIAGNOSIS — Z89421 Acquired absence of other right toe(s): Secondary | ICD-10-CM

## 2018-09-03 HISTORY — DX: Type 2 diabetes mellitus with unspecified complications: E11.8

## 2018-09-03 HISTORY — DX: Anemia in other chronic diseases classified elsewhere: D63.8

## 2018-09-03 HISTORY — DX: Disorder of arteries and arterioles, unspecified: I77.9

## 2018-09-03 HISTORY — DX: Hyperlipidemia, unspecified: E78.5

## 2018-09-03 HISTORY — DX: Acquired absence of right leg above knee: Z89.611

## 2018-09-03 HISTORY — DX: Atherosclerotic heart disease of native coronary artery without angina pectoris: I25.10

## 2018-09-03 HISTORY — DX: Other ventricular tachycardia: I47.29

## 2018-09-03 HISTORY — DX: Other specified symptoms and signs involving the circulatory and respiratory systems: R09.89

## 2018-09-03 HISTORY — DX: Ventricular tachycardia: I47.2

## 2018-09-03 HISTORY — DX: Dependence on renal dialysis: Z99.2

## 2018-09-03 HISTORY — DX: Personal history of other diseases of the circulatory system: Z86.79

## 2018-09-03 HISTORY — DX: Dependence on renal dialysis: N18.6

## 2018-09-03 HISTORY — DX: Acquired absence of left leg below knee: Z89.512

## 2018-09-03 LAB — BASIC METABOLIC PANEL
Anion gap: 15 (ref 5–15)
BUN: 25 mg/dL — AB (ref 6–20)
CALCIUM: 9.1 mg/dL (ref 8.9–10.3)
CO2: 28 mmol/L (ref 22–32)
Chloride: 93 mmol/L — ABNORMAL LOW (ref 98–111)
Creatinine, Ser: 5.27 mg/dL — ABNORMAL HIGH (ref 0.61–1.24)
GFR calc Af Amer: 13 mL/min — ABNORMAL LOW (ref >60)
GFR, EST NON AFRICAN AMERICAN: 11 mL/min — AB (ref >60)
GLUCOSE: 228 mg/dL — AB (ref 70–99)
POTASSIUM: 5.7 mmol/L — AB (ref 3.5–5.1)
SODIUM: 136 mmol/L (ref 135–145)

## 2018-09-03 LAB — CBC WITH DIFFERENTIAL/PLATELET
ABS IMMATURE GRANULOCYTES: 0.07 10*3/uL (ref 0.00–0.07)
BASOS PCT: 1 %
Basophils Absolute: 0.1 10*3/uL (ref 0.0–0.1)
EOS ABS: 0.2 10*3/uL (ref 0.0–0.5)
Eosinophils Relative: 1 %
HCT: 43 % (ref 39.0–52.0)
Hemoglobin: 13.2 g/dL (ref 13.0–17.0)
Immature Granulocytes: 1 %
Lymphocytes Relative: 9 %
Lymphs Abs: 1.3 10*3/uL (ref 0.7–4.0)
MCH: 29.9 pg (ref 26.0–34.0)
MCHC: 30.7 g/dL (ref 30.0–36.0)
MCV: 97.3 fL (ref 80.0–100.0)
MONO ABS: 1.3 10*3/uL — AB (ref 0.1–1.0)
Monocytes Relative: 10 %
NEUTROS ABS: 10.5 10*3/uL — AB (ref 1.7–7.7)
NEUTROS PCT: 78 %
PLATELETS: 309 10*3/uL (ref 150–400)
RBC: 4.42 MIL/uL (ref 4.22–5.81)
RDW: 16.2 % — ABNORMAL HIGH (ref 11.5–15.5)
WBC: 13.4 10*3/uL — AB (ref 4.0–10.5)
nRBC: 0 % (ref 0.0–0.2)

## 2018-09-03 LAB — APTT: APTT: 34 s (ref 24–36)

## 2018-09-03 LAB — CG4 I-STAT (LACTIC ACID): LACTIC ACID, VENOUS: 1.7 mmol/L (ref 0.5–1.9)

## 2018-09-03 LAB — GLUCOSE, CAPILLARY: GLUCOSE-CAPILLARY: 209 mg/dL — AB (ref 70–99)

## 2018-09-03 LAB — PROTIME-INR
INR: 1.16
PROTHROMBIN TIME: 14.7 s (ref 11.4–15.2)

## 2018-09-03 MED ORDER — RENA-VITE PO TABS
1.0000 | ORAL_TABLET | Freq: Every day | ORAL | Status: DC
  Administered 2018-09-03 – 2018-09-14 (×12): 1 via ORAL
  Filled 2018-09-03 (×13): qty 1

## 2018-09-03 MED ORDER — HYDROMORPHONE HCL 1 MG/ML IJ SOLN
0.5000 mg | INTRAMUSCULAR | Status: DC | PRN
  Administered 2018-09-04 – 2018-09-05 (×6): 0.5 mg via INTRAVENOUS
  Filled 2018-09-03 (×5): qty 0.5

## 2018-09-03 MED ORDER — INSULIN ASPART 100 UNIT/ML ~~LOC~~ SOLN
0.0000 [IU] | Freq: Three times a day (TID) | SUBCUTANEOUS | Status: DC
  Administered 2018-09-04 (×2): 1 [IU] via SUBCUTANEOUS
  Administered 2018-09-05 – 2018-09-07 (×8): 2 [IU] via SUBCUTANEOUS
  Administered 2018-09-08: 3 [IU] via SUBCUTANEOUS
  Administered 2018-09-08 – 2018-09-09 (×4): 2 [IU] via SUBCUTANEOUS
  Administered 2018-09-10: 3 [IU] via SUBCUTANEOUS
  Administered 2018-09-10: 2 [IU] via SUBCUTANEOUS
  Administered 2018-09-10: 3 [IU] via SUBCUTANEOUS
  Administered 2018-09-11: 5 [IU] via SUBCUTANEOUS
  Administered 2018-09-11: 2 [IU] via SUBCUTANEOUS
  Administered 2018-09-11: 5 [IU] via SUBCUTANEOUS
  Administered 2018-09-12 (×2): 2 [IU] via SUBCUTANEOUS
  Administered 2018-09-13: 3 [IU] via SUBCUTANEOUS
  Administered 2018-09-13 – 2018-09-15 (×5): 2 [IU] via SUBCUTANEOUS
  Administered 2018-09-15: 1 [IU] via SUBCUTANEOUS
  Filled 2018-09-03 (×29): qty 1

## 2018-09-03 MED ORDER — HEPARIN BOLUS VIA INFUSION
4900.0000 [IU] | Freq: Once | INTRAVENOUS | Status: AC
  Administered 2018-09-03: 4900 [IU] via INTRAVENOUS
  Filled 2018-09-03: qty 4900

## 2018-09-03 MED ORDER — FERROUS SULFATE 325 (65 FE) MG PO TABS
325.0000 mg | ORAL_TABLET | Freq: Three times a day (TID) | ORAL | Status: DC
  Administered 2018-09-05 – 2018-09-15 (×26): 325 mg via ORAL
  Filled 2018-09-03 (×26): qty 1

## 2018-09-03 MED ORDER — PANTOPRAZOLE SODIUM 40 MG PO TBEC
40.0000 mg | DELAYED_RELEASE_TABLET | Freq: Two times a day (BID) | ORAL | Status: DC
  Administered 2018-09-03 – 2018-09-15 (×22): 40 mg via ORAL
  Filled 2018-09-03 (×22): qty 1

## 2018-09-03 MED ORDER — POLYETHYLENE GLYCOL 3350 17 G PO PACK
17.0000 g | PACK | Freq: Two times a day (BID) | ORAL | Status: DC
  Administered 2018-09-05 – 2018-09-15 (×12): 17 g via ORAL
  Filled 2018-09-03 (×21): qty 1

## 2018-09-03 MED ORDER — DOCUSATE SODIUM 100 MG PO CAPS
100.0000 mg | ORAL_CAPSULE | Freq: Two times a day (BID) | ORAL | Status: DC | PRN
  Administered 2018-09-07: 100 mg via ORAL
  Filled 2018-09-03: qty 1

## 2018-09-03 MED ORDER — CINACALCET HCL 30 MG PO TABS
60.0000 mg | ORAL_TABLET | Freq: Every day | ORAL | Status: DC
  Filled 2018-09-03: qty 2

## 2018-09-03 MED ORDER — ASPIRIN EC 81 MG PO TBEC
81.0000 mg | DELAYED_RELEASE_TABLET | Freq: Every day | ORAL | Status: DC
  Administered 2018-09-05 – 2018-09-15 (×10): 81 mg via ORAL
  Filled 2018-09-03 (×11): qty 1

## 2018-09-03 MED ORDER — LIDOCAINE-PRILOCAINE 2.5-2.5 % EX CREA
1.0000 "application " | TOPICAL_CREAM | CUTANEOUS | Status: DC | PRN
  Filled 2018-09-03: qty 5

## 2018-09-03 MED ORDER — SEVELAMER CARBONATE 800 MG PO TABS
2400.0000 mg | ORAL_TABLET | Freq: Three times a day (TID) | ORAL | Status: DC
  Administered 2018-09-05 – 2018-09-14 (×19): 2400 mg via ORAL
  Filled 2018-09-03 (×20): qty 3

## 2018-09-03 MED ORDER — TRAMADOL HCL 50 MG PO TABS
50.0000 mg | ORAL_TABLET | Freq: Four times a day (QID) | ORAL | Status: DC | PRN
  Filled 2018-09-03: qty 1

## 2018-09-03 MED ORDER — CEFAZOLIN SODIUM-DEXTROSE 1-4 GM/50ML-% IV SOLN
1.0000 g | INTRAVENOUS | Status: AC
  Administered 2018-09-04: 1 g via INTRAVENOUS
  Filled 2018-09-03: qty 50

## 2018-09-03 MED ORDER — HEPARIN (PORCINE) 25000 UT/250ML-% IV SOLN
2550.0000 [IU]/h | INTRAVENOUS | Status: DC
  Administered 2018-09-03: 1600 [IU]/h via INTRAVENOUS
  Administered 2018-09-04: 2000 [IU]/h via INTRAVENOUS
  Administered 2018-09-05: 2350 [IU]/h via INTRAVENOUS
  Administered 2018-09-05: 2150 [IU]/h via INTRAVENOUS
  Administered 2018-09-06 (×2): 2450 [IU]/h via INTRAVENOUS
  Administered 2018-09-07: 2550 [IU]/h via INTRAVENOUS
  Administered 2018-09-07: 2450 [IU]/h via INTRAVENOUS
  Filled 2018-09-03 (×12): qty 250

## 2018-09-03 NOTE — ED Notes (Signed)
Report was given at this time.

## 2018-09-03 NOTE — ED Notes (Signed)
Verified with Gershon Mussel RN

## 2018-09-03 NOTE — ED Triage Notes (Signed)
Patient complaining of "biting" pain in right foot. Patient foot blue and cold. No palpable pulses.

## 2018-09-03 NOTE — ED Notes (Signed)
1603 lactic was changed to Istat lactic per Dr Clearnce Hasten

## 2018-09-03 NOTE — ED Notes (Signed)
Lab states that they have a blue top to add on PTT and PTINR

## 2018-09-03 NOTE — H&P (Signed)
[facility] [place] NAME: [name]    MR#:  [id number]  DATE OF BIRTH:  1961/07/07  DATE OF ADMISSION:  09/03/2018  PRIMARY CARE PHYSICIAN: Alene Mires Elyse Jarvis, MD   REQUESTING/REFERRING PHYSICIAN: Mariea Clonts  CHIEF COMPLAINT:  No chief complaint on file.   HISTORY OF PRESENT ILLNESS: [name]  is a 57 y.o. male with a known history of anemia, atherosclerosis, stage renal disease now on his mellitus with complications of atherosclerosis of peripheral arteries and status post amputation, hypertension, hypercholesterolemia, stroke-presented to hospital 3 weeks ago with early ischemic changes in right lower extremity where angiogram was done by vascular and thrombectomy on several vessels was done.  They were also discussing about amputation at that time but no clear decision was made. Patient continue to take aspirin and Plavix at home as advised.  Since last 2 to 3 days his right lower extremity is getting cold and having pain so brought to emergency room today again. ER physician spoke to vascular surgery and they suggested to start on heparin drip and admit to hospitalist service for possible amputation planning tomorrow.  PAST MEDICAL HISTORY:   Past Medical History:  Diagnosis Date  . Anemia   . Atherosclerosis   . Chronic kidney disease   . Diabetes mellitus without complication ([place])   . Hypercholesteremia   . Hypertension   . Hypotension   . Stroke [facility])     PAST SURGICAL HISTORY:  Past Surgical History:  Procedure Laterality Date  . A/V FISTULAGRAM Left 01/09/2017   Procedure: A/V Fistulagram;  Surgeon: [name], MD;  Location: [place] [facility];  Service: Cardiovascular;  Laterality: Left;  . A/V FISTULAGRAM Left 06/06/2017   Procedure: A/V Fistulagram;  Surgeon: [name], MD;  Location: [place] [facility];  Service: Cardiovascular;  Laterality: Left;  . A/V FISTULAGRAM Left 08/15/2017   Procedure: A/V  Fistulagram;  Surgeon: [name], MD;  Location: [place] [facility];  Service: Cardiovascular;  Laterality: Left;  . A/V FISTULAGRAM Left 01/22/2018   Procedure: A/V FISTULAGRAM;  Surgeon: [name], MD;  Location: [place] [facility];  Service: Cardiovascular;  Laterality: Left;  . A/V SHUNT INTERVENTION N/A 06/06/2017   Procedure: A/V SHUNT INTERVENTION;  Surgeon: [name], MD;  Location: [place] [facility];  Service: Cardiovascular;  Laterality: N/A;  . AMPUTATION Right 04/25/2016   Procedure: TOE AMPUTATION;  Surgeon: Katha Cabal, MD;  Location: [facility];  Service: Vascular;  Laterality: Right;  . APPLICATION OF WOUND VAC Right 04/25/2016   Procedure: APPLICATION OF WOUND VAC;  Surgeon: Katha Cabal, MD;  Location: [facility];  Service: Vascular;  Laterality: Right;  . AV FISTULA PLACEMENT Left 2015  . CARDIAC CATHETERIZATION N/A 04/30/2016   Procedure: Left [name] Cath and Coronary Angiography;  Surgeon: Wellington Hampshire, MD;  Location: [place] [facility];  Service: Cardiovascular;  Laterality: N/A;  . CATARACT EXTRACTION, BILATERAL    . LEG AMPUTATION BELOW KNEE Left   . LOWER EXTREMITY ANGIOGRAPHY Right 07/24/2018   Procedure: LOWER EXTREMITY ANGIOGRAPHY;  Surgeon: [name], MD;  Location: [place] [facility];  Service: Cardiovascular;  Laterality: Right;  . LOWER EXTREMITY ANGIOGRAPHY Right 08/13/2018   Procedure: LOWER EXTREMITY ANGIOGRAPHY;  Surgeon: [name], MD;  Location: [place] [facility];  Service: Cardiovascular;  Laterality: Right;  . PERIPHERAL VASCULAR CATHETERIZATION N/A 10/03/2015   Procedure: A/V Shuntogram/Fistulagram;  Surgeon: Erskine Squibb  Lucky Cowboy, MD;  Location: [place] [facility];  Service: Cardiovascular;  Laterality: N/A;  . PERIPHERAL VASCULAR CATHETERIZATION N/A 01/16/2016   Procedure: Abdominal Aortogram w/Lower Extremity;  Surgeon: [name], MD;  Location: [place] [facility];  Service: Cardiovascular;  Laterality: N/A;  . PERIPHERAL  VASCULAR CATHETERIZATION  01/16/2016   Procedure: Lower Extremity Intervention;  Surgeon: [name], MD;  Location: [place] [facility];  Service: Cardiovascular;;  . PERIPHERAL VASCULAR CATHETERIZATION Right 04/27/2016   Procedure: Lower Extremity Angiography;  Surgeon: Katha Cabal, MD;  Location: [place] [facility];  Service: Cardiovascular;  Laterality: Right;  . PERIPHERAL VASCULAR CATHETERIZATION  04/27/2016   Procedure: Lower Extremity Intervention;  Surgeon: Katha Cabal, MD;  Location: [place] [facility];  Service: Cardiovascular;;  . TRANSMETATARSAL AMPUTATION Right 04/28/2016   Procedure: TRANSMETATARSAL AMPUTATION;  Surgeon: Albertine Patricia, DPM;  Location: [facility];  Service: Podiatry;  Laterality: Right;  . WOUND DEBRIDEMENT Right 04/25/2016   Procedure: DEBRIDEMENT WOUND;  Surgeon: Katha Cabal, MD;  Location: [facility];  Service: Vascular;  Laterality: Right;    SOCIAL HISTORY:  Social History   Tobacco Use  . Smoking status: Never Smoker  . Smokeless tobacco: Never Used  Substance Use Topics  . Alcohol use: No    FAMILY HISTORY:  Family History  Problem Relation Age of Onset  . Diabetes Father   . Prostate cancer Father     DRUG ALLERGIES: No Known Allergies  REVIEW OF SYSTEMS:   CONSTITUTIONAL: No fever, fatigue or weakness.  EYES: No blurred or double vision.  EARS, NOSE, AND THROAT: No tinnitus or ear pain.  RESPIRATORY: No cough, shortness of breath, wheezing or hemoptysis.  CARDIOVASCULAR: No chest pain, orthopnea, edema.  GASTROINTESTINAL: No nausea, vomiting, diarrhea or abdominal pain.  GENITOURINARY: No dysuria, hematuria.  ENDOCRINE: No polyuria, nocturia,  HEMATOLOGY: No anemia, easy bruising or bleeding SKIN: No rash or lesion. MUSCULOSKELETAL: No joint pain or arthritis.  Right lower extremity cold in painful. NEUROLOGIC: No tingling, numbness, weakness.  PSYCHIATRY: No anxiety or depression.   MEDICATIONS AT HOME:  Prior to  Admission medications   Medication Sig Start Date End Date Taking? Authorizing Provider  aspirin EC 81 MG tablet Take 81 mg by mouth daily.   Yes [provider]  b complex-vitamin c-folic acid (NEPHRO-VITE) 0.8 MG TABS tablet Take 1 tablet by mouth daily.   Yes [provider]  cinacalcet (SENSIPAR) 60 MG tablet Take 60 mg by mouth daily.    Yes [provider]  clopidogrel (PLAVIX) 75 MG tablet Take 1 tablet (75 mg total) by mouth daily. 07/24/18  Yes [name], Erskine Squibb, MD  ferrous sulfate 325 (65 FE) MG EC tablet Take 325 mg by mouth 3 (three) times daily with meals.   Yes [provider]  glipiZIDE (GLUCOTROL) 10 MG tablet Take 10 mg by mouth daily.  10/25/16  Yes [provider]  lidocaine-prilocaine (EMLA) cream Apply 1 application topically as needed (port access).   Yes [provider]  omeprazole (PRILOSEC) 40 MG capsule Take 40 mg by mouth 2 (two) times daily.    Yes [provider]  polyethylene glycol (MIRALAX / GLYCOLAX) packet Take 17 g by mouth 2 (two) times daily.    Yes [provider]  sevelamer carbonate (RENVELA) 800 MG tablet Take 2,400 mg by mouth 3 (three) times daily with meals.    Yes [provider]  traMADol (ULTRAM) 50 MG tablet Take 1 tablet (  50 mg total) by mouth every 6 (six) hours as needed. Patient taking differently: Take 50 mg by mouth every 6 (six) hours as needed for moderate pain or severe pain.  08/14/18 08/14/19 Yes [name], MD      PHYSICAL EXAMINATION:   VITAL SIGNS: Blood pressure (!) 146/79, pulse 86, temperature 98.6 F (37 C), temperature source Oral, resp. rate 18, height 5\' 10"  (1.778 m), weight 113 kg, SpO2 96 %.  GENERAL:  57 y.o.-year-old patient lying in the bed with no acute distress.  EYES: Pupils equal, round, reactive to light and accommodation. No scleral icterus. Extraocular muscles intact.  HEENT: Head atraumatic, normocephalic. Oropharynx and nasopharynx  clear.  NECK:  Supple, no jugular venous distention. No thyroid enlargement, no tenderness.  LUNGS: Normal breath sounds bilaterally, no wheezing, rales,rhonchi or crepitation. No use of accessory muscles of respiration.  CARDIOVASCULAR: S1, S2 normal. No murmurs, rubs, or gallops.  ABDOMEN: Soft, nontender, nondistended. Bowel sounds present. No organomegaly or mass.  EXTREMITIES: No pedal edema, cold right leg and foot.  Left side status post below-knee amputation and right side transmetatarsal amputation. NEUROLOGIC: Cranial nerves II through XII are intact. Muscle strength 4/5 in all extremities. Sensation intact. Gait not checked.  PSYCHIATRIC: The patient is alert and oriented x 3.  SKIN: No obvious rash, lesion, or ulcer.   LABORATORY PANEL:   CBC Recent Labs  Lab 09/03/18 1600  WBC 13.4*  HGB 13.2  HCT 43.0  PLT 309  MCV 97.3  MCH 29.9  MCHC 30.7  RDW 16.2*  LYMPHSABS 1.3  MONOABS 1.3*  EOSABS 0.2  BASOSABS 0.1   ------------------------------------------------------------------------------------------------------------------  Chemistries  Recent Labs  Lab 09/03/18 1600  NA 136  K 5.7*  CL 93*  CO2 28  GLUCOSE 228*  BUN 25*  CREATININE 5.27*  CALCIUM 9.1   ------------------------------------------------------------------------------------------------------------------ estimated creatinine clearance is 19.2 mL/min (A) (by C-G formula based on SCr of 5.27 mg/dL (H)). ------------------------------------------------------------------------------------------------------------------ No results for input(s): TSH, T4TOTAL, T3FREE, THYROIDAB in the last 72 hours.  Invalid input(s): FREET3   Coagulation profile Recent Labs  Lab 09/03/18 1607  INR 1.16   ------------------------------------------------------------------------------------------------------------------- No results for input(s): DDIMER in the last 72  hours. -------------------------------------------------------------------------------------------------------------------  Cardiac Enzymes No results for input(s): CKMB, TROPONINI, MYOGLOBIN in the last 168 hours.  Invalid input(s): CK ------------------------------------------------------------------------------------------------------------------ Invalid input(s): POCBNP  ---------------------------------------------------------------------------------------------------------------  Urinalysis No results found for: COLORURINE, APPEARANCEUR, LABSPEC, PHURINE, GLUCOSEU, HGBUR, BILIRUBINUR, KETONESUR, PROTEINUR, UROBILINOGEN, NITRITE, LEUKOCYTESUR   RADIOLOGY: No results found.  EKG: Orders placed or performed during the hospital encounter of 05/05/16  . EKG 12-Lead  . EKG 12-Lead  . ED EKG  . ED EKG    IMPRESSION AND PLAN:  *Ischemic limb- RLL Peripheral arterial atherosclerosis secondary to diabetes Amputation status on left side below-knee amputation and right side transmetatarsal.  Currently IV heparin drip. Vascular surgery to follow. Patient and family had refused for amputation in the past. Continue aspirin, hold Plavix for now.  *Diabetes mellitus with complications of atherosclerosis and peripheral arteries I will hold oral meds and keep on sliding scale coverage.  *End-stage renal disease on hemodialysis Nephrologist to help with continuing dialysis in the hospital. Continue supplemental medications as per nephrologist.  *Hyperkalemia Patient is a dialysis patient and I would like to nephrologist handle this.  All the records are reviewed and case discussed with ED provider. Management plans discussed with the patient, family and they are in agreement.  CODE STATUS: Full code. Code Status History  Date Active Date Inactive Code Status Order ID Comments User Context   08/12/2018 1629 08/14/2018 1324 Full Code [id number]  [name], MD ED   07/24/2018  1246 07/24/2018 1759 Full Code [id number]  [name], MD Inpatient   01/09/2017 1135 01/09/2017 1531 Full Code [id number]  [name], MD Inpatient   04/28/2016 1034 05/02/2016 2139 Full Code [id number]  Flora Lipps, MD Inpatient   04/25/2016 1340 04/28/2016 1034 Full Code [id number]  Schnier, Dolores Lory, MD Inpatient     Wife was present in the room.  Plan also discussed with vascular surgery. Spanish interpreter help in communicating with patient and family.  TOTAL TIME TAKING CARE OF THIS PATIENT: 45 minutes.    Vaughan Basta M.D on 09/03/2018   Between 7am to 6pm - Pager - [phone]  After 6pm go to [email or web address] - password [email or web address] [facility]  Office  [phone]  CC: Primary care physician; [name], MD   Note: This dictation was prepared with Dragon dictation along with smaller phrase technology. Any transcriptional errors that result from this process are unintentional.

## 2018-09-03 NOTE — ED Notes (Signed)
Pt had surgery October 30th to remove toes from right foot due to ulcers and poor circulation - Monday pt started by stating that foot and leg were cold then Monday night the lower 1/2 calf turned blue/black - at this time the lower 1/2 of right calf is blue/black and mottled - the extremity is cool to touch and there are no pedals pulses - Dr Clearnce Hasten at bedside with interpreter (pt speaks no [place]) - pt had dialysis today and pt is also a left below the knee amputation - the pt and his family are requesting "a balloon be placed to open up the veins" - the pt did not want to come to the hospital because he states he was told that if this surgery did not work that he would need to have his leg amputated

## 2018-09-03 NOTE — ED Provider Notes (Addendum)
[facility] Emergency Department Provider Note  ___________________________________________   First MD Initiated Contact with Patient 09/03/18 1550     (approximate)  I have reviewed the triage vital signs and the nursing notes.   HISTORY  Chief Complaint No chief complaint on file.   HPI [name] is a 57 y.o. male with a history of chronic kidney disease, diabetes as well as peripheral arterial disease was presented emergency department the cold and blue right lower extremity.  The patient, several weeks ago, had a balloon angioplasty and thrombectomy and says that his symptoms are improving.  His wife states that he was "growing new skin."  Warmth also return to the leg.  However, over the past 3 days the leg has started to become blue and cold again.  The patient is not complaining any pain at this time.  Came to the emergency department for further evaluation and treatment.  Several weeks ago and the patient complained of the same symptoms he required a thrombectomy with balloon angioplasty.  In the emergency department he was placed on heparin.  Last dialysis just earlier today.   Past Medical History:  Diagnosis Date  . Anemia   . Atherosclerosis   . Chronic kidney disease   . Diabetes mellitus without complication ([place])   . Hypercholesteremia   . Hypertension   . Hypotension   . Stroke [facility])     Patient Active Problem List   Diagnosis Date Noted  . Arterial occlusion 08/12/2018  . ESRD on dialysis ([place]) 11/09/2016  . Diabetes ([place]) 11/09/2016  . PVD (peripheral vascular disease) ([place]) 11/09/2016  . Cardiac arrest ([place]) 04/28/2016  . Atherosclerosis of extremity with gangrene ([place]) 04/25/2016  . Diabetic polyneuropathy associated with type 2 diabetes mellitus ([place]) 12/09/2015  . Diabetic ulcer of right foot associated with type 2 diabetes mellitus ([place]) 12/09/2015  . Anemia 03/24/2014  . Left foot burn 03/17/2014  . Leukocytosis  03/17/2014  . Obesity 03/17/2014  . PND (paroxysmal nocturnal dyspnea) 06/29/2013  . Volume overload 06/29/2013  . Diastolic congestive heart failure, NYHA class 2 ([place]) 06/28/2013  . Shortness of breath 06/28/2013  . After-cataract with vision obscured 08/01/2011  . Anisometropia 08/01/2011  . Background diabetic retinopathy ([place]) 08/01/2011  . Senile nuclear sclerosis 08/01/2011    Past Surgical History:  Procedure Laterality Date  . A/V FISTULAGRAM Left 01/09/2017   Procedure: A/V Fistulagram;  Surgeon: [name], MD;  Location: [place] [facility];  Service: Cardiovascular;  Laterality: Left;  . A/V FISTULAGRAM Left 06/06/2017   Procedure: A/V Fistulagram;  Surgeon: [name], MD;  Location: [place] [facility];  Service: Cardiovascular;  Laterality: Left;  . A/V FISTULAGRAM Left 08/15/2017   Procedure: A/V Fistulagram;  Surgeon: [name], MD;  Location: [place] [facility];  Service: Cardiovascular;  Laterality: Left;  . A/V FISTULAGRAM Left 01/22/2018   Procedure: A/V FISTULAGRAM;  Surgeon: [name], MD;  Location: [place] [facility];  Service: Cardiovascular;  Laterality: Left;  . A/V SHUNT INTERVENTION N/A 06/06/2017   Procedure: A/V SHUNT INTERVENTION;  Surgeon: [name], MD;  Location: [place] [facility];  Service: Cardiovascular;  Laterality: N/A;  . AMPUTATION Right 04/25/2016   Procedure: TOE AMPUTATION;  Surgeon: Katha Cabal, MD;  Location: [facility] ORS;  Service: Vascular;  Laterality: Right;  . APPLICATION OF WOUND VAC Right 04/25/2016   Procedure: APPLICATION OF WOUND VAC;  Surgeon: Katha Cabal, MD;  Location: [facility]  ORS;  Service: Vascular;  Laterality: Right;  . AV FISTULA PLACEMENT Left 2015  . CARDIAC CATHETERIZATION N/A 04/30/2016   Procedure: Left Heart Cath and Coronary Angiography;  Surgeon: Wellington Hampshire, MD;  Location: [place] [facility];  Service: Cardiovascular;  Laterality: N/A;  . CATARACT EXTRACTION, BILATERAL    . LEG  AMPUTATION BELOW KNEE Left   . LOWER EXTREMITY ANGIOGRAPHY Right 07/24/2018   Procedure: LOWER EXTREMITY ANGIOGRAPHY;  Surgeon: [name], MD;  Location: [place] [facility];  Service: Cardiovascular;  Laterality: Right;  . LOWER EXTREMITY ANGIOGRAPHY Right 08/13/2018   Procedure: LOWER EXTREMITY ANGIOGRAPHY;  Surgeon: [name], MD;  Location: [place] [facility];  Service: Cardiovascular;  Laterality: Right;  . PERIPHERAL VASCULAR CATHETERIZATION N/A 10/03/2015   Procedure: A/V Shuntogram/Fistulagram;  Surgeon: [name], MD;  Location: [place] [facility];  Service: Cardiovascular;  Laterality: N/A;  . PERIPHERAL VASCULAR CATHETERIZATION N/A 01/16/2016   Procedure: Abdominal Aortogram w/Lower Extremity;  Surgeon: [name], MD;  Location: [place] [facility];  Service: Cardiovascular;  Laterality: N/A;  . PERIPHERAL VASCULAR CATHETERIZATION  01/16/2016   Procedure: Lower Extremity Intervention;  Surgeon: [name], MD;  Location: [place] CV LAB;  Service: Cardiovascular;;  . PERIPHERAL VASCULAR CATHETERIZATION Right 04/27/2016   Procedure: Lower Extremity Angiography;  Surgeon: Katha Cabal, MD;  Location: [place] [facility];  Service: Cardiovascular;  Laterality: Right;  . PERIPHERAL VASCULAR CATHETERIZATION  04/27/2016   Procedure: Lower Extremity Intervention;  Surgeon: Katha Cabal, MD;  Location: [place] [facility] LAB;  Service: Cardiovascular;;  . TRANSMETATARSAL AMPUTATION Right 04/28/2016   Procedure: TRANSMETATARSAL AMPUTATION;  Surgeon: Albertine Patricia, DPM;  Location: [facility] ORS;  Service: Podiatry;  Laterality: Right;  . WOUND DEBRIDEMENT Right 04/25/2016   Procedure: DEBRIDEMENT WOUND;  Surgeon: Katha Cabal, MD;  Location: [facility] ORS;  Service: Vascular;  Laterality: Right;    Prior to Admission medications   Medication Sig Start Date End Date Taking? Authorizing Provider  b complex-vitamin c-folic acid (NEPHRO-VITE) 0.8 MG TABS tablet Take 1 tablet  by mouth daily.    [provider]  bisacodyl (DULCOLAX) 5 MG EC tablet Take 20 mg by mouth daily as needed for moderate constipation.    [provider]  cinacalcet (SENSIPAR) 60 MG tablet Take 60 mg by mouth daily.     [provider]  clopidogrel (PLAVIX) 75 MG tablet Take 1 tablet (75 mg total) by mouth daily. 07/24/18   [name], MD  ferrous sulfate 325 (65 FE) MG EC tablet Take 325 mg by mouth 3 (three) times daily with meals.    [provider]  glipiZIDE (GLUCOTROL) 10 MG tablet Take 10 mg by mouth daily.  10/25/16   [provider]  lidocaine-prilocaine (EMLA) cream Apply 1 application topically as needed (port access).    [provider]  midodrine (PROAMATINE) 10 MG tablet Take 5-10 mg by mouth See admin instructions. Take 1-2 tablets (5-10MG ) by mouth during dialysis as needed for low blood pressure    [provider]  omeprazole (PRILOSEC) 40 MG capsule Take 40 mg by mouth 2 (two) times daily.     [provider]  polyethylene glycol (MIRALAX / GLYCOLAX) packet Take 17 g by mouth daily as needed for mild constipation.    [provider]  sevelamer carbonate (RENVELA) 800 MG tablet Take 2,400 mg by mouth 3 (three) times daily with meals.     [provider]  traMADol (ULTRAM) 50 MG tablet Take 1 tablet (50 mg total) by mouth every 6 (six) hours as needed. 08/14/18 08/14/19  [name], MD    Allergies Patient has no known allergies.  Family History  Problem Relation Age of Onset  . Diabetes Father   . Prostate cancer Father     Social History Social History   Tobacco Use  . Smoking status: Never Smoker  . Smokeless tobacco: Never Used  Substance Use Topics  . Alcohol use: No  . Drug use: No    Review of Systems  Constitutional: No fever/chills Eyes: No visual changes. ENT: No sore throat. Cardiovascular: Denies chest pain. Respiratory: Denies shortness of  breath. Gastrointestinal: No abdominal pain.  No nausea, no vomiting.  No diarrhea.  No constipation. Genitourinary: Negative for dysuria. Musculoskeletal: Negative for back pain. Skin: Negative for rash. Neurological: Negative for headaches, focal weakness or numbness.   ____________________________________________   PHYSICAL EXAM:  VITAL SIGNS: ED Triage Vitals  Enc Vitals Group     BP 09/03/18 1520 108/63     Pulse Rate 09/03/18 1520 91     Resp 09/03/18 1520 20     Temp 09/03/18 1520 98.5 F (36.9 C)     Temp Source 09/03/18 1520 Oral     SpO2 09/03/18 1520 97 %     Weight 09/03/18 1538 249 lb 1.9 oz (113 kg)     Height 09/03/18 1538 5\' 10"  (1.778 m)     Head Circumference --      Peak Flow --      Pain Score 09/03/18 1537 5     Pain Loc --      Pain Edu? --      Excl. in [place]? --     Constitutional: Alert and oriented. Well appearing and in no acute distress. Eyes: Conjunctivae are normal.  Head: Atraumatic. Nose: No congestion/rhinnorhea. Mouth/Throat: Mucous membranes are moist.  Neck: No stridor.   Cardiovascular: Normal rate, regular rhythm. Grossly normal heart sounds.  A palpable thrill to the fistula to the left upper extremity. Respiratory: Normal respiratory effort.  No retractions. Lungs CTAB. Gastrointestinal: Soft and nontender. No distention.  Musculoskeletal: Left lower extremity BKA.  Right lower extremity is cold and mottled starting from the proximal calf down to the foot.  There are no toes on the right foot.  I am unable to palpate a dorsalis pedis nor posterior tibial pulse.  There is also a small, black, gangrenous appearing area about 2 to 3 cm in maximum width on the posterior of the right calf.  There does not appear to be any skin breakdown, exudate.  Neurologic:  Normal speech and language. No gross focal neurologic deficits are appreciated. Skin:  Skin is warm, dry and intact. No rash noted. Psychiatric: Mood and affect are normal. Speech  and behavior are normal.  ____________________________________________   LABS (all labs ordered are listed, but only abnormal results are displayed)  Labs Reviewed  CBC WITH DIFFERENTIAL/PLATELET  BASIC METABOLIC PANEL  LACTIC ACID, PLASMA  LACTIC ACID, PLASMA  I-STAT CG4 LACTIC ACID, ED  CG4 I-STAT (LACTIC ACID)   ____________________________________________  EKG   ____________________________________________  RADIOLOGY   ____________________________________________   PROCEDURES  Procedure(s) performed:   .Critical Care Performed by: Orbie Pyo, MD Authorized by: Orbie Pyo, MD   Critical care provider statement:    Critical care time (minutes):  35   Critical care time was exclusive of:  Separately billable procedures and  treating other patients   Critical care was necessary to treat or prevent imminent or life-threatening deterioration of the following conditions: limb threatening illness.   Critical care was time spent personally by me on the following activities:  Development of treatment plan with patient or surrogate, discussions with consultants, evaluation of patient's response to treatment, examination of patient, obtaining history from patient or surrogate, ordering and performing treatments and interventions, ordering and review of laboratory studies, ordering and review of radiographic studies, pulse oximetry, re-evaluation of patient's condition and review of old charts   Critical Care performed:    ____________________________________________   INITIAL IMPRESSION / [place] / ED COURSE  Pertinent labs & imaging results that were available during my care of the patient were reviewed by me and considered in my medical decision making (see chart for details).  DDX: Arterial occlusion, cellulitis, gangrene, ischemic limb, sepsis As part of my medical decision making, I reviewed the following data within the  [place] Notes from prior ED visits  ----------------------------------------- 4:14 PM on 09/03/2018 -----------------------------------------  Discussed case with Dr. Nino Parsley assistant in the operating room says he will be taking messages for Dr. Lucky Cowboy who is in surgery at this time.  Patient to be evaluated by vascular surgery service.  I will place the patient on heparin and admit to the hospitalist service.  He was updated regarding this plan and is understanding and willing to comply.  Spanish interpreter, Sherre Scarlet, at the bedside for interpretation during all interactions.  Patient be admitted to the hospitalist.  Signed out to Dr. Verdell Carmine. ____________________________________________   FINAL CLINICAL IMPRESSION(S) / ED DIAGNOSES  Ischemic limb  NEW MEDICATIONS STARTED DURING THIS VISIT:  New Prescriptions   No medications on file     Note:  This document was prepared using Dragon voice recognition software and may include unintentional dictation errors.     Orbie Pyo, MD 09/03/18 1615    Clearnce Hasten Randall An, MD 09/03/18 [phone]

## 2018-09-03 NOTE — Consult Note (Signed)
ANTICOAGULATION CONSULT NOTE - Initial Consult  Pharmacy Consult for heparin infusion Indication: ischemic limb  No Known Allergies  Patient Measurements: Height: 5\' 10"  (177.8 cm) Weight: 249 lb 1.9 oz (113 kg) IBW/kg (Calculated) : 73 Heparin Dosing Weight: 97.8  Vital Signs: Temp: 98.6 F (37 C) (11/20 1652) Temp Source: Oral (11/20 1652) BP: 146/79 (11/20 1652) Pulse Rate: 86 (11/20 1652)  Labs: Recent Labs    09/03/18 1600  HGB 13.2  HCT 43.0  PLT 309  CREATININE 5.27*    Estimated Creatinine Clearance: 19.2 mL/min (A) (by C-G formula based on SCr of 5.27 mg/dL (H)).   Medical History: Past Medical History:  Diagnosis Date  . Anemia   . Atherosclerosis   . Chronic kidney disease   . Diabetes mellitus without complication ([place])   . Hypercholesteremia   . Hypertension   . Hypotension   . Stroke [facility])     Medications:   (Not in a hospital admission)  Assessment: [name] is a 57 y.o. male with a history of chronic kidney disease, diabetes as well as peripheral arterial disease was presented emergency department the cold and blue right lower extremity.  The patient, several weeks ago, had a balloon angioplasty and thrombectomy and says that his symptoms are improving.  His wife states that he was "growing new skin."  Warmth also return to the leg.  However, over the past 3 days the leg has started to become blue and cold again.  The patient is not complaining any pain at this time.  Came to the emergency department for further evaluation and treatment. Per chart review no history of PTA anticoagulation.  Goal of Therapy:  Heparin level 0.3-0.7 units/ml Monitor platelets by anticoagulation protocol: Yes   Plan:  Give 4900 units bolus x 1 Start heparin infusion at 1600 units/hr Check heparin level every 8 hours until 2 consecutive therapeutic levels then daily and CBC daily while on heparin Continue to monitor H&H and platelets  Forrest Moron,  PharmD Clinical Pharmacist 09/03/2018,5:01 PM

## 2018-09-03 NOTE — Consult Note (Signed)
[facility] Vascular Consult Note  MRN : [id number]  [name] is a 57 y.o. (06-07-1961) male who presents with chief complaint of No chief complaint on file. Marland Kitchen  History of Present Illness:   I am asked to evaluate [name] by Dr. Anselm Jungling.  Patient is a 57 year old gentleman well-known to our service who has a long-standing history of atherosclerotic occlusive disease particularly of the right lower extremity that has been associated with multiple open wounds which are nonhealing.  He is undergone several attempts at revascularization which have not improved his arterial perfusion to the point that he has been able to heal his wounds.  Today he presents to the emergency room with worsening of his right foot increasing coldness and color change per the patient's wife.  He denies true pain but is quite neuropathic.  He does find the coldness to be concerning and bothersome to him.  Current Meds  Medication Sig  . aspirin EC 81 MG tablet Take 81 mg by mouth daily.  Marland Kitchen b complex-vitamin c-folic acid (NEPHRO-VITE) 0.8 MG TABS tablet Take 1 tablet by mouth daily.  . cinacalcet (SENSIPAR) 60 MG tablet Take 60 mg by mouth daily.   . clopidogrel (PLAVIX) 75 MG tablet Take 1 tablet (75 mg total) by mouth daily.  . ferrous sulfate 325 (65 FE) MG EC tablet Take 325 mg by mouth 3 (three) times daily with meals.  Marland Kitchen glipiZIDE (GLUCOTROL) 10 MG tablet Take 10 mg by mouth daily.   Marland Kitchen lidocaine-prilocaine (EMLA) cream Apply 1 application topically as needed (port access).  Marland Kitchen omeprazole (PRILOSEC) 40 MG capsule Take 40 mg by mouth 2 (two) times daily.   . polyethylene glycol (MIRALAX / GLYCOLAX) packet Take 17 g by mouth 2 (two) times daily.   . sevelamer carbonate (RENVELA) 800 MG tablet Take 2,400 mg by mouth 3 (three) times daily with meals.   . traMADol (ULTRAM) 50 MG tablet Take 1 tablet (50 mg total) by mouth every 6 (six) hours as needed. (Patient taking differently: Take  50 mg by mouth every 6 (six) hours as needed for moderate pain or severe pain. )    Past Medical History:  Diagnosis Date  . Anemia   . Atherosclerosis   . Chronic kidney disease   . Diabetes mellitus without complication ([place])   . Hypercholesteremia   . Hypertension   . Hypotension   . Stroke [facility])     Past Surgical History:  Procedure Laterality Date  . A/V FISTULAGRAM Left 01/09/2017   Procedure: A/V Fistulagram;  Surgeon: [name], MD;  Location: [place] [facility];  Service: Cardiovascular;  Laterality: Left;  . A/V FISTULAGRAM Left 06/06/2017   Procedure: A/V Fistulagram;  Surgeon: [name], MD;  Location: [place] [facility];  Service: Cardiovascular;  Laterality: Left;  . A/V FISTULAGRAM Left 08/15/2017   Procedure: A/V Fistulagram;  Surgeon: [name], MD;  Location: [place] [facility];  Service: Cardiovascular;  Laterality: Left;  . A/V FISTULAGRAM Left 01/22/2018   Procedure: A/V FISTULAGRAM;  Surgeon: [name], MD;  Location: [place] [facility];  Service: Cardiovascular;  Laterality: Left;  . A/V SHUNT INTERVENTION N/A 06/06/2017   Procedure: A/V SHUNT INTERVENTION;  Surgeon: [name], MD;  Location: [place] [facility];  Service: Cardiovascular;  Laterality: N/A;  . AMPUTATION Right 04/25/2016   Procedure: TOE AMPUTATION;  Surgeon: Katha Cabal, MD;  Location: [facility];  Service: Vascular;  Laterality: Right;  . APPLICATION OF WOUND VAC Right 04/25/2016   Procedure: APPLICATION OF WOUND VAC;  Surgeon: Katha Cabal, MD;  Location: [facility];  Service: Vascular;  Laterality: Right;  . AV FISTULA PLACEMENT Left 2015  . CARDIAC CATHETERIZATION N/A 04/30/2016   Procedure: Left Heart Cath and Coronary Angiography;  Surgeon: Wellington Hampshire, MD;  Location: [place] [facility];  Service: Cardiovascular;  Laterality: N/A;  . CATARACT EXTRACTION, BILATERAL    . LEG AMPUTATION BELOW KNEE Left   . LOWER EXTREMITY ANGIOGRAPHY Right 07/24/2018    Procedure: LOWER EXTREMITY ANGIOGRAPHY;  Surgeon: [name], MD;  Location: [place] [facility];  Service: Cardiovascular;  Laterality: Right;  . LOWER EXTREMITY ANGIOGRAPHY Right 08/13/2018   Procedure: LOWER EXTREMITY ANGIOGRAPHY;  Surgeon: [name], MD;  Location: [place] [facility];  Service: Cardiovascular;  Laterality: Right;  . PERIPHERAL VASCULAR CATHETERIZATION N/A 10/03/2015   Procedure: A/V Shuntogram/Fistulagram;  Surgeon: [name], MD;  Location: [place] [facility];  Service: Cardiovascular;  Laterality: N/A;  . PERIPHERAL VASCULAR CATHETERIZATION N/A 01/16/2016   Procedure: Abdominal Aortogram w/Lower Extremity;  Surgeon: [name], MD;  Location: [place] [facility];  Service: Cardiovascular;  Laterality: N/A;  . PERIPHERAL VASCULAR CATHETERIZATION  01/16/2016   Procedure: Lower Extremity Intervention;  Surgeon: [name], MD;  Location: [place] [facility];  Service: Cardiovascular;;  . PERIPHERAL VASCULAR CATHETERIZATION Right 04/27/2016   Procedure: Lower Extremity Angiography;  Surgeon: Katha Cabal, MD;  Location: [place] [facility];  Service: Cardiovascular;  Laterality: Right;  . PERIPHERAL VASCULAR CATHETERIZATION  04/27/2016   Procedure: Lower Extremity Intervention;  Surgeon: Katha Cabal, MD;  Location: [place] [facility];  Service: Cardiovascular;;  . TRANSMETATARSAL AMPUTATION Right 04/28/2016   Procedure: TRANSMETATARSAL AMPUTATION;  Surgeon: Albertine Patricia, DPM;  Location: [facility];  Service: Podiatry;  Laterality: Right;  . WOUND DEBRIDEMENT Right 04/25/2016   Procedure: DEBRIDEMENT WOUND;  Surgeon: Katha Cabal, MD;  Location: [facility];  Service: Vascular;  Laterality: Right;    Social History Social History   Tobacco Use  . Smoking status: Never Smoker  . Smokeless tobacco: Never Used  Substance Use Topics  . Alcohol use: No  . Drug use: No    Family History Family History  Problem Relation Age of Onset  . Diabetes  Father   . Prostate cancer Father   No family history of bleeding/clotting disorders, porphyria or autoimmune disease  No Known Allergies   REVIEW OF SYSTEMS (Negative unless checked)  Constitutional: [] Weight loss  [] Fever  [] Chills Cardiac: [] Chest pain   [] Chest pressure   [] Palpitations   [] Shortness of breath when laying flat   [] Shortness of breath at rest   [] Shortness of breath with exertion. Vascular:  [] Pain in legs with walking   [] Pain in legs at rest   [] Pain in legs when laying flat   [] Claudication   [] Pain in feet when walking  [] Pain in feet at rest  [] Pain in feet when laying flat   [] History of DVT   [] Phlebitis   [] Swelling in legs   [] Varicose veins   [x] Non-healing ulcers Pulmonary:   [] Uses home oxygen   [] Productive cough   [] Hemoptysis   [] Wheeze  [] COPD   [] Asthma Neurologic:  [] Dizziness  [] Blackouts   [] Seizures   [] History of stroke   [] History of TIA  [] Aphasia   [] Temporary blindness   [] Dysphagia   [] Weakness or numbness in arms   []   Weakness or numbness in legs Musculoskeletal:  [] Arthritis   [] Joint swelling   [] Joint pain   [] Low back pain Hematologic:  [] Easy bruising  [] Easy bleeding   [] Hypercoagulable state   [] Anemic  [] Hepatitis Gastrointestinal:  [] Blood in stool   [] Vomiting blood  [] Gastroesophageal reflux/heartburn   [] Difficulty swallowing. Genitourinary:  [x] Chronic kidney disease   [] Difficult urination  [] Frequent urination  [] Burning with urination   [] Blood in urine Skin:  [x] Rashes   [x] Ulcers   [x] Wounds Psychological:  [] History of anxiety   []  History of major depression.    Physical Examination  Vitals:   09/03/18 1520 09/03/18 1538 09/03/18 1652  BP: 108/63  (!) 146/79  Pulse: 91  86  Resp: 20  18  Temp: 98.5 F (36.9 C)  98.6 F (37 C)  TempSrc: Oral  Oral  SpO2: 97%  96%  Weight:  113 kg   Height:  5\' 10"  (1.778 m)    Body mass index is 35.74 kg/m.  Head: [place]/AT, No temporalis wasting. Prominent temp pulse not noted.   Moon facies noted Ear/Nose/Throat: Nares w/o erythema or drainage, oropharynx w/o obsrtuction, Mallampati score: Class IV.  Dentition poor.  Eyes: PERRLA, Sclera nonicteric.  Neck: Supple, no nuchal rigidity.  No bruit or JVD.  Pulmonary:  Breath sounds equal bilaterally, no use of accessory muscles.  Cardiac: RRR, normal S1, S2, no Murmurs, rubs or gallops. Vascular: Right leg demonstrates multiple ulcers his foot status post TMA today on presentation to the emergency room it is mottled and cold consistent with severe ischemia pedal pulses are nonpalpable bilaterally Gastrointestinal: soft, non-tender, non-distended.  Musculoskeletal: Moves all extremities.  Right and left foot deformity. No edema. Neurologic: CN 2-12 intact. Symmetrical.  Speech is fluent.  Psychiatric: Judgment intact, Mood & affect appropriate for pt's clinical situation. Dermatologic: Mottling multiple ulcers noted.  No cellulitis of the wounds. Lymph : No Cervical,  or Inguinal lymphadenopathy.      CBC Lab Results  Component Value Date   WBC 13.4 (H) 09/03/2018   HGB 13.2 09/03/2018   HCT 43.0 09/03/2018   MCV 97.3 09/03/2018   PLT 309 09/03/2018    BMET    Component Value Date/Time   NA 136 09/03/2018 1600   K 5.7 (H) 09/03/2018 1600   K 6.6 (HH) 03/10/2013 1351   CL 93 (L) 09/03/2018 1600   CO2 28 09/03/2018 1600   GLUCOSE 228 (H) 09/03/2018 1600   BUN 25 (H) 09/03/2018 1600   CREATININE 5.27 (H) 09/03/2018 1600   CALCIUM 9.1 09/03/2018 1600   GFRNONAA 11 (L) 09/03/2018 1600   GFRAA 13 (L) 09/03/2018 1600   Estimated Creatinine Clearance: 19.2 mL/min (A) (by C-G formula based on SCr of 5.27 mg/dL (H)).  COAG Lab Results  Component Value Date   INR 1.16 09/03/2018   INR 0.96 08/12/2018   INR 1.47 04/30/2016    Radiology Images of previous angiogram dated August 13, 2018 are reviewed by me personally.  They demonstrate good inflow but his distal outflow is quite  disadvantaged.   Assessment/Plan 1.  Ischemia right lower extremity: I have reviewed the patient's arterial imaging as well as his frequent recurrence of ischemia.  Today his foot appears to be significantly worse it is more severely mottled than previous encounters.  I have discussed amputation which would be at the above-knee level.  The family is unwilling to undergo the surgery or consider this.  They have demanded that we try limb salvage one more time but  do concede after lengthy discussion that if this does not work they will agreed to move forward with amputation.  I will arrange for angiography of the right lower extremity with possible intervention by Dr. Lucky Cowboy for tomorrow.  In the meantime he will be maintained on a heparin drip.  This can be stopped on call to angios tomorrow.  Pain control does not appear to be a significant issue given his dense neuropathy.  2.  End-stage renal disease on hemodialysis: Patient will continue hemodialysis while in-house.  We will try to avoid interruptions in his dialysis routine.  We will limit IV fluids.  3.  Diabetes mellitus: Continue hypoglycemic medications as already ordered, these medications have been reviewed and there are no changes at this time.  Hgb A1C to be monitored as already arranged by primary service    Hortencia Pilar, MD  09/03/2018 6:44 PM

## 2018-09-03 NOTE — ED Notes (Signed)
Pt is resting in bed. Respirations even/unlabored.Wife at bedside. NADN.

## 2018-09-03 NOTE — ED Triage Notes (Signed)
Triage done with interpreter.  State R leg is cold.  Dialysis pt. Hx DM.  Was supposed to have R leg amputated. States doctor told pt that there is no blood flow to leg.

## 2018-09-03 NOTE — ED Notes (Signed)
Brittany RN, aware of bed assigned  

## 2018-09-04 ENCOUNTER — Encounter: Payer: Self-pay | Admitting: Anesthesiology

## 2018-09-04 ENCOUNTER — Other Ambulatory Visit (INDEPENDENT_AMBULATORY_CARE_PROVIDER_SITE_OTHER): Payer: Self-pay | Admitting: Vascular Surgery

## 2018-09-04 ENCOUNTER — Encounter
Admission: EM | Disposition: A | Discharge status: Skilled Nursing Facility | Payer: Self-pay | Source: Home / Self Care | Attending: Internal Medicine

## 2018-09-04 DIAGNOSIS — I70261 Atherosclerosis of native arteries of extremities with gangrene, right leg: Secondary | ICD-10-CM

## 2018-09-04 HISTORY — PX: LOWER EXTREMITY ANGIOGRAPHY: CATH118251

## 2018-09-04 LAB — CBC
HCT: 40.1 % (ref 39.0–52.0)
HEMATOCRIT: 38.5 % — AB (ref 39.0–52.0)
HEMOGLOBIN: 11.7 g/dL — AB (ref 13.0–17.0)
Hemoglobin: 12.1 g/dL — ABNORMAL LOW (ref 13.0–17.0)
MCH: 29.5 pg (ref 26.0–34.0)
MCH: 29.5 pg (ref 26.0–34.0)
MCHC: 30.2 g/dL (ref 30.0–36.0)
MCHC: 30.4 g/dL (ref 30.0–36.0)
MCV: 97.8 fL (ref 80.0–100.0)
MCV: 97.2 fL (ref 80.0–100.0)
NRBC: 0 % (ref 0.0–0.2)
Platelets: 277 10*3/uL (ref 150–400)
Platelets: 272 10*3/uL (ref 150–400)
RBC: 4.1 MIL/uL — AB (ref 4.22–5.81)
RBC: 3.96 MIL/uL — ABNORMAL LOW (ref 4.22–5.81)
RDW: 16.2 % — ABNORMAL HIGH (ref 11.5–15.5)
RDW: 16.1 % — ABNORMAL HIGH (ref 11.5–15.5)
WBC: 11.2 10*3/uL — ABNORMAL HIGH (ref 4.0–10.5)
WBC: 12 10*3/uL — AB (ref 4.0–10.5)
nRBC: 0 % (ref 0.0–0.2)

## 2018-09-04 LAB — BASIC METABOLIC PANEL
ANION GAP: 12 (ref 5–15)
BUN: 35 mg/dL — ABNORMAL HIGH (ref 6–20)
CO2: 29 mmol/L (ref 22–32)
CREATININE: 6.26 mg/dL — AB (ref 0.61–1.24)
Calcium: 9.1 mg/dL (ref 8.9–10.3)
Chloride: 96 mmol/L — ABNORMAL LOW (ref 98–111)
GFR, EST AFRICAN AMERICAN: 10 mL/min — AB (ref >60)
GFR, EST NON AFRICAN AMERICAN: 9 mL/min — AB (ref >60)
GLUCOSE: 168 mg/dL — AB (ref 70–99)
Potassium: 5.3 mmol/L — ABNORMAL HIGH (ref 3.5–5.1)
Sodium: 137 mmol/L (ref 135–145)

## 2018-09-04 LAB — GLUCOSE, CAPILLARY
GLUCOSE-CAPILLARY: 144 mg/dL — AB (ref 70–99)
GLUCOSE-CAPILLARY: 162 mg/dL — AB (ref 70–99)
Glucose-Capillary: 144 mg/dL — ABNORMAL HIGH (ref 70–99)
Glucose-Capillary: 131 mg/dL — ABNORMAL HIGH (ref 70–99)
Glucose-Capillary: 167 mg/dL — ABNORMAL HIGH (ref 70–99)

## 2018-09-04 LAB — HEPARIN LEVEL (UNFRACTIONATED)
HEPARIN UNFRACTIONATED: 0.25 [IU]/mL — AB (ref 0.30–0.70)
Heparin Unfractionated: 0.24 IU/mL — ABNORMAL LOW (ref 0.30–0.70)

## 2018-09-04 SURGERY — LOWER EXTREMITY ANGIOGRAPHY
Anesthesia: Moderate Sedation | Laterality: Right

## 2018-09-04 MED ORDER — LIDOCAINE-EPINEPHRINE (PF) 1 %-1:200000 IJ SOLN
INTRAMUSCULAR | Status: AC
  Filled 2018-09-04: qty 10

## 2018-09-04 MED ORDER — CINACALCET HCL 30 MG PO TABS
60.0000 mg | ORAL_TABLET | ORAL | Status: DC
  Administered 2018-09-05 – 2018-09-15 (×4): 60 mg via ORAL
  Filled 2018-09-04 (×5): qty 2

## 2018-09-04 MED ORDER — HEPARIN (PORCINE) IN NACL 1000-0.9 UT/500ML-% IV SOLN
INTRAVENOUS | Status: AC
  Filled 2018-09-04: qty 1000

## 2018-09-04 MED ORDER — MIDAZOLAM HCL 2 MG/2ML IJ SOLN
INTRAMUSCULAR | Status: DC | PRN
  Administered 2018-09-04 (×2): 1 mg via INTRAVENOUS
  Administered 2018-09-04: 2 mg via INTRAVENOUS
  Administered 2018-09-04 (×2): 1 mg via INTRAVENOUS

## 2018-09-04 MED ORDER — FAMOTIDINE 20 MG PO TABS: 40.0000 mg | ORAL_TABLET | ORAL | Status: DC | PRN

## 2018-09-04 MED ORDER — FENTANYL CITRATE (PF) 100 MCG/2ML IJ SOLN
INTRAMUSCULAR | Status: AC
  Filled 2018-09-04: qty 2

## 2018-09-04 MED ORDER — ALTEPLASE 2 MG IJ SOLR
INTRAMUSCULAR | Status: DC | PRN
  Administered 2018-09-04: 8 mg

## 2018-09-04 MED ORDER — HYDROMORPHONE HCL 1 MG/ML IJ SOLN: 1.0000 mg | Freq: Once | INTRAMUSCULAR | Status: DC | PRN

## 2018-09-04 MED ORDER — HYDROMORPHONE HCL 1 MG/ML IJ SOLN
1.0000 mg | Freq: Once | INTRAMUSCULAR | Status: AC
  Administered 2018-09-04: 1 mg via INTRAVENOUS
  Filled 2018-09-04: qty 1

## 2018-09-04 MED ORDER — SODIUM CHLORIDE 0.9 % IV SOLN: INTRAVENOUS | Status: DC

## 2018-09-04 MED ORDER — DEXTROSE 5 % IV SOLN
Freq: Two times a day (BID) | INTRAVENOUS | Status: DC
  Administered 2018-09-04 – 2018-09-08 (×9): via INTRAVENOUS
  Filled 2018-09-04 (×11): qty 1

## 2018-09-04 MED ORDER — ALTEPLASE 2 MG IJ SOLR
INTRAMUSCULAR | Status: AC
  Filled 2018-09-04: qty 8

## 2018-09-04 MED ORDER — NEPRO/CARBSTEADY PO LIQD
237.0000 mL | Freq: Two times a day (BID) | ORAL | Status: DC
  Administered 2018-09-05 – 2018-09-15 (×14): 237 mL via ORAL

## 2018-09-04 MED ORDER — HEPARIN BOLUS VIA INFUSION
1500.0000 [IU] | Freq: Once | INTRAVENOUS | Status: DC
  Filled 2018-09-04: qty 1500

## 2018-09-04 MED ORDER — FENTANYL CITRATE (PF) 100 MCG/2ML IJ SOLN
INTRAMUSCULAR | Status: DC | PRN
  Administered 2018-09-04 (×3): 50 ug via INTRAVENOUS

## 2018-09-04 MED ORDER — MIDAZOLAM HCL 2 MG/2ML IJ SOLN
INTRAMUSCULAR | Status: AC
  Filled 2018-09-04: qty 2

## 2018-09-04 MED ORDER — HEPARIN SODIUM (PORCINE) 1000 UNIT/ML IJ SOLN
INTRAMUSCULAR | Status: DC | PRN
  Administered 2018-09-04: 4000 [IU] via INTRAVENOUS

## 2018-09-04 MED ORDER — MIDAZOLAM HCL 2 MG/ML PO SYRP
ORAL_SOLUTION | ORAL | Status: AC
  Filled 2018-09-04: qty 4

## 2018-09-04 MED ORDER — CEFAZOLIN SODIUM-DEXTROSE 1-4 GM/50ML-% IV SOLN
INTRAVENOUS | Status: AC
  Filled 2018-09-04: qty 50

## 2018-09-04 MED ORDER — OCUVITE-LUTEIN PO CAPS
1.0000 | ORAL_CAPSULE | Freq: Every day | ORAL | Status: DC
  Administered 2018-09-05 – 2018-09-15 (×9): 1 via ORAL
  Filled 2018-09-04 (×11): qty 1

## 2018-09-04 MED ORDER — MIDAZOLAM HCL 2 MG/ML PO SYRP
8.0000 mg | ORAL_SOLUTION | Freq: Once | ORAL | Status: AC
  Administered 2018-09-04: 8 mg via ORAL
  Filled 2018-09-04: qty 4

## 2018-09-04 MED ORDER — ONDANSETRON HCL 4 MG/2ML IJ SOLN
INTRAMUSCULAR | Status: AC
  Filled 2018-09-04: qty 2

## 2018-09-04 MED ORDER — HYDROMORPHONE HCL 1 MG/ML IJ SOLN
INTRAMUSCULAR | Status: AC
  Filled 2018-09-04: qty 0.5

## 2018-09-04 MED ORDER — VITAMIN C 500 MG PO TABS
250.0000 mg | ORAL_TABLET | Freq: Two times a day (BID) | ORAL | Status: DC
  Administered 2018-09-09 – 2018-09-15 (×12): 250 mg via ORAL
  Filled 2018-09-04 (×2): qty 1
  Filled 2018-09-04 (×3): qty 0.5
  Filled 2018-09-04: qty 1
  Filled 2018-09-04: qty 0.5
  Filled 2018-09-04 (×6): qty 1

## 2018-09-04 MED ORDER — HYDROMORPHONE HCL 1 MG/ML IJ SOLN
0.5000 mg | Freq: Once | INTRAMUSCULAR | Status: AC
  Administered 2018-09-04: 0.5 mg via INTRAVENOUS

## 2018-09-04 MED ORDER — IOPAMIDOL (ISOVUE-300) INJECTION 61%
INTRAVENOUS | Status: DC | PRN
  Administered 2018-09-04: 70 mL via INTRA_ARTERIAL

## 2018-09-04 MED ORDER — CEFAZOLIN SODIUM-DEXTROSE 1-4 GM/50ML-% IV SOLN: 1.0000 g | Freq: Once | INTRAVENOUS | Status: DC

## 2018-09-04 MED ORDER — ONDANSETRON HCL 4 MG/2ML IJ SOLN
4.0000 mg | Freq: Four times a day (QID) | INTRAMUSCULAR | Status: DC | PRN
  Administered 2018-09-04 – 2018-09-14 (×4): 4 mg via INTRAVENOUS
  Filled 2018-09-04 (×3): qty 2

## 2018-09-04 MED ORDER — METHYLPREDNISOLONE SODIUM SUCC 125 MG IJ SOLR: 125.0000 mg | INTRAMUSCULAR | Status: DC | PRN

## 2018-09-04 MED ORDER — HEPARIN SODIUM (PORCINE) 1000 UNIT/ML IJ SOLN
INTRAMUSCULAR | Status: AC
  Filled 2018-09-04: qty 1

## 2018-09-04 MED ORDER — ASCORBIC ACID 500 MG/ML IJ SOLN
500.0000 mg | Freq: Two times a day (BID) | INTRAMUSCULAR | Status: DC
  Filled 2018-09-04 (×2): qty 1

## 2018-09-04 MED ORDER — DEXTROSE 5 % IV SOLN
INTRAVENOUS | Status: AC
  Administered 2018-09-04: 13:00:00 via INTRAVENOUS

## 2018-09-04 MED ORDER — MIDAZOLAM HCL 5 MG/5ML IJ SOLN
INTRAMUSCULAR | Status: AC
  Filled 2018-09-04: qty 5

## 2018-09-04 SURGICAL SUPPLY — 16 items
CANISTER PENUMBRA ENGINE (MISCELLANEOUS) ×3 IMPLANT
CATH BEACON 5 .035 65 KMP TIP (CATHETERS) ×3 IMPLANT
CATH INDIGO CAT6 KIT (CATHETERS) ×3 IMPLANT
CATH PIG 70CM (CATHETERS) ×3 IMPLANT
DEVICE PRESTO INFLATION (MISCELLANEOUS) ×3 IMPLANT
DEVICE STARCLOSE SE CLOSURE (Vascular Products) ×3 IMPLANT
GLIDEWIRE ADV .035X260CM (WIRE) ×3 IMPLANT
PACK ANGIOGRAPHY (CUSTOM PROCEDURE TRAY) ×3 IMPLANT
SHEATH BRITE TIP 5FRX11 (SHEATH) ×3 IMPLANT
SHEATH PINNACLE ST 6F 45CM (SHEATH) ×3 IMPLANT
STENT VIABAHN 5X250X120 (Permanent Stent) ×3 IMPLANT
STENT VIABAHN 6X250X120 (Permanent Stent) ×3 IMPLANT
STENT VIABAHN 6X50X120 (Permanent Stent) ×3 IMPLANT
TUBING CONTRAST HIGH PRESS 72 (TUBING) ×3 IMPLANT
WIRE G V18X300CM (WIRE) ×6 IMPLANT
WIRE J 3MM .035X145CM (WIRE) ×3 IMPLANT

## 2018-09-04 NOTE — Progress Notes (Signed)
Initial Nutrition Assessment  DOCUMENTATION CODES:   Obesity unspecified  INTERVENTION:   Nepro Shake po BID, each supplement provides 425 kcal and 19 grams protein  Rena-vite daily  Ocuvite daily for wound healing (provides zinc, vitamin A, vitamin C, Vitamin E, copper, and selenium)  Vitamin C 500mg  IV BID x 5 days followed by 250mg  PO BID   NUTRITION DIAGNOSIS:   Increased nutrient needs related to chronic illness(ESRD on HD, wound healing ) as evidenced by increased estimated needs.  GOAL:   Patient will meet greater than or equal to 90% of their needs  MONITOR:   PO intake, Supplement acceptance, Labs, Weight trends, Skin, I & O's  REASON FOR ASSESSMENT:   Malnutrition Screening Tool    ASSESSMENT:   57 year old gentleman well-known to our service who has a long-standing history of atherosclerotic occlusive disease particularly of the right lower extremity that has been associated with multiple open wounds which are nonhealing.    Pt with L AVF   Unable to see patient today as pt in angiogram at time of RD visit. Pt with h/o chronic HD for > 5 years. Per chart, pt appears weight stable pta. RD highly suspects pt with scurvy r/t chronic HD and non healing wounds. RD will provide high dose IV vitamin C supplementation, plus vitamins and supplements to encourage wound healing. RD will obtain nutrition related history and exam at follow up.   Medications reviewed and include: aspirin, cinacalcet, ferrous sulfate, insulin, rena-vite, zofran, protonix, renvela, cefazolin, heparin, colace, pepcid, hydromorphone   Labs reviewed: K 5.3(H), Cl 96(L), BUN 35(H), creat 6.26(H) Wbc 12.0(H) cbgs- 144, 131, 144 x 24 hrs AIC 6.9(H)- 1/29  Diet Order:   Diet Order            Diet NPO time specified Except for: Ice Chips, Sips with Meds  Diet effective midnight             EDUCATION NEEDS:   Not appropriate for education at this time  Skin:  Skin Assessment: Reviewed  RN Assessment(Ecchymosis, multiple non-healing wounds RLE)  Last BM:  PTA  Height:   Ht Readings from Last 1 Encounters:  09/03/18 5\' 10"  (1.778 m)    Weight:   Wt Readings from Last 1 Encounters:  09/03/18 113 kg    Ideal Body Weight:  75.5 kg  BMI:  Body mass index is 35.74 kg/m.  Estimated Nutritional Needs:   Kcal:  2200-2500kcal/day   Protein:  125-135g/day   Fluid:  1524ml/day  Koleen Distance MS, RD, LDN Pager #- [phone] Office#- [phone] After Hours Pager: [phone]

## 2018-09-04 NOTE — Op Note (Signed)
[name]  Percutaneous Study/Intervention Procedural Note   Date of Surgery: 09/04/2018  Surgeon(s):Asiah Browder    Assistants:none  Pre-operative Diagnosis: PAD with rest [name] and mottling of the right lower leg  Post-operative diagnosis:  Same  Procedure(s) Performed:             1.  Ultrasound guidance for vascular access left femoral artery             2.  Catheter placement into right SFA from left femoral approach             3.  Aortogram and selective right lower extremity angiogram             4.   Catheter directed thrombolytic therapy with 8 mg of TPA delivered to the right SFA and popliteal arteries with the penumbra cat 6 device             5.   Mechanical thrombectomy of the right SFA, popliteal artery, tibioperoneal trunk, and peroneal artery as well as the profunda femoris artery with the penumbra cat 6 device  6.  Covered stent placement of the right peroneal artery, tibioperoneal trunk, and popliteal artery with 5 mm diameter by 25 cm length covered stent for residual occlusive thrombus after thrombectomy  7.  Covered stent placement to the right SFA with a 6 mm diameter by 25 cm length stent and a 6 mm diameter by 5 cm length stent for residual occlusive thrombus after above procedures             8.  StarClose closure device left femoral artery  EBL: 50 cc  Contrast: 70 cc  Fluoro Time: 13.9 minutes  Moderate Conscious Sedation Time: approximately 60 minutes using 6 mg of Versed and 150 Mcg of Fentanyl              Indications:  Patient is a 57 y.o.male with gangrenous changes and mottling of the right foot and lower leg with rest [name] and a long history of multiple previous interventions.  It was recommended to the patient that he undergo an amputation.  He and his wife would not consent to an amputation unless another angiogram performed.  It was discussed with him the likelihood of success and revascularization was extremely small but  they were adamant we proceed with angiogram.  The patient is brought in for angiography for further evaluation and potential treatment.  Due to the limb threatening nature of the situation, angiogram was performed for attempted limb salvage. The patient is aware that if the procedure fails, amputation would be expected.  The patient also understands that even with successful revascularization, amputation may still be required due to the severity of the situation. Risks and benefits are discussed and informed consent is obtained.   Procedure:  The patient was identified and appropriate procedural time out was performed.  The patient was then placed supine on the table and prepped and draped in the usual sterile fashion. Moderate conscious sedation was administered during a face to face encounter with the patient throughout the procedure with my supervision of the RN administering medicines and monitoring the patient's vital signs, pulse oximetry, telemetry and mental status throughout from the start of the procedure until the patient was taken to the recovery room. Ultrasound was used to evaluate the left common femoral artery.  It was patent .  A digital ultrasound image was acquired.  A Seldinger needle was used to access the left common femoral  artery under direct ultrasound guidance and a permanent image was performed.  A 0.035 J wire was advanced without resistance and a 5Fr sheath was placed.  Pigtail catheter was placed into the aorta and an AP aortogram was performed. This demonstrated sluggish flow in the renal arteries and normal aorta and iliac segments without significant stenosis. I then crossed the aortic bifurcation and advanced to the right femoral head and then into the proximal right superficial femoral artery to help opacify distally. Selective right lower extremity angiogram was then performed. This demonstrated complete occlusion of the right SFA from just beyond its origin with no appreciable  distal runoff on initial imaging.  The profunda femoris artery was also blunted with thrombus beyond the primary branches.  It was felt that an attempt at revascularization will be performed concomitantly today and the patient's best interest.  The patient was systemically heparinized and a 6 [place] destination sheath was placed over the advantage wire.  8 mg of TPA was then deployed with the penumbra cat 6 device in the right SFA and popliteal arteries.  Imaging was then performed with the penumbra catheter in the popliteal artery to try to visualize any distal flow.  With these images, there was thrombus within the tibioperoneal trunk and peroneal artery which was the only runoff distally and had minimal flow even with the catheter just proximal to the vessel.  I then made multiple passes with the penumbra cat 6 device from the proximal SFA throughout the entirety of the SFA and popliteal arteries and down into the tibioperoneal trunk and peroneal artery.  Thrombus was removed but there remained thrombosis with minimal flow distally despite multiple passes.  As a last ditch effort, covered stents were then deployed from the mid peroneal artery up to the origin of the SFA.  Distally, a 5 mm diameter by 25 cm length stent was deployed.  In the SFA a 6 mm diameter by 25 cm length stent and then a 6 mm diameter by 5 cm length stent was deployed.  Despite this, there remained essentially no flow distally.  I then used the penumbra cat 6 device and advanced into the profunda femoris artery to try to aspirate out as much clot from the profunda femoris artery is possible to allow healing of an above-knee amputation.  Multiple passes were made with the penumbra cat 6 catheter in the right profunda femoris artery.  There is still remained thrombus in the distal profunda artery but all the primary branches now appeared to have flow and should allow for healing of above-knee amputation.  At this point, I did not feel there  was much more we could do for revascularization.  I elected to terminate the procedure. The sheath was removed and StarClose closure device was deployed in the left femoral artery with excellent hemostatic result. The patient was taken to the recovery room in stable condition having tolerated the procedure well.  Findings:               Aortogram:  Sluggish flow to the renal arteries but no obvious stenosis.  Aorta and iliac arteries were reasonably normal with no significant stenosis.             Right lower Extremity:  Complete occlusion of the right SFA from just beyond its origin with no appreciable distal runoff on initial imaging.  The profunda femoris artery was also blunted with thrombus beyond the primary branches.  On later images with catheter in the popliteal  artery, there was thrombus within the tibioperoneal trunk and peroneal artery which was the only runoff distally and had minimal flow even with the catheter just proximal to the vessel.   Disposition: Patient was taken to the recovery room in stable condition having tolerated the procedure well.  Complications: None  [name] 09/04/2018 4:40 PM   This note was created with Dragon Medical transcription system. Any errors in dictation are purely unintentional.

## 2018-09-04 NOTE — Consult Note (Signed)
ANTICOAGULATION CONSULT NOTE - Initial Consult  Pharmacy Consult for heparin infusion Indication: ischemic limb  No Known Allergies  Patient Measurements: Height: 5\' 10"  (177.8 cm) Weight: 249 lb 1.9 oz (113 kg) IBW/kg (Calculated) : 73 Heparin Dosing Weight: 97.8  Vital Signs: Temp: 99 F (37.2 C) (11/21 0436) Temp Source: Oral (11/21 0436) BP: 109/58 (11/21 0436) Pulse Rate: 90 (11/21 0436)  Labs: Recent Labs    09/03/18 1600 09/03/18 1607 09/04/18 0146 09/04/18 0920  HGB 13.2  --  12.1* 11.7*  HCT 43.0  --  40.1 38.5*  PLT 309  --  277 272  APTT  --  34  --   --   LABPROT  --  14.7  --   --   INR  --  1.16  --   --   HEPARINUNFRC  --   --  0.25* 0.24*  CREATININE 5.27*  --  6.26*  --     Estimated Creatinine Clearance: 16.2 mL/min (A) (by C-G formula based on SCr of 6.26 mg/dL (H)).   Medical History: Past Medical History:  Diagnosis Date  . Anemia   . Atherosclerosis   . Chronic kidney disease   . Diabetes mellitus without complication ([place])   . Hypercholesteremia   . Hypertension   . Hypotension   . Stroke [facility])     Medications:  Medications Prior to Admission  Medication Sig Dispense Refill Last Dose  . aspirin EC 81 MG tablet Take 81 mg by mouth daily.   09/03/2018 at am  . b complex-vitamin c-folic acid (NEPHRO-VITE) 0.8 MG TABS tablet Take 1 tablet by mouth daily.   09/03/2018 at am  . cinacalcet (SENSIPAR) 60 MG tablet Take 60 mg by mouth daily.    09/03/2018 at am  . clopidogrel (PLAVIX) 75 MG tablet Take 1 tablet (75 mg total) by mouth daily. 30 tablet 11 09/03/2018 at 0800  . ferrous sulfate 325 (65 FE) MG EC tablet Take 325 mg by mouth 3 (three) times daily with meals.   09/03/2018 at am  . glipiZIDE (GLUCOTROL) 10 MG tablet Take 10 mg by mouth daily.    09/03/2018 at am  . lidocaine-prilocaine (EMLA) cream Apply 1 application topically as needed (port access).   09/02/2018 at Unknown time  . omeprazole (PRILOSEC) 40 MG capsule Take 40 mg  by mouth 2 (two) times daily.    09/03/2018 at am  . polyethylene glycol (MIRALAX / GLYCOLAX) packet Take 17 g by mouth 2 (two) times daily.    09/03/2018 at am  . sevelamer carbonate (RENVELA) 800 MG tablet Take 2,400 mg by mouth 3 (three) times daily with meals.    09/03/2018 at am  . traMADol (ULTRAM) 50 MG tablet Take 1 tablet (50 mg total) by mouth every 6 (six) hours as needed. (Patient taking differently: Take 50 mg by mouth every 6 (six) hours as needed for moderate pain or severe pain. ) 20 tablet 0 unknown at unknown    Assessment: [name] is a 57 y.o. male with a history of chronic kidney disease, diabetes as well as peripheral arterial disease was presented emergency department the cold and blue right lower extremity.  The patient, several weeks ago, had a balloon angioplasty and thrombectomy and says that his symptoms are improving.  His wife states that he was "growing new skin."  Warmth also return to the leg.  However, over the past 3 days the leg has started to become blue and cold again.  The patient is not complaining any pain at this time.  Came to the emergency department for further evaluation and treatment. Per chart review no history of PTA anticoagulation. Hgb trending down (13.2 > 12.1>11.7), will continue to follow  Heparin Course 11/20 Initiation 4900 bolus, then 1600 units/hr 11/21 0146 HL 0.25: inc rate to 1800 units/hr 11/21 0920 HL 0.24:   Goal of Therapy:  Heparin level 0.3-0.7 units/ml Monitor platelets by anticoagulation protocol: Yes   Plan:  Increase rate to 2000 units/hr Check heparin level every 8 hours until 2 consecutive therapeutic levels then daily and CBC daily while on heparin Continue to monitor H&H and platelets  Dallie Piles, PharmD Clinical Pharmacist 09/04/2018,10:05 AM

## 2018-09-04 NOTE — H&P (Signed)
[facility] History & Physical Update  The patient was interviewed and re-examined.  The patient's previous History and Physical has been reviewed and is unchanged.  There is no change in the plan of care. We plan to proceed with the scheduled procedure.  Leotis Pain, MD  09/04/2018, 2:25 PM

## 2018-09-04 NOTE — Care Management (Signed)
Amanda Morris dialysis liaison notified of admission.    

## 2018-09-04 NOTE — Consult Note (Signed)
ANTICOAGULATION CONSULT NOTE - Initial Consult  Pharmacy Consult for heparin infusion Indication: ischemic limb  No Known Allergies  Patient Measurements: Height: 5\' 10"  (177.8 cm) Weight: 249 lb 1.9 oz (113 kg) IBW/kg (Calculated) : 73 Heparin Dosing Weight: 97.8  Vital Signs: Temp: 97.6 F (36.4 C) (11/21 1733) Temp Source: Oral (11/21 1733) BP: 136/84 (11/21 1733) Pulse Rate: 108 (11/21 1733)  Labs: Recent Labs    09/03/18 1600 09/03/18 1607 09/04/18 0146 09/04/18 0920  HGB 13.2  --  12.1* 11.7*  HCT 43.0  --  40.1 38.5*  PLT 309  --  277 272  APTT  --  34  --   --   LABPROT  --  14.7  --   --   INR  --  1.16  --   --   HEPARINUNFRC  --   --  0.25* 0.24*  CREATININE 5.27*  --  6.26*  --     Estimated Creatinine Clearance: 16.2 mL/min (A) (by C-G formula based on SCr of 6.26 mg/dL (H)).   Medical History: Past Medical History:  Diagnosis Date  . Anemia   . Atherosclerosis   . Chronic kidney disease   . Diabetes mellitus without complication ([place])   . Hypercholesteremia   . Hypertension   . Hypotension   . Stroke [facility])     Medications:  Medications Prior to Admission  Medication Sig Dispense Refill Last Dose  . aspirin EC 81 MG tablet Take 81 mg by mouth daily.   09/03/2018 at am  . b complex-vitamin c-folic acid (NEPHRO-VITE) 0.8 MG TABS tablet Take 1 tablet by mouth daily.   09/03/2018 at am  . cinacalcet (SENSIPAR) 60 MG tablet Take 60 mg by mouth daily.    09/03/2018 at am  . clopidogrel (PLAVIX) 75 MG tablet Take 1 tablet (75 mg total) by mouth daily. 30 tablet 11 09/03/2018 at 0800  . ferrous sulfate 325 (65 FE) MG EC tablet Take 325 mg by mouth 3 (three) times daily with meals.   09/03/2018 at am  . glipiZIDE (GLUCOTROL) 10 MG tablet Take 10 mg by mouth daily.    09/03/2018 at am  . lidocaine-prilocaine (EMLA) cream Apply 1 application topically as needed (port access).   09/02/2018 at Unknown time  . omeprazole (PRILOSEC) 40 MG capsule Take 40  mg by mouth 2 (two) times daily.    09/03/2018 at am  . polyethylene glycol (MIRALAX / GLYCOLAX) packet Take 17 g by mouth 2 (two) times daily.    09/03/2018 at am  . sevelamer carbonate (RENVELA) 800 MG tablet Take 2,400 mg by mouth 3 (three) times daily with meals.    09/03/2018 at am  . traMADol (ULTRAM) 50 MG tablet Take 1 tablet (50 mg total) by mouth every 6 (six) hours as needed. (Patient taking differently: Take 50 mg by mouth every 6 (six) hours as needed for moderate pain or severe pain. ) 20 tablet 0 unknown at unknown    Assessment: [name] is a 57 y.o. male with a history of chronic kidney disease, diabetes as well as peripheral arterial disease was presented emergency department the cold and blue right lower extremity.  The patient, several weeks ago, had a balloon angioplasty and thrombectomy and says that his symptoms are improving.  His wife states that he was "growing new skin."  Warmth also return to the leg.  However, over the past 3 days the leg has started to become blue and cold again.  The patient is not complaining any pain at this time.  Came to the emergency department for further evaluation and treatment. Per chart review no history of PTA anticoagulation. Hgb trending down (13.2 > 12.1>11.7), will continue to follow  Heparin Course 11/20 Initiation 4900 bolus, then 1600 units/hr 11/21 0146 HL 0.25: inc rate to 1800 units/hr 11/21 0920 HL 0.24:  11/21 drip held due to angio gram 11/21 1854 Drip restarted at previous rate of 2000 units/hr  Goal of Therapy:  Heparin level 0.3-0.7 units/ml Monitor platelets by anticoagulation protocol: Yes   Plan:  Heparin drip was held for angiogram procedure. Drip was restarted at rate of 2000 units/hr @1854  Will check heparin level every 8 hours until 2 consecutive therapeutic levels then daily and CBC daily while on heparin Continue to monitor H&H and platelets  Forrest Moron, PharmD Clinical Pharmacist 09/04/2018,7:14  PM

## 2018-09-04 NOTE — Consult Note (Signed)
ANTICOAGULATION CONSULT NOTE - Initial Consult  Pharmacy Consult for heparin infusion Indication: ischemic limb  No Known Allergies  Patient Measurements: Height: 5\' 10"  (177.8 cm) Weight: 249 lb 1.9 oz (113 kg) IBW/kg (Calculated) : 73 Heparin Dosing Weight: 97.8  Vital Signs: Temp: 98.9 F (37.2 C) (11/20 2112) Temp Source: Oral (11/20 2112) BP: 136/79 (11/20 2112) Pulse Rate: 93 (11/20 2112)  Labs: Recent Labs    09/03/18 1600 09/03/18 1607 09/04/18 0146  HGB 13.2  --  12.1*  HCT 43.0  --  40.1  PLT 309  --  277  APTT  --  34  --   LABPROT  --  14.7  --   INR  --  1.16  --   HEPARINUNFRC  --   --  0.25*  CREATININE 5.27*  --  6.26*    Estimated Creatinine Clearance: 16.2 mL/min (A) (by C-G formula based on SCr of 6.26 mg/dL (H)).   Medical History: Past Medical History:  Diagnosis Date  . Anemia   . Atherosclerosis   . Chronic kidney disease   . Diabetes mellitus without complication ([place])   . Hypercholesteremia   . Hypertension   . Hypotension   . Stroke [facility])     Medications:  Medications Prior to Admission  Medication Sig Dispense Refill Last Dose  . aspirin EC 81 MG tablet Take 81 mg by mouth daily.   09/03/2018 at am  . b complex-vitamin c-folic acid (NEPHRO-VITE) 0.8 MG TABS tablet Take 1 tablet by mouth daily.   09/03/2018 at am  . cinacalcet (SENSIPAR) 60 MG tablet Take 60 mg by mouth daily.    09/03/2018 at am  . clopidogrel (PLAVIX) 75 MG tablet Take 1 tablet (75 mg total) by mouth daily. 30 tablet 11 09/03/2018 at 0800  . ferrous sulfate 325 (65 FE) MG EC tablet Take 325 mg by mouth 3 (three) times daily with meals.   09/03/2018 at am  . glipiZIDE (GLUCOTROL) 10 MG tablet Take 10 mg by mouth daily.    09/03/2018 at am  . lidocaine-prilocaine (EMLA) cream Apply 1 application topically as needed (port access).   09/02/2018 at Unknown time  . omeprazole (PRILOSEC) 40 MG capsule Take 40 mg by mouth 2 (two) times daily.    09/03/2018 at am  .  polyethylene glycol (MIRALAX / GLYCOLAX) packet Take 17 g by mouth 2 (two) times daily.    09/03/2018 at am  . sevelamer carbonate (RENVELA) 800 MG tablet Take 2,400 mg by mouth 3 (three) times daily with meals.    09/03/2018 at am  . traMADol (ULTRAM) 50 MG tablet Take 1 tablet (50 mg total) by mouth every 6 (six) hours as needed. (Patient taking differently: Take 50 mg by mouth every 6 (six) hours as needed for moderate pain or severe pain. ) 20 tablet 0 unknown at unknown    Assessment: [name] is a 57 y.o. male with a history of chronic kidney disease, diabetes as well as peripheral arterial disease was presented emergency department the cold and blue right lower extremity.  The patient, several weeks ago, had a balloon angioplasty and thrombectomy and says that his symptoms are improving.  His wife states that he was "growing new skin."  Warmth also return to the leg.  However, over the past 3 days the leg has started to become blue and cold again.  The patient is not complaining any pain at this time.  Came to the emergency department for further  evaluation and treatment. Per chart review no history of PTA anticoagulation.  Goal of Therapy:  Heparin level 0.3-0.7 units/ml Monitor platelets by anticoagulation protocol: Yes   Plan:  11/21 @ 0200 HL 0.25 subtherapeutic. Will increase rate to 1800 units/hr and will recheck HL @ 0900. hgb down by 1 unit (13.2 >> 12.1) will continue to monitor.  Tobie Lords, PharmD Clinical Pharmacist 09/04/2018,3:18 AM

## 2018-09-04 NOTE — Progress Notes (Signed)
[facility] [place]: [name]    MR#:  [id number]  DATE OF BIRTH:  07-01-1961  SUBJECTIVE:  CHIEF COMPLAINT:  No chief complaint on file.   Came with Right foot pain, cold, gangrenous.  on heparin drip, plan for Angioplasty today.  REVIEW OF SYSTEMS:  CONSTITUTIONAL: No fever, fatigue or weakness.  EYES: No blurred or double vision.  EARS, NOSE, AND THROAT: No tinnitus or ear pain.  RESPIRATORY: No cough, shortness of breath, wheezing or hemoptysis.  CARDIOVASCULAR: No chest pain, orthopnea, edema.  GASTROINTESTINAL: No nausea, vomiting, diarrhea or abdominal pain.  GENITOURINARY: No dysuria, hematuria.  ENDOCRINE: No polyuria, nocturia,  HEMATOLOGY: No anemia, easy bruising or bleeding SKIN: No rash or lesion. MUSCULOSKELETAL: No joint pain or arthritis.   NEUROLOGIC: No tingling, numbness, weakness.  PSYCHIATRY: No anxiety or depression.   ROS  DRUG ALLERGIES:  No Known Allergies  VITALS:  Blood pressure (!) 141/79, pulse 92, temperature 97.9 F (36.6 C), temperature source Oral, resp. rate 13, height 5\' 10"  (1.778 m), weight 113 kg, SpO2 99 %.  PHYSICAL EXAMINATION:   GENERAL:  57 y.o.-year-old patient lying in the bed with no acute distress.  EYES: Pupils equal, round, reactive to light and accommodation. No scleral icterus. Extraocular muscles intact.  HEENT: Head atraumatic, normocephalic. Oropharynx and nasopharynx clear.  NECK:  Supple, no jugular venous distention. No thyroid enlargement, no tenderness.  LUNGS: Normal breath sounds bilaterally, no wheezing, rales,rhonchi or crepitation. No use of accessory muscles of respiration.  CARDIOVASCULAR: S1, S2 normal. No murmurs, rubs, or gallops.  ABDOMEN: Soft, nontender, nondistended. Bowel sounds present. No organomegaly or mass.  EXTREMITIES: No pedal edema, cold right leg and foot.  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 NEUROLOGIC: Cranial nerves II through XII are intact. Muscle strength 4/5 in all extremities. Sensation intact. Gait not checked.  PSYCHIATRIC: The patient is alert and oriented x 3.  SKIN: No obvious rash, lesion, or ulcer.   Physical Exam LABORATORY PANEL:   CBC Recent Labs  Lab 09/04/18 0920  WBC 12.0*  HGB 11.7*  HCT 38.5*  PLT 272   ------------------------------------------------------------------------------------------------------------------  Chemistries  Recent Labs  Lab 09/04/18 0146  NA 137  K 5.3*  CL 96*  CO2 29  GLUCOSE 168*  BUN 35*  CREATININE 6.26*  CALCIUM 9.1   ------------------------------------------------------------------------------------------------------------------  Cardiac Enzymes No results for input(s): TROPONINI in the last 168 hours. ------------------------------------------------------------------------------------------------------------------  RADIOLOGY:  No results found.  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  Currently IV heparin drip. Vascular surgery - plan for angiogram today. Patient and family had refused for amputation in the past. Continue aspirin, hold Plavix for now.  *Diabetes mellitus with complications of atherosclerosis and peripheral arteries I will hold oral meds and keep on sliding scale coverage.  *End-stage renal disease on hemodialysis Nephrologist to help with continuing dialysis in the hospital. Continue supplemental medications as per nephrologist.  *Hyperkalemia Patient is a dialysis patient and I would like to nephrologist handle this.   All the records are reviewed and case discussed with Care Management/Social Workerr. Management plans  discussed with the patient, family and they are in agreement.  CODE STATUS: Full.  TOTAL TIME TAKING CARE OF THIS PATIENT: 46  minutes.     POSSIBLE D/C IN 1-2 DAYS, DEPENDING ON CLINICAL CONDITION.   Vaughan Basta M.D on 09/04/2018   Between 7am to 6pm - Pager - [phone]  After 6pm go to [email or web address] - password [email or web address] [facility]  Office  [phone]  CC: Primary care physician; [name], MD  Note: This dictation was prepared with Dragon dictation along with smaller phrase technology. Any transcriptional errors that result from this process are unintentional.

## 2018-09-04 NOTE — Progress Notes (Signed)
[facility]  ROUNDING NOTE   Subjective:   Mr. LAMONE FERRELLI admitted to [facility] on 09/03/2018 for  Ischemic leg [I99.8]  Last hemodialysis yesterday. As outpatient  History taken with assistance of [employer] interpreter.   Patient's wife is concerned about weakness, and pain. She is wanting patient to get something to eat.   Scheduled for angiogram later today.   Objective:  Vital signs in last 24 hours:  Temp:  [98.5 F (36.9 C)-99 F (37.2 C)] 99 F (37.2 C) (11/21 0436) Pulse Rate:  [84-95] 90 (11/21 0436) Resp:  [18-20] 18 (11/21 0436) BP: (108-146)/(58-81) 109/58 (11/21 0436) SpO2:  [90 %-100 %] 90 % (11/21 0436) Weight:  [656 kg] 113 kg (11/20 1538)  Weight change:  Filed Weights   09/03/18 1538  Weight: 113 kg    Intake/Output: No intake/output data recorded.   Intake/Output this shift:  Total I/O In: 263.1 [I.V.:263.1] Out: -   Physical Exam: General: NAD,   Head: Normocephalic, atraumatic. Moist oral mucosal membranes  Eyes: Anicteric, PERRL  Neck: Supple, trachea midline  Lungs:  Clear to auscultation  Heart: Regular rate and rhythm  Abdomen:  Soft, nontender,   Extremities: Right lower extremity with pain and cold to touch, left BKA  Neurologic: Nonfocal, moving all four extremities  Skin: No lesions  Access: AVF    Basic Metabolic Panel: Recent Labs  Lab 09/03/18 1600 09/04/18 0146  NA 136 137  K 5.7* 5.3*  CL 93* 96*  CO2 28 29  GLUCOSE 228* 168*  BUN 25* 35*  CREATININE 5.27* 6.26*  CALCIUM 9.1 9.1    Liver Function Tests: No results for input(s): AST, ALT, ALKPHOS, BILITOT, PROT, ALBUMIN in the last 168 hours. No results for input(s): LIPASE, AMYLASE in the last 168 hours. No results for input(s): AMMONIA in the last 168 hours.  CBC: Recent Labs  Lab 09/03/18 1600 09/04/18 0146 09/04/18 0920  WBC 13.4* 11.2* 12.0*  NEUTROABS 10.5*  --   --   HGB 13.2 12.1* 11.7*  HCT 43.0 40.1 38.5*  MCV 97.3 97.8 97.2  PLT  309 277 272    Cardiac Enzymes: No results for input(s): CKTOTAL, CKMB, CKMBINDEX, TROPONINI in the last 168 hours.  BNP: Invalid input(s): POCBNP  CBG: Recent Labs  Lab 09/03/18 2149 09/04/18 0745 09/04/18 1134  GLUCAP 209* 144* 131*    Microbiology: Results for orders placed or performed during the hospital encounter of 08/12/18  MRSA PCR Screening     Status: None   Collection Time: 08/13/18  9:08 AM  Result Value Ref Range Status   MRSA by PCR NEGATIVE NEGATIVE Final    Comment:        The GeneXpert MRSA Assay (FDA approved for NASAL specimens only), is one component of a comprehensive MRSA colonization surveillance program. It is not intended to diagnose MRSA infection nor to guide or monitor treatment for MRSA infections. Performed at [facility], [address] [place], [place] [address]     Coagulation Studies: Recent Labs    09/03/18 1607  LABPROT 14.7  INR 1.16    Urinalysis: No results for input(s): COLORURINE, LABSPEC, PHURINE, GLUCOSEU, HGBUR, BILIRUBINUR, KETONESUR, PROTEINUR, UROBILINOGEN, NITRITE, LEUKOCYTESUR in the last 72 hours.  Invalid input(s): APPERANCEUR    Imaging: No results found.   Medications:   .  ceFAZolin (ANCEF) IV    . heparin 2,000 Units/hr (09/04/18 1054)   . aspirin EC  81 mg Oral Daily  . cinacalcet  60 mg Oral Q breakfast  . ferrous sulfate  325 mg Oral TID WC  . insulin aspart  0-9 Units Subcutaneous TID WC  . multivitamin  1 tablet Oral QHS  . pantoprazole  40 mg Oral BID  . polyethylene glycol  17 g Oral BID  . sevelamer carbonate  2,400 mg Oral TID WC   docusate sodium, HYDROmorphone (DILAUDID) injection, lidocaine-prilocaine, traMADol  Assessment/ Plan:  Mr. DEVON PRETTY is a 57 y.o. Hispanic male with end stage renal disease, peripheral vascular disease, hypertension, CVA, hyperlipidemia, diabetes mellitus type II who is admitted to [facility]   MWF [place] 113kg Left  AVF  1. End Stage Renal Disease: Hemodialysis for tomorrow. MWF schedule  2. Anemia of chronic kidney disease:  - EPO as outpatient. Would hold due to ischemia  3. Hypertension:  - Patient takes midodrine before his dialysis treatments - home regimen of amlodipine  4. Secondary Hyperparathyroidism:  - sevelamer and calcium acetate with meals.  - cinacalcet on hemodialysis days.   5. Peripheral arterial disease: angiogram for today.  - Appreciate vascular input.  - Heparin gtt.   6. Diabetes mellitus type II with chronic kidney disease: - dextrose infusion to prevent hypoglycemia.    LOS: 1 Javeon Macmurray 11/21/201912:16 PM

## 2018-09-05 ENCOUNTER — Encounter: Payer: Self-pay | Admitting: Vascular Surgery

## 2018-09-05 LAB — GLUCOSE, CAPILLARY
GLUCOSE-CAPILLARY: 177 mg/dL — AB (ref 70–99)
GLUCOSE-CAPILLARY: 166 mg/dL — AB (ref 70–99)
Glucose-Capillary: 165 mg/dL — ABNORMAL HIGH (ref 70–99)
Glucose-Capillary: 185 mg/dL — ABNORMAL HIGH (ref 70–99)

## 2018-09-05 LAB — RENAL FUNCTION PANEL
ALBUMIN: 3.5 g/dL (ref 3.5–5.0)
Anion gap: 11 (ref 5–15)
BUN: 45 mg/dL — AB (ref 6–20)
CHLORIDE: 96 mmol/L — AB (ref 98–111)
CO2: 28 mmol/L (ref 22–32)
CREATININE: 7.45 mg/dL — AB (ref 0.61–1.24)
Calcium: 8.7 mg/dL — ABNORMAL LOW (ref 8.9–10.3)
GFR calc Af Amer: 8 mL/min — ABNORMAL LOW (ref >60)
GFR, EST NON AFRICAN AMERICAN: 7 mL/min — AB (ref >60)
GLUCOSE: 183 mg/dL — AB (ref 70–99)
POTASSIUM: 5.8 mmol/L — AB (ref 3.5–5.1)
Phosphorus: 5.6 mg/dL — ABNORMAL HIGH (ref 2.5–4.6)
Sodium: 135 mmol/L (ref 135–145)

## 2018-09-05 LAB — CBC
HEMATOCRIT: 36.6 % — AB (ref 39.0–52.0)
HEMOGLOBIN: 11.1 g/dL — AB (ref 13.0–17.0)
MCH: 29.6 pg (ref 26.0–34.0)
MCHC: 30.3 g/dL (ref 30.0–36.0)
MCV: 97.6 fL (ref 80.0–100.0)
Platelets: 288 10*3/uL (ref 150–400)
RBC: 3.75 MIL/uL — AB (ref 4.22–5.81)
RDW: 16 % — ABNORMAL HIGH (ref 11.5–15.5)
WBC: 13.6 10*3/uL — ABNORMAL HIGH (ref 4.0–10.5)
nRBC: 0 % (ref 0.0–0.2)

## 2018-09-05 LAB — HEPARIN LEVEL (UNFRACTIONATED)
HEPARIN UNFRACTIONATED: 0.23 [IU]/mL — AB (ref 0.30–0.70)
HEPARIN UNFRACTIONATED: 0.23 [IU]/mL — AB (ref 0.30–0.70)
HEPARIN UNFRACTIONATED: 0.45 [IU]/mL (ref 0.30–0.70)

## 2018-09-05 MED ORDER — HYDROMORPHONE HCL 1 MG/ML IJ SOLN
1.0000 mg | INTRAMUSCULAR | Status: DC | PRN
  Administered 2018-09-05 – 2018-09-09 (×27): 1 mg via INTRAVENOUS
  Filled 2018-09-05 (×28): qty 1

## 2018-09-05 MED ORDER — HYDROMORPHONE HCL 1 MG/ML IJ SOLN
INTRAMUSCULAR | Status: AC
  Filled 2018-09-05: qty 1

## 2018-09-05 MED ORDER — MIDODRINE HCL 5 MG PO TABS
5.0000 mg | ORAL_TABLET | Freq: Three times a day (TID) | ORAL | Status: DC
  Administered 2018-09-06 – 2018-09-15 (×23): 5 mg via ORAL
  Filled 2018-09-05 (×30): qty 1

## 2018-09-05 MED ORDER — SENNOSIDES-DOCUSATE SODIUM 8.6-50 MG PO TABS
1.0000 | ORAL_TABLET | Freq: Two times a day (BID) | ORAL | Status: DC
  Administered 2018-09-05 – 2018-09-15 (×18): 1 via ORAL
  Filled 2018-09-05 (×20): qty 1

## 2018-09-05 MED ORDER — HYDROMORPHONE HCL 1 MG/ML IJ SOLN
0.5000 mg | INTRAMUSCULAR | Status: DC | PRN
  Administered 2018-09-05: 0.5 mg via INTRAVENOUS

## 2018-09-05 NOTE — Progress Notes (Signed)
This note also relates to the following rows which could not be included: Pulse Rate - Cannot attach notes to unvalidated device data Resp - Cannot attach notes to unvalidated device data BP - Cannot attach notes to unvalidated device data  Hd started  

## 2018-09-05 NOTE — Plan of Care (Signed)
Dialysis performed. Vascular service has seen the patient. PRN pain meds provided for pain.  Problem: Education: Goal: Knowledge of General Education information will improve Description Including pain rating scale, medication(s)/side effects and non-pharmacologic comfort measures Outcome: Progressing   Problem: Health Behavior/Discharge Planning: Goal: Ability to manage health-related needs will improve Outcome: Progressing   Problem: Clinical Measurements: Goal: Ability to maintain clinical measurements within normal limits will improve Outcome: Progressing Goal: Will remain free from infection Outcome: Progressing Goal: Diagnostic test results will improve Outcome: Progressing Goal: Respiratory complications will improve Outcome: Progressing Goal: Cardiovascular complication will be avoided Outcome: Progressing   Problem: Activity: Goal: Risk for activity intolerance will decrease Outcome: Progressing   Problem: Nutrition: Goal: Adequate nutrition will be maintained Outcome: Progressing   Problem: Coping: Goal: Level of anxiety will decrease Outcome: Progressing   Problem: Elimination: Goal: Will not experience complications related to bowel motility Outcome: Progressing Goal: Will not experience complications related to urinary retention Outcome: Progressing   Problem: Pain Managment: Goal: General experience of comfort will improve Outcome: Progressing   Problem: Safety: Goal: Ability to remain free from injury will improve Outcome: Progressing   Problem: Skin Integrity: Goal: Risk for impaired skin integrity will decrease Outcome: Progressing

## 2018-09-05 NOTE — Progress Notes (Signed)
[facility]  ROUNDING NOTE   Subjective:   Seen and examined on hemodialysis.     HEMODIALYSIS FLOWSHEET:  Blood Flow Rate (mL/min): 350 mL/min Arterial Pressure (mmHg): -170 mmHg Venous Pressure (mmHg): 160 mmHg Transmembrane Pressure (mmHg): 60 mmHg Ultrafiltration Rate (mL/min): 330 mL/min Dialysate Flow Rate (mL/min): 600 ml/min Conductivity: Machine : 13.4 Conductivity: Machine : 13.4 Dialysis Fluid Bolus: Normal Saline Bolus Amount (mL): 250 mL    Objective:  Vital signs in last 24 hours:  Temp:  [97.6 F (36.4 C)-99.7 F (37.6 C)] 99.7 F (37.6 C) (11/22 1230) Pulse Rate:  [92-109] 103 (11/22 1315) Resp:  [12-19] 14 (11/22 1315) BP: (94-161)/(62-94) 94/66 (11/22 1315) SpO2:  [87 %-100 %] 92 % (11/22 0533)  Weight change:  Filed Weights   09/03/18 1538  Weight: 113 kg    Intake/Output: I/O last 3 completed shifts: In: 674.9 [P.O.:120; I.V.:510.9; IV Piggyback:44] Out: 0    Intake/Output this shift:  Total I/O In: 120 [P.O.:120] Out: -   Physical Exam: General: NAD,   Head: Normocephalic, atraumatic. Moist oral mucosal membranes  Eyes: Anicteric, PERRL  Neck: Supple, trachea midline  Lungs:  Clear to auscultation  Heart: Regular rate and rhythm  Abdomen:  Soft, nontender,   Extremities: Right lower extremity with pain and cold to touch, left BKA  Neurologic: Nonfocal, moving all four extremities  Skin: No lesions  Access: AVF    Basic Metabolic Panel: Recent Labs  Lab 09/03/18 1600 09/04/18 0146 09/05/18 1202  NA 136 137 135  K 5.7* 5.3* 5.8*  CL 93* 96* 96*  CO2 28 29 28   GLUCOSE 228* 168* 183*  BUN 25* 35* 45*  CREATININE 5.27* 6.26* 7.45*  CALCIUM 9.1 9.1 8.7*  PHOS  --   --  5.6*    Liver Function Tests: Recent Labs  Lab 09/05/18 1202  ALBUMIN 3.5   No results for input(s): LIPASE, AMYLASE in the last 168 hours. No results for input(s): AMMONIA in the last 168 hours.  CBC: Recent Labs  Lab 09/03/18 1600  09/04/18 0146 09/04/18 0920 09/05/18 0256  WBC 13.4* 11.2* 12.0* 13.6*  NEUTROABS 10.5*  --   --   --   HGB 13.2 12.1* 11.7* 11.1*  HCT 43.0 40.1 38.5* 36.6*  MCV 97.3 97.8 97.2 97.6  PLT 309 277 272 288    Cardiac Enzymes: No results for input(s): CKTOTAL, CKMB, CKMBINDEX, TROPONINI in the last 168 hours.  BNP: Invalid input(s): POCBNP  CBG: Recent Labs  Lab 09/04/18 1415 09/04/18 1646 09/04/18 1736 09/05/18 0759 09/05/18 1158  GLUCAP 144* 162* 167* 165* 177*    Microbiology: Results for orders placed or performed during the hospital encounter of 08/12/18  MRSA PCR Screening     Status: None   Collection Time: 08/13/18  9:08 AM  Result Value Ref Range Status   MRSA by PCR NEGATIVE NEGATIVE Final    Comment:        The GeneXpert MRSA Assay (FDA approved for NASAL specimens only), is one component of a comprehensive MRSA colonization surveillance program. It is not intended to diagnose MRSA infection nor to guide or monitor treatment for MRSA infections. Performed at [facility], [address] [place], [place] [address]     Coagulation Studies: Recent Labs    09/03/18 1607  LABPROT 14.7  INR 1.16    Urinalysis: No results for input(s): COLORURINE, LABSPEC, PHURINE, GLUCOSEU, HGBUR, BILIRUBINUR, KETONESUR, PROTEINUR, UROBILINOGEN, NITRITE, LEUKOCYTESUR in the last 72 hours.  Invalid  input(s): APPERANCEUR    Imaging: No results found.   Medications:   . small volume/piggyback Museum/gallery curator    . heparin 2,350 Units/hr (09/05/18 1201)   . aspirin EC  81 mg Oral Daily  . cinacalcet  60 mg Oral Once per day on Mon Wed Fri  . feeding supplement (NEPRO CARB STEADY)  237 mL Oral BID BM  . ferrous sulfate  325 mg Oral TID WC  . insulin aspart  0-9 Units Subcutaneous TID WC  . multivitamin  1 tablet Oral QHS  . multivitamin-lutein  1 capsule Oral Daily  . pantoprazole  40 mg Oral BID  . polyethylene glycol  17 g Oral BID  . sevelamer  carbonate  2,400 mg Oral TID WC  . [START ON 09/09/2018] vitamin C  250 mg Oral BID   docusate sodium, HYDROmorphone (DILAUDID) injection **OR** HYDROmorphone (DILAUDID) injection, lidocaine-prilocaine, ondansetron (ZOFRAN) IV, traMADol  Assessment/ Plan:  [name] is a 57 y.o. Hispanic male with end stage renal disease, peripheral vascular disease, hypertension, CVA, hyperlipidemia, diabetes mellitus type II who is admitted to [facility]   MWF [place] 113kg Left AVF  1. End Stage Renal Disease:  MWF schedule Tolerating treatment well.   2. Anemia of chronic kidney disease:  - EPO as outpatient. Would hold due to ischemia  3. Hypertension:  - Patient takes midodrine before his dialysis treatments - home regimen of amlodipine  4. Secondary Hyperparathyroidism:  - sevelamer and calcium acetate with meals.  - cinacalcet on hemodialysis days.   5. Peripheral arterial disease: amputation scheduled fro 11/25.  - Appreciate vascular input.  - Heparin gtt.   6. Diabetes mellitus type II with chronic kidney disease: Continue glucose control.    LOS: 2 [name] 11/22/20191:31 PM

## 2018-09-05 NOTE — Consult Note (Signed)
ANTICOAGULATION CONSULT NOTE -  Pharmacy Consult for heparin infusion Indication: ischemic limb  No Known Allergies  Patient Measurements: Height: 5\' 10"  (177.8 cm) Weight: 249 lb 1.9 oz (113 kg) IBW/kg (Calculated) : 73 Heparin Dosing Weight: 97.8  Vital Signs: Temp: 98.4 F (36.9 C) (11/22 0533) Temp Source: Oral (11/22 0533) BP: 128/62 (11/22 0533) Pulse Rate: 98 (11/22 0533)  Labs: Recent Labs    09/03/18 1600 09/03/18 1607  09/04/18 0146 09/04/18 0920 09/05/18 0256 09/05/18 1024  HGB 13.2  --   --  12.1* 11.7* 11.1*  --   HCT 43.0  --   --  40.1 38.5* 36.6*  --   PLT 309  --   --  277 272 288  --   APTT  --  34  --   --   --   --   --   LABPROT  --  14.7  --   --   --   --   --   INR  --  1.16  --   --   --   --   --   HEPARINUNFRC  --   --    < > 0.25* 0.24* 0.23* 0.23*  CREATININE 5.27*  --   --  6.26*  --   --   --    < > = values in this interval not displayed.    Estimated Creatinine Clearance: 16.2 mL/min (A) (by C-G formula based on SCr of 6.26 mg/dL (H)).   Medical History: Past Medical History:  Diagnosis Date  . Anemia   . Atherosclerosis   . Chronic kidney disease   . Diabetes mellitus without complication ([place])   . Hypercholesteremia   . Hypertension   . Hypotension   . Stroke [facility])     Medications:  Medications Prior to Admission  Medication Sig Dispense Refill Last Dose  . aspirin EC 81 MG tablet Take 81 mg by mouth daily.   09/03/2018 at am  . b complex-vitamin c-folic acid (NEPHRO-VITE) 0.8 MG TABS tablet Take 1 tablet by mouth daily.   09/03/2018 at am  . cinacalcet (SENSIPAR) 60 MG tablet Take 60 mg by mouth daily.    09/03/2018 at am  . clopidogrel (PLAVIX) 75 MG tablet Take 1 tablet (75 mg total) by mouth daily. 30 tablet 11 09/03/2018 at 0800  . ferrous sulfate 325 (65 FE) MG EC tablet Take 325 mg by mouth 3 (three) times daily with meals.   09/03/2018 at am  . glipiZIDE (GLUCOTROL) 10 MG tablet Take 10 mg by mouth daily.     09/03/2018 at am  . lidocaine-prilocaine (EMLA) cream Apply 1 application topically as needed (port access).   09/02/2018 at Unknown time  . omeprazole (PRILOSEC) 40 MG capsule Take 40 mg by mouth 2 (two) times daily.    09/03/2018 at am  . polyethylene glycol (MIRALAX / GLYCOLAX) packet Take 17 g by mouth 2 (two) times daily.    09/03/2018 at am  . sevelamer carbonate (RENVELA) 800 MG tablet Take 2,400 mg by mouth 3 (three) times daily with meals.    09/03/2018 at am  . traMADol (ULTRAM) 50 MG tablet Take 1 tablet (50 mg total) by mouth every 6 (six) hours as needed. (Patient taking differently: Take 50 mg by mouth every 6 (six) hours as needed for moderate pain or severe pain. ) 20 tablet 0 unknown at unknown    Assessment: [name] is a 57 y.o. male with a  history of chronic kidney disease, diabetes as well as peripheral arterial disease was presented emergency department the cold and blue right lower extremity.  The patient, several weeks ago, had a balloon angioplasty and thrombectomy and says that his symptoms are improving.  His wife states that he was "growing new skin."  Warmth also return to the leg.  However, over the past 3 days the leg has started to become blue and cold again.  The patient is not complaining any pain at this time.  Came to the emergency department for further evaluation and treatment. Per chart review no history of PTA anticoagulation. Hgb trending down (13.2 > 12.1>11.7), will continue to follow  Heparin Course 11/20 Initiation 4900 bolus, then 1600 units/hr 11/21 0146 HL 0.25: inc rate to 1800 units/hr 11/21 0920 HL 0.24:  11/21 drip held due to angio gram 11/21 1854 Drip restarted at previous rate of 2000 units/hr 11/22 0300 HL 0.23 rate inc 2150 unit/hr 11/22 1024 HL 0.23  Goal of Therapy:  Heparin level 0.3-0.7 units/ml Monitor platelets by anticoagulation protocol: Yes   Plan:  11/22 @ 1024 HL 0.23 subtherapeutic. Will increase rate to 2350 units/hr  and will recheck HL in 6 hours. Will continue to monitor CBC as patient is [place], PharmD Clinical Pharmacist 09/05/2018,11:48 AM

## 2018-09-05 NOTE — Progress Notes (Signed)
This note also relates to the following rows which could not be included: Pulse Rate - Cannot attach notes to unvalidated device data Resp - Cannot attach notes to unvalidated device data BP - Cannot attach notes to unvalidated device data  Hd completed  

## 2018-09-05 NOTE — Progress Notes (Signed)
Palliative care consult received and chart reviewed. Patient was in HD most of afternoon since the consult was received. Due to this, there will be a delay in seeing this patient. PMT will follow up Monday. If patient is discharged over the weekend and there are palliative needs, recommend outpatient palliative referral.  Thank you for this consult.  Juel Burrow, DNP, AGNP-C Palliative Medicine Team Team Phone # [phone]  Pager # [phone]  NO CHARGE

## 2018-09-05 NOTE — Consult Note (Signed)
ANTICOAGULATION CONSULT NOTE - Initial Consult  Pharmacy Consult for heparin infusion Indication: ischemic limb  No Known Allergies  Patient Measurements: Height: 5\' 10"  (177.8 cm) Weight: 249 lb 1.9 oz (113 kg) IBW/kg (Calculated) : 73 Heparin Dosing Weight: 97.8  Vital Signs: Temp: 98.5 F (36.9 C) (11/21 2058) Temp Source: Oral (11/21 3419) BP: 113/87 (11/21 2058) Pulse Rate: 107 (11/21 2058)  Labs: Recent Labs    09/03/18 1600 09/03/18 1607 09/04/18 0146 09/04/18 0920 09/05/18 0256  HGB 13.2  --  12.1* 11.7* 11.1*  HCT 43.0  --  40.1 38.5* 36.6*  PLT 309  --  277 272 288  APTT  --  34  --   --   --   LABPROT  --  14.7  --   --   --   INR  --  1.16  --   --   --   HEPARINUNFRC  --   --  0.25* 0.24* 0.23*  CREATININE 5.27*  --  6.26*  --   --     Estimated Creatinine Clearance: 16.2 mL/min (A) (by C-G formula based on SCr of 6.26 mg/dL (H)).   Medical History: Past Medical History:  Diagnosis Date  . Anemia   . Atherosclerosis   . Chronic kidney disease   . Diabetes mellitus without complication ([place])   . Hypercholesteremia   . Hypertension   . Hypotension   . Stroke [facility])     Medications:  Medications Prior to Admission  Medication Sig Dispense Refill Last Dose  . aspirin EC 81 MG tablet Take 81 mg by mouth daily.   09/03/2018 at am  . b complex-vitamin c-folic acid (NEPHRO-VITE) 0.8 MG TABS tablet Take 1 tablet by mouth daily.   09/03/2018 at am  . cinacalcet (SENSIPAR) 60 MG tablet Take 60 mg by mouth daily.    09/03/2018 at am  . clopidogrel (PLAVIX) 75 MG tablet Take 1 tablet (75 mg total) by mouth daily. 30 tablet 11 09/03/2018 at 0800  . ferrous sulfate 325 (65 FE) MG EC tablet Take 325 mg by mouth 3 (three) times daily with meals.   09/03/2018 at am  . glipiZIDE (GLUCOTROL) 10 MG tablet Take 10 mg by mouth daily.    09/03/2018 at am  . lidocaine-prilocaine (EMLA) cream Apply 1 application topically as needed (port access).   09/02/2018 at  Unknown time  . omeprazole (PRILOSEC) 40 MG capsule Take 40 mg by mouth 2 (two) times daily.    09/03/2018 at am  . polyethylene glycol (MIRALAX / GLYCOLAX) packet Take 17 g by mouth 2 (two) times daily.    09/03/2018 at am  . sevelamer carbonate (RENVELA) 800 MG tablet Take 2,400 mg by mouth 3 (three) times daily with meals.    09/03/2018 at am  . traMADol (ULTRAM) 50 MG tablet Take 1 tablet (50 mg total) by mouth every 6 (six) hours as needed. (Patient taking differently: Take 50 mg by mouth every 6 (six) hours as needed for moderate pain or severe pain. ) 20 tablet 0 unknown at unknown    Assessment: [name] is a 57 y.o. male with a history of chronic kidney disease, diabetes as well as peripheral arterial disease was presented emergency department the cold and blue right lower extremity.  The patient, several weeks ago, had a balloon angioplasty and thrombectomy and says that his symptoms are improving.  His wife states that he was "growing new skin."  Warmth also return to the leg.  However, over the past 3 days the leg has started to become blue and cold again.  The patient is not complaining any pain at this time.  Came to the emergency department for further evaluation and treatment. Per chart review no history of PTA anticoagulation. Hgb trending down (13.2 > 12.1>11.7), will continue to follow  Heparin Course 11/20 Initiation 4900 bolus, then 1600 units/hr 11/21 0146 HL 0.25: inc rate to 1800 units/hr 11/21 0920 HL 0.24:  11/21 drip held due to angio gram 11/21 1854 Drip restarted at previous rate of 2000 units/hr  Goal of Therapy:  Heparin level 0.3-0.7 units/ml Monitor platelets by anticoagulation protocol: Yes   Plan:  11/22 @ 0300 HL 0.23 subtherapeutic. Will increase rate to 2150 units/hr and will recheck HL @ 1000. Will continue to monitor CBC as patient is post-angiogram.  Tobie Lords, PharmD Clinical Pharmacist 09/05/2018,4:04 AM

## 2018-09-05 NOTE — Care Management Important Message (Signed)
Copy of Medicare IM left in patient's room (out for dialysis).

## 2018-09-05 NOTE — Progress Notes (Signed)
ANTICOAGULATION CONSULT NOTE - Initial Consult  Pharmacy Consult for Heparin  Indication: ischemic limb  No Known Allergies  Patient Measurements: Height: 5\' 10"  (177.8 cm) Weight: 249 lb 1.9 oz (113 kg) IBW/kg (Calculated) : 73 Heparin Dosing Weight: 97.8 kg   Vital Signs: Temp: 99.2 F (37.3 C) (11/22 1600) Temp Source: Oral (11/22 1600) BP: 108/67 (11/22 1600) Pulse Rate: 100 (11/22 1606)  Labs: Recent Labs    09/03/18 1600 09/03/18 1607  09/04/18 0146 09/04/18 0920 09/05/18 0256 09/05/18 1024 09/05/18 1202 09/05/18 1946  HGB 13.2  --   --  12.1* 11.7* 11.1*  --   --   --   HCT 43.0  --   --  40.1 38.5* 36.6*  --   --   --   PLT 309  --   --  277 272 288  --   --   --   APTT  --  34  --   --   --   --   --   --   --   LABPROT  --  14.7  --   --   --   --   --   --   --   INR  --  1.16  --   --   --   --   --   --   --   HEPARINUNFRC  --   --    < > 0.25* 0.24* 0.23* 0.23*  --  0.45  CREATININE 5.27*  --   --  6.26*  --   --   --  7.45*  --    < > = values in this interval not displayed.    Estimated Creatinine Clearance: 13.6 mL/min (A) (by C-G formula based on SCr of 7.45 mg/dL (H)).   Medical History: Past Medical History:  Diagnosis Date  . Anemia   . Atherosclerosis   . Chronic kidney disease   . Diabetes mellitus without complication ([place])   . Hypercholesteremia   . Hypertension   . Hypotension   . Stroke [facility])     Medications:  Medications Prior to Admission  Medication Sig Dispense Refill Last Dose  . aspirin EC 81 MG tablet Take 81 mg by mouth daily.   09/03/2018 at am  . b complex-vitamin c-folic acid (NEPHRO-VITE) 0.8 MG TABS tablet Take 1 tablet by mouth daily.   09/03/2018 at am  . cinacalcet (SENSIPAR) 60 MG tablet Take 60 mg by mouth daily.    09/03/2018 at am  . clopidogrel (PLAVIX) 75 MG tablet Take 1 tablet (75 mg total) by mouth daily. 30 tablet 11 09/03/2018 at 0800  . ferrous sulfate 325 (65 FE) MG EC tablet Take 325 mg by mouth  3 (three) times daily with meals.   09/03/2018 at am  . glipiZIDE (GLUCOTROL) 10 MG tablet Take 10 mg by mouth daily.    09/03/2018 at am  . lidocaine-prilocaine (EMLA) cream Apply 1 application topically as needed (port access).   09/02/2018 at Unknown time  . omeprazole (PRILOSEC) 40 MG capsule Take 40 mg by mouth 2 (two) times daily.    09/03/2018 at am  . polyethylene glycol (MIRALAX / GLYCOLAX) packet Take 17 g by mouth 2 (two) times daily.    09/03/2018 at am  . sevelamer carbonate (RENVELA) 800 MG tablet Take 2,400 mg by mouth 3 (three) times daily with meals.    09/03/2018 at am  . traMADol (ULTRAM) 50 MG tablet Take 1 tablet (  50 mg total) by mouth every 6 (six) hours as needed. (Patient taking differently: Take 50 mg by mouth every 6 (six) hours as needed for moderate pain or severe pain. ) 20 tablet 0 unknown at unknown   [place] a 57 y.o.malewith a history of chronic kidney disease, diabetes as well as peripheral arterial disease was presented emergency department the cold and blue right lower extremity. The patient, several weeks ago, had a balloon angioplasty and thrombectomy and says that his symptoms are improving. His wife states that he was "growing new skin." Warmth also return to the leg. However, over the past 3 days the leg has started to become blue and cold again. The patient is not complaining any pain at this time. Came to the emergency department for further evaluation and treatment. Per chart review no history of PTA anticoagulation. Hgb trending down (13.2 > 12.1>11.7), will continue to follow  Heparin Course 11/20 Initiation 4900 bolus, then 1600 units/hr 11/21 0146 HL 0.25: inc rate to 1800 units/hr 11/21 0920 HL 0.24:  11/21 drip held due to angio gram 11/21 1854 Drip restarted at previous rate of 2000 units/hr 11/22 0300 HL 0.23 rate inc 2150 unit/hr 11/22 1024 HL 0.23  Goal of Therapy:  Heparin level 0.3-0.7 units/ml Monitor platelets by  anticoagulation protocol: Yes   Plan:  11/22 @ 1024 HL 0.23 subtherapeutic. Will increase rate to 2350 units/hr and will recheck HL in 6 hours. Will continue to monitor CBC as patient is post-angiogram.  11/22:  HL @ 20:00 = 0.45 Will continue this pt on current drip rate and recheck HL on 11/23 @ 0400.  Beyonca Wisz D 09/05/2018,9:40 PM

## 2018-09-05 NOTE — Progress Notes (Addendum)
[facility]  Daily Progress Note   Subjective: 1 Day Post-Op: Ultrasound guidance for vascular access left femoral artery, Catheter placement into right SFA from left femoral approach, Aortogram and selective right lower extremity angiogram, Catheter directed thrombolytic therapy with 8 mg of TPA delivered to the right SFA and popliteal arteries with the penumbra cat 6 device, Mechanical thrombectomy of the right SFA, popliteal artery, tibioperoneal trunk, and peroneal artery as well as the profunda femoris artery with the penumbra cat 6 device, Covered stent placement of the right peroneal artery, tibioperoneal trunk, and popliteal artery with 5 mm diameter by 25 cm length covered stent for residual occlusive thrombus after thrombectomy, Covered stent placement to the right SFA with a 6 mm diameter by 25 cm length stent and a 6 mm diameter by 5 cm length stent for residual occlusive thrombus after above procedures with [employer] closure device left femoral artery  Patient interviewed with interpreter present.  I had a long conversation with the patient and his wife regarding the procedure from yesterday, the multiple interventions tried to restore blood flow in the results.  I explained to him that at the end of the procedure there is essentially no distal runoff.  I explained to the patient that interventions to the thigh were successful enough to allow for healing of an above-the-knee amputation.  He does not have adequate blood flow for a below the knee amputation.  The patient is refusing an above-the-knee amputation.  He states that he will consent to only a below the knee amputation.  Again, with the use of the interpreter present I explained that he did not have adequate blood flow to heal a below the knee amputation.  Without adequate blood flow the stump will fail and he was at high risk for loss of tissue, infection, sepsis and death.  The patient continued to insist that we  could do a below the knee amputation we "just did not want to help him".  Again, I explained that it was not an issue of not wanting to help him but an issue of his anatomy and blood flow.  I tried to explain with the use of the interpreter numerous times that he does not have the blood flow to heal up below the knee amputation.  I spoke with Dr. Lucky Cowboy to inform him that the patient was refusing an above-the-knee amputation and only consented to a below the knee amputation.  It is not ethical to proceed with a below the knee amputation knowing that the patient does not have adequate blood flow to heal.  This was also explained to the patient and his wife that it would be unethical to move forward performing a procedure that would ultimately hurt him and not help him.  At this time, the patient is insisting on seeing Dr. Elvina Mattes.  The patient is not consenting to an above-the-knee amputation he will only consent to a below the knee amputation.  The patient was told that he has every right to get a second opinion and he is not being forced to have an above-the-knee amputation.  It is our recommendation after assessing his arterial patency that he would not heal a below the knee amputation.  Vascular surgery is recommending aright above-the-knee amputation.  Objective: Vitals:   09/04/18 1726 09/04/18 1733 09/04/18 2058 09/05/18 0533  BP:  136/84 113/87 128/62  Pulse: (!) 108 (!) 108 (!) 107 98  Resp:  16 18 15   Temp:  97.6  F (36.4 C) 98.5 F (36.9 C) 98.4 F (36.9 C)  TempSrc:  Oral Oral Oral  SpO2: 94% 93% 90% 92%  Weight:      Height:        Intake/Output Summary (Last 24 hours) at 09/05/2018 1136 Last data filed at 09/05/2018 1040 Gross per 24 hour  Intake 531.76 ml  Output 0 ml  Net 531.76 ml   Physical Exam: A&Ox3, NAD CV: RRR Pulmonary: CTA Bilaterally Abdomen: Soft, Nontender, Nondistended Vascular:  Right Lower Extremity: Thigh soft. Calf soft. Transmetarsal amputation. Mottled,  blue foot. No palpable pulses. Thigh is warm  Left Lower Extremity: Thigh soft. BKA healthy. Left Groin: Without drainage, bleeding, swelling   Laboratory: CBC    Component Value Date/Time   WBC 13.6 (H) 09/05/2018 0256   HGB 11.1 (L) 09/05/2018 0256   HCT 36.6 (L) 09/05/2018 0256   PLT 288 09/05/2018 0256   BMET    Component Value Date/Time   NA 137 09/04/2018 0146   K 5.3 (H) 09/04/2018 0146   K 6.6 (HH) 03/10/2013 1351   CL 96 (L) 09/04/2018 0146   CO2 29 09/04/2018 0146   GLUCOSE 168 (H) 09/04/2018 0146   BUN 35 (H) 09/04/2018 0146   CREATININE 6.26 (H) 09/04/2018 0146   CALCIUM 9.1 09/04/2018 0146   GFRNONAA 9 (L) 09/04/2018 0146   GFRAA 10 (L) 09/04/2018 0146   Assessment/Planning: Patient is a male with multiple medical issues presented to [facility] emergency department with a right ischemic leg.  The patient was admitted, treated with heparin and underwent a right lower extremity angiogram with extensive intervention yesterday however at the end of the procedure there remained essentially no flow distally. 1) The procedure, interventions and results were explained in detail to the patient and his wife were at the bedside with the assistance of an interpreter. (See above) 2) At this point, vascular surgery service feels that any further endovascular interventions would be futile.  There is enough arterial patency to heal a right above-the-knee amputation.  Vascular surgery recommends a right above-the-knee amputation.  Again, this was all explained to the patient and his wife at the bedside.  The patient is refusing an above-knee amputation, only will consent to a below-knee amputation.  Dr. Lucky Cowboy feels that it is unethical to proceed with a below the knee amputation as this will harm the patient and not helped him.  Risks of not undergoing an amputation of any kind was explained to the patient. (See above).  3) At this time, vascular surgery will sign  off.  If the patient changes his mind and would like to proceed with an above-the-knee amputation our service will be happy to facilitate.  Discussed with Dr. Ellis Parents Stegmayer PA-C 09/05/2018 11:36 AM    I agree with the above assessment of the patient.  I discussed with the patient through the help of the interpreter service Kennyth Lose L.) yesterday evening after the procedure and explained the situation.  He has essentially no blood flow from the mid to distal thigh down.  This is despite multiple interventions and procedures.  There is not an adequate distal target for bypass.  I do not feel there is any hope with limb salvage.  A BKA would not be expected to heal and it would be inappropriate to perform a surgery that I would expect to fail.  At this time, if the patient continues to refuse an above-knee amputation I do not have much further  that I can offer.  If he desires a second opinion that is certainly reasonable and appropriate.  If he decides to proceed with above-knee amputation, I will be happy to perform this next week.  Leotis Pain, MD

## 2018-09-06 LAB — CBC
HEMATOCRIT: 33.6 % — AB (ref 39.0–52.0)
Hemoglobin: 10 g/dL — ABNORMAL LOW (ref 13.0–17.0)
MCH: 29.2 pg (ref 26.0–34.0)
MCHC: 29.8 g/dL — ABNORMAL LOW (ref 30.0–36.0)
MCV: 98.2 fL (ref 80.0–100.0)
NRBC: 0.4 % — AB (ref 0.0–0.2)
Platelets: 254 10*3/uL (ref 150–400)
RBC: 3.42 MIL/uL — AB (ref 4.22–5.81)
RDW: 16.2 % — AB (ref 11.5–15.5)
WBC: 12.8 10*3/uL — ABNORMAL HIGH (ref 4.0–10.5)

## 2018-09-06 LAB — GLUCOSE, CAPILLARY
GLUCOSE-CAPILLARY: 183 mg/dL — AB (ref 70–99)
Glucose-Capillary: 171 mg/dL — ABNORMAL HIGH (ref 70–99)
Glucose-Capillary: 189 mg/dL — ABNORMAL HIGH (ref 70–99)
Glucose-Capillary: 181 mg/dL — ABNORMAL HIGH (ref 70–99)

## 2018-09-06 LAB — HEPARIN LEVEL (UNFRACTIONATED)
HEPARIN UNFRACTIONATED: 0.45 [IU]/mL (ref 0.30–0.70)
Heparin Unfractionated: 0.28 IU/mL — ABNORMAL LOW (ref 0.30–0.70)
Heparin Unfractionated: 0.5 IU/mL (ref 0.30–0.70)

## 2018-09-06 MED ORDER — HEPARIN BOLUS VIA INFUSION
1350.0000 [IU] | Freq: Once | INTRAVENOUS | Status: DC
  Filled 2018-09-06: qty 1350

## 2018-09-06 MED ORDER — SODIUM ZIRCONIUM CYCLOSILICATE 10 G PO PACK
10.0000 g | PACK | Freq: Every day | ORAL | Status: DC
  Administered 2018-09-06 – 2018-09-12 (×5): 10 g via ORAL
  Filled 2018-09-06 (×7): qty 1

## 2018-09-06 MED ORDER — SODIUM CHLORIDE 0.9 % IV SOLN
INTRAVENOUS | Status: DC | PRN
  Administered 2018-09-06 – 2018-09-10 (×6): 250 mL via INTRAVENOUS
  Administered 2018-09-13: 500 mL via INTRAVENOUS

## 2018-09-06 NOTE — Progress Notes (Signed)
ANTICOAGULATION CONSULT NOTE   Pharmacy Consult for Heparin  Indication: ischemic limb  No Known Allergies  Patient Measurements: Height: 5\' 10"  (177.8 cm) Weight: 249 lb 1.9 oz (113 kg) IBW/kg (Calculated) : 73 Heparin Dosing Weight: 97.8 kg   Vital Signs: Temp: 98.7 F (37.1 C) (11/23 1105) Temp Source: Oral (11/23 1105) BP: 129/72 (11/23 1105) Pulse Rate: 97 (11/23 1105)  Labs: Recent Labs    09/03/18 1600 09/03/18 1607  09/04/18 0146 09/04/18 0920 09/05/18 0256  09/05/18 1202 09/05/18 1946 09/06/18 0520 09/06/18 1324  HGB 13.2  --   --  12.1* 11.7* 11.1*  --   --   --  10.0*  --   HCT 43.0  --   --  40.1 38.5* 36.6*  --   --   --  33.6*  --   PLT 309  --   --  277 272 288  --   --   --  254  --   APTT  --  34  --   --   --   --   --   --   --   --   --   LABPROT  --  14.7  --   --   --   --   --   --   --   --   --   INR  --  1.16  --   --   --   --   --   --   --   --   --   HEPARINUNFRC  --   --    < > 0.25* 0.24* 0.23*   < >  --  0.45 0.28* 0.50  CREATININE 5.27*  --   --  6.26*  --   --   --  7.45*  --   --   --    < > = values in this interval not displayed.    Estimated Creatinine Clearance: 13.6 mL/min (A) (by C-G formula based on SCr of 7.45 mg/dL (H)).   Medical History: Past Medical History:  Diagnosis Date  . Anemia   . Atherosclerosis   . Chronic kidney disease   . Diabetes mellitus without complication ([place])   . Hypercholesteremia   . Hypertension   . Hypotension   . Stroke [facility])     Medications:  Medications Prior to Admission  Medication Sig Dispense Refill Last Dose  . aspirin EC 81 MG tablet Take 81 mg by mouth daily.   09/03/2018 at am  . b complex-vitamin c-folic acid (NEPHRO-VITE) 0.8 MG TABS tablet Take 1 tablet by mouth daily.   09/03/2018 at am  . cinacalcet (SENSIPAR) 60 MG tablet Take 60 mg by mouth daily.    09/03/2018 at am  . clopidogrel (PLAVIX) 75 MG tablet Take 1 tablet (75 mg total) by mouth daily. 30 tablet 11  09/03/2018 at 0800  . ferrous sulfate 325 (65 FE) MG EC tablet Take 325 mg by mouth 3 (three) times daily with meals.   09/03/2018 at am  . glipiZIDE (GLUCOTROL) 10 MG tablet Take 10 mg by mouth daily.    09/03/2018 at am  . lidocaine-prilocaine (EMLA) cream Apply 1 application topically as needed (port access).   09/02/2018 at Unknown time  . omeprazole (PRILOSEC) 40 MG capsule Take 40 mg by mouth 2 (two) times daily.    09/03/2018 at am  . polyethylene glycol (MIRALAX / GLYCOLAX) packet Take 17 g by mouth 2 (two) times  daily.    09/03/2018 at am  . sevelamer carbonate (RENVELA) 800 MG tablet Take 2,400 mg by mouth 3 (three) times daily with meals.    09/03/2018 at am  . traMADol (ULTRAM) 50 MG tablet Take 1 tablet (50 mg total) by mouth every 6 (six) hours as needed. (Patient taking differently: Take 50 mg by mouth every 6 (six) hours as needed for moderate pain or severe pain. ) 20 tablet 0 unknown at unknown   [employer] a 57 y.o.malewith a history of chronic kidney disease, diabetes as well as peripheral arterial disease was presented emergency department the cold and blue right lower extremity. The patient, several weeks ago, had a balloon angioplasty and thrombectomy and says that his symptoms are improving. His wife states that he was "growing new skin." Warmth also return to the leg. However, over the past 3 days the leg has started to become blue and cold again. The patient is not complaining any pain at this time. Came to the emergency department for further evaluation and treatment. Per chart review no history of PTA anticoagulation. Hgb trending down (13.2 > 12.1>11.7), will continue to follow  Heparin Course 11/20 Initiation 4900 bolus, then 1600 units/hr 11/21 0146 HL 0.25: inc rate to 1800 units/hr 11/21 0920 HL 0.24:  11/21 drip held due to angio gram 11/21 1854 Drip restarted at previous rate of 2000 units/hr 11/22 0300 HL 0.23 rate inc 2150 unit/hr 11/22 1024 HL  0.23 11/23 0530 HL 0.28  Goal of Therapy:  Heparin level 0.3-0.7 units/ml Monitor platelets by anticoagulation protocol: Yes   Plan:  11/23 @ 1324 HL= 0.50. Will continue current rate. Will f/u confirmatory level in 8 hours.  Chinita Greenland PharmD Clinical Pharmacist 09/06/2018

## 2018-09-06 NOTE — Progress Notes (Signed)
[place] [facility] [place]: [name]    MR#:  [id number]  DATE OF BIRTH:  1961/02/18  SUBJECTIVE:  CHIEF COMPLAINT:  No chief complaint on file.   Came with Right foot pain, cold, gangrenous.  on heparin drip, status post angioplasty Agreeable for above-knee amputation on Monday Wife at bedside  REVIEW OF SYSTEMS:  CONSTITUTIONAL: No fever, fatigue or weakness.  EYES: No blurred or double vision.  EARS, NOSE, AND THROAT: No tinnitus or ear pain.  RESPIRATORY: No cough, shortness of breath, wheezing or hemoptysis.  CARDIOVASCULAR: No chest pain, orthopnea, edema.  GASTROINTESTINAL: No nausea, vomiting, diarrhea or abdominal pain.  GENITOURINARY: No dysuria, hematuria.  ENDOCRINE: No polyuria, nocturia,  HEMATOLOGY: No anemia, easy bruising or bleeding SKIN: No rash or lesion. MUSCULOSKELETAL: No joint pain or arthritis.   NEUROLOGIC: No tingling, numbness, weakness.  PSYCHIATRY: No anxiety or depression.   ROS  DRUG ALLERGIES:  No Known Allergies  VITALS:  Blood pressure 129/72, pulse 97, temperature 98.7 F (37.1 C), temperature source Oral, resp. rate 18, height 5\' 10"  (1.778 m), weight 113 kg, SpO2 93 %.  PHYSICAL EXAMINATION:   GENERAL:  57 y.o.-year-old patient lying in the bed with no acute distress.  EYES: Pupils equal, round, reactive to light and accommodation. No scleral icterus. Extraocular muscles intact.  HEENT: Head atraumatic, normocephalic. Oropharynx and nasopharynx clear.  NECK:  Supple, no jugular venous distention. No thyroid enlargement, no tenderness.  LUNGS: Normal breath sounds bilaterally, no wheezing, rales,rhonchi or crepitation. No use of accessory muscles of respiration.  CARDIOVASCULAR: S1, S2 normal. No murmurs, rubs, or gallops.  ABDOMEN: Soft, nontender, nondistended. Bowel sounds present. EXTREMITIES: No pedal edema, cold right leg and foot.  Left side status post below-knee amputation and  right side transmetatarsal amputation. NEUROLOGIC: Cranial nerves II through XII are intact. Muscle strength 4/5 in all extremities. Sensation intact. Gait not checked.  PSYCHIATRIC: The patient is alert and oriented x 3.  SKIN: No obvious rash, lesion, or ulcer.   Physical Exam LABORATORY PANEL:   CBC Recent Labs  Lab 09/06/18 0520  WBC 12.8*  HGB 10.0*  HCT 33.6*  PLT 254   ------------------------------------------------------------------------------------------------------------------  Chemistries  Recent Labs  Lab 09/05/18 1202  NA 135  K 5.8*  CL 96*  CO2 28  GLUCOSE 183*  BUN 45*  CREATININE 7.45*  CALCIUM 8.7*   ------------------------------------------------------------------------------------------------------------------  Cardiac Enzymes No results for input(s): TROPONINI in the last 168 hours. ------------------------------------------------------------------------------------------------------------------  RADIOLOGY:  No results found.  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 Status post angiogram and vascular is recommending right leg AKA on Monday.  Patient and his wife are agreeable Currently IV heparin drip. Continue aspirin, hold Plavix for now.  *Diabetes mellitus with complications of atherosclerosis and peripheral arteries I will hold oral meds and keep on sliding scale coverage.  *End-stage renal disease on hemodialysis Nephrologist to help with continuing dialysis in the hospital. Continue supplemental medications as per nephrologist.  *Hyperkalemia Patient is a dialysis patient and nephrology is following  All the records are reviewed and case discussed with Care Management/Social  Workerr. Management plans discussed with the patient, family with the help of Spanish interpreter McKee and they are in agreement.  CODE STATUS: Full.  TOTAL TIME TAKING  CARE OF THIS PATIENT: 35 minutes.     POSSIBLE D/C IN ? DAYS, DEPENDING ON CLINICAL CONDITION.   [name] M.D on 09/06/2018   Between 7am to 6pm - Pager - [phone]   After 6pm go to [email or web address] - password [email or web address] [facility]  Office  [phone]  CC: Primary care physician; [name], MD  Note: This dictation was prepared with Dragon dictation along with smaller phrase technology. Any transcriptional errors that result from this process are unintentional.

## 2018-09-06 NOTE — Progress Notes (Signed)
ANTICOAGULATION CONSULT NOTE   Pharmacy Consult for Heparin  Indication: ischemic limb  No Known Allergies  Patient Measurements: Height: 5\' 10"  (177.8 cm) Weight: 249 lb 1.9 oz (113 kg) IBW/kg (Calculated) : 73 Heparin Dosing Weight: 97.8 kg   Vital Signs: Temp: 99.6 F (37.6 C) (11/23 1925) Temp Source: Oral (11/23 1925) BP: 109/69 (11/23 1925) Pulse Rate: 88 (11/23 1925)  Labs: Recent Labs    09/04/18 0146 09/04/18 0920 09/05/18 0256  09/05/18 1202  09/06/18 0520 09/06/18 1324 09/06/18 2113  HGB 12.1* 11.7* 11.1*  --   --   --  10.0*  --   --   HCT 40.1 38.5* 36.6*  --   --   --  33.6*  --   --   PLT 277 272 288  --   --   --  254  --   --   HEPARINUNFRC 0.25* 0.24* 0.23*   < >  --    < > 0.28* 0.50 0.45  CREATININE 6.26*  --   --   --  7.45*  --   --   --   --    < > = values in this interval not displayed.    Estimated Creatinine Clearance: 13.6 mL/min (A) (by C-G formula based on SCr of 7.45 mg/dL (H)).   Medical History: Past Medical History:  Diagnosis Date  . Anemia   . Atherosclerosis   . Chronic kidney disease   . Diabetes mellitus without complication ([place])   . Hypercholesteremia   . Hypertension   . Hypotension   . Stroke [facility])     Medications:  Medications Prior to Admission  Medication Sig Dispense Refill Last Dose  . aspirin EC 81 MG tablet Take 81 mg by mouth daily.   09/03/2018 at am  . b complex-vitamin c-folic acid (NEPHRO-VITE) 0.8 MG TABS tablet Take 1 tablet by mouth daily.   09/03/2018 at am  . cinacalcet (SENSIPAR) 60 MG tablet Take 60 mg by mouth daily.    09/03/2018 at am  . clopidogrel (PLAVIX) 75 MG tablet Take 1 tablet (75 mg total) by mouth daily. 30 tablet 11 09/03/2018 at 0800  . ferrous sulfate 325 (65 FE) MG EC tablet Take 325 mg by mouth 3 (three) times daily with meals.   09/03/2018 at am  . glipiZIDE (GLUCOTROL) 10 MG tablet Take 10 mg by mouth daily.    09/03/2018 at am  . lidocaine-prilocaine (EMLA) cream Apply 1  application topically as needed (port access).   09/02/2018 at Unknown time  . omeprazole (PRILOSEC) 40 MG capsule Take 40 mg by mouth 2 (two) times daily.    09/03/2018 at am  . polyethylene glycol (MIRALAX / GLYCOLAX) packet Take 17 g by mouth 2 (two) times daily.    09/03/2018 at am  . sevelamer carbonate (RENVELA) 800 MG tablet Take 2,400 mg by mouth 3 (three) times daily with meals.    09/03/2018 at am  . traMADol (ULTRAM) 50 MG tablet Take 1 tablet (50 mg total) by mouth every 6 (six) hours as needed. (Patient taking differently: Take 50 mg by mouth every 6 (six) hours as needed for moderate pain or severe pain. ) 20 tablet 0 unknown at unknown   [employer] a 57 y.o.malewith a history of chronic kidney disease, diabetes as well as peripheral arterial disease was presented emergency department the cold and blue right lower extremity. The patient, several weeks ago, had a balloon angioplasty and thrombectomy and says  that his symptoms are improving. His wife states that he was "growing new skin." Warmth also return to the leg. However, over the past 3 days the leg has started to become blue and cold again. The patient is not complaining any pain at this time. Came to the emergency department for further evaluation and treatment. Per chart review no history of PTA anticoagulation. Hgb trending down (13.2 > 12.1>11.7), will continue to follow  Heparin Course 11/20 Initiation 4900 bolus, then 1600 units/hr 11/21 0146 HL 0.25: inc rate to 1800 units/hr 11/21 0920 HL 0.24:  11/21 drip held due to angio gram 11/21 1854 Drip restarted at previous rate of 2000 units/hr 11/22 0300 HL 0.23 rate inc 2150 unit/hr 11/22 1024 HL 0.23 11/23 0530 HL 0.28 11/23 1324 HL 0.50 11/23 2113 HL 0.45  Goal of Therapy:  Heparin level 0.3-0.7 units/ml Monitor platelets by anticoagulation protocol: Yes   Plan:  11/23 @ 2113 HL= 0.45. Will continue current rate. Will draw heparin level and CBC  daily.  Evelena Asa, PharmD Clinical Pharmacist 09/06/2018

## 2018-09-06 NOTE — Progress Notes (Signed)
ANTICOAGULATION CONSULT NOTE - Initial Consult  Pharmacy Consult for Heparin  Indication: ischemic limb  No Known Allergies  Patient Measurements: Height: 5\' 10"  (177.8 cm) Weight: 249 lb 1.9 oz (113 kg) IBW/kg (Calculated) : 73 Heparin Dosing Weight: 97.8 kg   Vital Signs: Temp: 99.2 F (37.3 C) (11/23 0448) Temp Source: Oral (11/23 0448) BP: 99/51 (11/23 0448) Pulse Rate: 95 (11/23 0448)  Labs: Recent Labs    09/03/18 1600 09/03/18 1607  09/04/18 0146 09/04/18 0920 09/05/18 0256 09/05/18 1024 09/05/18 1202 09/05/18 1946 09/06/18 0520  HGB 13.2  --   --  12.1* 11.7* 11.1*  --   --   --  10.0*  HCT 43.0  --   --  40.1 38.5* 36.6*  --   --   --  33.6*  PLT 309  --   --  277 272 288  --   --   --  254  APTT  --  34  --   --   --   --   --   --   --   --   LABPROT  --  14.7  --   --   --   --   --   --   --   --   INR  --  1.16  --   --   --   --   --   --   --   --   HEPARINUNFRC  --   --    < > 0.25* 0.24* 0.23* 0.23*  --  0.45 0.28*  CREATININE 5.27*  --   --  6.26*  --   --   --  7.45*  --   --    < > = values in this interval not displayed.    Estimated Creatinine Clearance: 13.6 mL/min (A) (by C-G formula based on SCr of 7.45 mg/dL (H)).   Medical History: Past Medical History:  Diagnosis Date  . Anemia   . Atherosclerosis   . Chronic kidney disease   . Diabetes mellitus without complication ([place])   . Hypercholesteremia   . Hypertension   . Hypotension   . Stroke [facility])     Medications:  Medications Prior to Admission  Medication Sig Dispense Refill Last Dose  . aspirin EC 81 MG tablet Take 81 mg by mouth daily.   09/03/2018 at am  . b complex-vitamin c-folic acid (NEPHRO-VITE) 0.8 MG TABS tablet Take 1 tablet by mouth daily.   09/03/2018 at am  . cinacalcet (SENSIPAR) 60 MG tablet Take 60 mg by mouth daily.    09/03/2018 at am  . clopidogrel (PLAVIX) 75 MG tablet Take 1 tablet (75 mg total) by mouth daily. 30 tablet 11 09/03/2018 at 0800  . ferrous  sulfate 325 (65 FE) MG EC tablet Take 325 mg by mouth 3 (three) times daily with meals.   09/03/2018 at am  . glipiZIDE (GLUCOTROL) 10 MG tablet Take 10 mg by mouth daily.    09/03/2018 at am  . lidocaine-prilocaine (EMLA) cream Apply 1 application topically as needed (port access).   09/02/2018 at Unknown time  . omeprazole (PRILOSEC) 40 MG capsule Take 40 mg by mouth 2 (two) times daily.    09/03/2018 at am  . polyethylene glycol (MIRALAX / GLYCOLAX) packet Take 17 g by mouth 2 (two) times daily.    09/03/2018 at am  . sevelamer carbonate (RENVELA) 800 MG tablet Take 2,400 mg by mouth 3 (three) times daily  with meals.    09/03/2018 at am  . traMADol (ULTRAM) 50 MG tablet Take 1 tablet (50 mg total) by mouth every 6 (six) hours as needed. (Patient taking differently: Take 50 mg by mouth every 6 (six) hours as needed for moderate pain or severe pain. ) 20 tablet 0 unknown at unknown   [employer] a 57 y.o.malewith a history of chronic kidney disease, diabetes as well as peripheral arterial disease was presented emergency department the cold and blue right lower extremity. The patient, several weeks ago, had a balloon angioplasty and thrombectomy and says that his symptoms are improving. His wife states that he was "growing new skin." Warmth also return to the leg. However, over the past 3 days the leg has started to become blue and cold again. The patient is not complaining any pain at this time. Came to the emergency department for further evaluation and treatment. Per chart review no history of PTA anticoagulation. Hgb trending down (13.2 > 12.1>11.7), will continue to follow  Heparin Course 11/20 Initiation 4900 bolus, then 1600 units/hr 11/21 0146 HL 0.25: inc rate to 1800 units/hr 11/21 0920 HL 0.24:  11/21 drip held due to angio gram 11/21 1854 Drip restarted at previous rate of 2000 units/hr 11/22 0300 HL 0.23 rate inc 2150 unit/hr 11/22 1024 HL 0.23  Goal of Therapy:   Heparin level 0.3-0.7 units/ml Monitor platelets by anticoagulation protocol: Yes   Plan:  11/23 @ 0530 HL 0.28 subtherapeutic. Will increase rate to 2450 units/hr and will recheck HL @ 1200. Hgb trending down will continue to monitor.  Tobie Lords, PharmD, BCPS Clinical Pharmacist 09/06/2018

## 2018-09-06 NOTE — Progress Notes (Signed)
[facility]  ROUNDING NOTE   Subjective:   Wife at bedside. Hemodialysis treatment yesterday. Tolerated treatment well but did have some hypotension. UF of  562mL.   Objective:  Vital signs in last 24 hours:  Temp:  [98.7 F (37.1 C)-99.6 F (37.6 C)] 98.7 F (37.1 C) (11/23 1105) Pulse Rate:  [95-103] 97 (11/23 1105) Resp:  [11-20] 18 (11/23 1105) BP: (76-138)/(37-107) 129/72 (11/23 1105) SpO2:  [93 %-100 %] 93 % (11/23 1105)  Weight change:  Filed Weights   09/03/18 1538  Weight: 113 kg    Intake/Output: I/O last 3 completed shifts: In: 1275.8 [P.O.:480; I.V.:701.8; IV Piggyback:94] Out: 500 [Other:500]   Intake/Output this shift:  No intake/output data recorded.  Physical Exam: General: NAD,   Head: Normocephalic, atraumatic. Moist oral mucosal membranes  Eyes: Anicteric, PERRL  Neck: Supple, trachea midline  Lungs:  Clear to auscultation  Heart: Regular rate and rhythm  Abdomen:  Soft, nontender,   Extremities: Right lower extremity with pain and cold to touch, left BKA  Neurologic: Nonfocal, moving all four extremities  Skin: No lesions  Access: AVF    Basic Metabolic Panel: Recent Labs  Lab 09/03/18 1600 09/04/18 0146 09/05/18 1202  NA 136 137 135  K 5.7* 5.3* 5.8*  CL 93* 96* 96*  CO2 28 29 28   GLUCOSE 228* 168* 183*  BUN 25* 35* 45*  CREATININE 5.27* 6.26* 7.45*  CALCIUM 9.1 9.1 8.7*  PHOS  --   --  5.6*    Liver Function Tests: Recent Labs  Lab 09/05/18 1202  ALBUMIN 3.5   No results for input(s): LIPASE, AMYLASE in the last 168 hours. No results for input(s): AMMONIA in the last 168 hours.  CBC: Recent Labs  Lab 09/03/18 1600 09/04/18 0146 09/04/18 0920 09/05/18 0256 09/06/18 0520  WBC 13.4* 11.2* 12.0* 13.6* 12.8*  NEUTROABS 10.5*  --   --   --   --   HGB 13.2 12.1* 11.7* 11.1* 10.0*  HCT 43.0 40.1 38.5* 36.6* 33.6*  MCV 97.3 97.8 97.2 97.6 98.2  PLT 309 277 272 288 254    Cardiac Enzymes: No results for  input(s): CKTOTAL, CKMB, CKMBINDEX, TROPONINI in the last 168 hours.  BNP: Invalid input(s): POCBNP  CBG: Recent Labs  Lab 09/05/18 1158 09/05/18 1637 09/05/18 2152 09/06/18 0759 09/06/18 1106  GLUCAP 177* 166* 185* 171* 183*    Microbiology: Results for orders placed or performed during the hospital encounter of 08/12/18  MRSA PCR Screening     Status: None   Collection Time: 08/13/18  9:08 AM  Result Value Ref Range Status   MRSA by PCR NEGATIVE NEGATIVE Final    Comment:        The GeneXpert MRSA Assay (FDA approved for NASAL specimens only), is one component of a comprehensive MRSA colonization surveillance program. It is not intended to diagnose MRSA infection nor to guide or monitor treatment for MRSA infections. Performed at [facility], [address] [place], [place] [address]     Coagulation Studies: Recent Labs    09/03/18 1607  LABPROT 14.7  INR 1.16    Urinalysis: No results for input(s): COLORURINE, LABSPEC, PHURINE, GLUCOSEU, HGBUR, BILIRUBINUR, KETONESUR, PROTEINUR, UROBILINOGEN, NITRITE, LEUKOCYTESUR in the last 72 hours.  Invalid input(s): APPERANCEUR    Imaging: No results found.   Medications:   . small volume/piggyback Museum/gallery curator    . heparin 2,450 Units/hr (09/06/18 0630)   . aspirin EC  81 mg Oral Daily  .  cinacalcet  60 mg Oral Once per day on Mon Wed Fri  . feeding supplement (NEPRO CARB STEADY)  237 mL Oral BID BM  . ferrous sulfate  325 mg Oral TID WC  . insulin aspart  0-9 Units Subcutaneous TID WC  . midodrine  5 mg Oral TID WC  . multivitamin  1 tablet Oral QHS  . multivitamin-lutein  1 capsule Oral Daily  . pantoprazole  40 mg Oral BID  . polyethylene glycol  17 g Oral BID  . senna-docusate  1 tablet Oral BID  . sevelamer carbonate  2,400 mg Oral TID WC  . [START ON 09/09/2018] vitamin C  250 mg Oral BID   docusate sodium, HYDROmorphone (DILAUDID) injection **OR** HYDROmorphone (DILAUDID) injection,  lidocaine-prilocaine, ondansetron (ZOFRAN) IV, traMADol  Assessment/ Plan:  [name] is a 57 y.o. Hispanic male with end stage renal disease, peripheral vascular disease, hypertension, CVA, hyperlipidemia, diabetes mellitus type II who is admitted to [facility]   MWF [place] 113kg Left AVF  1. End Stage Renal Disease:  MWF schedule. With hyperkalemia Hemodialysis for Monday.  Midodrine before treatment.  - start lokelma for hyperkalemia.   2. Anemia of chronic kidney disease: hemoglobin 10 - EPO as outpatient. Would hold due to ischemia  3. Hypotension - Patient takes midodrine before his dialysis treatments - home regimen of amlodipine  4. Secondary Hyperparathyroidism:  - sevelamer and calcium acetate with meals.  - cinacalcet on hemodialysis days.   5. Peripheral arterial disease: amputation scheduled for 11/25.  - Appreciate vascular input.  - Heparin gtt.   6. Diabetes mellitus type II with chronic kidney disease: Continue glucose control.    LOS: 3 [name] 11/23/20191:21 PM

## 2018-09-07 ENCOUNTER — Encounter: Payer: Self-pay | Admitting: Anesthesiology

## 2018-09-07 DIAGNOSIS — I744 Embolism and thrombosis of arteries of extremities, unspecified: Secondary | ICD-10-CM

## 2018-09-07 LAB — CBC
HCT: 32.4 % — ABNORMAL LOW (ref 39.0–52.0)
Hemoglobin: 9.9 g/dL — ABNORMAL LOW (ref 13.0–17.0)
MCH: 29.6 pg (ref 26.0–34.0)
MCHC: 30.6 g/dL (ref 30.0–36.0)
MCV: 96.7 fL (ref 80.0–100.0)
PLATELETS: 259 10*3/uL (ref 150–400)
RBC: 3.35 MIL/uL — ABNORMAL LOW (ref 4.22–5.81)
RDW: 16 % — ABNORMAL HIGH (ref 11.5–15.5)
WBC: 14.2 10*3/uL — ABNORMAL HIGH (ref 4.0–10.5)
nRBC: 0.1 % (ref 0.0–0.2)

## 2018-09-07 LAB — GLUCOSE, CAPILLARY
GLUCOSE-CAPILLARY: 192 mg/dL — AB (ref 70–99)
Glucose-Capillary: 192 mg/dL — ABNORMAL HIGH (ref 70–99)
Glucose-Capillary: 197 mg/dL — ABNORMAL HIGH (ref 70–99)
Glucose-Capillary: 195 mg/dL — ABNORMAL HIGH (ref 70–99)

## 2018-09-07 LAB — HEPARIN LEVEL (UNFRACTIONATED)
HEPARIN UNFRACTIONATED: 0.29 [IU]/mL — AB (ref 0.30–0.70)
HEPARIN UNFRACTIONATED: 0.37 [IU]/mL (ref 0.30–0.70)
Heparin Unfractionated: 0.35 IU/mL (ref 0.30–0.70)

## 2018-09-07 MED ORDER — ROFLUMILAST 500 MCG PO TABS
500.0000 ug | ORAL_TABLET | Freq: Every day | ORAL | Status: DC
  Administered 2018-09-07 – 2018-09-09 (×2): 500 ug via ORAL
  Filled 2018-09-07 (×5): qty 1

## 2018-09-07 MED ORDER — ACETAMINOPHEN 325 MG PO TABS
650.0000 mg | ORAL_TABLET | Freq: Four times a day (QID) | ORAL | Status: DC | PRN
  Administered 2018-09-07 – 2018-09-08 (×4): 650 mg via ORAL
  Filled 2018-09-07 (×4): qty 2

## 2018-09-07 MED ORDER — OXYCODONE HCL 5 MG PO TABS
5.0000 mg | ORAL_TABLET | Freq: Four times a day (QID) | ORAL | Status: DC | PRN
  Administered 2018-09-07 – 2018-09-11 (×7): 5 mg via ORAL
  Filled 2018-09-07 (×8): qty 1

## 2018-09-07 NOTE — Plan of Care (Addendum)
Pain has been and issue for the patient. PRN pain medication adjusted by the hospitalist. Tylenol order as the patient had a temp of 99.49F earlier and it was reduced to 99.67F. MD indicated to continue to monitor the patient. Monitoring Heparin drip. Awaiting for surgical procedure tomorrow and dialysis after procedure. Consent form singed and placed in the patient's chart.  NPO after midnight. The patient indicates he takes midodrine during dialysis.  Problem: Education: Goal: Knowledge of General Education information will improve Description Including pain rating scale, medication(s)/side effects and non-pharmacologic comfort measures Outcome: Progressing   Problem: Health Behavior/Discharge Planning: Goal: Ability to manage health-related needs will improve Outcome: Progressing   Problem: Clinical Measurements: Goal: Ability to maintain clinical measurements within normal limits will improve Outcome: Progressing Goal: Will remain free from infection Outcome: Progressing Goal: Diagnostic test results will improve Outcome: Progressing Goal: Respiratory complications will improve Outcome: Progressing Goal: Cardiovascular complication will be avoided Outcome: Progressing   Problem: Activity: Goal: Risk for activity intolerance will decrease Outcome: Progressing   Problem: Nutrition: Goal: Adequate nutrition will be maintained Outcome: Progressing   Problem: Coping: Goal: Level of anxiety will decrease Outcome: Progressing   Problem: Elimination: Goal: Will not experience complications related to bowel motility Outcome: Progressing Goal: Will not experience complications related to urinary retention Outcome: Progressing   Problem: Pain Managment: Goal: General experience of comfort will improve Outcome: Progressing   Problem: Safety: Goal: Ability to remain free from injury will improve Outcome: Progressing   Problem: Skin Integrity: Goal: Risk for impaired skin  integrity will decrease Outcome: Progressing

## 2018-09-07 NOTE — Progress Notes (Signed)
ANTICOAGULATION CONSULT NOTE   Pharmacy Consult for Heparin  Indication: ischemic limb  No Known Allergies  Patient Measurements: Height: 5\' 10"  (177.8 cm) Weight: 249 lb 1.9 oz (113 kg) IBW/kg (Calculated) : 73 Heparin Dosing Weight: 97.8 kg   Vital Signs: Temp: 99.3 F (37.4 C) (11/24 1300) Temp Source: Oral (11/24 1300) BP: 153/84 (11/24 1139) Pulse Rate: 98 (11/24 1139)  Labs: Recent Labs    09/05/18 0256  09/05/18 1202  09/06/18 0520  09/06/18 2113 09/07/18 0609 09/07/18 1310  HGB 11.1*  --   --   --  10.0*  --   --  9.9*  --   HCT 36.6*  --   --   --  33.6*  --   --  32.4*  --   PLT 288  --   --   --  254  --   --  259  --   HEPARINUNFRC 0.23*   < >  --    < > 0.28*   < > 0.45 0.29* 0.35  CREATININE  --   --  7.45*  --   --   --   --   --   --    < > = values in this interval not displayed.    Estimated Creatinine Clearance: 13.6 mL/min (A) (by C-G formula based on SCr of 7.45 mg/dL (H)).   Medical History: Past Medical History:  Diagnosis Date  . Anemia   . Atherosclerosis   . Chronic kidney disease   . Diabetes mellitus without complication ([place])   . Hypercholesteremia   . Hypertension   . Hypotension   . Stroke [facility])     Medications:  Medications Prior to Admission  Medication Sig Dispense Refill Last Dose  . aspirin EC 81 MG tablet Take 81 mg by mouth daily.   09/03/2018 at am  . b complex-vitamin c-folic acid (NEPHRO-VITE) 0.8 MG TABS tablet Take 1 tablet by mouth daily.   09/03/2018 at am  . cinacalcet (SENSIPAR) 60 MG tablet Take 60 mg by mouth daily.    09/03/2018 at am  . clopidogrel (PLAVIX) 75 MG tablet Take 1 tablet (75 mg total) by mouth daily. 30 tablet 11 09/03/2018 at 0800  . ferrous sulfate 325 (65 FE) MG EC tablet Take 325 mg by mouth 3 (three) times daily with meals.   09/03/2018 at am  . glipiZIDE (GLUCOTROL) 10 MG tablet Take 10 mg by mouth daily.    09/03/2018 at am  . lidocaine-prilocaine (EMLA) cream Apply 1 application  topically as needed (port access).   09/02/2018 at Unknown time  . omeprazole (PRILOSEC) 40 MG capsule Take 40 mg by mouth 2 (two) times daily.    09/03/2018 at am  . polyethylene glycol (MIRALAX / GLYCOLAX) packet Take 17 g by mouth 2 (two) times daily.    09/03/2018 at am  . sevelamer carbonate (RENVELA) 800 MG tablet Take 2,400 mg by mouth 3 (three) times daily with meals.    09/03/2018 at am  . traMADol (ULTRAM) 50 MG tablet Take 1 tablet (50 mg total) by mouth every 6 (six) hours as needed. (Patient taking differently: Take 50 mg by mouth every 6 (six) hours as needed for moderate pain or severe pain. ) 20 tablet 0 unknown at unknown   [employer] a 57 y.o.malewith a history of chronic kidney disease, diabetes as well as peripheral arterial disease was presented emergency department the cold and blue right lower extremity. The patient, several  weeks ago, had a balloon angioplasty and thrombectomy and says that his symptoms are improving. His wife states that he was "growing new skin." Warmth also return to the leg. However, over the past 3 days the leg has started to become blue and cold again. The patient is not complaining any pain at this time. Came to the emergency department for further evaluation and treatment. Per chart review no history of PTA anticoagulation. Hgb trending down (13.2 > 12.1>11.7), will continue to follow  Heparin Course 11/20 Initiation 4900 bolus, then 1600 units/hr 11/21 0146 HL 0.25: inc rate to 1800 units/hr 11/21 0920 HL 0.24:  11/21 drip held due to angio gram 11/21 1854 Drip restarted at previous rate of 2000 units/hr 11/22 0300 HL 0.23 rate inc 2150 unit/hr 11/22 1024 HL 0.23 11/23 0530 HL 0.28 11/23 1324 HL 0.50 11/23 2113 HL 0.45 11/24 @ 0500 HL 0.29  Goal of Therapy:  Heparin level 0.3-0.7 units/ml Monitor platelets by anticoagulation protocol: Yes   Plan:  11/24 @ 1310 HL 0.35 Will continue rate of 2550 units/hr and will recheck HL in  8 hrs.   Chinita Greenland PharmD Clinical Pharmacist 09/07/2018

## 2018-09-07 NOTE — Progress Notes (Signed)
ANTICOAGULATION CONSULT NOTE   Pharmacy Consult for Heparin  Indication: ischemic limb  No Known Allergies  Patient Measurements: Height: 5\' 10"  (177.8 cm) Weight: 249 lb 1.9 oz (113 kg) IBW/kg (Calculated) : 73 Heparin Dosing Weight: 97.8 kg   Vital Signs: Temp: 98.7 F (37.1 C) (11/24 0455) Temp Source: Oral (11/24 0455) BP: 103/48 (11/24 0455) Pulse Rate: 92 (11/24 0455)  Labs: Recent Labs    09/05/18 0256  09/05/18 1202  09/06/18 0520 09/06/18 1324 09/06/18 2113 09/07/18 0609  HGB 11.1*  --   --   --  10.0*  --   --  9.9*  HCT 36.6*  --   --   --  33.6*  --   --  32.4*  PLT 288  --   --   --  254  --   --  259  HEPARINUNFRC 0.23*   < >  --    < > 0.28* 0.50 0.45 0.29*  CREATININE  --   --  7.45*  --   --   --   --   --    < > = values in this interval not displayed.    Estimated Creatinine Clearance: 13.6 mL/min (A) (by C-G formula based on SCr of 7.45 mg/dL (H)).   Medical History: Past Medical History:  Diagnosis Date  . Anemia   . Atherosclerosis   . Chronic kidney disease   . Diabetes mellitus without complication ([place])   . Hypercholesteremia   . Hypertension   . Hypotension   . Stroke [facility])     Medications:  Medications Prior to Admission  Medication Sig Dispense Refill Last Dose  . aspirin EC 81 MG tablet Take 81 mg by mouth daily.   09/03/2018 at am  . b complex-vitamin c-folic acid (NEPHRO-VITE) 0.8 MG TABS tablet Take 1 tablet by mouth daily.   09/03/2018 at am  . cinacalcet (SENSIPAR) 60 MG tablet Take 60 mg by mouth daily.    09/03/2018 at am  . clopidogrel (PLAVIX) 75 MG tablet Take 1 tablet (75 mg total) by mouth daily. 30 tablet 11 09/03/2018 at 0800  . ferrous sulfate 325 (65 FE) MG EC tablet Take 325 mg by mouth 3 (three) times daily with meals.   09/03/2018 at am  . glipiZIDE (GLUCOTROL) 10 MG tablet Take 10 mg by mouth daily.    09/03/2018 at am  . lidocaine-prilocaine (EMLA) cream Apply 1 application topically as needed (port  access).   09/02/2018 at Unknown time  . omeprazole (PRILOSEC) 40 MG capsule Take 40 mg by mouth 2 (two) times daily.    09/03/2018 at am  . polyethylene glycol (MIRALAX / GLYCOLAX) packet Take 17 g by mouth 2 (two) times daily.    09/03/2018 at am  . sevelamer carbonate (RENVELA) 800 MG tablet Take 2,400 mg by mouth 3 (three) times daily with meals.    09/03/2018 at am  . traMADol (ULTRAM) 50 MG tablet Take 1 tablet (50 mg total) by mouth every 6 (six) hours as needed. (Patient taking differently: Take 50 mg by mouth every 6 (six) hours as needed for moderate pain or severe pain. ) 20 tablet 0 unknown at unknown   [employer] a 57 y.o.malewith a history of chronic kidney disease, diabetes as well as peripheral arterial disease was presented emergency department the cold and blue right lower extremity. The patient, several weeks ago, had a balloon angioplasty and thrombectomy and says that his symptoms are improving. His wife  states that he was "growing new skin." Warmth also return to the leg. However, over the past 3 days the leg has started to become blue and cold again. The patient is not complaining any pain at this time. Came to the emergency department for further evaluation and treatment. Per chart review no history of PTA anticoagulation. Hgb trending down (13.2 > 12.1>11.7), will continue to follow  Heparin Course 11/20 Initiation 4900 bolus, then 1600 units/hr 11/21 0146 HL 0.25: inc rate to 1800 units/hr 11/21 0920 HL 0.24:  11/21 drip held due to angio gram 11/21 1854 Drip restarted at previous rate of 2000 units/hr 11/22 0300 HL 0.23 rate inc 2150 unit/hr 11/22 1024 HL 0.23 11/23 0530 HL 0.28 11/23 1324 HL 0.50 11/23 2113 HL 0.45  Goal of Therapy:  Heparin level 0.3-0.7 units/ml Monitor platelets by anticoagulation protocol: Yes   Plan:  11/24 @ 0500 HL 0.29 subtherapeutic. Will increase rate to 2550 units/hr and will recheck HL @ 1300. Will recheck HL @ 1300 CBC  trending down slightly, will continue to monitor.  Tobie Lords, PharmD, BCPS Clinical Pharmacist 09/07/2018

## 2018-09-07 NOTE — Progress Notes (Signed)
[facility]  ROUNDING NOTE   Subjective:   Patient states his pain is controlled.   Amputation scheduled for tomorrow.   Objective:  Vital signs in last 24 hours:  Temp:  [98.7 F (37.1 C)-99.9 F (37.7 C)] 99.9 F (37.7 C) (11/24 1139) Pulse Rate:  [88-98] 98 (11/24 1139) Resp:  [16-20] 16 (11/24 1139) BP: (103-153)/(48-84) 153/84 (11/24 1139) SpO2:  [92 %-95 %] 94 % (11/24 1139)  Weight change:  Filed Weights   09/03/18 1538  Weight: 113 kg    Intake/Output: I/O last 3 completed shifts: In: 1145.6 [P.O.:120; I.V.:975.6; IV Piggyback:50] Out: 0    Intake/Output this shift:  Total I/O In: 85.7 [I.V.:85.7] Out: -   Physical Exam: General: NAD,   Head: Normocephalic, atraumatic. Moist oral mucosal membranes  Eyes: Anicteric, PERRL  Neck: Supple, trachea midline  Lungs:  Clear to auscultation  Heart: Regular rate and rhythm  Abdomen:  Soft, nontender,   Extremities: Right lower extremity with pain and cold to touch, left BKA  Neurologic: Nonfocal, moving all four extremities  Skin: No lesions  Access: AVF    Basic Metabolic Panel: Recent Labs  Lab 09/03/18 1600 09/04/18 0146 09/05/18 1202  NA 136 137 135  K 5.7* 5.3* 5.8*  CL 93* 96* 96*  CO2 28 29 28   GLUCOSE 228* 168* 183*  BUN 25* 35* 45*  CREATININE 5.27* 6.26* 7.45*  CALCIUM 9.1 9.1 8.7*  PHOS  --   --  5.6*    Liver Function Tests: Recent Labs  Lab 09/05/18 1202  ALBUMIN 3.5   No results for input(s): LIPASE, AMYLASE in the last 168 hours. No results for input(s): AMMONIA in the last 168 hours.  CBC: Recent Labs  Lab 09/03/18 1600 09/04/18 0146 09/04/18 0920 09/05/18 0256 09/06/18 0520 09/07/18 0609  WBC 13.4* 11.2* 12.0* 13.6* 12.8* 14.2*  NEUTROABS 10.5*  --   --   --   --   --   HGB 13.2 12.1* 11.7* 11.1* 10.0* 9.9*  HCT 43.0 40.1 38.5* 36.6* 33.6* 32.4*  MCV 97.3 97.8 97.2 97.6 98.2 96.7  PLT 309 277 272 288 254 259    Cardiac Enzymes: No results for  input(s): CKTOTAL, CKMB, CKMBINDEX, TROPONINI in the last 168 hours.  BNP: Invalid input(s): POCBNP  CBG: Recent Labs  Lab 09/06/18 1106 09/06/18 1625 09/06/18 2128 09/07/18 0724 09/07/18 1136  GLUCAP 183* 189* 181* 192* 192*    Microbiology: Results for orders placed or performed during the hospital encounter of 08/12/18  MRSA PCR Screening     Status: None   Collection Time: 08/13/18  9:08 AM  Result Value Ref Range Status   MRSA by PCR NEGATIVE NEGATIVE Final    Comment:        The GeneXpert MRSA Assay (FDA approved for NASAL specimens only), is one component of a comprehensive MRSA colonization surveillance program. It is not intended to diagnose MRSA infection nor to guide or monitor treatment for MRSA infections. Performed at [facility], [address] [place], [place] [address]     Coagulation Studies: No results for input(s): LABPROT, INR in the last 72 hours.  Urinalysis: No results for input(s): COLORURINE, LABSPEC, PHURINE, GLUCOSEU, HGBUR, BILIRUBINUR, KETONESUR, PROTEINUR, UROBILINOGEN, NITRITE, LEUKOCYTESUR in the last 72 hours.  Invalid input(s): APPERANCEUR    Imaging: No results found.   Medications:   . sodium chloride Stopped (09/06/18 2306)  . small volume/piggyback builder 200 mL/hr at 09/06/18 2154  . heparin 2,550 Units/hr (  09/07/18 1120)   . aspirin EC  81 mg Oral Daily  . cinacalcet  60 mg Oral Once per day on Mon Wed Fri  . feeding supplement (NEPRO CARB STEADY)  237 mL Oral BID BM  . ferrous sulfate  325 mg Oral TID WC  . insulin aspart  0-9 Units Subcutaneous TID WC  . midodrine  5 mg Oral TID WC  . multivitamin  1 tablet Oral QHS  . multivitamin-lutein  1 capsule Oral Daily  . pantoprazole  40 mg Oral BID  . polyethylene glycol  17 g Oral BID  . senna-docusate  1 tablet Oral BID  . sevelamer carbonate  2,400 mg Oral TID WC  . sodium zirconium cyclosilicate  10 g Oral Daily  . [START ON 09/09/2018] vitamin C   250 mg Oral BID   sodium chloride, acetaminophen, docusate sodium, HYDROmorphone (DILAUDID) injection **OR** HYDROmorphone (DILAUDID) injection, lidocaine-prilocaine, ondansetron (ZOFRAN) IV, traMADol  Assessment/ Plan:  [name] is a 57 y.o. Hispanic male with end stage renal disease, peripheral vascular disease, hypertension, CVA, hyperlipidemia, diabetes mellitus type II who is admitted to [facility]   MWF [place] 113kg Left AVF  1. End Stage Renal Disease:  MWF schedule. With hyperkalemia Hemodialysis for Monday.  Midodrine before treatment.  - lokelma for hyperkalemia.   2. Anemia of chronic kidney disease: hemoglobin 9.9 - EPO as outpatient. Would hold due to ischemia  3. Hypotension - Patient takes midodrine before his dialysis treatments - home regimen of amlodipine  4. Secondary Hyperparathyroidism:  - sevelamer and calcium acetate with meals.  - cinacalcet on hemodialysis days.   5. Peripheral arterial disease: amputation scheduled for 11/25.  - Appreciate vascular input.  - Heparin gtt.   6. Diabetes mellitus type II with chronic kidney disease: Continue glucose control.    LOS: 4 [name] 11/24/201912:23 PM

## 2018-09-07 NOTE — Progress Notes (Signed)
Subjective: Interval History: has complaints right leg pain.. Above knee amputation was previously recommended. He has now consent to proceeding with surgery tomorrow.  Objective: Vital signs in last 24 hours: Temp:  [98.7 F (37.1 C)-99.6 F (37.6 C)] 98.7 F (37.1 C) (11/24 0455) Pulse Rate:  [88-97] 92 (11/24 0455) Resp:  [18-20] 20 (11/24 0455) BP: (103-129)/(48-72) 103/48 (11/24 0455) SpO2:  [92 %-95 %] 92 % (11/24 0455)  Intake/Output from previous day: 11/23 0701 - 11/24 0700 In: 641.6 [P.O.:120; I.V.:521.6] Out: 0  Intake/Output this shift: Total I/O In: 85.7 [I.V.:85.7] Out: -   General appearance: alert, cooperative and appears stated age Head: Normocephalic, without obvious abnormality, atraumatic Extremities: edema cool and pulseless  Lab Results: Recent Labs    09/06/18 0520 09/07/18 0609  WBC 12.8* 14.2*  HGB 10.0* 9.9*  HCT 33.6* 32.4*  PLT 254 259   BMET Recent Labs    09/05/18 1202  NA 135  K 5.8*  CL 96*  CO2 28  GLUCOSE 183*  BUN 45*  CREATININE 7.45*  CALCIUM 8.7*    Studies/Results: Vas [place] Abi With/wo Tbi  Result Date: 08/29/2018 LOWER EXTREMITY DOPPLER STUDY Indications: Gangrene, peripheral artery disease, and Left BKA; Right all toe              amputation. Right PTA and stent placed SFA 08/13/2018. High Risk Factors: Diabetes.  Comparison Study: none Performing Technologist: Concha Norway RVT  Examination Guidelines: A complete evaluation includes at minimum, Doppler waveform signals and systolic blood pressure reading at the level of bilateral brachial, anterior tibial, and posterior tibial arteries, when vessel segments are accessible. Bilateral testing is considered an integral part of a complete examination. Photoelectric Plethysmograph (PPG) waveforms and toe systolic pressure readings are included as required and additional duplex testing as needed. Limited examinations for reoccurring indications may be performed as noted.  ABI  Findings: +--------+------------------+-----+----------+--------+ Right   Rt Pressure (mmHg)IndexWaveform  Comment  +--------+------------------+-----+----------+--------+ [id number]                                       +--------+------------------+-----+----------+--------+ ATA     187               1.08 monophasic         +--------+------------------+-----+----------+--------+ PTA     182               1.05 monophasic         +--------+------------------+-----+----------+--------+ +--------+------------------+-----+--------+---------------------+ Left    Lt Pressure (mmHg)IndexWaveformComment               +--------+------------------+-----+--------+---------------------+ Brachial                               Left AVF, BP not done +--------+------------------+-----+--------+---------------------+ +-------+-----------+-----------+------------+------------+ ABI/TBIToday's ABIToday's TBIPrevious ABIPrevious TBI +-------+-----------+-----------+------------+------------+ Right  1.07                                           +-------+-----------+-----------+------------+------------+  Summary: Left: Resting left ankle-brachial index is within normal range. No evidence of significant left lower extremity arterial disease. Strong monophasic flow in the PTA, ATA.  *See table(s) above for measurements and observations.  Electronically signed by Leotis Pain MD on 08/29/2018 at 2:30:44 PM.   Final  Vas US Duplex Dialysis Access (avf,avg)  Result Date: 08/29/2018 DIALYSIS ACCESS Access Site: Left Upper Extremity. Access Type: Radial-cephalic AVF. History: End stage renal disease requiring chronic dialysis. LT radceph AVF          created >5 years. Comparison Study: 02/20/2018 Performing Technologist: Concha Norway RVT  Examination Guidelines: A complete evaluation includes B-mode imaging, spectral Doppler, color Doppler, and power Doppler as needed of all accessible  portions of each vessel. Unilateral testing is considered an integral part of a complete examination. Limited examinations for reoccurring indications may be performed as noted.  Findings: +--------------------+----------+-----------------+--------+ AVF                 PSV (cm/s)Flow Vol (mL/min)Comments +--------------------+----------+-----------------+--------+ Native artery inflow   144          1080                +--------------------+----------+-----------------+--------+ AVF Anastomosis        231                              +--------------------+----------+-----------------+--------+  +-------------+----------+-------------+----------+--------+ OUTFLOW VEIN PSV (cm/s)Diameter (cm)Depth (cm)Describe +-------------+----------+-------------+----------+--------+ Axillary vein   150                                    +-------------+----------+-------------+----------+--------+ Confluence      143                                    +-------------+----------+-------------+----------+--------+ Prox UA          81                                    +-------------+----------+-------------+----------+--------+ Mid UA           60                                    +-------------+----------+-------------+----------+--------+ Dist UA          46                                    +-------------+----------+-------------+----------+--------+ AC Fossa         66                                    +-------------+----------+-------------+----------+--------+ Prox Forearm     58                                    +-------------+----------+-------------+----------+--------+ Mid Forearm      44                                    +-------------+----------+-------------+----------+--------+ Dist Forearm     89                                    +-------------+----------+-------------+----------+--------+   +-----------------+-------------+---------+---------+----------+---------------+  Diameter (cm)  Depth  BranchingPSV (cm/s)  Flow Volume                                   (cm)                         (ml/min)     +-----------------+-------------+---------+---------+----------+---------------+ Left radial                                        -75                    distal                                                                    +-----------------+-------------+---------+---------+----------+---------------+ Retrograde                                                                +-----------------+-------------+---------+---------+----------+---------------+  Summary: Patent radial cephalic AVF with no evidence of stenosis. The distal radial artery is retrograde.  *See table(s) above for measurements and observations.  Diagnosing physician: Leotis Pain MD Electronically signed by Leotis Pain MD on 08/29/2018 at 2:30:41 PM.    --------------------------------------------------------------------------------   Final    Anti-infectives: Anti-infectives (From admission, onward)   Start     Dose/Rate Route Frequency Ordered Stop   09/04/18 1400  ceFAZolin (ANCEF) IVPB 1 g/50 mL premix  Status:  Discontinued     1 g 100 mL/hr over 30 Minutes Intravenous  Once 09/04/18 1358 09/04/18 1728   09/04/18 0600  ceFAZolin (ANCEF) IVPB 1 g/50 mL premix    Note to Pharmacy:  Send with pt to OR   1 g 100 mL/hr over 30 Minutes Intravenous On call 09/03/18 1844 09/04/18 1612      Assessment/Plan: s/p Procedure(s): Lower Extremity Angiography (Right) Patient scheduled for surgery tomorrow. Will try to move up if possible based on OR schedule. All of his questions were answered to his satisfaction. He will sign the consent.   LOS: 4 days   Juanna Cao 09/07/2018, 9:19 AM

## 2018-09-07 NOTE — Progress Notes (Signed)
ANTICOAGULATION CONSULT NOTE   Pharmacy Consult for Heparin  Indication: ischemic limb  No Known Allergies  Patient Measurements: Height: 5\' 10"  (177.8 cm) Weight: 249 lb 1.9 oz (113 kg) IBW/kg (Calculated) : 73 Heparin Dosing Weight: 97.8 kg   Vital Signs: Temp: 101 F (38.3 C) (11/24 2018) Temp Source: Oral (11/24 2018) BP: 112/72 (11/24 2018) Pulse Rate: 102 (11/24 2018)  Labs: Recent Labs    09/05/18 0256  09/05/18 1202  09/06/18 0520  09/07/18 0609 09/07/18 1310 09/07/18 2052  HGB 11.1*  --   --   --  10.0*  --  9.9*  --   --   HCT 36.6*  --   --   --  33.6*  --  32.4*  --   --   PLT 288  --   --   --  254  --  259  --   --   HEPARINUNFRC 0.23*   < >  --    < > 0.28*   < > 0.29* 0.35 0.37  CREATININE  --   --  7.45*  --   --   --   --   --   --    < > = values in this interval not displayed.    Estimated Creatinine Clearance: 13.6 mL/min (A) (by C-G formula based on SCr of 7.45 mg/dL (H)).   Medical History: Past Medical History:  Diagnosis Date  . Anemia   . Atherosclerosis   . Chronic kidney disease   . Diabetes mellitus without complication ([place])   . Hypercholesteremia   . Hypertension   . Hypotension   . Stroke [facility])     Medications:  Medications Prior to Admission  Medication Sig Dispense Refill Last Dose  . aspirin EC 81 MG tablet Take 81 mg by mouth daily.   09/03/2018 at am  . b complex-vitamin c-folic acid (NEPHRO-VITE) 0.8 MG TABS tablet Take 1 tablet by mouth daily.   09/03/2018 at am  . cinacalcet (SENSIPAR) 60 MG tablet Take 60 mg by mouth daily.    09/03/2018 at am  . clopidogrel (PLAVIX) 75 MG tablet Take 1 tablet (75 mg total) by mouth daily. 30 tablet 11 09/03/2018 at 0800  . ferrous sulfate 325 (65 FE) MG EC tablet Take 325 mg by mouth 3 (three) times daily with meals.   09/03/2018 at am  . glipiZIDE (GLUCOTROL) 10 MG tablet Take 10 mg by mouth daily.    09/03/2018 at am  . lidocaine-prilocaine (EMLA) cream Apply 1 application  topically as needed (port access).   09/02/2018 at Unknown time  . omeprazole (PRILOSEC) 40 MG capsule Take 40 mg by mouth 2 (two) times daily.    09/03/2018 at am  . polyethylene glycol (MIRALAX / GLYCOLAX) packet Take 17 g by mouth 2 (two) times daily.    09/03/2018 at am  . sevelamer carbonate (RENVELA) 800 MG tablet Take 2,400 mg by mouth 3 (three) times daily with meals.    09/03/2018 at am  . traMADol (ULTRAM) 50 MG tablet Take 1 tablet (50 mg total) by mouth every 6 (six) hours as needed. (Patient taking differently: Take 50 mg by mouth every 6 (six) hours as needed for moderate pain or severe pain. ) 20 tablet 0 unknown at unknown   [employer] a 57 y.o.malewith a history of chronic kidney disease, diabetes as well as peripheral arterial disease was presented emergency department the cold and blue right lower extremity. The patient, several  weeks ago, had a balloon angioplasty and thrombectomy and says that his symptoms are improving. His wife states that he was "growing new skin." Warmth also return to the leg. However, over the past 3 days the leg has started to become blue and cold again. The patient is not complaining any pain at this time. Came to the emergency department for further evaluation and treatment. Per chart review no history of PTA anticoagulation. Hgb trending down (13.2 > 12.1>11.7), will continue to follow  Heparin Course 11/20 Initiation 4900 bolus, then 1600 units/hr 11/21 0146 HL 0.25: inc rate to 1800 units/hr 11/21 0920 HL 0.24:  11/21 drip held due to angio gram 11/21 1854 Drip restarted at previous rate of 2000 units/hr 11/22 0300 HL 0.23 rate inc 2150 unit/hr 11/22 1024 HL 0.23 11/23 0530 HL 0.28 11/23 1324 HL 0.50 11/23 2113 HL 0.45 11/24 @ 0500 HL 0.29  Goal of Therapy:  Heparin level 0.3-0.7 units/ml Monitor platelets by anticoagulation protocol: Yes   Plan:  11/24 @ 2052 HL 0.37 Will continue rate of 2550 units/hr and will recheck HL  daily  Evelena Asa, PharmD Clinical Pharmacist 09/07/2018

## 2018-09-07 NOTE — Progress Notes (Signed)
Spoke with patient via Education administrator # [phone]. Discussed medications, assessment, plan of care, how and when to call for pain meds.

## 2018-09-07 NOTE — Progress Notes (Addendum)
[place] [facility] [place]: [name]    MR#:  [id number]  DATE OF BIRTH:  [birth year]  SUBJECTIVE:  CHIEF COMPLAINT:  No chief complaint on file.   Came with Right foot pain, cold, gangrenous.  on heparin drip, status post angioplasty Pain is not  well controlled with tramadol will change to oxycodone.  No bowel movement for the past 3 days Agreeable for above-knee amputation on Monday   REVIEW OF SYSTEMS:  CONSTITUTIONAL: No fever, fatigue or weakness.  EYES: No blurred or double vision.  EARS, NOSE, AND THROAT: No tinnitus or ear pain.  RESPIRATORY: No cough, shortness of breath, wheezing or hemoptysis.  CARDIOVASCULAR: No chest pain, orthopnea, edema.  GASTROINTESTINAL: No nausea, vomiting, diarrhea or abdominal pain.  GENITOURINARY: No dysuria, hematuria.  ENDOCRINE: No polyuria, nocturia,  HEMATOLOGY: No anemia, easy bruising or bleeding SKIN: No rash or lesion. MUSCULOSKELETAL: No joint pain or arthritis.   NEUROLOGIC: No tingling, numbness, weakness.  PSYCHIATRY: No anxiety or depression.   ROS  DRUG ALLERGIES:  No Known Allergies  VITALS:  Blood pressure (!) 153/84, pulse 98, temperature 99.9 F (37.7 C), temperature source Oral, resp. rate 16, height 5\' 10"  (1.778 m), weight 113 kg, SpO2 94 %.  PHYSICAL EXAMINATION:   GENERAL:  57 y.o.-year-old patient lying in the bed with no acute distress.  EYES: Pupils equal, round, reactive to light and accommodation. No scleral icterus. Extraocular muscles intact.  HEENT: Head atraumatic, normocephalic. Oropharynx and nasopharynx clear.  NECK:  Supple, no jugular venous distention. No thyroid enlargement, no tenderness.  LUNGS: Normal breath sounds bilaterally, no wheezing, rales,rhonchi or crepitation. No use of accessory muscles of respiration.  CARDIOVASCULAR: S1, S2 normal. No murmurs, rubs, or gallops.  ABDOMEN: Soft, nontender, nondistended. Bowel sounds  present. EXTREMITIES: No pedal edema, cold right leg and foot.  Left side status post below-knee amputation and right side transmetatarsal amputation. NEUROLOGIC: .  Awake, alert and oriented x3 gait not checked.  PSYCHIATRIC: The patient is alert and oriented x 3.  SKIN: No obvious rash, lesion, or ulcer.   Physical Exam LABORATORY PANEL:   CBC Recent Labs  Lab 09/07/18 0609  WBC 14.2*  HGB 9.9*  HCT 32.4*  PLT 259   ------------------------------------------------------------------------------------------------------------------  Chemistries  Recent Labs  Lab 09/05/18 1202  NA 135  K 5.8*  CL 96*  CO2 28  GLUCOSE 183*  BUN 45*  CREATININE 7.45*  CALCIUM 8.7*   ------------------------------------------------------------------------------------------------------------------  Cardiac Enzymes No results for input(s): TROPONINI in the last 168 hours. ------------------------------------------------------------------------------------------------------------------  RADIOLOGY:  No results found.  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 Status post angiogram and vascular is recommending right leg AKA on Monday.  Patient and his wife are agreeable Currently IV heparin drip. Continue aspirin, hold Plavix for now. Discontinue tramadol and add oxycodone to the pain management regimen N.p.o. after midnight  *Diabetes mellitus with complications of atherosclerosis and peripheral arteries I will hold oral meds and keep on sliding scale coverage.  *End-stage renal disease on hemodialysis Nephrologist to help with continuing dialysis in the hospital. Continue supplemental medications as per nephrologist.  *Hyperkalemia Patient is a  dialysis patient and nephrology is following Check a.m. Labs  *Constipation MiraLAX, Colace and senna regimen  Dulcolax suppository as needed  Daliresp is added for narcotic induced constipation  All the records are reviewed and case discussed with Care Management/Social Workerr. Management plans discussed with the patient, family with the help of Spanish interpreter [place] and they are in agreement.  CODE STATUS: Full.  TOTAL TIME TAKING CARE OF THIS PATIENT: 35 minutes.     POSSIBLE D/C IN ? DAYS, DEPENDING ON CLINICAL CONDITION.   [name] M.D on 09/07/2018   Between 7am to 6pm - Pager - [phone]   After 6pm go to [email or web address] - password [email or web address] [facility]  Office  [phone]  CC: Primary care physician; [name], MD  Note: This dictation was prepared with Dragon dictation along with smaller phrase technology. Any transcriptional errors that result from this process are unintentional.

## 2018-09-07 NOTE — Anesthesia Preprocedure Evaluation (Addendum)
Anesthesia Evaluation  Patient identified by MRN, date of birth, ID band Patient awake    Reviewed: Allergy & Precautions, NPO status , Patient's Chart, lab work & pertinent test results, reviewed documented beta blocker date and time   History of Anesthesia Complications Negative for: history of anesthetic complications  Airway Mallampati: III  TM Distance: >3 FB Neck ROM: Full    Dental  (+) Missing, Poor Dentition   Pulmonary shortness of breath, with exertion and at rest,  Dry cough for 3 months   breath sounds clear to auscultation (-) decreased breath sounds      Cardiovascular Exercise Tolerance: Poor hypertension, Pt. on medications (-) angina+ Peripheral Vascular Disease, +CHF, + PND and + DOE  (-) Past MI  Rhythm:Regular Rate:Normal     Neuro/Psych  Neuromuscular disease CVA (2008, right sided weakness), Residual Symptoms negative psych ROS   GI/Hepatic negative GI ROS, Neg liver ROS, neg GERD  ,  Endo/Other  diabetes, Type 2, Oral Hypoglycemic AgentsMorbid obesity  Renal/GU Dialysis and ESRFRenal disease (10 years on dialysis)     Musculoskeletal Some back pain   Abdominal (+) + obese,   Peds  Hematology  (+) anemia ,   Anesthesia Other Findings Past Medical History:   Chronic kidney disease                                       Hypertension                                                 Diabetes mellitus without complication (HCC)                 Hypercholesteremia                                           Atherosclerosis                                              Stroke ([place])                                                 Anemia                                                     Last dose of Plavix was 11/20  Reproductive/Obstetrics                          Anesthesia Physical  Anesthesia Plan  ASA: III  Anesthesia Plan: General   Post-op Pain Management:     Induction: Intravenous  PONV Risk Score and Plan:   Airway Management Planned: Oral ETT and LMA  Additional Equipment:   Intra-op Plan:   Post-operative Plan:  Informed Consent: I have reviewed the patients History and Physical, chart, labs and discussed the procedure including the risks, benefits and alternatives for the proposed anesthesia with the patient or authorized representative who has indicated his/her understanding and acceptance.     Plan Discussed with: CRNA, Anesthesiologist and Surgeon  Anesthesia Plan Comments:        Anesthesia Quick Evaluation

## 2018-09-08 ENCOUNTER — Encounter
Admission: EM | Disposition: A | Discharge status: Skilled Nursing Facility | Payer: Self-pay | Source: Home / Self Care | Attending: Internal Medicine

## 2018-09-08 LAB — GLUCOSE, CAPILLARY
GLUCOSE-CAPILLARY: 218 mg/dL — AB (ref 70–99)
Glucose-Capillary: 202 mg/dL — ABNORMAL HIGH (ref 70–99)
Glucose-Capillary: 143 mg/dL — ABNORMAL HIGH (ref 70–99)
Glucose-Capillary: 174 mg/dL — ABNORMAL HIGH (ref 70–99)

## 2018-09-08 LAB — CBC
HEMATOCRIT: 31.6 % — AB (ref 39.0–52.0)
HEMOGLOBIN: 9.7 g/dL — AB (ref 13.0–17.0)
MCH: 29.7 pg (ref 26.0–34.0)
MCHC: 30.7 g/dL (ref 30.0–36.0)
MCV: 96.6 fL (ref 80.0–100.0)
NRBC: 0.1 % (ref 0.0–0.2)
Platelets: 264 10*3/uL (ref 150–400)
RBC: 3.27 MIL/uL — AB (ref 4.22–5.81)
RDW: 16 % — ABNORMAL HIGH (ref 11.5–15.5)
WBC: 21.5 10*3/uL — ABNORMAL HIGH (ref 4.0–10.5)

## 2018-09-08 LAB — BASIC METABOLIC PANEL
ANION GAP: 16 — AB (ref 5–15)
BUN: 56 mg/dL — ABNORMAL HIGH (ref 6–20)
CO2: 26 mmol/L (ref 22–32)
Calcium: 8.9 mg/dL (ref 8.9–10.3)
Chloride: 91 mmol/L — ABNORMAL LOW (ref 98–111)
Creatinine, Ser: 9.61 mg/dL — ABNORMAL HIGH (ref 0.61–1.24)
GFR calc Af Amer: 6 mL/min — ABNORMAL LOW (ref >60)
GFR, EST NON AFRICAN AMERICAN: 5 mL/min — AB (ref >60)
GLUCOSE: 199 mg/dL — AB (ref 70–99)
POTASSIUM: 6 mmol/L — AB (ref 3.5–5.1)
Sodium: 133 mmol/L — ABNORMAL LOW (ref 135–145)

## 2018-09-08 LAB — TYPE AND SCREEN
ABO/RH(D): O POS
Antibody Screen: NEGATIVE

## 2018-09-08 LAB — PROTIME-INR
INR: 1.22
Prothrombin Time: 15.3 seconds — ABNORMAL HIGH (ref 11.4–15.2)

## 2018-09-08 LAB — MAGNESIUM: Magnesium: 2.5 mg/dL — ABNORMAL HIGH (ref 1.7–2.4)

## 2018-09-08 LAB — POTASSIUM: POTASSIUM: 4.9 mmol/L (ref 3.5–5.1)

## 2018-09-08 LAB — HEPARIN LEVEL (UNFRACTIONATED): HEPARIN UNFRACTIONATED: 0.42 [IU]/mL (ref 0.30–0.70)

## 2018-09-08 LAB — PROCALCITONIN: Procalcitonin: 5.86 ng/mL

## 2018-09-08 SURGERY — AMPUTATION, ABOVE KNEE
Anesthesia: Choice | Laterality: Right

## 2018-09-08 MED ORDER — PIPERACILLIN-TAZOBACTAM 3.375 G IVPB
3.3750 g | Freq: Two times a day (BID) | INTRAVENOUS | Status: DC
  Administered 2018-09-08 – 2018-09-12 (×8): 3.375 g via INTRAVENOUS
  Filled 2018-09-08 (×11): qty 50

## 2018-09-08 MED ORDER — PIPERACILLIN-TAZOBACTAM 3.375 G IVPB
3.3750 g | Freq: Two times a day (BID) | INTRAVENOUS | Status: DC
  Filled 2018-09-08 (×3): qty 50

## 2018-09-08 MED ORDER — MIDODRINE HCL 5 MG PO TABS
10.0000 mg | ORAL_TABLET | Freq: Once | ORAL | Status: AC
  Administered 2018-09-08: 10 mg via ORAL
  Filled 2018-09-08: qty 2

## 2018-09-08 NOTE — Progress Notes (Signed)
[facility]  Daily Progress Note   Communication Note The patient had a potassium of 6.0 this AM requiring dialysis. Surgery following dialysis is not recommended by anesthesia. We will not be able to move forward with AKA today. Plan will be for AKA on Wed with Dr. Lucky Cowboy.  Marcelle Overlie PA-C 09/08/2018 1:01 PM

## 2018-09-08 NOTE — Progress Notes (Signed)
HD Treatment Initiated    09/08/18 0952  Vital Signs  Pulse Rate (!) 103  Resp 13  During Hemodialysis Assessment  Blood Flow Rate (mL/min) 400 mL/min  Arterial Pressure (mmHg) -180 mmHg  Venous Pressure (mmHg) 160 mmHg  Transmembrane Pressure (mmHg) 50 mmHg  Ultrafiltration Rate (mL/min) 1000 mL/min  Dialysate Flow Rate (mL/min) 600 ml/min  Conductivity: Machine  14.1  HD Safety Checks Performed Yes  Dialysis Fluid Bolus Normal Saline  Bolus Amount (mL) 250 mL  Intra-Hemodialysis Comments Tx initiated;Progressing as prescribed  Fistula / Graft Left Forearm Arteriovenous fistula  No Placement Date or Time found.   Placed prior to admission: Yes  Orientation: Left  Access Location: Forearm  Access Type: Arteriovenous fistula  Status Accessed  Needle Size 15

## 2018-09-08 NOTE — Progress Notes (Signed)
Inpatient Diabetes Program Recommendations  AACE/ADA: New Consensus Statement on Inpatient Glycemic Control (2019)  Target Ranges:  Prepandial:   less than 140 mg/dL      Peak postprandial:   less than 180 mg/dL (1-2 hours)      Critically ill patients:  140 - 180 mg/dL   Results for STCLAIR, SZYMBORSKI (MRN [id number]) as of 09/08/2018 11:19  Ref. Range 09/07/2018 07:24 09/07/2018 11:36 09/07/2018 17:00 09/07/2018 21:08 09/08/2018 07:36  Glucose-Capillary Latest Ref Range: 70 - 99 mg/dL 192 (H) 192 (H) 197 (H) 195 (H) 202 (H)   Review of Glycemic Control  Diabetes history: DM2 Outpatient Diabetes medications: Glipizide 10 mg daily Current orders for Inpatient glycemic control: Novolog 0-9 units TID with meals  Inpatient Diabetes Program Recommendations:   Insulin - Meal Coverage: Once diet is resumed, please consider ordering Novolog 3 units TID with meals for meal coverage if patient eats at least 50% of meals.  Thanks, Barnie Alderman, RN, MSN, CDE Diabetes Coordinator Inpatient Diabetes Program [phone] (Team Pager from 8am to 5pm)

## 2018-09-08 NOTE — Progress Notes (Signed)
HD Treatment Complete  Patient's system clotted 11 minutes prior to treatment ending. Rinsed back arterial side, venous chamber clotted. MD aware.     09/08/18 1322  Vital Signs  Pulse Rate (!) 105  Pulse Rate Source Monitor  Resp (!) 26  During Hemodialysis Assessment  Blood Flow Rate (mL/min) 400 mL/min  Arterial Pressure (mmHg) -220 mmHg  Venous Pressure (mmHg) 220 mmHg  Transmembrane Pressure (mmHg) 20 mmHg  Ultrafiltration Rate (mL/min) 1000 mL/min  Dialysate Flow Rate (mL/min) 600 ml/min  Conductivity: Machine  14.1  HD Safety Checks Performed Yes  Intra-Hemodialysis Comments See progress note;Tx completed

## 2018-09-08 NOTE — Progress Notes (Addendum)
Had a long discussion with patient as he returned from dialysis.  K was just drawn and is pending.  If anesthesia feels safe and OR time available this afternoon, will proceed with AKA.  If anesthesia does not feel comfortable this soon after dialysis will have to postpone surgery and likely next available time would be Wednesday.  If this is the case, should get HD tomorrow to avoid any conflict Wednesday. Patient frustrated but I explained that much of this is out of my control.  The interview was done with an interpretor and the RN was present as well.

## 2018-09-08 NOTE — Progress Notes (Signed)
Per MD okay for RN to order stat potassium after dialysis.

## 2018-09-08 NOTE — Progress Notes (Signed)
[facility]  ROUNDING NOTE   Subjective:   Patient underwent urgent HD today for hyperkalemia Denies any acute c/o  Objective:  Vital signs in last 24 hours:  Temp:  [99.4 F (37.4 C)-101 F (38.3 C)] 99.4 F (37.4 C) (11/25 1356) Pulse Rate:  [92-113] 102 (11/25 1356) Resp:  [11-26] 19 (11/25 1356) BP: (79-148)/(50-87) 105/52 (11/25 1356) SpO2:  [90 %-100 %] 100 % (11/25 1356) Weight:  [113.3 kg-116.9 kg] 113.3 kg (11/25 1330)  Weight change:  Filed Weights   09/03/18 1538 09/08/18 0934 09/08/18 1330  Weight: 113 kg 116.9 kg 113.3 kg    Intake/Output: I/O last 3 completed shifts: In: 3710 [P.O.:120; I.V.:1185] Out: 0    Intake/Output this shift:  Total I/O In: 45.8 [I.V.:45.8] Out: 2942 [Other:2942]  Physical Exam: General: NAD,   Head: Normocephalic, atraumatic. Moist oral mucosal membranes  Eyes: Anicteric,   Neck: Supple,   Lungs:  Clear to auscultation  Heart: Regular rate and rhythm  Abdomen:  Soft, nontender,   Extremities: Right lower extremity with pain and cold to touch, left BKA  Neurologic: Nonfocal, moving all four extremities  Skin: No lesions  Access: AVF    Basic Metabolic Panel: Recent Labs  Lab 09/03/18 1600 09/04/18 0146 09/05/18 1202 09/08/18 0433 09/08/18 1405  NA 136 137 135 133*  --   K 5.7* 5.3* 5.8* 6.0* 4.9  CL 93* 96* 96* 91*  --   CO2 28 29 28 26   --   GLUCOSE 228* 168* 183* 199*  --   BUN 25* 35* 45* 56*  --   CREATININE 5.27* 6.26* 7.45* 9.61*  --   CALCIUM 9.1 9.1 8.7* 8.9  --   MG  --   --   --  2.5*  --   PHOS  --   --  5.6*  --   --     Liver Function Tests: Recent Labs  Lab 09/05/18 1202  ALBUMIN 3.5   No results for input(s): LIPASE, AMYLASE in the last 168 hours. No results for input(s): AMMONIA in the last 168 hours.  CBC: Recent Labs  Lab 09/03/18 1600  09/04/18 0920 09/05/18 0256 09/06/18 0520 09/07/18 0609 09/08/18 0433  WBC 13.4*   < > 12.0* 13.6* 12.8* 14.2* 21.5*   NEUTROABS 10.5*  --   --   --   --   --   --   HGB 13.2   < > 11.7* 11.1* 10.0* 9.9* 9.7*  HCT 43.0   < > 38.5* 36.6* 33.6* 32.4* 31.6*  MCV 97.3   < > 97.2 97.6 98.2 96.7 96.6  PLT 309   < > 272 288 254 259 264   < > = values in this interval not displayed.    Cardiac Enzymes: No results for input(s): CKTOTAL, CKMB, CKMBINDEX, TROPONINI in the last 168 hours.  BNP: Invalid input(s): POCBNP  CBG: Recent Labs  Lab 09/07/18 1136 09/07/18 1700 09/07/18 2108 09/08/18 0736 09/08/18 1145  GLUCAP 192* 197* 195* 202* 143*    Microbiology: Results for orders placed or performed during the hospital encounter of 08/12/18  MRSA PCR Screening     Status: None   Collection Time: 08/13/18  9:08 AM  Result Value Ref Range Status   MRSA by PCR NEGATIVE NEGATIVE Final    Comment:        The GeneXpert MRSA Assay (FDA approved for NASAL specimens only), is one component of a comprehensive MRSA colonization surveillance program. It is not  intended to diagnose MRSA infection nor to guide or monitor treatment for MRSA infections. Performed at [facility], [address] [place], [place] [address]     Coagulation Studies: Recent Labs    09/08/18 0433  LABPROT 15.3*  INR 1.22    Urinalysis: No results for input(s): COLORURINE, LABSPEC, PHURINE, GLUCOSEU, HGBUR, BILIRUBINUR, KETONESUR, PROTEINUR, UROBILINOGEN, NITRITE, LEUKOCYTESUR in the last 72 hours.  Invalid input(s): APPERANCEUR    Imaging: No results found.   Medications:   . sodium chloride Stopped (09/06/18 2306)  . small volume/piggyback builder 200 mL/hr at 09/08/18 0901  . piperacillin-tazobactam (ZOSYN)  IV 3.375 g (09/08/18 1358)   . aspirin EC  81 mg Oral Daily  . cinacalcet  60 mg Oral Once per day on Mon Wed Fri  . feeding supplement (NEPRO CARB STEADY)  237 mL Oral BID BM  . ferrous sulfate  325 mg Oral TID WC  . insulin aspart  0-9 Units Subcutaneous TID WC  . midodrine  5 mg Oral TID WC   . multivitamin  1 tablet Oral QHS  . multivitamin-lutein  1 capsule Oral Daily  . pantoprazole  40 mg Oral BID  . polyethylene glycol  17 g Oral BID  . roflumilast  500 mcg Oral Daily  . senna-docusate  1 tablet Oral BID  . sevelamer carbonate  2,400 mg Oral TID WC  . sodium zirconium cyclosilicate  10 g Oral Daily  . [START ON 09/09/2018] vitamin C  250 mg Oral BID   sodium chloride, acetaminophen, docusate sodium, HYDROmorphone (DILAUDID) injection **OR** HYDROmorphone (DILAUDID) injection, lidocaine-prilocaine, ondansetron (ZOFRAN) IV, oxyCODONE  Assessment/ Plan:  [name] is a 57 y.o. Hispanic male with end stage renal disease, peripheral vascular disease, hypertension, CVA, hyperlipidemia, diabetes mellitus type II who is admitted to [facility] 113 kg Left AVF  1. End Stage Renal Disease:  MWF schedule. With hyperkalemia Next HD tuesday Midodrine before treatment.    2. Anemia of chronic kidney disease: hemoglobin 9.7 - low dose EPO to be scheduled with HD  3. Hypotension - Patient takes midodrine before his dialysis treatments  4. Secondary Hyperparathyroidism:  - sevelamer and cinacalcet with meals.   5. Peripheral arterial disease: amputation scheduled for 11/27  - Appreciate vascular input.  - Heparin gtt.   6. Diabetes mellitus type II with chronic kidney disease: Continue glucose control.    LOS: 5 Jehu Mccauslin 11/25/20193:24 PM

## 2018-09-08 NOTE — Progress Notes (Signed)
Pharmacy Antibiotic Note  [name] is a 57 y.o. male admitted on 09/03/2018 with wound infection.  Pharmacy has been consulted for Zosyn dosing.  Plan: Zosyn 3.375 g IV q12h  EI  Height: 5\' 10"  (177.8 cm) Weight: (P) 257 lb 11.5 oz (116.9 kg) IBW/kg (Calculated) : 73  Temp (24hrs), Avg:99.9 F (37.7 C), Min:99.3 F (37.4 C), Max:101 F (38.3 C)  Recent Labs  Lab 09/03/18 1600 09/03/18 1607 09/04/18 0146 09/04/18 0920 09/05/18 0256 09/05/18 1202 09/06/18 0520 09/07/18 0609 09/08/18 0433  WBC 13.4*  --  11.2* 12.0* 13.6*  --  12.8* 14.2* 21.5*  CREATININE 5.27*  --  6.26*  --   --  7.45*  --   --  9.61*  LATICACIDVEN  --  1.70  --   --   --   --   --   --   --     Estimated Creatinine Clearance: 10.5 mL/min (A) (by C-G formula based on SCr of 9.61 mg/dL (H)).    No Known Allergies  Antimicrobials this admission: Zosyn 11/25 >>  Dose adjustments this admission:   Microbiology results: none  Thank you for allowing pharmacy to be a part of this patient's care.  Rocky Morel 09/08/2018 11:23 AM

## 2018-09-08 NOTE — Progress Notes (Signed)
ANTICOAGULATION CONSULT NOTE   Pharmacy Consult for Heparin  Indication: ischemic limb  No Known Allergies  Patient Measurements: Height: 5\' 10"  (177.8 cm) Weight: 249 lb 1.9 oz (113 kg) IBW/kg (Calculated) : 73 Heparin Dosing Weight: 97.8 kg   Vital Signs: Temp: 99.9 F (37.7 C) (11/25 0543) Temp Source: Oral (11/25 0543) BP: 137/82 (11/25 0543) Pulse Rate: 113 (11/25 0543)  Labs: Recent Labs    09/05/18 1202  09/06/18 0520  09/07/18 0609 09/07/18 1310 09/07/18 2052 09/08/18 0433  HGB  --    < > 10.0*  --  9.9*  --   --  9.7*  HCT  --   --  33.6*  --  32.4*  --   --  31.6*  PLT  --   --  254  --  259  --   --  264  LABPROT  --   --   --   --   --   --   --  15.3*  INR  --   --   --   --   --   --   --  1.22  HEPARINUNFRC  --    < > 0.28*   < > 0.29* 0.35 0.37 0.42  CREATININE 7.45*  --   --   --   --   --   --  9.61*   < > = values in this interval not displayed.    Estimated Creatinine Clearance: 10.5 mL/min (A) (by C-G formula based on SCr of 9.61 mg/dL (H)).   Medical History: Past Medical History:  Diagnosis Date  . Anemia   . Atherosclerosis   . Chronic kidney disease   . Diabetes mellitus without complication ([place])   . Hypercholesteremia   . Hypertension   . Hypotension   . Stroke [facility])     Medications:  Medications Prior to Admission  Medication Sig Dispense Refill Last Dose  . aspirin EC 81 MG tablet Take 81 mg by mouth daily.   09/03/2018 at am  . b complex-vitamin c-folic acid (NEPHRO-VITE) 0.8 MG TABS tablet Take 1 tablet by mouth daily.   09/03/2018 at am  . cinacalcet (SENSIPAR) 60 MG tablet Take 60 mg by mouth daily.    09/03/2018 at am  . clopidogrel (PLAVIX) 75 MG tablet Take 1 tablet (75 mg total) by mouth daily. 30 tablet 11 09/03/2018 at 0800  . ferrous sulfate 325 (65 FE) MG EC tablet Take 325 mg by mouth 3 (three) times daily with meals.   09/03/2018 at am  . glipiZIDE (GLUCOTROL) 10 MG tablet Take 10 mg by mouth daily.     09/03/2018 at am  . lidocaine-prilocaine (EMLA) cream Apply 1 application topically as needed (port access).   09/02/2018 at Unknown time  . omeprazole (PRILOSEC) 40 MG capsule Take 40 mg by mouth 2 (two) times daily.    09/03/2018 at am  . polyethylene glycol (MIRALAX / GLYCOLAX) packet Take 17 g by mouth 2 (two) times daily.    09/03/2018 at am  . sevelamer carbonate (RENVELA) 800 MG tablet Take 2,400 mg by mouth 3 (three) times daily with meals.    09/03/2018 at am  . traMADol (ULTRAM) 50 MG tablet Take 1 tablet (50 mg total) by mouth every 6 (six) hours as needed. (Patient taking differently: Take 50 mg by mouth every 6 (six) hours as needed for moderate pain or severe pain. ) 20 tablet 0 unknown at unknown   [employer]  a 57 y.o.malewith a history of chronic kidney disease, diabetes as well as peripheral arterial disease was presented emergency department the cold and blue right lower extremity. The patient, several weeks ago, had a balloon angioplasty and thrombectomy and says that his symptoms are improving. His wife states that he was "growing new skin." Warmth also return to the leg. However, over the past 3 days the leg has started to become blue and cold again. The patient is not complaining any pain at this time. Came to the emergency department for further evaluation and treatment. Per chart review no history of PTA anticoagulation. Hgb trending down (13.2 > 12.1>11.7), will continue to follow  Heparin Course 11/20 Initiation 4900 bolus, then 1600 units/hr 11/21 0146 HL 0.25: inc rate to 1800 units/hr 11/21 0920 HL 0.24:  11/21 drip held due to angio gram 11/21 1854 Drip restarted at previous rate of 2000 units/hr 11/22 0300 HL 0.23 rate inc 2150 unit/hr 11/22 1024 HL 0.23 11/23 0530 HL 0.28 11/23 1324 HL 0.50 11/23 2113 HL 0.45 11/24 @ 0500 HL 0.29  Goal of Therapy:  Heparin level 0.3-0.7 units/ml Monitor platelets by anticoagulation protocol: Yes   Plan:   11/25 @ 0500 HL 0.47 therapeutic. Will continue current rate and will recheck HL w/ am labs. Hgb trending down will continue to monitor.  Tobie Lords, PharmD, BCPS Clinical Pharmacist 09/08/2018

## 2018-09-08 NOTE — Progress Notes (Addendum)
Per Dr. Lasandra Beech for RN to place renal diet for pt. Also NPO after midnight. Dr. Lucky Cowboy will arrange his schedule to do surgery tomorrow. Also per MD we wont restart heparin. Will continue to assess and monitor pt.

## 2018-09-08 NOTE — Progress Notes (Signed)
Palliative care consult received and chart reviewed. After discussion with Dr. Vianne Bulls, no need to see patient at this time. Please call if needs arise.  Thank you for this consult.  Juel Burrow, DNP, AGNP-C Palliative Medicine Team Team Phone # [phone]  Pager # [phone]

## 2018-09-08 NOTE — Progress Notes (Signed)
Pre HD Treatment    09/08/18 0934  Vital Signs  Temp 99.6 F (37.6 C)  Temp Source Oral  Pulse Rate (!) 104  Pulse Rate Source Monitor  Resp 15  BP 138/72  BP Location Right Arm  BP Method Automatic  Patient Position (if appropriate) Lying  Oxygen Therapy  SpO2 94 %  O2 Device Room Air  Pain Assessment  Pain Scale 0-10  Pain Score 3  Pain Type Acute pain  Pain Location Leg  Pain Orientation Right  Pain Radiating Towards Lower Leg  Pain Descriptors / Indicators Aching  Pain Frequency Constant  Pain Onset On-going  Patients Stated Pain Goal 0  Pain Intervention(s) RN made aware  POSS Scale ([place] Opioid Sedation Scale)  POSS *See Group Information* 1-Acceptable,Awake and alert  Dialysis Weight  Weight 116.9 kg  Type of Weight Pre-Dialysis  Time-Out for Hemodialysis  What Procedure? HD  Pt Identifiers(min of two) First/Last Name;MRN/Account#;Pt's DOB(use if MRN/Acct# not available  Correct Site? Yes  Correct Side? Yes  Correct Procedure? Yes  Consents Verified? Yes  Rad Studies Available? N/A  Safety Precautions Reviewed? Yes  Engineer, civil (consulting) Number 2  Station Number 4  UF/Alarm Test Passed  Conductivity: Meter 14  Conductivity: Machine  14.1  pH 7.4  Reverse Osmosis Main  Normal Saline Lot Number [id number]  Dialyzer Lot Number [id number]  Disposable Set Lot Number [id number]  Machine Temperature 98.6 F (37 C)  Musician and Audible Yes  Blood Lines Intact and Secured Yes  Pre Treatment Patient Checks  Vascular access used during treatment Fistula  Hepatitis B Surface Antigen Results Negative  Date Hepatitis B Surface Antigen Drawn 08/13/18  Hepatitis B Surface Antibody  (>10)  Date Hepatitis B Surface Antibody Drawn 08/13/18  Hemodialysis Consent Verified Yes  Hemodialysis Standing Orders Initiated Yes  ECG (Telemetry) Monitor On Yes  Prime Ordered Normal Saline  Length of  DialysisTreatment -hour(s) 3.5 Hour(s)  Dialyzer Elisio 17H NR   Dialysate 2K, 2.5 Ca  Dialysis Anticoagulant None  Dialysate Flow Ordered 600  Blood Flow Rate Ordered 400 mL/min  Ultrafiltration Goal 3 Liters  Pre Treatment Labs Type & Screen;Type & Cross  Dialysis Blood Pressure Support Ordered Normal Saline  Education / Care Plan  Dialysis Education Provided Yes  Documented Education in Care Plan Yes  Fistula / Graft Left Forearm Arteriovenous fistula  No Placement Date or Time found.   Placed prior to admission: Yes  Orientation: Left  Access Location: Forearm  Access Type: Arteriovenous fistula  Site Condition No complications  Fistula / Graft Assessment Present;Thrill;Bruit  Drainage Description None

## 2018-09-08 NOTE — Progress Notes (Signed)
Per Dr. Lucky Cowboy okay for RN to stop heparin drip. Pt will not be on it after surgery.

## 2018-09-08 NOTE — Progress Notes (Addendum)
Post HD Treatment  Pt alert and oriented x4. Patient's net UF was 2942 and his BVP was 72.9. Report called to [place], Primary RN.    09/08/18 1330  Hand-Off documentation  Report given to (Full Name) Lum Babe  Report received from (Full Name) Stephannie Peters, RN  Vital Signs  Temp 99.5 F (37.5 C)  Temp Source Oral  Pulse Rate (!) 102  Pulse Rate Source Monitor  Resp 18  BP 125/70  BP Location Right Arm  BP Method Automatic  Patient Position (if appropriate) Lying  Oxygen Therapy  SpO2 100 %  O2 Device Room Air  Pain Assessment  Pain Scale 0-10  Pain Score 10  Pain Type Acute pain  Pain Location Leg  Pain Orientation Right  Pain Radiating Towards Lower Leg  Pain Descriptors / Indicators Aching  Pain Frequency Constant  Pain Onset On-going  Patients Stated Pain Goal 0  Pain Intervention(s) RN made aware  Multiple Pain Sites No  Dialysis Weight  Weight 113.3 kg  Type of Weight Post-Dialysis  Post-Hemodialysis Assessment  Rinseback Volume (mL) 250 mL  Dialyzer Clearance Clotted  Duration of HD Treatment -hour(s) 3.25 hour(s)  Hemodialysis Intake (mL) 375 mL  UF Total -Machine (mL) 3317 mL  Net UF (mL) 2942 mL  Tolerated HD Treatment Yes  AVG/AVF Arterial Site Held (minutes) 10 minutes  AVG/AVF Venous Site Held (minutes) 10 minutes  Fistula / Graft Left Forearm Arteriovenous fistula  No Placement Date or Time found.   Placed prior to admission: Yes  Orientation: Left  Access Location: Forearm  Access Type: Arteriovenous fistula  Site Condition No complications  Fistula / Graft Assessment Present;Thrill;Bruit  Status Deaccessed  Drainage Description None

## 2018-09-08 NOTE — Progress Notes (Signed)
[place] [facility] [place]: [name]    MR#:  [id number]  DATE OF BIRTH:  05-May-1961  SUBJECTIVE:  CHIEF COMPLAINT:  No chief complaint on file.   Came with Right foot pain, cold, gangrenous.  on heparin drip, status post angioplasty Pain is not  well controlled with tramadol will change to oxycodone.  No bowel movement for the past 3 days Agreeable for above-knee amputation on Monday   REVIEW OF SYSTEMS:  CONSTITUTIONAL: No fever, fatigue or weakness.  EYES: No blurred or double vision.  EARS, NOSE, AND THROAT: No tinnitus or ear pain.  RESPIRATORY: No cough, shortness of breath, wheezing or hemoptysis.  CARDIOVASCULAR: No chest pain, orthopnea, edema.  GASTROINTESTINAL: No nausea, vomiting, diarrhea or abdominal pain.  GENITOURINARY: No dysuria, hematuria.  ENDOCRINE: No polyuria, nocturia,  HEMATOLOGY: No anemia, easy bruising or bleeding SKIN: No rash or lesion. MUSCULOSKELETAL: No joint pain or arthritis.   NEUROLOGIC: No tingling, numbness, weakness.  PSYCHIATRY: No anxiety or depression.   ROS  DRUG ALLERGIES:  No Known Allergies  VITALS:  Blood pressure (!) 117/52, pulse (!) 108, temperature 99.8 F (37.7 C), resp. rate 17, height 5\' 10"  (1.778 m), weight 113 kg, SpO2 91 %.  PHYSICAL EXAMINATION:   GENERAL:  57 y.o.-year-old patient lying in the bed with no acute distress.  EYES: Pupils equal, round, reactive to light and accommodation. No scleral icterus. Extraocular muscles intact.  HEENT: Head atraumatic, normocephalic. Oropharynx and nasopharynx clear.  NECK:  Supple, no jugular venous distention. No thyroid enlargement, no tenderness.  LUNGS: Normal breath sounds bilaterally, no wheezing, rales,rhonchi or crepitation. No use of accessory muscles of respiration.  CARDIOVASCULAR: S1, S2 normal. No murmurs, rubs, or gallops.  ABDOMEN: Soft, nontender, nondistended. Bowel sounds present. EXTREMITIES: No pedal edema,  cold right leg and foot.  Left side status post below-knee amputation and right side transmetatarsal amputation. NEUROLOGIC: .  Awake, alert and oriented x3 gait not checked.  PSYCHIATRIC: The patient is alert and oriented x 3.  SKIN: No obvious rash, lesion, or ulcer.   Physical Exam LABORATORY PANEL:   CBC Recent Labs  Lab 09/08/18 0433  WBC 21.5*  HGB 9.7*  HCT 31.6*  PLT 264   ------------------------------------------------------------------------------------------------------------------  Chemistries  Recent Labs  Lab 09/08/18 0433  NA 133*  K 6.0*  CL 91*  CO2 26  GLUCOSE 199*  BUN 56*  CREATININE 9.61*  CALCIUM 8.9  MG 2.5*   ------------------------------------------------------------------------------------------------------------------  Cardiac Enzymes No results for input(s): TROPONINI in the last 168 hours. ------------------------------------------------------------------------------------------------------------------  RADIOLOGY:  No results found.  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 Status post angiogram and vascular is recommending right leg AKA scheduled for today,  Discontinue heparin drip as per vascular surgery Continue aspirin, hold Plavix for now. Discontinue tramadol and add oxycodone to the pain management regimen N.p.o. after midnight  *Diabetes mellitus with complications of atherosclerosis and peripheral arteries  hold Diabetic medicines, continue insulin with sliding scale coverage, continue n.p.o., start diet after surgery.  *End-stage renal disease on hemodialysis Nephrologist to help with continuing dialysis in the hospital. Hyperkalemia with potassium of 6 today, dialysis before the  surgery.  Same explained to the patient's wife..  *Constipation MiraLAX, Colace and senna regimen  Dulcolax suppository as  needed  Chronic hypotension: Continue midodrine for dialysis Leukocytosis worsening, WBC up to 21, patient has low-grade temp 99.9.  Fahrenheit, check procalcitonin, start empiric antibiotics.  All the records are reviewed and case discussed with Care Management/Social Workerr. Management plans discussed with the patient, family with the help of Spanish interpreter [place] and they are in agreement.  CODE STATUS: Full.  TOTAL TIME TAKING CARE OF THIS PATIENT: 35 minutes.     POSSIBLE D/C IN ? DAYS, DEPENDING ON CLINICAL CONDITION.   Epifanio Lesches M.D on 09/08/2018   Between 7am to 6pm - Pager - [phone]   After 6pm go to [email or web address] - password [email or web address] [facility]  Office  [phone]  CC: Primary care physician; [name], MD  Note: This dictation was prepared with Dragon dictation along with smaller phrase technology. Any transcriptional errors that result from this process are unintentional.

## 2018-09-08 NOTE — Progress Notes (Signed)
Post HD Assessment    09/08/18 1335  Neurological  Level of Consciousness Alert  Orientation Level Oriented X4  Respiratory  Respiratory Pattern Regular;Unlabored;Symmetrical  Chest Assessment Chest expansion symmetrical  Bilateral Breath Sounds Diminished  Cardiac  Pulse Regular  Heart Sounds S1, S2  Jugular Venous Distention (JVD) No  ECG Monitor Yes  Cardiac Rhythm ST  Antiarrhythmic device No  Vascular  R Radial Pulse +2  L Radial Pulse +2  Edema Right lower extremity  RLE Edema +1  Integumentary  Integumentary (WDL) X  Skin Color Appropriate for ethnicity  Skin Condition Dry;Flaky  Skin Integrity Ecchymosis  Ecchymosis Location Leg  Ecchymosis Location Orientation Right  Musculoskeletal  Musculoskeletal (WDL) X  Generalized Weakness Yes  GU Assessment  Genitourinary (WDL) X (HD pt)  Psychosocial  Psychosocial (WDL) X  Patient Behaviors Irritable  Needs Expressed Physical  Emotional support given Given to patient

## 2018-09-09 ENCOUNTER — Encounter
Admission: EM | Disposition: A | Discharge status: Skilled Nursing Facility | Payer: Self-pay | Source: Home / Self Care | Attending: Internal Medicine

## 2018-09-09 ENCOUNTER — Inpatient Hospital Stay: Payer: Medicare Other | Admitting: Anesthesiology

## 2018-09-09 ENCOUNTER — Encounter: Payer: Self-pay | Admitting: Anesthesiology

## 2018-09-09 DIAGNOSIS — I998 Other disorder of circulatory system: Secondary | ICD-10-CM

## 2018-09-09 DIAGNOSIS — I70261 Atherosclerosis of native arteries of extremities with gangrene, right leg: Secondary | ICD-10-CM

## 2018-09-09 HISTORY — PX: AMPUTATION: SHX166

## 2018-09-09 LAB — GLUCOSE, CAPILLARY
GLUCOSE-CAPILLARY: 199 mg/dL — AB (ref 70–99)
GLUCOSE-CAPILLARY: 188 mg/dL — AB (ref 70–99)
Glucose-Capillary: 184 mg/dL — ABNORMAL HIGH (ref 70–99)
Glucose-Capillary: 183 mg/dL — ABNORMAL HIGH (ref 70–99)
Glucose-Capillary: 196 mg/dL — ABNORMAL HIGH (ref 70–99)

## 2018-09-09 LAB — BASIC METABOLIC PANEL
ANION GAP: 13 (ref 5–15)
BUN: 44 mg/dL — ABNORMAL HIGH (ref 6–20)
CALCIUM: 9.6 mg/dL (ref 8.9–10.3)
CO2: 32 mmol/L (ref 22–32)
Chloride: 94 mmol/L — ABNORMAL LOW (ref 98–111)
Creatinine, Ser: 7.55 mg/dL — ABNORMAL HIGH (ref 0.61–1.24)
GFR, EST AFRICAN AMERICAN: 8 mL/min — AB (ref >60)
GFR, EST NON AFRICAN AMERICAN: 7 mL/min — AB (ref >60)
GLUCOSE: 186 mg/dL — AB (ref 70–99)
Potassium: 4.8 mmol/L (ref 3.5–5.1)
SODIUM: 139 mmol/L (ref 135–145)

## 2018-09-09 LAB — CBC
HCT: 30.2 % — ABNORMAL LOW (ref 39.0–52.0)
Hemoglobin: 9.2 g/dL — ABNORMAL LOW (ref 13.0–17.0)
MCH: 29.6 pg (ref 26.0–34.0)
MCHC: 30.5 g/dL (ref 30.0–36.0)
MCV: 97.1 fL (ref 80.0–100.0)
PLATELETS: 262 10*3/uL (ref 150–400)
RBC: 3.11 MIL/uL — ABNORMAL LOW (ref 4.22–5.81)
RDW: 16.3 % — AB (ref 11.5–15.5)
WBC: 20.1 10*3/uL — AB (ref 4.0–10.5)
nRBC: 0.1 % (ref 0.0–0.2)

## 2018-09-09 LAB — PROTIME-INR
INR: 1.32
PROTHROMBIN TIME: 16.2 s — AB (ref 11.4–15.2)

## 2018-09-09 LAB — MAGNESIUM: MAGNESIUM: 2.3 mg/dL (ref 1.7–2.4)

## 2018-09-09 LAB — APTT: aPTT: 38 seconds — ABNORMAL HIGH (ref 24–36)

## 2018-09-09 LAB — PROCALCITONIN: Procalcitonin: 21.24 ng/mL

## 2018-09-09 SURGERY — AMPUTATION, ABOVE KNEE
Anesthesia: General | Laterality: Right

## 2018-09-09 MED ORDER — SODIUM CHLORIDE FLUSH 0.9 % IV SOLN
INTRAVENOUS | Status: AC
  Filled 2018-09-09: qty 10

## 2018-09-09 MED ORDER — FENTANYL CITRATE (PF) 100 MCG/2ML IJ SOLN
INTRAMUSCULAR | Status: AC
  Filled 2018-09-09: qty 2

## 2018-09-09 MED ORDER — FENTANYL CITRATE (PF) 100 MCG/2ML IJ SOLN
INTRAMUSCULAR | Status: AC
  Administered 2018-09-09: 25 ug via INTRAVENOUS
  Filled 2018-09-09: qty 2

## 2018-09-09 MED ORDER — FENTANYL CITRATE (PF) 100 MCG/2ML IJ SOLN: 25.0000 ug | INTRAMUSCULAR | Status: DC | PRN

## 2018-09-09 MED ORDER — ONDANSETRON HCL 4 MG/2ML IJ SOLN
INTRAMUSCULAR | Status: DC | PRN
  Administered 2018-09-09: 4 mg via INTRAVENOUS

## 2018-09-09 MED ORDER — NEOSTIGMINE METHYLSULFATE 10 MG/10ML IV SOLN
INTRAVENOUS | Status: AC
  Filled 2018-09-09: qty 1

## 2018-09-09 MED ORDER — NEOSTIGMINE METHYLSULFATE 10 MG/10ML IV SOLN
INTRAVENOUS | Status: DC | PRN
  Administered 2018-09-09: 3 mg via INTRAVENOUS

## 2018-09-09 MED ORDER — SUCCINYLCHOLINE CHLORIDE 20 MG/ML IJ SOLN
INTRAMUSCULAR | Status: DC | PRN
  Administered 2018-09-09: 100 mg via INTRAVENOUS

## 2018-09-09 MED ORDER — ONDANSETRON HCL 4 MG/2ML IJ SOLN
INTRAMUSCULAR | Status: AC
  Filled 2018-09-09: qty 2

## 2018-09-09 MED ORDER — HYDROMORPHONE HCL 1 MG/ML IJ SOLN: 0.5000 mg | INTRAMUSCULAR | Status: DC | PRN

## 2018-09-09 MED ORDER — DEXTROSE 5 % IV SOLN
INTRAVENOUS | Status: DC
  Administered 2018-09-09 – 2018-09-14 (×5): via INTRAVENOUS
  Filled 2018-09-09 (×11): qty 1

## 2018-09-09 MED ORDER — GLYCOPYRROLATE 0.2 MG/ML IJ SOLN
INTRAMUSCULAR | Status: AC
  Filled 2018-09-09: qty 3

## 2018-09-09 MED ORDER — EPOETIN ALFA 10000 UNIT/ML IJ SOLN
4000.0000 [IU] | INTRAMUSCULAR | Status: DC
  Administered 2018-09-10 – 2018-09-15 (×3): 4000 [IU] via INTRAVENOUS

## 2018-09-09 MED ORDER — FENTANYL CITRATE (PF) 100 MCG/2ML IJ SOLN
INTRAMUSCULAR | Status: DC | PRN
  Administered 2018-09-09 (×2): 50 ug via INTRAVENOUS

## 2018-09-09 MED ORDER — ROCURONIUM BROMIDE 100 MG/10ML IV SOLN
INTRAVENOUS | Status: DC | PRN
  Administered 2018-09-09: 45 mg via INTRAVENOUS
  Administered 2018-09-09: 5 mg via INTRAVENOUS

## 2018-09-09 MED ORDER — PHENYLEPHRINE HCL 10 MG/ML IJ SOLN
INTRAMUSCULAR | Status: DC | PRN
  Administered 2018-09-09: 100 ug via INTRAVENOUS
  Administered 2018-09-09: 200 ug via INTRAVENOUS
  Administered 2018-09-09 (×3): 100 ug via INTRAVENOUS

## 2018-09-09 MED ORDER — HYDROMORPHONE HCL 1 MG/ML IJ SOLN
2.0000 mg | INTRAMUSCULAR | Status: DC | PRN
  Administered 2018-09-09 – 2018-09-11 (×6): 2 mg via INTRAVENOUS
  Filled 2018-09-09 (×6): qty 2

## 2018-09-09 MED ORDER — FENTANYL CITRATE (PF) 100 MCG/2ML IJ SOLN
25.0000 ug | Freq: Once | INTRAMUSCULAR | Status: AC
  Administered 2018-09-10: 25 ug via INTRAVENOUS
  Filled 2018-09-09: qty 2

## 2018-09-09 MED ORDER — LIDOCAINE HCL (CARDIAC) PF 100 MG/5ML IV SOSY
PREFILLED_SYRINGE | INTRAVENOUS | Status: DC | PRN
  Administered 2018-09-09: 100 mg via INTRAVENOUS

## 2018-09-09 MED ORDER — ETOMIDATE 2 MG/ML IV SOLN
INTRAVENOUS | Status: DC | PRN
  Administered 2018-09-09: 20 mg via INTRAVENOUS

## 2018-09-09 MED ORDER — GLYCOPYRROLATE 0.2 MG/ML IJ SOLN
INTRAMUSCULAR | Status: DC | PRN
  Administered 2018-09-09: 0.6 mg via INTRAVENOUS

## 2018-09-09 MED ORDER — FENTANYL CITRATE (PF) 100 MCG/2ML IJ SOLN
25.0000 ug | Freq: Once | INTRAMUSCULAR | Status: AC
  Administered 2018-09-09: 25 ug via INTRAVENOUS

## 2018-09-09 MED ORDER — ONDANSETRON HCL 4 MG/2ML IJ SOLN: 4.0000 mg | Freq: Once | INTRAMUSCULAR | Status: DC | PRN

## 2018-09-09 SURGICAL SUPPLY — 37 items
BANDAGE ELASTIC 6 LF NS (GAUZE/BANDAGES/DRESSINGS) ×3 IMPLANT
BLADE SAGITTAL WIDE XTHICK NO (BLADE) ×1 IMPLANT
BLADE SAW GIGLI 510 (BLADE) ×2 IMPLANT
BLADE SAW GIGLI 510MM (BLADE) ×1
BLADE SAW SAG 25.4X90 (BLADE) ×2 IMPLANT
BNDG COHESIVE 4X5 TAN STRL (GAUZE/BANDAGES/DRESSINGS) ×3 IMPLANT
BNDG GAUZE 4.5X4.1 6PLY STRL (MISCELLANEOUS) ×6 IMPLANT
CANISTER SUCT 1200ML W/VALVE (MISCELLANEOUS) ×3 IMPLANT
COVER WAND RF STERILE (DRAPES) ×3 IMPLANT
DRAPE INCISE IOBAN 66X45 STRL (DRAPES) ×3 IMPLANT
DRAPE INCISE IOBAN 66X60 STRL (DRAPES) ×3 IMPLANT
DURAPREP 26ML APPLICATOR (WOUND CARE) ×3 IMPLANT
ELECT CAUTERY BLADE 6.4 (BLADE) ×3 IMPLANT
ELECT REM PT RETURN 9FT ADLT (ELECTROSURGICAL) ×3
ELECTRODE REM PT RTRN 9FT ADLT (ELECTROSURGICAL) ×1 IMPLANT
GAUZE PETRO XEROFOAM 1X8 (MISCELLANEOUS) ×6 IMPLANT
GLOVE BIO SURGEON STRL SZ7 (GLOVE) ×6 IMPLANT
GLOVE INDICATOR 7.5 STRL GRN (GLOVE) ×3 IMPLANT
GOWN STRL REUS W/ TWL LRG LVL3 (GOWN DISPOSABLE) ×1 IMPLANT
GOWN STRL REUS W/ TWL XL LVL3 (GOWN DISPOSABLE) ×2 IMPLANT
GOWN STRL REUS W/TWL LRG LVL3 (GOWN DISPOSABLE) ×2
GOWN STRL REUS W/TWL XL LVL3 (GOWN DISPOSABLE) ×4
HANDLE YANKAUER SUCT BULB TIP (MISCELLANEOUS) ×3 IMPLANT
KIT TURNOVER KIT A (KITS) ×3 IMPLANT
LABEL OR SOLS (LABEL) ×3 IMPLANT
NS IRRIG 1000ML POUR BTL (IV SOLUTION) ×3 IMPLANT
PACK EXTREMITY ARMC (MISCELLANEOUS) ×3 IMPLANT
PAD ABD DERMACEA PRESS 5X9 (GAUZE/BANDAGES/DRESSINGS) ×6 IMPLANT
PAD PREP 24X41 OB/GYN DISP (PERSONAL CARE ITEMS) ×3 IMPLANT
SPONGE LAP 18X18 RF (DISPOSABLE) ×3 IMPLANT
STAPLER SKIN PROX 35W (STAPLE) ×3 IMPLANT
STOCKINETTE M/LG 89821 (MISCELLANEOUS) ×3 IMPLANT
SUT SILK 2 0 (SUTURE) ×2
SUT SILK 2 0 SH (SUTURE) ×6 IMPLANT
SUT SILK 2-0 18XBRD TIE 12 (SUTURE) ×1 IMPLANT
SUT VIC AB 0 CT1 36 (SUTURE) ×8 IMPLANT
SUT VIC AB 2-0 CT1 (SUTURE) ×6 IMPLANT

## 2018-09-09 NOTE — OR Nursing (Signed)
Consent was not signed by Dr. Marcello Moores, Anesthesia MD. He had already left for the day before the procedure began.

## 2018-09-09 NOTE — OR Nursing (Signed)
Medicated with 25 mcs of fentanyl per Dr. Marcello Moores. Pt. Resting with eyes closed but arouses to name. He reports his pain is getting better. O2 sat dropped to 87 %.  at 2 L applied per Dr. Marcello Moores with sponts increase to 100%.

## 2018-09-09 NOTE — Progress Notes (Signed)
Nutrition Follow Up Note   DOCUMENTATION CODES:   Obesity unspecified  INTERVENTION:   Nepro Shake po BID, each supplement provides 425 kcal and 19 grams protein  Rena-vite daily  Ocuvite daily for wound healing (provides zinc, vitamin A, vitamin C, Vitamin E, copper, and selenium)  Vitamin C 500mg  po BID  NUTRITION DIAGNOSIS:   Increased nutrient needs related to chronic illness(ESRD on HD, wound healing ) as evidenced by increased estimated needs.  GOAL:   Patient will meet greater than or equal to 90% of their needs  -progressing   MONITOR:   PO intake, Supplement acceptance, Labs, Weight trends, Skin, I & O's  ASSESSMENT:   57 year old gentleman well-known to our service who has a long-standing history of atherosclerotic occlusive disease particularly of the right lower extremity that has been associated with multiple open wounds which are nonhealing.    Pt with L AVF   Unable to see pt again today as pt in surgery at time of RD visit. Pt NPO today for scheduled AKA. Pt eating fairly well since admit and drinking some Nepro. Suspect pt with scurvy; pt completed 5 days of IV vitamin C can transition to oral administration now. RD will obtain nutrition history and exam at follow up. Recommend continue supplements and vitamin after discharge.   Medications reviewed and include: aspirin, cinacalcet, epoetin, ferrous sulfate, insulin, rena-vite, ocuvite, protonix, miralax, senokot, renvela, vitamin C, zosyn  Labs reviewed: K 4.8 wnl, Cl 94(L), BUN 44(H), creat 7.55(H) Wbc 20.1(H), Hgb 9.2(L), Hct 30.2(L) cbgs- 143, 174, 218, 184, 183 x 24 hrs  Diet Order:   Diet Order            Diet NPO time specified  Diet effective midnight             EDUCATION NEEDS:   Not appropriate for education at this time  Skin:  Skin Assessment: Reviewed RN Assessment(Ecchymosis, multiple non-healing wounds RLE)  Last BM:  11/25  Height:   Ht Readings from Last 1 Encounters:   09/03/18 5\' 10"  (1.778 m)    Weight:   Wt Readings from Last 1 Encounters:  09/08/18 113.3 kg    Ideal Body Weight:  75.5 kg  BMI:  Body mass index is 35.84 kg/m.  Estimated Nutritional Needs:   Kcal:  2200-2500kcal/day   Protein:  125-135g/day   Fluid:  1557ml/day  Koleen Distance MS, RD, LDN Pager #- [phone] Office#- [phone] After Hours Pager: [phone]

## 2018-09-09 NOTE — Transfer of Care (Signed)
Immediate Anesthesia Transfer of Care Note  Patient: [name]  Procedure(s) Performed: AMPUTATION ABOVE KNEE (Right )  Patient Location: PACU  Anesthesia Type:General  Level of Consciousness: awake  Airway & Oxygen Therapy: Patient connected to face mask oxygen  Post-op Assessment: Post -op Vital signs reviewed and stable  Post vital signs: stable  Last Vitals:  Vitals Value Taken Time  BP 113/54 09/09/2018  5:17 PM  Temp    Pulse 103 09/09/2018  5:17 PM  Resp 24 09/09/2018  5:17 PM  SpO2 96 % 09/09/2018  5:17 PM  Vitals shown include unvalidated device data.  Last Pain:  Vitals:   09/09/18 1534  TempSrc:   PainSc: 5       Patients Stated Pain Goal: 3 (41/14/64 3142)  Complications: No apparent anesthesia complications

## 2018-09-09 NOTE — Progress Notes (Signed)
[facility]  ROUNDING NOTE   Subjective:   Denies any acute acute complaints N.p.o. for amputation surgery today  Objective:  Vital signs in last 24 hours:  Temp:  [98.4 F (36.9 C)-100.1 F (37.8 C)] 98.4 F (36.9 C) (11/26 0624) Pulse Rate:  [85-107] 85 (11/26 0624) Resp:  [11-26] 20 (11/26 0624) BP: (79-137)/(50-87) 102/61 (11/26 0624) SpO2:  [93 %-100 %] 94 % (11/26 0624) Weight:  [113.3 kg] 113.3 kg (11/25 1330)  Weight change:  Filed Weights   09/03/18 1538 09/08/18 0934 09/08/18 1330  Weight: 113 kg 116.9 kg 113.3 kg    Intake/Output: I/O last 3 completed shifts: In: 714.3 [I.V.:664.4; IV Piggyback:49.9] Out: 2943 [Other:2942; Stool:1]   Intake/Output this shift:  No intake/output data recorded.  Physical Exam: General: NAD,   Head: Normocephalic, atraumatic. Moist oral mucosal membranes  Eyes: Anicteric,   Neck: Supple,   Lungs:  Clear to auscultation  Heart: Regular rate and rhythm  Abdomen:  Soft, nontender,   Extremities: Right lower extremity with mottling and cold to touch, left BKA  Neurologic: Nonfocal, moving all four extremities  Skin: No lesions  Access: AVF    Basic Metabolic Panel: Recent Labs  Lab 09/03/18 1600 09/04/18 0146 09/05/18 1202 09/08/18 0433 09/08/18 1405 09/09/18 0552  NA 136 137 135 133*  --  139  K 5.7* 5.3* 5.8* 6.0* 4.9 4.8  CL 93* 96* 96* 91*  --  94*  CO2 28 29 28 26   --  32  GLUCOSE 228* 168* 183* 199*  --  186*  BUN 25* 35* 45* 56*  --  44*  CREATININE 5.27* 6.26* 7.45* 9.61*  --  7.55*  CALCIUM 9.1 9.1 8.7* 8.9  --  9.6  MG  --   --   --  2.5*  --  2.3  PHOS  --   --  5.6*  --   --   --     Liver Function Tests: Recent Labs  Lab 09/05/18 1202  ALBUMIN 3.5   No results for input(s): LIPASE, AMYLASE in the last 168 hours. No results for input(s): AMMONIA in the last 168 hours.  CBC: Recent Labs  Lab 09/03/18 1600  09/05/18 0256 09/06/18 0520 09/07/18 0609 09/08/18 0433  09/09/18 0552  WBC 13.4*   < > 13.6* 12.8* 14.2* 21.5* 20.1*  NEUTROABS 10.5*  --   --   --   --   --   --   HGB 13.2   < > 11.1* 10.0* 9.9* 9.7* 9.2*  HCT 43.0   < > 36.6* 33.6* 32.4* 31.6* 30.2*  MCV 97.3   < > 97.6 98.2 96.7 96.6 97.1  PLT 309   < > 288 254 259 264 262   < > = values in this interval not displayed.    Cardiac Enzymes: No results for input(s): CKTOTAL, CKMB, CKMBINDEX, TROPONINI in the last 168 hours.  BNP: Invalid input(s): POCBNP  CBG: Recent Labs  Lab 09/08/18 0736 09/08/18 1145 09/08/18 1626 09/08/18 2205 09/09/18 0756  GLUCAP 202* 143* 174* 218* 184*    Microbiology: Results for orders placed or performed during the hospital encounter of 08/12/18  MRSA PCR Screening     Status: None   Collection Time: 08/13/18  9:08 AM  Result Value Ref Range Status   MRSA by PCR NEGATIVE NEGATIVE Final    Comment:        The GeneXpert MRSA Assay (FDA approved for NASAL specimens only), is one component  of a comprehensive MRSA colonization surveillance program. It is not intended to diagnose MRSA infection nor to guide or monitor treatment for MRSA infections. Performed at [facility], [address] [place], [place] [address]     Coagulation Studies: Recent Labs    09/08/18 0433 09/09/18 0552  LABPROT 15.3* 16.2*  INR 1.22 1.32    Urinalysis: No results for input(s): COLORURINE, LABSPEC, PHURINE, GLUCOSEU, HGBUR, BILIRUBINUR, KETONESUR, PROTEINUR, UROBILINOGEN, NITRITE, LEUKOCYTESUR in the last 72 hours.  Invalid input(s): APPERANCEUR    Imaging: No results found.   Medications:   . sodium chloride Stopped (09/09/18 0223)  . small volume/piggyback Museum/gallery curator    . piperacillin-tazobactam (ZOSYN)  IV 12.5 mL/hr at 09/09/18 0311   . aspirin EC  81 mg Oral Daily  . cinacalcet  60 mg Oral Once per day on Mon Wed Fri  . feeding supplement (NEPRO CARB STEADY)  237 mL Oral BID BM  . ferrous sulfate  325 mg Oral TID WC  . insulin  aspart  0-9 Units Subcutaneous TID WC  . midodrine  5 mg Oral TID WC  . multivitamin  1 tablet Oral QHS  . multivitamin-lutein  1 capsule Oral Daily  . pantoprazole  40 mg Oral BID  . polyethylene glycol  17 g Oral BID  . roflumilast  500 mcg Oral Daily  . senna-docusate  1 tablet Oral BID  . sevelamer carbonate  2,400 mg Oral TID WC  . sodium zirconium cyclosilicate  10 g Oral Daily  . vitamin C  250 mg Oral BID   sodium chloride, acetaminophen, docusate sodium, HYDROmorphone (DILAUDID) injection **OR** HYDROmorphone (DILAUDID) injection, lidocaine-prilocaine, ondansetron (ZOFRAN) IV, oxyCODONE  Assessment/ Plan:  [name] is a 57 y.o. Hispanic male with end stage renal disease, peripheral vascular disease, hypertension, CVA, hyperlipidemia, diabetes mellitus type II who is admitted to [facility] 113 kg Left AVF  1. End Stage Renal Disease:  MWF schedule. With hyperkalemia Next HD to be scheduled for Wednesday Midodrine before treatment.    2. Anemia of chronic kidney disease: hemoglobin 9.2 - low dose EPO to be scheduled with HD  3. Hypotension - Patient takes midodrine before his dialysis treatments  4. Secondary Hyperparathyroidism:  - sevelamer and cinacalcet with meals.   5. Peripheral arterial disease: amputation scheduled for 11/26  - Appreciate vascular input.   6. Diabetes mellitus type II with chronic kidney disease: Continue glucose control.    LOS: 6 [name] 11/26/20199:55 AM

## 2018-09-09 NOTE — Progress Notes (Signed)
Inpatient Diabetes Program Recommendations  AACE/ADA: New Consensus Statement on Inpatient Glycemic Control (2019)  Target Ranges:  Prepandial:   less than 140 mg/dL      Peak postprandial:   less than 180 mg/dL (1-2 hours)      Critically ill patients:  140 - 180 mg/dL   Results for ROCHESTER, SERPE (MRN [id number]) as of 09/09/2018 10:20  Ref. Range 09/08/2018 07:36 09/08/2018 11:45 09/08/2018 16:26 09/08/2018 22:05 09/09/2018 07:56  Glucose-Capillary Latest Ref Range: 70 - 99 mg/dL 202 (H) 143 (H) 174 (H) 218 (H) 184 (H)    Review of Glycemic Control  Diabetes history: DM2 Outpatient Diabetes medications: Glipizide 10 mg daily Current orders for Inpatient glycemic control: Novolog 0-9 units TID with meals  Inpatient Diabetes Program Recommendations:   Insulin-Correction: Please consider ordering Novolog 0-5 units QHS for bedtime correction scale.  Insulin - Meal Coverage: Once diet is resumed and if post prandial glucose is consistently greater than 180 mg/dl, please consider ordering Novolog 3 units TID with meals for meal coverage if patient eats at least 50% of meals.  Thanks, Barnie Alderman, RN, MSN, CDE Diabetes Coordinator Inpatient Diabetes Program [phone] (Team Pager from 8am to 5pm)

## 2018-09-09 NOTE — Op Note (Signed)
[facility]   OPERATIVE NOTE   PROCEDURE:  Right above-the-knee amputation  PRE-OPERATIVE DIAGNOSIS: Right foot and lower leg gangrene, irreversible ischemia of the right leg  POST-OPERATIVE DIAGNOSIS: same as above  SURGEON:  Leotis Pain, MD  ASSISTANT(S): Hezzie Bump, PA-C  ANESTHESIA: general  ESTIMATED BLOOD LOSS: 25 cc  FINDING(S): none  SPECIMEN(S):  Right above-the-knee amputation  INDICATIONS:   [name] is a 57 y.o. male who presents with right lower extremity irreversible ischemia and right foot and lower leg gangrene.  He has had multiple attempts at revascularization, but he has not unreconstructable vascular disease with irreversible ischemic changes to the right foot and lower leg.  He does not have enough perfusion to heal a below-knee amputation.  The patient is scheduled for a right above-the-knee amputation.  I discussed in depth with the patient the risks, benefits, and alternatives to this procedure.  The patient is aware that the risk of this operation included but are not limited to:  bleeding, infection, myocardial infarction, stroke, death, failure to heal amputation wound, and possible need for more proximal amputation.  The patient is aware of the risks and agrees proceed forward with the procedure. An assistant was present during the procedure to help facilitate the exposure and expedite the procedure.  DESCRIPTION: After full informed written consent was obtained from the patient, the patient was taken to the operating room, and placed supine upon the operating table.  Prior to induction, the patient received IV antibiotics.  The patient was then prepped and draped in the standard fashion for a right above-the-knee amputation.  After obtaining adequate anesthesia, the patient was prepped and draped in the standard fashion for a above-the-knee amputation.  I marked out the anterior and posterior flaps for a fish-mouth type of  amputation. The assistant provided retraction and mobilization to help facilitate exposure and expedite the procedure throughout the entire procedure.  This included following suture, using retractors, and optimizing lighting.  I made the incisions for these flaps, and then dissected through the subcutaneous tissue, fascia, and muscles circumferentially.  I elevated  the periosteal tissue 4-5 cm more proximal than the anterior skin flap.  I then transected the femur with a power saw at this level.  Then I smoothed out the rough edges of the bone.  At this point, the specimen was passed off the field as the above-the-knee amputation.  At this point, I clamped all visibly bleeding arteries and veins using a combination of suture ligation with silk suture and electrocautery.   Bleeding continued to be controlled with electrocautery and suture ligature.  The stump was washed off with sterile normal saline and no further active bleeding was noted.  I reapproximated the anterior and posterior fascia  with interrupted stitches of 0 Vicryl.  This was completed along the entire length of anterior and posterior fascia until there were no more loose space in the fascial line. The subcutaneous tissue was then approximated with 2-0 vicryl sutures. The skin was then  reapproximated with staples.  The stump was washed off and dried.  The incision was dressed with Xeroform and ABD pads, and  then fluffs were applied.  Kerlix was wrapped around the leg and then gently an ACE wrap was applied.  A large Ioban was then placed over the ACE wrap to secure the dressing. The patient was then awakened and take to the recovery room in stable condition.   COMPLICATIONS: none  CONDITION: stable  Leotis Pain  09/09/2018, 5:02 PM   This note was created with Dragon Medical transcription system. Any errors in dictation are purely unintentional.

## 2018-09-09 NOTE — Progress Notes (Signed)
I communicated with patient via video translator Angie # [phone]. I explained medications, assessment procedures and pain management. Patient understood and was comfortable with this process.

## 2018-09-09 NOTE — Anesthesia Procedure Notes (Signed)
Procedure Name: Intubation Date/Time: 09/09/2018 4:08 PM Performed by: Aline Brochure, CRNA Pre-anesthesia Checklist: Patient identified, Emergency Drugs available, Suction available and Patient being monitored Patient Re-evaluated:Patient Re-evaluated prior to induction Oxygen Delivery Method: Circle system utilized Preoxygenation: Pre-oxygenation with 100% oxygen Induction Type: IV induction Ventilation: Oral airway inserted - appropriate to patient size and Mask ventilation without difficulty Laryngoscope Size: McGraph and 4 Grade View: Grade I Tube type: Oral Tube size: 7.5 mm Number of attempts: 1 Airway Equipment and Method: Stylet and Video-laryngoscopy Placement Confirmation: ETT inserted through vocal cords under direct vision,  positive ETCO2 and breath sounds checked- equal and bilateral Secured at: 25 cm Tube secured with: Tape Dental Injury: Teeth and Oropharynx as per pre-operative assessment  Difficulty Due To: Difficulty was anticipated

## 2018-09-09 NOTE — H&P (Signed)
[facility] History & Physical Update  The patient was interviewed and re-examined.  The patient's previous History and Physical has been reviewed and is unchanged.  There is no change in the plan of care. We plan to proceed with the scheduled procedure of right AKA.  Leotis Pain, MD  09/09/2018, 3:12 PM

## 2018-09-09 NOTE — Progress Notes (Signed)
Interpreter, Dorthey Sawyer,  at bedside with doctor and RN to answer any questions about pt.'s treatment plan. Pt and family that was within the room were updated about procedure today will be this afternoon by Dr Lucky Cowboy. RN went over all medications and assessment while interpreter was in the room. All questions were answered from pt and pt.'s family prior to the  interpreter leaving the room.   Alise Calais [employer]

## 2018-09-09 NOTE — Progress Notes (Signed)
[place] [facility] [place]: [name]    MR#:  [id number]  DATE OF BIRTH:  [birth year]  SUBJECTIVE:  CHIEF COMPLAINT:  No chief complaint on file.   Came with Right foot pain, cold, gangrenous.  on heparin drip, status post angioplasty Patient got hemodialysis today for hyperkalemia, cannot have right leg amputation today.  REVIEW OF SYSTEMS:  CONSTITUTIONAL: No fever, fatigue or weakness.  EYES: No blurred or double vision.  EARS, NOSE, AND THROAT: No tinnitus or ear pain.  RESPIRATORY: No cough, shortness of breath, wheezing or hemoptysis.  CARDIOVASCULAR: No chest pain, orthopnea, edema.  GASTROINTESTINAL: No nausea, vomiting, diarrhea or abdominal pain.  GENITOURINARY: No dysuria, hematuria.  ENDOCRINE: No polyuria, nocturia,  HEMATOLOGY: No anemia, easy bruising or bleeding SKIN: No rash or lesion. MUSCULOSKELETAL: No joint pain or arthritis.   NEUROLOGIC: No tingling, numbness, weakness.  PSYCHIATRY: No anxiety or depression.   ROS  DRUG ALLERGIES:  No Known Allergies  VITALS:  Blood pressure 109/62, pulse 71, temperature 98.9 F (37.2 C), temperature source Oral, resp. rate 20, height 5\' 10"  (1.778 m), weight 113.3 kg, SpO2 97 %.  PHYSICAL EXAMINATION:   GENERAL:  57 y.o.-year-old patient lying in the bed with no acute distress.  EYES: Pupils equal, round, reactive to light and accommodation. No scleral icterus. Extraocular muscles intact.  HEENT: Head atraumatic, normocephalic. Oropharynx and nasopharynx clear.  NECK:  Supple, no jugular venous distention. No thyroid enlargement, no tenderness.  LUNGS: Normal breath sounds bilaterally, no wheezing, rales,rhonchi or crepitation. No use of accessory muscles of respiration.  CARDIOVASCULAR: S1, S2 normal. No murmurs, rubs, or gallops.  ABDOMEN: Soft, nontender, nondistended. Bowel sounds present. EXTREMITIES: No pedal edema, cold right leg and foot.  Left side status post  below-knee amputation and right side transmetatarsal amputation. NEUROLOGIC: .  Awake, alert and oriented x3 gait not checked.  PSYCHIATRIC: The patient is alert and oriented x 3.  SKIN: No obvious rash, lesion, or ulcer.   Physical Exam LABORATORY PANEL:   CBC Recent Labs  Lab 09/09/18 0552  WBC 20.1*  HGB 9.2*  HCT 30.2*  PLT 262   ------------------------------------------------------------------------------------------------------------------  Chemistries  Recent Labs  Lab 09/09/18 0552  NA 139  K 4.8  CL 94*  CO2 32  GLUCOSE 186*  BUN 44*  CREATININE 7.55*  CALCIUM 9.6  MG 2.3   ------------------------------------------------------------------------------------------------------------------  Cardiac Enzymes No results for input(s): TROPONINI in the last 168 hours. ------------------------------------------------------------------------------------------------------------------  RADIOLOGY:  No results found.  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 Status post angiogram and vascular is recommending right leg AKA .  Patient and his wife are agreeable Currently IV heparin drip. Continue aspirin, hold Plavix for now. Discontinue tramadol and add oxycodone to the pain management regimen N.p.o. after midnight   *Diabetes mellitus with complications of atherosclerosis and peripheral arteries holdOral meds, continue sliding scale insulin with coverage.  *End-stage renal disease on hemodialysis Nephrologist to help with continuing dialysis in the hospital. Continue supplemental medications as per nephrologist.  *Hyperkalemia; patient needs emergency hemodialysis today.  *Constipation MiraLAX, Colace and senna regimen   Dulcolax suppository as needed Daliresp is added for narcotic induced constipation  All the records are reviewed and case discussed with Care Management/Social Workerr. Management plans discussed with  the patient, family with the help of Spanish interpreter Spivey and they are in agreement.  CODE STATUS: Full.  TOTAL TIME TAKING CARE OF THIS PATIENT: 35 minutes.  More than 50% time spent in counseling, coordination of care. Got call from vascular surgery that patient cannot have right below-knee amputation because patient had dialysis today, patient wife is very upset about that.  POSSIBLE D/C IN ? DAYS, DEPENDING ON CLINICAL CONDITION.   Epifanio Lesches M.D on 09/09/2018   Between 7am to 6pm - Pager - [phone]   After 6pm go to [email or web address] - password [email or web address] [facility]  Office  [phone]  CC: Primary care physician; [name], MD  Note: This dictation was prepared with Dragon dictation along with smaller phrase technology. Any transcriptional errors that result from this process are unintentional.

## 2018-09-09 NOTE — Progress Notes (Signed)
Verbal order from Dr Lucky Cowboy that it is ok to give PO Aspirin prior to procedure.   [name] [employer]

## 2018-09-09 NOTE — Anesthesia Post-op Follow-up Note (Signed)
Anesthesia QCDR form completed.        

## 2018-09-09 NOTE — NC FL2 (Signed)
[place] LEVEL OF CARE SCREENING TOOL     IDENTIFICATION  Patient Name: [name] Birthdate: 1961-03-20 Sex: male Admission Date (Current Location): 09/03/2018  [place] and [place] Number:  Engineering geologist and Address:  [facility], [address] [place], [place] [address]      Provider Number: [id number]  Attending Physician Name and Address:  Epifanio Lesches, MD  Relative Name and Phone Number:       Current Level of Care: Hospital Recommended Level of Care: [place] Prior Approval Number:    Date Approved/Denied:   PASRR Number: [id number]  Discharge Plan: SNF    Current Diagnoses: Patient Active Problem List   Diagnosis Date Noted  . Limb ischemia 09/03/2018  . Arterial occlusion 08/12/2018  . ESRD on dialysis ([place]) 11/09/2016  . Diabetes ([place]) 11/09/2016  . PVD (peripheral vascular disease) ([place]) 11/09/2016  . Cardiac arrest ([place]) 04/28/2016  . Atherosclerosis of extremity with gangrene ([place]) 04/25/2016  . Diabetic polyneuropathy associated with type 2 diabetes mellitus ([place]) 12/09/2015  . Diabetic ulcer of right foot associated with type 2 diabetes mellitus ([place]) 12/09/2015  . Anemia 03/24/2014  . Left foot burn 03/17/2014  . Leukocytosis 03/17/2014  . Obesity 03/17/2014  . PND (paroxysmal nocturnal dyspnea) 06/29/2013  . Volume overload 06/29/2013  . Diastolic congestive heart failure, NYHA class 2 ([place]) 06/28/2013  . Shortness of breath 06/28/2013  . After-cataract with vision obscured 08/01/2011  . Anisometropia 08/01/2011  . Background diabetic retinopathy ([place]) 08/01/2011  . Senile nuclear sclerosis 08/01/2011    Orientation RESPIRATION BLADDER Height & Weight     Self, Place, Situation, Time  Normal Continent Weight: 249 lb 12.5 oz (113.3 kg) Height:  5\' 10"  (177.8 cm)  BEHAVIORAL SYMPTOMS/MOOD NEUROLOGICAL BOWEL NUTRITION STATUS  (none) (none) Continent Diet(regular)   AMBULATORY STATUS COMMUNICATION OF NEEDS Skin   Extensive Assist Verbally Surgical wounds                       Personal Care Assistance Level of Assistance  Bathing, Dressing, Feeding Bathing Assistance: Limited assistance Feeding assistance: Limited assistance Dressing Assistance: Limited assistance     Functional Limitations Info  (none)          SPECIAL CARE FACTORS FREQUENCY  PT (By licensed PT)                    Contractures Contractures Info: Not present    Additional Factors Info  Code Status Code Status Info: full             Current Medications (09/09/2018):  This is the current hospital active medication list Current Facility-Administered Medications  Medication Dose Route Frequency Provider Last Rate Last Dose  . 0.9 %  sodium chloride infusion   Intravenous PRN Gouru, Aruna, MD 10 mL/hr at 09/09/18 1301 250 mL at 09/09/18 1301  . acetaminophen (TYLENOL) tablet 650 mg  650 mg Oral Q6H PRN [name], MD   650 mg at 09/08/18 2155  . ascorbic acid 500 mg in dextrose 5 % 50 mL injection   Intravenous Q24H Murlean Iba, MD      . aspirin EC tablet 81 mg  81 mg Oral Daily [name], MD   81 mg at 09/09/18 1112  . cinacalcet (SENSIPAR) tablet 60 mg  60 mg Oral Once per day on Mon Wed Fri [name], [name] S, MD   60 mg at 09/05/18 1810  .  docusate sodium (COLACE) capsule 100 mg  100 mg Oral BID PRN [name], MD   100 mg at 09/07/18 1716  . [START ON 09/10/2018] epoetin alfa (EPOGEN,PROCRIT) injection 4,000 Units  4,000 Units Intravenous Q M,W,F-HD Singh, Harmeet, MD      . feeding supplement (NEPRO CARB STEADY) liquid 237 mL  237 mL Oral BID BM [name], MD   237 mL at 09/07/18 1344  . ferrous sulfate tablet 325 mg  325 mg Oral TID WC [name], MD   325 mg at 09/08/18 1636  . HYDROmorphone (DILAUDID) injection 0.5 mg  0.5 mg Intravenous Q3H PRN Arta Silence, MD   0.5 mg at 09/05/18 2203   Or  . HYDROmorphone (DILAUDID) injection 1  mg  1 mg Intravenous Q3H PRN Arta Silence, MD   1 mg at 09/09/18 1114  . insulin aspart (novoLOG) injection 0-9 Units  0-9 Units Subcutaneous TID WC [name], MD   2 Units at 09/09/18 1248  . lidocaine-prilocaine (EMLA) cream 1 application  1 application Topical PRN [name], MD      . midodrine (PROAMATINE) tablet 5 mg  5 mg Oral TID WC Kolluru, Sarath, MD   5 mg at 09/09/18 1113  . multivitamin (RENA-VIT) tablet 1 tablet  1 tablet Oral QHS [name], MD   1 tablet at 09/08/18 2136  . multivitamin-lutein (OCUVITE-LUTEIN) capsule 1 capsule  1 capsule Oral Daily [name], MD   1 capsule at 09/07/18 1108  . ondansetron (ZOFRAN) injection 4 mg  4 mg Intravenous Q6H PRN [name], MD   4 mg at 09/08/18 0900  . oxyCODONE (Oxy IR/ROXICODONE) immediate release tablet 5 mg  5 mg Oral Q6H PRN Gouru, Aruna, MD   5 mg at 09/07/18 1714  . pantoprazole (PROTONIX) EC tablet 40 mg  40 mg Oral BID [name], MD   40 mg at 09/09/18 1112  . piperacillin-tazobactam (ZOSYN) IVPB 3.375 g  3.375 g Intravenous Q12H Rocky Morel, RPH 12.5 mL/hr at 09/09/18 1302 3.375 g at 09/09/18 1302  . polyethylene glycol (MIRALAX / GLYCOLAX) packet 17 g  17 g Oral BID [name], MD   17 g at 09/07/18 2119  . roflumilast (DALIRESP) tablet 500 mcg  500 mcg Oral Daily Gouru, Aruna, MD   500 mcg at 09/09/18 1112  . senna-docusate (Senokot-S) tablet 1 tablet  1 tablet Oral BID Vaughan Basta, MD   1 tablet at 09/07/18 2119  . sevelamer carbonate (RENVELA) tablet 2,400 mg  2,400 mg Oral TID WC [name], MD   2,400 mg at 09/07/18 1716  . sodium zirconium cyclosilicate (LOKELMA) packet 10 g  10 g Oral Daily Lavonia Dana, MD   Stopped at 09/09/18 1003  . vitamin C (ASCORBIC ACID) tablet 250 mg  250 mg Oral BID [name], MD         Discharge Medications: Please see discharge summary for a list of discharge medications.  Relevant Imaging Results:  Relevant Lab Results:   Additional  Information    Shela Leff, LCSW

## 2018-09-09 NOTE — Anesthesia Postprocedure Evaluation (Signed)
Anesthesia Post Note  Patient: [name]  Procedure(s) Performed: AMPUTATION ABOVE KNEE (Right )  Patient location during evaluation: PACU Anesthesia Type: General Level of consciousness: awake and alert Pain management: pain level controlled Vital Signs Assessment: post-procedure vital signs reviewed and stable Respiratory status: spontaneous breathing and respiratory function stable Cardiovascular status: stable Anesthetic complications: no     Last Vitals:  Vitals:   09/09/18 1731 09/09/18 1737  BP:  (!) 111/51  Pulse: (!) 103 (!) 103  Resp: (!) 23 (!) 23  Temp:    SpO2: (!) 86% 92%    Last Pain:  Vitals:   09/09/18 1731  TempSrc:   PainSc: 0-No pain                 , K

## 2018-09-09 NOTE — Progress Notes (Signed)
[place] [facility] [place]: [name]    MR#:  [id number]  DATE OF BIRTH:  [birth year]  SUBJECTIVE:  CHIEF COMPLAINT:  No chief complaint on file.   Came with Right foot pain, cold, gangrenous.  on heparin drip, status post angioplasty S/p right leg amputation today.Marland Kitchen  REVIEW OF SYSTEMS:  CONSTITUTIONAL: No fever, fatigue or weakness.  EYES: No blurred or double vision.  EARS, NOSE, AND THROAT: No tinnitus or ear pain.  RESPIRATORY: No cough, shortness of breath, wheezing or hemoptysis.  CARDIOVASCULAR: No chest pain, orthopnea, edema.  GASTROINTESTINAL: No nausea, vomiting, diarrhea or abdominal pain.  GENITOURINARY: No dysuria, hematuria.  ENDOCRINE: No polyuria, nocturia,  HEMATOLOGY: No anemia, easy bruising or bleeding SKIN: No rash or lesion. MUSCULOSKELETAL: right beloww knee amputationNEUROLOGIC: No tingling, numbness, weakness.  PSYCHIATRY: No anxiety or depression.     DRUG ALLERGIES:  No Known Allergies  VITALS:  Blood pressure (!) 136/57, pulse 88, temperature 98.6 F (37 C), temperature source Oral, resp. rate 16, height 5\' 10"  (1.778 m), weight 113.3 kg, SpO2 90 %.  PHYSICAL EXAMINATION:   GENERAL:  57 y.o.-year-old patient lying in the bed with no acute distress.  EYES: Pupils equal, round, reactive to light and accommodation. No scleral icterus. Extraocular muscles intact.  HEENT: Head atraumatic, normocephalic. Oropharynx and nasopharynx clear.  NECK:  Supple, no jugular venous distention. No thyroid enlargement, no tenderness.  LUNGS: Normal breath sounds bilaterally, no wheezing, rales,rhonchi or crepitation. No use of accessory muscles of respiration.  CARDIOVASCULAR: S1, S2 normal. No murmurs, rubs, or gallops.  ABDOMEN: Soft, nontender, nondistended. Bowel sounds present. EXTREMITIES: s/p Right BKA. NEUROLOGIC: .  Awake, alert and oriented x3 gait not checked.  PSYCHIATRIC: The patient is alert and oriented  x 3.  SKIN: No obvious rash, lesion, or ulcer.   Physical Exam LABORATORY PANEL:   CBC Recent Labs  Lab 09/09/18 0552  WBC 20.1*  HGB 9.2*  HCT 30.2*  PLT 262   ------------------------------------------------------------------------------------------------------------------  Chemistries  Recent Labs  Lab 09/09/18 0552  NA 139  K 4.8  CL 94*  CO2 32  GLUCOSE 186*  BUN 44*  CREATININE 7.55*  CALCIUM 9.6  MG 2.3   ------------------------------------------------------------------------------------------------------------------  Cardiac Enzymes No results for input(s): TROPONINI in the last 168 hours. ------------------------------------------------------------------------------------------------------------------  RADIOLOGY:  No results found.  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  *End-stage renal disease on hemodialysis Nephrologist to help with continuing dialysis in the hospital. Continue supplemental medications as per nephrologist.  *Hyperkalemia; patient received emergency hemodialysis yesterday,k improved.  *Constipation MiraLAX, Colace and senna regimen  Dulcolax suppository as needed Daliresp is added for narcotic induced constipation Possible super infection in right leg,had fever,elevated wbc,started on zosyn,can possibly stop after 3 days All the records are reviewed and case discussed with Care Management/Social Workerr. Management plans discussed with the patient, family with the help of Spanish interpreter [place] and they are in agreement.  CODE STATUS: Full.  TOTAL  TIME TAKING CARE OF THIS PATIENT: 35 minutes.  More than 50% time spent in counseling, coordination of care. D/w help of Patent attorney.  POSSIBLE D/C  IN ? DAYS, DEPENDING ON CLINICAL CONDITION.   Epifanio Lesches M.D on 09/09/2018   Between 7am to 6pm - Pager - [phone]   After 6pm go to [email or web address] - password [email or web address] [facility]  Office  [phone]  CC: Primary care physician; [name], MD  Note: This dictation was prepared with Dragon dictation along with smaller phrase technology. Any transcriptional errors that result from this process are unintentional.

## 2018-09-10 ENCOUNTER — Encounter: Payer: Self-pay | Admitting: Vascular Surgery

## 2018-09-10 ENCOUNTER — Inpatient Hospital Stay (HOSPITAL_COMMUNITY)
Admit: 2018-09-10 | Discharge: 2018-09-10 | Disposition: A | Discharge status: Home / Self Care | Payer: Medicare Other | Attending: Internal Medicine | Admitting: Internal Medicine

## 2018-09-10 ENCOUNTER — Encounter
Admission: EM | Disposition: A | Discharge status: Skilled Nursing Facility | Payer: Self-pay | Source: Home / Self Care | Attending: Internal Medicine

## 2018-09-10 DIAGNOSIS — I4891 Unspecified atrial fibrillation: Secondary | ICD-10-CM

## 2018-09-10 DIAGNOSIS — I4729 Other ventricular tachycardia: Secondary | ICD-10-CM

## 2018-09-10 DIAGNOSIS — I248 Other forms of acute ischemic heart disease: Secondary | ICD-10-CM

## 2018-09-10 DIAGNOSIS — I472 Ventricular tachycardia: Secondary | ICD-10-CM

## 2018-09-10 DIAGNOSIS — N186 End stage renal disease: Secondary | ICD-10-CM

## 2018-09-10 DIAGNOSIS — Z992 Dependence on renal dialysis: Secondary | ICD-10-CM

## 2018-09-10 LAB — CBC WITH DIFFERENTIAL/PLATELET
ABS IMMATURE GRANULOCYTES: 0.14 10*3/uL — AB (ref 0.00–0.07)
Basophils Absolute: 0.1 10*3/uL (ref 0.0–0.1)
Basophils Relative: 0 %
Eosinophils Absolute: 0.1 10*3/uL (ref 0.0–0.5)
Eosinophils Relative: 0 %
HEMATOCRIT: 32.3 % — AB (ref 39.0–52.0)
HEMOGLOBIN: 9.7 g/dL — AB (ref 13.0–17.0)
IMMATURE GRANULOCYTES: 1 %
LYMPHS ABS: 0.8 10*3/uL (ref 0.7–4.0)
LYMPHS PCT: 6 %
MCH: 29.2 pg (ref 26.0–34.0)
MCHC: 30 g/dL (ref 30.0–36.0)
MCV: 97.3 fL (ref 80.0–100.0)
MONO ABS: 1.6 10*3/uL — AB (ref 0.1–1.0)
MONOS PCT: 12 %
NEUTROS ABS: 10.7 10*3/uL — AB (ref 1.7–7.7)
NEUTROS PCT: 81 %
PLATELETS: 295 10*3/uL (ref 150–400)
RBC: 3.32 MIL/uL — ABNORMAL LOW (ref 4.22–5.81)
RDW: 16.3 % — ABNORMAL HIGH (ref 11.5–15.5)
WBC: 13.3 10*3/uL — ABNORMAL HIGH (ref 4.0–10.5)
nRBC: 0.2 % (ref 0.0–0.2)

## 2018-09-10 LAB — HEPATIC FUNCTION PANEL
ALBUMIN: 2.8 g/dL — AB (ref 3.5–5.0)
ALK PHOS: 117 U/L (ref 38–126)
ALT: 10 U/L (ref 0–44)
AST: 22 U/L (ref 15–41)
BILIRUBIN TOTAL: 0.5 mg/dL (ref 0.3–1.2)
Bilirubin, Direct: <0.1 mg/dL (ref 0.0–0.2)
TOTAL PROTEIN: 6.5 g/dL (ref 6.5–8.1)

## 2018-09-10 LAB — LIPID PANEL
CHOLESTEROL: 137 mg/dL (ref 0–200)
HDL: 20 mg/dL — ABNORMAL LOW (ref >40)
LDL Cholesterol: 68 mg/dL (ref 0–99)
Total CHOL/HDL Ratio: 6.9 RATIO
Triglycerides: 244 mg/dL — ABNORMAL HIGH (ref <150)
VLDL: 49 mg/dL — ABNORMAL HIGH (ref 0–40)

## 2018-09-10 LAB — GLUCOSE, CAPILLARY
GLUCOSE-CAPILLARY: 187 mg/dL — AB (ref 70–99)
GLUCOSE-CAPILLARY: 234 mg/dL — AB (ref 70–99)
Glucose-Capillary: 204 mg/dL — ABNORMAL HIGH (ref 70–99)
Glucose-Capillary: 183 mg/dL — ABNORMAL HIGH (ref 70–99)

## 2018-09-10 LAB — CBC
HEMATOCRIT: 30.3 % — AB (ref 39.0–52.0)
Hemoglobin: 9.1 g/dL — ABNORMAL LOW (ref 13.0–17.0)
MCH: 29.2 pg (ref 26.0–34.0)
MCHC: 30 g/dL (ref 30.0–36.0)
MCV: 97.1 fL (ref 80.0–100.0)
Platelets: 302 10*3/uL (ref 150–400)
RBC: 3.12 MIL/uL — ABNORMAL LOW (ref 4.22–5.81)
RDW: 16.2 % — ABNORMAL HIGH (ref 11.5–15.5)
WBC: 18.1 10*3/uL — ABNORMAL HIGH (ref 4.0–10.5)
nRBC: 0.2 % (ref 0.0–0.2)

## 2018-09-10 LAB — PROTIME-INR
INR: 1.23
PROTHROMBIN TIME: 15.4 s — AB (ref 11.4–15.2)

## 2018-09-10 LAB — BASIC METABOLIC PANEL
ANION GAP: 17 — AB (ref 5–15)
BUN: 33 mg/dL — AB (ref 6–20)
CO2: 27 mmol/L (ref 22–32)
Calcium: 9.3 mg/dL (ref 8.9–10.3)
Chloride: 94 mmol/L — ABNORMAL LOW (ref 98–111)
Creatinine, Ser: 5.74 mg/dL — ABNORMAL HIGH (ref 0.61–1.24)
GFR calc Af Amer: 12 mL/min — ABNORMAL LOW (ref >60)
GFR calc non Af Amer: 10 mL/min — ABNORMAL LOW (ref >60)
GLUCOSE: 233 mg/dL — AB (ref 70–99)
POTASSIUM: 4.2 mmol/L (ref 3.5–5.1)
Sodium: 138 mmol/L (ref 135–145)

## 2018-09-10 LAB — PROCALCITONIN: Procalcitonin: 20.82 ng/mL

## 2018-09-10 LAB — PHOSPHORUS: Phosphorus: 8 mg/dL — ABNORMAL HIGH (ref 2.5–4.6)

## 2018-09-10 LAB — ECHOCARDIOGRAM COMPLETE
HEIGHTINCHES: 50 in
Weight: 3661.4 oz

## 2018-09-10 LAB — TROPONIN I
Troponin I: 0.05 ng/mL (ref <0.03)
Troponin I: 0.6 ng/mL (ref <0.03)
Troponin I: 1.44 ng/mL (ref <0.03)

## 2018-09-10 LAB — HEPARIN LEVEL (UNFRACTIONATED)

## 2018-09-10 LAB — TSH: TSH: 1.512 u[IU]/mL (ref 0.350–4.500)

## 2018-09-10 LAB — MAGNESIUM: Magnesium: 2.6 mg/dL — ABNORMAL HIGH (ref 1.7–2.4)

## 2018-09-10 SURGERY — AMPUTATION, ABOVE KNEE
Anesthesia: General | Laterality: Right

## 2018-09-10 MED ORDER — ALBUMIN HUMAN 25 % IV SOLN
25.0000 g | Freq: Once | INTRAVENOUS | Status: AC
  Administered 2018-09-10: 25 g via INTRAVENOUS
  Filled 2018-09-10: qty 100

## 2018-09-10 MED ORDER — AMIODARONE HCL IN DEXTROSE 360-4.14 MG/200ML-% IV SOLN
INTRAVENOUS | Status: AC
  Filled 2018-09-10: qty 200

## 2018-09-10 MED ORDER — DILTIAZEM HCL 25 MG/5ML IV SOLN
10.0000 mg | Freq: Once | INTRAVENOUS | Status: AC
  Administered 2018-09-10: 10 mg via INTRAVENOUS

## 2018-09-10 MED ORDER — SODIUM CHLORIDE 0.9 % IV BOLUS
250.0000 mL | Freq: Once | INTRAVENOUS | Status: AC
  Administered 2018-09-10: 250 mL via INTRAVENOUS

## 2018-09-10 MED ORDER — AMIODARONE HCL IN DEXTROSE 360-4.14 MG/200ML-% IV SOLN
60.0000 mg/h | INTRAVENOUS | Status: DC
  Administered 2018-09-10 (×2): 60 mg/h via INTRAVENOUS
  Filled 2018-09-10: qty 200

## 2018-09-10 MED ORDER — METOPROLOL TARTRATE 25 MG PO TABS
12.5000 mg | ORAL_TABLET | Freq: Four times a day (QID) | ORAL | Status: DC
  Administered 2018-09-10 – 2018-09-11 (×3): 12.5 mg via ORAL
  Filled 2018-09-10 (×3): qty 1

## 2018-09-10 MED ORDER — AMIODARONE LOAD VIA INFUSION
150.0000 mg | Freq: Once | INTRAVENOUS | Status: AC
  Administered 2018-09-10: 150 mg via INTRAVENOUS
  Filled 2018-09-10: qty 83.34

## 2018-09-10 MED ORDER — AMIODARONE HCL IN DEXTROSE 360-4.14 MG/200ML-% IV SOLN
30.0000 mg/h | INTRAVENOUS | Status: DC
  Administered 2018-09-11 – 2018-09-12 (×3): 30 mg/h via INTRAVENOUS
  Filled 2018-09-10 (×3): qty 200

## 2018-09-10 MED ORDER — AMIODARONE IV BOLUS ONLY 150 MG/100ML
INTRAVENOUS | Status: AC
  Administered 2018-09-10: 150 mg
  Filled 2018-09-10: qty 100

## 2018-09-10 MED ORDER — HEPARIN (PORCINE) 25000 UT/250ML-% IV SOLN
2250.0000 [IU]/h | INTRAVENOUS | Status: DC
  Administered 2018-09-10: 1200 [IU]/h via INTRAVENOUS
  Administered 2018-09-12: 2250 [IU]/h via INTRAVENOUS
  Administered 2018-09-12: 2050 [IU]/h via INTRAVENOUS
  Administered 2018-09-13: 2250 [IU]/h via INTRAVENOUS
  Filled 2018-09-10 (×5): qty 250

## 2018-09-10 NOTE — Progress Notes (Signed)
Pt with new onset atrial fibrillation with rvr s/p right AKA 09/09/18 Cardiology recommending heparin gtt. I spoke with Dr. Lucky Cowboy prior to initiation of heparin gtt he stated it was ok to start heparin gtt, however pt should NOT receive heparin boluses due to recent surgical procedure.  Marda Stalker, [place] Pager [phone] (please enter 7 digits) PCCM Consult Pager [phone] (please enter 7 digits)

## 2018-09-10 NOTE — Progress Notes (Signed)
RN responded to the rapid that was called on pt while in HD due to HR in 180-200s and diaphoretic, and low BP. When RN arrived rapid response team was already there. RN notified Dr Benjie Karvonen and she arrived to pt.'s bedside during rapid. RN transported with ICU nurse while transferring pt to ICU bed 7. Pt.'s wife called and notified of pt.'s condition. Interpreter was called and is at bedside. Bedside report was given to  ICU nurse [facility] RN. All pt.'s belongings were packet up and transported to ICU bed 7 without difficulty.   Jalyric Kaestner [employer]

## 2018-09-10 NOTE — Progress Notes (Signed)
   09/10/18 1100  Clinical Encounter Type  Visited With Patient  Visit Type Initial (Rapid Response)  Recommendations Follow-up as needed.  Spiritual Encounters  Spiritual Needs Emotional;Prayer  Stress Factors  Patient Stress Factors Health changes   Chaplain responded to a rapid response in Dialysis to assist the patient. Chaplain held space, prayed energetically, and provided pastoral presence. As the team prepared the patient for transport to ICU, Chaplain left to visit a grieving family in the ED.

## 2018-09-10 NOTE — Progress Notes (Signed)
Pre HD Assessment    09/10/18 0807  Neurological  Level of Consciousness Alert  Orientation Level Oriented X4  Respiratory  Respiratory Pattern Regular;Unlabored  Chest Assessment Chest expansion symmetrical  Cardiac  ECG Monitor Yes  Cardiac Rhythm NSR  Vascular  R Radial Pulse +2  L Radial Pulse +2  Edema Generalized  Integumentary  Integumentary (WDL) X  Skin Color Appropriate for ethnicity  Musculoskeletal  Musculoskeletal (WDL) X  Generalized Weakness Yes  Assistive Device None  GU Assessment  Genitourinary (WDL) X  Genitourinary Symptoms  (HD)  Psychosocial  Psychosocial (WDL) WDL  Needs Expressed Physical  Emotional support given Given to patient

## 2018-09-10 NOTE — Progress Notes (Signed)
Pre HD assessment    09/10/18 0806  Vital Signs  Temp 97.9 F (36.6 C)  Temp Source Oral  Pulse Rate 90  Pulse Rate Source Monitor  Resp 16  BP (!) 148/79  BP Location Right Arm  BP Method Automatic  Patient Position (if appropriate) Lying  Oxygen Therapy  O2 Device Nasal Cannula  O2 Flow Rate (L/min) 2 L/min  Pain Assessment  Pain Scale 0-10  Pain Score 0  Dialysis Weight  Weight 108.7 kg  Type of Weight Pre-Dialysis  Time-Out for Hemodialysis  What Procedure? HD  Pt Identifiers(min of two) First/Last Name;MRN/Account#  Correct Site? Yes  Correct Side? Yes  Correct Procedure? Yes  Consents Verified? Yes  Rad Studies Available? N/A  Safety Precautions Reviewed? Yes  Engineer, civil (consulting) Number  ([id number])  Station Number 1  UF/Alarm Test Passed  Conductivity: Meter 14  Conductivity: Machine  14  pH 7.6  Reverse Osmosis main  Normal Saline Lot Number '[id number]  Dialyzer Lot Number [id number]  Disposable Set Lot Number [id number]  Machine Temperature 98.6 F (37 C)  Musician and Audible Yes  Blood Lines Intact and Secured Yes  Pre Treatment Patient Checks  Vascular access used during treatment Fistula  Hepatitis B Surface Antigen Results Negative  Date Hepatitis B Surface Antigen Drawn 08/13/18  Hepatitis B Surface Antibody 270 (>10)  Date Hepatitis B Surface Antibody Drawn 08/13/18  Hemodialysis Consent Verified Yes  Hemodialysis Standing Orders Initiated Yes  ECG (Telemetry) Monitor On Yes  Prime Ordered Normal Saline  Length of  DialysisTreatment -hour(s) 3.5 Hour(s)  Dialyzer Elisio 17H NR  Dialysate 2K, 2.5 Ca  Dialysis Anticoagulant None  Dialysate Flow Ordered 800  Blood Flow Rate Ordered 400 mL/min  Ultrafiltration Goal 2 Liters  Dialysis Blood Pressure Support Ordered Normal Saline  Education / Care Plan  Dialysis Education Provided Yes  Documented Education in Care Plan Yes  Fistula / Graft Left Forearm Arteriovenous fistula  No Placement  Date or Time found.   Placed prior to admission: Yes  Orientation: Left  Access Location: Forearm  Access Type: Arteriovenous fistula  Site Condition No complications  Fistula / Graft Assessment Present;Thrill;Bruit  Drainage Description None

## 2018-09-10 NOTE — Progress Notes (Signed)
*  PRELIMINARY RESULTS* Echocardiogram 2D Echocardiogram has been performed.  Sherrie Sport 09/10/2018, 1:36 PM

## 2018-09-10 NOTE — Progress Notes (Signed)
[facility]  ROUNDING NOTE   Subjective:   Patient seen during dialysis   HEMODIALYSIS FLOWSHEET:  Blood Flow Rate (mL/min): 400 mL/min Arterial Pressure (mmHg): -210 mmHg Venous Pressure (mmHg): 160 mmHg Transmembrane Pressure (mmHg): 60 mmHg Ultrafiltration Rate (mL/min): 480 mL/min Dialysate Flow Rate (mL/min): 800 ml/min Conductivity: Machine : 13.9 Conductivity: Machine : 13.9 Dialysis Fluid Bolus: Normal Saline Bolus Amount (mL): 250 mL  Patient dropped his blood pressure earlier in the treatment to 1 teens.  Ultrafiltration goal was lowered at first and then turned out.  Later in the treatment, he developed arrhythmia and tachycardia.  Rapid response was called  Objective:  Vital signs in last 24 hours:  Temp:  [97.2 F (36.2 C)-99.4 F (37.4 C)] 98.3 F (36.8 C) (11/27 1158) Pulse Rate:  [83-181] 113 (11/27 1200) Resp:  [7-23] 12 (11/27 1200) BP: (90-148)/(43-138) 93/66 (11/27 1200) SpO2:  [86 %-100 %] 97 % (11/27 1200) Weight:  [103.8 kg-108.7 kg] 103.8 kg (11/27 1158)  Weight change:  Filed Weights   09/10/18 0806 09/10/18 1120 09/10/18 1158  Weight: 108.7 kg 105.7 kg 103.8 kg    Intake/Output: I/O last 3 completed shifts: In: 299.3 [P.O.:120; I.V.:102.2; IV Piggyback:77.1] Out: 425 [Urine:400; Blood:25]   Intake/Output this shift:  Total I/O In: 42.2 [IV Piggyback:42.2] Out: 650 [Other:650]  Physical Exam: General: NAD,   Head: Normocephalic, atraumatic. Moist oral mucosal membranes  Eyes: Anicteric,   Neck: Supple,   Lungs:  Clear to auscultation  Heart:  Irregular  Abdomen:  Soft, nontender,   Extremities:  left BKA  Neurologic: Nonfocal, able to answer questions  Skin: No lesions  Access: AVF    Basic Metabolic Panel: Recent Labs  Lab 09/03/18 1600 09/04/18 0146 09/05/18 1202 09/08/18 0433 09/08/18 1405 09/09/18 0552  NA 136 137 135 133*  --  139  K 5.7* 5.3* 5.8* 6.0* 4.9 4.8  CL 93* 96* 96* 91*  --  94*  CO2 28  29 28 26   --  32  GLUCOSE 228* 168* 183* 199*  --  186*  BUN 25* 35* 45* 56*  --  44*  CREATININE 5.27* 6.26* 7.45* 9.61*  --  7.55*  CALCIUM 9.1 9.1 8.7* 8.9  --  9.6  MG  --   --   --  2.5*  --  2.3  PHOS  --   --  5.6*  --   --   --     Liver Function Tests: Recent Labs  Lab 09/05/18 1202  ALBUMIN 3.5   No results for input(s): LIPASE, AMYLASE in the last 168 hours. No results for input(s): AMMONIA in the last 168 hours.  CBC: Recent Labs  Lab 09/03/18 1600  09/06/18 0520 09/07/18 0609 09/08/18 0433 09/09/18 0552 09/10/18 0413  WBC 13.4*   < > 12.8* 14.2* 21.5* 20.1* 18.1*  NEUTROABS 10.5*  --   --   --   --   --   --   HGB 13.2   < > 10.0* 9.9* 9.7* 9.2* 9.1*  HCT 43.0   < > 33.6* 32.4* 31.6* 30.2* 30.3*  MCV 97.3   < > 98.2 96.7 96.6 97.1 97.1  PLT 309   < > 254 259 264 262 302   < > = values in this interval not displayed.    Cardiac Enzymes: No results for input(s): CKTOTAL, CKMB, CKMBINDEX, TROPONINI in the last 168 hours.  BNP: Invalid input(s): POCBNP  CBG: Recent Labs  Lab 09/09/18 1719 09/09/18 1822  09/09/18 2133 09/10/18 0732 09/10/18 1134  GLUCAP 199* 196* 188* 204* 187*    Microbiology: Results for orders placed or performed during the hospital encounter of 08/12/18  MRSA PCR Screening     Status: None   Collection Time: 08/13/18  9:08 AM  Result Value Ref Range Status   MRSA by PCR NEGATIVE NEGATIVE Final    Comment:        The GeneXpert MRSA Assay (FDA approved for NASAL specimens only), is one component of a comprehensive MRSA colonization surveillance program. It is not intended to diagnose MRSA infection nor to guide or monitor treatment for MRSA infections. Performed at [facility], [address] [place], [place] [address]     Coagulation Studies: Recent Labs    09/08/18 0433 09/09/18 0552  LABPROT 15.3* 16.2*  INR 1.22 1.32    Urinalysis: No results for input(s): COLORURINE, LABSPEC, PHURINE,  GLUCOSEU, HGBUR, BILIRUBINUR, KETONESUR, PROTEINUR, UROBILINOGEN, NITRITE, LEUKOCYTESUR in the last 72 hours.  Invalid input(s): APPERANCEUR    Imaging: No results found.   Medications:   . sodium chloride Stopped (09/10/18 0232)  . amiodarone 60 mg/hr (09/10/18 1154)   Followed by  . amiodarone    . amiodarone    . small volume/piggyback builder Stopped (09/10/18 0700)  . piperacillin-tazobactam (ZOSYN)  IV Stopped (09/10/18 0641)   . amiodarone  150 mg Intravenous Once  . aspirin EC  81 mg Oral Daily  . cinacalcet  60 mg Oral Once per day on Mon Wed Fri  . epoetin (EPOGEN/PROCRIT) injection  4,000 Units Intravenous Q M,W,F-HD  . feeding supplement (NEPRO CARB STEADY)  237 mL Oral BID BM  . fentaNYL  25 mcg Intravenous Once  . ferrous sulfate  325 mg Oral TID WC  . insulin aspart  0-9 Units Subcutaneous TID WC  . midodrine  5 mg Oral TID WC  . multivitamin  1 tablet Oral QHS  . multivitamin-lutein  1 capsule Oral Daily  . pantoprazole  40 mg Oral BID  . polyethylene glycol  17 g Oral BID  . senna-docusate  1 tablet Oral BID  . sevelamer carbonate  2,400 mg Oral TID WC  . sodium zirconium cyclosilicate  10 g Oral Daily  . vitamin C  250 mg Oral BID   sodium chloride, acetaminophen, docusate sodium, HYDROmorphone (DILAUDID) injection **OR** HYDROmorphone (DILAUDID) injection, lidocaine-prilocaine, ondansetron (ZOFRAN) IV, oxyCODONE  Assessment/ Plan:  [name] is a 57 y.o. Hispanic male with end stage renal disease, peripheral vascular disease, hypertension, CVA, hyperlipidemia, diabetes mellitus type II who is admitted to [facility] 113 kg Left AVF  1. End Stage Renal Disease:  MWF schedule. With hyperkalemia Next HD will be scheduled for Friday Midodrine before treatment.  Develop rapid ventricular rate toward the end of treatment today.  Rapid response team was called.  Transferred to ICU for further evaluation and monitoring   2.  Anemia of chronic kidney disease: hemoglobin 9.1 - low dose EPO to be scheduled with HD  3. Hypotension - Patient takes midodrine before his dialysis treatments  4. Secondary Hyperparathyroidism:  - sevelamer and cinacalcet with meals.   5. Peripheral arterial disease: Underwent right above-the-knee amputation November 26 by Dr. Lucky Cowboy  6. Diabetes mellitus type II with chronic kidney disease: Continue glucose control.    LOS: 7 Alberto Schoch 11/27/201912:18 PM

## 2018-09-10 NOTE — Significant Event (Signed)
Patient was in dialysis unit and his heart rate changed into afib with RVR in the 180's-200's- EKG ordered.  Patient had finished dialysis and Melissa, RN (dialysis RN) said that she pulled off between 500 and 692ml.  Dr. Benjie Karvonen at bedside and order for 10mg  of cardizem to be given in ICU- patient then transferred to ICU- cardizem given- amiodarone bolus given and amiodarone drip infusing at this time.  Patient HR in 140's-to 160's afib and patient resting at this time.

## 2018-09-10 NOTE — Progress Notes (Signed)
Post HD assessment    09/10/18 1129  Neurological  Level of Consciousness Alert  Orientation Level Oriented X4  Respiratory  Respiratory Pattern Regular;Unlabored  Cardiac  Pulse Irregular  Cardiac Rhythm VT;Atrial fibrillation;Other (Comment) (rapid A-fib)  Vascular  R Radial Pulse +2  L Radial Pulse +2  Edema Generalized  Integumentary  Integumentary (WDL) X  Skin Color Appropriate for ethnicity  Musculoskeletal  Musculoskeletal (WDL) X  Generalized Weakness Yes  Assistive Device None  GU Assessment  Genitourinary (WDL) X  Genitourinary Symptoms  (HD)  Psychosocial  Psychosocial (WDL) WDL  Emotional support given Given to patient

## 2018-09-10 NOTE — Progress Notes (Signed)
PT Cancellation Note  Patient Details Name: [name] MRN: [id number] DOB: July 30, 1961   Cancelled Treatment:    Reason Eval/Treat Not Completed: Patient at procedure or test/unavailable.  Pt currently off floor at dialysis.  Will re-attempt PT evaluation at a later date/time.  Leitha Bleak, PT 09/10/18, 8:08 AM [phone]

## 2018-09-10 NOTE — Progress Notes (Signed)
[facility] [place]    MR#:  [id number]  DATE OF BIRTH:  [birth year]  SUBJECTIVE:   rapid response called while in HD A fib RVR Patient denies CP, SOB. palpitations  REVIEW OF SYSTEMS:    Review of Systems  Constitutional: Negative for fever, chills weight loss HENT: Negative for ear pain, nosebleeds, congestion, facial swelling, rhinorrhea, neck pain, neck stiffness and ear discharge.   Respiratory: Negative for cough, shortness of breath, wheezing  Cardiovascular: Negative for chest pain, palpitations and leg swelling.  Gastrointestinal: Negative for heartburn, abdominal pain, vomiting, diarrhea or consitpation Genitourinary: Negative for dysuria, urgency, frequency, hematuria Musculoskeletal: Negative for back pain or joint pain Neurological: Negative for dizziness, seizures, syncope, focal weakness,  numbness and headaches.  Hematological: Does not bruise/bleed easily.  Psychiatric/Behavioral: Negative for hallucinations, confusion, dysphoric mood    Tolerating Diet: yes      DRUG ALLERGIES:  No Known Allergies  VITALS:  Blood pressure 92/70, pulse (!) 177, temperature (!) 97.5 F (36.4 C), temperature source Oral, resp. rate (!) 23, height 5\' 10"  (1.778 m), weight 105.7 kg, SpO2 99 %.  PHYSICAL EXAMINATION:  Constitutional: Bees. No distress. HENT: Normocephalic. Marland Kitchen Oropharynx is clear and moist.  Eyes: Conjunctivae and EOM are normal. PERRLA, no scleral icterus.  Neck: Normal ROM. Neck supple. No JVD. No tracheal deviation. CVS: Tachycardia, irregular, irregular no murmurs, no gallops, no carotid bruit.  Pulmonary: Effort and breath sounds normal, no stridor, rhonchi, wheezes, rales.  Abdominal: Soft. BS +,  no distension, tenderness, rebound or guarding.  Musculoskeletal: right AKA dressed. Left BKA Neuro: Alert. CN 2-12 grossly intact. No focal deficits. Skin: Skin is warm and dry. No rash  noted. Psychiatric: Normal mood and affect.      LABORATORY PANEL:   CBC Recent Labs  Lab 09/10/18 0413  WBC 18.1*  HGB 9.1*  HCT 30.3*  PLT 302   ------------------------------------------------------------------------------------------------------------------  Chemistries  Recent Labs  Lab 09/09/18 0552  NA 139  K 4.8  CL 94*  CO2 32  GLUCOSE 186*  BUN 44*  CREATININE 7.55*  CALCIUM 9.6  MG 2.3   ------------------------------------------------------------------------------------------------------------------  Cardiac Enzymes No results for input(s): TROPONINI in the last 168 hours. ------------------------------------------------------------------------------------------------------------------  RADIOLOGY:  No results found.   ASSESSMENT AND PLAN:   57 year old male with end-stage renal disease on dialysis presented with early ischemic changes in the right lower extremity.   1.  Irreversible ischemia of the right leg with gangrene: Patient is postoperative day #1 Management as per vascular surgery Continue Zosyn for now 2.  Atrial fibrillation with RVR: Rapid response called this morning Case discussed with Dr. Ubaldo Glassing Plan to transfer to stepdown unit on amiodarone drip due to low blood pressure Follow-up on echocardiogram and TSH Follow-up and recommendations by cardiology   3.  End-stage renal disease on hemodialysis: Management as per nephrology  4.  Chronic systolic and diastolic heart failure status post cardiac catheterization in 2017 showing  Ost LM to LM lesion, 10% stenosed.  Ost RCA to Prox RCA lesion, 100% stenosed.  There is moderate left ventricular systolic dysfunction.  Patient did not have follow-up after this cardiology Continue aspirin Due to low blood pressure he is not on hypertensive medications.   Management plans discussed with the patient and he is in agreement.  CODE STATUS: full  Critical care due to rapid  response TOTAL TIME TAKING CARE OF THIS PATIENT: 35 minutes.  POSSIBLE D/C 3-5 days, DEPENDING ON CLINICAL CONDITION.   Haeley Fordham M.D on 09/10/2018 at 11:48 AM  Between 7am to 6pm - Pager - [phone] After 6pm go to [email or web address] - password [email or web address] [facility]  Office  [phone]  CC: Primary care physician; [name], MD  Note: This dictation was prepared with Dragon dictation along with smaller phrase technology. Any transcriptional errors that result from this process are unintentional.

## 2018-09-10 NOTE — Consult Note (Addendum)
Name: [name] MRN: [id number] DOB: [birth year]    ADMISSION DATE:  09/03/2018 CONSULTATION DATE: 09/10/2018  REFERRING MD : Dr. Benjie Karvonen   CHIEF COMPLAINT: Elevated Heart Rate   BRIEF PATIENT DESCRIPTION:  57 yo male admitted 11/20 with ischemic and gangrenous RLE s/p angiogram on 11/21 required RLE AKA on 11/26 transferred to the stepdown unit 11/27 with atrial fibrillation with rvr and hypotension requiring amiodarone gtt  SIGNIFICANT EVENTS  11/20-Pt admitted to [facility] unit  11/21-Pt underwent angiogram of RLE for possible limb salvage 11/26-Pt underwent right AKA  11/27-Rapid response called during HD session cardiac rhythm atrial fibrillation with rvr hr 180's and hypotensive requiring transfer to the stepdown unt  HISTORY OF PRESENT ILLNESS:   This is a 57 yo male with a PMH of Stroke, Hypotension, HTN, Left AKA, Cardiac Arrest, Chronic Diastolic CHF, Hypercholesteremia, Type II Diabetes Mellitus, ESRD on HD, Atherosclerosis, and Anemia of Chronic Kidney Disease.  He presented to [facility] ER on 11/20 with c/o worsening pain, and cold/blue right lower extremity. He has a long standing hx of atherosclerotic occlusive disease particularly of the right lower extremity that has been associated with open nonhealing wounds.  He has undergone several attempts at revascularization without improvement of his arterial perfusion.  Vascular surgery consulted on admission recommending RLE AKA, however pt and family requested angiogram for possible limb salvage.  On 11/21 he underwent angiogram, however unable to salvage limb due to irreversible ischemia.  He underwent right AKA on 11/26. On 11/27 pts cardiac rhythm atrial fibrillation with rvr hr 180's and hypotensive rapid response called pt transferred to the stepdown unit requiring amiodarone gtt.   PAST MEDICAL HISTORY :   has a past medical history of Anemia, Atherosclerosis, Chronic kidney disease, Diabetes mellitus without complication ([place]),  Hypercholesteremia, Hypertension, Hypotension, and Stroke ([place]).  has a past surgical history that includes AV fistula placement (Left, 2015); Cardiac catheterization (N/A, 10/03/2015); Cardiac catheterization (N/A, 01/16/2016); Cardiac catheterization (01/16/2016); Leg amputation below knee (Left); Wound debridement (Right, 04/25/2016); Application if wound vac (Right, 04/25/2016); Amputation (Right, 04/25/2016); Cardiac catheterization (Right, 04/27/2016); Cardiac catheterization (04/27/2016); Cardiac catheterization (N/A, 04/30/2016); Transmetatarsal amputation (Right, 04/28/2016); A/V Fistulagram (Left, 01/09/2017); Cataract extraction, bilateral; A/V Fistulagram (Left, 06/06/2017); A/V SHUNT INTERVENTION (N/A, 06/06/2017); A/V Fistulagram (Left, 08/15/2017); A/V Fistulagram (Left, 01/22/2018); Lower Extremity Angiography (Right, 07/24/2018); Lower Extremity Angiography (Right, 08/13/2018); Lower Extremity Angiography (Right, 09/04/2018); and Amputation (Right, 09/09/2018). Prior to Admission medications   Medication Sig Start Date End Date Taking? Authorizing Provider  aspirin EC 81 MG tablet Take 81 mg by mouth daily.   Yes [provider]  b complex-vitamin c-folic acid (NEPHRO-VITE) 0.8 MG TABS tablet Take 1 tablet by mouth daily.   Yes [provider]  cinacalcet (SENSIPAR) 60 MG tablet Take 60 mg by mouth daily.    Yes [provider]  clopidogrel (PLAVIX) 75 MG tablet Take 1 tablet (75 mg total) by mouth daily. 07/24/18  Yes [name], Erskine Squibb, MD  ferrous sulfate 325 (65 FE) MG EC tablet Take 325 mg by mouth 3 (three) times daily with meals.   Yes [provider]  glipiZIDE (GLUCOTROL) 10 MG tablet Take 10 mg by mouth daily.  10/25/16  Yes [provider]  lidocaine-prilocaine (EMLA) cream Apply 1 application topically as needed (port access).   Yes [provider]  omeprazole (PRILOSEC) 40 MG capsule Take 40 mg by mouth 2 (two) times daily.    Yes [provider]  polyethylene glycol (MIRALAX /  GLYCOLAX) packet Take 17 g by mouth 2 (two) times daily.    Yes [provider]  sevelamer carbonate (RENVELA) 800 MG tablet Take 2,400 mg by mouth 3 (three) times daily with meals.    Yes [provider]  traMADol (ULTRAM) 50 MG tablet Take 1 tablet (50 mg total) by mouth every 6 (six) hours as needed. Patient taking differently: Take 50 mg by mouth every 6 (six) hours as needed for moderate pain or severe pain.  08/14/18 08/14/19 Yes [name], MD   No Known Allergies  FAMILY HISTORY:  family history includes Diabetes in his father; Prostate cancer in his father. SOCIAL HISTORY:  reports that he has never smoked. He has never used smokeless tobacco. He reports that he does not drink alcohol or use drugs.  REVIEW OF SYSTEMS: Positives in BOLD  Constitutional: Negative for fever, chills, weight loss, malaise/fatigue and diaphoresis.  HENT: Negative for hearing loss, ear pain, nosebleeds, congestion, sore throat, neck pain, tinnitus and ear discharge.   Eyes: Negative for blurred vision, double vision, photophobia, pain, discharge and redness.  Respiratory: cough, hemoptysis, sputum production, shortness of breath, wheezing and stridor.   Cardiovascular: Negative for chest pain, palpitations, orthopnea, claudication, leg swelling and PND.  Gastrointestinal: Negative for heartburn, nausea, vomiting, abdominal pain, diarrhea, constipation, blood in stool and melena.  Genitourinary: Negative for dysuria, urgency, frequency, hematuria and flank pain.  Musculoskeletal: Negative for myalgias, back pain, joint pain and falls.  Skin: Negative for itching and rash.  Neurological: Negative for dizziness, tingling, tremors, sensory change, speech change, focal weakness, seizures, loss of consciousness, weakness and headaches.  Endo/Heme/Allergies: Negative for environmental allergies and polydipsia. Does not bruise/bleed  easily.  SUBJECTIVE:  No complaints at this time  VITAL SIGNS: Temp:  [97.2 F (36.2 C)-99.4 F (37.4 C)] 97.5 F (36.4 C) (11/27 1120) Pulse Rate:  [71-181] 177 (11/27 1120) Resp:  [7-23] 23 (11/27 1120) BP: (90-148)/(43-98) 92/70 (11/27 1120) SpO2:  [86 %-100 %] 99 % (11/27 1120) Weight:  [108.7 kg] 108.7 kg (11/27 0806)  PHYSICAL EXAMINATION: General: well developed, well nourished male, NAD  Neuro: alert and oriented, follows commands  HEENT: supple, no JVD  Cardiovascular: irregular irregular, no R/G  Lungs: diminished with faint crackles bilateral bases, even, non labored  Abdomen: +BS x4, obese, soft, non tender, non distended  Musculoskeletal: right AKA, left BKA Skin: right AKA incision site unable to visualize dressing dry and intact   Recent Labs  Lab 09/05/18 1202 09/08/18 0433 09/08/18 1405 09/09/18 0552  NA 135 133*  --  139  K 5.8* 6.0* 4.9 4.8  CL 96* 91*  --  94*  CO2 28 26  --  32  BUN 45* 56*  --  44*  CREATININE 7.45* 9.61*  --  7.55*  GLUCOSE 183* 199*  --  186*   Recent Labs  Lab 09/08/18 0433 09/09/18 0552 09/10/18 0413  HGB 9.7* 9.2* 9.1*  HCT 31.6* 30.2* 30.3*  WBC 21.5* 20.1* 18.1*  PLT 264 262 302   No results found.  ASSESSMENT / PLAN:  New onset atrial fibrillation with rvr  Hx: Stroke, Hypotension, HTN, Hypercholesteremia  Continuous telemetry monitoring Trend troponin's  Continue amiodarone gtt  Continue outpatient aspirin and midodrine  Echo pending  Cardiology consulted appreciate input   RLE irreversible ischemia s/p AKA 11/26 Continue iv zosyn  Trend WBC and monitor fever curve  Prn dilaudid and oxycodone for pain management   ESRD on HD  Trend BMP  Replace electrolytes as indicated  Avoid nephrotoxic medications Nephrology consulted appreciate input-HD per recommendations  Anemia of chronic kidney disease and possible postop anemia  Trend CBC  Monitor for s/sx of bleeding and transfuse for hgb <7   Type  II Diabetes Mellitus  SSI  CBG's ac/hs   Marda Stalker, [place] Pager [phone] (please enter 7 digits) PCCM Consult Pager [phone] (please enter 7 digits)   I agree with the documented

## 2018-09-10 NOTE — Progress Notes (Signed)
[place] for heparin drip management  Indication: atrial fibrillation   No Known Allergies  Patient Measurements: Height: 4\' 2"  (127 cm) Weight: 228 lb 13.4 oz (103.8 kg) IBW/kg (Calculated) : 27 Heparin Dosing Weight: 89 kg   Vital Signs: Temp: 98.8 F (37.1 C) (11/27 1400) Temp Source: Axillary (11/27 1400) BP: 118/108 (11/27 1400) Pulse Rate: 104 (11/27 1400)  Labs: Recent Labs    09/07/18 2052  09/08/18 0433 09/09/18 0552 09/10/18 0413 09/10/18 1256  HGB  --    < > 9.7* 9.2* 9.1* 9.7*  HCT  --    < > 31.6* 30.2* 30.3* 32.3*  PLT  --    < > 264 262 302 295  APTT  --   --   --  38*  --   --   LABPROT  --   --  15.3* 16.2*  --   --   INR  --   --  1.22 1.32  --   --   HEPARINUNFRC 0.37  --  0.42  --   --   --   CREATININE  --   --  9.61* 7.55*  --  5.74*  TROPONINI  --   --   --   --   --  0.05*   < > = values in this interval not displayed.    Estimated Creatinine Clearance: 11.4 mL/min (A) (by C-G formula based on SCr of 5.74 mg/dL (H)).   Medical History: Past Medical History:  Diagnosis Date  . Anemia   . Atherosclerosis   . Chronic kidney disease   . Diabetes mellitus without complication ([place])   . Hypercholesteremia   . Hypertension   . Hypotension   . Stroke [facility])     Medications:  Scheduled:  . amiodarone  150 mg Intravenous Once  . aspirin EC  81 mg Oral Daily  . cinacalcet  60 mg Oral Once per day on Mon Wed Fri  . epoetin (EPOGEN/PROCRIT) injection  4,000 Units Intravenous Q M,W,F-HD  . feeding supplement (NEPRO CARB STEADY)  237 mL Oral BID BM  . fentaNYL  25 mcg Intravenous Once  . ferrous sulfate  325 mg Oral TID WC  . insulin aspart  0-9 Units Subcutaneous TID WC  . metoprolol tartrate  12.5 mg Oral Q6H  . midodrine  5 mg Oral TID WC  . multivitamin  1 tablet Oral QHS  . multivitamin-lutein  1 capsule Oral Daily  . pantoprazole  40 mg Oral BID  . polyethylene glycol  17 g Oral BID  .  senna-docusate  1 tablet Oral BID  . sevelamer carbonate  2,400 mg Oral TID WC  . sodium zirconium cyclosilicate  10 g Oral Daily  . vitamin C  250 mg Oral BID   Infusions:  . sodium chloride Stopped (09/10/18 0232)  . amiodarone 60 mg/hr (09/10/18 1154)   Followed by  . amiodarone    . small volume/piggyback builder Stopped (09/10/18 0700)  . heparin    . piperacillin-tazobactam (ZOSYN)  IV Stopped (09/10/18 0641)  . sodium chloride      Assessment: Pharmacy consulted for heparin drip management for 57 yo male HD patient admitted to ICU for atrial fibrillation with RVR. Patient s/p right AKA on 11/26. Patient previously ordered heparin drip for limb ischemia prior to AKA with last rate 2550 units/hr. Patient with previous left BKA. Per CCM/Vascular surgery patient is to receive no heparin boluses.  Goal of Therapy:  Heparin level 0.3-0.7 units/ml Monitor platelets by anticoagulation protocol: Yes   Plan:  Will initiate heparin at 1200 units/hr with no bolus. Will obtain initial heparin level at 2300. Patient is to receive no heparin boluses. Dosing/Plan discussed with CCM.   Will obtain CBC with am labs.   Pharmacy will continue to monitor and adjust per consult.   Simpson,Michael L 09/10/2018,2:49 PM

## 2018-09-10 NOTE — Progress Notes (Signed)
Post HD assessment. Pt did not tolerated tx well. Pt c/o surgical site pain and experienced sustained v-tach, along with rapid a-fib,MD aware. A rapid response was called and pt transferred to ICU. Net UF 650, goal not met.    09/10/18 1120  Vital Signs  Temp (!) 97.5 F (36.4 C)  Temp Source Oral  Pulse Rate (!) 177  Pulse Rate Source Monitor  Resp (!) 23  BP 92/70  BP Location Right Arm  BP Method Automatic  Patient Position (if appropriate) Lying  Oxygen Therapy  SpO2 99 %  O2 Device Nasal Cannula  O2 Flow Rate (L/min) 2 L/min  Dialysis Weight  Weight 105.7 kg  Type of Weight Post-Dialysis  Post-Hemodialysis Assessment  Rinseback Volume (mL) 250 mL  KECN 59.7 V  Dialyzer Clearance Lightly streaked  Duration of HD Treatment -hour(s) 2.9 hour(s)  Hemodialysis Intake (mL) 500 mL  UF Total -Machine (mL) 1150 mL  Net UF (mL) 650 mL  Tolerated HD Treatment Yes  AVG/AVF Arterial Site Held (minutes) 10 minutes  AVG/AVF Venous Site Held (minutes) 10 minutes  Education / Care Plan  Dialysis Education Provided Yes  Documented Education in Care Plan Yes  Fistula / Graft Left Forearm Arteriovenous fistula  No Placement Date or Time found.   Placed prior to admission: Yes  Orientation: Left  Access Location: Forearm  Access Type: Arteriovenous fistula  Site Condition No complications  Fistula / Graft Assessment Present;Thrill;Bruit  Status Deaccessed  Drainage Description None   

## 2018-09-10 NOTE — Progress Notes (Signed)
Hd tx start    09/10/18 0815  Vital Signs  Pulse Rate 89  Pulse Rate Source Monitor  Resp 14  BP 137/77  BP Location Right Arm  BP Method Automatic  Patient Position (if appropriate) Lying  Oxygen Therapy  SpO2 98 %  O2 Device Nasal Cannula  O2 Flow Rate (L/min) 2 L/min  During Hemodialysis Assessment  Blood Flow Rate (mL/min) 400 mL/min  Arterial Pressure (mmHg) -200 mmHg  Venous Pressure (mmHg) 160 mmHg  Transmembrane Pressure (mmHg) 60 mmHg  Ultrafiltration Rate (mL/min) 860 mL/min  Dialysate Flow Rate (mL/min) 800 ml/min  Conductivity: Machine  14  HD Safety Checks Performed Yes  Dialysis Fluid Bolus Normal Saline  Bolus Amount (mL) 250 mL  Intra-Hemodialysis Comments Tx initiated  Fistula / Graft Left Forearm Arteriovenous fistula  No Placement Date or Time found.   Placed prior to admission: Yes  Orientation: Left  Access Location: Forearm  Access Type: Arteriovenous fistula  Status Accessed  Needle Size 15

## 2018-09-10 NOTE — Progress Notes (Signed)
PT Cancellation Note  Patient Details Name: [name] MRN: [id number] DOB: [birth year]   Cancelled Treatment:    Reason Eval/Treat Not Completed: Medical issues which prohibited therapy(Consult received and chart reviewed.  Patient noted with recent transfer to higher-level of care due to acute onset of afib with RVR, requiring management in CCU.  Per guidelines, will require new orders to resume PT services.  Will complete initial order at this time; please re-consult as medically appropriate.)   Shanayah Kaffenberger H. Owens Shark, PT, DPT, NCS 09/10/18, 4:08 PM [phone]

## 2018-09-10 NOTE — Consult Note (Signed)
Cardiology Consultation:   Patient ID: [name] MRN: [id number]; DOB: Oct 10, 1961  Admit date: 09/03/2018 Date of Consult: 09/10/2018  Primary Care Provider: [name], MD Primary Cardiologist: Dr. Fletcher Anon (2017) Primary Electrophysiologist:  None    Patient Profile:   [name] is a 57 y.o. male with a hx of 1v CAD (2017 LHC), HFrEF (EF 30-35%, 2017 LHC), h/o polymorphic VT & cardiac arrest 2017, CVA, ESRD on HD since 2008, HTN with labile pressures, DM2, HLD, PAD with L BKA / R AKA, stroke, anemia of CKD  who is being seen today for the evaluation of new onset Afib with RVR on amiodarone and s/p R AKA with HD at the request of Marda Stalker, NP /  Dr. Benjie Karvonen.  History of Present Illness:   [name] is a 57 yo male with complex PMH as above.   04/2016: Admitted to [facility] 04/25/2016 for elective surgery with R toe amputation planned and excisional debridement of necrotic tissue / gangrene 2/2 atherosclerotic occlusive disease of the bilateral lower extremities.  He then had an angiogram with angioplasty and developed polymorphic ventricular tachycardia which required 1 shock to restore sinus rhythm and CPR for 2 minutes. Repeat EKG showed SR with borderline ST depression in the inferior leads but no significant STE. Echocardiogram showed EF of 40-45%. It was suspected that the most likely etiology of the ventricular tachycardia was an ischemic cardiac event given his history of atherosclerosis and multiple risk factors.  He was started on ASA, statin, low-dose metoprolol with plan for cardiac catheterization.  On 04/30/2016, he underwent cardiac catheterization with LHC and coronary angiography and found to have severe 1v CAD with chronically occluded proximal RCA with L-R collateral, moderate LV systolic dysfunction given EF 30-35%, global hypokinesis more pronounced in the basal inferior wall, and moderately elevated LVEDP. Recommendations were to continue medical management,  monitor BP during HD, and for a holter d/t PVCs as well as consideration for EP evaluation to see if ICD candidate.   08/13/2018: He underwent an attempt for RLE arterial revascularization via balloon angioplasty and thrombectomy given the progression of his atherosclerotic occlusive disease with non-healing RLE ulceration and continued poor arterial perfusion. Per patient, following this procedure, the patient's RLE circulation initially improved with more warmth and less pain in the leg; however, on Monday 11/18, he noted his R foot and leg were again cold to the touch with blue/black discoloration after dialysis that same day. Patient stated he waited to come to the hospital for fear that he would need his leg amputated but ultimately decided to come in for further evaluation d/t R leg pain.  He presented to the ED 09/03/2018 with R foot and leg pain concerning for ischemic lower right extremity. On physical exam, the patient's RLE was documented as pulseless, cold, and mottled in color from proximal calf to foot with a black, gangrenous area ~2-3cm width on the posterior R calf. The case was discussed with Dr. Nino Parsley APP with plan for further evaluation via vascular service. He was then placed on heparin and admitted for further evaluation and R AKA performed 09/09/2018. On 11/27, rapid response was called d/t new onset Afib with RVR while in HD. Patient denied CP, SOB, and palpitations at that time.  During today's exam, the patient c/o "feelings of [place] (fear)," throughout the exam. He stated that he was most concerned about his leg and HD but was not very worried about his heart. He was observed as somewhat somnolent  throughout the exam, which the patient's wife stated was likely d/t the pain medication he had just received. On exam, he denied current CP and palpitations but did report fatigue, fear/anxiety. He also reported feeling rapid heart rate; however, on further questioning, this was  clarified as more awareness that he had elevated HR given the monitors and MD reports. When asked if he had ever been in Afib in the past, the patient's wife recalled the patient's complex PMH as above and reported hypotension  and tachycardia during HD. She did not seem to be aware of an irregular heart rate but frequently reported her concern about the tachycardic rates and hypotension. She also recalled the 2017 cardiac event as documented above.   Most recent vitals and labs today 11/27 as below show continued Afib with elevated / uncontrolled ventricular rates into the 190s while on amiodarone, labile BP with hypotension on exam, intermittent hypoxia (dependent on [name] O2), and are consistent with renal failure on HD:  Telemetry: Afib with ventricular rates into the high 190s and NSVT as well as PVCs. Vitals: HR 83-198. SBP 90-148. DBP 43-238. SpO2 86-100% Labs: Na 138, K 4.2, glucose 233, Cr 5.74, WBC 13.3, RBC 3.32, Hgb 9.7, plts 295,  Troponin minimally elevated 0.05 EKG: 11/27 EKG with Atrial fibrillation with RVR and ventricular rate 178bpm, IVCD, PVCs, QTc 513, ST changes V2-V4 concerning for anterolateral ischemia CXR: Not performed   Past Medical History:  Diagnosis Date  . Anemia   . Atherosclerosis   . Chronic kidney disease   . Diabetes mellitus without complication ([place])   . Hypercholesteremia   . Hypertension   . Hypotension   . Stroke [facility])     Past Surgical History:  Procedure Laterality Date  . A/V FISTULAGRAM Left 01/09/2017   Procedure: A/V Fistulagram;  Surgeon: [name], MD;  Location: [place] [facility];  Service: Cardiovascular;  Laterality: Left;  . A/V FISTULAGRAM Left 06/06/2017   Procedure: A/V Fistulagram;  Surgeon: [name], MD;  Location: [place] [facility];  Service: Cardiovascular;  Laterality: Left;  . A/V FISTULAGRAM Left 08/15/2017   Procedure: A/V Fistulagram;  Surgeon: [name], MD;  Location: [place] [facility];  Service:  Cardiovascular;  Laterality: Left;  . A/V FISTULAGRAM Left 01/22/2018   Procedure: A/V FISTULAGRAM;  Surgeon: [name], MD;  Location: [place] [facility];  Service: Cardiovascular;  Laterality: Left;  . A/V SHUNT INTERVENTION N/A 06/06/2017   Procedure: A/V SHUNT INTERVENTION;  Surgeon: [name], MD;  Location: [place] [facility];  Service: Cardiovascular;  Laterality: N/A;  . AMPUTATION Right 04/25/2016   Procedure: TOE AMPUTATION;  Surgeon: Katha Cabal, MD;  Location: [facility];  Service: Vascular;  Laterality: Right;  . AMPUTATION Right 09/09/2018   Procedure: AMPUTATION ABOVE KNEE;  Surgeon: [name], MD;  Location: [facility];  Service: Vascular;  Laterality: Right;  . APPLICATION OF WOUND VAC Right 04/25/2016   Procedure: APPLICATION OF WOUND VAC;  Surgeon: Katha Cabal, MD;  Location: [facility];  Service: Vascular;  Laterality: Right;  . AV FISTULA PLACEMENT Left 2015  . CARDIAC CATHETERIZATION N/A 04/30/2016   Procedure: Left Heart Cath and Coronary Angiography;  Surgeon: Wellington Hampshire, MD;  Location: [place] [facility];  Service: Cardiovascular;  Laterality: N/A;  . CATARACT EXTRACTION, BILATERAL    . LEG AMPUTATION BELOW KNEE Left   . LOWER EXTREMITY ANGIOGRAPHY Right 07/24/2018   Procedure: LOWER EXTREMITY ANGIOGRAPHY;  Surgeon: [name], MD;  Location: [place] [facility];  Service: Cardiovascular;  Laterality: Right;  . LOWER EXTREMITY ANGIOGRAPHY Right 08/13/2018   Procedure: LOWER EXTREMITY ANGIOGRAPHY;  Surgeon: [name], MD;  Location: [place] [facility];  Service: Cardiovascular;  Laterality: Right;  . LOWER EXTREMITY ANGIOGRAPHY Right 09/04/2018   Procedure: Lower Extremity Angiography;  Surgeon: [name], MD;  Location: [place] [facility];  Service: Cardiovascular;  Laterality: Right;  . PERIPHERAL VASCULAR CATHETERIZATION N/A 10/03/2015   Procedure: A/V Shuntogram/Fistulagram;  Surgeon: [name], MD;  Location: [place] [facility];  Service: Cardiovascular;  Laterality: N/A;  . PERIPHERAL VASCULAR CATHETERIZATION N/A 01/16/2016   Procedure: Abdominal Aortogram w/Lower Extremity;  Surgeon: [name], MD;  Location: [place] [facility];  Service: Cardiovascular;  Laterality: N/A;  . PERIPHERAL VASCULAR CATHETERIZATION  01/16/2016   Procedure: Lower Extremity Intervention;  Surgeon: [name], MD;  Location: [place] [facility];  Service: Cardiovascular;;  . PERIPHERAL VASCULAR CATHETERIZATION Right 04/27/2016   Procedure: Lower Extremity Angiography;  Surgeon: Katha Cabal, MD;  Location: [place] [facility];  Service: Cardiovascular;  Laterality: Right;  . PERIPHERAL VASCULAR CATHETERIZATION  04/27/2016   Procedure: Lower Extremity Intervention;  Surgeon: Katha Cabal, MD;  Location: [place] [facility];  Service: Cardiovascular;;  . TRANSMETATARSAL AMPUTATION Right 04/28/2016   Procedure: TRANSMETATARSAL AMPUTATION;  Surgeon: Albertine Patricia, DPM;  Location: [facility];  Service: Podiatry;  Laterality: Right;  . WOUND DEBRIDEMENT Right 04/25/2016   Procedure: DEBRIDEMENT WOUND;  Surgeon: Katha Cabal, MD;  Location: [facility];  Service: Vascular;  Laterality: Right;     Home Medications:  Prior to Admission medications   Medication Sig Start Date End Date Taking? Authorizing Provider  aspirin EC 81 MG tablet Take 81 mg by mouth daily.   Yes [provider]  b complex-vitamin c-folic acid (NEPHRO-VITE) 0.8 MG TABS tablet Take 1 tablet by mouth daily.   Yes [provider]  cinacalcet (SENSIPAR) 60 MG tablet Take 60 mg by mouth daily.    Yes [provider]  clopidogrel (PLAVIX) 75 MG tablet Take 1 tablet (75 mg total) by mouth daily. 07/24/18  Yes [name], Erskine Squibb, MD  ferrous sulfate 325 (65 FE) MG EC tablet Take 325 mg by mouth 3 (three) times daily with meals.   Yes [provider]  glipiZIDE (GLUCOTROL) 10 MG tablet Take 10 mg by mouth daily.  10/25/16  Yes [provider]  lidocaine-prilocaine (EMLA) cream Apply 1 application topically as needed (port access).   Yes [provider]  omeprazole (PRILOSEC) 40 MG capsule Take 40 mg by mouth 2 (two) times daily.    Yes [provider]  polyethylene glycol (MIRALAX / GLYCOLAX) packet Take 17 g by mouth 2 (two) times daily.    Yes [provider]  sevelamer carbonate (RENVELA) 800 MG tablet Take 2,400 mg by mouth 3 (three) times daily with meals.    Yes [provider]  traMADol (ULTRAM) 50 MG tablet Take 1 tablet (50 mg total) by mouth every 6 (six) hours as needed. Patient taking differently: Take 50 mg by mouth every 6 (six) hours as needed for moderate pain or severe pain.  08/14/18 08/14/19 Yes [name], MD    Inpatient Medications: Scheduled Meds: . amiodarone  150 mg Intravenous Once  . aspirin EC  81 mg Oral Daily  . cinacalcet  60 mg Oral Once per day on  Mon Wed Fri  . epoetin (EPOGEN/PROCRIT) injection  4,000 Units Intravenous Q M,W,F-HD  . feeding supplement (NEPRO CARB STEADY)  237 mL Oral BID BM  . fentaNYL  25 mcg Intravenous Once  . ferrous sulfate  325 mg Oral TID WC  . insulin aspart  0-9 Units Subcutaneous TID WC  . midodrine  5 mg Oral TID WC  . multivitamin  1 tablet Oral QHS  . multivitamin-lutein  1 capsule Oral Daily  . pantoprazole  40 mg Oral BID  . polyethylene glycol  17 g Oral BID  . senna-docusate  1 tablet Oral BID  . sevelamer carbonate  2,400 mg Oral TID WC  . sodium zirconium cyclosilicate  10 g Oral Daily  . vitamin C  250 mg Oral BID   Continuous Infusions: . sodium chloride Stopped (09/10/18 0232)  . amiodarone 60 mg/hr (09/10/18 1154)   Followed by  . amiodarone    . small volume/piggyback builder Stopped (09/10/18 0700)  . piperacillin-tazobactam (ZOSYN)  IV Stopped (09/10/18 0641)   PRN Meds: sodium chloride, acetaminophen, docusate sodium, HYDROmorphone (DILAUDID) injection **OR** HYDROmorphone (DILAUDID)  injection, lidocaine-prilocaine, ondansetron (ZOFRAN) IV, oxyCODONE  Allergies:   No Known Allergies  Social History:   Social History   Socioeconomic History  . Marital status: Married    Spouse name: Not on file  . Number of children: Not on file  . Years of education: Not on file  . Highest education level: Not on file  Occupational History  . Not on file  Social Needs  . Financial resource strain: Not on file  . Food insecurity:    Worry: Patient refused    Inability: Patient refused  . Transportation needs:    Medical: No    Non-medical: No  Tobacco Use  . Smoking status: Never Smoker  . Smokeless tobacco: Never Used  Substance and Sexual Activity  . Alcohol use: No  . Drug use: No  . Sexual activity: Not on file  Lifestyle  . Physical activity:    Days per week: Not on file    Minutes per session: Not on file  . Stress: Not at all  Relationships  . Social connections:    Talks on phone: More than three times a week    Gets together: Three times a week    Attends religious service: Not on file    Active member of club or organization: Not on file    Attends meetings of clubs or organizations: Not on file    Relationship status: Not on file  . Intimate partner violence:    Fear of current or ex partner: No    Emotionally abused: Not on file    Physically abused: Not on file    Forced sexual activity: Not on file  Other Topics Concern  . Not on file  Social History Narrative  . Not on file    Family History:    Family History  Problem Relation Age of Onset  . Diabetes Father   . Prostate cancer Father      ROS:  Please see the history of present illness.   All other ROS reviewed and negative.     Physical Exam/Data:   Vitals:   09/10/18 1110 09/10/18 1120 09/10/18 1158 09/10/18 1200  BP: 96/67 92/70 (!) 148/138 93/66  Pulse: (!) 181 (!) 177 (!) 110 (!) 113  Resp: (!) 21 (!) 23 13 12   Temp:  (!) 97.5 F (36.4 C) 98.3 F (36.8 C)  TempSrc:   Oral Axillary   SpO2: 99% 99% 98% 97%  Weight:  105.7 kg 103.8 kg   Height:   4\' 2"  (1.27 m)     Intake/Output Summary (Last 24 hours) at 09/10/2018 1231 Last data filed at 09/10/2018 1206 Gross per 24 hour  Intake 290.68 ml  Output 1075 ml  Net -784.32 ml   Filed Weights   09/10/18 0806 09/10/18 1120 09/10/18 1158  Weight: 108.7 kg 105.7 kg 103.8 kg   Body mass index is 64.36 kg/m.  General:  Well nourished, well developed, fearful and anxious on exam. Intermittently somnolent. Joined by wife HEENT: normal Cardiac:  IRIR, tachycardic Lungs:  clear to auscultation bilaterally, no wheezing, rhonchi or rales  Abd: soft, nontender, no hepatomegaly  Ext: L BKA, R AKA, s/p R AKA Skin: warm and dry  Neuro:  CNs 2-12 intact, no focal abnormalities noted Psych:  Anxious, fearful  EKG:  The EKG was personally reviewed and demonstrates:  Afib with RVR as in HPI Telemetry:  Telemetry was personally reviewed and demonstrates: IRIR with ventricular rates as high as 198, NSVT, PVCs  Relevant CV Studies: LHC 04/30/2016  Ost LM to LM lesion, 10% stenosed.  Ost RCA to Prox RCA lesion, 100% stenosed.  There is moderate left ventricular systolic dysfunction.   1. Severe one-vessel coronary artery disease with chronically occluded proximal right coronary artery with left to right collaterals. Minimal irregularities affecting the left coronary arteries. 2. Moderately reduced LV systolic function with an ejection fraction of 30-35% with global hypokinesis more pronounced in the basal inferior wall. 3. Moderately elevated left ventricular end-diastolic pressure.  Recommendations: Occluded right coronary artery appears to be chronic with left to right collaterals. Recommend medical therapy for coronary artery disease and systolic heart failure. Blood pressure during dialysis is going to be a limiting factor. Continue metoprolol. The patient does have frequent PVCs. Recommend an outpatient  Holter monitor to quantify the PVCs which might be contributing to his degree of cardiomyopathy. EP evaluation at some point to see if he is a candidate for ICD placement.    Laboratory Data:  Chemistry Recent Labs  Lab 09/05/18 1202 09/08/18 0433 09/08/18 1405 09/09/18 0552  NA 135 133*  --  139  K 5.8* 6.0* 4.9 4.8  CL 96* 91*  --  94*  CO2 28 26  --  32  GLUCOSE 183* 199*  --  186*  BUN 45* 56*  --  44*  CREATININE 7.45* 9.61*  --  7.55*  CALCIUM 8.7* 8.9  --  9.6  GFRNONAA 7* 5*  --  7*  GFRAA 8* 6*  --  8*  ANIONGAP 11 16*  --  13    Recent Labs  Lab 09/05/18 1202  ALBUMIN 3.5   Hematology Recent Labs  Lab 09/08/18 0433 09/09/18 0552 09/10/18 0413  WBC 21.5* 20.1* 18.1*  RBC 3.27* 3.11* 3.12*  HGB 9.7* 9.2* 9.1*  HCT 31.6* 30.2* 30.3*  MCV 96.6 97.1 97.1  MCH 29.7 29.6 29.2  MCHC 30.7 30.5 30.0  RDW 16.0* 16.3* 16.2*  PLT 264 262 302   Cardiac EnzymesNo results for input(s): TROPONINI in the last 168 hours. No results for input(s): TROPIPOC in the last 168 hours.  BNPNo results for input(s): BNP, PROBNP in the last 168 hours.  DDimer No results for input(s): DDIMER in the last 168 hours.  Radiology/Studies:  No results found.  Assessment and Plan:   1. Likely new Onset Afib with RVR -  Presumed new onset Afib as patient and wife are not aware of irregular HR other than the 2017 polymorphic VT as above in HPI; however, ddx includes Paroxysmal Afib with RVR. Symptoms associated with Afib reported as anxiety, fear. - CHA2DS2VASc score of at least 6 (HFrEF, HTN, DM2, CVAx2, PAD) with 9.7% stroke risk per year and need for anticoagulation.  - Started on heparin. Continue heparin as patient is in new onset Afib and was started on amiodarone and so potential for pharmacologic conversion to SR. Continue heparin with daily CBC.  - Plan for rate control given elevated ventricular rates, PVCs, and NSVT as uncontrolled ventricular rates could contribute to and  exacerbate previously reported reduced EF / cardiomyopathy. Pending 11/27 today's echo results. Patient will likely need a repeat echo once ventricular rates are more controlled given today's echo performed with rates into the 200s. - Metoprolol 12.5mg  q6h started and should be continued with plan to titrate for rate control over 110s as BP allows and given current HD patient. Continue amiodarone as above.  - Recommend AM EKG.   2. HFrEF with 2017 LHC showing EF 30-35% - 2017 TTE and catheterization showed EF reduced with LHC EF 30-35%. Pending updated echo performed today. May need repeat echo once ventricular rates controlled to confirm today's echo findings.  - NS bolus provided given elevated HR and hypotension. Patient appears euvolemic on exam.   - Continue medical management. BP during HD is a limiting factor as noted above. Continue metoprolol. Continue amiodarone started in ICU as monitor HR/vitals.  Patient continues to have PVCs and with brief runs NSVT and, if echo shows continued reduced EF, would likely benefit from future EP evaluation for ICD placement and given h/o VT and arrest as in HPI above.  3. CAD with known severe 1v disease s/p LHC 2017 - No current CP. EKG as above. - LHC as above in HPI and copied in CV studies. Severe 1v CAD with recommendation for continued medical management and [facility] Cardiology outpatient follow-up but lost to follow-up.  - Continue medical management with ASA, BB with consideration of HD as limiting factor.  - Recommend updated / baseline lipid panel and liver function labs with initiation of statin given 2017 LHC.  - Labs show elevated troponin at 0.05. Continue to cycle with consideration of demand ischemia etiology in the setting of rapid ventricular rates  s/p AKA, ARF on HD. Cannot at this time r/o ischemia given 2017 cath with h/o severe 1v disease as above but with no indication / no recommendation for urgent ischemic evaluation with cardiac  catheterization unless subsequent troponin labs show bump / chest pain reported. Will continue to monitor and plan for outpatient follow-up after discharge.   4. ARF on HD with anemia of chronic disease - Ordered NS bolus given hypotension. BP labile given hemodialysis.  - Continue to monitor vitals. Daily BMET with consideration of ARF on HD. - Daily CBC and BMET to monitor Cr, electrolytes, and Hgb with known anemia of chronic disease and on heparin. - Further management per nephrology  5. HTN - As above, labile BP. Continue metoprolol.  - Monitor vitals  6. DM2 - SSI - Per IM  7. 11/262019 R AKA - Per vascular surgery      For questions or updates, please contact [facility] Please consult [email or web address] for contact info under     Signed, Arvil Chaco, PA-C  09/10/2018 12:31 PM

## 2018-09-10 NOTE — Progress Notes (Signed)
[facility]  Daily Progress Note   Subjective  - 1 Day Post-Op  Patient transferred to CCU after HD due to A. Fib with RVR during dialysis.  HR around 160 with BP 93/62.  Awake.  Pain control seems fair.  Dressing C/D/I  Objective Vitals:   09/10/18 1110 09/10/18 1120 09/10/18 1158 09/10/18 1200  BP: 96/67 92/70 (!) 148/138 93/66  Pulse: (!) 181 (!) 177 (!) 110 (!) 113  Resp: (!) 21 (!) 23 13 12   Temp:  (!) 97.5 F (36.4 C) 98.3 F (36.8 C)   TempSrc:  Oral Axillary   SpO2: 99% 99% 98% 97%  Weight:  105.7 kg 103.8 kg   Height:   4\' 2"  (1.27 m)     Intake/Output Summary (Last 24 hours) at 09/10/2018 1220 Last data filed at 09/10/2018 1206 Gross per 24 hour  Intake 290.68 ml  Output 1075 ml  Net -784.32 ml     CV  Tachycardic and irregular VASC  Dressing C/D/I  Laboratory CBC    Component Value Date/Time   WBC 18.1 (H) 09/10/2018 0413   HGB 9.1 (L) 09/10/2018 0413   HCT 30.3 (L) 09/10/2018 0413   PLT 302 09/10/2018 0413    BMET    Component Value Date/Time   NA 139 09/09/2018 0552   K 4.8 09/09/2018 0552   K 6.6 (HH) 03/10/2013 1351   CL 94 (L) 09/09/2018 0552   CO2 32 09/09/2018 0552   GLUCOSE 186 (H) 09/09/2018 0552   BUN 44 (H) 09/09/2018 0552   CREATININE 7.55 (H) 09/09/2018 0552   CALCIUM 9.6 09/09/2018 0552   GFRNONAA 7 (L) 09/09/2018 0552   GFRAA 8 (L) 09/09/2018 0552    Assessment/Planning: POD #1 s/p right AKA   A. Fib with RVR.  Cardiology to see.  Now in CCU  Right AKA stump dressing is C/D/I.  Leave this on for 2-3 more days.  PT when medically stable     Leotis Pain  09/10/2018, 12:20 PM

## 2018-09-10 NOTE — Progress Notes (Signed)
HD tx end    09/10/18 1110  Vital Signs  Pulse Rate (!) 181  Pulse Rate Source Monitor  Resp (!) 21  BP 96/67  BP Location Right Arm  BP Method Automatic  Patient Position (if appropriate) Lying  Oxygen Therapy  SpO2 99 %  O2 Device Nasal Cannula  O2 Flow Rate (L/min) 2 L/min  During Hemodialysis Assessment  Dialysis Fluid Bolus Normal Saline  Bolus Amount (mL) 250 mL  Intra-Hemodialysis Comments Tx completed

## 2018-09-11 DIAGNOSIS — I4891 Unspecified atrial fibrillation: Secondary | ICD-10-CM

## 2018-09-11 LAB — BASIC METABOLIC PANEL
Anion gap: 17 — ABNORMAL HIGH (ref 5–15)
BUN: 42 mg/dL — AB (ref 6–20)
CHLORIDE: 91 mmol/L — AB (ref 98–111)
CO2: 29 mmol/L (ref 22–32)
CREATININE: 6.75 mg/dL — AB (ref 0.61–1.24)
Calcium: 9.3 mg/dL (ref 8.9–10.3)
GFR calc Af Amer: 10 mL/min — ABNORMAL LOW (ref >60)
GFR calc non Af Amer: 8 mL/min — ABNORMAL LOW (ref >60)
GLUCOSE: 193 mg/dL — AB (ref 70–99)
Potassium: 4.6 mmol/L (ref 3.5–5.1)
Sodium: 137 mmol/L (ref 135–145)

## 2018-09-11 LAB — HEPARIN LEVEL (UNFRACTIONATED)
Heparin Unfractionated: <0.1 IU/mL — ABNORMAL LOW (ref 0.30–0.70)
Heparin Unfractionated: 0.25 IU/mL — ABNORMAL LOW (ref 0.30–0.70)

## 2018-09-11 LAB — GLUCOSE, CAPILLARY
GLUCOSE-CAPILLARY: 251 mg/dL — AB (ref 70–99)
GLUCOSE-CAPILLARY: 147 mg/dL — AB (ref 70–99)
Glucose-Capillary: 186 mg/dL — ABNORMAL HIGH (ref 70–99)

## 2018-09-11 LAB — CBC
HEMATOCRIT: 28.2 % — AB (ref 39.0–52.0)
HEMOGLOBIN: 8.4 g/dL — AB (ref 13.0–17.0)
MCH: 29.1 pg (ref 26.0–34.0)
MCHC: 29.8 g/dL — AB (ref 30.0–36.0)
MCV: 97.6 fL (ref 80.0–100.0)
Platelets: 300 10*3/uL (ref 150–400)
RBC: 2.89 MIL/uL — ABNORMAL LOW (ref 4.22–5.81)
RDW: 16.3 % — AB (ref 11.5–15.5)
WBC: 13.5 10*3/uL — AB (ref 4.0–10.5)
nRBC: 0.4 % — ABNORMAL HIGH (ref 0.0–0.2)

## 2018-09-11 MED ORDER — HEPARIN BOLUS VIA INFUSION
1300.0000 [IU] | Freq: Once | INTRAVENOUS | Status: DC
  Filled 2018-09-11: qty 1300

## 2018-09-11 MED ORDER — METOPROLOL TARTRATE 25 MG PO TABS
25.0000 mg | ORAL_TABLET | Freq: Two times a day (BID) | ORAL | Status: DC
  Administered 2018-09-11 – 2018-09-15 (×8): 25 mg via ORAL
  Filled 2018-09-11 (×8): qty 1

## 2018-09-11 MED ORDER — HYDROMORPHONE HCL 1 MG/ML IJ SOLN
0.5000 mg | INTRAMUSCULAR | Status: DC | PRN
  Administered 2018-09-11 – 2018-09-12 (×2): 0.5 mg via INTRAVENOUS
  Filled 2018-09-11 (×2): qty 1

## 2018-09-11 MED ORDER — FENTANYL CITRATE (PF) 100 MCG/2ML IJ SOLN
25.0000 ug | Freq: Once | INTRAMUSCULAR | Status: AC
  Administered 2018-09-11: 25 ug via INTRAVENOUS
  Filled 2018-09-11: qty 2

## 2018-09-11 MED ORDER — OXYCODONE HCL 5 MG PO TABS
5.0000 mg | ORAL_TABLET | ORAL | Status: DC | PRN
  Administered 2018-09-11 (×2): 10 mg via ORAL
  Filled 2018-09-11 (×3): qty 2

## 2018-09-11 MED ORDER — HYDROMORPHONE HCL 1 MG/ML IJ SOLN
2.0000 mg | INTRAMUSCULAR | Status: DC | PRN
  Administered 2018-09-11 – 2018-09-13 (×6): 2 mg via INTRAVENOUS
  Filled 2018-09-11 (×6): qty 2

## 2018-09-11 NOTE — Progress Notes (Signed)
Pt being transferred to room 249. Report called to [place], Therapist, sports. Pt and belongings transferred to room 249 without incident.

## 2018-09-11 NOTE — Progress Notes (Signed)
[place] [facility] [place]: [name]    MR#:  [id number]  DATE OF BIRTH:  [birth year]  SUBJECTIVE:  Complains of post op pain No chest pain or SOB  REVIEW OF SYSTEMS:    Review of Systems  Constitutional: Negative for fever, chills weight loss HENT: Negative for ear pain, nosebleeds, congestion, facial swelling, rhinorrhea, neck pain, neck stiffness and ear discharge.   Respiratory: Negative for cough, shortness of breath, wheezing  Cardiovascular: Negative for chest pain, palpitations and leg swelling.  Gastrointestinal: Negative for heartburn, abdominal pain, vomiting, diarrhea or consitpation Genitourinary: Negative for dysuria, urgency, frequency, hematuria Musculoskeletal: Pain RLE at post op site Neurological: Negative for dizziness, seizures, syncope, focal weakness,  numbness and headaches.  Hematological: Does not bruise/bleed easily.  Psychiatric/Behavioral: Negative for hallucinations, confusion, dysphoric mood  Tolerating Diet: yes  DRUG ALLERGIES:  No Known Allergies  VITALS:  Blood pressure (!) 122/29, pulse 72, temperature 98 F (36.7 C), temperature source Axillary, resp. rate 17, height 4\' 2"  (1.27 m), weight 103.8 kg, SpO2 99 %.  PHYSICAL EXAMINATION:  Constitutional: Bees. No distress. HENT: Normocephalic. Marland Kitchen Oropharynx is clear and moist.  Eyes: Conjunctivae and EOM are normal. PERRLA, no scleral icterus.  Neck: Normal ROM. Neck supple. No JVD.  CVS: S1, S2,  no murmurs, no gallops, no carotid bruit.  Pulmonary: Effort and breath sounds normal, no stridor, rhonchi, wheezes, rales.  Abdominal: Soft. BS +,  no distension, tenderness, rebound or guarding.  Musculoskeletal: Right AKA dressed. Left BKA stump Neuro: Alert. CN 2-12 grossly intact. No focal deficits. Skin: Skin is warm and dry. No rash noted. Psychiatric: Normal mood and affect.   LABORATORY PANEL:   CBC Recent Labs  Lab 09/11/18 0412  WBC 13.5*   HGB 8.4*  HCT 28.2*  PLT 300   ------------------------------------------------------------------------------------------------------------------  Chemistries  Recent Labs  Lab 09/10/18 0552  09/10/18 1803 09/11/18 0412  NA  --    < >  --  137  K  --    < >  --  4.6  CL  --    < >  --  91*  CO2  --    < >  --  29  GLUCOSE  --    < >  --  193*  BUN  --    < >  --  42*  CREATININE  --    < >  --  6.75*  CALCIUM  --    < >  --  9.3  MG 2.6*  --   --   --   AST  --   --  22  --   ALT  --   --  10  --   ALKPHOS  --   --  117  --   BILITOT  --   --  0.5  --    < > = values in this interval not displayed.   ------------------------------------------------------------------------------------------------------------------  Cardiac Enzymes Recent Labs  Lab 09/10/18 1256 09/10/18 1803 09/10/18 2321  TROPONINI 0.05* 0.60* 1.44*   ------------------------------------------------------------------------------------------------------------------  RADIOLOGY:  No results found.   ASSESSMENT AND PLAN:   57 year old male with end-stage renal disease on dialysis presented with early ischemic changes in the right lower extremity.  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 Will wait for vascular input  * End-stage renal disease on hemodialysis: Management as per nephrology  * Chronic systolic and diastolic heart failure  No signs of fluid overload  * Elevated troponin with CAD Likely demand ischemia from CTO of RCA. status post cardiac catheterization in 2017 showing  Ost LM to LM lesion, 10% stenosed.  Ost RCA to Prox RCA lesion, 100% stenosed.  Continue aspirin Due to low blood pressure he is not on hypertensive medications.   Management plans discussed with the patient and he is in  agreement.  CODE STATUS: Full code   TOTAL TIME TAKING CARE OF THIS PATIENT: 35 minutes.   POSSIBLE D/C 2-3 days, DEPENDING ON CLINICAL CONDITION.   Neita Carp M.D on 09/11/2018 at 8:44 AM  Between 7am to 6pm - Pager - [phone]  After 6pm go to [email or web address] - password [email or web address] [facility]  Office  [phone]  CC: Primary care physician; [name], MD  Note: This dictation was prepared with Dragon dictation along with smaller phrase technology. Any transcriptional errors that result from this process are unintentional.

## 2018-09-11 NOTE — Progress Notes (Signed)
Md notified. Pt is complaining of 8/10 pain after prn pain medication. Orders to increase dose to 10mg  and to increase frequency to every 4 hours. I will continue to assess.

## 2018-09-11 NOTE — Progress Notes (Addendum)
ANTICOAGULATION CONSULT NOTE - Initial Consult  Pharmacy Consult for Heparin  Indication: atrial fibrillation  No Known Allergies  Patient Measurements: Height: 4\' 2"  (127 cm) Weight: 228 lb 13.4 oz (103.8 kg) IBW/kg (Calculated) : 27 Heparin Dosing Weight: 89  Vital Signs: Temp: 98.5 F (36.9 C) (11/28 1945) Temp Source: Oral (11/28 1945) BP: 113/62 (11/28 1945) Pulse Rate: 64 (11/28 1945)  Labs: Recent Labs    09/09/18 0552 09/10/18 0413 09/10/18 1256 09/10/18 1803 09/10/18 2321 09/11/18 0412 09/11/18 1020 09/11/18 1907  HGB 9.2* 9.1* 9.7*  --   --  8.4*  --   --   HCT 30.2* 30.3* 32.3*  --   --  28.2*  --   --   PLT 262 302 295  --   --  300  --   --   APTT 38*  --   --   --   --   --   --   --   LABPROT 16.2*  --   --  15.4*  --   --   --   --   INR 1.32  --   --  1.23  --   --   --   --   HEPARINUNFRC  --   --   --   --  <0.10*  --  <0.10* 0.25*  CREATININE 7.55*  --  5.74*  --   --  6.75*  --   --   TROPONINI  --   --  0.05* 0.60* 1.44*  --   --   --     Estimated Creatinine Clearance: 9.7 mL/min (A) (by C-G formula based on SCr of 6.75 mg/dL (H)).   Medical History: Past Medical History:  Diagnosis Date  . Anemia of chronic disease   . Atherosclerosis   . Atrial fibrillation with rapid ventricular response (HCC)    chadsvasc at least 6; new onset versus paroxysmal with first or most recent episode on 09/10/18  . CAD (coronary artery disease)    2017 LHC, severe 1v disease, occluded pRCA, L-R collaterals, moderate LV dysfunction EF 30-35  . Diabetes mellitus with complication ([place])   . End stage renal disease on dialysis ([place])    Labile BP  . History of ventricular tachycardia    2017 polymorphic VT and cardiac arrest s/p angioplasty   . Hypercholesteremia   . Hyperlipidemia LDL goal <70   . Hypertension   . Labile blood pressure    on HD  . NSVT (nonsustained ventricular tachycardia) ([place])    08/2018 NSVT  . Peripheral arterial occlusive  disease (HCC)    s/p L BKA, R AKA  . S/P AKA (above knee amputation) unilateral, right ([place])    08/2018  . S/P BKA (below knee amputation) unilateral, left ([place])    2015  . Stroke ([place])   . Systolic heart failure ([place])    2017 EF per LHC 30-35%    Medications:  Medications Prior to Admission  Medication Sig Dispense Refill Last Dose  . aspirin EC 81 MG tablet Take 81 mg by mouth daily.   09/03/2018 at am  . b complex-vitamin c-folic acid (NEPHRO-VITE) 0.8 MG TABS tablet Take 1 tablet by mouth daily.   09/03/2018 at am  . cinacalcet (SENSIPAR) 60 MG tablet Take 60 mg by mouth daily.    09/03/2018 at am  . clopidogrel (PLAVIX) 75 MG tablet Take 1 tablet (75 mg total) by mouth daily. 30 tablet 11 09/03/2018 at 0800  .  ferrous sulfate 325 (65 FE) MG EC tablet Take 325 mg by mouth 3 (three) times daily with meals.   09/03/2018 at am  . glipiZIDE (GLUCOTROL) 10 MG tablet Take 10 mg by mouth daily.    09/03/2018 at am  . lidocaine-prilocaine (EMLA) cream Apply 1 application topically as needed (port access).   09/02/2018 at Unknown time  . omeprazole (PRILOSEC) 40 MG capsule Take 40 mg by mouth 2 (two) times daily.    09/03/2018 at am  . polyethylene glycol (MIRALAX / GLYCOLAX) packet Take 17 g by mouth 2 (two) times daily.    09/03/2018 at am  . sevelamer carbonate (RENVELA) 800 MG tablet Take 2,400 mg by mouth 3 (three) times daily with meals.    09/03/2018 at am  . traMADol (ULTRAM) 50 MG tablet Take 1 tablet (50 mg total) by mouth every 6 (six) hours as needed. (Patient taking differently: Take 50 mg by mouth every 6 (six) hours as needed for moderate pain or severe pain. ) 20 tablet 0 unknown at unknown    Assessment: Pharmacy consulted for heparin drip management for 57 yo male HD patient admitted to ICU for atrial fibrillation with RVR. Patient s/p right AKA on 11/26. Patient previously ordered heparin drip for limb ischemia prior to AKA with last rate 2550 units/hr. Patient with previous  left BKA. Per CCM/Vascular surgery patient is to receive no heparin boluses.  Goal of Therapy:  Heparin level 0.3-0.7 units/ml Monitor platelets by anticoagulation protocol: Yes   Plan:  Will initiate heparin at 1200 units/hr with no bolus. Will obtain initial heparin level at 2300. Patient is to receive no heparin boluses. Dosing/Plan discussed with CCM.   11/28 @ 1149  heparin level <0.1. Increase to 1900 units/hr. No bolus per consult. Recheck in 6 hours  11/28:  HL @ 1907 = 0.25 Will increase drip rate to 2050 units/hr.  Will recheck HL 6 hrs after rate change.  Will obtain CBC with am labs per protocol.   Latresha Yahr D 09/11/2018,7:46 PM

## 2018-09-11 NOTE — Progress Notes (Addendum)
    Subjective  - POD #2  C/o pain at stump.  Concerned dressing is too tight   Physical Exam:  Dressing removed and changed Stump looks good.  No drainage or erythema       Assessment/Plan:  POD #2  Increase dilaudid to q 2 hours Dressing changed.  Will change daily  Wells Shanigua Gibb 09/11/2018 3:55 PM --  Vitals:   09/11/18 1200 09/11/18 1412  BP: (!) 123/48 122/69  Pulse: 70 72  Resp: 19 19  Temp: 98 F (36.7 C) 98.4 F (36.9 C)  SpO2: 94% 94%    Intake/Output Summary (Last 24 hours) at 09/11/2018 1555 Last data filed at 09/11/2018 1300 Gross per 24 hour  Intake 3892.37 ml  Output -  Net 3892.37 ml     Laboratory CBC    Component Value Date/Time   WBC 13.5 (H) 09/11/2018 0412   HGB 8.4 (L) 09/11/2018 0412   HCT 28.2 (L) 09/11/2018 0412   PLT 300 09/11/2018 0412    BMET    Component Value Date/Time   NA 137 09/11/2018 0412   K 4.6 09/11/2018 0412   K 6.6 (HH) 03/10/2013 1351   CL 91 (L) 09/11/2018 0412   CO2 29 09/11/2018 0412   GLUCOSE 193 (H) 09/11/2018 0412   BUN 42 (H) 09/11/2018 0412   CREATININE 6.75 (H) 09/11/2018 0412   CALCIUM 9.3 09/11/2018 0412   GFRNONAA 8 (L) 09/11/2018 0412   GFRAA 10 (L) 09/11/2018 0412    COAG Lab Results  Component Value Date   INR 1.23 09/10/2018   INR 1.32 09/09/2018   INR 1.22 09/08/2018   No results found for: PTT  Antibiotics Anti-infectives (From admission, onward)   Start     Dose/Rate Route Frequency Ordered Stop   09/08/18 1400  piperacillin-tazobactam (ZOSYN) IVPB 3.375 g     3.375 g 12.5 mL/hr over 240 Minutes Intravenous Every 12 hours 09/08/18 1250     09/08/18 1100  piperacillin-tazobactam (ZOSYN) IVPB 3.375 g  Status:  Discontinued     3.375 g 12.5 mL/hr over 240 Minutes Intravenous Every 12 hours 09/08/18 1025 09/08/18 1250   09/04/18 1400  ceFAZolin (ANCEF) IVPB 1 g/50 mL premix  Status:  Discontinued     1 g 100 mL/hr over 30 Minutes Intravenous  Once 09/04/18 1358 09/04/18  1728   09/04/18 0600  ceFAZolin (ANCEF) IVPB 1 g/50 mL premix    Note to Pharmacy:  Send with pt to OR   1 g 100 mL/hr over 30 Minutes Intravenous On call 09/03/18 1844 09/04/18 1612       V. Leia Alf, M.D. [facility] Office: [phone] Pager:  [phone]

## 2018-09-11 NOTE — Progress Notes (Signed)
[facility]  ROUNDING NOTE   Subjective:   Patient was transferred to ICU yesterday for close monitoring.  A. fib with RVR.  Elevated troponin. This morning reports pain in the stump   Objective:  Vital signs in last 24 hours:  Temp:  [97.5 F (36.4 C)-98.8 F (37.1 C)] 98 F (36.7 C) (11/28 0800) Pulse Rate:  [66-181] 72 (11/28 0800) Resp:  [7-23] 17 (11/28 0800) BP: (91-148)/(29-138) 122/29 (11/28 0800) SpO2:  [96 %-100 %] 99 % (11/28 0800) Weight:  [103.8 kg-105.7 kg] 103.8 kg (11/27 1158)  Weight change:  Filed Weights   09/10/18 0806 09/10/18 1120 09/10/18 1158  Weight: 108.7 kg 105.7 kg 103.8 kg    Intake/Output: I/O last 3 completed shifts: In: 3768.2 [P.O.:360; I.V.:839.2; NG/GT:237; IV Piggyback:2332] Out: 8657 [Urine:400; Other:650]   Intake/Output this shift:  Total I/O In: 47.7 [I.V.:47.7] Out: -   Physical Exam: General: NAD,   Head: Normocephalic, atraumatic. Moist oral mucosal membranes  Eyes: Anicteric,   Neck: Supple,   Lungs:  Clear to auscultation  Heart:  Regular, sinus on telemetry  Abdomen:  Soft, nontender,   Extremities:  left BKA, right AKA  Neurologic: Nonfocal, able to answer questions  Skin: No lesions  Access: AVF    Basic Metabolic Panel: Recent Labs  Lab 09/05/18 1202 09/08/18 0433 09/08/18 1405 09/09/18 0552 09/10/18 0552 09/10/18 1256 09/11/18 0412  NA 135 133*  --  139  --  138 137  K 5.8* 6.0* 4.9 4.8  --  4.2 4.6  CL 96* 91*  --  94*  --  94* 91*  CO2 28 26  --  32  --  27 29  GLUCOSE 183* 199*  --  186*  --  233* 193*  BUN 45* 56*  --  44*  --  33* 42*  CREATININE 7.45* 9.61*  --  7.55*  --  5.74* 6.75*  CALCIUM 8.7* 8.9  --  9.6  --  9.3 9.3  MG  --  2.5*  --  2.3 2.6*  --   --   PHOS 5.6*  --   --   --  8.0*  --   --     Liver Function Tests: Recent Labs  Lab 09/05/18 1202 09/10/18 1803  AST  --  22  ALT  --  10  ALKPHOS  --  117  BILITOT  --  0.5  PROT  --  6.5  ALBUMIN 3.5 2.8*    No results for input(s): LIPASE, AMYLASE in the last 168 hours. No results for input(s): AMMONIA in the last 168 hours.  CBC: Recent Labs  Lab 09/08/18 0433 09/09/18 0552 09/10/18 0413 09/10/18 1256 09/11/18 0412  WBC 21.5* 20.1* 18.1* 13.3* 13.5*  NEUTROABS  --   --   --  10.7*  --   HGB 9.7* 9.2* 9.1* 9.7* 8.4*  HCT 31.6* 30.2* 30.3* 32.3* 28.2*  MCV 96.6 97.1 97.1 97.3 97.6  PLT 264 262 302 295 300    Cardiac Enzymes: Recent Labs  Lab 09/10/18 1256 09/10/18 1803 09/10/18 2321  TROPONINI 0.05* 0.60* 1.44*    BNP: Invalid input(s): POCBNP  CBG: Recent Labs  Lab 09/10/18 0732 09/10/18 1134 09/10/18 1737 09/10/18 2138 09/11/18 0740  GLUCAP 204* 187* 234* 183* 186*    Microbiology: Results for orders placed or performed during the hospital encounter of 08/12/18  MRSA PCR Screening     Status: None   Collection Time: 08/13/18  9:08 AM  Result Value Ref Range Status   MRSA by PCR NEGATIVE NEGATIVE Final    Comment:        The GeneXpert MRSA Assay (FDA approved for NASAL specimens only), is one component of a comprehensive MRSA colonization surveillance program. It is not intended to diagnose MRSA infection nor to guide or monitor treatment for MRSA infections. Performed at [facility], [address] [place], [place] [address]     Coagulation Studies: Recent Labs    09/09/18 0552 09/10/18 1803  LABPROT 16.2* 15.4*  INR 1.32 1.23    Urinalysis: No results for input(s): COLORURINE, LABSPEC, PHURINE, GLUCOSEU, HGBUR, BILIRUBINUR, KETONESUR, PROTEINUR, UROBILINOGEN, NITRITE, LEUKOCYTESUR in the last 72 hours.  Invalid input(s): APPERANCEUR    Imaging: No results found.   Medications:   . sodium chloride Stopped (09/10/18 0232)  . amiodarone 30 mg/hr (09/11/18 0800)  . small volume/piggyback builder 0 mL/hr at 09/10/18 0700  . heparin 1,550 Units/hr (09/11/18 0800)  . piperacillin-tazobactam (ZOSYN)  IV 3.375 g (09/11/18  0117)   . aspirin EC  81 mg Oral Daily  . cinacalcet  60 mg Oral Once per day on Mon Wed Fri  . epoetin (EPOGEN/PROCRIT) injection  4,000 Units Intravenous Q M,W,F-HD  . feeding supplement (NEPRO CARB STEADY)  237 mL Oral BID BM  . ferrous sulfate  325 mg Oral TID WC  . insulin aspart  0-9 Units Subcutaneous TID WC  . metoprolol tartrate  12.5 mg Oral Q6H  . midodrine  5 mg Oral TID WC  . multivitamin  1 tablet Oral QHS  . multivitamin-lutein  1 capsule Oral Daily  . pantoprazole  40 mg Oral BID  . polyethylene glycol  17 g Oral BID  . senna-docusate  1 tablet Oral BID  . sevelamer carbonate  2,400 mg Oral TID WC  . sodium zirconium cyclosilicate  10 g Oral Daily  . vitamin C  250 mg Oral BID   sodium chloride, acetaminophen, docusate sodium, HYDROmorphone (DILAUDID) injection **OR** HYDROmorphone (DILAUDID) injection, lidocaine-prilocaine, ondansetron (ZOFRAN) IV, oxyCODONE  Assessment/ Plan:  Mr. PASCAL STIGGERS is a 57 y.o. Hispanic male with end stage renal disease, peripheral vascular disease, hypertension, CVA, hyperlipidemia, diabetes mellitus type II who is admitted to [facility] 113 kg Left AVF  1. End Stage Renal Disease:  MWF schedule. With hyperkalemia Next HD will be scheduled for Friday Midodrine before treatment.  A. fib with RVR during dialysis.  Managed with amiodarone.  Monitored in the ICU Converted to sinus rhythm now   2. Anemia of chronic kidney disease: hemoglobin 8.4 - low dose EPO to be scheduled with HD  3. Hypotension - Patient takes midodrine before his dialysis treatments  4. Secondary Hyperparathyroidism:  - sevelamer and cinacalcet with meals.   5. Peripheral arterial disease: Underwent right above-the-knee amputation November 26 by Dr. Lucky Cowboy  6. Diabetes mellitus type II with chronic kidney disease: Continue glucose control.    LOS: 8 Oriana Horiuchi 11/28/20199:34 AM

## 2018-09-11 NOTE — Progress Notes (Signed)
[place] for heparin drip management  Indication: atrial fibrillation   No Known Allergies  Patient Measurements: Height: 4\' 2"  (127 cm) Weight: 228 lb 13.4 oz (103.8 kg) IBW/kg (Calculated) : 27 Heparin Dosing Weight: 89 kg   Vital Signs: Temp: 98.2 F (36.8 C) (11/28 0119) Temp Source: Oral (11/28 0119) BP: 107/68 (11/28 0200) Pulse Rate: 72 (11/28 0200)  Labs: Recent Labs    09/08/18 0433 09/09/18 0552 09/10/18 0413 09/10/18 1256 09/10/18 1803 09/10/18 2321  HGB 9.7* 9.2* 9.1* 9.7*  --   --   HCT 31.6* 30.2* 30.3* 32.3*  --   --   PLT 264 262 302 295  --   --   APTT  --  38*  --   --   --   --   LABPROT 15.3* 16.2*  --   --  15.4*  --   INR 1.22 1.32  --   --  1.23  --   HEPARINUNFRC 0.42  --   --   --   --  <0.10*  CREATININE 9.61* 7.55*  --  5.74*  --   --   TROPONINI  --   --   --  0.05* 0.60* 1.44*    Estimated Creatinine Clearance: 11.4 mL/min (A) (by C-G formula based on SCr of 5.74 mg/dL (H)).   Medical History: Past Medical History:  Diagnosis Date  . Anemia of chronic disease   . Atherosclerosis   . Atrial fibrillation with rapid ventricular response (HCC)    chadsvasc at least 6; new onset versus paroxysmal with first or most recent episode on 09/10/18  . CAD (coronary artery disease)    2017 LHC, severe 1v disease, occluded pRCA, L-R collaterals, moderate LV dysfunction EF 30-35  . Diabetes mellitus with complication ([place])   . End stage renal disease on dialysis ([place])    Labile BP  . History of ventricular tachycardia    2017 polymorphic VT and cardiac arrest s/p angioplasty   . Hypercholesteremia   . Hyperlipidemia LDL goal <70   . Hypertension   . Labile blood pressure    on HD  . NSVT (nonsustained ventricular tachycardia) ([place])    08/2018 NSVT  . Peripheral arterial occlusive disease (HCC)    s/p L BKA, R AKA  . S/P AKA (above knee amputation) unilateral, right ([place])    08/2018  . S/P BKA  (below knee amputation) unilateral, left ([place])    2015  . Stroke ([place])   . Systolic heart failure ([place])    2017 EF per LHC 30-35%    Medications:  Scheduled:  . aspirin EC  81 mg Oral Daily  . cinacalcet  60 mg Oral Once per day on Mon Wed Fri  . epoetin (EPOGEN/PROCRIT) injection  4,000 Units Intravenous Q M,W,F-HD  . feeding supplement (NEPRO CARB STEADY)  237 mL Oral BID BM  . ferrous sulfate  325 mg Oral TID WC  . insulin aspart  0-9 Units Subcutaneous TID WC  . metoprolol tartrate  12.5 mg Oral Q6H  . midodrine  5 mg Oral TID WC  . multivitamin  1 tablet Oral QHS  . multivitamin-lutein  1 capsule Oral Daily  . pantoprazole  40 mg Oral BID  . polyethylene glycol  17 g Oral BID  . senna-docusate  1 tablet Oral BID  . sevelamer carbonate  2,400 mg Oral TID WC  . sodium zirconium cyclosilicate  10 g Oral Daily  . vitamin  C  250 mg Oral BID   Infusions:  . sodium chloride Stopped (09/10/18 0232)  . amiodarone 30 mg/hr (09/11/18 0128)  . small volume/piggyback builder 0 mL/hr at 09/10/18 0700  . heparin 1,200 Units/hr (09/11/18 0000)  . piperacillin-tazobactam (ZOSYN)  IV 3.375 g (09/11/18 0117)    Assessment: Pharmacy consulted for heparin drip management for 57 yo male HD patient admitted to ICU for atrial fibrillation with RVR. Patient s/p right AKA on 11/26. Patient previously ordered heparin drip for limb ischemia prior to AKA with last rate 2550 units/hr. Patient with previous left BKA. Per CCM/Vascular surgery patient is to receive no heparin boluses.  Goal of Therapy:  Heparin level 0.3-0.7 units/ml Monitor platelets by anticoagulation protocol: Yes   Plan:  Will initiate heparin at 1200 units/hr with no bolus. Will obtain initial heparin level at 2300. Patient is to receive no heparin boluses. Dosing/Plan discussed with CCM.   1127 2330 heparin level <0.1. Increase to 1550 units/hr. No bolus per consult. Recheck in 8 hours  Will obtain CBC with am labs.    Pharmacy will continue to monitor and adjust per consult.   Oluwatoni Rotunno S 09/11/2018,2:10 AM

## 2018-09-11 NOTE — Progress Notes (Signed)
Name: [name] MRN: [id number] DOB: 1961-08-07     CONSULTATION DATE: 09/03/2018  Subjective & objectives: Afebrile, heparin infusion + amiodarone infusion.  PAST MEDICAL HISTORY :   has a past medical history of Anemia of chronic disease, Atherosclerosis, Atrial fibrillation with rapid ventricular response ([place]), CAD (coronary artery disease), Diabetes mellitus with complication ([place]), End stage renal disease on dialysis [facility]), History of ventricular tachycardia, Hypercholesteremia, Hyperlipidemia LDL goal <70, Hypertension, Labile blood pressure, NSVT (nonsustained ventricular tachycardia) ([place]), Peripheral arterial occlusive disease ([place]), S/P AKA (above knee amputation) unilateral, right ([place]), S/P BKA (below knee amputation) unilateral, left ([place]), Stroke ([place]), and Systolic heart failure ([place]).  has a past surgical history that includes AV fistula placement (Left, 2015); Cardiac catheterization (N/A, 10/03/2015); Cardiac catheterization (N/A, 01/16/2016); Cardiac catheterization (01/16/2016); Leg amputation below knee (Left); Wound debridement (Right, 04/25/2016); Application if wound vac (Right, 04/25/2016); Amputation (Right, 04/25/2016); Cardiac catheterization (Right, 04/27/2016); Cardiac catheterization (04/27/2016); Cardiac catheterization (N/A, 04/30/2016); Transmetatarsal amputation (Right, 04/28/2016); A/V Fistulagram (Left, 01/09/2017); Cataract extraction, bilateral; A/V Fistulagram (Left, 06/06/2017); A/V SHUNT INTERVENTION (N/A, 06/06/2017); A/V Fistulagram (Left, 08/15/2017); A/V Fistulagram (Left, 01/22/2018); Lower Extremity Angiography (Right, 07/24/2018); Lower Extremity Angiography (Right, 08/13/2018); Lower Extremity Angiography (Right, 09/04/2018); and Amputation (Right, 09/09/2018). Prior to Admission medications   Medication Sig Start Date End Date Taking? Authorizing Provider  aspirin EC 81 MG tablet Take 81 mg by mouth daily.   Yes [provider]  b complex-vitamin  c-folic acid (NEPHRO-VITE) 0.8 MG TABS tablet Take 1 tablet by mouth daily.   Yes [provider]  cinacalcet (SENSIPAR) 60 MG tablet Take 60 mg by mouth daily.    Yes [provider]  clopidogrel (PLAVIX) 75 MG tablet Take 1 tablet (75 mg total) by mouth daily. 07/24/18  Yes [name], Erskine Squibb, MD  ferrous sulfate 325 (65 FE) MG EC tablet Take 325 mg by mouth 3 (three) times daily with meals.   Yes [provider]  glipiZIDE (GLUCOTROL) 10 MG tablet Take 10 mg by mouth daily.  10/25/16  Yes [provider]  lidocaine-prilocaine (EMLA) cream Apply 1 application topically as needed (port access).   Yes [provider]  omeprazole (PRILOSEC) 40 MG capsule Take 40 mg by mouth 2 (two) times daily.    Yes [provider]  polyethylene glycol (MIRALAX / GLYCOLAX) packet Take 17 g by mouth 2 (two) times daily.    Yes [provider]  sevelamer carbonate (RENVELA) 800 MG tablet Take 2,400 mg by mouth 3 (three) times daily with meals.    Yes [provider]  traMADol (ULTRAM) 50 MG tablet Take 1 tablet (50 mg total) by mouth every 6 (six) hours as needed. Patient taking differently: Take 50 mg by mouth every 6 (six) hours as needed for moderate pain or severe pain.  08/14/18 08/14/19 Yes [name], MD   No Known Allergies  FAMILY HISTORY:  family history includes Diabetes in his father; Prostate cancer in his father. SOCIAL HISTORY:  reports that he has never smoked. He has never used smokeless tobacco. He reports that he does not drink alcohol or use drugs.  REVIEW OF SYSTEMS:   Unable to obtain due to critical illness   VITAL SIGNS: Temp:  [98 F (36.7 C)-98.8 F (37.1 C)] 98 F (36.7 C) (11/28 1200) Pulse Rate:  [66-104] 70 (11/28 1200) Resp:  [7-21] 19 (11/28 1200) BP: (95-135)/(29-108) 123/48 (11/28 1200) SpO2:  [94 %-100 %] 94 % (11/28 1200)  Physical Examination:  Awake and oriented with no acute focal motor  deficit Tolerating room air/[place], no distress, able to talk in full sentences, bilateral equal air entry no adventitious sounds S1 & S2 are audible with no murmur Benign abdominal exam with normal peristalsis Status post left BKA and right AKA with dressed amputation stump   ASSESSMENT / PLAN: A. fib with RVR.  And NSTEMI.  history of CAD, echo 09/10/2018 LVEF 65 to 70% -ASA + amiodarone + heparin GTT  End-stage renal disease and secondary hyperparathyroidism on HD and management as per renal  PAD status post left BKA and POD #2 right AKA  Diabetes mellitus -Glycemic control  Anemia of chronic kidney disease -Keep hemoglobin more than 7 g/dL  Full code  Supportive care  Critical care time 40 minutes

## 2018-09-11 NOTE — Progress Notes (Signed)
Progress Note  Patient Name: [name] Date of Encounter: 09/11/2018  Primary Cardiologist: Kathlyn Sacramento, MD   Subjective   The patient converted to sinus rhythm with amiodarone drip.  He denies chest pain or shortness of breath.  He complains of pain in his right leg.  Inpatient Medications    Scheduled Meds: . aspirin EC  81 mg Oral Daily  . cinacalcet  60 mg Oral Once per day on Mon Wed Fri  . epoetin (EPOGEN/PROCRIT) injection  4,000 Units Intravenous Q M,W,F-HD  . feeding supplement (NEPRO CARB STEADY)  237 mL Oral BID BM  . ferrous sulfate  325 mg Oral TID WC  . insulin aspart  0-9 Units Subcutaneous TID WC  . metoprolol tartrate  12.5 mg Oral Q6H  . midodrine  5 mg Oral TID WC  . multivitamin  1 tablet Oral QHS  . multivitamin-lutein  1 capsule Oral Daily  . pantoprazole  40 mg Oral BID  . polyethylene glycol  17 g Oral BID  . senna-docusate  1 tablet Oral BID  . sevelamer carbonate  2,400 mg Oral TID WC  . sodium zirconium cyclosilicate  10 g Oral Daily  . vitamin C  250 mg Oral BID   Continuous Infusions: . sodium chloride Stopped (09/10/18 0232)  . amiodarone 30 mg/hr (09/11/18 0800)  . small volume/piggyback builder 0 mL/hr at 09/10/18 0700  . heparin 1,550 Units/hr (09/11/18 0800)  . piperacillin-tazobactam (ZOSYN)  IV 3.375 g (09/11/18 0117)   PRN Meds: sodium chloride, acetaminophen, docusate sodium, HYDROmorphone (DILAUDID) injection **OR** HYDROmorphone (DILAUDID) injection, lidocaine-prilocaine, ondansetron (ZOFRAN) IV, oxyCODONE   Vital Signs    Vitals:   09/11/18 0408 09/11/18 0500 09/11/18 0622 09/11/18 0800  BP: (!) 105/50 (!) 122/55 95/73 (!) 122/29  Pulse: 70 77 66 72  Resp: 12 (!) 7 17 17   Temp:    98 F (36.7 C)  TempSrc:    Axillary  SpO2: 97% 98% 100% 99%  Weight:      Height:        Intake/Output Summary (Last 24 hours) at 09/11/2018 1021 Last data filed at 09/11/2018 0900 Gross per 24 hour  Intake 3712.39 ml  Output  650 ml  Net 3062.39 ml   Filed Weights   09/10/18 0806 09/10/18 1120 09/10/18 1158  Weight: 108.7 kg 105.7 kg 103.8 kg    Telemetry    Normal sinus rhythm with- Personally Reviewed  ECG     - Personally Reviewed  Physical Exam   GEN: No acute distress.   Neck: No JVD Cardiac: RRR, no murmurs, rubs, or gallops.  Respiratory: Clear to auscultation bilaterally. GI: Soft, nontender, non-distended  MS: No edema; No deformity. Neuro:  Nonfocal  Psych: Normal affect  Bilateral leg amputation  Labs    Chemistry Recent Labs  Lab 09/05/18 1202  09/09/18 0552 09/10/18 1256 09/10/18 1803 09/11/18 0412  NA 135   < > 139 138  --  137  K 5.8*   < > 4.8 4.2  --  4.6  CL 96*   < > 94* 94*  --  91*  CO2 28   < > 32 27  --  29  GLUCOSE 183*   < > 186* 233*  --  193*  BUN 45*   < > 44* 33*  --  42*  CREATININE 7.45*   < > 7.55* 5.74*  --  6.75*  CALCIUM 8.7*   < > 9.6 9.3  --  9.3  PROT  --   --   --   --  6.5  --   ALBUMIN 3.5  --   --   --  2.8*  --   AST  --   --   --   --  22  --   ALT  --   --   --   --  10  --   ALKPHOS  --   --   --   --  117  --   BILITOT  --   --   --   --  0.5  --   GFRNONAA 7*   < > 7* 10*  --  8*  GFRAA 8*   < > 8* 12*  --  10*  ANIONGAP 11   < > 13 17*  --  17*   < > = values in this interval not displayed.     Hematology Recent Labs  Lab 09/10/18 0413 09/10/18 1256 09/11/18 0412  WBC 18.1* 13.3* 13.5*  RBC 3.12* 3.32* 2.89*  HGB 9.1* 9.7* 8.4*  HCT 30.3* 32.3* 28.2*  MCV 97.1 97.3 97.6  MCH 29.2 29.2 29.1  MCHC 30.0 30.0 29.8*  RDW 16.2* 16.3* 16.3*  PLT 302 295 300    Cardiac Enzymes Recent Labs  Lab 09/10/18 1256 09/10/18 1803 09/10/18 2321  TROPONINI 0.05* 0.60* 1.44*   No results for input(s): TROPIPOC in the last 168 hours.   BNPNo results for input(s): BNP, PROBNP in the last 168 hours.   DDimer No results for input(s): DDIMER in the last 168 hours.   Radiology    No results found.  Cardiac Studies    Echocardiogram November 27:  - Procedure narrative: Transthoracic echocardiography for left   ventricular function evaluation, for right ventricular function   evaluation, and for assessment of valvular function. Image   quality was suboptimal. The study was technically difficult. - Left ventricle: The cavity size was normal. Wall thickness was   increased in a pattern of severe LVH. Systolic function was   vigorous. The estimated ejection fraction was in the range of 65%   to 70%, though atrial fibrillation with rapid ventricular   response limits evaluation. - Mitral valve: Calcified annulus. - Left atrium: The atrium was mildly dilated. - Right ventricle: The cavity size was normal. Systolic function   was normal.  Patient Profile     57 y.o. male with known history of coronary artery disease with CTO of the right coronary artery with left-to-right collaterals, peripheral arterial disease status post left BKA and recent AKA, end-stage renal disease on hemodialysis, CVA and multiple other comorbidities listed above who developed atrial fibrillation with rapid ventricular response during dialysis yesterday.  Assessment & Plan    1.  Atrial fibrillation with rapid ventricular response: He converted to sinus rhythm with amiodarone drip which should be continued today with plans to switch to oral amiodarone tomorrow.  Continue heparin drip for now for anticoagulation.  His chads vas score is at least 6.  We can consider starting him on Eliquis tomorrow. I am going to change metoprolol to 25 mg twice daily.  2.  History of chronic systolic heart failure: Echocardiogram yesterday showed normal LV systolic function.  3.  Coronary artery disease with known occluded RCA with left-to-right collaterals: Continue medical therapy.  Slightly elevated troponin is due to supply demand ischemia.  4.  Peripheral arterial disease: Managed by vascular.       For questions or updates,  please  contact [place] Please consult [email or web address] for contact info under        Signed, Kathlyn Sacramento, MD  09/11/2018, 10:21 AM

## 2018-09-11 NOTE — Progress Notes (Addendum)
[facility] for heparin drip management  Indication: atrial fibrillation   No Known Allergies  Patient Measurements: Height: 4\' 2"  (127 cm) Weight: 228 lb 13.4 oz (103.8 kg) IBW/kg (Calculated) : 27 Heparin Dosing Weight: 89 kg   Vital Signs: Temp: 98 F (36.7 C) (11/28 0800) Temp Source: Axillary (11/28 0800) BP: 122/29 (11/28 0800) Pulse Rate: 72 (11/28 0800)  Labs: Recent Labs    09/09/18 0552 09/10/18 0413 09/10/18 1256 09/10/18 1803 09/10/18 2321 09/11/18 0412 09/11/18 1020  HGB 9.2* 9.1* 9.7*  --   --  8.4*  --   HCT 30.2* 30.3* 32.3*  --   --  28.2*  --   PLT 262 302 295  --   --  300  --   APTT 38*  --   --   --   --   --   --   LABPROT 16.2*  --   --  15.4*  --   --   --   INR 1.32  --   --  1.23  --   --   --   HEPARINUNFRC  --   --   --   --  <0.10*  --  <0.10*  CREATININE 7.55*  --  5.74*  --   --  6.75*  --   TROPONINI  --   --  0.05* 0.60* 1.44*  --   --     Estimated Creatinine Clearance: 9.7 mL/min (A) (by C-G formula based on SCr of 6.75 mg/dL (H)).   Medical History: Past Medical History:  Diagnosis Date  . Anemia of chronic disease   . Atherosclerosis   . Atrial fibrillation with rapid ventricular response (HCC)    chadsvasc at least 6; new onset versus paroxysmal with first or most recent episode on 09/10/18  . CAD (coronary artery disease)    2017 LHC, severe 1v disease, occluded pRCA, L-R collaterals, moderate LV dysfunction EF 30-35  . Diabetes mellitus with complication ([place])   . End stage renal disease on dialysis ([place])    Labile BP  . History of ventricular tachycardia    2017 polymorphic VT and cardiac arrest s/p angioplasty   . Hypercholesteremia   . Hyperlipidemia LDL goal <70   . Hypertension   . Labile blood pressure    on HD  . NSVT (nonsustained ventricular tachycardia) ([place])    08/2018 NSVT  . Peripheral arterial occlusive disease (HCC)    s/p L BKA, R AKA  . S/P AKA (above knee  amputation) unilateral, right ([place])    08/2018  . S/P BKA (below knee amputation) unilateral, left ([place])    2015  . Stroke ([place])   . Systolic heart failure ([place])    2017 EF per LHC 30-35%    Medications:  Scheduled:  . aspirin EC  81 mg Oral Daily  . cinacalcet  60 mg Oral Once per day on Mon Wed Fri  . epoetin (EPOGEN/PROCRIT) injection  4,000 Units Intravenous Q M,W,F-HD  . feeding supplement (NEPRO CARB STEADY)  237 mL Oral BID BM  . ferrous sulfate  325 mg Oral TID WC  . insulin aspart  0-9 Units Subcutaneous TID WC  . metoprolol tartrate  25 mg Oral BID  . midodrine  5 mg Oral TID WC  . multivitamin  1 tablet Oral QHS  . multivitamin-lutein  1 capsule Oral Daily  . pantoprazole  40 mg Oral BID  . polyethylene glycol  17 g  Oral BID  . senna-docusate  1 tablet Oral BID  . sevelamer carbonate  2,400 mg Oral TID WC  . sodium zirconium cyclosilicate  10 g Oral Daily  . vitamin C  250 mg Oral BID   Infusions:  . sodium chloride Stopped (09/10/18 0232)  . amiodarone 30 mg/hr (09/11/18 0800)  . small volume/piggyback builder 0 mL/hr at 09/10/18 0700  . heparin 1,550 Units/hr (09/11/18 0800)  . piperacillin-tazobactam (ZOSYN)  IV 3.375 g (09/11/18 0117)    Assessment: Pharmacy consulted for heparin drip management for 57 yo male HD patient admitted to ICU for atrial fibrillation with RVR. Patient s/p right AKA on 11/26. Patient previously ordered heparin drip for limb ischemia prior to AKA with last rate 2550 units/hr. Patient with previous left BKA. Per CCM/Vascular surgery patient is to receive no heparin boluses.  Goal of Therapy:  Heparin level 0.3-0.7 units/ml Monitor platelets by anticoagulation protocol: Yes   Plan:  Will initiate heparin at 1200 units/hr with no bolus. Will obtain initial heparin level at 2300. Patient is to receive no heparin boluses. Dosing/Plan discussed with CCM.   11/28 @ 1149  heparin level <0.1. Increase to 1900 units/hr. No bolus per consult.  Recheck in 6 hours  Will obtain CBC with am labs per protocol.   Pharmacy will continue to monitor and adjust per consult.   Pernell Dupre, PharmD, BCPS Clinical Pharmacist 09/11/2018 11:59 AM

## 2018-09-12 LAB — SURGICAL PATHOLOGY

## 2018-09-12 LAB — GLUCOSE, CAPILLARY: Glucose-Capillary: 144 mg/dL — ABNORMAL HIGH (ref 70–99)

## 2018-09-12 LAB — CBC
HEMATOCRIT: 27.8 % — AB (ref 39.0–52.0)
HEMOGLOBIN: 8.5 g/dL — AB (ref 13.0–17.0)
MCH: 29.3 pg (ref 26.0–34.0)
MCHC: 30.6 g/dL (ref 30.0–36.0)
MCV: 95.9 fL (ref 80.0–100.0)
Platelets: 348 10*3/uL (ref 150–400)
RBC: 2.9 MIL/uL — ABNORMAL LOW (ref 4.22–5.81)
RDW: 16.3 % — AB (ref 11.5–15.5)
WBC: 13.2 10*3/uL — AB (ref 4.0–10.5)
nRBC: 0.4 % — ABNORMAL HIGH (ref 0.0–0.2)

## 2018-09-12 LAB — HEPARIN LEVEL (UNFRACTIONATED)
HEPARIN UNFRACTIONATED: 0.37 [IU]/mL (ref 0.30–0.70)
Heparin Unfractionated: 0.29 IU/mL — ABNORMAL LOW (ref 0.30–0.70)
Heparin Unfractionated: 0.36 IU/mL (ref 0.30–0.70)

## 2018-09-12 MED ORDER — AMIODARONE HCL 200 MG PO TABS
400.0000 mg | ORAL_TABLET | Freq: Two times a day (BID) | ORAL | Status: DC
  Administered 2018-09-12 – 2018-09-14 (×5): 400 mg via ORAL
  Filled 2018-09-12 (×5): qty 2

## 2018-09-12 NOTE — Progress Notes (Signed)
[facility]  ROUNDING NOTE   Subjective:   Patient seen during hemodialysis.  Tolerating well.   HEMODIALYSIS FLOWSHEET:  Blood Flow Rate (mL/min): 400 mL/min Arterial Pressure (mmHg): -180 mmHg Venous Pressure (mmHg): 160 mmHg Transmembrane Pressure (mmHg): 50 mmHg Ultrafiltration Rate (mL/min): 430 mL/min Dialysate Flow Rate (mL/min): 800 ml/min Conductivity: Machine : 14 Conductivity: Machine : 14 Dialysis Fluid Bolus: Normal Saline Bolus Amount (mL): 250 mL     Objective:  Vital signs in last 24 hours:  Temp:  [98 F (36.7 C)-99.1 F (37.3 C)] 98 F (36.7 C) (11/29 0946) Pulse Rate:  [64-72] 67 (11/29 1100) Resp:  [12-20] 13 (11/29 1100) BP: (97-136)/(45-76) 123/74 (11/29 1100) SpO2:  [91 %-100 %] 95 % (11/29 1100) Weight:  [101.7 kg] 101.7 kg (11/29 0547)  Weight change: -7.004 kg Filed Weights   09/10/18 1120 09/10/18 1158 09/12/18 0547  Weight: 105.7 kg 103.8 kg 101.7 kg    Intake/Output: I/O last 3 completed shifts: In: 1640.7 [P.O.:480; I.V.:952.4; IV Piggyback:208.4] Out: -    Intake/Output this shift:  No intake/output data recorded.  Physical Exam: General: NAD,   Head: Normocephalic, atraumatic. Moist oral mucosal membranes  Eyes: Anicteric,   Neck: Supple,   Lungs:  Clear to auscultation  Heart:  Regular, sinus on telemetry  Abdomen:  Soft, nontender,   Extremities:  left BKA, right AKA  Neurologic: Nonfocal, able to answer questions  Skin: No lesions  Access: AVF    Basic Metabolic Panel: Recent Labs  Lab 09/05/18 1202 09/08/18 0433 09/08/18 1405 09/09/18 0552 09/10/18 0552 09/10/18 1256 09/11/18 0412  NA 135 133*  --  139  --  138 137  K 5.8* 6.0* 4.9 4.8  --  4.2 4.6  CL 96* 91*  --  94*  --  94* 91*  CO2 28 26  --  32  --  27 29  GLUCOSE 183* 199*  --  186*  --  233* 193*  BUN 45* 56*  --  44*  --  33* 42*  CREATININE 7.45* 9.61*  --  7.55*  --  5.74* 6.75*  CALCIUM 8.7* 8.9  --  9.6  --  9.3 9.3  MG  --   2.5*  --  2.3 2.6*  --   --   PHOS 5.6*  --   --   --  8.0*  --   --     Liver Function Tests: Recent Labs  Lab 09/05/18 1202 09/10/18 1803  AST  --  22  ALT  --  10  ALKPHOS  --  117  BILITOT  --  0.5  PROT  --  6.5  ALBUMIN 3.5 2.8*   No results for input(s): LIPASE, AMYLASE in the last 168 hours. No results for input(s): AMMONIA in the last 168 hours.  CBC: Recent Labs  Lab 09/09/18 0552 09/10/18 0413 09/10/18 1256 09/11/18 0412 09/12/18 0257  WBC 20.1* 18.1* 13.3* 13.5* 13.2*  NEUTROABS  --   --  10.7*  --   --   HGB 9.2* 9.1* 9.7* 8.4* 8.5*  HCT 30.2* 30.3* 32.3* 28.2* 27.8*  MCV 97.1 97.1 97.3 97.6 95.9  PLT 262 302 295 300 348    Cardiac Enzymes: Recent Labs  Lab 09/10/18 1256 09/10/18 1803 09/10/18 2321  TROPONINI 0.05* 0.60* 1.44*    BNP: Invalid input(s): POCBNP  CBG: Recent Labs  Lab 09/10/18 1737 09/10/18 2138 09/11/18 0740 09/11/18 1143 09/11/18 2130  GLUCAP 234* 183* 186* 251* 147*  Microbiology: Results for orders placed or performed during the hospital encounter of 08/12/18  MRSA PCR Screening     Status: None   Collection Time: 08/13/18  9:08 AM  Result Value Ref Range Status   MRSA by PCR NEGATIVE NEGATIVE Final    Comment:        The GeneXpert MRSA Assay (FDA approved for NASAL specimens only), is one component of a comprehensive MRSA colonization surveillance program. It is not intended to diagnose MRSA infection nor to guide or monitor treatment for MRSA infections. Performed at [facility], [address] [place], [place] [address]     Coagulation Studies: Recent Labs    09/10/18 1803  LABPROT 15.4*  INR 1.23    Urinalysis: No results for input(s): COLORURINE, LABSPEC, PHURINE, GLUCOSEU, HGBUR, BILIRUBINUR, KETONESUR, PROTEINUR, UROBILINOGEN, NITRITE, LEUKOCYTESUR in the last 72 hours.  Invalid input(s): APPERANCEUR    Imaging: No results found.   Medications:   . sodium chloride  Stopped (09/10/18 0232)  . small volume/piggyback builder 50 mL/hr at 09/11/18 2231  . heparin 2,250 Units/hr (09/12/18 0540)  . piperacillin-tazobactam (ZOSYN)  IV 3.375 g (09/12/18 0103)   . amiodarone  400 mg Oral BID  . aspirin EC  81 mg Oral Daily  . cinacalcet  60 mg Oral Once per day on Mon Wed Fri  . epoetin (EPOGEN/PROCRIT) injection  4,000 Units Intravenous Q M,W,F-HD  . feeding supplement (NEPRO CARB STEADY)  237 mL Oral BID BM  . ferrous sulfate  325 mg Oral TID WC  . insulin aspart  0-9 Units Subcutaneous TID WC  . metoprolol tartrate  25 mg Oral BID  . midodrine  5 mg Oral TID WC  . multivitamin  1 tablet Oral QHS  . multivitamin-lutein  1 capsule Oral Daily  . pantoprazole  40 mg Oral BID  . polyethylene glycol  17 g Oral BID  . senna-docusate  1 tablet Oral BID  . sevelamer carbonate  2,400 mg Oral TID WC  . sodium zirconium cyclosilicate  10 g Oral Daily  . vitamin C  250 mg Oral BID   sodium chloride, acetaminophen, docusate sodium, HYDROmorphone (DILAUDID) injection **OR** HYDROmorphone (DILAUDID) injection, lidocaine-prilocaine, ondansetron (ZOFRAN) IV, oxyCODONE  Assessment/ Plan:  [name] is a 57 y.o. Hispanic male with end stage renal disease, peripheral vascular disease, hypertension, CVA, hyperlipidemia, diabetes mellitus type II who is admitted to [facility] 113 kg Left AVF  1. End Stage Renal Disease:  MWF schedule. With hyperkalemia Patient seen during dialysis Tolerating well  Midodrine before treatment.  A. fib with RVR during dialysis during last session.  Required IV amiodarone administration LoKelma can be discontinued as hemodialysis is expected to lower the potassium.     2. Anemia of chronic kidney disease: hemoglobin 8.5 - low dose EPO to be scheduled with HD  3. Hypotension - Patient takes midodrine before his dialysis treatments  4. Secondary Hyperparathyroidism:  - sevelamer and cinacalcet with  meals.   5. Peripheral arterial disease: Underwent right above-the-knee amputation November 26 by Dr. Lucky Cowboy  6. Diabetes mellitus type II with chronic kidney disease: Continue glucose control.    LOS: 9 [name] 11/29/201911:04 AM

## 2018-09-12 NOTE — Progress Notes (Signed)
HD tx end    09/12/18 1339  Vital Signs  Pulse Rate 70  Pulse Rate Source Monitor  Resp 12  BP 111/62  BP Location Right Arm  BP Method Automatic  Patient Position (if appropriate) Lying  Oxygen Therapy  SpO2 97 %  O2 Device Room Air  During Hemodialysis Assessment  Dialysis Fluid Bolus Normal Saline  Bolus Amount (mL) 250 mL  Intra-Hemodialysis Comments Tx completed

## 2018-09-12 NOTE — Progress Notes (Signed)
[facility] [place] NAME: [name]    MR#:  [id number]  DATE OF BIRTH:  [birth year]  SUBJECTIVE:   Pain surgical site.  No SOB  REVIEW OF SYSTEMS:    Review of Systems  Constitutional: Negative for fever, chills weight loss HENT: Negative for ear pain, nosebleeds, congestion, facial swelling, rhinorrhea, neck pain, neck stiffness and ear discharge.   Respiratory: Negative for cough, shortness of breath, wheezing  Cardiovascular: Negative for chest pain, palpitations and leg swelling.  Gastrointestinal: Negative for heartburn, abdominal pain, vomiting, diarrhea or consitpation Genitourinary: Negative for dysuria, urgency, frequency, hematuria Musculoskeletal: Pain RLE at post op site Neurological: Negative for dizziness, seizures, syncope, focal weakness,  numbness and headaches.  Hematological: Does not bruise/bleed easily.  Psychiatric/Behavioral: Negative for hallucinations, confusion, dysphoric mood  Tolerating Diet: yes  DRUG ALLERGIES:  No Known Allergies  VITALS:  Blood pressure 123/75, pulse 74, temperature 98 F (36.7 C), temperature source Oral, resp. rate 13, height 4\' 2"  (1.27 m), weight 104.5 kg, SpO2 96 %.  PHYSICAL EXAMINATION:  Constitutional: Bees. No distress. HENT: Normocephalic. Marland Kitchen Oropharynx is clear and moist.  Eyes: Conjunctivae and EOM are normal. PERRLA, no scleral icterus.  Neck: Normal ROM. Neck supple. No JVD.  CVS: S1, S2,  no murmurs, no gallops, no carotid bruit.  Pulmonary: Effort and breath sounds normal, no stridor, rhonchi, wheezes, rales.  Abdominal: Soft. BS +,  no distension, tenderness, rebound or guarding.  Musculoskeletal: Right AKA dressed. Left BKA stump Neuro: Alert. CN 2-12 grossly intact. No focal deficits. Skin: Skin is warm and dry. No rash noted. Psychiatric: Normal mood and affect.   LABORATORY PANEL:   CBC Recent Labs  Lab 09/12/18 0257  WBC 13.2*  HGB 8.5*  HCT 27.8*   PLT 348   ------------------------------------------------------------------------------------------------------------------  Chemistries  Recent Labs  Lab 09/10/18 0552  09/10/18 1803 09/11/18 0412  NA  --    < >  --  137  K  --    < >  --  4.6  CL  --    < >  --  91*  CO2  --    < >  --  29  GLUCOSE  --    < >  --  193*  BUN  --    < >  --  42*  CREATININE  --    < >  --  6.75*  CALCIUM  --    < >  --  9.3  MG 2.6*  --   --   --   AST  --   --  22  --   ALT  --   --  10  --   ALKPHOS  --   --  117  --   BILITOT  --   --  0.5  --    < > = values in this interval not displayed.   ------------------------------------------------------------------------------------------------------------------  Cardiac Enzymes Recent Labs  Lab 09/10/18 1256 09/10/18 1803 09/10/18 2321  TROPONINI 0.05* 0.60* 1.44*   ------------------------------------------------------------------------------------------------------------------  RADIOLOGY:  No results found.   ASSESSMENT AND PLAN:   57 year old male with end-stage renal disease on dialysis presented with early ischemic changes in the right lower extremity.  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 Wound looks  clean per vascular eval. Will stop abx  * End-stage renal disease on hemodialysis: Management as per nephrology  * Chronic systolic and diastolic heart failure  No signs of fluid overload  * Elevated troponin with CAD Likely demand ischemia from CTO of RCA. status post cardiac catheterization in 2017 showing  Ost LM to LM lesion, 10% stenosed.  Ost RCA to Prox RCA lesion, 100% stenosed.  Continue aspirin Due to low blood pressure he is not on hypertensive medications.   Management plans discussed with the patient and he is in  agreement.  CODE STATUS: Full code   TOTAL TIME TAKING CARE OF THIS PATIENT: 35 minutes.   POSSIBLE D/C 2-3 days, DEPENDING ON CLINICAL CONDITION.   Neita Carp M.D on 09/12/2018 at 12:17 PM  Between 7am to 6pm - Pager - [phone]  After 6pm go to [email or web address] - password [email or web address] [facility]  Office  [phone]  CC: Primary care physician; [name], MD  Note: This dictation was prepared with Dragon dictation along with smaller phrase technology. Any transcriptional errors that result from this process are unintentional.

## 2018-09-12 NOTE — Progress Notes (Signed)
HD tx start    09/12/18 0957  Vital Signs  Pulse Rate 71  Pulse Rate Source Monitor  Resp 17  BP 136/69  BP Location Right Arm  BP Method Automatic  Patient Position (if appropriate) Lying  Oxygen Therapy  SpO2 92 %  O2 Device Room Air  During Hemodialysis Assessment  Blood Flow Rate (mL/min) 400 mL/min  Arterial Pressure (mmHg) -150 mmHg  Venous Pressure (mmHg) 140 mmHg  Transmembrane Pressure (mmHg) 50 mmHg  Ultrafiltration Rate (mL/min) 430 mL/min  Dialysate Flow Rate (mL/min) 800 ml/min  Conductivity: Machine  14  HD Safety Checks Performed Yes  Dialysis Fluid Bolus Normal Saline  Bolus Amount (mL) 250 mL  Intra-Hemodialysis Comments Tx initiated  Fistula / Graft Left Forearm Arteriovenous fistula  No Placement Date or Time found.   Placed prior to admission: Yes  Orientation: Left  Access Location: Forearm  Access Type: Arteriovenous fistula  Status Accessed  Needle Size 15

## 2018-09-12 NOTE — Care Management Important Message (Signed)
Copy of Medicare IM left in patient's room (out for dialysis).

## 2018-09-12 NOTE — Progress Notes (Signed)
ANTICOAGULATION CONSULT NOTE - Initial Consult  Pharmacy Consult for Heparin  Indication: atrial fibrillation  No Known Allergies  Patient Measurements: Height: 4\' 2"  (127 cm) Weight: 228 lb 13.4 oz (103.8 kg) IBW/kg (Calculated) : 27 Heparin Dosing Weight: 89  Vital Signs: Temp: 98.5 F (36.9 C) (11/28 1945) Temp Source: Oral (11/28 1945) BP: 113/62 (11/28 1945) Pulse Rate: 64 (11/28 1945)  Labs: Recent Labs    09/09/18 0552  09/10/18 1256 09/10/18 1803  09/10/18 2321 09/11/18 0412 09/11/18 1020 09/11/18 1907 09/12/18 0257  HGB 9.2*   < > 9.7*  --   --   --  8.4*  --   --  8.5*  HCT 30.2*   < > 32.3*  --   --   --  28.2*  --   --  27.8*  PLT 262   < > 295  --   --   --  300  --   --  348  APTT 38*  --   --   --   --   --   --   --   --   --   LABPROT 16.2*  --   --  15.4*  --   --   --   --   --   --   INR 1.32  --   --  1.23  --   --   --   --   --   --   HEPARINUNFRC  --   --   --   --    < > <0.10*  --  <0.10* 0.25* 0.29*  CREATININE 7.55*  --  5.74*  --   --   --  6.75*  --   --   --   TROPONINI  --   --  0.05* 0.60*  --  1.44*  --   --   --   --    < > = values in this interval not displayed.    Estimated Creatinine Clearance: 9.7 mL/min (A) (by C-G formula based on SCr of 6.75 mg/dL (H)).   Medical History: Past Medical History:  Diagnosis Date  . Anemia of chronic disease   . Atherosclerosis   . Atrial fibrillation with rapid ventricular response (HCC)    chadsvasc at least 6; new onset versus paroxysmal with first or most recent episode on 09/10/18  . CAD (coronary artery disease)    2017 LHC, severe 1v disease, occluded pRCA, L-R collaterals, moderate LV dysfunction EF 30-35  . Diabetes mellitus with complication ([place])   . End stage renal disease on dialysis ([place])    Labile BP  . History of ventricular tachycardia    2017 polymorphic VT and cardiac arrest s/p angioplasty   . Hypercholesteremia   . Hyperlipidemia LDL goal <70   . Hypertension    . Labile blood pressure    on HD  . NSVT (nonsustained ventricular tachycardia) ([place])    08/2018 NSVT  . Peripheral arterial occlusive disease (HCC)    s/p L BKA, R AKA  . S/P AKA (above knee amputation) unilateral, right ([place])    08/2018  . S/P BKA (below knee amputation) unilateral, left ([place])    2015  . Stroke ([place])   . Systolic heart failure ([place])    2017 EF per LHC 30-35%    Medications:  Medications Prior to Admission  Medication Sig Dispense Refill Last Dose  . aspirin EC 81 MG tablet Take 81 mg by mouth daily.  09/03/2018 at am  . b complex-vitamin c-folic acid (NEPHRO-VITE) 0.8 MG TABS tablet Take 1 tablet by mouth daily.   09/03/2018 at am  . cinacalcet (SENSIPAR) 60 MG tablet Take 60 mg by mouth daily.    09/03/2018 at am  . clopidogrel (PLAVIX) 75 MG tablet Take 1 tablet (75 mg total) by mouth daily. 30 tablet 11 09/03/2018 at 0800  . ferrous sulfate 325 (65 FE) MG EC tablet Take 325 mg by mouth 3 (three) times daily with meals.   09/03/2018 at am  . glipiZIDE (GLUCOTROL) 10 MG tablet Take 10 mg by mouth daily.    09/03/2018 at am  . lidocaine-prilocaine (EMLA) cream Apply 1 application topically as needed (port access).   09/02/2018 at Unknown time  . omeprazole (PRILOSEC) 40 MG capsule Take 40 mg by mouth 2 (two) times daily.    09/03/2018 at am  . polyethylene glycol (MIRALAX / GLYCOLAX) packet Take 17 g by mouth 2 (two) times daily.    09/03/2018 at am  . sevelamer carbonate (RENVELA) 800 MG tablet Take 2,400 mg by mouth 3 (three) times daily with meals.    09/03/2018 at am  . traMADol (ULTRAM) 50 MG tablet Take 1 tablet (50 mg total) by mouth every 6 (six) hours as needed. (Patient taking differently: Take 50 mg by mouth every 6 (six) hours as needed for moderate pain or severe pain. ) 20 tablet 0 unknown at unknown    Assessment: Pharmacy consulted for heparin drip management for 57 yo male HD patient admitted to ICU for atrial fibrillation with RVR. Patient s/p  right AKA on 11/26. Patient previously ordered heparin drip for limb ischemia prior to AKA with last rate 2550 units/hr. Patient with previous left BKA. Per CCM/Vascular surgery patient is to receive no heparin boluses.  Goal of Therapy:  Heparin level 0.3-0.7 units/ml Monitor platelets by anticoagulation protocol: Yes   Plan:  Will initiate heparin at 1200 units/hr with no bolus. Will obtain initial heparin level at 2300. Patient is to receive no heparin boluses. Dosing/Plan discussed with CCM.   11/28 @ 1149  heparin level <0.1. Increase to 1900 units/hr. No bolus per consult. Recheck in 6 hours  11/28:  HL @ 1907 = 0.25 Will increase drip rate to 2050 units/hr.  Will recheck HL 6 hrs after rate change.  Will obtain CBC with am labs per protocol.   11/29 0300 heparin level 0.29. Increase to 2250 units/hr and recheck in 8 hours.   Tavionna Grout S 09/12/2018,5:25 AM

## 2018-09-12 NOTE — Progress Notes (Signed)
Post HD assessment    09/12/18 1343  Neurological  Level of Consciousness Alert  Orientation Level Oriented X4  Respiratory  Respiratory Pattern Regular;Unlabored  Chest Assessment Chest expansion symmetrical  Cardiac  ECG Monitor Yes  Cardiac Rhythm NSR  Vascular  R Radial Pulse +2  L Radial Pulse +2  Edema Generalized;Right lower extremity;Left lower extremity  Integumentary  Integumentary (WDL) X  Skin Color Appropriate for ethnicity  Musculoskeletal  Musculoskeletal (WDL) X  Generalized Weakness Yes  Assistive Device None  GU Assessment  Genitourinary (WDL) X  Genitourinary Symptoms  (HD)  Psychosocial  Psychosocial (WDL) WDL

## 2018-09-12 NOTE — Progress Notes (Signed)
ANTICOAGULATION CONSULT NOTE  Pharmacy Consult for Heparin  Indication: atrial fibrillation  No Known Allergies  Patient Measurements: Height: 4\' 2"  (127 cm) Weight: 227 lb 1.6 oz (103 kg) IBW/kg (Calculated) : 27 Heparin Dosing Weight: 89  Vital Signs: Temp: 97.7 F (36.5 C) (11/29 1948) Temp Source: Oral (11/29 1948) BP: 114/65 (11/29 1948) Pulse Rate: 71 (11/29 1948)  Labs: Recent Labs    09/10/18 1256 09/10/18 1803 09/10/18 2321 09/11/18 0412  09/12/18 0257 09/12/18 1251 09/12/18 2100  HGB 9.7*  --   --  8.4*  --  8.5*  --   --   HCT 32.3*  --   --  28.2*  --  27.8*  --   --   PLT 295  --   --  300  --  348  --   --   LABPROT  --  15.4*  --   --   --   --   --   --   INR  --  1.23  --   --   --   --   --   --   HEPARINUNFRC  --   --  <0.10*  --    < > 0.29* 0.37 0.36  CREATININE 5.74*  --   --  6.75*  --   --   --   --   TROPONINI 0.05* 0.60* 1.44*  --   --   --   --   --    < > = values in this interval not displayed.    Estimated Creatinine Clearance: 9.7 mL/min (A) (by C-G formula based on SCr of 6.75 mg/dL (H)).   Medical History: Past Medical History:  Diagnosis Date  . Anemia of chronic disease   . Atherosclerosis   . Atrial fibrillation with rapid ventricular response (HCC)    chadsvasc at least 6; new onset versus paroxysmal with first or most recent episode on 09/10/18  . CAD (coronary artery disease)    2017 LHC, severe 1v disease, occluded pRCA, L-R collaterals, moderate LV dysfunction EF 30-35  . Diabetes mellitus with complication ([place])   . End stage renal disease on dialysis ([place])    Labile BP  . History of ventricular tachycardia    2017 polymorphic VT and cardiac arrest s/p angioplasty   . Hypercholesteremia   . Hyperlipidemia LDL goal <70   . Hypertension   . Labile blood pressure    on HD  . NSVT (nonsustained ventricular tachycardia) ([place])    08/2018 NSVT  . Peripheral arterial occlusive disease (HCC)    s/p L BKA, R AKA  . S/P  AKA (above knee amputation) unilateral, right ([place])    08/2018  . S/P BKA (below knee amputation) unilateral, left ([place])    2015  . Stroke ([place])   . Systolic heart failure ([place])    2017 EF per LHC 30-35%    Assessment: Pharmacy consulted for heparin drip management for 57 yo male HD patient admitted for atrial fibrillation with RVR. Patient s/p right AKA on 11/26. Patient previously ordered heparin drip for limb ischemia prior to AKA with last rate 2550 units/hr. Patient with previous left BKA. Per CCM/Vascular surgery patient is to receive no heparin boluses.  Per Cardiology, patient to transition to apixaban once cleared of any additional surgeries or procedures.  Goal of Therapy:  Heparin level 0.3-0.7 units/ml Monitor platelets by anticoagulation protocol: Yes   Plan:  HL 0.37 therapeutic. Continue heparin drip at 2250 units/hr. Heparin  level ordered for 2100 to confirm.  11/29 2100 heparin level 0.36. Continue current regimen. Recheck heparin level and CBC with tomorrow AM labs.  CBC with morning labs.  Vallory Oetken S, PharmD 09/12/2018 10:02 PM

## 2018-09-12 NOTE — Progress Notes (Addendum)
ANTICOAGULATION CONSULT NOTE  Pharmacy Consult for Heparin  Indication: atrial fibrillation  No Known Allergies  Patient Measurements: Height: 4\' 2"  (127 cm) Weight: 227 lb 1.6 oz (103 kg) IBW/kg (Calculated) : 27 Heparin Dosing Weight: 89  Vital Signs: Temp: 98.6 F (37 C) (11/29 1344) Temp Source: Oral (11/29 1344) BP: 113/65 (11/29 1344) Pulse Rate: 72 (11/29 1344)  Labs: Recent Labs    09/10/18 1256 09/10/18 1803 09/10/18 2321 09/11/18 0412  09/11/18 1907 09/12/18 0257 09/12/18 1251  HGB 9.7*  --   --  8.4*  --   --  8.5*  --   HCT 32.3*  --   --  28.2*  --   --  27.8*  --   PLT 295  --   --  300  --   --  348  --   LABPROT  --  15.4*  --   --   --   --   --   --   INR  --  1.23  --   --   --   --   --   --   HEPARINUNFRC  --   --  <0.10*  --    < > 0.25* 0.29* 0.37  CREATININE 5.74*  --   --  6.75*  --   --   --   --   TROPONINI 0.05* 0.60* 1.44*  --   --   --   --   --    < > = values in this interval not displayed.    Estimated Creatinine Clearance: 9.7 mL/min (A) (by C-G formula based on SCr of 6.75 mg/dL (H)).   Medical History: Past Medical History:  Diagnosis Date  . Anemia of chronic disease   . Atherosclerosis   . Atrial fibrillation with rapid ventricular response (HCC)    chadsvasc at least 6; new onset versus paroxysmal with first or most recent episode on 09/10/18  . CAD (coronary artery disease)    2017 LHC, severe 1v disease, occluded pRCA, L-R collaterals, moderate LV dysfunction EF 30-35  . Diabetes mellitus with complication ([place])   . End stage renal disease on dialysis ([place])    Labile BP  . History of ventricular tachycardia    2017 polymorphic VT and cardiac arrest s/p angioplasty   . Hypercholesteremia   . Hyperlipidemia LDL goal <70   . Hypertension   . Labile blood pressure    on HD  . NSVT (nonsustained ventricular tachycardia) ([place])    08/2018 NSVT  . Peripheral arterial occlusive disease (HCC)    s/p L BKA, R AKA  . S/P  AKA (above knee amputation) unilateral, right ([place])    08/2018  . S/P BKA (below knee amputation) unilateral, left ([place])    2015  . Stroke ([place])   . Systolic heart failure ([place])    2017 EF per LHC 30-35%    Assessment: Pharmacy consulted for heparin drip management for 57 yo male HD patient admitted for atrial fibrillation with RVR. Patient s/p right AKA on 11/26. Patient previously ordered heparin drip for limb ischemia prior to AKA with last rate 2550 units/hr. Patient with previous left BKA. Per CCM/Vascular surgery patient is to receive no heparin boluses.  Per Cardiology, patient to transition to apixaban once cleared of any additional surgeries or procedures.  Goal of Therapy:  Heparin level 0.3-0.7 units/ml Monitor platelets by anticoagulation protocol: Yes   Plan:  HL 0.37 therapeutic. Continue heparin drip at 2250 units/hr. Heparin  level ordered for 2100 to confirm.  CBC with morning labs.  Tawnya Crook, PharmD Pharmacy Resident  09/12/2018 3:04 PM

## 2018-09-12 NOTE — Progress Notes (Signed)
Pre HD assessment    09/12/18 0947  Neurological  Level of Consciousness Alert  Orientation Level Oriented X4  Respiratory  Respiratory Pattern Regular;Unlabored  Chest Assessment Chest expansion symmetrical  Cardiac  ECG Monitor Yes  Cardiac Rhythm NSR  Vascular  R Radial Pulse +2  L Radial Pulse +2  Edema Generalized;Right lower extremity;Left lower extremity  Integumentary  Integumentary (WDL) X  Skin Color Appropriate for ethnicity  Musculoskeletal  Musculoskeletal (WDL) X  Generalized Weakness Yes  Assistive Device None  GU Assessment  Genitourinary (WDL) X  Genitourinary Symptoms  (HD)  Psychosocial  Psychosocial (WDL) WDL

## 2018-09-12 NOTE — Progress Notes (Signed)
Post HD assessment. Pt tolerated tx well without c/o or complication. Net UF 1036, goal met.    09/12/18 1344  Vital Signs  Temp 98.6 F (37 C)  Temp Source Oral  Pulse Rate 72  Pulse Rate Source Monitor  Resp 19  BP 113/65  BP Location Right Arm  BP Method Automatic  Patient Position (if appropriate) Lying  Oxygen Therapy  SpO2 94 %  O2 Device Room Air  Dialysis Weight  Weight 103.7 kg  Type of Weight Post-Dialysis  Post-Hemodialysis Assessment  Rinseback Volume (mL) 250 mL  KECN 78.2 V  Dialyzer Clearance Lightly streaked  Duration of HD Treatment -hour(s) 3.5 hour(s)  Hemodialysis Intake (mL) 500 mL  UF Total -Machine (mL) 1536 mL  Net UF (mL) 1036 mL  Tolerated HD Treatment Yes  AVG/AVF Arterial Site Held (minutes) 10 minutes  AVG/AVF Venous Site Held (minutes) 10 minutes  Education / Care Plan  Dialysis Education Provided Yes  Documented Education in Care Plan Yes  Fistula / Graft Left Forearm Arteriovenous fistula  No Placement Date or Time found.   Placed prior to admission: Yes  Orientation: Left  Access Location: Forearm  Access Type: Arteriovenous fistula  Site Condition No complications  Fistula / Graft Assessment Present;Thrill;Bruit  Status Deaccessed  Drainage Description None

## 2018-09-12 NOTE — Progress Notes (Signed)
Pre HD assessment    09/12/18 0946  Vital Signs  Temp 98 F (36.7 C)  Temp Source Oral  Pulse Rate 72  Pulse Rate Source Monitor  Resp 18  BP 133/76  BP Location Right Arm  BP Method Automatic  Patient Position (if appropriate) Lying  Oxygen Therapy  SpO2 96 %  O2 Device Room Air  Pain Assessment  Pain Scale 0-10  Pain Score 0  Dialysis Weight  Weight 104.5 kg  Type of Weight Pre-Dialysis  Time-Out for Hemodialysis  What Procedure? HD  Pt Identifiers(min of two) First/Last Name;MRN/Account#  Correct Site? Yes  Correct Side? Yes  Correct Procedure? Yes  Consents Verified? Yes  Rad Studies Available? N/A  Safety Precautions Reviewed? Yes  Research scientist (physical sciences)  (1A)  Station Number 1  UF/Alarm Test Passed  Conductivity: Meter 14  Conductivity: Machine  14.2  pH 7.6  Reverse Osmosis main  Normal Saline Lot Number [id number]  Dialyzer Lot Number [id number]  Disposable Set Lot Number [id number]  Machine Temperature 98.6 F (37 C)  Musician and Audible Yes  Blood Lines Intact and Secured Yes  Pre Treatment Patient Checks  Vascular access used during treatment Fistula  Hepatitis B Surface Antigen Results Negative  Date Hepatitis B Surface Antigen Drawn 08/13/18  Hepatitis B Surface Antibody  (>10)  Date Hepatitis B Surface Antibody Drawn 08/13/18  Hemodialysis Consent Verified Yes  Hemodialysis Standing Orders Initiated Yes  ECG (Telemetry) Monitor On Yes  Prime Ordered Normal Saline  Length of  DialysisTreatment -hour(s) 3.5 Hour(s)  Dialyzer Elisio 17H NR  Dialysate 2K, 2.5 Ca  Dialysis Anticoagulant None  Dialysate Flow Ordered 800  Blood Flow Rate Ordered 400 mL/min  Ultrafiltration Goal 1 Liters  Pre Treatment Labs CBC;Other (Comment) (Heparin level)  Dialysis Blood Pressure Support Ordered Normal Saline  Education / Care Plan  Dialysis Education Provided Yes  Documented Education in Care Plan Yes  Fistula / Graft Left Forearm  Arteriovenous fistula  No Placement Date or Time found.   Placed prior to admission: Yes  Orientation: Left  Access Location: Forearm  Access Type: Arteriovenous fistula  Site Condition No complications  Fistula / Graft Assessment Present;Thrill;Bruit  Drainage Description None

## 2018-09-12 NOTE — Progress Notes (Signed)
Patient's R.L.E. stump dressing changed at this time, as ordered. Patient tolerated fairly. Used 2 4x4's and 2 rolls of Kerlix. Patient's wife states, "you got to use brown tape", which I'm assuming means ACE wrap. Informed her I don't have an order to use any ACE wrap. Taped dressing to patient's leg to attempt to ensure better adherence overnight. Will continue to monitor wound dressing status, and pass along in shift report. W[facility]

## 2018-09-12 NOTE — Progress Notes (Signed)
Progress Note  Patient Name: [name] Date of Encounter: 09/12/2018  Primary Cardiologist: Kathlyn Sacramento, MD   Subjective   He continues to be in sinus rhythm on amiodarone drip.  He denies chest pain or shortness of breath.  He continues to complain of pain at the right stump site.  Inpatient Medications    Scheduled Meds: . amiodarone  400 mg Oral BID  . aspirin EC  81 mg Oral Daily  . cinacalcet  60 mg Oral Once per day on Mon Wed Fri  . epoetin (EPOGEN/PROCRIT) injection  4,000 Units Intravenous Q M,W,F-HD  . feeding supplement (NEPRO CARB STEADY)  237 mL Oral BID BM  . ferrous sulfate  325 mg Oral TID WC  . insulin aspart  0-9 Units Subcutaneous TID WC  . metoprolol tartrate  25 mg Oral BID  . midodrine  5 mg Oral TID WC  . multivitamin  1 tablet Oral QHS  . multivitamin-lutein  1 capsule Oral Daily  . pantoprazole  40 mg Oral BID  . polyethylene glycol  17 g Oral BID  . senna-docusate  1 tablet Oral BID  . sevelamer carbonate  2,400 mg Oral TID WC  . sodium zirconium cyclosilicate  10 g Oral Daily  . vitamin C  250 mg Oral BID   Continuous Infusions: . sodium chloride Stopped (09/10/18 0232)  . small volume/piggyback builder 50 mL/hr at 09/11/18 2231  . heparin 2,250 Units/hr (09/12/18 0540)  . piperacillin-tazobactam (ZOSYN)  IV 3.375 g (09/12/18 0103)   PRN Meds: sodium chloride, acetaminophen, docusate sodium, HYDROmorphone (DILAUDID) injection **OR** HYDROmorphone (DILAUDID) injection, lidocaine-prilocaine, ondansetron (ZOFRAN) IV, oxyCODONE   Vital Signs    Vitals:   09/11/18 1412 09/11/18 1945 09/12/18 0547 09/12/18 0757  BP: 122/69 113/62 (!) 101/50 (!) 123/45  Pulse: 72 64 69 69  Resp: 19 18 18 20   Temp: 98.4 F (36.9 C) 98.5 F (36.9 C) 98.5 F (36.9 C) 99.1 F (37.3 C)  TempSrc: Oral Oral  Oral  SpO2: 94% 91% 93% 95%  Weight:   101.7 kg   Height:        Intake/Output Summary (Last 24 hours) at 09/12/2018 0941 Last data filed at  09/11/2018 2231 Gross per 24 hour  Intake 796.01 ml  Output -  Net 796.01 ml   Filed Weights   09/10/18 1120 09/10/18 1158 09/12/18 0547  Weight: 105.7 kg 103.8 kg 101.7 kg    Telemetry    Normal sinus rhythm with- Personally Reviewed  ECG     - Personally Reviewed  Physical Exam   GEN: No acute distress.   Neck: No JVD Cardiac: RRR, no murmurs, rubs, or gallops.  Respiratory: Clear to auscultation bilaterally. GI: Soft, nontender, non-distended  MS: No edema; No deformity. Neuro:  Nonfocal  Psych: Normal affect  Bilateral leg amputation  Labs    Chemistry Recent Labs  Lab 09/05/18 1202  09/09/18 0552 09/10/18 1256 09/10/18 1803 09/11/18 0412  NA 135   < > 139 138  --  137  K 5.8*   < > 4.8 4.2  --  4.6  CL 96*   < > 94* 94*  --  91*  CO2 28   < > 32 27  --  29  GLUCOSE 183*   < > 186* 233*  --  193*  BUN 45*   < > 44* 33*  --  42*  CREATININE 7.45*   < > 7.55* 5.74*  --  6.75*  CALCIUM 8.7*   < > 9.6 9.3  --  9.3  PROT  --   --   --   --  6.5  --   ALBUMIN 3.5  --   --   --  2.8*  --   AST  --   --   --   --  22  --   ALT  --   --   --   --  10  --   ALKPHOS  --   --   --   --  117  --   BILITOT  --   --   --   --  0.5  --   GFRNONAA 7*   < > 7* 10*  --  8*  GFRAA 8*   < > 8* 12*  --  10*  ANIONGAP 11   < > 13 17*  --  17*   < > = values in this interval not displayed.     Hematology Recent Labs  Lab 09/10/18 1256 09/11/18 0412 09/12/18 0257  WBC 13.3* 13.5* 13.2*  RBC 3.32* 2.89* 2.90*  HGB 9.7* 8.4* 8.5*  HCT 32.3* 28.2* 27.8*  MCV 97.3 97.6 95.9  MCH 29.2 29.1 29.3  MCHC 30.0 29.8* 30.6  RDW 16.3* 16.3* 16.3*  PLT 295 300 348    Cardiac Enzymes Recent Labs  Lab 09/10/18 1256 09/10/18 1803 09/10/18 2321  TROPONINI 0.05* 0.60* 1.44*   No results for input(s): TROPIPOC in the last 168 hours.   BNPNo results for input(s): BNP, PROBNP in the last 168 hours.   DDimer No results for input(s): DDIMER in the last 168 hours.    Radiology    No results found.  Cardiac Studies   Echocardiogram November 27:  - Procedure narrative: Transthoracic echocardiography for left   ventricular function evaluation, for right ventricular function   evaluation, and for assessment of valvular function. Image   quality was suboptimal. The study was technically difficult. - Left ventricle: The cavity size was normal. Wall thickness was   increased in a pattern of severe LVH. Systolic function was   vigorous. The estimated ejection fraction was in the range of 65%   to 70%, though atrial fibrillation with rapid ventricular   response limits evaluation. - Mitral valve: Calcified annulus. - Left atrium: The atrium was mildly dilated. - Right ventricle: The cavity size was normal. Systolic function   was normal.  Patient Profile     57 y.o. male with known history of coronary artery disease with CTO of the right coronary artery with left-to-right collaterals, peripheral arterial disease status post left BKA and recent AKA, end-stage renal disease on hemodialysis, CVA and multiple other comorbidities listed above who developed atrial fibrillation with rapid ventricular response during dialysis yesterday.  Assessment & Plan    1.  Atrial fibrillation with rapid ventricular response: He converted to sinus rhythm with amiodarone drip .  I am switching this to amiodarone 400 mg by mouth twice daily for 1 week followed by 200 mg once daily.   His chads vas score is at least 6.  He is currently on unfractionated heparin.  Once we determine he does not need any more surgeries or procedures, I recommend switching him to Eliquis 2.5 mg twice daily.  I recommend this dose over the 5 mg twice daily dose given that he is on dialysis and his weight is close to 60 kg.  He also has baseline anemia with a hemoglobin  of 8.5 and I am hesitant to put him on a full dose at the present time.    2.  History of chronic systolic heart failure:  Echocardiogram yesterday showed normal LV systolic function.  3.  Coronary artery disease with known occluded RCA with left-to-right collaterals: Continue medical therapy.  Slightly elevated troponin is due to supply demand ischemia.  4.  Peripheral arterial disease: Managed by vascular.       For questions or updates, please contact [place] Please consult [email or web address] for contact info under        Signed, Kathlyn Sacramento, MD  09/12/2018, 9:41 AM

## 2018-09-12 NOTE — Care Management (Signed)
Patient transferred out of ICU 11/28 to 2A. Patient had  above knee amputation on the right 11/26.  He has been on amiodarone drip for rapid atrial fib.  It was planned to transition him to oral today.  Requested physical therapy consult from attending.  Patient is now a bilateral amputee. Palliative has followed for goals

## 2018-09-13 LAB — CBC
HEMATOCRIT: 29.4 % — AB (ref 39.0–52.0)
HEMOGLOBIN: 8.8 g/dL — AB (ref 13.0–17.0)
MCH: 29.1 pg (ref 26.0–34.0)
MCHC: 29.9 g/dL — AB (ref 30.0–36.0)
MCV: 97.4 fL (ref 80.0–100.0)
PLATELETS: 376 10*3/uL (ref 150–400)
RBC: 3.02 MIL/uL — ABNORMAL LOW (ref 4.22–5.81)
RDW: 16.7 % — AB (ref 11.5–15.5)
WBC: 15 10*3/uL — ABNORMAL HIGH (ref 4.0–10.5)
nRBC: 0.5 % — ABNORMAL HIGH (ref 0.0–0.2)

## 2018-09-13 LAB — GLUCOSE, CAPILLARY
Glucose-Capillary: 146 mg/dL — ABNORMAL HIGH (ref 70–99)
Glucose-Capillary: 139 mg/dL — ABNORMAL HIGH (ref 70–99)

## 2018-09-13 LAB — HEPARIN LEVEL (UNFRACTIONATED): Heparin Unfractionated: <0.1 IU/mL — ABNORMAL LOW (ref 0.30–0.70)

## 2018-09-13 MED ORDER — APIXABAN 2.5 MG PO TABS
2.5000 mg | ORAL_TABLET | Freq: Two times a day (BID) | ORAL | Status: DC
  Administered 2018-09-13: 2.5 mg via ORAL
  Filled 2018-09-13: qty 1

## 2018-09-13 MED ORDER — APIXABAN 5 MG PO TABS
5.0000 mg | ORAL_TABLET | Freq: Two times a day (BID) | ORAL | Status: DC
  Administered 2018-09-13 – 2018-09-15 (×4): 5 mg via ORAL
  Filled 2018-09-13 (×4): qty 1

## 2018-09-13 MED ORDER — OXYCODONE HCL 5 MG PO TABS
5.0000 mg | ORAL_TABLET | ORAL | Status: DC | PRN
  Administered 2018-09-13: 10 mg via ORAL
  Filled 2018-09-13: qty 2

## 2018-09-13 MED ORDER — GABAPENTIN 100 MG PO CAPS
100.0000 mg | ORAL_CAPSULE | Freq: Two times a day (BID) | ORAL | Status: DC
  Administered 2018-09-13 – 2018-09-15 (×5): 100 mg via ORAL
  Filled 2018-09-13 (×5): qty 1

## 2018-09-13 NOTE — Progress Notes (Signed)
ANTICOAGULATION CONSULT NOTE  Pharmacy Consult for Heparin  Indication: atrial fibrillation  No Known Allergies  Patient Measurements: Height: 4\' 2"  (127 cm) Weight: 227 lb 1.6 oz (103 kg) IBW/kg (Calculated) : 27 Heparin Dosing Weight: 89  Vital Signs: Temp: 98.3 F (36.8 C) (11/30 0503) Temp Source: Oral (11/29 1948) BP: 126/69 (11/30 0503) Pulse Rate: 74 (11/30 0503)  Labs: Recent Labs    09/10/18 1256 09/10/18 1803 09/10/18 2321 09/11/18 0412  09/12/18 0257 09/12/18 1251 09/12/18 2100 09/13/18 0428  HGB 9.7*  --   --  8.4*  --  8.5*  --   --  8.8*  HCT 32.3*  --   --  28.2*  --  27.8*  --   --  29.4*  PLT 295  --   --  300  --  348  --   --  376  LABPROT  --  15.4*  --   --   --   --   --   --   --   INR  --  1.23  --   --   --   --   --   --   --   HEPARINUNFRC  --   --  <0.10*  --    < > 0.29* 0.37 0.36 <0.10*  CREATININE 5.74*  --   --  6.75*  --   --   --   --   --   TROPONINI 0.05* 0.60* 1.44*  --   --   --   --   --   --    < > = values in this interval not displayed.    Estimated Creatinine Clearance: 9.7 mL/min (A) (by C-G formula based on SCr of 6.75 mg/dL (H)).   Medical History: Past Medical History:  Diagnosis Date  . Anemia of chronic disease   . Atherosclerosis   . Atrial fibrillation with rapid ventricular response (HCC)    chadsvasc at least 6; new onset versus paroxysmal with first or most recent episode on 09/10/18  . CAD (coronary artery disease)    2017 LHC, severe 1v disease, occluded pRCA, L-R collaterals, moderate LV dysfunction EF 30-35  . Diabetes mellitus with complication ([place])   . End stage renal disease on dialysis ([place])    Labile BP  . History of ventricular tachycardia    2017 polymorphic VT and cardiac arrest s/p angioplasty   . Hypercholesteremia   . Hyperlipidemia LDL goal <70   . Hypertension   . Labile blood pressure    on HD  . NSVT (nonsustained ventricular tachycardia) ([place])    08/2018 NSVT  . Peripheral  arterial occlusive disease (HCC)    s/p L BKA, R AKA  . S/P AKA (above knee amputation) unilateral, right ([place])    08/2018  . S/P BKA (below knee amputation) unilateral, left ([place])    2015  . Stroke ([place])   . Systolic heart failure ([place])    2017 EF per LHC 30-35%    Assessment: Pharmacy consulted for heparin drip management for 57 yo male HD patient admitted for atrial fibrillation with RVR. Patient s/p right AKA on 11/26. Patient previously ordered heparin drip for limb ischemia prior to AKA with last rate 2550 units/hr. Patient with previous left BKA. Per CCM/Vascular surgery patient is to receive no heparin boluses.  Per Cardiology, patient to transition to apixaban once cleared of any additional surgeries or procedures.  Goal of Therapy:  Heparin level 0.3-0.7 units/ml Monitor platelets  by anticoagulation protocol: Yes   Plan:  HL 0.37 therapeutic. Continue heparin drip at 2250 units/hr. Heparin level ordered for 2100 to confirm.  11/29 2100 heparin level 0.36. Continue current regimen. Recheck heparin level and CBC with tomorrow AM labs.  11/30 AM heparin level <0.1. Per RN bag had run out but has been restarted. Will recheck this PM to ensure continued therapeutic level.  CBC with morning labs.  Yezenia Fredrick S, PharmD 09/13/2018 6:10 AM

## 2018-09-13 NOTE — Progress Notes (Signed)
ANTICOAGULATION CONSULT NOTE - Initial Consult  Pharmacy Consult for apixaban Indication: atrial fibrillation  No Known Allergies  Patient Measurements: Height: 4\' 2"  (127 cm) Weight: 227 lb 1.6 oz (103 kg) IBW/kg (Calculated) : 27 Heparin Dosing Weight:   Vital Signs: Temp: 98.3 F (36.8 C) (11/30 0820) BP: 127/52 (11/30 0820) Pulse Rate: 72 (11/30 0820)  Labs: Recent Labs    09/10/18 1803 09/10/18 2321  09/11/18 0412  09/12/18 0257 09/12/18 1251 09/12/18 2100 09/13/18 0428  HGB  --   --    < > 8.4*  --  8.5*  --   --  8.8*  HCT  --   --   --  28.2*  --  27.8*  --   --  29.4*  PLT  --   --   --  300  --  348  --   --  376  LABPROT 15.4*  --   --   --   --   --   --   --   --   INR 1.23  --   --   --   --   --   --   --   --   HEPARINUNFRC  --  <0.10*  --   --    < > 0.29* 0.37 0.36 <0.10*  CREATININE  --   --   --  6.75*  --   --   --   --   --   TROPONINI 0.60* 1.44*  --   --   --   --   --   --   --    < > = values in this interval not displayed.    Estimated Creatinine Clearance: 9.7 mL/min (A) (by C-G formula based on SCr of 6.75 mg/dL (H)).   Medical History: Past Medical History:  Diagnosis Date  . Anemia of chronic disease   . Atherosclerosis   . Atrial fibrillation with rapid ventricular response (HCC)    chadsvasc at least 6; new onset versus paroxysmal with first or most recent episode on 09/10/18  . CAD (coronary artery disease)    2017 LHC, severe 1v disease, occluded pRCA, L-R collaterals, moderate LV dysfunction EF 30-35  . Diabetes mellitus with complication ([place])   . End stage renal disease on dialysis ([place])    Labile BP  . History of ventricular tachycardia    2017 polymorphic VT and cardiac arrest s/p angioplasty   . Hypercholesteremia   . Hyperlipidemia LDL goal <70   . Hypertension   . Labile blood pressure    on HD  . NSVT (nonsustained ventricular tachycardia) ([place])    08/2018 NSVT  . Peripheral arterial occlusive disease (HCC)     s/p L BKA, R AKA  . S/P AKA (above knee amputation) unilateral, right ([place])    08/2018  . S/P BKA (below knee amputation) unilateral, left ([place])    2015  . Stroke ([place])   . Systolic heart failure ([place])    2017 EF per LHC 30-35%    Medications:  Infusions:  . sodium chloride Stopped (09/10/18 0232)  . small volume/piggyback builder 50 mL/hr at 09/11/18 2231    Assessment: 14 yom admitted for AF RVR and s/p right AKA 11/26. Today patient is to be started on apixaban per cardiology for AF. PMH includes ESRD HD. Pharmacy consulted to confirm apixaban dose.  Goal of Therapy:  Monitor platelets by anticoagulation protocol: Yes   Plan:  Patient only meets one  criteria for apixaban dose reduction in AF. Increase apixaban to 5 mg po BID. SCR/CBC Q3D per protocol.   Laural Benes, Pharm.D., BCPS Clinical Pharmacist 09/13/2018,1:32 PM

## 2018-09-13 NOTE — Progress Notes (Signed)
Use translation services EVERY time you enter the room please for MD and social work, every person providing care or consults etc.  Patient & family requested it.  They do not understand [place].  The computer translator is on our unit at the nurses' station.  Phillis Knack, RN

## 2018-09-13 NOTE — Progress Notes (Signed)
[facility]  ROUNDING NOTE   Subjective:   Patient did well with hemodialysis yesterday No further reports of arrhythmia Denies any acute complaints Able to eat without nausea or vomiting    Objective:  Vital signs in last 24 hours:  Temp:  [97.7 F (36.5 C)-98.6 F (37 C)] 98.3 F (36.8 C) (11/30 0820) Pulse Rate:  [70-74] 72 (11/30 0820) Resp:  [12-22] 18 (11/30 0820) BP: (98-127)/(52-72) 127/52 (11/30 0820) SpO2:  [89 %-98 %] 93 % (11/30 0820) Weight:  [103 kg-103.7 kg] 103 kg (11/29 1458)  Weight change: 2.804 kg Filed Weights   09/12/18 0946 09/12/18 1344 09/12/18 1458  Weight: 104.5 kg 103.7 kg 103 kg    Intake/Output: I/O last 3 completed shifts: In: 217.6 [I.V.:207; IV Piggyback:10.6] Out: 1036 [Other:1036]   Intake/Output this shift:  No intake/output data recorded.  Physical Exam: General: NAD,   Head: Normocephalic, atraumatic. Moist oral mucosal membranes  Eyes: Anicteric,   Neck: Supple,   Lungs:  Clear to auscultation  Heart:  Regular with ectopic beats, 2/6 murmur  Abdomen:  Soft, nontender,   Extremities:  left BKA, right AKA  Neurologic: Nonfocal, able to answer questions  Skin: No lesions  Access:  left arm AVF    Basic Metabolic Panel: Recent Labs  Lab 09/08/18 0433 09/08/18 1405 09/09/18 0552 09/10/18 0552 09/10/18 1256 09/11/18 0412  NA 133*  --  139  --  138 137  K 6.0* 4.9 4.8  --  4.2 4.6  CL 91*  --  94*  --  94* 91*  CO2 26  --  32  --  27 29  GLUCOSE 199*  --  186*  --  233* 193*  BUN 56*  --  44*  --  33* 42*  CREATININE 9.61*  --  7.55*  --  5.74* 6.75*  CALCIUM 8.9  --  9.6  --  9.3 9.3  MG 2.5*  --  2.3 2.6*  --   --   PHOS  --   --   --  8.0*  --   --     Liver Function Tests: Recent Labs  Lab 09/10/18 1803  AST 22  ALT 10  ALKPHOS 117  BILITOT 0.5  PROT 6.5  ALBUMIN 2.8*   No results for input(s): LIPASE, AMYLASE in the last 168 hours. No results for input(s): AMMONIA in the last 168  hours.  CBC: Recent Labs  Lab 09/10/18 0413 09/10/18 1256 09/11/18 0412 09/12/18 0257 09/13/18 0428  WBC 18.1* 13.3* 13.5* 13.2* 15.0*  NEUTROABS  --  10.7*  --   --   --   HGB 9.1* 9.7* 8.4* 8.5* 8.8*  HCT 30.3* 32.3* 28.2* 27.8* 29.4*  MCV 97.1 97.3 97.6 95.9 97.4  PLT 302 295 300 348 376    Cardiac Enzymes: Recent Labs  Lab 09/10/18 1256 09/10/18 1803 09/10/18 2321  TROPONINI 0.05* 0.60* 1.44*    BNP: Invalid input(s): POCBNP  CBG: Recent Labs  Lab 09/11/18 0740 09/11/18 1143 09/11/18 2130 09/12/18 2112 09/13/18 0821  GLUCAP 186* 251* 147* 144* 146*    Microbiology: Results for orders placed or performed during the hospital encounter of 08/12/18  MRSA PCR Screening     Status: None   Collection Time: 08/13/18  9:08 AM  Result Value Ref Range Status   MRSA by PCR NEGATIVE NEGATIVE Final    Comment:        The GeneXpert MRSA Assay (FDA approved for NASAL specimens only),  is one component of a comprehensive MRSA colonization surveillance program. It is not intended to diagnose MRSA infection nor to guide or monitor treatment for MRSA infections. Performed at [facility], [address] [place], [place] [address]     Coagulation Studies: Recent Labs    09/10/18 1803  LABPROT 15.4*  INR 1.23    Urinalysis: No results for input(s): COLORURINE, LABSPEC, PHURINE, GLUCOSEU, HGBUR, BILIRUBINUR, KETONESUR, PROTEINUR, UROBILINOGEN, NITRITE, LEUKOCYTESUR in the last 72 hours.  Invalid input(s): APPERANCEUR    Imaging: No results found.   Medications:   . sodium chloride Stopped (09/10/18 0232)  . small volume/piggyback builder 50 mL/hr at 09/11/18 2231   . amiodarone  400 mg Oral BID  . apixaban  2.5 mg Oral BID  . aspirin EC  81 mg Oral Daily  . cinacalcet  60 mg Oral Once per day on Mon Wed Fri  . epoetin (EPOGEN/PROCRIT) injection  4,000 Units Intravenous Q M,W,F-HD  . feeding supplement (NEPRO CARB STEADY)  237 mL Oral BID  BM  . ferrous sulfate  325 mg Oral TID WC  . gabapentin  100 mg Oral BID  . insulin aspart  0-9 Units Subcutaneous TID WC  . metoprolol tartrate  25 mg Oral BID  . midodrine  5 mg Oral TID WC  . multivitamin  1 tablet Oral QHS  . multivitamin-lutein  1 capsule Oral Daily  . pantoprazole  40 mg Oral BID  . polyethylene glycol  17 g Oral BID  . senna-docusate  1 tablet Oral BID  . sevelamer carbonate  2,400 mg Oral TID WC  . vitamin C  250 mg Oral BID   sodium chloride, acetaminophen, docusate sodium, lidocaine-prilocaine, ondansetron (ZOFRAN) IV, oxyCODONE  Assessment/ Plan:  [name] is a 57 y.o. Hispanic male with end stage renal disease, peripheral vascular disease, hypertension, CVA, hyperlipidemia, diabetes mellitus type II who is admitted to [facility] 103 kg Left AVF  1. End Stage Renal Disease:  MWF schedule. With hyperkalemia Next routine hemodialysis to be scheduled on Monday Estimated dry weight will need to be adjusted because of amputation Currently 103 kg     2. Anemia of chronic kidney disease: hemoglobin 8.8 - low dose EPO scheduled with HD  3. Hypotension - Patient takes midodrine before his dialysis treatments  4. Secondary Hyperparathyroidism:  - sevelamer and cinacalcet with meals.   5. Peripheral arterial disease: Underwent right above-the-knee amputation November 26 by Dr. Lucky Cowboy  6. Diabetes mellitus type II with chronic kidney disease: Continue glucose control.    LOS: [place] 11/30/201912:38 PM

## 2018-09-13 NOTE — Progress Notes (Signed)
Progress Note  Patient Name: [name] Date of Encounter: 09/13/2018  Primary Cardiologist: Kathlyn Sacramento, MD   Subjective   57 year old gentleman with a history of diabetes mellitus, end-stage renal disease, severe peripheral vascular disease.  We are asked to see him for an episode of atrial fibrillation following leg amputation   He has converted to sinus rhythm on amiodarone. He has been started on Eliquis 2.5 mg twice a day.    Inpatient Medications    Scheduled Meds: . amiodarone  400 mg Oral BID  . apixaban  2.5 mg Oral BID  . aspirin EC  81 mg Oral Daily  . cinacalcet  60 mg Oral Once per day on Mon Wed Fri  . epoetin (EPOGEN/PROCRIT) injection  4,000 Units Intravenous Q M,W,F-HD  . feeding supplement (NEPRO CARB STEADY)  237 mL Oral BID BM  . ferrous sulfate  325 mg Oral TID WC  . gabapentin  100 mg Oral BID  . insulin aspart  0-9 Units Subcutaneous TID WC  . metoprolol tartrate  25 mg Oral BID  . midodrine  5 mg Oral TID WC  . multivitamin  1 tablet Oral QHS  . multivitamin-lutein  1 capsule Oral Daily  . pantoprazole  40 mg Oral BID  . polyethylene glycol  17 g Oral BID  . senna-docusate  1 tablet Oral BID  . sevelamer carbonate  2,400 mg Oral TID WC  . vitamin C  250 mg Oral BID   Continuous Infusions: . sodium chloride Stopped (09/10/18 0232)  . small volume/piggyback builder 50 mL/hr at 09/11/18 2231   PRN Meds: sodium chloride, acetaminophen, docusate sodium, lidocaine-prilocaine, ondansetron (ZOFRAN) IV, oxyCODONE   Vital Signs    Vitals:   09/12/18 1528 09/12/18 1948 09/13/18 0503 09/13/18 0820  BP: 111/66 114/65 126/69 (!) 127/52  Pulse: 73 71 74 72  Resp: 18 16 16 18   Temp: 98.3 F (36.8 C) 97.7 F (36.5 C) 98.3 F (36.8 C) 98.3 F (36.8 C)  TempSrc:  Oral    SpO2: 98% (!) 89% 91% 93%  Weight:      Height:        Intake/Output Summary (Last 24 hours) at 09/13/2018 1303 Last data filed at 09/12/2018 1843 Gross per 24 hour    Intake 0 ml  Output 1036 ml  Net -1036 ml   Filed Weights   09/12/18 0946 09/12/18 1344 09/12/18 1458  Weight: 104.5 kg 103.7 kg 103 kg    Telemetry    NSR - Personally Reviewed  ECG     NSR  - Personally Reviewed  Physical Exam   GEN: No acute distress.   Neck: No JVD Cardiac: RRR, no murmurs, rubs, or gallops.  Respiratory: Clear to auscultation bilaterally. GI: Soft, nontender, non-distended  MS:  s/p bilateral AKAs  Neuro:  Nonfocal  Psych: Normal affect   Labs    Chemistry Recent Labs  Lab 09/09/18 0552 09/10/18 1256 09/10/18 1803 09/11/18 0412  NA 139 138  --  137  K 4.8 4.2  --  4.6  CL 94* 94*  --  91*  CO2 32 27  --  29  GLUCOSE 186* 233*  --  193*  BUN 44* 33*  --  42*  CREATININE 7.55* 5.74*  --  6.75*  CALCIUM 9.6 9.3  --  9.3  PROT  --   --  6.5  --   ALBUMIN  --   --  2.8*  --   AST  --   --  22  --   ALT  --   --  10  --   ALKPHOS  --   --  117  --   BILITOT  --   --  0.5  --   GFRNONAA 7* 10*  --  8*  GFRAA 8* 12*  --  10*  ANIONGAP 13 17*  --  17*     Hematology Recent Labs  Lab 09/11/18 0412 09/12/18 0257 09/13/18 0428  WBC 13.5* 13.2* 15.0*  RBC 2.89* 2.90* 3.02*  HGB 8.4* 8.5* 8.8*  HCT 28.2* 27.8* 29.4*  MCV 97.6 95.9 97.4  MCH 29.1 29.3 29.1  MCHC 29.8* 30.6 29.9*  RDW 16.3* 16.3* 16.7*  PLT 300 348 376    Cardiac Enzymes Recent Labs  Lab 09/10/18 1256 09/10/18 1803 09/10/18 2321  TROPONINI 0.05* 0.60* 1.44*   No results for input(s): TROPIPOC in the last 168 hours.   BNPNo results for input(s): BNP, PROBNP in the last 168 hours.   DDimer No results for input(s): DDIMER in the last 168 hours.   Radiology    No results found.  Cardiac Studies      Patient Profile     57 y.o. male  With PAF following right AKA surgery   Assessment & Plan    1.  Paroxysmal atrial fibrillation: The patient is converted to sinus rhythm. He will probably need Eliquis 5 mg twice a day.  I will verify with  pharmacy. Doing well on amiodarone.  Continue amiodarone at 400 mg twice a day.  We will reduce the dose to 200 mg twice a day after about a week.  Following.      For questions or updates, please contact [place] Please consult [email or web address] for contact info under        Signed, Mertie Moores, MD  09/13/2018, 1:03 PM

## 2018-09-13 NOTE — Progress Notes (Signed)
The blood sugar measurements have not been uploading to the computer.  Insulin was given each time based on new readings.  Translation ipad was used for each med pass and each RN encounter.  Translation pad was used by case management and physicians today.  The patient and the family are happy and want to continue to use the translation services.  Phillis Knack, RN

## 2018-09-13 NOTE — Progress Notes (Signed)
[facility] [place] NAME: [name]    MR#:  [id number]  DATE OF BIRTH:  1961/01/18  SUBJECTIVE:   Pain surgical site.  No SOB, abd pain  Spanish interpreter used  REVIEW OF SYSTEMS:    Review of Systems  Constitutional: Negative for fever, chills weight loss HENT: Negative for ear pain, nosebleeds, congestion, facial swelling, rhinorrhea, neck pain, neck stiffness and ear discharge.   Respiratory: Negative for cough, shortness of breath, wheezing  Cardiovascular: Negative for chest pain, palpitations and leg swelling.  Gastrointestinal: Negative for heartburn, abdominal pain, vomiting, diarrhea or consitpation Genitourinary: Negative for dysuria, urgency, frequency, hematuria Musculoskeletal: Pain RLE at post op site Neurological: Negative for dizziness, seizures, syncope, focal weakness,  numbness and headaches.  Hematological: Does not bruise/bleed easily.  Psychiatric/Behavioral: Negative for hallucinations, confusion, dysphoric mood  Tolerating Diet: yes  DRUG ALLERGIES:  No Known Allergies  VITALS:  Blood pressure (!) 127/52, pulse 72, temperature 98.3 F (36.8 C), resp. rate 18, height 4\' 2"  (1.27 m), weight 103 kg, SpO2 93 %.  PHYSICAL EXAMINATION:  Constitutional: Bees. No distress. HENT: Normocephalic. Marland Kitchen Oropharynx is clear and moist.  Eyes: Conjunctivae and EOM are normal. PERRLA, no scleral icterus.  Neck: Normal ROM. Neck supple. No JVD.  CVS: S1, S2,  no murmurs, no gallops, no carotid bruit.  Pulmonary: Effort and breath sounds normal, no stridor, rhonchi, wheezes, rales.  Abdominal: Soft. BS +,  no distension, tenderness, rebound or guarding.  Musculoskeletal: Right AKA dressed. Left BKA stump Neuro: Alert. CN 2-12 grossly intact. No focal deficits. Skin: Skin is warm and dry. No rash noted. Psychiatric: Normal mood and affect.   LABORATORY PANEL:   CBC Recent Labs  Lab 09/13/18 0428  WBC 15.0*  HGB  8.8*  HCT 29.4*  PLT 376   ------------------------------------------------------------------------------------------------------------------  Chemistries  Recent Labs  Lab 09/10/18 0552  09/10/18 1803 09/11/18 0412  NA  --    < >  --  137  K  --    < >  --  4.6  CL  --    < >  --  91*  CO2  --    < >  --  29  GLUCOSE  --    < >  --  193*  BUN  --    < >  --  42*  CREATININE  --    < >  --  6.75*  CALCIUM  --    < >  --  9.3  MG 2.6*  --   --   --   AST  --   --  22  --   ALT  --   --  10  --   ALKPHOS  --   --  117  --   BILITOT  --   --  0.5  --    < > = values in this interval not displayed.   ------------------------------------------------------------------------------------------------------------------  Cardiac Enzymes Recent Labs  Lab 09/10/18 1256 09/10/18 1803 09/10/18 2321  TROPONINI 0.05* 0.60* 1.44*   ------------------------------------------------------------------------------------------------------------------  RADIOLOGY:  No results found.   ASSESSMENT AND PLAN:   57 year old male with end-stage renal disease on dialysis presented with early ischemic changes in the right lower extremity.  * AFIB with RVR On amiodarone and heparin Appreciate cardiology consult In NSR today Changed to PO amiodarone. Stop heparin today. 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 Wound looks clean per vascular eval. Stopped abx.  * End-stage renal disease on hemodialysis: Management as per nephrology  * Chronic systolic and diastolic heart failure  No signs of fluid overload  * Elevated troponin with CAD Likely demand ischemia from CTO of RCA. status post cardiac catheterization in 2017 showing  Ost LM to LM lesion, 10% stenosed.  Ost RCA to Prox RCA lesion, 100% stenosed.  Continue aspirin Due to low blood pressure he is not on  hypertensive medications.   Management plans discussed with the patient and he is in agreement.  CODE STATUS: Full code   TOTAL TIME TAKING CARE OF THIS PATIENT: 35 minutes.   POSSIBLE D/C 1-2 days, DEPENDING ON CLINICAL CONDITION.   Leia Alf Dashan Chizmar M.D on 09/13/2018 at 9:42 AM  Between 7am to 6pm - Pager - [phone]  After 6pm go to [email or web address] - password [email or web address] [facility]  Office  [phone]  CC: Primary care physician; [name], MD  Note: This dictation was prepared with Dragon dictation along with smaller phrase technology. Any transcriptional errors that result from this process are unintentional.

## 2018-09-13 NOTE — Progress Notes (Signed)
    Subjective  - POD #4, s/p R AKA  Pain is better   Physical Exam:  Dressing removed.  Wound is healing nicely.  Minimal drainage, no erythema       Assessment/Plan:  POD #4  Spoke with patient and family via [place] interpreter.  His pain is much improved.  Stable from vascular perspective.  Will need staples removed in 3-4 weeks.  Family wants to speak with case manager for discharge assistance.  Wells Brabham 09/13/2018 3:46 PM --  Vitals:   09/13/18 0503 09/13/18 0820  BP: 126/69 (!) 127/52  Pulse: 74 72  Resp: 16 18  Temp: 98.3 F (36.8 C) 98.3 F (36.8 C)  SpO2: 91% 93%    Intake/Output Summary (Last 24 hours) at 09/13/2018 1546 Last data filed at 09/12/2018 1843 Gross per 24 hour  Intake 0 ml  Output 0 ml  Net 0 ml     Laboratory CBC    Component Value Date/Time   WBC 15.0 (H) 09/13/2018 0428   HGB 8.8 (L) 09/13/2018 0428   HCT 29.4 (L) 09/13/2018 0428   PLT 376 09/13/2018 0428    BMET    Component Value Date/Time   NA 137 09/11/2018 0412   K 4.6 09/11/2018 0412   K 6.6 (HH) 03/10/2013 1351   CL 91 (L) 09/11/2018 0412   CO2 29 09/11/2018 0412   GLUCOSE 193 (H) 09/11/2018 0412   BUN 42 (H) 09/11/2018 0412   CREATININE 6.75 (H) 09/11/2018 0412   CALCIUM 9.3 09/11/2018 0412   GFRNONAA 8 (L) 09/11/2018 0412   GFRAA 10 (L) 09/11/2018 0412    COAG Lab Results  Component Value Date   INR 1.23 09/10/2018   INR 1.32 09/09/2018   INR 1.22 09/08/2018   No results found for: PTT  Antibiotics Anti-infectives (From admission, onward)   Start     Dose/Rate Route Frequency Ordered Stop   09/08/18 1400  piperacillin-tazobactam (ZOSYN) IVPB 3.375 g  Status:  Discontinued     3.375 g 12.5 mL/hr over 240 Minutes Intravenous Every 12 hours 09/08/18 1250 09/12/18 1220   09/08/18 1100  piperacillin-tazobactam (ZOSYN) IVPB 3.375 g  Status:  Discontinued     3.375 g 12.5 mL/hr over 240 Minutes Intravenous Every 12 hours 09/08/18 1025 09/08/18 1250     09/04/18 1400  ceFAZolin (ANCEF) IVPB 1 g/50 mL premix  Status:  Discontinued     1 g 100 mL/hr over 30 Minutes Intravenous  Once 09/04/18 1358 09/04/18 1728   09/04/18 0600  ceFAZolin (ANCEF) IVPB 1 g/50 mL premix    Note to Pharmacy:  Send with pt to OR   1 g 100 mL/hr over 30 Minutes Intravenous On call 09/03/18 1844 09/04/18 1612       V. Leia Alf, M.D. [facility] Office: [phone] Pager:  [phone]

## 2018-09-14 LAB — CBC
HCT: 29.2 % — ABNORMAL LOW (ref 39.0–52.0)
HEMOGLOBIN: 8.8 g/dL — AB (ref 13.0–17.0)
MCH: 28.9 pg (ref 26.0–34.0)
MCHC: 30.1 g/dL (ref 30.0–36.0)
MCV: 95.7 fL (ref 80.0–100.0)
Platelets: 410 10*3/uL — ABNORMAL HIGH (ref 150–400)
RBC: 3.05 MIL/uL — ABNORMAL LOW (ref 4.22–5.81)
RDW: 17 % — ABNORMAL HIGH (ref 11.5–15.5)
WBC: 14.2 10*3/uL — ABNORMAL HIGH (ref 4.0–10.5)
nRBC: 0.4 % — ABNORMAL HIGH (ref 0.0–0.2)

## 2018-09-14 LAB — GLUCOSE, CAPILLARY
Glucose-Capillary: 135 mg/dL — ABNORMAL HIGH (ref 70–99)
Glucose-Capillary: 178 mg/dL — ABNORMAL HIGH (ref 70–99)
Glucose-Capillary: 183 mg/dL — ABNORMAL HIGH (ref 70–99)

## 2018-09-14 LAB — CREATININE, SERUM
Creatinine, Ser: 7.18 mg/dL — ABNORMAL HIGH (ref 0.61–1.24)
GFR, EST AFRICAN AMERICAN: 9 mL/min — AB (ref >60)
GFR, EST NON AFRICAN AMERICAN: 8 mL/min — AB (ref >60)

## 2018-09-14 MED ORDER — SEVELAMER CARBONATE 800 MG PO TABS
800.0000 mg | ORAL_TABLET | Freq: Three times a day (TID) | ORAL | Status: DC
  Administered 2018-09-14 – 2018-09-15 (×4): 800 mg via ORAL
  Filled 2018-09-14 (×4): qty 1

## 2018-09-14 MED ORDER — AMIODARONE HCL 200 MG PO TABS
200.0000 mg | ORAL_TABLET | Freq: Two times a day (BID) | ORAL | Status: DC
  Administered 2018-09-14 – 2018-09-15 (×2): 200 mg via ORAL
  Filled 2018-09-14 (×2): qty 1

## 2018-09-14 NOTE — Progress Notes (Signed)
Patient received care by the help of an  interpreter [facility] with [place] # [phone]. Pt.  Voiced no complaint. Will continue to monitor.

## 2018-09-14 NOTE — Progress Notes (Signed)
[facility]  ROUNDING NOTE   Subjective:   Reports decreased appetite No SOB constipation  Objective:  Vital signs in last 24 hours:  Temp:  [97.8 F (36.6 C)-98.9 F (37.2 C)] 97.8 F (36.6 C) (12/01 0808) Pulse Rate:  [69-71] 69 (12/01 0808) Resp:  [16-20] 20 (12/01 0808) BP: (117-138)/(64-87) 138/87 (12/01 0808) SpO2:  [94 %-96 %] 96 % (12/01 0808) Weight:  [101.9 kg] 101.9 kg (12/01 0557)  Weight change: -2.6 kg Filed Weights   09/12/18 1344 09/12/18 1458 09/14/18 0557  Weight: 103.7 kg 103 kg 101.9 kg    Intake/Output: I/O last 3 completed shifts: In: 120 [P.O.:120] Out: 0    Intake/Output this shift:  No intake/output data recorded.  Physical Exam: General: NAD,   Head: Normocephalic, atraumatic. Moist oral mucosal membranes  Eyes: Anicteric,   Neck: Supple,   Lungs:  Clear to auscultation  Heart:  Regular with ectopic beats, 2/6 murmur  Abdomen:  Soft, nontender,   Extremities:  left BKA, right AKA  Neurologic: Nonfocal, able to answer questions  Skin: No lesions  Access:  left arm AVF    Basic Metabolic Panel: Recent Labs  Lab 09/08/18 0433 09/08/18 1405 09/09/18 0552 09/10/18 0552 09/10/18 1256 09/11/18 0412 09/14/18 0513  NA 133*  --  139  --  138 137  --   K 6.0* 4.9 4.8  --  4.2 4.6  --   CL 91*  --  94*  --  94* 91*  --   CO2 26  --  32  --  27 29  --   GLUCOSE 199*  --  186*  --  233* 193*  --   BUN 56*  --  44*  --  33* 42*  --   CREATININE 9.61*  --  7.55*  --  5.74* 6.75* 7.18*  CALCIUM 8.9  --  9.6  --  9.3 9.3  --   MG 2.5*  --  2.3 2.6*  --   --   --   PHOS  --   --   --  8.0*  --   --   --     Liver Function Tests: Recent Labs  Lab 09/10/18 1803  AST 22  ALT 10  ALKPHOS 117  BILITOT 0.5  PROT 6.5  ALBUMIN 2.8*   No results for input(s): LIPASE, AMYLASE in the last 168 hours. No results for input(s): AMMONIA in the last 168 hours.  CBC: Recent Labs  Lab 09/10/18 1256 09/11/18 0412 09/12/18 0257  09/13/18 0428 09/14/18 0513  WBC 13.3* 13.5* 13.2* 15.0* 14.2*  NEUTROABS 10.7*  --   --   --   --   HGB 9.7* 8.4* 8.5* 8.8* 8.8*  HCT 32.3* 28.2* 27.8* 29.4* 29.2*  MCV 97.3 97.6 95.9 97.4 95.7  PLT 295 300 348 376 410*    Cardiac Enzymes: Recent Labs  Lab 09/10/18 1256 09/10/18 1803 09/10/18 2321  TROPONINI 0.05* 0.60* 1.44*    BNP: Invalid input(s): POCBNP  CBG: Recent Labs  Lab 09/11/18 2130 09/12/18 2112 09/13/18 0821 09/13/18 2111 09/14/18 0813  GLUCAP 147* 144* 146* 46* 41*    Microbiology: Results for orders placed or performed during the hospital encounter of 08/12/18  MRSA PCR Screening     Status: None   Collection Time: 08/13/18  9:08 AM  Result Value Ref Range Status   MRSA by PCR NEGATIVE NEGATIVE Final    Comment:        The  GeneXpert MRSA Assay (FDA approved for NASAL specimens only), is one component of a comprehensive MRSA colonization surveillance program. It is not intended to diagnose MRSA infection nor to guide or monitor treatment for MRSA infections. Performed at [facility], [address] [place],  [address]     Coagulation Studies: No results for input(s): LABPROT, INR in the last 72 hours.  Urinalysis: No results for input(s): COLORURINE, LABSPEC, PHURINE, GLUCOSEU, HGBUR, BILIRUBINUR, KETONESUR, PROTEINUR, UROBILINOGEN, NITRITE, LEUKOCYTESUR in the last 72 hours.  Invalid input(s): APPERANCEUR    Imaging: No results found.   Medications:   . sodium chloride 500 mL (09/13/18 2129)  . small volume/piggyback builder 50 mL/hr at 09/13/18 2129   . amiodarone  200 mg Oral BID  . apixaban  5 mg Oral BID  . aspirin EC  81 mg Oral Daily  . cinacalcet  60 mg Oral Once per day on Mon Wed Fri  . epoetin (EPOGEN/PROCRIT) injection  4,000 Units Intravenous Q M,W,F-HD  . feeding supplement (NEPRO CARB STEADY)  237 mL Oral BID BM  . ferrous sulfate  325 mg Oral TID WC  . gabapentin  100 mg Oral BID  .  insulin aspart  0-9 Units Subcutaneous TID WC  . metoprolol tartrate  25 mg Oral BID  . midodrine  5 mg Oral TID WC  . multivitamin  1 tablet Oral QHS  . multivitamin-lutein  1 capsule Oral Daily  . pantoprazole  40 mg Oral BID  . polyethylene glycol  17 g Oral BID  . senna-docusate  1 tablet Oral BID  . sevelamer carbonate  2,400 mg Oral TID WC  . vitamin C  250 mg Oral BID   sodium chloride, acetaminophen, docusate sodium, lidocaine-prilocaine, ondansetron (ZOFRAN) IV, oxyCODONE  Assessment/ Plan:  [name] is a 57 y.o. Hispanic male with end stage renal disease, peripheral vascular disease, hypertension, CVA, hyperlipidemia, diabetes mellitus type II who is admitted to [facility]   MWF [place] ? kg Left AVF  1. End Stage Renal Disease:  MWF schedule.   Next routine hemodialysis to be scheduled for Monday Estimated dry weight will need to be adjusted because of amputation     2. Anemia of chronic kidney disease: hemoglobin 8.8 - low dose EPO scheduled with HD  3. Hypotension - Midodrine before his dialysis treatments  4. Secondary Hyperparathyroidism:  - sevelamer and cinacalcet with meals.  - reduce sevelamer dose as appetite is poor  5. Peripheral arterial disease: Underwent right above-the-knee amputation November 26 by Dr. Lucky Cowboy  6. Diabetes mellitus type II with chronic kidney disease: Continue glucose control.    LOS: [place] 12/1/201912:01 PM

## 2018-09-14 NOTE — Progress Notes (Signed)
Progress Note  Patient Name: [name] Date of Encounter: 09/14/2018  Primary Cardiologist: Kathlyn Sacramento, MD    Subjective   57 year old gentleman with a history of diabetes mellitus, end-stage renal disease, severe peripheral vascular disease now status post bilateral AKA's.  To see him several days ago for an episode of paroxysmal atrial fibrillation following his leg amputation. Reverted to sinus rhythm fairly quickly and is now on oral and amiodarone He is on Eliquis 5 mg twice a day.  The plan is for him to go to skilled nursing facility sometime soon.  Inpatient Medications    Scheduled Meds: . amiodarone  400 mg Oral BID  . apixaban  5 mg Oral BID  . aspirin EC  81 mg Oral Daily  . cinacalcet  60 mg Oral Once per day on Mon Wed Fri  . epoetin (EPOGEN/PROCRIT) injection  4,000 Units Intravenous Q M,W,F-HD  . feeding supplement (NEPRO CARB STEADY)  237 mL Oral BID BM  . ferrous sulfate  325 mg Oral TID WC  . gabapentin  100 mg Oral BID  . insulin aspart  0-9 Units Subcutaneous TID WC  . metoprolol tartrate  25 mg Oral BID  . midodrine  5 mg Oral TID WC  . multivitamin  1 tablet Oral QHS  . multivitamin-lutein  1 capsule Oral Daily  . pantoprazole  40 mg Oral BID  . polyethylene glycol  17 g Oral BID  . senna-docusate  1 tablet Oral BID  . sevelamer carbonate  2,400 mg Oral TID WC  . vitamin C  250 mg Oral BID   Continuous Infusions: . sodium chloride 500 mL (09/13/18 2129)  . small volume/piggyback builder 50 mL/hr at 09/13/18 2129   PRN Meds: sodium chloride, acetaminophen, docusate sodium, lidocaine-prilocaine, ondansetron (ZOFRAN) IV, oxyCODONE   Vital Signs    Vitals:   09/13/18 2109 09/14/18 0502 09/14/18 0557 09/14/18 0808  BP: 133/67 117/65  138/87  Pulse: 70 69  69  Resp: 20 16  20   Temp: 98.9 F (37.2 C) 98.8 F (37.1 C)  97.8 F (36.6 C)  TempSrc: Oral Oral  Oral  SpO2: 94% 95%  96%  Weight:   101.9 kg   Height:         Intake/Output Summary (Last 24 hours) at 09/14/2018 1115 Last data filed at 09/14/2018 0553 Gross per 24 hour  Intake 120 ml  Output 0 ml  Net 120 ml   Filed Weights   09/12/18 1344 09/12/18 1458 09/14/18 0557  Weight: 103.7 kg 103 kg 101.9 kg    Telemetry    NSR  - Personally Reviewed  ECG     NSR  - Personally Reviewed  Physical Exam   GEN:  Middle-aged gentleman, no acute distress Neck: No JVD Cardiac:  Rate S1-S2.   Soft systolic murmur Respiratory: Clear to auscultation bilaterally. GI: Soft, nontender, non-distended  MS:  Bilateral AKA's. Neuro:  Nonfocal  Psych: Normal affect   Labs    Chemistry Recent Labs  Lab 09/09/18 0552 09/10/18 1256 09/10/18 1803 09/11/18 0412 09/14/18 0513  NA 139 138  --  137  --   K 4.8 4.2  --  4.6  --   CL 94* 94*  --  91*  --   CO2 32 27  --  29  --   GLUCOSE 186* 233*  --  193*  --   BUN 44* 33*  --  42*  --   CREATININE 7.55* 5.74*  --  6.75* 7.18*  CALCIUM 9.6 9.3  --  9.3  --   PROT  --   --  6.5  --   --   ALBUMIN  --   --  2.8*  --   --   AST  --   --  22  --   --   ALT  --   --  10  --   --   ALKPHOS  --   --  117  --   --   BILITOT  --   --  0.5  --   --   GFRNONAA 7* 10*  --  8* 8*  GFRAA 8* 12*  --  10* 9*  ANIONGAP 13 17*  --  17*  --      Hematology Recent Labs  Lab 09/12/18 0257 09/13/18 0428 09/14/18 0513  WBC 13.2* 15.0* 14.2*  RBC 2.90* 3.02* 3.05*  HGB 8.5* 8.8* 8.8*  HCT 27.8* 29.4* 29.2*  MCV 95.9 97.4 95.7  MCH 29.3 29.1 28.9  MCHC 30.6 29.9* 30.1  RDW 16.3* 16.7* 17.0*  PLT 348 376 410*    Cardiac Enzymes Recent Labs  Lab 09/10/18 1256 09/10/18 1803 09/10/18 2321  TROPONINI 0.05* 0.60* 1.44*   No results for input(s): TROPIPOC in the last 168 hours.   BNPNo results for input(s): BNP, PROBNP in the last 168 hours.   DDimer No results for input(s): DDIMER in the last 168 hours.   Radiology    No results found.  Cardiac Studies     Patient Profile     57 y.o.  male with poorly controlled diabetes complicated by end-stage renal disease and peripheral vascular disease. So has been found to have paroxysmal atrial fibrillation.  Assessment & Plan    1.  Paroxysmal atrial fibrillation: The patient converted to normal sinus rhythm fairly quickly after his surgery. We will reduce his amiodarone dose to 200 mg twice a day.  I would discharge him to the skilled nursing facility on amiodarone 200 mg once a day.  Continue Eliquis 5 mg twice a day.  We will sign off.  He will follow-up with Dr. Fletcher Anon.   [facility] will sign off.   Medication Recommendations:   Amio 200 mg a day upon DC, Eliquis 5 mg PO BID .  Metoprolol 25 BID Other recommendations (labs, testing, etc):   Follow up as an outpatient:   Follow up with Dr. Fletcher Anon in the offic e     For questions or updates, please contact [facility] Please consult [email or web address] for contact info under        Signed, Mertie Moores, MD  09/14/2018, 11:15 AM

## 2018-09-14 NOTE — Plan of Care (Signed)
  Problem: Nutrition: Goal: Adequate nutrition will be maintained Outcome: Progressing   Problem: Pain Managment: Goal: General experience of comfort will improve Outcome: Progressing   

## 2018-09-14 NOTE — Progress Notes (Signed)
[place] [facility] [place] NAME: [name]    MR#:  [id number]  DATE OF BIRTH:  28-Apr-1961  SUBJECTIVE:   Pain surgical site. Constipated  Spanish interpreter used  REVIEW OF SYSTEMS:    Review of Systems  Constitutional: Negative for fever, chills weight loss HENT: Negative for ear pain, nosebleeds, congestion, facial swelling, rhinorrhea, neck pain, neck stiffness and ear discharge.   Respiratory: Negative for cough, shortness of breath, wheezing  Cardiovascular: Negative for chest pain, palpitations and leg swelling.  Gastrointestinal: Negative for heartburn, abdominal pain, vomiting, diarrhea or consitpation Genitourinary: Negative for dysuria, urgency, frequency, hematuria Musculoskeletal: Pain RLE at post op site Neurological: Negative for dizziness, seizures, syncope, focal weakness,  numbness and headaches.  Hematological: Does not bruise/bleed easily.  Psychiatric/Behavioral: Negative for hallucinations, confusion, dysphoric mood  Tolerating Diet: yes  DRUG ALLERGIES:  No Known Allergies  VITALS:  Blood pressure 138/87, pulse 69, temperature 97.8 F (36.6 C), temperature source Oral, resp. rate 20, height 4\' 2"  (1.27 m), weight 101.9 kg, SpO2 96 %.  PHYSICAL EXAMINATION:  Constitutional: Bees. No distress. HENT: Normocephalic. Marland Kitchen Oropharynx is clear and moist.  Eyes: Conjunctivae and EOM are normal. PERRLA, no scleral icterus.  Neck: Normal ROM. Neck supple. No JVD.  CVS: S1, S2,  no murmurs, no gallops, no carotid bruit.  Pulmonary: Effort and breath sounds normal, no stridor, rhonchi, wheezes, rales.  Abdominal: Soft. BS +,  no distension, tenderness, rebound or guarding.  Musculoskeletal: Right AKA dressed. Left BKA stump Neuro: Alert. CN 2-12 grossly intact. No focal deficits. Skin: Skin is warm and dry. No rash noted. Psychiatric: Normal mood and affect.   LABORATORY PANEL:   CBC Recent Labs  Lab 09/14/18 0513   WBC 14.2*  HGB 8.8*  HCT 29.2*  PLT 410*   ------------------------------------------------------------------------------------------------------------------  Chemistries  Recent Labs  Lab 09/10/18 0552  09/10/18 1803 09/11/18 0412 09/14/18 0513  NA  --    < >  --  137  --   K  --    < >  --  4.6  --   CL  --    < >  --  91*  --   CO2  --    < >  --  29  --   GLUCOSE  --    < >  --  193*  --   BUN  --    < >  --  42*  --   CREATININE  --    < >  --  6.75* 7.18*  CALCIUM  --    < >  --  9.3  --   MG 2.6*  --   --   --   --   AST  --   --  22  --   --   ALT  --   --  10  --   --   ALKPHOS  --   --  117  --   --   BILITOT  --   --  0.5  --   --    < > = values in this interval not displayed.   ------------------------------------------------------------------------------------------------------------------  Cardiac Enzymes Recent Labs  Lab 09/10/18 1256 09/10/18 1803 09/10/18 2321  TROPONINI 0.05* 0.60* 1.44*   ------------------------------------------------------------------------------------------------------------------  RADIOLOGY:  No results found.   ASSESSMENT AND PLAN:   57 year old male with end-stage renal disease on dialysis presented with early ischemic changes in the right lower extremity.  *  AFIB with RVR On amiodarone and heparin Appreciate cardiology consult In NSR today Changed to PO amiodarone. Stop heparin . 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 Wound looks clean per vascular eval. Stopped abx.  * End-stage renal disease on hemodialysis: Management as per nephrology  * Chronic systolic and diastolic heart failure  No signs of fluid overload  * Elevated troponin with CAD Likely demand ischemia from CTO of RCA. status post cardiac catheterization in 2017 showing  Ost LM to LM lesion, 10% stenosed.  Ost RCA to  Prox RCA lesion, 100% stenosed.  Continue aspirin Due to low blood pressure he is not on hypertensive medications.   Management plans discussed with the patient and he is in agreement.  CODE STATUS: Full code   TOTAL TIME TAKING CARE OF THIS PATIENT: 35 minutes.   D/C to SNF in AM  Leia Alf Mubashir Mallek M.D on 09/14/2018 at 12:31 PM  Between 7am to 6pm - Pager - [phone]  After 6pm go to [email or web address] - password [email or web address] [facility]  Office  [phone]  CC: Primary care physician; [name], MD  Note: This dictation was prepared with Dragon dictation along with smaller phrase technology. Any transcriptional errors that result from this process are unintentional.

## 2018-09-14 NOTE — Progress Notes (Signed)
Used the interpreter on a stick to inform the patient about his medication and to perform my assessment Interpreters name is Melina [phone]

## 2018-09-14 NOTE — Evaluation (Signed)
Physical Therapy Evaluation Patient Details Name: [name] MRN: [id number] DOB: 1961-03-19 Today's Date: 09/14/2018   History of Present Illness  Patient is a 57 year old male admited with ischemia, gangrene to Right LE. Patient has PMH to include: DM, PVD, L BKA. Patient has prothesis, but does not have here in hospital at time of eval. Patient is s/p right AKA.     Clinical Impression  Patient require mod +1-2 assistance with bed mobility to include supine to sit and scooting in bed due to B UE weakness and B LE amputations. Patient will require left LE prosthesis to determine transfer/gait ability.  Patient will benefit from short term rehab upon discharge to improve UE strength and safety with transfers and overall mobility before returning home with wife.     Follow Up Recommendations SNF    Equipment Recommendations       Recommendations for Other Services       Precautions / Restrictions Precautions Precautions: Fall Restrictions Weight Bearing Restrictions: No      Mobility  Bed Mobility Overal bed mobility: Needs Assistance Bed Mobility: Supine to Sit;Sit to Supine     Supine to sit: Mod assist Sit to supine: Min assist   General bed mobility comments: difficulty raising trunk from supine to sit.   Transfers                    Ambulation/Gait             General Gait Details: unable to ambulate at this time  Stairs            Wheelchair Mobility    Modified Rankin (Stroke Patients Only)       Balance Overall balance assessment: Needs assistance Sitting-balance support: Bilateral upper extremity supported Sitting balance-Leahy Scale: Fair Sitting balance - Comments: once assisted to upright seated position patient able to maintain sitting balance with B UE support.                                      Pertinent Vitals/Pain Pain Assessment: No/denies pain    Home Living Family/patient expects to be  discharged to:: Private residence Living Arrangements: Spouse/significant other Available Help at Discharge: Family Type of Home: Mobile home Home Access: Ramped entrance     Home Layout: One level Home Equipment: Environmental consultant - 4 wheels;Wheelchair - manual      Prior Function Level of Independence: Independent with assistive device(s)         Comments: Patient has been ambulating with rolling walker and prosthesis prior to this. Using wheelchair most currently due to problems with right LE.       Hand Dominance        Extremity/Trunk Assessment   Upper Extremity Assessment Upper Extremity Assessment: Generalized weakness    Lower Extremity Assessment Lower Extremity Assessment: Generalized weakness       Communication   Communication: Interpreter utilized;Prefers language other than English  Cognition Arousal/Alertness: Awake/alert Behavior During Therapy: WFL for tasks assessed/performed Overall Cognitive Status: Within Functional Limits for tasks assessed                                        General Comments      Exercises     Assessment/Plan    PT Assessment Patient needs  continued PT services  PT Problem List Decreased balance;Decreased mobility;Decreased strength       PT Treatment Interventions Therapeutic exercise;Patient/family education;Therapeutic activities;Functional mobility training    PT Goals (Current goals can be found in the Care Plan section)  Acute Rehab PT Goals Patient Stated Goal: to eventually return home PT Goal Formulation: With patient/family Time For Goal Achievement: 09/28/18 Potential to Achieve Goals: Fair    Frequency 7X/week   Barriers to discharge        Co-evaluation               AM-PAC PT "6 Clicks" Mobility  Outcome Measure Help needed turning from your back to your side while in a flat bed without using bedrails?: Total Help needed moving from lying on your back to sitting on the side  of a flat bed without using bedrails?: Total Help needed moving to and from a bed to a chair (including a wheelchair)?: Total Help needed standing up from a chair using your arms (e.g., wheelchair or bedside chair)?: Total Help needed to walk in hospital room?: Total Help needed climbing 3-5 steps with a railing? : Total 6 Click Score: 6    End of Session   Activity Tolerance: Patient tolerated treatment well;Patient limited by fatigue Patient left: in bed;with bed alarm set;with family/visitor present;with call bell/phone within reach Nurse Communication: Mobility status PT Visit Diagnosis: Muscle weakness (generalized) (M62.81);Other abnormalities of gait and mobility (R26.89)    Time: 4098-1191 PT Time Calculation (min) (ACUTE ONLY): 20 min   Charges:   PT Evaluation $PT Eval Moderate Complexity: 1 Mod PT Treatments $Therapeutic Activity: 8-22 mins        Mustaf Antonacci, PT, GCS 09/14/18,11:18 AM

## 2018-09-15 LAB — GLUCOSE, CAPILLARY
GLUCOSE-CAPILLARY: 200 mg/dL — AB (ref 70–99)
Glucose-Capillary: 165 mg/dL — ABNORMAL HIGH (ref 70–99)
Glucose-Capillary: 180 mg/dL — ABNORMAL HIGH (ref 70–99)
Glucose-Capillary: 197 mg/dL — ABNORMAL HIGH (ref 70–99)
Glucose-Capillary: 188 mg/dL — ABNORMAL HIGH (ref 70–99)
Glucose-Capillary: 225 mg/dL — ABNORMAL HIGH (ref 70–99)
Glucose-Capillary: 181 mg/dL — ABNORMAL HIGH (ref 70–99)
Glucose-Capillary: 133 mg/dL — ABNORMAL HIGH (ref 70–99)
Glucose-Capillary: 135 mg/dL — ABNORMAL HIGH (ref 70–99)

## 2018-09-15 MED ORDER — GABAPENTIN 100 MG PO CAPS: 100.0000 mg | ORAL_CAPSULE | Freq: Two times a day (BID) | ORAL | Status: DC

## 2018-09-15 MED ORDER — AMIODARONE HCL 200 MG PO TABS: 200.0000 mg | ORAL_TABLET | Freq: Every day | ORAL | Status: DC

## 2018-09-15 MED ORDER — APIXABAN 5 MG PO TABS: 5.0000 mg | ORAL_TABLET | Freq: Two times a day (BID) | ORAL | 0 refills | Status: DC

## 2018-09-15 MED ORDER — OXYCODONE HCL 5 MG PO TABS: 5.0000 mg | ORAL_TABLET | Freq: Four times a day (QID) | ORAL | 0 refills | Status: DC | PRN

## 2018-09-15 MED ORDER — METOPROLOL TARTRATE 25 MG PO TABS: 25.0000 mg | ORAL_TABLET | Freq: Two times a day (BID) | ORAL | 0 refills | Status: DC

## 2018-09-15 MED ORDER — MIDODRINE HCL 5 MG PO TABS: 5.0000 mg | ORAL_TABLET | Freq: Three times a day (TID) | ORAL | Status: DC

## 2018-09-15 NOTE — Progress Notes (Signed)
HD tx end    09/15/18 1317  Vital Signs  Pulse Rate 67  Pulse Rate Source Monitor  Resp 19  BP 131/62  BP Location Right Arm  BP Method Automatic  Patient Position (if appropriate) Lying  Oxygen Therapy  SpO2 94 %  O2 Device Room Air  During Hemodialysis Assessment  Dialysis Fluid Bolus Normal Saline  Bolus Amount (mL) 250 mL  Intra-Hemodialysis Comments Tx completed

## 2018-09-15 NOTE — Care Management Important Message (Signed)
Copy of Medicare IM left in patient's room (out for dialysis).

## 2018-09-15 NOTE — Clinical Social Work Placement (Signed)
   CLINICAL SOCIAL WORK PLACEMENT  NOTE  Date:  09/15/2018  Patient Details  Name: [name] MRN: [id number] Date of Birth: September 30, 1961  Clinical Social Work is seeking post-discharge placement for this patient at the [place] level of care (*CSW will initial, date and re-position this form in  chart as items are completed):  Yes   Patient/family provided with [place] Work Department's list of facilities offering this level of care within the geographic area requested by the patient (or if unable, by the patient's family).  Yes   Patient/family informed of their freedom to choose among providers that offer the needed level of care, that participate in Medicare, Medicaid or managed care program needed by the patient, have an available bed and are willing to accept the patient.  Yes   Patient/family informed of 's ownership interest in [facility] and [facility], as well as of the fact that they are under no obligation to receive care at these facilities.  PASRR submitted to EDS on 09/12/18     PASRR number received on       Existing PASRR number confirmed on 09/12/18     FL2 transmitted to all facilities in geographic area requested by pt/family on 09/13/18     FL2 transmitted to all facilities within larger geographic area on       Patient informed that his/her managed care company has contracts with or will negotiate with certain facilities, including the following:        Yes   Patient/family informed of bed offers received.  Patient chooses bed at [facility]     Physician recommends and patient chooses bed at      Patient to be transferred to [facility] on 09/15/18.  Patient to be transferred to facility by [facility]     Patient family notified on 09/15/18 of transfer.  Name of family member notified:  Patient's wife Filbert Schilder     PHYSICIAN Please sign FL2, Please prepare  priority discharge summary, including medications, Please prepare prescriptions     Additional Comment:    _______________________________________________ Ross Ludwig, LCSWA 09/15/2018, 5:01 PM

## 2018-09-15 NOTE — Progress Notes (Signed)
Pre HD assessment    09/15/18 0933  Neurological  Level of Consciousness Alert  Orientation Level Oriented X4  Respiratory  Respiratory Pattern Regular;Unlabored  Chest Assessment Chest expansion symmetrical  Cardiac  ECG Monitor Yes  Cardiac Rhythm NSR  Vascular  R Radial Pulse +2  L Radial Pulse +2  Edema Generalized  Integumentary  Integumentary (WDL) X  Skin Color Appropriate for ethnicity  Musculoskeletal  Musculoskeletal (WDL) X  Generalized Weakness Yes  Assistive Device None  GU Assessment  Genitourinary (WDL) X  Genitourinary Symptoms  (HD)  Psychosocial  Psychosocial (WDL) WDL

## 2018-09-15 NOTE — Clinical Social Work Note (Signed)
Clinical Social Work Assessment  Patient Details  Name: XANDER JUTRAS MRN: [id number] Date of Birth: [birth year]  Date of referral:  09/15/18               Reason for consult:  Facility Placement                Permission sought to share information with:  Family Supports, Customer service manager Permission granted to share information::  Yes, Verbal Permission Granted  Name::     Elby Beck Spouse [phone] or Cordelia Poche [phone] or Reyes,Norma Sister   [phone]   Agency::  SNF admissions  Relationship::     Contact Information:     Housing/Transportation Living arrangements for the past 2 months:  Single Family Home Source of Information:  Patient, Spouse Patient Interpreter Needed:  None Criminal Activity/Legal Involvement Pertinent to Current Situation/Hospitalization:  No - Comment as needed Significant Relationships:  Other Family Members, Spouse Lives with:  Spouse Do you feel safe going back to the place where you live?  No Need for family participation in patient care:  No (Coment)  Care giving concerns:  Patient and his wife feel that he needs some short term rehab before he is able to return back home.   Social Worker assessment / plan:  Patient is a 57 year old male who is married and lives with his wife.  Patient was explained role of CSW and process for looking for placement for SNF.  Patient has been to [facility] and would like to return in possible.  CSW explained role and process of trying to find placement at [facility] for patient.  CSW reminded patient how insurance will pay for stay and what to expect at SNF.  CSW   Patient and his wife expressed understanding, and gave CSW permission to begin bed search in [place].  Patient and his wife did not have any other questions.  Employment status:  Disabled (Comment on whether or not currently receiving Disability) Insurance information:  Medicare, Managed Care, Medicaid In  [place] PT Recommendations:  [place] / Referral to community resources:  [place]  Patient/Family's Response to care:  Patient and family are agreeable to going to SNF for short term rehab.  Patient/Family's Understanding of and Emotional Response to Diagnosis, Current Treatment, and Prognosis: Patient and family are hopeful that he will not have to be in SNF for very long.  Emotional Assessment Appearance:  Appears stated age Attitude/Demeanor/Rapport:    Affect (typically observed):  Appropriate, Calm, Pleasant, Stable Orientation:  Oriented to Self, Oriented to Place, Oriented to  Time, Oriented to Situation Alcohol / Substance use:  Not Applicable Psych involvement (Current and /or in the community):  No (Comment)  Discharge Needs  Concerns to be addressed:  Lack of Support, Care Coordination Readmission within the last 30 days:  No Current discharge risk:  Lack of support system Barriers to Discharge:  Continued Medical Work up   Anell Barr 09/15/2018, 4:37 PM

## 2018-09-15 NOTE — Discharge Summary (Addendum)
[place] [facility] [place] NAME: Saurav Crumble    MR#:  [id number]  DATE OF BIRTH:  1961/05/25  DATE OF ADMISSION:  09/03/2018 ADMITTING PHYSICIAN: Vaughan Basta, MD  DATE OF DISCHARGE: 09/15/2018  PRIMARY CARE PHYSICIAN: [name], MD   ADMISSION DIAGNOSIS:  Ischemic leg [I99.8]  DISCHARGE DIAGNOSIS:  Active Problems:   Atherosclerosis of extremity with gangrene ([place])   Limb ischemia   End stage renal disease on dialysis [facility])   NSVT (nonsustained ventricular tachycardia) (HCC)   Atrial fibrillation with rapid ventricular response ([place])   Demand ischemia ([place])   SECONDARY DIAGNOSIS:   Past Medical History:  Diagnosis Date  . Anemia of chronic disease   . Atherosclerosis   . Atrial fibrillation with rapid ventricular response (HCC)    chadsvasc at least 6; new onset versus paroxysmal with first or most recent episode on 09/10/18  . CAD (coronary artery disease)    2017 LHC, severe 1v disease, occluded pRCA, L-R collaterals, moderate LV dysfunction EF 30-35  . Diabetes mellitus with complication ([place])   . End stage renal disease on dialysis ([place])    Labile BP  . History of ventricular tachycardia    2017 polymorphic VT and cardiac arrest s/p angioplasty   . Hypercholesteremia   . Hyperlipidemia LDL goal <70   . Hypertension   . Labile blood pressure    on HD  . NSVT (nonsustained ventricular tachycardia) ([place])    08/2018 NSVT  . Peripheral arterial occlusive disease (HCC)    s/p L BKA, R AKA  . S/P AKA (above knee amputation) unilateral, right ([place])    08/2018  . S/P BKA (below knee amputation) unilateral, left ([place])    2015  . Stroke ([place])   . Systolic heart failure ([place])    2017 EF per LHC 30-35%     ADMITTING HISTORY  HISTORY OF PRESENT ILLNESS: Maxfield Gildersleeve  is a 57 y.o. male with a known history of anemia, atherosclerosis, stage renal disease now on his mellitus with complications of atherosclerosis  of peripheral arteries and status post amputation, hypertension, hypercholesterolemia, stroke-presented to hospital 3 weeks ago with early ischemic changes in right lower extremity where angiogram was done by vascular and thrombectomy on several vessels was done.  They were also discussing about amputation at that time but no clear decision was made. Patient continue to take aspirin and Plavix at home as advised.  Since last 2 to 3 days his right lower extremity is getting cold and having pain so brought to emergency room today again. ER physician spoke to vascular surgery and they suggested to start on heparin drip and admit to hospitalist service for possible amputation planning tomorrow.  HOSPITAL COURSE:   57 year old male with end-stage renal disease on dialysis presented with early ischemic changes in the right lower extremity.  * AFIB with RVR Appreciate cardiology consult In NSR now PO amiodarone . 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 Wound looks clean per vascular eval. Stopped abx.  * End-stage renal disease on hemodialysis: Management as per nephrology  * Chronic systolic and diastolic heart failure  No signs of fluid overload  * Elevated troponin with CAD Likely demand ischemia from CTO of RCA. status post cardiac catheterization in 2017 showing  Ost LM to LM lesion,  10% stenosed.  Ost RCA to Prox RCA lesion, 100% stenosed.  Was On Aspirin and plavix.  Stopped plavix as patient is also on Eliquis now as per cardiology recommendations  staples to be removed in 3 weeks  CONSULTS OBTAINED:  Treatment Team:  Schnier, Dolores Lory, MD Lavonia Dana, MD  DRUG ALLERGIES:  No Known Allergies  DISCHARGE MEDICATIONS:   Allergies as of 09/15/2018   No Known Allergies     Medication List    STOP taking these  medications   clopidogrel 75 MG tablet Commonly known as:  PLAVIX   traMADol 50 MG tablet Commonly known as:  ULTRAM     TAKE these medications   amiodarone 200 MG tablet Commonly known as:  PACERONE Take 1 tablet (200 mg total) by mouth daily.   apixaban 5 MG Tabs tablet Commonly known as:  ELIQUIS Take 1 tablet (5 mg total) by mouth 2 (two) times daily.   aspirin EC 81 MG tablet Take 81 mg by mouth daily.   b complex-vitamin c-folic acid 0.8 MG Tabs tablet Take 1 tablet by mouth daily.   cinacalcet 60 MG tablet Commonly known as:  SENSIPAR Take 60 mg by mouth daily.   ferrous sulfate 325 (65 FE) MG EC tablet Take 325 mg by mouth 3 (three) times daily with meals.   gabapentin 100 MG capsule Commonly known as:  NEURONTIN Take 1 capsule (100 mg total) by mouth 2 (two) times daily.   glipiZIDE 10 MG tablet Commonly known as:  GLUCOTROL Take 10 mg by mouth daily.   lidocaine-prilocaine cream Commonly known as:  EMLA Apply 1 application topically as needed (port access).   metoprolol tartrate 25 MG tablet Commonly known as:  LOPRESSOR Take 1 tablet (25 mg total) by mouth 2 (two) times daily.   midodrine 5 MG tablet Commonly known as:  PROAMATINE Take 1 tablet (5 mg total) by mouth 3 (three) times daily with meals.   omeprazole 40 MG capsule Commonly known as:  PRILOSEC Take 40 mg by mouth 2 (two) times daily.   oxyCODONE 5 MG immediate release tablet Commonly known as:  Oxy IR/ROXICODONE Take 1 tablet (5 mg total) by mouth every 6 (six) hours as needed for moderate pain or severe pain.   polyethylene glycol packet Commonly known as:  MIRALAX / GLYCOLAX Take 17 g by mouth 2 (two) times daily.   sevelamer carbonate 800 MG tablet Commonly known as:  RENVELA Take 2,400 mg by mouth 3 (three) times daily with meals.       Today   VITAL SIGNS:  Blood pressure 111/64, pulse 68, temperature 98.3 F (36.8 C), temperature source Oral, resp. rate 16, height 4'  2" (1.27 m), weight 106.4 kg, SpO2 91 %.  I/O:    Intake/Output Summary (Last 24 hours) at 09/15/2018 1113 Last data filed at 09/15/2018 0700 Gross per 24 hour  Intake 50 ml  Output 0 ml  Net 50 ml    PHYSICAL EXAMINATION:  Physical Exam  GENERAL:  57 y.o.-year-old patient lying in the bed with no acute distress.  LUNGS: Normal breath sounds bilaterally, no wheezing, rales,rhonchi or crepitation. No use of accessory muscles of respiration.  CARDIOVASCULAR: S1, S2 normal. No murmurs, rubs, or gallops.  ABDOMEN: Soft, non-tender, non-distended. Bowel sounds present. No organomegaly or mass.  NEUROLOGIC: Moves all 4 extremities. PSYCHIATRIC: The patient is alert and oriented x 3.  SKIN: No obvious rash, lesion, or ulcer.  Right AKA dressing Left BKA  DATA  REVIEW:   CBC Recent Labs  Lab 09/14/18 0513  WBC 14.2*  HGB 8.8*  HCT 29.2*  PLT 410*    Chemistries  Recent Labs  Lab 09/10/18 0552  09/10/18 1803 09/11/18 0412 09/14/18 0513  NA  --    < >  --  137  --   K  --    < >  --  4.6  --   CL  --    < >  --  91*  --   CO2  --    < >  --  29  --   GLUCOSE  --    < >  --  193*  --   BUN  --    < >  --  42*  --   CREATININE  --    < >  --  6.75* 7.18*  CALCIUM  --    < >  --  9.3  --   MG 2.6*  --   --   --   --   AST  --   --  22  --   --   ALT  --   --  10  --   --   ALKPHOS  --   --  117  --   --   BILITOT  --   --  0.5  --   --    < > = values in this interval not displayed.    Cardiac Enzymes Recent Labs  Lab 09/10/18 2321  TROPONINI 1.44*    Microbiology Results  Results for orders placed or performed during the hospital encounter of 08/12/18  MRSA PCR Screening     Status: None   Collection Time: 08/13/18  9:08 AM  Result Value Ref Range Status   MRSA by PCR NEGATIVE NEGATIVE Final    Comment:        The GeneXpert MRSA Assay (FDA approved for NASAL specimens only), is one component of a comprehensive MRSA colonization surveillance program. It  is not intended to diagnose MRSA infection nor to guide or monitor treatment for MRSA infections. Performed at [facility], [address] [place], [place] [address]     RADIOLOGY:  No results found.  Follow up with PCP in 1 week.  Management plans discussed with the patient, family and they are in agreement.  CODE STATUS:     Code Status Orders  (From admission, onward)         Start     Ordered   09/03/18 2042  Full code  Continuous     09/03/18 2041        Code Status History    Date Active Date Inactive Code Status Order ID Comments User Context   08/12/2018 1629 08/14/2018 1324 Full Code [id number]  [name], MD ED   07/24/2018 1246 07/24/2018 1759 Full Code [id number]  [name], MD Inpatient   01/09/2017 1135 01/09/2017 1531 Full Code [id number]  [name], MD Inpatient   04/28/2016 1034 05/02/2016 2139 Full Code [id number]  Flora Lipps, MD Inpatient   04/25/2016 1340 04/28/2016 1034 Full Code [id number]  Schnier, Dolores Lory, MD Inpatient      TOTAL TIME TAKING CARE OF THIS PATIENT ON DAY OF DISCHARGE: more than 30 minutes.   Neita Carp M.D on 09/15/2018 at 11:13 AM  Between 7am to 6pm - Pager - [phone]  After 6pm go to [email or web address] - [place]  Hospitalists  Office  [phone]  CC: Primary care physician; [name], MD  Note: This dictation was prepared with Dragon dictation along with smaller phrase technology. Any transcriptional errors that result from this process are unintentional.

## 2018-09-15 NOTE — Progress Notes (Signed)
PT Cancellation Note  Patient Details Name: [name] MRN: [id number] DOB: 1961-03-17   Cancelled Treatment:    Reason Eval/Treat Not Completed: Patient at procedure or test/unavailable. Pt in hemodialysis all morning; now with current discharge. Will follow chart to ensure pt discharging today.    Larae Grooms, PTA 09/15/2018, 11:52 AM

## 2018-09-15 NOTE — Progress Notes (Signed)
Patient left with EMS. Taken out in Biomedical scientist.  Family at side. Taken to facility.

## 2018-09-15 NOTE — Progress Notes (Signed)
Pre HD assessment    09/15/18 0932  Vital Signs  Temp 98.3 F (36.8 C)  Temp Source Oral  Pulse Rate 69  Pulse Rate Source Monitor  Resp 13  BP 139/77  BP Location Right Arm  BP Method Automatic  Patient Position (if appropriate) Lying  Oxygen Therapy  SpO2 95 %  O2 Device Room Air  Pain Assessment  Pain Scale 0-10  Pain Score 0  Dialysis Weight  Weight 106.4 kg  Type of Weight Pre-Dialysis  Time-Out for Hemodialysis  What Procedure? HD  Pt Identifiers(min of two) First/Last Name;MRN/Account#  Correct Site? Yes  Correct Side? Yes  Correct Procedure? Yes  Consents Verified? Yes  Rad Studies Available? N/A  Safety Precautions Reviewed? Yes  Engineer, civil (consulting) Number  ([id number])  Station Number 4  UF/Alarm Test Passed  Conductivity: Meter 13.6  Conductivity: Machine  13.5  pH 7.4  Reverse Osmosis main  Normal Saline Lot Number [id number]  Dialyzer Lot Number [id number]  Disposable Set Lot Number [id number]  Machine Temperature 98.6 F (37 C)  Musician and Audible Yes  Blood Lines Intact and Secured Yes  Pre Treatment Patient Checks  Vascular access used during treatment Fistula  Hepatitis B Surface Antigen Results Negative  Date Hepatitis B Surface Antigen Drawn 08/13/18  Hepatitis B Surface Antibody  (>10)  Date Hepatitis B Surface Antibody Drawn 08/13/18  Hemodialysis Consent Verified Yes  Hemodialysis Standing Orders Initiated Yes  ECG (Telemetry) Monitor On Yes  Prime Ordered Normal Saline  Length of  DialysisTreatment -hour(s) 3.5 Hour(s)  Dialyzer Elisio 17H NR  Dialysate 2K, 2.5 Ca  Dialysis Anticoagulant None  Dialysate Flow Ordered 800  Blood Flow Rate Ordered 400 mL/min  Ultrafiltration Goal 1 Liters  Dialysis Blood Pressure Support Ordered Normal Saline  Education / Care Plan  Dialysis Education Provided Yes  Documented Education in Care Plan Yes  Fistula / Graft Left Forearm Arteriovenous fistula  No Placement Date or Time found.   Placed  prior to admission: Yes  Orientation: Left  Access Location: Forearm  Access Type: Arteriovenous fistula  Site Condition No complications  Fistula / Graft Assessment Present;Thrill;Bruit  Drainage Description None

## 2018-09-15 NOTE — Progress Notes (Signed)
Post HD assessment. Pt tolerate tx well without c/o or complication. Net UF 1002, goal met.    09/15/18 1322  Vital Signs  Temp 98.2 F (36.8 C)  Temp Source Oral  Pulse Rate 70  Pulse Rate Source Monitor  Resp 18  BP 133/64  BP Location Right Arm  BP Method Automatic  Patient Position (if appropriate) Lying  Oxygen Therapy  SpO2 93 %  O2 Device Room Air  Dialysis Weight  Weight 104.6 kg  Type of Weight Post-Dialysis  Post-Hemodialysis Assessment  Rinseback Volume (mL) 250 mL  KECN 76.4 V  Dialyzer Clearance Lightly streaked  Duration of HD Treatment -hour(s) 3.5 hour(s)  Hemodialysis Intake (mL) 500 mL  UF Total -Machine (mL) 1502 mL  Net UF (mL) 1002 mL  Tolerated HD Treatment Yes  AVG/AVF Arterial Site Held (minutes) 10 minutes  AVG/AVF Venous Site Held (minutes) 10 minutes  Education / Care Plan  Dialysis Education Provided Yes  Documented Education in Care Plan Yes  Fistula / Graft Left Forearm Arteriovenous fistula  No Placement Date or Time found.   Placed prior to admission: Yes  Orientation: Left  Access Location: Forearm  Access Type: Arteriovenous fistula  Site Condition No complications  Fistula / Graft Assessment Present;Thrill;Bruit  Status Deaccessed  Drainage Description None

## 2018-09-15 NOTE — Progress Notes (Signed)
[facility]  ROUNDING NOTE   Subjective:  Patient seen and evaluated during hemodialysis. Tolerating well.  Objective:  Vital signs in last 24 hours:  Temp:  [98.2 F (36.8 C)-99 F (37.2 C)] 98.3 F (36.8 C) (12/02 0932) Pulse Rate:  [65-69] 68 (12/02 1215) Resp:  [13-20] 16 (12/02 1215) BP: (106-146)/(49-78) 124/64 (12/02 1215) SpO2:  [90 %-96 %] 95 % (12/02 1215) Weight:  [106.4 kg] 106.4 kg (12/02 0932)  Weight change:  Filed Weights   09/12/18 1458 09/14/18 0557 09/15/18 0932  Weight: 103 kg 101.9 kg 106.4 kg    Intake/Output: I/O last 3 completed shifts: In: 170 [P.O.:120; IV Piggyback:50] Out: 0    Intake/Output this shift:  No intake/output data recorded.  Physical Exam: General: NAD  Head: Normocephalic, atraumatic. Moist oral mucosal membranes  Eyes: Anicteric  Neck: Supple  Lungs:  Clear to auscultation  Heart: Regular no rubs  Abdomen:  Soft, nontender, BS present  Extremities:  left BKA, right AKA  Neurologic: Nonfocal, able to answer questions  Skin: No lesions  Access: left arm AVF    Basic Metabolic Panel: Recent Labs  Lab 09/08/18 1405 09/09/18 0552 09/10/18 0552 09/10/18 1256 09/11/18 0412 09/14/18 0513  NA  --  139  --  138 137  --   K 4.9 4.8  --  4.2 4.6  --   CL  --  94*  --  94* 91*  --   CO2  --  32  --  27 29  --   GLUCOSE  --  186*  --  233* 193*  --   BUN  --  44*  --  33* 42*  --   CREATININE  --  7.55*  --  5.74* 6.75* 7.18*  CALCIUM  --  9.6  --  9.3 9.3  --   MG  --  2.3 2.6*  --   --   --   PHOS  --   --  8.0*  --   --   --     Liver Function Tests: Recent Labs  Lab 09/10/18 1803  AST 22  ALT 10  ALKPHOS 117  BILITOT 0.5  PROT 6.5  ALBUMIN 2.8*   No results for input(s): LIPASE, AMYLASE in the last 168 hours. No results for input(s): AMMONIA in the last 168 hours.  CBC: Recent Labs  Lab 09/10/18 1256 09/11/18 0412 09/12/18 0257 09/13/18 0428 09/14/18 0513  WBC 13.3* 13.5* 13.2*  15.0* 14.2*  NEUTROABS 10.7*  --   --   --   --   HGB 9.7* 8.4* 8.5* 8.8* 8.8*  HCT 32.3* 28.2* 27.8* 29.4* 29.2*  MCV 97.3 97.6 95.9 97.4 95.7  PLT 295 300 348 376 410*    Cardiac Enzymes: Recent Labs  Lab 09/10/18 1256 09/10/18 1803 09/10/18 2321  TROPONINI 0.05* 0.60* 1.44*    BNP: Invalid input(s): POCBNP  CBG: Recent Labs  Lab 09/14/18 0813 09/14/18 1149 09/14/18 1636 09/14/18 2056 09/15/18 0822  GLUCAP 135* 181* 178* 183* 133*    Microbiology: Results for orders placed or performed during the hospital encounter of 08/12/18  MRSA PCR Screening     Status: None   Collection Time: 08/13/18  9:08 AM  Result Value Ref Range Status   MRSA by PCR NEGATIVE NEGATIVE Final    Comment:        The GeneXpert MRSA Assay (FDA approved for NASAL specimens only), is one component of a comprehensive MRSA colonization surveillance program.  It is not intended to diagnose MRSA infection nor to guide or monitor treatment for MRSA infections. Performed at [facility], [address] [place], [place] [address]     Coagulation Studies: No results for input(s): LABPROT, INR in the last 72 hours.  Urinalysis: No results for input(s): COLORURINE, LABSPEC, PHURINE, GLUCOSEU, HGBUR, BILIRUBINUR, KETONESUR, PROTEINUR, UROBILINOGEN, NITRITE, LEUKOCYTESUR in the last 72 hours.  Invalid input(s): APPERANCEUR    Imaging: No results found.   Medications:   . sodium chloride 500 mL (09/13/18 2129)  . small volume/piggyback builder 50 mL/hr at 09/13/18 2129   . amiodarone  200 mg Oral BID  . apixaban  5 mg Oral BID  . aspirin EC  81 mg Oral Daily  . cinacalcet  60 mg Oral Once per day on Mon Wed Fri  . epoetin (EPOGEN/PROCRIT) injection  4,000 Units Intravenous Q M,W,F-HD  . feeding supplement (NEPRO CARB STEADY)  237 mL Oral BID BM  . ferrous sulfate  325 mg Oral TID WC  . gabapentin  100 mg Oral BID  . insulin aspart  0-9 Units Subcutaneous TID WC  .  metoprolol tartrate  25 mg Oral BID  . midodrine  5 mg Oral TID WC  . multivitamin  1 tablet Oral QHS  . multivitamin-lutein  1 capsule Oral Daily  . pantoprazole  40 mg Oral BID  . polyethylene glycol  17 g Oral BID  . senna-docusate  1 tablet Oral BID  . sevelamer carbonate  800 mg Oral TID WC  . vitamin C  250 mg Oral BID   sodium chloride, acetaminophen, docusate sodium, lidocaine-prilocaine, ondansetron (ZOFRAN) IV, oxyCODONE  Assessment/ Plan:  [name] is a 57 y.o. Hispanic male with end stage renal disease, peripheral vascular disease, hypertension, CVA, hyperlipidemia, diabetes mellitus type II who is admitted to [facility]   MWF [place] ? kg Left AVF  1. End Stage Renal Disease:  MWF schedule.   Patient seen and evaluated during hemodialysis tolerating well.  Continue dialysis, and schedule.     2. Anemia of chronic kidney disease: hemoglobin 8.8 - hemoglobin 8.8.  Continue Epogen 4000 units IV with dialysis.  3. Hypotension - Midodrine before dialysis treatments  4. Secondary Hyperparathyroidism:  - continue Renvela and Sensipar with meals.  5. Peripheral arterial disease: Underwent right above-the-knee amputation November 26 by Dr. Lucky Cowboy  6. Diabetes mellitus type II with chronic kidney disease: Continue glucose control per hospitalist.    LOS: 12 [name] 12/2/201912:18 PM

## 2018-09-15 NOTE — Discharge Instructions (Signed)
Vascular Surgery Wound Care Instructions Please change amputation dressing daily or sooner if needed. No Xeroform to staple line.  Cover with Kerlix first then cover with Coban.  OK  to leave open to air between dressing changes or when patient is not working with PT / active.

## 2018-09-15 NOTE — Clinical Social Work Note (Signed)
CSW presented bed offers through medical interpreter since his primary language is Spanish, patient and his wife chose [facility].  CSW spoke to [facility] and they can accept patient today.    Patient to be d/c'ed today to [facility] room 705.  Patient and family agreeable to plans will transport via ems RN to call report [phone].  Patient's wife was at bedside and is aware that patient is discharging today.  Evette Cristal, MSW, [place]

## 2018-09-15 NOTE — Progress Notes (Signed)
Report given to Elmyra Ricks, LPN at [facility]. [facility] has been called for transportation.

## 2018-09-15 NOTE — Progress Notes (Signed)
Post HD assessment    09/15/18 1321  Neurological  Level of Consciousness Alert  Orientation Level Oriented X4  Respiratory  Respiratory Pattern Regular;Unlabored  Chest Assessment Chest expansion symmetrical  Cardiac  ECG Monitor Yes  Cardiac Rhythm NSR  Vascular  R Radial Pulse +2  L Radial Pulse +2  Edema Generalized  Integumentary  Integumentary (WDL) X  Skin Color Appropriate for ethnicity  Musculoskeletal  Musculoskeletal (WDL) X  Generalized Weakness Yes  Assistive Device None  GU Assessment  Genitourinary (WDL) X  Genitourinary Symptoms  (HD)  Psychosocial  Psychosocial (WDL) WDL

## 2018-09-15 NOTE — Consult Note (Signed)
WOC consulted for new AKA dressing.  Noted in vascular surgery notes from 09/13/18 staples to be removed in 3-4 weeks. Vascular surgery following this patient would request nursing staff to contact vascular for any post operative dressing change orders.   [place], [place], [place]

## 2018-09-15 NOTE — Progress Notes (Signed)
HD tx start    09/15/18 0942  Vital Signs  Pulse Rate 69  Pulse Rate Source Monitor  Resp 20  BP (!) 146/65  BP Location Right Arm  BP Method Automatic  Patient Position (if appropriate) Lying  Oxygen Therapy  SpO2 94 %  O2 Device Room Air  During Hemodialysis Assessment  Blood Flow Rate (mL/min) 400 mL/min  Arterial Pressure (mmHg) -150 mmHg  Venous Pressure (mmHg) 150 mmHg  Transmembrane Pressure (mmHg) 70 mmHg  Ultrafiltration Rate (mL/min) 430 mL/min  Dialysate Flow Rate (mL/min) 800 ml/min  Conductivity: Machine  13.6  HD Safety Checks Performed Yes  Dialysis Fluid Bolus Normal Saline  Bolus Amount (mL) 250 mL  Intra-Hemodialysis Comments Tx initiated  Fistula / Graft Left Forearm Arteriovenous fistula  No Placement Date or Time found.   Placed prior to admission: Yes  Orientation: Left  Access Location: Forearm  Access Type: Arteriovenous fistula  Status Accessed  Needle Size 15

## 2018-09-17 ENCOUNTER — Telehealth (INDEPENDENT_AMBULATORY_CARE_PROVIDER_SITE_OTHER): Payer: Self-pay

## 2018-09-17 NOTE — Telephone Encounter (Signed)
Nurse called and stated that the patient is in their facility and that he is complaining of increased pain to the AKA and that he is having some drainage to the site.  What do you advise that we do?

## 2018-09-17 NOTE — Telephone Encounter (Signed)
It is certainly normal to have pain and slight drainage following an amputation.  What type of drainage is it? Is it a small or large amount? Is it serosanguinous, purulent, or sanguineous? Is it fibrinous exudate? Also, does he currently still have pain medicine available? If not, do they need [place] to send a new prescription?   It should be concerning if he is having sudden severe pain not relieved by pain medication. If there are color changes to the extremity such as sudden darkness, severe erythema, or mottling.  Also, if there is eschar forming (not a scab) in the wound bed or the development of a large open area.  Also, foul smelling drainage is cause for concern.

## 2018-09-17 NOTE — Telephone Encounter (Signed)
Arna Medici stated to put the patient on her schedule for this Friday to be seen.

## 2018-09-17 NOTE — Telephone Encounter (Signed)
Nurse states that the leg does have an odor like a pseudomonous, the room ha an odor when the unit manager walked in to ask him about his pain and drainage. Patient is requesting for an antibiotic, and he has not been asking for the pain medicine at all, according to the floor. He's only been concerned with getting an antibiotics to get rid of the odor.

## 2018-09-18 ENCOUNTER — Ambulatory Visit (INDEPENDENT_AMBULATORY_CARE_PROVIDER_SITE_OTHER): Payer: Medicare Other | Admitting: Nurse Practitioner

## 2018-09-18 ENCOUNTER — Encounter (INDEPENDENT_AMBULATORY_CARE_PROVIDER_SITE_OTHER): Payer: Self-pay | Admitting: Nurse Practitioner

## 2018-09-18 VITALS — BP 137/72 | HR 70 | Resp 18 | Ht 70.0 in | Wt 236.0 lb

## 2018-09-18 DIAGNOSIS — I998 Other disorder of circulatory system: Secondary | ICD-10-CM

## 2018-09-18 DIAGNOSIS — N186 End stage renal disease: Secondary | ICD-10-CM

## 2018-09-18 DIAGNOSIS — Z992 Dependence on renal dialysis: Secondary | ICD-10-CM

## 2018-09-18 DIAGNOSIS — Z89619 Acquired absence of unspecified leg above knee: Secondary | ICD-10-CM | POA: Insufficient documentation

## 2018-09-18 MED ORDER — GABAPENTIN 300 MG PO CAPS: 300.0000 mg | ORAL_CAPSULE | Freq: Three times a day (TID) | ORAL | 3 refills | Status: DC

## 2018-09-18 NOTE — Progress Notes (Signed)
Subjective:    Patient ID: [name], male    DOB: 20-Jun-1961, 57 y.o.   MRN: [id number] Chief Complaint  Patient presents with  . Follow-up    Check odor and discharge from [place] is a 57 y.o. male presents today after being contacted from his nursing facility, [facility] with concern for infection of his above-knee amputation.  The nurse stated that he had drainage and increased pain in his lower extremity.  It was also reported that he had a foul-smelling odor emanating from his wound as well.  The nurse stated that the patient and his wife are requesting antibiotics.  Upon speaking with the patient, he endorsed that he was having increased pain following a transfer using a sheet that he felt was too small which would rub his wound.  He stated that following this transfer he began to have much more drainage which was concerning to he and his wife.  He stated that the drainage was pink and watery.  He stated that there was no other color such as yellow, green, or cloudy white drainage.  The patient and his wife stated that there was no odor coming from the wound.  Examination of the wound and dressing showed serosanguineous drainage with no foul-smelling odor.  The patient and his wife are requesting antibiotics to increase healing of the wound as well as to prevent any infections that can occur.  This visit was conducted with the presence of a phone [place] speaking interpreter.  Past Medical History:  Diagnosis Date  . Anemia of chronic disease   . Atherosclerosis   . Atrial fibrillation with rapid ventricular response (HCC)    chadsvasc at least 6; new onset versus paroxysmal with first or most recent episode on 09/10/18  . CAD (coronary artery disease)    2017 LHC, severe 1v disease, occluded pRCA, L-R collaterals, moderate LV dysfunction EF 30-35  . Diabetes mellitus with complication ([place])   . End stage renal disease on dialysis ([place])    Labile BP  .  History of ventricular tachycardia    2017 polymorphic VT and cardiac arrest s/p angioplasty   . Hypercholesteremia   . Hyperlipidemia LDL goal <70   . Hypertension   . Labile blood pressure    on HD  . NSVT (nonsustained ventricular tachycardia) ([place])    08/2018 NSVT  . Peripheral arterial occlusive disease (HCC)    s/p L BKA, R AKA  . S/P AKA (above knee amputation) unilateral, right ([place])    08/2018  . S/P BKA (below knee amputation) unilateral, left ([place])    2015  . Stroke ([place])   . Systolic heart failure ([place])    2017 EF per LHC 30-35%    Past Surgical History:  Procedure Laterality Date  . A/V FISTULAGRAM Left 01/09/2017   Procedure: A/V Fistulagram;  Surgeon: [name], MD;  Location: [place] [facility];  Service: Cardiovascular;  Laterality: Left;  . A/V FISTULAGRAM Left 06/06/2017   Procedure: A/V Fistulagram;  Surgeon: [name], MD;  Location: [place] [facility];  Service: Cardiovascular;  Laterality: Left;  . A/V FISTULAGRAM Left 08/15/2017   Procedure: A/V Fistulagram;  Surgeon: [name], MD;  Location: [place] [facility];  Service: Cardiovascular;  Laterality: Left;  . A/V FISTULAGRAM Left 01/22/2018   Procedure: A/V FISTULAGRAM;  Surgeon: [name], MD;  Location: [place] [facility];  Service: Cardiovascular;  Laterality:  Left;  . A/V SHUNT INTERVENTION N/A 06/06/2017   Procedure: A/V SHUNT INTERVENTION;  Surgeon: [name], MD;  Location: [place] [facility];  Service: Cardiovascular;  Laterality: N/A;  . AMPUTATION Right 04/25/2016   Procedure: TOE AMPUTATION;  Surgeon: Katha Cabal, MD;  Location: [facility];  Service: Vascular;  Laterality: Right;  . AMPUTATION Right 09/09/2018   Procedure: AMPUTATION ABOVE KNEE;  Surgeon: [name], MD;  Location: [facility];  Service: Vascular;  Laterality: Right;  . APPLICATION OF WOUND VAC Right 04/25/2016   Procedure: APPLICATION OF WOUND VAC;  Surgeon: Katha Cabal, MD;  Location: [facility];   Service: Vascular;  Laterality: Right;  . AV FISTULA PLACEMENT Left 2015  . CARDIAC CATHETERIZATION N/A 04/30/2016   Procedure: Left Heart Cath and Coronary Angiography;  Surgeon: Wellington Hampshire, MD;  Location: [place] [facility];  Service: Cardiovascular;  Laterality: N/A;  . CATARACT EXTRACTION, BILATERAL    . LEG AMPUTATION BELOW KNEE Left   . LOWER EXTREMITY ANGIOGRAPHY Right 07/24/2018   Procedure: LOWER EXTREMITY ANGIOGRAPHY;  Surgeon: [name], MD;  Location: [place] [facility];  Service: Cardiovascular;  Laterality: Right;  . LOWER EXTREMITY ANGIOGRAPHY Right 08/13/2018   Procedure: LOWER EXTREMITY ANGIOGRAPHY;  Surgeon: [name], MD;  Location: [place] [facility];  Service: Cardiovascular;  Laterality: Right;  . LOWER EXTREMITY ANGIOGRAPHY Right 09/04/2018   Procedure: Lower Extremity Angiography;  Surgeon: [name], MD;  Location: [place] [facility];  Service: Cardiovascular;  Laterality: Right;  . PERIPHERAL VASCULAR CATHETERIZATION N/A 10/03/2015   Procedure: A/V Shuntogram/Fistulagram;  Surgeon: [name], MD;  Location: [place] [facility];  Service: Cardiovascular;  Laterality: N/A;  . PERIPHERAL VASCULAR CATHETERIZATION N/A 01/16/2016   Procedure: Abdominal Aortogram w/Lower Extremity;  Surgeon: [name], MD;  Location: [place] [facility];  Service: Cardiovascular;  Laterality: N/A;  . PERIPHERAL VASCULAR CATHETERIZATION  01/16/2016   Procedure: Lower Extremity Intervention;  Surgeon: [name], MD;  Location: [place] [facility];  Service: Cardiovascular;;  . PERIPHERAL VASCULAR CATHETERIZATION Right 04/27/2016   Procedure: Lower Extremity Angiography;  Surgeon: Katha Cabal, MD;  Location: [place] [facility];  Service: Cardiovascular;  Laterality: Right;  . PERIPHERAL VASCULAR CATHETERIZATION  04/27/2016   Procedure: Lower Extremity Intervention;  Surgeon: Katha Cabal, MD;  Location: [place] [facility];  Service: Cardiovascular;;  .  TRANSMETATARSAL AMPUTATION Right 04/28/2016   Procedure: TRANSMETATARSAL AMPUTATION;  Surgeon: Albertine Patricia, DPM;  Location: [facility];  Service: Podiatry;  Laterality: Right;  . WOUND DEBRIDEMENT Right 04/25/2016   Procedure: DEBRIDEMENT WOUND;  Surgeon: Katha Cabal, MD;  Location: [facility];  Service: Vascular;  Laterality: Right;    Social History   Socioeconomic History  . Marital status: Married    Spouse name: Not on file  . Number of children: Not on file  . Years of education: Not on file  . Highest education level: Not on file  Occupational History  . Not on file  Social Needs  . Financial resource strain: Not on file  . Food insecurity:    Worry: Patient refused    Inability: Patient refused  . Transportation needs:    Medical: No    Non-medical: No  Tobacco Use  . Smoking status: Never Smoker  . Smokeless tobacco: Never Used  Substance and Sexual Activity  . Alcohol use: No  . Drug use: No  . Sexual activity: Not on file  Lifestyle  . Physical activity:    Days per week: Not on file    Minutes per session: Not on file  . Stress: Not at all  Relationships  . Social connections:    Talks on phone: More than three times a week    Gets together: Three times a week    Attends religious service: Not on file    Active member of club or organization: Not on file    Attends meetings of clubs or organizations: Not on file    Relationship status: Not on file  . Intimate partner violence:    Fear of current or ex partner: No    Emotionally abused: Not on file    Physically abused: Not on file    Forced sexual activity: Not on file  Other Topics Concern  . Not on file  Social History Narrative  . Not on file    Family History  Problem Relation Age of Onset  . Diabetes Father   . Prostate cancer Father     No Known Allergies   Review of Systems   Review of Systems: Negative Unless Checked Constitutional: [] Weight loss  [] Fever  [] Chills Cardiac:  [] Chest pain   []  Atrial Fibrillation  [] Palpitations   [] Shortness of breath when laying flat   [] Shortness of breath with exertion. Vascular:  [] Pain in legs with walking   [] Pain in legs with standing  [] History of DVT   [] Phlebitis   [] Swelling in legs   [] Varicose veins   [] Non-healing ulcers Pulmonary:   [] Uses home oxygen   [] Productive cough   [] Hemoptysis   [] Wheeze  [] COPD   [] Asthma Neurologic:  [] Dizziness   [] Seizures   [] History of stroke   [] History of TIA  [] Aphasia   [] Vissual changes   [] Weakness or numbness in arm   [x] Weakness or numbness in leg Musculoskeletal:   [] Joint swelling   [] Joint pain   [] Low back pain  []  History of Knee Replacement Hematologic:  [] Easy bruising  [] Easy bleeding   [] Hypercoagulable state   [] Anemic Gastrointestinal:  [] Diarrhea   [] Vomiting  [] Gastroesophageal reflux/heartburn   [] Difficulty swallowing. Genitourinary:  [x] Chronic kidney disease   [] Difficult urination  [] Anuric   [] Blood in urine Skin:  [] Rashes   [] Ulcers  Psychological:  [x] History of anxiety   []  History of major depression  []  Memory Difficulties     Objective:   Physical Exam  BP 137/72 (BP Location: Right Arm, Patient Position: Sitting)   Pulse 70   Resp 18   Ht 5\' 10"  (1.778 m)   Wt 236 lb (107 kg)   BMI 33.86 kg/m   Gen: WD/WN, NAD Head: [place]/AT, No temporalis wasting.  Poor dentition Ear/Nose/Throat: Hearing grossly intact, nares w/o erythema or drainage Eyes: PER, EOMI, sclera nonicteric.  Neck: Supple, no masses.  No JVD.  Pulmonary:  Good air movement, no use of accessory muscles.  Cardiac: RRR Vascular:  Well approximated 17 week old incision scar of right above-the-knee amputation.  Swelling present in limb.  Evidence of serosanguineous drainage present.  Bandages were examined and only serosanguineous drainage was present. Vessel Right Left  Radial Palpable Palpable   Gastrointestinal: soft, non-distended. No guarding/no peritoneal signs.    Musculoskeletal: M/S 5/5 throughout.    Bilateral amputations.  Right AKA, left BKA Neurologic: Pain and light touch intact in extremities.  Symmetrical.  Speech is fluent. Motor exam as listed above. Psychiatric: Judgment intact, Mood & affect appropriate for pt's clinical situation. Dermatologic: No  Venous rashes. No Ulcers Noted.  No changes consistent with cellulitis. Lymph : No Cervical lymphadenopathy, no lichenification or skin changes of chronic lymphedema.      Assessment & Plan:   1. Limb ischemia The patient and his wife presented today with concern for the drainage that was coming from his recent amputation site.  They stated that recently there was an increased level drainage than what was happening previously.  Patient also stated that his wound her much more after being moved with a left that he felt was probably too small for him.  He stated that once he actually received pain medicine the pain was tolerable.  He also endorsed having some phantom limb pain.  Patient his wife also requested antibiotics because of the drainage coming from his lower extremity.  They also request antibiotics in order to "make the wound heal faster".  There is an additional push antibiotics in order to prevent any infection from occurring.  I had a long discussion with the patient and his wife as to the expected healing process and what is normal and what is not normal.  We also had a lengthy discussion regarding usage of antibiotics and why taking antibiotics without an infection would not be in the best interest of Mr. Halloran.  I discussed several potential negative effects of prophylactic antibiotic usage such as antibiotic resistance as well as possible C. difficile infection.  I educated them on the signs and symptoms that should watch for with infection, such as increased redness and warmth of the area, change in color of drainage such as to green, yellow, purulent.  I also spoke with him about  observing for the smell of the wound and increased foul-smelling odor may also indicate need for antibiotics.  In order to address his phantom limb pain I increased his dosage of gabapentin and sent him with a prescription to his facility.  I also made notation for the nursing staff to try to be more proactive and offering his pain medicine versus when he is giving it when he is in unbearable pain.  The patient has a follow-up appointment in approximately 2 weeks for staple removal. - gabapentin (NEURONTIN) 300 MG capsule; Take 1 capsule (300 mg total) by mouth 3 (three) times daily.  Dispense: 90 capsule; Refill: 3  2. ESRD on dialysis [facility]) Currently has no complaints with dialysis access.  Able to dialyze fine with no issues with bleeding time or flow volumes.  Patient will follow-up with HDA on regularly scheduled interval in approximately 5 months.   Current Outpatient Medications on File Prior to Visit  Medication Sig Dispense Refill  . amiodarone (PACERONE) 200 MG tablet Take 1 tablet (200 mg total) by mouth daily.    Marland Kitchen apixaban (ELIQUIS) 5 MG TABS tablet Take 1 tablet (5 mg total) by mouth 2 (two) times daily. 60 tablet 0  . aspirin EC 81 MG tablet Take 81 mg by mouth daily.    Marland Kitchen b complex-vitamin c-folic acid (NEPHRO-VITE) 0.8 MG TABS tablet Take 1 tablet by mouth daily.    . cinacalcet (SENSIPAR) 60 MG tablet Take 60 mg by mouth daily.     . ferrous sulfate 325 (65 FE) MG EC tablet Take 325 mg by mouth 3 (three) times daily with meals.    Marland Kitchen glipiZIDE (GLUCOTROL) 10 MG tablet Take 10 mg by mouth daily.     Marland Kitchen lidocaine-prilocaine (EMLA) cream Apply 1 application topically as needed (port access).    . metoprolol  tartrate (LOPRESSOR) 25 MG tablet Take 1 tablet (25 mg total) by mouth 2 (two) times daily. 30 tablet 0  . midodrine (PROAMATINE) 5 MG tablet Take 1 tablet (5 mg total) by mouth 3 (three) times daily with meals.    Marland Kitchen omeprazole (PRILOSEC) 40 MG capsule Take 40 mg by mouth 2  (two) times daily.     . polyethylene glycol (MIRALAX / GLYCOLAX) packet Take 17 g by mouth 2 (two) times daily.     . sevelamer carbonate (RENVELA) 800 MG tablet Take 2,400 mg by mouth 3 (three) times daily with meals.     Marland Kitchen oxyCODONE (OXY IR/ROXICODONE) 5 MG immediate release tablet Take 1 tablet (5 mg total) by mouth every 6 (six) hours as needed for moderate pain or severe pain. (Patient not taking: Reported on 09/18/2018) 10 tablet 0   No current facility-administered medications on file prior to visit.     There are no Patient Instructions on file for this visit. No follow-ups on file.   Kris Hartmann, NP  This note was completed with Sales executive.  Any errors are purely unintentional.

## 2018-09-19 ENCOUNTER — Ambulatory Visit (INDEPENDENT_AMBULATORY_CARE_PROVIDER_SITE_OTHER): Payer: Medicare Other | Admitting: Nurse Practitioner

## 2018-09-21 ENCOUNTER — Other Ambulatory Visit: Payer: Self-pay

## 2018-09-21 ENCOUNTER — Inpatient Hospital Stay
Admission: EM | Admit: 2018-09-21 | Discharge: 2018-09-28 | DRG: 871 | Disposition: A | Discharge status: Skilled Nursing Facility | Payer: Medicare Other | Attending: Internal Medicine | Admitting: Internal Medicine

## 2018-09-21 ENCOUNTER — Encounter: Payer: Self-pay | Admitting: *Deleted

## 2018-09-21 ENCOUNTER — Emergency Department: Payer: Medicare Other

## 2018-09-21 DIAGNOSIS — I480 Paroxysmal atrial fibrillation: Secondary | ICD-10-CM | POA: Diagnosis present

## 2018-09-21 DIAGNOSIS — E1122 Type 2 diabetes mellitus with diabetic chronic kidney disease: Secondary | ICD-10-CM | POA: Diagnosis present

## 2018-09-21 DIAGNOSIS — I2510 Atherosclerotic heart disease of native coronary artery without angina pectoris: Secondary | ICD-10-CM | POA: Diagnosis present

## 2018-09-21 DIAGNOSIS — Z89511 Acquired absence of right leg below knee: Secondary | ICD-10-CM | POA: Diagnosis not present

## 2018-09-21 DIAGNOSIS — Z992 Dependence on renal dialysis: Secondary | ICD-10-CM | POA: Diagnosis not present

## 2018-09-21 DIAGNOSIS — E7800 Pure hypercholesterolemia, unspecified: Secondary | ICD-10-CM | POA: Diagnosis present

## 2018-09-21 DIAGNOSIS — N179 Acute kidney failure, unspecified: Secondary | ICD-10-CM | POA: Diagnosis not present

## 2018-09-21 DIAGNOSIS — Z89611 Acquired absence of right leg above knee: Secondary | ICD-10-CM | POA: Diagnosis not present

## 2018-09-21 DIAGNOSIS — Z89612 Acquired absence of left leg above knee: Secondary | ICD-10-CM

## 2018-09-21 DIAGNOSIS — N186 End stage renal disease: Secondary | ICD-10-CM | POA: Diagnosis present

## 2018-09-21 DIAGNOSIS — I959 Hypotension, unspecified: Secondary | ICD-10-CM | POA: Diagnosis not present

## 2018-09-21 DIAGNOSIS — F4322 Adjustment disorder with anxiety: Secondary | ICD-10-CM | POA: Diagnosis not present

## 2018-09-21 DIAGNOSIS — G253 Myoclonus: Secondary | ICD-10-CM | POA: Diagnosis present

## 2018-09-21 DIAGNOSIS — Z7901 Long term (current) use of anticoagulants: Secondary | ICD-10-CM

## 2018-09-21 DIAGNOSIS — I482 Chronic atrial fibrillation, unspecified: Secondary | ICD-10-CM | POA: Diagnosis present

## 2018-09-21 DIAGNOSIS — I132 Hypertensive heart and chronic kidney disease with heart failure and with stage 5 chronic kidney disease, or end stage renal disease: Secondary | ICD-10-CM | POA: Diagnosis present

## 2018-09-21 DIAGNOSIS — I340 Nonrheumatic mitral (valve) insufficiency: Secondary | ICD-10-CM | POA: Diagnosis not present

## 2018-09-21 DIAGNOSIS — E1152 Type 2 diabetes mellitus with diabetic peripheral angiopathy with gangrene: Secondary | ICD-10-CM | POA: Diagnosis present

## 2018-09-21 DIAGNOSIS — Z6835 Body mass index (BMI) 35.0-35.9, adult: Secondary | ICD-10-CM

## 2018-09-21 DIAGNOSIS — H6691 Otitis media, unspecified, right ear: Secondary | ICD-10-CM | POA: Diagnosis present

## 2018-09-21 DIAGNOSIS — L8990 Pressure ulcer of unspecified site, unspecified stage: Secondary | ICD-10-CM | POA: Diagnosis present

## 2018-09-21 DIAGNOSIS — I5042 Chronic combined systolic (congestive) and diastolic (congestive) heart failure: Secondary | ICD-10-CM | POA: Diagnosis present

## 2018-09-21 DIAGNOSIS — E875 Hyperkalemia: Secondary | ICD-10-CM | POA: Diagnosis present

## 2018-09-21 DIAGNOSIS — E785 Hyperlipidemia, unspecified: Secondary | ICD-10-CM | POA: Diagnosis present

## 2018-09-21 DIAGNOSIS — A419 Sepsis, unspecified organism: Secondary | ICD-10-CM | POA: Diagnosis present

## 2018-09-21 DIAGNOSIS — I248 Other forms of acute ischemic heart disease: Secondary | ICD-10-CM | POA: Diagnosis present

## 2018-09-21 DIAGNOSIS — D631 Anemia in chronic kidney disease: Secondary | ICD-10-CM | POA: Diagnosis present

## 2018-09-21 DIAGNOSIS — Z8673 Personal history of transient ischemic attack (TIA), and cerebral infarction without residual deficits: Secondary | ICD-10-CM

## 2018-09-21 DIAGNOSIS — M549 Dorsalgia, unspecified: Secondary | ICD-10-CM | POA: Diagnosis not present

## 2018-09-21 DIAGNOSIS — I739 Peripheral vascular disease, unspecified: Secondary | ICD-10-CM | POA: Diagnosis not present

## 2018-09-21 DIAGNOSIS — I361 Nonrheumatic tricuspid (valve) insufficiency: Secondary | ICD-10-CM | POA: Diagnosis not present

## 2018-09-21 DIAGNOSIS — I313 Pericardial effusion (noninflammatory): Secondary | ICD-10-CM | POA: Diagnosis present

## 2018-09-21 DIAGNOSIS — N2581 Secondary hyperparathyroidism of renal origin: Secondary | ICD-10-CM | POA: Diagnosis present

## 2018-09-21 DIAGNOSIS — Z7982 Long term (current) use of aspirin: Secondary | ICD-10-CM

## 2018-09-21 DIAGNOSIS — E669 Obesity, unspecified: Secondary | ICD-10-CM | POA: Diagnosis present

## 2018-09-21 DIAGNOSIS — Z515 Encounter for palliative care: Secondary | ICD-10-CM | POA: Diagnosis not present

## 2018-09-21 DIAGNOSIS — Z7189 Other specified counseling: Secondary | ICD-10-CM | POA: Diagnosis not present

## 2018-09-21 LAB — CBC
HCT: 26.8 % — ABNORMAL LOW (ref 39.0–52.0)
Hemoglobin: 8 g/dL — ABNORMAL LOW (ref 13.0–17.0)
MCH: 29.4 pg (ref 26.0–34.0)
MCHC: 29.9 g/dL — ABNORMAL LOW (ref 30.0–36.0)
MCV: 98.5 fL (ref 80.0–100.0)
NRBC: 0 % (ref 0.0–0.2)
Platelets: 576 10*3/uL — ABNORMAL HIGH (ref 150–400)
RBC: 2.72 MIL/uL — ABNORMAL LOW (ref 4.22–5.81)
RDW: 16.1 % — ABNORMAL HIGH (ref 11.5–15.5)
WBC: 28.1 10*3/uL — ABNORMAL HIGH (ref 4.0–10.5)

## 2018-09-21 LAB — BASIC METABOLIC PANEL
Anion gap: 12 (ref 5–15)
BUN: 61 mg/dL — ABNORMAL HIGH (ref 6–20)
CO2: 27 mmol/L (ref 22–32)
Calcium: 8.5 mg/dL — ABNORMAL LOW (ref 8.9–10.3)
Chloride: 93 mmol/L — ABNORMAL LOW (ref 98–111)
Creatinine, Ser: 8.07 mg/dL — ABNORMAL HIGH (ref 0.61–1.24)
GFR calc Af Amer: 8 mL/min — ABNORMAL LOW (ref >60)
GFR calc non Af Amer: 7 mL/min — ABNORMAL LOW (ref >60)
GLUCOSE: 254 mg/dL — AB (ref 70–99)
POTASSIUM: 6.3 mmol/L — AB (ref 3.5–5.1)
Sodium: 132 mmol/L — ABNORMAL LOW (ref 135–145)

## 2018-09-21 LAB — CG4 I-STAT (LACTIC ACID)
Lactic Acid, Venous: 2.3 mmol/L (ref 0.5–1.9)
Lactic Acid, Venous: 0.65 mmol/L (ref 0.5–1.9)

## 2018-09-21 LAB — INFLUENZA PANEL BY PCR (TYPE A & B)
Influenza A By PCR: NEGATIVE
Influenza B By PCR: NEGATIVE

## 2018-09-21 LAB — LACTIC ACID, PLASMA: Lactic Acid, Venous: 2.3 mmol/L (ref 0.5–1.9)

## 2018-09-21 LAB — TROPONIN I: Troponin I: 0.06 ng/mL (ref <0.03)

## 2018-09-21 LAB — GLUCOSE, CAPILLARY: Glucose-Capillary: 200 mg/dL — ABNORMAL HIGH (ref 70–99)

## 2018-09-21 MED ORDER — SODIUM CHLORIDE 0.9% FLUSH
3.0000 mL | Freq: Two times a day (BID) | INTRAVENOUS | Status: DC
  Administered 2018-09-21 – 2018-09-28 (×13): 3 mL via INTRAVENOUS

## 2018-09-21 MED ORDER — ASPIRIN EC 81 MG PO TBEC
81.0000 mg | DELAYED_RELEASE_TABLET | Freq: Every day | ORAL | Status: DC
  Administered 2018-09-22 – 2018-09-28 (×7): 81 mg via ORAL
  Filled 2018-09-21 (×7): qty 1

## 2018-09-21 MED ORDER — VANCOMYCIN HCL IN DEXTROSE 1-5 GM/200ML-% IV SOLN
1000.0000 mg | INTRAVENOUS | Status: DC
  Administered 2018-09-22 – 2018-09-26 (×3): 1000 mg via INTRAVENOUS
  Filled 2018-09-21 (×5): qty 200

## 2018-09-21 MED ORDER — SODIUM CHLORIDE 0.9 % IV SOLN
250.0000 mL | INTRAVENOUS | Status: DC | PRN
  Administered 2018-09-21 – 2018-09-24 (×4): 250 mL via INTRAVENOUS

## 2018-09-21 MED ORDER — ACETAMINOPHEN 325 MG PO TABS
650.0000 mg | ORAL_TABLET | Freq: Four times a day (QID) | ORAL | Status: DC | PRN
  Administered 2018-09-23 – 2018-09-26 (×2): 650 mg via ORAL
  Filled 2018-09-21 (×3): qty 2

## 2018-09-21 MED ORDER — AMIODARONE HCL 200 MG PO TABS
200.0000 mg | ORAL_TABLET | Freq: Every day | ORAL | Status: DC
  Administered 2018-09-22 – 2018-09-26 (×5): 200 mg via ORAL
  Filled 2018-09-21 (×5): qty 1

## 2018-09-21 MED ORDER — FERROUS SULFATE 325 (65 FE) MG PO TABS
325.0000 mg | ORAL_TABLET | Freq: Three times a day (TID) | ORAL | Status: DC
  Administered 2018-09-22 – 2018-09-28 (×14): 325 mg via ORAL
  Filled 2018-09-21 (×15): qty 1

## 2018-09-21 MED ORDER — PATIROMER SORBITEX CALCIUM 8.4 G PO PACK
8.4000 g | PACK | Freq: Every day | ORAL | Status: DC
  Administered 2018-09-22: 8.4 g via ORAL
  Filled 2018-09-21 (×3): qty 1

## 2018-09-21 MED ORDER — APIXABAN 5 MG PO TABS
5.0000 mg | ORAL_TABLET | Freq: Two times a day (BID) | ORAL | Status: DC
  Administered 2018-09-21 – 2018-09-24 (×6): 5 mg via ORAL
  Filled 2018-09-21 (×6): qty 1

## 2018-09-21 MED ORDER — SODIUM CHLORIDE 0.9 % IV SOLN
2.0000 g | Freq: Once | INTRAVENOUS | Status: AC
  Administered 2018-09-21: 2 g via INTRAVENOUS
  Filled 2018-09-21: qty 2

## 2018-09-21 MED ORDER — SENNOSIDES-DOCUSATE SODIUM 8.6-50 MG PO TABS
1.0000 | ORAL_TABLET | Freq: Two times a day (BID) | ORAL | Status: DC
  Administered 2018-09-21 – 2018-09-28 (×12): 1 via ORAL
  Filled 2018-09-21 (×12): qty 1

## 2018-09-21 MED ORDER — MIDODRINE HCL 5 MG PO TABS
5.0000 mg | ORAL_TABLET | Freq: Three times a day (TID) | ORAL | Status: DC
  Administered 2018-09-22 – 2018-09-27 (×14): 5 mg via ORAL
  Filled 2018-09-21 (×20): qty 1

## 2018-09-21 MED ORDER — POLYETHYLENE GLYCOL 3350 17 G PO PACK: 17.0000 g | PACK | Freq: Every day | ORAL | Status: DC | PRN

## 2018-09-21 MED ORDER — SODIUM POLYSTYRENE SULFONATE 15 GM/60ML PO SUSP: 30.0000 g | Freq: Once | ORAL | Status: DC

## 2018-09-21 MED ORDER — SODIUM CHLORIDE 0.9 % IV BOLUS
1000.0000 mL | Freq: Once | INTRAVENOUS | Status: AC
  Administered 2018-09-21: 1000 mL via INTRAVENOUS

## 2018-09-21 MED ORDER — HYDRALAZINE HCL 20 MG/ML IJ SOLN: 10.0000 mg | INTRAMUSCULAR | Status: DC | PRN

## 2018-09-21 MED ORDER — SODIUM CHLORIDE 0.9 % IV SOLN
1.0000 g | INTRAVENOUS | Status: DC
  Administered 2018-09-22 – 2018-09-24 (×3): 1 g via INTRAVENOUS
  Filled 2018-09-21 (×4): qty 1

## 2018-09-21 MED ORDER — GABAPENTIN 300 MG PO CAPS
300.0000 mg | ORAL_CAPSULE | Freq: Three times a day (TID) | ORAL | Status: DC
  Administered 2018-09-21 – 2018-09-23 (×4): 300 mg via ORAL
  Filled 2018-09-21 (×4): qty 1

## 2018-09-21 MED ORDER — LIDOCAINE-PRILOCAINE 2.5-2.5 % EX CREA
1.0000 "application " | TOPICAL_CREAM | CUTANEOUS | Status: DC | PRN
  Filled 2018-09-21 (×2): qty 5

## 2018-09-21 MED ORDER — INSULIN ASPART 100 UNIT/ML ~~LOC~~ SOLN
0.0000 [IU] | Freq: Three times a day (TID) | SUBCUTANEOUS | Status: DC
  Administered 2018-09-22 (×2): 5 [IU] via SUBCUTANEOUS
  Administered 2018-09-23 (×3): 3 [IU] via SUBCUTANEOUS
  Administered 2018-09-24: 8 [IU] via SUBCUTANEOUS
  Administered 2018-09-25 – 2018-09-26 (×4): 3 [IU] via SUBCUTANEOUS
  Administered 2018-09-26 – 2018-09-27 (×2): 5 [IU] via SUBCUTANEOUS
  Administered 2018-09-27: 2 [IU] via SUBCUTANEOUS
  Administered 2018-09-27 – 2018-09-28 (×2): 3 [IU] via SUBCUTANEOUS
  Filled 2018-09-21 (×16): qty 1

## 2018-09-21 MED ORDER — SODIUM CHLORIDE 0.9 % IV SOLN
1.0000 g | Freq: Once | INTRAVENOUS | Status: AC
  Administered 2018-09-21: 1 g via INTRAVENOUS
  Filled 2018-09-21: qty 10

## 2018-09-21 MED ORDER — CINACALCET HCL 30 MG PO TABS
60.0000 mg | ORAL_TABLET | Freq: Every day | ORAL | Status: DC
  Administered 2018-09-22: 60 mg via ORAL
  Filled 2018-09-21 (×2): qty 2

## 2018-09-21 MED ORDER — ONDANSETRON HCL 4 MG PO TABS: 4.0000 mg | ORAL_TABLET | Freq: Four times a day (QID) | ORAL | Status: DC | PRN

## 2018-09-21 MED ORDER — POLYETHYLENE GLYCOL 3350 17 G PO PACK
17.0000 g | PACK | Freq: Two times a day (BID) | ORAL | Status: DC
  Administered 2018-09-22 – 2018-09-28 (×10): 17 g via ORAL
  Filled 2018-09-21 (×12): qty 1

## 2018-09-21 MED ORDER — ONDANSETRON HCL 4 MG/2ML IJ SOLN
4.0000 mg | Freq: Four times a day (QID) | INTRAMUSCULAR | Status: DC | PRN
  Administered 2018-09-25 – 2018-09-28 (×4): 4 mg via INTRAVENOUS
  Filled 2018-09-21 (×5): qty 2

## 2018-09-21 MED ORDER — PANTOPRAZOLE SODIUM 40 MG PO TBEC
40.0000 mg | DELAYED_RELEASE_TABLET | Freq: Every day | ORAL | Status: DC
  Administered 2018-09-22 – 2018-09-28 (×6): 40 mg via ORAL
  Filled 2018-09-21 (×6): qty 1

## 2018-09-21 MED ORDER — GLIPIZIDE 10 MG PO TABS
10.0000 mg | ORAL_TABLET | Freq: Every day | ORAL | Status: DC
  Administered 2018-09-22 – 2018-09-28 (×6): 10 mg via ORAL
  Filled 2018-09-21 (×7): qty 1

## 2018-09-21 MED ORDER — SODIUM CHLORIDE 0.9% FLUSH: 3.0000 mL | INTRAVENOUS | Status: DC | PRN

## 2018-09-21 MED ORDER — VANCOMYCIN HCL IN DEXTROSE 1-5 GM/200ML-% IV SOLN
1000.0000 mg | Freq: Once | INTRAVENOUS | Status: AC
  Administered 2018-09-21: 1000 mg via INTRAVENOUS
  Filled 2018-09-21: qty 200

## 2018-09-21 MED ORDER — INSULIN ASPART 100 UNIT/ML ~~LOC~~ SOLN
0.0000 [IU] | Freq: Every day | SUBCUTANEOUS | Status: DC
  Administered 2018-09-22 – 2018-09-26 (×4): 2 [IU] via SUBCUTANEOUS
  Filled 2018-09-21 (×4): qty 1

## 2018-09-21 MED ORDER — SEVELAMER CARBONATE 800 MG PO TABS
2400.0000 mg | ORAL_TABLET | Freq: Three times a day (TID) | ORAL | Status: DC
  Administered 2018-09-22 – 2018-09-28 (×15): 2400 mg via ORAL
  Filled 2018-09-21 (×17): qty 3

## 2018-09-21 MED ORDER — OXYCODONE HCL 5 MG PO TABS
5.0000 mg | ORAL_TABLET | Freq: Three times a day (TID) | ORAL | Status: DC | PRN
  Administered 2018-09-22 – 2018-09-24 (×3): 5 mg via ORAL
  Filled 2018-09-21 (×7): qty 1

## 2018-09-21 MED ORDER — RENA-VITE PO TABS
1.0000 | ORAL_TABLET | Freq: Every day | ORAL | Status: DC
  Administered 2018-09-21 – 2018-09-27 (×7): 1 via ORAL
  Filled 2018-09-21 (×7): qty 1

## 2018-09-21 MED ORDER — PRO-STAT SUGAR FREE PO LIQD
30.0000 mL | Freq: Every day | ORAL | Status: DC
  Administered 2018-09-26 – 2018-09-28 (×3): 30 mL via ORAL

## 2018-09-21 NOTE — ED Notes (Signed)
This Probation officer completed I-STAT LAC. Pt LAC levels were 2.30. This tech also notified RN Becky Sax about levels at this time as well!

## 2018-09-21 NOTE — Consult Note (Signed)
CODE SEPSIS - PHARMACY COMMUNICATION  **Broad Spectrum Antibiotics should be administered within 1 hour of Sepsis diagnosis**  Time Code Sepsis Called/Page Received: 1602  Antibiotics Ordered: Vancomycin and Cefepime  Time of 1st antibiotic administration: 7276  Additional action taken by pharmacy: Called when orders not placed in first 30 min from page  If necessary, Name of Provider/Nurse Contacted: Spoke with Katharine Look (may recall code sepsis - they considered a different strategy, but then followed through with code sepsis)    [name], PharmD, BCPS Clinical Pharmacist 09/21/2018 5:21 PM

## 2018-09-21 NOTE — ED Notes (Signed)
Dr. Cherylann Banas aware of need for 2nd Lactic acid.

## 2018-09-21 NOTE — ED Notes (Signed)
This tech collected I-STAT LAC at this time. Pt hd infusion running which wasn't stop. RN Made aware of the situation and also what pt levels were by this Probation officer and also Camera operator.   I-STAT is to be redrawn.

## 2018-09-21 NOTE — ED Provider Notes (Signed)
[facility] Emergency Department Provider Note ____________________________________________   First MD Initiated Contact with Patient 09/21/18 1541     (approximate)  I have reviewed the triage vital signs and the nursing notes.   HISTORY  Chief Complaint Chest Pain and Fever  Level 5 caveat: History of present illness limited by poor historian  HPI [name] is a 57 y.o. 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  Past Medical History:  Diagnosis Date  . Anemia of chronic disease   . Atherosclerosis   . Atrial fibrillation with rapid ventricular response (HCC)    chadsvasc at least 6; new onset versus paroxysmal with first or most recent episode on 09/10/18  . CAD (coronary artery disease)    2017 LHC, severe 1v disease, occluded pRCA, L-R collaterals, moderate LV dysfunction EF 30-35  . Diabetes mellitus with complication ([place])   . End stage renal disease on dialysis ([place])    Labile BP  . History of ventricular tachycardia    2017 polymorphic VT and cardiac arrest s/p angioplasty   . Hypercholesteremia   . Hyperlipidemia LDL goal <70   . Hypertension   . Labile blood pressure    on HD  . NSVT (nonsustained ventricular tachycardia) ([place])    08/2018 NSVT  . Peripheral arterial occlusive disease (HCC)    s/p L BKA, R AKA  . S/P AKA (above knee amputation) unilateral, right ([place])    08/2018  . S/P BKA (below knee amputation) unilateral, left ([place])    2015  . Stroke ([place])   . Systolic heart failure ([place])    2017 EF per LHC 30-35%    Patient Active Problem List   Diagnosis Date Noted  . Sepsis ([place]) 09/21/2018  . Status post above-knee amputation ([place]) 09/18/2018  . End stage renal disease on dialysis ([place])   . NSVT  (nonsustained ventricular tachycardia) ([place])   . Atrial fibrillation with rapid ventricular response ([place])   . Demand ischemia ([place])   . Limb ischemia 09/03/2018  . Arterial occlusion 08/12/2018  . ESRD on dialysis ([place]) 11/09/2016  . Diabetes ([place]) 11/09/2016  . PVD (peripheral vascular disease) ([place]) 11/09/2016  . Cardiac arrest ([place]) 04/28/2016  . Atherosclerosis of extremity with gangrene ([place]) 04/25/2016  . Diabetic polyneuropathy associated with type 2 diabetes mellitus ([place]) 12/09/2015  . Diabetic ulcer of right foot associated with type 2 diabetes mellitus ([place]) 12/09/2015  . Anemia 03/24/2014  . Left foot burn 03/17/2014  . Leukocytosis 03/17/2014  . Obesity 03/17/2014  . PND (paroxysmal nocturnal dyspnea) 06/29/2013  . Volume overload 06/29/2013  . Diastolic congestive heart failure, NYHA class 2 ([place]) 06/28/2013  . Shortness of breath 06/28/2013  . After-cataract with vision obscured 08/01/2011  . Anisometropia 08/01/2011  . Background diabetic retinopathy ([place]) 08/01/2011  . Senile nuclear sclerosis 08/01/2011    Past Surgical History:  Procedure Laterality Date  . A/V FISTULAGRAM Left 01/09/2017   Procedure: A/V Fistulagram;  Surgeon: [name], MD;  Location: [facility];  Service: Cardiovascular;  Laterality: Left;  . A/V FISTULAGRAM Left 06/06/2017   Procedure: A/V Fistulagram;  Surgeon: [name], MD;  Location: [facility];  Service: Cardiovascular;  Laterality: Left;  . A/V FISTULAGRAM Left  08/15/2017   Procedure: A/V Fistulagram;  Surgeon: [name], MD;  Location: [facility];  Service: Cardiovascular;  Laterality: Left;  . A/V FISTULAGRAM Left 01/22/2018   Procedure: A/V FISTULAGRAM;  Surgeon: [name], MD;  Location: [facility];  Service: Cardiovascular;  Laterality: Left;  . A/V SHUNT INTERVENTION N/A 06/06/2017   Procedure: A/V SHUNT INTERVENTION;  Surgeon: [name], MD;  Location: [facility];   Service: Cardiovascular;  Laterality: N/A;  . AMPUTATION Right 04/25/2016   Procedure: TOE AMPUTATION;  Surgeon: Katha Cabal, MD;  Location: [facility];  Service: Vascular;  Laterality: Right;  . AMPUTATION Right 09/09/2018   Procedure: AMPUTATION ABOVE KNEE;  Surgeon: [name], MD;  Location: [facility];  Service: Vascular;  Laterality: Right;  . APPLICATION OF WOUND VAC Right 04/25/2016   Procedure: APPLICATION OF WOUND VAC;  Surgeon: Katha Cabal, MD;  Location: [facility];  Service: Vascular;  Laterality: Right;  . AV FISTULA PLACEMENT Left 2015  . CARDIAC CATHETERIZATION N/A 04/30/2016   Procedure: Left Heart Cath and Coronary Angiography;  Surgeon: Wellington [place], MD;  Location: [facility];  Service: Cardiovascular;  Laterality: N/A;  . CATARACT EXTRACTION, BILATERAL    . LEG AMPUTATION BELOW KNEE Left   . LOWER EXTREMITY ANGIOGRAPHY Right 07/24/2018   Procedure: LOWER EXTREMITY ANGIOGRAPHY;  Surgeon: [name], MD;  Location: [facility];  Service: Cardiovascular;  Laterality: Right;  . LOWER EXTREMITY ANGIOGRAPHY Right 08/13/2018   Procedure: LOWER EXTREMITY ANGIOGRAPHY;  Surgeon: [name], MD;  Location: [place] [facility];  Service: Cardiovascular;  Laterality: Right;  . LOWER EXTREMITY ANGIOGRAPHY Right 09/04/2018   Procedure: Lower Extremity Angiography;  Surgeon: [name], MD;  Location: [facility];  Service: Cardiovascular;  Laterality: Right;  . PERIPHERAL VASCULAR CATHETERIZATION N/A 10/03/2015   Procedure: A/V Shuntogram/Fistulagram;  Surgeon: [name], MD;  Location: [facility];  Service: Cardiovascular;  Laterality: N/A;  . PERIPHERAL VASCULAR CATHETERIZATION N/A 01/16/2016   Procedure: Abdominal Aortogram w/Lower Extremity;  Surgeon: [name], MD;  Location: [facility];  Service: Cardiovascular;  Laterality: N/A;  . PERIPHERAL VASCULAR CATHETERIZATION  01/16/2016   Procedure: Lower Extremity  Intervention;  Surgeon: [name], MD;  Location: [facility];  Service: Cardiovascular;;  . PERIPHERAL VASCULAR CATHETERIZATION Right 04/27/2016   Procedure: Lower Extremity Angiography;  Surgeon: Katha Cabal, MD;  Location: [facility];  Service: Cardiovascular;  Laterality: Right;  . PERIPHERAL VASCULAR CATHETERIZATION  04/27/2016   Procedure: Lower Extremity Intervention;  Surgeon: Katha Cabal, MD;  Location: [facility];  Service: Cardiovascular;;  . TRANSMETATARSAL AMPUTATION Right 04/28/2016   Procedure: TRANSMETATARSAL AMPUTATION;  Surgeon: Albertine Patricia, DPM;  Location: [facility];  Service: Podiatry;  Laterality: Right;  . WOUND DEBRIDEMENT Right 04/25/2016   Procedure: DEBRIDEMENT WOUND;  Surgeon: Katha Cabal, MD;  Location: [facility];  Service: Vascular;  Laterality: Right;    Prior to Admission medications   Medication Sig Start Date End Date Taking? Authorizing Provider  acetaminophen (TYLENOL) 325 MG tablet Take 650 mg by mouth every 6 (six) hours as needed.   Yes [provider]  Amino Acids-Protein Hydrolys (FEEDING SUPPLEMENT, PRO-STAT SUGAR FREE 64,) LIQD Take 30 mLs by mouth daily.   Yes [provider]  amiodarone (PACERONE) 200 MG tablet Take 1 tablet (200 mg total) by mouth daily. 09/15/18  Yes Hillary Bow, MD  apixaban (ELIQUIS) 5 MG TABS tablet Take 1 tablet (5 mg total) by mouth 2 (two) times daily. 09/15/18  Yes Hillary Bow, MD  aspirin EC 81 MG tablet Take 81 mg by mouth daily.   Yes [provider]  b complex-vitamin c-folic acid (NEPHRO-VITE) 0.8 MG TABS tablet Take 1 tablet by mouth daily.   Yes [provider]  cinacalcet (SENSIPAR) 60 MG tablet Take 60 mg by mouth daily.    Yes [provider]  ferrous sulfate 325 (65 FE) MG EC tablet Take 325 mg by mouth 3 (three) times daily with meals.   Yes [provider]  gabapentin (NEURONTIN) 300 MG capsule Take 1 capsule  (300 mg total) by mouth 3 (three) times daily. 09/18/18  Yes Kris Hartmann, NP  glipiZIDE (GLUCOTROL) 10 MG tablet Take 10 mg by mouth daily.  10/25/16  Yes [provider]  lidocaine-prilocaine (EMLA) cream Apply 1 application topically as needed (port access).   Yes [provider]  metoprolol tartrate (LOPRESSOR) 25 MG tablet Take 1 tablet (25 mg total) by mouth 2 (two) times daily. 09/15/18  Yes Sudini, Alveta Heimlich, MD  midodrine (PROAMATINE) 5 MG tablet Take 1 tablet (5 mg total) by mouth 3 (three) times daily with meals. 09/15/18  Yes Sudini, Alveta Heimlich, MD  omeprazole (PRILOSEC) 40 MG capsule Take 40 mg by mouth 2 (two) times daily.    Yes [provider]  oxyCODONE (OXY IR/ROXICODONE) 5 MG immediate release tablet Take 1 tablet (5 mg total) by mouth every 6 (six) hours as needed for moderate pain or severe pain. 09/15/18  Yes Sudini, Alveta Heimlich, MD  polyethylene glycol (MIRALAX / GLYCOLAX) packet Take 17 g by mouth 2 (two) times daily.    Yes [provider]  senna-docusate (SENOKOT-S) 8.6-50 MG tablet Take 1 tablet by mouth 2 (two) times daily.   Yes [provider]  sevelamer carbonate (RENVELA) 800 MG tablet Take 2,400 mg by mouth 3 (three) times daily with meals.    Yes [provider]    Allergies Patient has no known allergies.  Family History  Problem Relation Age of Onset  . Diabetes Father   . Prostate cancer Father     Social History Social History   Tobacco Use  . Smoking status: Never Smoker  . Smokeless tobacco: Never Used  Substance Use Topics  . Alcohol use: No  . Drug use: No    Review of Systems Level 5 caveat: Review of systems limited due to poor historian Constitutional: No fever. Eyes: No redness. Cardiovascular: Positive for chest pain. Respiratory: Denies shortness of breath. Gastrointestinal: No vomiting. Musculoskeletal: Negative for back pain. Skin: Negative for rash. Neurological: Negative for  headache.   ____________________________________________   PHYSICAL EXAM:  VITAL SIGNS: ED Triage Vitals  Enc Vitals Group     BP 09/21/18 1518 (!) 96/56     Pulse Rate 09/21/18 1518 82     Resp 09/21/18 1518 18     Temp 09/21/18 1518 (!) 100.9 F (38.3 C)     Temp Source 09/21/18 1518 Oral     SpO2 09/21/18 1518 96 %     Weight 09/21/18 1519 225 lb (102.1 kg)     Height 09/21/18 1519 5\' 10"  (1.778 m)     Head Circumference --      Peak Flow --      Pain Score 09/21/18 1519 0     Pain Loc --  Pain Edu? --      Excl. in [place]? --     Constitutional: Alert and oriented.  Uncomfortable and weak appearing but in no acute distress. Eyes: Conjunctivae are normal.  Head: Atraumatic. Nose: No congestion/rhinnorhea. Mouth/Throat: Mucous membranes are dry.   Neck: Normal range of motion.  Cardiovascular: Normal rate, regular rhythm. Grossly normal heart sounds.  Good peripheral circulation. Respiratory: Normal respiratory effort.  No retractions. Lungs CTAB. Gastrointestinal: Soft and nontender. No distention.  Genitourinary: No flank tenderness. Musculoskeletal: No lower extremity edema.  Extremities warm and well perfused.  Neurologic:  Normal speech and language.  Motor intact in all extremities.  Normal coordination.  Twitching movement in the left arm.  Possible intermittent faint twitching of the facial muscles especially on the left. Skin:  Skin is warm and dry. No rash noted. Psychiatric: Mood and affect are normal. Speech and behavior are normal.  ____________________________________________   LABS (all labs ordered are listed, but only abnormal results are displayed)  Labs Reviewed  BASIC METABOLIC PANEL - Abnormal; Notable for the following components:      Result Value   Sodium 132 (*)    Potassium 6.3 (*)    Chloride 93 (*)    Glucose, Bld 254 (*)    BUN 61 (*)    Creatinine, Ser 8.07 (*)    Calcium 8.5 (*)    GFR calc non Af Amer 7 (*)    GFR calc Af  Amer 8 (*)    All other components within normal limits  CBC - Abnormal; Notable for the following components:   WBC 28.1 (*)    RBC 2.72 (*)    Hemoglobin 8.0 (*)    HCT 26.8 (*)    MCHC 29.9 (*)    RDW 16.1 (*)    Platelets 576 (*)    All other components within normal limits  TROPONIN I - Abnormal; Notable for the following components:   Troponin I 0.06 (*)    All other components within normal limits  CG4 I-STAT (LACTIC ACID) - Abnormal; Notable for the following components:   Lactic Acid, Venous 2.30 (*)    All other components within normal limits  CULTURE, BLOOD (ROUTINE X 2)  CULTURE, BLOOD (ROUTINE X 2)  INFLUENZA PANEL BY PCR (TYPE A & B)  I-STAT CG4 LACTIC ACID, ED   ____________________________________________  EKG  ED ECG REPORT I, Arta Silence, the attending physician, personally viewed and interpreted this ECG.  Date: 09/21/2018 EKG Time: 1521 Rate: 82 Rhythm: normal sinus rhythm QRS Axis: normal Intervals: Nonspecific IVCD ST/T Wave abnormalities: normal Narrative Interpretation: no evidence of acute ischemia  ____________________________________________  RADIOLOGY  CXR: Cardiomegaly with pulmonary vascular congestion CT head: No ICH or other acute findings  ____________________________________________   PROCEDURES  Procedure(s) performed: No  Procedures  Critical Care performed: Yes  CRITICAL CARE Performed by: Arta Silence   Total critical care time: 30 minutes  Critical care time was exclusive of separately billable procedures and treating other patients.  Critical care was necessary to treat or prevent imminent or life-threatening deterioration.  Critical care was time spent personally by me on the following activities: development of treatment plan with patient and/or surrogate as well as nursing, discussions with consultants, evaluation of patient's response to treatment, examination of patient, obtaining history from  patient or surrogate, ordering and performing treatments and interventions, ordering and review of laboratory studies, ordering and review of radiographic studies, pulse oximetry and re-evaluation of patient's condition.  ____________________________________________  INITIAL IMPRESSION / ASSESSMENT AND PLAN / ED COURSE  Pertinent labs & imaging results that were available during my care of the patient were reviewed by me and considered in my medical decision making (see chart for details).  57 year old male with PMH as noted above including ESRD on dialysis and recent admission for limb ischemia with right below-knee amputation presents with fever, chest pain, and muscle spasms/twitching.  The symptoms began today.  On exam the patient is uncomfortable appearing but in no acute distress.  Neuro exam is nonfocal although the patient does have some twitching of the facial muscles in the left arm.  I do not appreciate any facial droop or other focal deficits.  The remainder of the exam is as described above.  Based on the patient's fever and other vital signs, presentation is consistent with sepsis although from his symptoms the source of infection is unclear.  Differential includes pneumonia, viral bronchitis, influenza, UTI.  I do not see any evidence of skin or soft tissue infection.  We will give fluids, obtain labs and sepsis work-up, and reassess.  Anticipate admission.  The cause of the new twitching movement is unclear but may be related to electrolyte abnormalities or other systemic etiology.  It does not appear to be seizure activity and the patient is fully alert.  There is no evidence of acute stroke based on his current exam.  I will obtain a CT head to rule out acute etiologies.  ----------------------------------------- 6:45 PM on 09/21/2018 -----------------------------------------  So far from the work-up the etiology of the patient's sepsis is not evident, however I ordered  broad-spectrum antibiotics.  His fever has improved.  He appears more comfortable.  I also started calcium gluconate since his potassium is elevated although ultimately he will need dialysis.  He has no EKG changes reflecting hyperkalemia.  CT head shows no acute findings.  Patient is stable for admission at this time.  I signed him out to the hospitalist Dr. Jerelyn Charles. ____________________________________________   FINAL CLINICAL IMPRESSION(S) / ED DIAGNOSES  Final diagnoses:  Sepsis, due to unspecified organism, unspecified whether acute organ dysfunction present [facility])      NEW MEDICATIONS STARTED DURING THIS VISIT:  New Prescriptions   No medications on file     Note:  This document was prepared using Dragon voice recognition software and may include unintentional dictation errors.    Arta Silence, MD 09/21/18 [phone]

## 2018-09-21 NOTE — ED Triage Notes (Signed)
Pt arrives via [facility] from [facility], pt started c/o chest pain at 1100 this am and was given tylenol, pt cont to have pain and started jerking so staff called 911, pt had a fever of 102.7 per ems, pt was given another dose of tylenol at 1400 of 650mg . Pt had a recent bka on the right side and pt also was diagnosed with MI early dec

## 2018-09-21 NOTE — H&P (Signed)
[facility] NAME: [name]    MR#:  [id number]  DATE OF BIRTH:  05-12-1961  DATE OF ADMISSION:  09/21/2018  PRIMARY CARE PHYSICIAN: Alene Mires Elyse Jarvis, MD   REQUESTING/REFERRING PHYSICIAN:   CHIEF COMPLAINT:   Chief Complaint  Patient presents with  . Chest Pain  . Fever    HISTORY OF PRESENT ILLNESS: [name]  is a 57 y.o. male with a known history per below, recent hospital discharge 6 days ago for A. 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, returns from nursing home setting with fevers of 102.7, tremors, chest pain, jerking of his face and left arm that started around 11 AM this morning, in the emergency room patient was found to be febrile, tachycardic, hypotensive, white count 28,000, sepsis protocol initiated, started on empiric Vanco/cefepime, potassium 6.3, creatinine 8, glucose 254, lactic acid 2.3, CT head negative, chest x-ray noted for cardiomegaly/stable pulmonary venous congestion, patient evaluated in the emergency room, no apparent distress, wife and multiple other family members at the bedside, patient now being admitted secondary to acute sepsis secondary to unknown etiology.  PAST MEDICAL HISTORY:   Past Medical History:  Diagnosis Date  . Anemia of chronic disease   . Atherosclerosis   . Atrial fibrillation with rapid ventricular response (HCC)    chadsvasc at least 6; new onset versus paroxysmal with first or most recent episode on 09/10/18  . CAD (coronary artery disease)    2017 LHC, severe 1v disease, occluded pRCA, L-R collaterals, moderate LV dysfunction EF 30-35  . Diabetes mellitus with complication ([place])   . End stage renal disease on dialysis ([place])    Labile BP  . History of ventricular tachycardia    2017 polymorphic VT and cardiac arrest s/p angioplasty   . Hypercholesteremia   . Hyperlipidemia LDL goal <70   . Hypertension   . Labile blood pressure    on  HD  . NSVT (nonsustained ventricular tachycardia) ([place])    08/2018 NSVT  . Peripheral arterial occlusive disease (HCC)    s/p L BKA, R AKA  . S/P AKA (above knee amputation) unilateral, right ([place])    08/2018  . S/P BKA (below knee amputation) unilateral, left ([place])    2015  . Stroke ([place])   . Systolic heart failure ([place])    2017 EF per LHC 30-35%    PAST SURGICAL HISTORY:  Past Surgical History:  Procedure Laterality Date  . A/V FISTULAGRAM Left 01/09/2017   Procedure: A/V Fistulagram;  Surgeon: [name], MD;  Location: [facility];  Service: Cardiovascular;  Laterality: Left;  . A/V FISTULAGRAM Left 06/06/2017   Procedure: A/V Fistulagram;  Surgeon: [name], MD;  Location: [facility];  Service: Cardiovascular;  Laterality: Left;  . A/V FISTULAGRAM Left 08/15/2017   Procedure: A/V Fistulagram;  Surgeon: [name], MD;  Location: [facility];  Service: Cardiovascular;  Laterality: Left;  . A/V FISTULAGRAM Left 01/22/2018   Procedure: A/V FISTULAGRAM;  Surgeon: [name], MD;  Location: [facility];  Service: Cardiovascular;  Laterality: Left;  . A/V SHUNT INTERVENTION N/A 06/06/2017   Procedure: A/V SHUNT INTERVENTION;  Surgeon: [name], MD;  Location: [facility];  Service: Cardiovascular;  Laterality: N/A;  . AMPUTATION Right 04/25/2016   Procedure: TOE AMPUTATION;  Surgeon: Katha Cabal, MD;  Location: [facility];  Service: Vascular;  Laterality: Right;  . AMPUTATION Right 09/09/2018   Procedure: AMPUTATION ABOVE KNEE;  Surgeon: [name], MD;  Location: [facility];  Service: Vascular;  Laterality: Right;  . APPLICATION OF WOUND VAC Right 04/25/2016   Procedure: APPLICATION OF WOUND VAC;  Surgeon: Katha Cabal, MD;  Location: [facility];  Service: Vascular;  Laterality: Right;  . AV FISTULA PLACEMENT Left 2015  . CARDIAC CATHETERIZATION N/A 04/30/2016   Procedure: Left Heart Cath and Coronary Angiography;  Surgeon:  Wellington Hampshire, MD;  Location: [facility];  Service: Cardiovascular;  Laterality: N/A;  . CATARACT EXTRACTION, BILATERAL    . LEG AMPUTATION BELOW KNEE Left   . LOWER EXTREMITY ANGIOGRAPHY Right 07/24/2018   Procedure: LOWER EXTREMITY ANGIOGRAPHY;  Surgeon: [name], MD;  Location: [facility];  Service: Cardiovascular;  Laterality: Right;  . LOWER EXTREMITY ANGIOGRAPHY Right 08/13/2018   Procedure: LOWER EXTREMITY ANGIOGRAPHY;  Surgeon: [name], MD;  Location: [facility];  Service: Cardiovascular;  Laterality: Right;  . LOWER EXTREMITY ANGIOGRAPHY Right 09/04/2018   Procedure: Lower Extremity Angiography;  Surgeon: [name], MD;  Location: [facility];  Service: Cardiovascular;  Laterality: Right;  . PERIPHERAL VASCULAR CATHETERIZATION N/A 10/03/2015   Procedure: A/V Shuntogram/Fistulagram;  Surgeon: [name], MD;  Location: [facility];  Service: Cardiovascular;  Laterality: N/A;  . PERIPHERAL VASCULAR CATHETERIZATION N/A 01/16/2016   Procedure: Abdominal Aortogram w/Lower Extremity;  Surgeon: [name], MD;  Location: [facility];  Service: Cardiovascular;  Laterality: N/A;  . PERIPHERAL VASCULAR CATHETERIZATION  01/16/2016   Procedure: Lower Extremity Intervention;  Surgeon: [name], MD;  Location: [facility];  Service: Cardiovascular;;  . PERIPHERAL VASCULAR CATHETERIZATION Right 04/27/2016   Procedure: Lower Extremity Angiography;  Surgeon: Katha Cabal, MD;  Location: [facility];  Service: Cardiovascular;  Laterality: Right;  . PERIPHERAL VASCULAR CATHETERIZATION  04/27/2016   Procedure: Lower Extremity Intervention;  Surgeon: Katha Cabal, MD;  Location: [facility];  Service: Cardiovascular;;  . TRANSMETATARSAL AMPUTATION Right 04/28/2016   Procedure: TRANSMETATARSAL AMPUTATION;  Surgeon: Albertine Patricia, DPM;  Location: [facility];  Service: Podiatry;  Laterality: Right;  .  WOUND DEBRIDEMENT Right 04/25/2016   Procedure: DEBRIDEMENT WOUND;  Surgeon: Katha Cabal, MD;  Location: [facility];  Service: Vascular;  Laterality: Right;    SOCIAL HISTORY:  Social History   Tobacco Use  . Smoking status: Never Smoker  . Smokeless tobacco: Never Used  Substance Use Topics  . Alcohol use: No    FAMILY HISTORY:  Family History  Problem Relation Age of Onset  . Diabetes Father   . Prostate cancer Father     DRUG ALLERGIES: No Known Allergies  REVIEW OF SYSTEMS:   CONSTITUTIONAL: + fever, fatigue, weakness.  EYES: No blurred or double vision.  EARS, NOSE, AND THROAT: No tinnitus or ear pain.  RESPIRATORY: No cough, shortness of breath, wheezing or hemoptysis.  CARDIOVASCULAR: No chest pain, orthopnea, edema.  GASTROINTESTINAL: No nausea, vomiting, diarrhea or abdominal pain.  GENITOURINARY: No dysuria, hematuria.  ENDOCRINE: No polyuria, nocturia,  HEMATOLOGY: No anemia, easy bruising or bleeding SKIN: No rash or lesion. MUSCULOSKELETAL: No joint pain or arthritis.   NEUROLOGIC: No tingling, numbness, weakness.  Shakiness, jerking of face and left arm PSYCHIATRY: No anxiety or depression.   MEDICATIONS AT HOME:  Prior to Admission medications   Medication Sig Start Date End Date Taking?  Authorizing Provider  acetaminophen (TYLENOL) 325 MG tablet Take 650 mg by mouth every 6 (six) hours as needed.   Yes [provider]  Amino Acids-Protein Hydrolys (FEEDING SUPPLEMENT, PRO-STAT SUGAR FREE 64,) LIQD Take 30 mLs by mouth daily.   Yes [provider]  amiodarone (PACERONE) 200 MG tablet Take 1 tablet (200 mg total) by mouth daily. 09/15/18  Yes Sudini, Alveta Heimlich, MD  apixaban (ELIQUIS) 5 MG TABS tablet Take 1 tablet (5 mg total) by mouth 2 (two) times daily. 09/15/18  Yes Hillary Bow, MD  aspirin EC 81 MG tablet Take 81 mg by mouth daily.   Yes [provider]  b complex-vitamin c-folic acid (NEPHRO-VITE) 0.8 MG TABS tablet Take 1  tablet by mouth daily.   Yes [provider]  cinacalcet (SENSIPAR) 60 MG tablet Take 60 mg by mouth daily.    Yes [provider]  ferrous sulfate 325 (65 FE) MG EC tablet Take 325 mg by mouth 3 (three) times daily with meals.   Yes [provider]  gabapentin (NEURONTIN) 300 MG capsule Take 1 capsule (300 mg total) by mouth 3 (three) times daily. 09/18/18  Yes Kris Hartmann, NP  glipiZIDE (GLUCOTROL) 10 MG tablet Take 10 mg by mouth daily.  10/25/16  Yes [provider]  lidocaine-prilocaine (EMLA) cream Apply 1 application topically as needed (port access).   Yes [provider]  metoprolol tartrate (LOPRESSOR) 25 MG tablet Take 1 tablet (25 mg total) by mouth 2 (two) times daily. 09/15/18  Yes Sudini, Alveta Heimlich, MD  midodrine (PROAMATINE) 5 MG tablet Take 1 tablet (5 mg total) by mouth 3 (three) times daily with meals. 09/15/18  Yes Sudini, Alveta Heimlich, MD  omeprazole (PRILOSEC) 40 MG capsule Take 40 mg by mouth 2 (two) times daily.    Yes [provider]  oxyCODONE (OXY IR/ROXICODONE) 5 MG immediate release tablet Take 1 tablet (5 mg total) by mouth every 6 (six) hours as needed for moderate pain or severe pain. 09/15/18  Yes Sudini, Alveta Heimlich, MD  polyethylene glycol (MIRALAX / GLYCOLAX) packet Take 17 g by mouth 2 (two) times daily.    Yes [provider]  senna-docusate (SENOKOT-S) 8.6-50 MG tablet Take 1 tablet by mouth 2 (two) times daily.   Yes [provider]  sevelamer carbonate (RENVELA) 800 MG tablet Take 2,400 mg by mouth 3 (three) times daily with meals.    Yes [provider]      PHYSICAL EXAMINATION:   VITAL SIGNS: Blood pressure (!) 90/37, pulse 74, temperature 100 F (37.8 C), temperature source Oral, resp. rate 16, height 5\' 10"  (1.778 m), weight 102.1 kg, SpO2 97 %.  GENERAL:  57 y.o.-year-old patient lying in the bed with no acute distress.  EYES: Pupils equal, round, reactive to light and accommodation.  No scleral icterus. Extraocular muscles intact.  HEENT: Head atraumatic, normocephalic. Oropharynx and nasopharynx clear.  NECK:  Supple, no jugular venous distention. No thyroid enlargement, no tenderness.  LUNGS: Normal breath sounds bilaterally, no wheezing, rales,rhonchi or crepitation. No use of accessory muscles of respiration.  CARDIOVASCULAR: S1, S2 normal. No murmurs, rubs, or gallops.  ABDOMEN: Soft, nontender, nondistended. Bowel sounds present. No organomegaly or mass.  EXTREMITIES: No pedal edema, cyanosis, or clubbing.  NEUROLOGIC: Cranial nerves II through XII are intact. Muscle strength 5/5 in all extremities. Sensation intact. Gait not checked.  PSYCHIATRIC: The patient is alert and oriented x 3.  SKIN: No obvious rash, lesion, or ulcer.   LABORATORY  PANEL:   CBC Recent Labs  Lab 09/21/18 1531  WBC 28.1*  HGB 8.0*  HCT 26.8*  PLT 576*  MCV 98.5  MCH 29.4  MCHC 29.9*  RDW 16.1*   ------------------------------------------------------------------------------------------------------------------  Chemistries  Recent Labs  Lab 09/21/18 1531  NA 132*  K 6.3*  CL 93*  CO2 27  GLUCOSE 254*  BUN 61*  CREATININE 8.07*  CALCIUM 8.5*   ------------------------------------------------------------------------------------------------------------------ estimated creatinine clearance is 11.8 mL/min (A) (by C-G formula based on SCr of 8.07 mg/dL (H)). ------------------------------------------------------------------------------------------------------------------ No results for input(s): TSH, T4TOTAL, T3FREE, THYROIDAB in the last 72 hours.  Invalid input(s): FREET3   Coagulation profile No results for input(s): INR, PROTIME in the last 168 hours. ------------------------------------------------------------------------------------------------------------------- No results for input(s): DDIMER in the last 72  hours. -------------------------------------------------------------------------------------------------------------------  Cardiac Enzymes Recent Labs  Lab 09/21/18 1531  TROPONINI 0.06*   ------------------------------------------------------------------------------------------------------------------ Invalid input(s): POCBNP  ---------------------------------------------------------------------------------------------------------------  Urinalysis No results found for: COLORURINE, APPEARANCEUR, LABSPEC, PHURINE, GLUCOSEU, HGBUR, BILIRUBINUR, KETONESUR, PROTEINUR, UROBILINOGEN, NITRITE, LEUKOCYTESUR   RADIOLOGY: Dg Chest 2 View  Result Date: 09/21/2018 CLINICAL DATA:  Chest pain. EXAM: CHEST - 2 VIEW COMPARISON:  05/02/2016 FINDINGS: Lateral views degraded by motion and patient size. Midline trachea. Moderate cardiomegaly. Atherosclerosis in the transverse aorta. No pleural effusion or pneumothorax. No overt congestive failure. Chronic interstitial thickening may represent mild pulmonary venous congestion. No lobar consolidation. IMPRESSION: Cardiomegaly with similar pulmonary venous congestion. Aortic Atherosclerosis (ICD10-I70.0). Electronically Signed   By: Abigail Miyamoto M.D.   On: 09/21/2018 16:08   Ct Head Wo Contrast  Result Date: 09/21/2018 CLINICAL DATA:  Pt arrives via ems from [facility], pt started c/o chest pain at 1100 this am and was given tylenol, pt cont to have pain and started jerking so staff called 911, pt had a fever of 102.7 per ems, pt was given another dose of tylenol at 1400 of 650mg . 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 COMPARISON:  04/28/2016 FINDINGS: Brain: No evidence of acute infarction, hemorrhage, hydrocephalus, extra-axial collection or mass lesion/mass  effect. There is ventricular and sulcal enlargement reflecting mild generalized atrophy, advanced for age. Patchy periventricular white matter hypoattenuation is also noted consistent with mild chronic microvascular ischemic change. Vascular: No hyperdense vessel or unexpected calcification. Skull: Normal. Negative for fracture or focal lesion. Sinuses/Orbits: Visualized globes and orbits are unremarkable. The visualized sinuses and mastoid air cells are clear. Other: None. IMPRESSION: 1. No acute intracranial abnormalities. 2. Atrophy advanced for age. Mild chronic microvascular ischemic change. Electronically Signed   By: Lajean Manes M.D.   On: 09/21/2018 16:57    EKG: Orders placed or performed during the hospital encounter of 09/21/18  . ED EKG within 10 minutes  . ED EKG within 10 minutes    IMPRESSION AND PLAN: 57 year old male with end-stage renal disease on dialysis presented with acute sepsis from nursing home setting  *Acute sepsis secondary to unknown etiology Continue sepsis protocol, empiric vancomycin/cefepime, IV fluids rehydration, follow-up on cultures  *Acute hyperkalemia with chronic ESRD Veltassa, Kayexalate, IV calcium gluconate, sodium bicarb, nephrology consulted for hemodialysis needs  *Chronic end-stage renal disease Nephrology consulted for hemodialysis needs  *Chronic A. fib  Beta-blocker therapy held for hypotension, continue Eliquis, amiodarone  *Recent right BKA due to Ischemia of the right leg with gangrene Stable Continue  local wound care  * Chronic systolic and diastolic heart failure without exacerbation Stable Beta-blocker therapy on hold for relative hypotension, continue aspirin, strict I&O monitoring, daily weights  *History of CVA  Stable  On Eliquis, continue aspirin   *Coronary artery disease Continue aspirin, on Eliquis   All the records are reviewed and case discussed with ED provider. Management plans discussed with the patient,  family and they are in agreement.  CODE STATUS:full Code Status History    Date Active Date Inactive Code Status Order ID Comments User Context   09/03/2018 2041 09/15/2018 2255 Full Code [id number]  Vaughan Basta, MD ED   08/12/2018 1629 08/14/2018 1324 Full Code [id number]  [name], MD ED   07/24/2018 1246 07/24/2018 1759 Full Code [id number]  [name], MD Inpatient   01/09/2017 1135 01/09/2017 1531 Full Code [id number]  [name], MD Inpatient   04/28/2016 1034 05/02/2016 2139 Full Code [id number]  Flora Lipps, MD Inpatient   04/25/2016 1340 04/28/2016 1034 Full Code [id number]  Schnier, Dolores Lory, MD Inpatient    Advance Directive Documentation     Most Recent Value  Type of Advance Directive  Out of facility DNR (pink MOST or yellow form)  Pre-existing out of facility DNR order (yellow form or pink MOST form)  Pink MOST form placed in chart (order not valid for inpatient use)  "MOST" Form in Place?  -       TOTAL TIME TAKING CARE OF THIS PATIENT: 40 minutes.    Avel Peace Chaunce Winkels M.D on 09/21/2018   Between 7am to 6pm - Pager - [phone]  After 6pm go to [email or web address] - password [email or web address] [facility]  Office  [phone]  CC: Primary care physician; [name], MD   Note: This dictation was prepared with Dragon dictation along with smaller phrase technology. Any transcriptional errors that result from this process are unintentional.

## 2018-09-21 NOTE — ED Notes (Signed)
Patient taken to imaging.  Dr.Siadecki aware of Troponin of 0.06 and potassium of 6.3.

## 2018-09-21 NOTE — Consult Note (Addendum)
Pharmacy Antibiotic Note  [name] is a 57 y.o. male admitted on 09/21/2018 with acute sepsis secondary to unknown etiology.   Pharmacy has been consulted for Vancomycin/Cefepime dosing.  Plan:  Patient has ESRD and is on hemodialysis.  Patient received Cefepime 2g IV in ED 12/8 @ 1714 Patient received Vancomycin 1g IV in ED 12/8 @ 1803  Patient will receive an additional dose of Vancomycin to finish his loading dose of 1000mg  for a total loading dose of 2000mg .    Patient will receive 1g Vancomycin IV after each dialyses session and  1g q 24h Cefepime IV (after dialyses session on dialysis days)  Pt will have a random vanc drawn prior to HD to assess therapeutic level.  Target pre-HD level is 15-25 mcg/ml    Height: 5\' 10"  (177.8 cm) Weight: 225 lb (102.1 kg) IBW/kg (Calculated) : 73  Temp (24hrs), Avg:100 F (37.8 C), Min:99.2 F (37.3 C), Max:100.9 F (38.3 C)  Recent Labs  Lab 09/21/18 1531 09/21/18 1546 09/21/18 1903  WBC 28.1*  --   --   CREATININE 8.07*  --   --   LATICACIDVEN  --  2.30* 0.65    Estimated Creatinine Clearance: 11.8 mL/min (A) (by C-G formula based on SCr of 8.07 mg/dL (H)).    No Known Allergies  Antimicrobials this admission: Vancomycin 12/8 >>    Cefepime 12/8 >>   Dose adjustments this admission: None at this time  Microbiology results: 12/8 BCx: pending   Thank you for allowing pharmacy to be a part of this patient's care.  Lu Duffel, PharmD, BCPS Clinical Pharmacist 09/21/2018 7:32 PM

## 2018-09-21 NOTE — ED Notes (Addendum)
Pharmacist Juanda Crumble was notified and verified that vanc and calcium gluconate are compatible. IVs are positional which is slower down infusions.

## 2018-09-21 NOTE — Progress Notes (Signed)
Family Meeting Note  Advance Directive:yes  Today a meeting took place with the Patient wife  Patient is able to participate   The following clinical team members were present during this meeting:MD  The following were discussed:Patient's diagnosis: esrd, sepsis, Patient's progosis: Unable to determine and Goals for treatment: Full Code  Additional follow-up to be provided: prn  Time spent during discussion:20 minutes  Gorden Harms, MD

## 2018-09-22 LAB — RENAL FUNCTION PANEL
ANION GAP: 12 (ref 5–15)
Albumin: 2.8 g/dL — ABNORMAL LOW (ref 3.5–5.0)
BUN: 78 mg/dL — ABNORMAL HIGH (ref 6–20)
CO2: 25 mmol/L (ref 22–32)
Calcium: 8.2 mg/dL — ABNORMAL LOW (ref 8.9–10.3)
Chloride: 95 mmol/L — ABNORMAL LOW (ref 98–111)
Creatinine, Ser: 8.93 mg/dL — ABNORMAL HIGH (ref 0.61–1.24)
GFR calc Af Amer: 7 mL/min — ABNORMAL LOW (ref >60)
GFR calc non Af Amer: 6 mL/min — ABNORMAL LOW (ref >60)
Glucose, Bld: 158 mg/dL — ABNORMAL HIGH (ref 70–99)
Phosphorus: 4.2 mg/dL (ref 2.5–4.6)
Potassium: 5.9 mmol/L — ABNORMAL HIGH (ref 3.5–5.1)
Sodium: 132 mmol/L — ABNORMAL LOW (ref 135–145)

## 2018-09-22 LAB — TROPONIN I
TROPONIN I: 0.06 ng/mL — AB (ref <0.03)
Troponin I: 0.18 ng/mL (ref <0.03)

## 2018-09-22 LAB — GLUCOSE, CAPILLARY
Glucose-Capillary: 130 mg/dL — ABNORMAL HIGH (ref 70–99)
Glucose-Capillary: 150 mg/dL — ABNORMAL HIGH (ref 70–99)
Glucose-Capillary: 207 mg/dL — ABNORMAL HIGH (ref 70–99)
Glucose-Capillary: 220 mg/dL — ABNORMAL HIGH (ref 70–99)

## 2018-09-22 LAB — CBC
HCT: 23.3 % — ABNORMAL LOW (ref 39.0–52.0)
Hemoglobin: 6.8 g/dL — ABNORMAL LOW (ref 13.0–17.0)
MCH: 29.2 pg (ref 26.0–34.0)
MCHC: 29.2 g/dL — ABNORMAL LOW (ref 30.0–36.0)
MCV: 100 fL (ref 80.0–100.0)
NRBC: 0 % (ref 0.0–0.2)
Platelets: 419 10*3/uL — ABNORMAL HIGH (ref 150–400)
RBC: 2.33 MIL/uL — ABNORMAL LOW (ref 4.22–5.81)
RDW: 16.3 % — ABNORMAL HIGH (ref 11.5–15.5)
WBC: 19.4 10*3/uL — ABNORMAL HIGH (ref 4.0–10.5)

## 2018-09-22 LAB — LACTIC ACID, PLASMA: Lactic Acid, Venous: 1.9 mmol/L (ref 0.5–1.9)

## 2018-09-22 LAB — HEMOGLOBIN AND HEMATOCRIT, BLOOD
HCT: 24.5 % — ABNORMAL LOW (ref 39.0–52.0)
Hemoglobin: 7.3 g/dL — ABNORMAL LOW (ref 13.0–17.0)

## 2018-09-22 LAB — MRSA PCR SCREENING: MRSA by PCR: NEGATIVE

## 2018-09-22 LAB — PREPARE RBC (CROSSMATCH)

## 2018-09-22 LAB — VANCOMYCIN, RANDOM: Vancomycin Rm: 21

## 2018-09-22 MED ORDER — CIPROFLOXACIN-DEXAMETHASONE 0.3-0.1 % OT SUSP
4.0000 [drp] | Freq: Two times a day (BID) | OTIC | Status: DC
  Administered 2018-09-22 – 2018-09-28 (×12): 4 [drp] via OTIC
  Filled 2018-09-22: qty 7.5

## 2018-09-22 MED ORDER — FUROSEMIDE 10 MG/ML IJ SOLN
20.0000 mg | Freq: Once | INTRAMUSCULAR | Status: AC
  Administered 2018-09-22: 20 mg via INTRAVENOUS
  Filled 2018-09-22: qty 4
  Filled 2018-09-22: qty 2

## 2018-09-22 MED ORDER — MIDODRINE HCL 5 MG PO TABS
10.0000 mg | ORAL_TABLET | Freq: Four times a day (QID) | ORAL | Status: DC | PRN
  Administered 2018-09-22 – 2018-09-28 (×3): 10 mg via ORAL
  Filled 2018-09-22 (×4): qty 2

## 2018-09-22 MED ORDER — EPOETIN ALFA 10000 UNIT/ML IJ SOLN
10000.0000 [IU] | INTRAMUSCULAR | Status: DC
  Administered 2018-09-22: 10000 [IU] via INTRAVENOUS

## 2018-09-22 MED ORDER — SODIUM CHLORIDE 0.9% IV SOLUTION
Freq: Once | INTRAVENOUS | Status: AC
  Administered 2018-09-22: 18:00:00 via INTRAVENOUS

## 2018-09-22 NOTE — Progress Notes (Signed)
[name] at [place]: [name]    MR#:  [id number]  DATE OF BIRTH:  [birth year]  SUBJECTIVE:   Patient presented with fever.  Patient seen during dialysis. Patient reports right ear hurts REVIEW OF SYSTEMS:    Review of Systems  Constitutional: Negative for fever, chills weight loss HENT: Negative for ear pain, nosebleeds, congestion, facial swelling, rhinorrhea, neck pain, neck stiffness and ear discharge.   Respiratory: Negative for cough, shortness of breath, wheezing  Cardiovascular: Negative for chest pain, palpitations and leg swelling.  Gastrointestinal: Negative for heartburn, abdominal pain, vomiting, diarrhea or consitpation Genitourinary: Negative for dysuria, urgency, frequency, hematuria Musculoskeletal: Negative for back pain or joint pain Neurological: Negative for dizziness, seizures, syncope, focal weakness,  numbness and headaches.  Hematological: Does not bruise/bleed easily.  Psychiatric/Behavioral: Negative for hallucinations, confusion, dysphoric mood    Tolerating Diet: yes      DRUG ALLERGIES:  No Known Allergies  VITALS:  Blood pressure (!) 98/47, pulse 66, temperature 99.6 F (37.6 C), temperature source Oral, resp. rate 13, height 5\' 10"  (1.778 m), weight 115.8 kg, SpO2 98 %.  PHYSICAL EXAMINATION:  Constitutional: Appears well-developed and well-nourished. No distress. HENT: Normocephalic. Marland Kitchen Oropharynx is clear and moist.  Eyes: Conjunctivae and EOM are normal. PERRLA, no scleral icterus.  Neck: Normal ROM. Neck supple. No JVD. No tracheal deviation. CVS: RRR, S1/S2 +, no murmurs, no gallops, no carotid bruit.  Pulmonary: Effort and breath sounds normal, no stridor, rhonchi, wheezes, rales.  Abdominal: Soft. BS +,  no distension, tenderness, rebound or guarding.  Musculoskeletal: Lateral AKA   neuro: Alert. CN 2-12 grossly intact. No focal deficits. Skin: Skin is warm and dry. No rash  noted. Psychiatric: Normal mood and affect.      LABORATORY PANEL:   CBC Recent Labs  Lab 09/22/18 0838  WBC 19.4*  HGB 6.8*  HCT 23.3*  PLT 419*   ------------------------------------------------------------------------------------------------------------------  Chemistries  Recent Labs  Lab 09/22/18 0838  NA 132*  K 5.9*  CL 95*  CO2 25  GLUCOSE 158*  BUN 78*  CREATININE 8.93*  CALCIUM 8.2*   ------------------------------------------------------------------------------------------------------------------  Cardiac Enzymes Recent Labs  Lab 09/21/18 1531  TROPONINI 0.06*   ------------------------------------------------------------------------------------------------------------------  RADIOLOGY:  Dg Chest 2 View  Result Date: 09/21/2018 CLINICAL DATA:  Chest pain. EXAM: CHEST - 2 VIEW COMPARISON:  05/02/2016 FINDINGS: Lateral views degraded by motion and patient size. Midline trachea. Moderate cardiomegaly. Atherosclerosis in the transverse aorta. No pleural effusion or pneumothorax. No overt congestive failure. Chronic interstitial thickening may represent mild pulmonary venous congestion. No lobar consolidation. IMPRESSION: Cardiomegaly with similar pulmonary venous congestion. Aortic Atherosclerosis (ICD10-I70.0). Electronically Signed   By: Abigail Miyamoto M.D.   On: 09/21/2018 16:08   Ct Head Wo Contrast  Result Date: 09/21/2018 CLINICAL DATA:  Pt arrives via ems from [facility], pt started c/o chest pain at 1100 this am and was given tylenol, pt cont to have pain and started jerking so staff called 911, pt had a fever of 102.7 per ems, pt was given another dose of tylenol at 1400 of 650mg . 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 COMPARISON:  04/28/2016 FINDINGS:  Brain: No evidence of acute infarction, hemorrhage, hydrocephalus, extra-axial collection or mass lesion/mass effect. There is ventricular  and sulcal enlargement reflecting mild generalized atrophy, advanced for age. Patchy periventricular white matter hypoattenuation is also noted consistent with mild chronic microvascular ischemic change. Vascular: No hyperdense vessel or unexpected calcification. Skull: Normal. Negative for fracture or focal lesion. Sinuses/Orbits: Visualized globes and orbits are unremarkable. The visualized sinuses and mastoid air cells are clear. Other: None. IMPRESSION: 1. No acute intracranial abnormalities. 2. Atrophy advanced for age. Mild chronic microvascular ischemic change. Electronically Signed   By: Lajean Manes M.D.   On: 09/21/2018 16:57     ASSESSMENT AND PLAN:   57 year old male with end-stage renal disease on hemodialysis, peripheral vascular disease with recent AKA, diabetes  who presented to the ER due to fever.  1.  Sepsis: Patient presents with fever and leukocytosis. Sepsis from unclear etiology with negative chest x-ray. Right ear is painful and patient may have otitis media Follow-up on blood culture Vascular surgery consultation to evaluate stump, however on my gross inspection it does not look infected. ID consultation for further evaluation and recommendations  2.  Acute on chronic anemia: Transfuse 1 unit PRBC Patient has consented. Epogen during dialysis Ferrous sulfate  3.  History of hypotension Continue midodrine before dialysis treatments  4.  Peripheral artery disease status post recent right above-knee amputation: Vascular surgery consultation placed via epic  5.  Diabetes: Continue current care with ADA diet  6.  PAF: Continue Eliquis and amiodarone Patient currently in normal sinus rhythm  7.  End-stage renal disease on hemodialysis: Continue dialysis as per schedule by nephrology  8.  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    Management plans discussed with the patient and he is in agreement.  CODE STATUS: Full  TOTAL TIME TAKING CARE OF THIS PATIENT: 59 minutes.     POSSIBLE D/C 2 days, DEPENDING ON CLINICAL CONDITION.   Kaelen Caughlin M.D on 09/22/2018 at 12:35 PM  Between 7am to 6pm - Pager - [phone] After 6pm go to [email or web address] - password [email or web address]  Office  [phone]  CC: Primary care physician; [name], MD  Note: This dictation was prepared with Dragon dictation along with smaller phrase technology. Any transcriptional errors that result from this process are unintentional.

## 2018-09-22 NOTE — Progress Notes (Signed)
Patient arrived from dialysis earlier and was having a-fib and heart rate in the 110-130's.  Patient having chest pain and pain in his legs.  Dr Benjie Karvonen notified.  Troponins and EKG ordered.  Patient received amiodorone as requested by MD.  Troponin just came back and is 0.06.  Dr mody notified.  No new orders at this time

## 2018-09-22 NOTE — Progress Notes (Signed)
PT Cancellation Note  Patient Details Name: [name] MRN: [id number] DOB: 06/24/1961   Cancelled Treatment:    Reason Eval/Treat Not Completed: Patient at procedure or test/unavailable(currently off floor for dialysis).  Will continue to follow pt acutely.   Collie Siad PT, DPT 09/22/2018, 12:52 PM

## 2018-09-22 NOTE — Progress Notes (Signed)
HD tx end    09/22/18 1350  Vital Signs  Pulse Rate 71  Pulse Rate Source Monitor  Resp 17  BP 91/68  BP Location Right Arm  BP Method Automatic  Patient Position (if appropriate) Lying  Oxygen Therapy  SpO2 100 %  O2 Device Room Air  During Hemodialysis Assessment  Dialysis Fluid Bolus Normal Saline  Bolus Amount (mL) 250 mL  Intra-Hemodialysis Comments Tx completed

## 2018-09-22 NOTE — Progress Notes (Signed)
Pre HD assessment    09/22/18 0944  Neurological  Level of Consciousness Alert  Orientation Level Oriented X4  Respiratory  Respiratory Pattern Regular;Unlabored  Chest Assessment Chest expansion symmetrical  Cardiac  ECG Monitor Yes  Cardiac Rhythm NSR  Vascular  R Radial Pulse +2  L Radial Pulse +2  Edema Generalized  Integumentary  Integumentary (WDL) X  Skin Color Appropriate for ethnicity  Musculoskeletal  Musculoskeletal (WDL) X  Generalized Weakness Yes  Assistive Device None  GU Assessment  Genitourinary (WDL) X  Genitourinary Symptoms  (HD)  Psychosocial  Psychosocial (WDL) WDL

## 2018-09-22 NOTE — Progress Notes (Signed)
Patient's troponin came back 0.18. I paged hospitalist on call and Dr Duane Boston returned my page. She was not concerned with recent lab value of troponin. I also told her about Hgb that had improved slightly. She said just to wait and see how morning labs came back. I asked her about wife's concern of patient twitching type behavior in arms and left face.  The wife stated she told the daytime doctor about this symptoms. Dr Duane Boston said this is typical with his kidney function history. I told her we would keep an eye on him tonight.

## 2018-09-22 NOTE — Progress Notes (Signed)
Patient converted back to sinus rhythm, heart rate in 70's

## 2018-09-22 NOTE — Progress Notes (Signed)
Post HD    09/22/18 1359  Cardiac  Cardiac Rhythm Atrial fibrillation;ST

## 2018-09-22 NOTE — Progress Notes (Signed)
Patient transported via bed to dialysis.

## 2018-09-22 NOTE — Consult Note (Signed)
Infectious Disease     Reason for Consult: Fever,recent BKA, ESRD  Referring Physician: Bettey Costa Date of Admission:  09/21/2018   Active Problems:   Sepsis ([place])   HPI: [name] is a 57 y.o. male with hx ESRD, recent admission for limb ischemia, s/p BKA on 11/26, now admitted from NH with fevers chills. He has also been complaining of R ear pain.  He has been having some chest pain but minimal cough, Denies pain at the LUE HD site, minimal pain at R BKA stump and denies any drainage or redness there. No headaches. Some diarrhea but no abd pain, nv.   Past Medical History:  Diagnosis Date  . Anemia of chronic disease   . Atherosclerosis   . Atrial fibrillation with rapid ventricular response (HCC)    chadsvasc at least 6; new onset versus paroxysmal with first or most recent episode on 09/10/18  . CAD (coronary artery disease)    2017 LHC, severe 1v disease, occluded pRCA, L-R collaterals, moderate LV dysfunction EF 30-35  . Diabetes mellitus with complication ([place])   . End stage renal disease on dialysis ([place])    Labile BP  . History of ventricular tachycardia    2017 polymorphic VT and cardiac arrest s/p angioplasty   . Hypercholesteremia   . Hyperlipidemia LDL goal <70   . Hypertension   . Labile blood pressure    on HD  . NSVT (nonsustained ventricular tachycardia) ([place])    08/2018 NSVT  . Peripheral arterial occlusive disease (HCC)    s/p L BKA, R AKA  . S/P AKA (above knee amputation) unilateral, right ([place])    08/2018  . S/P BKA (below knee amputation) unilateral, left ([place])    2015  . Stroke ([place])   . Systolic heart failure ([place])    2017 EF per LHC 30-35%   Past Surgical History:  Procedure Laterality Date  . A/V FISTULAGRAM Left 01/09/2017   Procedure: A/V Fistulagram;  Surgeon: [name], MD;  Location: [facility];  Service: Cardiovascular;  Laterality: Left;  . A/V FISTULAGRAM Left 06/06/2017   Procedure: A/V Fistulagram;  Surgeon: [name], MD;  Location: [facility];  Service: Cardiovascular;  Laterality: Left;  . A/V FISTULAGRAM Left 08/15/2017   Procedure: A/V Fistulagram;  Surgeon: [name], MD;  Location: [facility];  Service: Cardiovascular;  Laterality: Left;  . A/V FISTULAGRAM Left 01/22/2018   Procedure: A/V FISTULAGRAM;  Surgeon: [name], MD;  Location: [facility];  Service: Cardiovascular;  Laterality: Left;  . A/V SHUNT INTERVENTION N/A 06/06/2017   Procedure: A/V SHUNT INTERVENTION;  Surgeon: [name], MD;  Location: [facility];  Service: Cardiovascular;  Laterality: N/A;  . AMPUTATION Right 04/25/2016   Procedure: TOE AMPUTATION;  Surgeon: Katha Cabal, MD;  Location: [facility];  Service: Vascular;  Laterality: Right;  . AMPUTATION Right 09/09/2018   Procedure: AMPUTATION ABOVE KNEE;  Surgeon: [name], MD;  Location: [facility];  Service: Vascular;  Laterality: Right;  . APPLICATION OF WOUND VAC Right 04/25/2016   Procedure: APPLICATION OF WOUND VAC;  Surgeon: Katha Cabal, MD;  Location: [facility];  Service: Vascular;  Laterality: Right;  . AV FISTULA PLACEMENT Left 2015  . CARDIAC CATHETERIZATION N/A 04/30/2016   Procedure: Left Heart Cath and Coronary Angiography;  Surgeon: Wellington Hampshire, MD;  Location: [facility];  Service: Cardiovascular;  Laterality: N/A;  .  CATARACT EXTRACTION, BILATERAL    . LEG AMPUTATION BELOW KNEE Left   . LOWER EXTREMITY ANGIOGRAPHY Right 07/24/2018   Procedure: LOWER EXTREMITY ANGIOGRAPHY;  Surgeon: [name], MD;  Location: [facility];  Service: Cardiovascular;  Laterality: Right;  . LOWER EXTREMITY ANGIOGRAPHY Right 08/13/2018   Procedure: LOWER EXTREMITY ANGIOGRAPHY;  Surgeon: [name], MD;  Location: [facility];  Service: Cardiovascular;  Laterality: Right;  . LOWER EXTREMITY ANGIOGRAPHY Right 09/04/2018   Procedure: Lower Extremity Angiography;  Surgeon: [name], MD;  Location:  [facility];  Service: Cardiovascular;  Laterality: Right;  . PERIPHERAL VASCULAR CATHETERIZATION N/A 10/03/2015   Procedure: A/V Shuntogram/Fistulagram;  Surgeon: [name], MD;  Location: [facility];  Service: Cardiovascular;  Laterality: N/A;  . PERIPHERAL VASCULAR CATHETERIZATION N/A 01/16/2016   Procedure: Abdominal Aortogram w/Lower Extremity;  Surgeon: [name], MD;  Location: [facility];  Service: Cardiovascular;  Laterality: N/A;  . PERIPHERAL VASCULAR CATHETERIZATION  01/16/2016   Procedure: Lower Extremity Intervention;  Surgeon: [name], MD;  Location: [facility];  Service: Cardiovascular;;  . PERIPHERAL VASCULAR CATHETERIZATION Right 04/27/2016   Procedure: Lower Extremity Angiography;  Surgeon: Katha Cabal, MD;  Location: [facility];  Service: Cardiovascular;  Laterality: Right;  . PERIPHERAL VASCULAR CATHETERIZATION  04/27/2016   Procedure: Lower Extremity Intervention;  Surgeon: Katha Cabal, MD;  Location: [facility];  Service: Cardiovascular;;  . TRANSMETATARSAL AMPUTATION Right 04/28/2016   Procedure: TRANSMETATARSAL AMPUTATION;  Surgeon: Albertine Patricia, DPM;  Location: [facility];  Service: Podiatry;  Laterality: Right;  . WOUND DEBRIDEMENT Right 04/25/2016   Procedure: DEBRIDEMENT WOUND;  Surgeon: Katha Cabal, MD;  Location: [facility];  Service: Vascular;  Laterality: Right;   Social History   Tobacco Use  . Smoking status: Never Smoker  . Smokeless tobacco: Never Used  Substance Use Topics  . Alcohol use: No  . Drug use: No   Family History  Problem Relation Age of Onset  . Diabetes Father   . Prostate cancer Father     Allergies: No Known Allergies  Current antibiotics: Antibiotics Given (last 72 hours)    Date/Time Action Medication Dose Rate   09/21/18 1714 New Bag/Given   ceFEPIme (MAXIPIME) 2 g in sodium chloride 0.9 % 100 mL IVPB 2 g 200 mL/hr   09/21/18 1803 New Bag/Given    vancomycin (VANCOCIN) IVPB 1000 mg/200 mL premix 1,000 mg 200 mL/hr   09/21/18 2303 New Bag/Given   vancomycin (VANCOCIN) IVPB 1000 mg/200 mL premix 1,000 mg 200 mL/hr   09/22/18 1252 New Bag/Given   vancomycin (VANCOCIN) IVPB 1000 mg/200 mL premix 1,000 mg 200 mL/hr      MEDICATIONS: . sodium chloride   Intravenous Once  . amiodarone  200 mg Oral Daily  . apixaban  5 mg Oral BID  . aspirin EC  81 mg Oral Daily  . cinacalcet  60 mg Oral Q breakfast  . epoetin (EPOGEN/PROCRIT) injection  10,000 Units Intravenous Q M,W,F-HD  . feeding supplement (PRO-STAT SUGAR FREE 64)  30 mL Oral Daily  . ferrous sulfate  325 mg Oral TID WC  . furosemide  20 mg Intravenous Once  . gabapentin  300 mg Oral TID  . glipiZIDE  10 mg Oral Q breakfast  . insulin aspart  0-15 Units Subcutaneous TID WC  . insulin aspart  0-5 Units Subcutaneous QHS  . midodrine  5 mg  Oral TID WC  . multivitamin  1 tablet Oral QHS  . pantoprazole  40 mg Oral Daily  . patiromer  8.4 g Oral Daily  . polyethylene glycol  17 g Oral BID  . senna-docusate  1 tablet Oral BID  . sevelamer carbonate  2,400 mg Oral TID WC  . sodium chloride flush  3 mL Intravenous Q12H  . sodium polystyrene  30 g Oral Once    Review of Systems - 11 systems reviewed and negative per HPI   OBJECTIVE: Temp:  [97.7 F (36.5 C)-100.9 F (38.3 C)] 97.7 F (36.5 C) (12/09 1355) Pulse Rate:  [65-107] 107 (12/09 1355) Resp:  [9-25] 16 (12/09 1355) BP: (73-106)/(30-68) 87/51 (12/09 1355) SpO2:  [90 %-100 %] 98 % (12/09 1355) Weight:  [102.1 kg-115.8 kg] 113.4 kg (12/09 1355) Physical Exam  Constitutional: obese, lying in bed, alert HENT: anicteric, R ear with some moist fluid in ear canal but TM appears intact without redness or fluid Mouth/Throat: Oropharynx is clear and moist. No oropharyngeal exudate.  Cardiovascular: tachy irregular Pulmonary/Chest: Effort normal and breath sounds normal. No respiratory distress. He has no wheezes.   Abdominal: obese Soft. Bowel sounds are normal. He exhibits no distension. There is no tenderness.  Lymphadenopathy: He has no cervical adenopathy.  Neurological: He is alert and oriented to person, place, and time.  Skin: Skin is warm and dry. No rash noted. No erythema.  R BKA site has very well approximated incision site with no drainage, redness or tenderness Psychiatric: He has a normal mood and affect. His behavior is normal.  AVF LUE wnl, non tender   LABS: Results for orders placed or performed during the hospital encounter of 09/21/18 (from the past 48 hour(s))  Blood Culture (routine x 2)     Status: None (Preliminary result)   Collection Time: 09/21/18  3:00 PM  Result Value Ref Range   Specimen Description BLOOD R AC    Special Requests      BOTTLES DRAWN AEROBIC AND ANAEROBIC Blood Culture results may not be optimal due to an excessive volume of blood received in culture bottles   Culture      NO GROWTH < 24 HOURS Performed at [facility], [address] [place], [place] [address]    Report Status PENDING   Blood Culture (routine x 2)     Status: None (Preliminary result)   Collection Time: 09/21/18  3:00 PM  Result Value Ref Range   Specimen Description BLOOD R HAND    Special Requests      BOTTLES DRAWN AEROBIC AND ANAEROBIC Blood Culture adequate volume   Culture      NO GROWTH < 24 HOURS Performed at [facility], [address] [place], [place] [address]    Report Status PENDING   Basic metabolic panel     Status: Abnormal   Collection Time: 09/21/18  3:31 PM  Result Value Ref Range   Sodium 132 (L) 135 - 145 mmol/L   Potassium 6.3 (HH) 3.5 - 5.1 mmol/L    Comment: CRITICAL RESULT CALLED TO, READ BACK BY AND VERIFIED WITH SONJIA WEAVER ON 09/21/18 AT 1621 QSD    Chloride 93 (L) 98 - 111 mmol/L   CO2 27 22 - 32 mmol/L   Glucose, Bld 254 (H) 70 - 99 mg/dL   BUN 61 (H) 6 - 20 mg/dL   Creatinine, Ser 8.07 (H) 0.61 - 1.24 mg/dL    Calcium 8.5 (L) 8.9 - 10.3  mg/dL   GFR calc non Af Amer 7 (L) >60 mL/min   GFR calc Af Amer 8 (L) >60 mL/min   Anion gap 12 5 - 15    Comment: Performed at [facility], [address] [place], [place] [address]  CBC     Status: Abnormal   Collection Time: 09/21/18  3:31 PM  Result Value Ref Range   WBC 28.1 (H) 4.0 - 10.5 K/uL   RBC 2.72 (L) 4.22 - 5.81 MIL/uL   Hemoglobin 8.0 (L) 13.0 - 17.0 g/dL   HCT 26.8 (L) 39.0 - 52.0 %   MCV 98.5 80.0 - 100.0 fL   MCH 29.4 26.0 - 34.0 pg   MCHC 29.9 (L) 30.0 - 36.0 g/dL   RDW 16.1 (H) 11.5 - 15.5 %   Platelets 576 (H) 150 - 400 K/uL   nRBC 0.0 0.0 - 0.2 %    Comment: Performed at [facility], [address] [place], [place] [address]  Troponin I - ONCE - STAT     Status: Abnormal   Collection Time: 09/21/18  3:31 PM  Result Value Ref Range   Troponin I 0.06 (HH) <0.03 ng/mL    Comment: CRITICAL RESULT CALLED TO, READ BACK BY AND VERIFIED WITH SONJIA WEAVER ON 09/21/18 AT 1621 QSD Performed at [facility], [address] [place], [place] [address]   CG4 I-STAT (Lactic acid)     Status: Abnormal   Collection Time: 09/21/18  3:46 PM  Result Value Ref Range   Lactic Acid, Venous 2.30 (HH) 0.5 - 1.9 mmol/L   Comment NOTIFIED PHYSICIAN   Influenza panel by PCR (type A & B)     Status: None   Collection Time: 09/21/18  5:17 PM  Result Value Ref Range   Influenza A By PCR NEGATIVE NEGATIVE   Influenza B By PCR NEGATIVE NEGATIVE    Comment: (NOTE) The Xpert Xpress Flu assay is intended as an aid in the diagnosis of  influenza and should not be used as a sole basis for treatment.  This  assay is FDA approved for nasopharyngeal swab specimens only. Nasal  washings and aspirates are unacceptable for Xpert Xpress Flu testing. Performed at [facility], [place], [place] [address]   CG4 I-STAT (Lactic acid)     Status: None   Collection Time: 09/21/18  7:03 PM  Result Value Ref Range    Lactic Acid, Venous 0.65 0.5 - 1.9 mmol/L  Lactic acid, plasma     Status: Abnormal   Collection Time: 09/21/18  8:47 PM  Result Value Ref Range   Lactic Acid, Venous 2.3 (HH) 0.5 - 1.9 mmol/L    Comment: CRITICAL RESULT CALLED TO, READ BACK BY AND VERIFIED WITH DAWN SONGSTER ON 09/21/18 AT 2119 QSD Performed at [facility], [address] [place], [place] [address]   Glucose, capillary     Status: Abnormal   Collection Time: 09/21/18  8:47 PM  Result Value Ref Range   Glucose-Capillary 200 (H) 70 - 99 mg/dL  MRSA PCR Screening     Status: None   Collection Time: 09/21/18 11:36 PM  Result Value Ref Range   MRSA by PCR NEGATIVE NEGATIVE    Comment:        The GeneXpert MRSA Assay (FDA approved for NASAL specimens only), is one component of a comprehensive MRSA colonization surveillance program. It is not intended to diagnose MRSA infection nor to guide or monitor treatment  for MRSA infections. Performed at [facility], [address] [place], [place] [address]   Lactic acid, plasma     Status: None   Collection Time: 09/22/18 12:27 AM  Result Value Ref Range   Lactic Acid, Venous 1.9 0.5 - 1.9 mmol/L    Comment: Performed at [facility], [address] [place], [place] [address]  Vancomycin, random     Status: None   Collection Time: 09/22/18  4:18 AM  Result Value Ref Range   Vancomycin Rm 21     Comment:        Random Vancomycin therapeutic range is dependent on dosage and time of specimen collection. A peak range is 20.0-40.0 ug/mL A trough range is 5.0-15.0 ug/mL        Performed at [facility], [address] [place], [place] [address]   Glucose, capillary     Status: Abnormal   Collection Time: 09/22/18  7:55 AM  Result Value Ref Range   Glucose-Capillary 130 (H) 70 - 99 mg/dL   Comment 1 Notify RN   Renal function panel     Status: Abnormal   Collection Time: 09/22/18  8:38 AM  Result Value Ref Range    Sodium 132 (L) 135 - 145 mmol/L   Potassium 5.9 (H) 3.5 - 5.1 mmol/L   Chloride 95 (L) 98 - 111 mmol/L   CO2 25 22 - 32 mmol/L   Glucose, Bld 158 (H) 70 - 99 mg/dL   BUN 78 (H) 6 - 20 mg/dL   Creatinine, Ser 8.93 (H) 0.61 - 1.24 mg/dL   Calcium 8.2 (L) 8.9 - 10.3 mg/dL   Phosphorus 4.2 2.5 - 4.6 mg/dL   Albumin 2.8 (L) 3.5 - 5.0 g/dL   GFR calc non Af Amer 6 (L) >60 mL/min   GFR calc Af Amer 7 (L) >60 mL/min   Anion gap 12 5 - 15    Comment: Performed at [facility], [address] [place], [place] [address]  CBC     Status: Abnormal   Collection Time: 09/22/18  8:38 AM  Result Value Ref Range   WBC 19.4 (H) 4.0 - 10.5 K/uL   RBC 2.33 (L) 4.22 - 5.81 MIL/uL   Hemoglobin 6.8 (L) 13.0 - 17.0 g/dL   HCT 23.3 (L) 39.0 - 52.0 %   MCV 100.0 80.0 - 100.0 fL   MCH 29.2 26.0 - 34.0 pg   MCHC 29.2 (L) 30.0 - 36.0 g/dL   RDW 16.3 (H) 11.5 - 15.5 %   Platelets 419 (H) 150 - 400 K/uL   nRBC 0.0 0.0 - 0.2 %    Comment: Performed at [facility], [address], [place], [place] [address]  Prepare RBC     Status: None (Preliminary result)   Collection Time: 09/22/18 12:34 PM  Result Value Ref Range   Order Confirmation PENDING    No components found for: ESR, C REACTIVE PROTEIN MICRO: Recent Results (from the past 720 hour(s))  Blood Culture (routine x 2)     Status: None (Preliminary result)   Collection Time: 09/21/18  3:00 PM  Result Value Ref Range Status   Specimen Description BLOOD R AC  Final   Special Requests   Final    BOTTLES DRAWN AEROBIC AND ANAEROBIC Blood Culture results may not be optimal due to an excessive volume of blood received in culture bottles   Culture   Final    NO GROWTH < 24 HOURS  Performed at [facility], [address] [place], [place] [address]    Report Status PENDING  Incomplete  Blood Culture (routine x 2)     Status: None (Preliminary result)   Collection Time: 09/21/18  3:00 PM  Result Value Ref Range Status    Specimen Description BLOOD R HAND  Final   Special Requests   Final    BOTTLES DRAWN AEROBIC AND ANAEROBIC Blood Culture adequate volume   Culture   Final    NO GROWTH < 24 HOURS Performed at [facility], [address], [place], [place] [address]    Report Status PENDING  Incomplete  MRSA PCR Screening     Status: None   Collection Time: 09/21/18 11:36 PM  Result Value Ref Range Status   MRSA by PCR NEGATIVE NEGATIVE Final    Comment:        The GeneXpert MRSA Assay (FDA approved for NASAL specimens only), is one component of a comprehensive MRSA colonization surveillance program. It is not intended to diagnose MRSA infection nor to guide or monitor treatment for MRSA infections. Performed at [facility], [address] [place], [place] [address]     IMAGING: Dg Chest 2 View  Result Date: 09/21/2018 CLINICAL DATA:  Chest pain. EXAM: CHEST - 2 VIEW COMPARISON:  05/02/2016 FINDINGS: Lateral views degraded by motion and patient size. Midline trachea. Moderate cardiomegaly. Atherosclerosis in the transverse aorta. No pleural effusion or pneumothorax. No overt congestive failure. Chronic interstitial thickening may represent mild pulmonary venous congestion. No lobar consolidation. IMPRESSION: Cardiomegaly with similar pulmonary venous congestion. Aortic Atherosclerosis (ICD10-I70.0). Electronically Signed   By: Abigail Miyamoto M.D.   On: 09/21/2018 16:08   Ct Head Wo Contrast  Result Date: 09/21/2018 CLINICAL DATA:  Pt arrives [facility], pt started c/o chest pain at 1100 this am and was given tylenol, pt cont to have pain and started jerking so staff called 911, pt had a fever of 102.7 per ems, pt was given another dose of tylenol at 1400 of 645m. Pt had a recent bka on the right side and pt also was diagnosed with MI early dec EXAM: CT HEAD WITHOUT CONTRAST TECHNIQUE: Contiguous axial images were obtained from the base of the skull through the  vertex without intravenous contrast. COMPARISON:  04/28/2016 FINDINGS: Brain: No evidence of acute infarction, hemorrhage, hydrocephalus, extra-axial collection or mass lesion/mass effect. There is ventricular and sulcal enlargement reflecting mild generalized atrophy, advanced for age. Patchy periventricular white matter hypoattenuation is also noted consistent with mild chronic microvascular ischemic change. Vascular: No hyperdense vessel or unexpected calcification. Skull: Normal. Negative for fracture or focal lesion. Sinuses/Orbits: Visualized globes and orbits are unremarkable. The visualized sinuses and mastoid air cells are clear. Other: None. IMPRESSION: 1. No acute intracranial abnormalities. 2. Atrophy advanced for age. Mild chronic microvascular ischemic change. Electronically Signed   By: DLajean ManesM.D.   On: 09/21/2018 16:57   Vas UKoreaABurnard BuntingWith/wo Tbi  Result Date: 08/29/2018 LOWER EXTREMITY DOPPLER STUDY Indications: Gangrene, peripheral artery disease, and Left BKA; Right all toe              amputation. Right PTA and stent placed SFA 08/13/2018. High Risk Factors: Diabetes.  Comparison Study: none Performing Technologist: TConcha NorwayRVT  Examination Guidelines: A complete evaluation includes at minimum, Doppler waveform signals and systolic blood pressure reading at the level of bilateral brachial, anterior tibial, and posterior tibial arteries, when vessel segments are accessible.  Bilateral testing is considered an integral part of a complete examination. Photoelectric Plethysmograph (PPG) waveforms and toe systolic pressure readings are included as required and additional duplex testing as needed. Limited examinations for reoccurring indications may be performed as noted.  ABI Findings: +--------+------------------+-----+----------+--------+ Right   Rt Pressure (mmHg)IndexWaveform  Comment  +--------+------------------+-----+----------+--------+ [id number]                                        +--------+------------------+-----+----------+--------+ ATA     187               1.08 monophasic         +--------+------------------+-----+----------+--------+ PTA     182               1.05 monophasic         +--------+------------------+-----+----------+--------+ +--------+------------------+-----+--------+---------------------+ Left    Lt Pressure (mmHg)IndexWaveformComment               +--------+------------------+-----+--------+---------------------+ Brachial                               Left AVF, BP not done +--------+------------------+-----+--------+---------------------+ +-------+-----------+-----------+------------+------------+ ABI/TBIToday's ABIToday's TBIPrevious ABIPrevious TBI +-------+-----------+-----------+------------+------------+ Right  1.07                                           +-------+-----------+-----------+------------+------------+  Summary: Left: Resting left ankle-brachial index is within normal range. No evidence of significant left lower extremity arterial disease. Strong monophasic flow in the PTA, ATA.  *See table(s) above for measurements and observations.  Electronically signed by Leotis Pain MD on 08/29/2018 at 2:30:44 PM.   Final    Vas US Duplex Dialysis Access (avf,avg)  Result Date: 08/29/2018 DIALYSIS ACCESS Access Site: Left Upper Extremity. Access Type: Radial-cephalic AVF. History: End stage renal disease requiring chronic dialysis. LT radceph AVF          created >5 years. Comparison Study: 02/20/2018 Performing Technologist: Concha Norway RVT  Examination Guidelines: A complete evaluation includes B-mode imaging, spectral Doppler, color Doppler, and power Doppler as needed of all accessible portions of each vessel. Unilateral testing is considered an integral part of a complete examination. Limited examinations for reoccurring indications may be performed as noted.  Findings:  +--------------------+----------+-----------------+--------+ AVF                 PSV (cm/s)Flow Vol (mL/min)Comments +--------------------+----------+-----------------+--------+ Native artery inflow   144          1080                +--------------------+----------+-----------------+--------+ AVF Anastomosis        231                              +--------------------+----------+-----------------+--------+  +-------------+----------+-------------+----------+--------+ OUTFLOW VEIN PSV (cm/s)Diameter (cm)Depth (cm)Describe +-------------+----------+-------------+----------+--------+ Axillary vein   150                                    +-------------+----------+-------------+----------+--------+ Confluence      143                                    +-------------+----------+-------------+----------+--------+  Prox UA          81                                    +-------------+----------+-------------+----------+--------+ Mid UA           60                                    +-------------+----------+-------------+----------+--------+ Dist UA          46                                    +-------------+----------+-------------+----------+--------+ AC Fossa         66                                    +-------------+----------+-------------+----------+--------+ Prox Forearm     58                                    +-------------+----------+-------------+----------+--------+ Mid Forearm      44                                    +-------------+----------+-------------+----------+--------+ Dist Forearm     89                                    +-------------+----------+-------------+----------+--------+  +-----------------+-------------+---------+---------+----------+---------------+                  Diameter (cm)  Depth  BranchingPSV (cm/s)  Flow Volume                                   (cm)                         (ml/min)      +-----------------+-------------+---------+---------+----------+---------------+ Left radial                                        -75                    distal                                                                    +-----------------+-------------+---------+---------+----------+---------------+ Retrograde                                                                +-----------------+-------------+---------+---------+----------+---------------+  Summary: Patent radial cephalic AVF with no evidence of stenosis. The distal radial artery is retrograde.  *See table(s) above for measurements and observations.  Diagnosing physician: Leotis Pain MD Electronically signed by Leotis Pain MD on 08/29/2018 at 2:30:41 PM.    --------------------------------------------------------------------------------   Final     Assessment:   [name] is a 57 y.o. male with ESRD, PAD, recent R BKA now admitted from NH with fevers, chills, hypotension, wbc 28, some diarrhea, CP and rapid A fib.  His R BKA stump site appears very clean and intact.  His AVF site LUE also appears normal. Flu negative. CT head neg and CXR neg for infection.  I think bacteremia from AVF site is most likely etiology, and cultures are pending. No evidence of meningitis despite some "shaking" which I suspect were rigors.  GI source also possible since some diarrhea.   Recommendations Continue cefepime and vanco pending culture results. Vascular surgery to evaluate BKA site. Thank you very much for allowing me to participate in the care of this patient. Please call with questions.   Cheral Marker. Ola Spurr, MD

## 2018-09-22 NOTE — Progress Notes (Signed)
HD tx start    09/22/18 1010  Vital Signs  Pulse Rate 70  Pulse Rate Source Monitor  Resp 14  BP (!) 101/51  BP Location Right Arm  BP Method Automatic  Patient Position (if appropriate) Lying  Oxygen Therapy  SpO2 94 %  O2 Device Room Air  During Hemodialysis Assessment  Blood Flow Rate (mL/min) 400 mL/min  Arterial Pressure (mmHg) -150 mmHg  Venous Pressure (mmHg) 160 mmHg  Transmembrane Pressure (mmHg) 70 mmHg  Ultrafiltration Rate (mL/min) 860 mL/min  Dialysate Flow Rate (mL/min) 600 ml/min  Conductivity: Machine  13.7  HD Safety Checks Performed Yes  Dialysis Fluid Bolus Normal Saline  Bolus Amount (mL) 250 mL  Intra-Hemodialysis Comments Tx initiated  Fistula / Graft Left Forearm Arteriovenous fistula  No Placement Date or Time found.   Placed prior to admission: Yes  Orientation: Left  Access Location: Forearm  Access Type: Arteriovenous fistula  Status Accessed  Needle Size 15

## 2018-09-22 NOTE — Progress Notes (Signed)
[facility]  ROUNDING NOTE   Subjective:   Seen and examined on hemodialysis.     HEMODIALYSIS FLOWSHEET:  Blood Flow Rate (mL/min): 400 mL/min Arterial Pressure (mmHg): -180 mmHg Venous Pressure (mmHg): 160 mmHg Transmembrane Pressure (mmHg): 80 mmHg Ultrafiltration Rate (mL/min): 1080 mL/min Dialysate Flow Rate (mL/min): 600 ml/min Conductivity: Machine : 13.8 Conductivity: Machine : 13.8 Dialysis Fluid Bolus: Normal Saline Bolus Amount (mL): 250 mL    Objective:  Vital signs in last 24 hours:  Temp:  [97.7 F (36.5 C)-100.6 F (38.1 C)] 98.9 F (37.2 C) (12/09 1420) Pulse Rate:  [65-107] 78 (12/09 1420) Resp:  [9-25] 13 (12/09 1400) BP: (73-126)/(30-81) 126/81 (12/09 1420) SpO2:  [87 %-100 %] 87 % (12/09 1420) Weight:  [113.4 kg-115.8 kg] 113.4 kg (12/09 1355)  Weight change:  Filed Weights   09/21/18 1519 09/22/18 0943 09/22/18 1355  Weight: 102.1 kg 115.8 kg 113.4 kg    Intake/Output: I/O last 3 completed shifts: In: 1749.7 [P.O.:236; I.V.:13.7; IV Piggyback:1500] Out: 0    Intake/Output this shift:  Total I/O In: -  Out: 2518 [Other:2518]  Physical Exam: General: NAD,   Head: Normocephalic, atraumatic. Moist oral mucosal membranes  Eyes: Anicteric, PERRL  Neck: Supple, trachea midline  Lungs:  Clear to auscultation  Heart: Regular rate and rhythm  Abdomen:  Soft, nontender,   Extremities:  bilateral amputations  Neurologic: Nonfocal, moving all four extremities  Skin: No lesions  Access: AVF    Basic Metabolic Panel: Recent Labs  Lab 09/21/18 1531 09/22/18 0838  NA 132* 132*  K 6.3* 5.9*  CL 93* 95*  CO2 27 25  GLUCOSE 254* 158*  BUN 61* 78*  CREATININE 8.07* 8.93*  CALCIUM 8.5* 8.2*  PHOS  --  4.2    Liver Function Tests: Recent Labs  Lab 09/22/18 0838  ALBUMIN 2.8*   No results for input(s): LIPASE, AMYLASE in the last 168 hours. No results for input(s): AMMONIA in the last 168 hours.  CBC: Recent Labs   Lab 09/21/18 1531 09/22/18 0838  WBC 28.1* 19.4*  HGB 8.0* 6.8*  HCT 26.8* 23.3*  MCV 98.5 100.0  PLT 576* 419*    Cardiac Enzymes: Recent Labs  Lab 09/21/18 1531 09/22/18 1502  TROPONINI 0.06* 0.06*    BNP: Invalid input(s): POCBNP  CBG: Recent Labs  Lab 09/15/18 1746 09/21/18 2047 09/22/18 0755 09/22/18 1429  GLUCAP 200* 200* 130* 150*    Microbiology: Results for orders placed or performed during the hospital encounter of 09/21/18  Blood Culture (routine x 2)     Status: None (Preliminary result)   Collection Time: 09/21/18  3:00 PM  Result Value Ref Range Status   Specimen Description BLOOD R AC  Final   Special Requests   Final    BOTTLES DRAWN AEROBIC AND ANAEROBIC Blood Culture results may not be optimal due to an excessive volume of blood received in culture bottles   Culture   Final    NO GROWTH < 24 HOURS Performed at [facility], [address] [place], [place] [address]    Report Status PENDING  Incomplete  Blood Culture (routine x 2)     Status: None (Preliminary result)   Collection Time: 09/21/18  3:00 PM  Result Value Ref Range Status   Specimen Description BLOOD R HAND  Final   Special Requests   Final    BOTTLES DRAWN AEROBIC AND ANAEROBIC Blood Culture adequate volume   Culture   Final  NO GROWTH < 24 HOURS Performed at [facility], [address] [place], [place] [address]    Report Status PENDING  Incomplete  MRSA PCR Screening     Status: None   Collection Time: 09/21/18 11:36 PM  Result Value Ref Range Status   MRSA by PCR NEGATIVE NEGATIVE Final    Comment:        The GeneXpert MRSA Assay (FDA approved for NASAL specimens only), is one component of a comprehensive MRSA colonization surveillance program. It is not intended to diagnose MRSA infection nor to guide or monitor treatment for MRSA infections. Performed at [facility], [address] [place], [place] [address]      Coagulation Studies: No results for input(s): LABPROT, INR in the last 72 hours.  Urinalysis: No results for input(s): COLORURINE, LABSPEC, PHURINE, GLUCOSEU, HGBUR, BILIRUBINUR, KETONESUR, PROTEINUR, UROBILINOGEN, NITRITE, LEUKOCYTESUR in the last 72 hours.  Invalid input(s): APPERANCEUR    Imaging: Dg Chest 2 View  Result Date: 09/21/2018 CLINICAL DATA:  Chest pain. EXAM: CHEST - 2 VIEW COMPARISON:  05/02/2016 FINDINGS: Lateral views degraded by motion and patient size. Midline trachea. Moderate cardiomegaly. Atherosclerosis in the transverse aorta. No pleural effusion or pneumothorax. No overt congestive failure. Chronic interstitial thickening may represent mild pulmonary venous congestion. No lobar consolidation. IMPRESSION: Cardiomegaly with similar pulmonary venous congestion. Aortic Atherosclerosis (ICD10-I70.0). Electronically Signed   By: Abigail Miyamoto M.D.   On: 09/21/2018 16:08   Ct Head Wo Contrast  Result Date: 09/21/2018 CLINICAL DATA:  Pt arrives via ems from [facility], pt started c/o chest pain at 1100 this am and was given tylenol, pt cont to have pain and started jerking so staff called 911, pt had a fever of 102.7 per ems, pt was given another dose of tylenol at 1400 of 650mg . 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 COMPARISON:  04/28/2016 FINDINGS: Brain: No evidence of acute infarction, hemorrhage, hydrocephalus, extra-axial collection or mass lesion/mass effect. There is ventricular and sulcal enlargement reflecting mild generalized atrophy, advanced for age. Patchy periventricular white matter hypoattenuation is also noted consistent with mild chronic microvascular ischemic change. Vascular: No hyperdense vessel or unexpected calcification. Skull: Normal. Negative for  fracture or focal lesion. Sinuses/Orbits: Visualized globes and orbits are unremarkable. The visualized sinuses and mastoid air cells are clear. Other: None. IMPRESSION: 1. No acute intracranial abnormalities. 2. Atrophy advanced for age. Mild chronic microvascular ischemic change. Electronically Signed   By: Lajean Manes M.D.   On: 09/21/2018 16:57     Medications:   . sodium chloride Stopped (09/22/18 0122)  . ceFEPime (MAXIPIME) IV    . vancomycin Stopped (09/22/18 1352)   . sodium chloride   Intravenous Once  . amiodarone  200 mg Oral Daily  . apixaban  5 mg Oral BID  . aspirin EC  81 mg Oral Daily  . cinacalcet  60 mg Oral Q breakfast  . ciprofloxacin-dexamethasone  4 drop Right EAR BID  . feeding supplement (PRO-STAT SUGAR FREE 64)  30 mL Oral Daily  . ferrous sulfate  325 mg Oral TID WC  . furosemide  20 mg Intravenous Once  . gabapentin  300 mg Oral TID  . glipiZIDE  10 mg Oral Q breakfast  . insulin aspart  0-15 Units Subcutaneous TID WC  .  insulin aspart  0-5 Units Subcutaneous QHS  . midodrine  5 mg Oral TID WC  . multivitamin  1 tablet Oral QHS  . pantoprazole  40 mg Oral Daily  . patiromer  8.4 g Oral Daily  . polyethylene glycol  17 g Oral BID  . senna-docusate  1 tablet Oral BID  . sevelamer carbonate  2,400 mg Oral TID WC  . sodium chloride flush  3 mL Intravenous Q12H  . sodium polystyrene  30 g Oral Once   sodium chloride, acetaminophen, hydrALAZINE, lidocaine-prilocaine, midodrine, ondansetron **OR** ondansetron (ZOFRAN) IV, oxyCODONE, sodium chloride flush  Assessment/ Plan:  [name] is a 57 y.o. Hispanic (Spanish speaking only) malewith end stage renal disease, peripheral vascular disease, hypertension, CVA, hyperlipidemia, diabetes mellitus type II who is admitted to [facility] 106.5 kg Left AVF  1. End Stage Renal Disease:    Patient seen and evaluated during hemodialysis tolerating well.        2. Anemia of  chronic kidney disease: 6.8 - PRBC transfusion for today - EPO with HD treatment  3. Hypotension - Midodrine before dialysis treatments  4. Secondary Hyperparathyroidism:  - continue Renvela and Sensipar with meals.    LOS: 1 Sabrine Patchen 12/9/20193:39 PM

## 2018-09-22 NOTE — Progress Notes (Signed)
Post HD assessment    09/22/18 1354  Neurological  Level of Consciousness Alert  Orientation Level Oriented X4  Respiratory  Respiratory Pattern Regular;Unlabored  Chest Assessment Chest expansion symmetrical  Cardiac  Pulse Irregular  Cardiac Rhythm ST  Vascular  R Radial Pulse +2  L Radial Pulse +2  Edema Generalized  Integumentary  Integumentary (WDL) X  Skin Color Appropriate for ethnicity  Musculoskeletal  Musculoskeletal (WDL) X  Generalized Weakness Yes  Assistive Device None  GU Assessment  Genitourinary (WDL) X  Genitourinary Symptoms  (HD)  Psychosocial  Psychosocial (WDL) WDL

## 2018-09-22 NOTE — Progress Notes (Signed)
Pre HD assessment    09/22/18 0943  Vital Signs  Temp 99.6 F (37.6 C)  Temp Source Oral  Pulse Rate 71  Pulse Rate Source Monitor  Resp 17  BP (!) 106/53  BP Location Right Arm  BP Method Automatic  Patient Position (if appropriate) Lying  Oxygen Therapy  SpO2 90 %  O2 Device Room Air  Pain Assessment  Pain Scale 0-10  Pain Score 0  Dialysis Weight  Weight 115.8 kg  Type of Weight Pre-Dialysis  Time-Out for Hemodialysis  What Procedure? HD  Pt Identifiers(min of two) First/Last Name;MRN/Account#  Correct Site? Yes  Correct Side? Yes  Correct Procedure? Yes  Consents Verified? Yes  Rad Studies Available? N/A  Safety Precautions Reviewed? Yes  Engineer, civil (consulting) Number  (6A)  Station Number 1  UF/Alarm Test Passed  Conductivity: Meter 13.8  Conductivity: Machine  13.7  pH 7.4  Reverse Osmosis main  Normal Saline Lot Number [id number]  Dialyzer Lot Number [id number]  Disposable Set Lot Number [id number]  Machine Temperature 98.6 F (37 C)  Musician and Audible Yes  Blood Lines Intact and Secured Yes  Pre Treatment Patient Checks  Vascular access used during treatment Fistula  Hepatitis B Surface Antigen Results Negative  Date Hepatitis B Surface Antigen Drawn 08/13/18  Hepatitis B Surface Antibody 270.4 (>10)  Date Hepatitis B Surface Antibody Drawn 08/13/18  Hemodialysis Consent Verified Yes  Hemodialysis Standing Orders Initiated Yes  ECG (Telemetry) Monitor On Yes  Prime Ordered Normal Saline  Length of  DialysisTreatment -hour(s) 3.5 Hour(s)  Dialyzer Elisio 17H NR  Dialysate 2K, 2.5 Ca  Dialysis Anticoagulant None  Dialysate Flow Ordered 600  Blood Flow Rate Ordered 400 mL/min  Ultrafiltration Goal 2.5 Liters  Pre Treatment Labs Renal panel;CBC  Dialysis Blood Pressure Support Ordered Normal Saline  Education / Care Plan  Dialysis Education Provided Yes  Documented Education in Care Plan Yes  Fistula / Graft Left Forearm Arteriovenous  fistula  No Placement Date or Time found.   Placed prior to admission: Yes  Orientation: Left  Access Location: Forearm  Access Type: Arteriovenous fistula  Site Condition No complications  Fistula / Graft Assessment Present;Thrill;Bruit  Drainage Description None

## 2018-09-22 NOTE — Progress Notes (Signed)
Inpatient Diabetes Program Recommendations  AACE/ADA: New Consensus Statement on Inpatient Glycemic Control (2015)  Target Ranges:  Prepandial:   less than 140 mg/dL      Peak postprandial:   less than 180 mg/dL (1-2 hours)      Critically ill patients:  140 - 180 mg/dL   Results for JIANNI, BATTEN (MRN [id number]) as of 09/22/2018 10:11  Ref. Range 09/21/2018 20:47 09/22/2018 07:55  Glucose-Capillary Latest Ref Range: 70 - 99 mg/dL 200 (H) 130 (H)    Admit with: Acute Sepsis/ Recent R AKA  History: DM, ESRD  Home DM Meds: Glipizide 10 mg Daily  Current Orders: Glipizide 10 mg Daily      Novolog Moderate Correction Scale/ SSI (0-15 units) TID AC + HS      Note that Glipizide and Novolog SSi to start this AM.  CBG 130 mg/dl today at 7:55am.  Scheduled for Dialysis this AM.  No recommendations yet since medications just started.     --Will follow patient during hospitalization--  Wyn Quaker RN, MSN, CDE Diabetes Coordinator Inpatient Glycemic Control Team Team Pager: [phone] (8a-5p)

## 2018-09-22 NOTE — Progress Notes (Signed)
Post HD assessment. PT tolerated tx well without c/o or complication. Net UF 2518, goal met.    09/22/18 1355  Vital Signs  Temp 97.7 F (36.5 C)  Temp Source Oral  Pulse Rate (!) 107  Pulse Rate Source Monitor  Resp 16  BP (!) 87/51 (pt stable on phone )  BP Location Right Arm  BP Method Automatic  Patient Position (if appropriate) Lying  Oxygen Therapy  SpO2 98 %  O2 Device Room Air  Dialysis Weight  Weight 113.4 kg  Type of Weight Post-Dialysis  Post-Hemodialysis Assessment  Rinseback Volume (mL) 250 mL  KECN 78.7 V  Dialyzer Clearance Lightly streaked  Duration of HD Treatment -hour(s) 3.5 hour(s)  Hemodialysis Intake (mL) 700 mL  UF Total -Machine (mL) 3218 mL  Net UF (mL) 2518 mL  Tolerated HD Treatment Yes  AVG/AVF Arterial Site Held (minutes) 10 minutes  AVG/AVF Venous Site Held (minutes) 10 minutes  Education / Care Plan  Dialysis Education Provided Yes  Documented Education in Care Plan Yes  Fistula / Graft Left Forearm Arteriovenous fistula  No Placement Date or Time found.   Placed prior to admission: Yes  Orientation: Left  Access Location: Forearm  Access Type: Arteriovenous fistula  Site Condition No complications  Fistula / Graft Assessment Present;Thrill;Bruit  Status Deaccessed  Drainage Description None

## 2018-09-22 NOTE — Progress Notes (Signed)
Patient known to [place].  Admitted for possible sepsis.  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  He will need motorized wheelchair due to immobility and this may be started while she is in hospital.  No other recs from Vascular POV.  RTC in 2 weeks for wound check and staple removal

## 2018-09-22 NOTE — Consult Note (Signed)
Pharmacy Antibiotic Note  [name] is a 57 y.o. male admitted on 09/21/2018 with acute sepsis secondary to unknown etiology.   Pharmacy has been consulted for Vancomycin/Cefepime dosing.  Plan:  Patient has ESRD and is on hemodialysis.  Patient received Cefepime 2g IV in ED 12/8 @ 1714 Patient received Vancomycin 1g IV in ED 12/8 @ 1803  Patient will receive an additional dose of Vancomycin to finish his loading dose of 1000mg  for a total loading dose of 2000mg .    Patient will receive 1g Vancomycin IV after each dialyses session and  1g q 24h Cefepime IV (after dialyses session on dialysis days)  Pt will have a random vanc drawn prior to 3rd HD to assess therapeutic level.  Target pre-HD level is 15-25 mcg/ml    Height: 5\' 10"  (177.8 cm) Weight: 225 lb (102.1 kg) IBW/kg (Calculated) : 73  Temp (24hrs), Avg:99.5 F (37.5 C), Min:98.3 F (36.8 C), Max:100.9 F (38.3 C)  Recent Labs  Lab 09/21/18 1531 09/21/18 1546 09/21/18 1903 09/21/18 2047 09/22/18 0027 09/22/18 0418 09/22/18 0838  WBC 28.1*  --   --   --   --   --  19.4*  CREATININE 8.07*  --   --   --   --   --  8.93*  LATICACIDVEN  --  2.30* 0.65 2.3* 1.9  --   --   VANCORANDOM  --   --   --   --   --  21  --     Estimated Creatinine Clearance: 10.7 mL/min (A) (by C-G formula based on SCr of 8.93 mg/dL (H)).    No Known Allergies  Antimicrobials this admission: Vancomycin 12/8 >>    Cefepime 12/8 >>   Dose adjustments this admission: None at this time  Microbiology results: 12/8 BCx: pending   Thank you for allowing pharmacy to be a part of this patient's care.  Paulina Fusi, PharmD, BCPS 09/22/2018 10:58 AM

## 2018-09-23 DIAGNOSIS — I480 Paroxysmal atrial fibrillation: Secondary | ICD-10-CM

## 2018-09-23 DIAGNOSIS — I248 Other forms of acute ischemic heart disease: Secondary | ICD-10-CM

## 2018-09-23 DIAGNOSIS — A419 Sepsis, unspecified organism: Principal | ICD-10-CM

## 2018-09-23 LAB — BASIC METABOLIC PANEL
ANION GAP: 11 (ref 5–15)
BUN: 50 mg/dL — ABNORMAL HIGH (ref 6–20)
CO2: 29 mmol/L (ref 22–32)
Calcium: 8.5 mg/dL — ABNORMAL LOW (ref 8.9–10.3)
Chloride: 95 mmol/L — ABNORMAL LOW (ref 98–111)
Creatinine, Ser: 6.05 mg/dL — ABNORMAL HIGH (ref 0.61–1.24)
GFR calc Af Amer: 11 mL/min — ABNORMAL LOW (ref >60)
GFR calc non Af Amer: 9 mL/min — ABNORMAL LOW (ref >60)
Glucose, Bld: 201 mg/dL — ABNORMAL HIGH (ref 70–99)
Potassium: 4.7 mmol/L (ref 3.5–5.1)
Sodium: 135 mmol/L (ref 135–145)

## 2018-09-23 LAB — GLUCOSE, CAPILLARY
GLUCOSE-CAPILLARY: 199 mg/dL — AB (ref 70–99)
Glucose-Capillary: 154 mg/dL — ABNORMAL HIGH (ref 70–99)
Glucose-Capillary: 196 mg/dL — ABNORMAL HIGH (ref 70–99)
Glucose-Capillary: 168 mg/dL — ABNORMAL HIGH (ref 70–99)
Glucose-Capillary: 214 mg/dL — ABNORMAL HIGH (ref 70–99)

## 2018-09-23 LAB — CBC
HCT: 24.8 % — ABNORMAL LOW (ref 39.0–52.0)
Hemoglobin: 7.3 g/dL — ABNORMAL LOW (ref 13.0–17.0)
MCH: 29.2 pg (ref 26.0–34.0)
MCHC: 29.4 g/dL — ABNORMAL LOW (ref 30.0–36.0)
MCV: 99.2 fL (ref 80.0–100.0)
NRBC: 0 % (ref 0.0–0.2)
Platelets: 421 10*3/uL — ABNORMAL HIGH (ref 150–400)
RBC: 2.5 MIL/uL — ABNORMAL LOW (ref 4.22–5.81)
RDW: 16.3 % — ABNORMAL HIGH (ref 11.5–15.5)
WBC: 17.3 10*3/uL — ABNORMAL HIGH (ref 4.0–10.5)

## 2018-09-23 LAB — TROPONIN I
Troponin I: 0.25 ng/mL (ref <0.03)
Troponin I: 0.26 ng/mL (ref <0.03)
Troponin I: 0.2 ng/mL (ref <0.03)
Troponin I: 0.18 ng/mL (ref <0.03)

## 2018-09-23 MED ORDER — DIPHENHYDRAMINE HCL 50 MG/ML IJ SOLN
50.0000 mg | Freq: Once | INTRAMUSCULAR | Status: AC
  Administered 2018-09-23: 50 mg via INTRAVENOUS
  Filled 2018-09-23: qty 1

## 2018-09-23 MED ORDER — GABAPENTIN 100 MG PO CAPS
100.0000 mg | ORAL_CAPSULE | Freq: Three times a day (TID) | ORAL | Status: DC
  Administered 2018-09-23 – 2018-09-25 (×4): 100 mg via ORAL
  Filled 2018-09-23 (×5): qty 1

## 2018-09-23 MED ORDER — CINACALCET HCL 30 MG PO TABS
60.0000 mg | ORAL_TABLET | ORAL | Status: DC
  Administered 2018-09-24 – 2018-09-26 (×2): 60 mg via ORAL
  Filled 2018-09-23 (×2): qty 2

## 2018-09-23 MED ORDER — EPOETIN ALFA 10000 UNIT/ML IJ SOLN
10000.0000 [IU] | INTRAMUSCULAR | Status: DC
  Administered 2018-09-24 – 2018-09-26 (×2): 10000 [IU] via INTRAVENOUS

## 2018-09-23 NOTE — Progress Notes (Signed)
[name]    MR#:  [id number]  DATE OF BIRTH:  07/11/1961  SUBJECTIVE:   Patient seen earlier this morning.  Had a fever last night.  Apparently had chest pain yesterday however patient does not report this.  This was reported by his wife.  He was found to be in atrial fibrillation with heart rates in the 120s at the time of "chest pain"   REVIEW OF SYSTEMS:    Review of Systems  Constitutional: ++ fever, NOchills weight loss HENT: Negative for ear pain, nosebleeds, congestion, facial swelling, rhinorrhea, neck pain, neck stiffness and ear discharge.   Respiratory: Negative for cough, shortness of breath, wheezing  Cardiovascular: Negative for chest pain, palpitations and leg swelling.  Gastrointestinal: Negative for heartburn, abdominal pain, vomiting, diarrhea or consitpation Genitourinary: Negative for dysuria, urgency, frequency, hematuria Musculoskeletal: Negative for back pain or joint pain Neurological: Negative for dizziness, seizures, syncope, focal weakness,  numbness and headaches.  Hematological: Does not bruise/bleed easily.  Psychiatric/Behavioral: Negative for hallucinations, confusion, dysphoric mood    Tolerating Diet: yes      DRUG ALLERGIES:  No Known Allergies  VITALS:  Blood pressure (!) 96/50, pulse 72, temperature 99.3 F (37.4 C), temperature source Oral, resp. rate 20, height 5\' 10"  (1.778 m), weight 113.4 kg, SpO2 94 %.  PHYSICAL EXAMINATION:  Constitutional: Appears well-developed and well-nourished. No distress. HENT: Normocephalic. Marland Kitchen Oropharynx is clear and moist.  Eyes: Conjunctivae and EOM are normal. PERRLA, no scleral icterus.  Neck: Normal ROM. Neck supple. No JVD. No tracheal deviation. CVSrrr S1/S2 +, no murmurs, no gallops, no carotid bruit.  Pulmonary: Effort and breath sounds normal, no stridor, rhonchi, wheezes, rales.  Abdominal: Soft. BS +,  no distension,  tenderness, rebound or guarding.  Musculoskeletal: bilateral AKA   neuro: Alert. CN 2-12 grossly intact. No focal deficits. Skin: Skin is warm and dry. No rash noted. Psychiatric: Normal mood and affect.      LABORATORY PANEL:   CBC Recent Labs  Lab 09/23/18 0318  WBC 17.3*  HGB 7.3*  HCT 24.8*  PLT 421*   ------------------------------------------------------------------------------------------------------------------  Chemistries  Recent Labs  Lab 09/23/18 0318  NA 135  K 4.7  CL 95*  CO2 29  GLUCOSE 201*  BUN 50*  CREATININE 6.05*  CALCIUM 8.5*   ------------------------------------------------------------------------------------------------------------------  Cardiac Enzymes Recent Labs  Lab 09/22/18 2215 09/23/18 0318 09/23/18 0836  TROPONINI 0.18* 0.25* 0.26*   ------------------------------------------------------------------------------------------------------------------  RADIOLOGY:  Dg Chest 2 View  Result Date: 09/21/2018 CLINICAL DATA:  Chest pain. EXAM: CHEST - 2 VIEW COMPARISON:  05/02/2016 FINDINGS: Lateral views degraded by motion and patient size. Midline trachea. Moderate cardiomegaly. Atherosclerosis in the transverse aorta. No pleural effusion or pneumothorax. No overt congestive failure. Chronic interstitial thickening may represent mild pulmonary venous congestion. No lobar consolidation. IMPRESSION: Cardiomegaly with similar pulmonary venous congestion. Aortic Atherosclerosis (ICD10-I70.0). Electronically Signed   By: Abigail Miyamoto M.D.   On: 09/21/2018 16:08   Ct Head Wo Contrast  Result Date: 09/21/2018 CLINICAL DATA:  Pt arrives via ems from [facility], pt started c/o chest pain at 1100 this am and was given tylenol, pt cont to have pain and started jerking so staff called 911, pt had a fever of 102.7 per ems, pt was given another dose of tylenol at 1400 of 650mg . 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 COMPARISON:  04/28/2016 FINDINGS: Brain: No evidence of acute infarction, hemorrhage, hydrocephalus, extra-axial collection or mass lesion/mass effect. There is ventricular and sulcal enlargement reflecting mild generalized atrophy, advanced for age. Patchy periventricular white matter hypoattenuation is also noted consistent with mild chronic microvascular ischemic change. Vascular: No hyperdense vessel or unexpected calcification. Skull: Normal. Negative for fracture or focal lesion. Sinuses/Orbits: Visualized globes and orbits are unremarkable. The visualized sinuses and mastoid air cells are clear. Other: None. IMPRESSION: 1. No acute intracranial abnormalities. 2. Atrophy advanced for age. Mild chronic microvascular ischemic change. Electronically Signed   By: Lajean Manes M.D.   On: 09/21/2018 16:57     ASSESSMENT AND PLAN:   57 year old male with end-stage renal disease on hemodialysis, peripheral vascular disease with recent AKA, diabetes  who presented to the ER due to fever.  1.  Sepsis: Patient presentedwith fever and leukocytosis. Sepsis from unclear etiology with negative chest x-ray. Right ear with otitis media Follow-up on blood culture  ID and vascular consultation appreciated Continue cefepime, Cipro OTIC eardrops for otitis media and vancomycin  2.  Acute on chronic anemia: he is s/p 1 unit PRBC Hemoglobin stable Continue ferrous sulfate Epogen if indicated during dialysis  3.  History of hypotension Continue midodrine before dialysis treatments  4.  Peripheral artery disease status post recent right above-knee amputation: Vascular surgery consultation appreciated.  Stump looks clean and intact and is not source of sepsis.   5.  Diabetes: Continue current human with ADA diet  6.  PAF: Continue Eliquis  and amiodarone Heart rate better controlled Currently in normal sinus rhythm. 7.  End-stage renal disease on hemodialysis: Continue dialysis as per schedule by nephrology  8.  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     9.  Chest pain: Cardiology consultation placed by nephrology.  Troponins are slightly elevated and this is due to poor renal clearance and demand ischemia from atrial fibrillation with RVR.  10.  Tremor: Nephrology place consultation for neurology as per the request of the family. Gabapentin dose reduced.  11.  Otitis media: Continue Cipro drops  Management plans discussed with the patient and he is in agreement.  CODE STATUS: Full  TOTAL TIME TAKING CARE OF THIS PATIENT: 24 minutes.     POSSIBLE D/C 2 days, DEPENDING ON CLINICAL CONDITION.   Jazlyn Tippens M.D on 09/23/2018 at 11:41 AM  Between 7am to 6pm - Pager - [phone] After 6pm go to [email or web address] - password [email or web address] [facility]  Office  [phone]  CC: Primary care physician; [name], MD  Note: This dictation was prepared with Dragon dictation along with smaller phrase technology. Any transcriptional errors that result from this process are unintentional.

## 2018-09-23 NOTE — Progress Notes (Signed)
PT Cancellation Note  Patient Details Name: [name] MRN: [id number] DOB: 05/29/1961   Cancelled Treatment:    Reason Eval/Treat Not Completed: Patient's level of consciousness;Medical issues which prohibited therapy  Plan was to do PT eval this AM, however before organizing with interpretor nursing related that wife is concerned about new onset b/l UE tremors along with other medical issues.  While PT was briefly in the room pt showed very little awareness and hardly kept eyes open more than fleetingly.  Wife more concerned about getting MD and nursing in to see him, she requests to hold PT eval at this time.  Discussed possibly trying PT eval later this afternoon, nursing to call PT if he becomes more appropriate.      Kreg Shropshire, DPT 09/23/2018, 10:32 AM

## 2018-09-23 NOTE — Progress Notes (Signed)
Doctor on call made aware of new troponin value of 0.25. He stated patient already on eliquis and he was not concerned over this result since patient was already coagulated on eliquis. Nothing to do at this time.

## 2018-09-23 NOTE — Progress Notes (Signed)
[facility]  ROUNDING NOTE   Subjective:   Wife at bedside.  History taken with assistance of Spanish Interpreter via video conferencing  Febrile overnight Tmax 101.3 PRBC transfusion overnight 1 unit  Hemodialysis treatment yesterday. Tolerated treatment yesterday. UF of 2.5 liters  Having episodes of atrial fibrillation and complains of chest pain  Complains of ear pain.   Objective:  Vital signs in last 24 hours:  Temp:  [97.7 F (36.5 C)-101.3 F (38.5 C)] 99.3 F (37.4 C) (12/10 0517) Pulse Rate:  [66-107] 72 (12/10 0517) Resp:  [12-25] 20 (12/10 0517) BP: (73-126)/(44-81) 96/50 (12/10 0850) SpO2:  [87 %-100 %] 94 % (12/10 0517) Weight:  [113.4 kg] 113.4 kg (12/09 1355)  Weight change: 13.7 kg Filed Weights   09/21/18 1519 09/22/18 0943 09/22/18 1355  Weight: 102.1 kg 115.8 kg 113.4 kg    Intake/Output: I/O last 3 completed shifts: In: 2394.9 [P.O.:476; I.V.:13.8; Blood:305; IV Piggyback:1600.1] Out: 2518 [Other:2518]   Intake/Output this shift:  No intake/output data recorded.  Physical Exam: General: NAD,   Head: Normocephalic, atraumatic. Moist oral mucosal membranes  Eyes: Anicteric, PERRL  Neck: Supple, trachea midline  Lungs:  Clear to auscultation  Heart: Regular rate and rhythm  Abdomen:  Soft, nontender,   Extremities:  bilateral amputations  Neurologic: Nonfocal, moving all four extremities  Skin: No lesions  Access: AVF    Basic Metabolic Panel: Recent Labs  Lab 09/21/18 1531 09/22/18 0838 09/23/18 0318  NA 132* 132* 135  K 6.3* 5.9* 4.7  CL 93* 95* 95*  CO2 27 25 29   GLUCOSE 254* 158* 201*  BUN 61* 78* 50*  CREATININE 8.07* 8.93* 6.05*  CALCIUM 8.5* 8.2* 8.5*  PHOS  --  4.2  --     Liver Function Tests: Recent Labs  Lab 09/22/18 0838  ALBUMIN 2.8*   No results for input(s): LIPASE, AMYLASE in the last 168 hours. No results for input(s): AMMONIA in the last 168 hours.  CBC: Recent Labs  Lab  09/21/18 1531 09/22/18 0838 09/22/18 2215 09/23/18 0318  WBC 28.1* 19.4*  --  17.3*  HGB 8.0* 6.8* 7.3* 7.3*  HCT 26.8* 23.3* 24.5* 24.8*  MCV 98.5 100.0  --  99.2  PLT 576* 419*  --  421*    Cardiac Enzymes: Recent Labs  Lab 09/21/18 1531 09/22/18 1502 09/22/18 2215 09/23/18 0318 09/23/18 0836  TROPONINI 0.06* 0.06* 0.18* 0.25* 0.26*    BNP: Invalid input(s): POCBNP  CBG: Recent Labs  Lab 09/22/18 0755 09/22/18 1429 09/22/18 1708 09/22/18 2224 09/23/18 0749  GLUCAP 130* 150* 207* 220* 154*    Microbiology: Results for orders placed or performed during the hospital encounter of 09/21/18  Blood Culture (routine x 2)     Status: None (Preliminary result)   Collection Time: 09/21/18  3:00 PM  Result Value Ref Range Status   Specimen Description BLOOD R AC  Final   Special Requests   Final    BOTTLES DRAWN AEROBIC AND ANAEROBIC Blood Culture results may not be optimal due to an excessive volume of blood received in culture bottles   Culture   Final    NO GROWTH 2 DAYS Performed at [facility], [address] [place], [place] [address]    Report Status PENDING  Incomplete  Blood Culture (routine x 2)     Status: None (Preliminary result)   Collection Time: 09/21/18  3:00 PM  Result Value Ref Range Status   Specimen Description BLOOD R HAND  Final   Special Requests   Final    BOTTLES DRAWN AEROBIC AND ANAEROBIC Blood Culture adequate volume   Culture   Final    NO GROWTH 2 DAYS Performed at [facility], [address] [place], [place] [address]    Report Status PENDING  Incomplete  MRSA PCR Screening     Status: None   Collection Time: 09/21/18 11:36 PM  Result Value Ref Range Status   MRSA by PCR NEGATIVE NEGATIVE Final    Comment:        The GeneXpert MRSA Assay (FDA approved for NASAL specimens only), is one component of a comprehensive MRSA colonization surveillance program. It is not intended to diagnose MRSA infection  nor to guide or monitor treatment for MRSA infections. Performed at [facility], [address] [place], [place] [address]     Coagulation Studies: No results for input(s): LABPROT, INR in the last 72 hours.  Urinalysis: No results for input(s): COLORURINE, LABSPEC, PHURINE, GLUCOSEU, HGBUR, BILIRUBINUR, KETONESUR, PROTEINUR, UROBILINOGEN, NITRITE, LEUKOCYTESUR in the last 72 hours.  Invalid input(s): APPERANCEUR    Imaging: Dg Chest 2 View  Result Date: 09/21/2018 CLINICAL DATA:  Chest pain. EXAM: CHEST - 2 VIEW COMPARISON:  05/02/2016 FINDINGS: Lateral views degraded by motion and patient size. Midline trachea. Moderate cardiomegaly. Atherosclerosis in the transverse aorta. No pleural effusion or pneumothorax. No overt congestive failure. Chronic interstitial thickening may represent mild pulmonary venous congestion. No lobar consolidation. IMPRESSION: Cardiomegaly with similar pulmonary venous congestion. Aortic Atherosclerosis (ICD10-I70.0). Electronically Signed   By: Abigail Miyamoto M.D.   On: 09/21/2018 16:08   Ct Head Wo Contrast  Result Date: 09/21/2018 CLINICAL DATA:  Pt arrives via em[facility] from [facility], pt started c/o chest pain at 1100 this am and was given tylenol, pt cont to have pain and started jerking so staff called 911, pt had a fever of 102.7 per ems, pt was given another dose of tylenol at 1400 of 650mg . 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 COMPARISON:  04/28/2016 FINDINGS: Brain: No evidence of acute infarction, hemorrhage, hydrocephalus, extra-axial collection or mass lesion/mass effect. There is ventricular and sulcal enlargement reflecting mild generalized atrophy, advanced for age. Patchy periventricular white matter hypoattenuation is also noted consistent  with mild chronic microvascular ischemic change. Vascular: No hyperdense vessel or unexpected calcification. Skull: Normal. Negative for fracture or focal lesion. Sinuses/Orbits: Visualized globes and orbits are unremarkable. The visualized sinuses and mastoid air cells are clear. Other: None. IMPRESSION: 1. No acute intracranial abnormalities. 2. Atrophy advanced for age. Mild chronic microvascular ischemic change. Electronically Signed   By: Lajean Manes M.D.   On: 09/21/2018 16:57     Medications:   . sodium chloride Stopped (09/22/18 1841)  . ceFEPime (MAXIPIME) IV Stopped (09/22/18 1840)  . vancomycin Stopped (09/22/18 1352)   . amiodarone  200 mg Oral Daily  . apixaban  5 mg Oral BID  . aspirin EC  81 mg Oral Daily  . cinacalcet  60 mg Oral Q breakfast  . ciprofloxacin-dexamethasone  4 drop Right EAR BID  . [START ON 09/24/2018] epoetin (EPOGEN/PROCRIT) injection  10,000 Units Intravenous Q M,W,F-HD  . feeding supplement (PRO-STAT SUGAR FREE 64)  30 mL Oral Daily  . ferrous sulfate  325 mg Oral TID WC  .  gabapentin  300 mg Oral TID  . glipiZIDE  10 mg Oral Q breakfast  . insulin aspart  0-15 Units Subcutaneous TID WC  . insulin aspart  0-5 Units Subcutaneous QHS  . midodrine  5 mg Oral TID WC  . multivitamin  1 tablet Oral QHS  . pantoprazole  40 mg Oral Daily  . polyethylene glycol  17 g Oral BID  . senna-docusate  1 tablet Oral BID  . sevelamer carbonate  2,400 mg Oral TID WC  . sodium chloride flush  3 mL Intravenous Q12H  . sodium polystyrene  30 g Oral Once   sodium chloride, acetaminophen, hydrALAZINE, lidocaine-prilocaine, midodrine, ondansetron **OR** ondansetron (ZOFRAN) IV, oxyCODONE, sodium chloride flush  Assessment/ Plan:  [name] is a 57 y.o. Hispanic (Spanish speaking only) malewith end stage renal disease, peripheral vascular disease, hypertension, CVA, hyperlipidemia, diabetes mellitus type II who is admitted to [facility]  EDW 106.5 kg Left AVF  1. End Stage Renal Disease:   Hemodialysis treatment yesterday. Tolerated treatment well. UF of 2.5 liters Continue MWF schedule. Dialysis for tomorrow     2. Anemia of chronic kidney disease: hemoglobin 7.3 status post PRBC transfusion on 12/9 - EPO with HD treatment  3. Hypotension - Midodrine before dialysis treatments  4. Secondary Hyperparathyroidism:  - continue Renvela and Sensipar with meals.  - Cinacalcet three times weekly.   5. Ear pain: empiric ciprofloxacin/dexamethasone otic solution.   6. Chest pain with atrial fibrillation: now with elevated cardiac enzymes - Consult cardiology  7. Tremor: with high dose gabapentin - reduce dose of gabapentin - As per family request, will consult neurology.    LOS: 2 Mateya Torti 12/10/201910:19 AM

## 2018-09-23 NOTE — Progress Notes (Signed)
MD made aware of pt.'s Troponin 0.26, no new orders given at this time. RN will continue to monitor pt closely.  [name] [employer]

## 2018-09-23 NOTE — Evaluation (Signed)
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 Patient has PMH to include: DM, PVD, L BKA. Patient has prothesis for L (not here in the hospital)  Clinical Impression  Pt initially difficult to wake (reports feeling very sleepy from meds) but was able to stay engaged t/o the session via interpreter.  He was discharged post R AKA ~2 weeks ago and has been working on some transfers, exercises and limited standing in this time.  He started having fever, increased R stump pain as well as b/l UE trembling and is back.  He showed very good effort with PT exam and was able to help a lot with bed mobility tasks, though he does need assist with all mobility and is though he does have good strength in L residual limb he is very pain limited with the R.  Pt was able to tolerate sitting at EOB for >5 minutes with no dizziness, etc though he did fatigue with this and needed to lay back down.  Pt reports he had been doing some slide board transfers at rehab but was needing lift to get to dialysis, etc.  Pt will need to return to STR once medically cleared to work back to a more functional and independent status.    Follow Up Recommendations SNF    Equipment Recommendations  (per continued work at rehab, will eventually need prosthetic)    Recommendations for Other Services       Precautions / Restrictions Precautions Precautions: Fall Restrictions Weight Bearing Restrictions: (R AKA <12 weeks old)      Mobility  Bed Mobility Overal bed mobility: Needs Assistance Bed Mobility: Supine to Sit;Sit to Supine     Supine to sit: Mod assist Sit to supine: Min assist   General bed mobility comments: Pt showed good ability to get to sidelying and with scooting in bed, but did need assist getting to/from  sitting  Transfers                 General transfer comment: deferred this date  Ambulation/Gait                Stairs            Wheelchair Mobility    Modified Rankin (Stroke Patients Only)       Balance Overall balance assessment: Needs assistance Sitting-balance support: Single extremity supported Sitting balance-Leahy Scale: Good Sitting balance - Comments: once assisted to upright seated position patient able to maintain sitting balance with single UE support.                                      Pertinent Vitals/Pain Pain Assessment: 0-10 Pain Score: 7  Pain Location: Pt having increased pain in R residual limb, especially with any palpation/pressure through bed    Home Living Family/patient expects to be discharged to:: Skilled nursing facility Living Arrangements: Spouse/significant other Available Help at Discharge: Family Type of Home: Mobile home Home Access: Ramped entrance     Home Layout: One level Home Equipment: Walker - 4 wheels;Wheelchair - manual      Prior Function Level of Independence: Independent with assistive device(s)         Comments: Prior to issues  related to R LE (and subsequent AKA) he was ambulating with walker and prosthetic     Hand Dominance        Extremity/Trunk Assessment   Upper Extremity Assessment Upper Extremity Assessment: (R UE grossly 3+ to 4-/5, L UE grossly 4- to 4/5)    Lower Extremity Assessment Lower Extremity Assessment: (R LE very pain limited, has AROM t/o, L LE 4/5 t/o )       Communication   Communication: Interpreter utilized;Prefers language other than English(Jacqui L)  Cognition Arousal/Alertness: Lethargic(though able to stay engaged t/o most of exam) Behavior During Therapy: WFL for tasks assessed/performed Overall Cognitive Status: Within Functional Limits for tasks assessed                                        General Comments       Exercises     Assessment/Plan    PT Assessment Patient needs continued PT services  PT Problem List Decreased balance;Decreased mobility;Decreased strength       PT Treatment Interventions Therapeutic exercise;Patient/family education;Therapeutic activities;Functional mobility training    PT Goals (Current goals can be found in the Care Plan section)  Acute Rehab PT Goals Patient Stated Goal: to eventually return home PT Goal Formulation: With patient/family Time For Goal Achievement: 10/07/18 Potential to Achieve Goals: Fair    Frequency Min 2X/week   Barriers to discharge        Co-evaluation               AM-PAC PT "6 Clicks" Mobility  Outcome Measure Help needed turning from your back to your side while in a flat bed without using bedrails?: None Help needed moving from lying on your back to sitting on the side of a flat bed without using bedrails?: A Lot Help needed moving to and from a bed to a chair (including a wheelchair)?: Total Help needed standing up from a chair using your arms (e.g., wheelchair or bedside chair)?: Total Help needed to walk in hospital room?: Total Help needed climbing 3-5 steps with a railing? : Total 6 Click Score: 10    End of Session   Activity Tolerance: Patient limited by fatigue;Patient limited by lethargy;Patient tolerated treatment well Patient left: in bed;with bed alarm set;with family/visitor present;with call bell/phone within reach   PT Visit Diagnosis: Muscle weakness (generalized) (M62.81);Other abnormalities of gait and mobility (R26.89)    Time: 8638-1771 PT Time Calculation (min) (ACUTE ONLY): 32 min   Charges:   PT Evaluation $PT Eval Moderate Complexity: 1 Mod          Kreg Shropshire, DPT 09/23/2018, 5:49 PM

## 2018-09-23 NOTE — Clinical Social Work Note (Signed)
CSW consulted for nursing home placement. PT has not yet been able to evaluate patient at this time. Please re-consult once PT has evaluated. Shela Leff MSW,LCSW [phone]

## 2018-09-23 NOTE — Care Management (Signed)
Amanda Morris dialysis liaison notified of admission.    

## 2018-09-23 NOTE — Consult Note (Signed)
Cardiology Consultation:   Patient ID: [name] MRN: [id number]; DOB: 1961/05/24  Admit date: 09/21/2018 Date of Consult: 09/23/2018  Primary Care Provider: [name], MD Primary Cardiologist: Kathlyn Sacramento, MD  Primary Electrophysiologist:  None    Patient Profile:   [name] is a 57 y.o. male with a hx of 1v CAD (2017 [place]), HFrEF (EF 30-35%, 2017 LHC), h/o polymorphic VT & cardiac arrest 2017, CVA, ESRD on HD since 2008, HTN with labile pressures, DM2, HLD, PAD with L BKA / R AKA, stroke, anemia of CKD  who is being seen today for the evaluation of new onset Afib with RVR and CP on HD at the request of Dr. Juleen China.  History of Present Illness:   [name] is a 57 year old male with complex PMH as above and recent admission with new onset Afib with RVR and chest pain in November with discharge 09/15/2018.  --04/2016: Admitted to [facility] 04/25/2016 for elective surgery with R toe amputation planned and excisional debridement of necrotic tissue / gangrene 2/2 atherosclerotic occlusive disease of the bilateral lower extremities.  He then had an angiogram with angioplasty and developed polymorphic ventricular tachycardia which required 1 shock to restore sinus rhythm and CPR for 2 minutes. Repeat EKG showed SR with borderline ST depression in the inferior leads but no significant STE. Echocardiogram showed EF of 40-45%. It was suspected that the most likely etiology of the ventricular tachycardia was an ischemic cardiac event given his history of atherosclerosis and multiple risk factors.  He was started on ASA, statin, low-dose metoprolol and on 04/30/2016, he underwent cardiac catheterization with LHC and coronary angiography and found to have severe 1v CAD with chronically occluded proximal RCA with L-R collateral, moderate LV systolic dysfunction given EF 30-35%, global hypokinesis more pronounced in the basal inferior wall, and moderately elevated LVEDP. Recommendations were  to continue medical management, monitor BP during HD, and for a holter d/t PVCs as well as consideration for EP evaluation to see if ICD candidate.  --08/13/2018: He underwent an attempt for RLE arterial revascularization via balloon angioplasty and thrombectomy given the progression of his atherosclerotic occlusive disease with non-healing RLE ulceration and continued poor arterial perfusion. Per patient, following this procedure, the patient's RLE circulation initially improved with more warmth and less pain in the leg; however, shortly thereafter this R foot and leg were again cold to the touch with blue/black discoloration after dialysis that same day. He was sen in the office 11/11 by podiatry and felt to be doing well, stable, and without any open wounds.  --09/03/2018: Presented to [facility] ED and admitted with R foot and leg pain concerning for ischemic lower right extremity. On physical exam, the patient's RLE was documented as pulseless, cold, and mottled in color from proximal calf to foot with a black, gangrenous area ~2-3cm width on the posterior R calf. The case was discussed with Dr. Nino Parsley APP with plan for further evaluation via vascular service. He was then placed on heparin and admitted for further evaluation and R AKA performed 09/09/2018. On 11/27, rapid response was called d/t new onset Afib with RVR while in HD. Patient denied CP, SOB, and palpitations at that time. [facility] cardiology was then consulted for new onset Afib. He was discharged 09/15/2018 with PTA medications as of 09/22/2018 including amiodarone 200 mg daily, Eliquis 5 mg twice daily, ASA 81 mg daily, and supplementation, Lopressor 25 mg twice daily, midodrine 5 mg 3 times daily --09/14/2018: Seen by Dr. Acie Fredrickson  with documentation noting patient in SR. Plan for continued medical management. -----  Most Recent Admission:  09/21/2018: Patient reported chest pain that started Sunday 12/8 and was associated with bilateral upper  extremity numbness and tremors in his arms and legs, as well as bilateral upper extremity weakness. He also noted a fever and bleeding from his ear, as well as facial numbness. He stated that this chest pain was the reason for his presenting to [facility] ED.    In the ED, he was found to be septic, febrile, tachycardic, and hypertensive. He was also hyperkalemic with Cr 8.07 on HD. Vitals: BP 90/37, HR 74, T1 100 F, RR 16, SPO2 97% Labs: WBC 28.1, Hgb 8.0, HCT 26.8, PLT 576, NA 132, K 6.3, glucose 254, Cr 8.07, BUN 61, Ca 8.5 Troponin 0 0.06 EKG: Atrial fibrillation with rapid ventricular response, ventricular rate 123 bpm with premature ventricular or aberrantly conducted complexes, low voltage QRS, CAD incomplete left bundle branch block, nonspecific ST-T wave abnormality CXR: Cardiomegaly with similar pulmonary venous congestion, aortic atherosclerosis CT head: No acute intracranial abnormalities.  Atrophy advanced for age.  Mild chronic microvascular ischemic change. Meds: Started on abx and admitted for further evaluation and management of his acute sepsis secondary to unknown etiology. Of note, patient continues to be on HD with last HD visit Monday (yesterday) 09/22/2018.  09/15/2018: Cardiology consulted due to 09/22/2018 with Afib and ventricular rates into the 120s. On review of telemetry, patient converted to [place] and was in SR at the time of today's exam.  Past Medical History:  Diagnosis Date  . Anemia of chronic disease   . Atherosclerosis   . Atrial fibrillation with rapid ventricular response (HCC)    chadsvasc at least 6; new onset versus paroxysmal with first or most recent episode on 09/10/18  . CAD (coronary artery disease)    2017 LHC, severe 1v disease, occluded pRCA, L-R collaterals, moderate LV dysfunction EF 30-35  . Diabetes mellitus with complication ([place])   . End stage renal disease on dialysis ([place])    Labile BP  . History of ventricular tachycardia    2017 polymorphic VT  and cardiac arrest s/p angioplasty   . Hypercholesteremia   . Hyperlipidemia LDL goal <70   . Hypertension   . Labile blood pressure    on HD  . NSVT (nonsustained ventricular tachycardia) ([place])    08/2018 NSVT  . Peripheral arterial occlusive disease (HCC)    s/p L BKA, R AKA  . S/P AKA (above knee amputation) unilateral, right ([place])    08/2018  . S/P BKA (below knee amputation) unilateral, left ([place])    2015  . Stroke ([place])   . Systolic heart failure ([place])    2017 EF per LHC 30-35%    Past Surgical History:  Procedure Laterality Date  . A/V FISTULAGRAM Left 01/09/2017   Procedure: A/V Fistulagram;  Surgeon: [name], MD;  Location: [place] [facility];  Service: Cardiovascular;  Laterality: Left;  . A/V FISTULAGRAM Left 06/06/2017   Procedure: A/V Fistulagram;  Surgeon: [name], MD;  Location: [place] CV [facility];  Service: Cardiovascular;  Laterality: Left;  . A/V FISTULAGRAM Left 08/15/2017   Procedure: A/V Fistulagram;  Surgeon: [name], MD;  Location: [place] [facility];  Service: Cardiovascular;  Laterality: Left;  . A/V FISTULAGRAM Left 01/22/2018   Procedure: A/V FISTULAGRAM;  Surgeon: [name], MD;  Location: [place] [facility];  Service: Cardiovascular;  Laterality: Left;  .  A/V SHUNT INTERVENTION N/A 06/06/2017   Procedure: A/V SHUNT INTERVENTION;  Surgeon: [name], MD;  Location: [employer] [facility];  Service: Cardiovascular;  Laterality: N/A;  . AMPUTATION Right 04/25/2016   Procedure: TOE AMPUTATION;  Surgeon: Katha Cabal, MD;  Location: [facility] OR[facility];  Service: Vascular;  Laterality: Right;  . AMPUTATION Right 09/09/2018   Procedure: AMPUTATION ABOVE KNEE;  Surgeon: [name], MD;  Location: [facility];  Service: Vascular;  Laterality: Right;  . APPLICATION OF WOUND VAC Right 04/25/2016   Procedure: APPLICATION OF WOUND VAC;  Surgeon: Katha Cabal, MD;  Location: [facility];  Service: Vascular;  Laterality: Right;  . AV FISTULA PLACEMENT  Left 2015  . CARDIAC CATHETERIZATION N/A 04/30/2016   Procedure: Left Heart Cath and Coronary Angiography;  Surgeon: Wellington Hampshire, MD;  Location: [place] CV LAB;  Service: Cardiovascular;  Laterality: N/A;  . CATARACT EXTRACTION, BILATERAL    . LEG AMPUTATION BELOW KNEE Left   . LOWER EXTREMITY ANGIOGRAPHY Right 07/24/2018   Procedure: LOWER EXTREMITY ANGIOGRAPHY;  Surgeon: [name], MD;  Location: [place] [facility];  Service: Cardiovascular;  Laterality: Right;  . LOWER EXTREMITY ANGIOGRAPHY Right 08/13/2018   Procedure: LOWER EXTREMITY ANGIOGRAPHY;  Surgeon: [name], MD;  Location: [place] [facility];  Service: Cardiovascular;  Laterality: Right;  . LOWER EXTREMITY ANGIOGRAPHY Right 09/04/2018   Procedure: Lower Extremity Angiography;  Surgeon: [name], MD;  Location: [place] [facility];  Service: Cardiovascular;  Laterality: Right;  . PERIPHERAL VASCULAR CATHETERIZATION N/A 10/03/2015   Procedure: A/V Shuntogram/Fistulagram;  Surgeon: [name], MD;  Location: [place] [facility];  Service: Cardiovascular;  Laterality: N/A;  . PERIPHERAL VASCULAR CATHETERIZATION N/A 01/16/2016   Procedure: Abdominal Aortogram w/Lower Extremity;  Surgeon: [name], MD;  Location: [place] [facility];  Service: Cardiovascular;  Laterality: N/A;  . PERIPHERAL VASCULAR CATHETERIZATION  01/16/2016   Procedure: Lower Extremity Intervention;  Surgeon: [name], MD;  Location: [place] [facility];  Service: Cardiovascular;;  . PERIPHERAL VASCULAR CATHETERIZATION Right 04/27/2016   Procedure: Lower Extremity Angiography;  Surgeon: Katha Cabal, MD;  Location: [place] [facility];  Service: Cardiovascular;  Laterality: Right;  . PERIPHERAL VASCULAR CATHETERIZATION  04/27/2016   Procedure: Lower Extremity Intervention;  Surgeon: Katha Cabal, MD;  Location: [place] [facility];  Service: Cardiovascular;;  . TRANSMETATARSAL AMPUTATION Right 04/28/2016   Procedure:  TRANSMETATARSAL AMPUTATION;  Surgeon: Albertine Patricia, DPM;  Location: [facility];  Service: Podiatry;  Laterality: Right;  . WOUND DEBRIDEMENT Right 04/25/2016   Procedure: DEBRIDEMENT WOUND;  Surgeon: Katha Cabal, MD;  Location: [facility];  Service: Vascular;  Laterality: Right;     Home Medications:  Prior to Admission medications   Medication Sig Start Date End Date Taking? Authorizing Provider  acetaminophen (TYLENOL) 325 MG tablet Take 650 mg by mouth every 6 (six) hours as needed.   Yes [provider]  Amino Acids-Protein Hydrolys (FEEDING SUPPLEMENT, PRO-STAT SUGAR FREE 64,) LIQD Take 30 mLs by mouth daily.   Yes [provider]  amiodarone (PACERONE) 200 MG tablet Take 1 tablet (200 mg total) by mouth daily. 09/15/18  Yes Sudini, Alveta Heimlich, MD  apixaban (ELIQUIS) 5 MG TABS tablet Take 1 tablet (5 mg total) by mouth 2 (two) times daily. 09/15/18  Yes Hillary Bow, MD  aspirin EC 81 MG tablet Take 81 mg by mouth daily.   Yes [provider]  b complex-vitamin  c-folic acid (NEPHRO-VITE) 0.8 MG TABS tablet Take 1 tablet by mouth daily.   Yes [provider]  cinacalcet (SENSIPAR) 60 MG tablet Take 60 mg by mouth daily.    Yes [provider]  ferrous sulfate 325 (65 FE) MG EC tablet Take 325 mg by mouth 3 (three) times daily with meals.   Yes [provider]  gabapentin (NEURONTIN) 300 MG capsule Take 1 capsule (300 mg total) by mouth 3 (three) times daily. 09/18/18  Yes Kris Hartmann, NP  glipiZIDE (GLUCOTROL) 10 MG tablet Take 10 mg by mouth daily.  10/25/16  Yes [provider]  lidocaine-prilocaine (EMLA) cream Apply 1 application topically as needed (port access).   Yes [provider]  metoprolol tartrate (LOPRESSOR) 25 MG tablet Take 1 tablet (25 mg total) by mouth 2 (two) times daily. 09/15/18  Yes Sudini, Alveta Heimlich, MD  midodrine (PROAMATINE) 5 MG tablet Take 1 tablet (5 mg total) by mouth 3 (three) times daily with  meals. 09/15/18  Yes Sudini, Alveta Heimlich, MD  omeprazole (PRILOSEC) 40 MG capsule Take 40 mg by mouth 2 (two) times daily.    Yes [provider]  oxyCODONE (OXY IR/ROXICODONE) 5 MG immediate release tablet Take 1 tablet (5 mg total) by mouth every 6 (six) hours as needed for moderate pain or severe pain. 09/15/18  Yes Sudini, Alveta Heimlich, MD  polyethylene glycol (MIRALAX / GLYCOLAX) packet Take 17 g by mouth 2 (two) times daily.    Yes [provider]  senna-docusate (SENOKOT-S) 8.6-50 MG tablet Take 1 tablet by mouth 2 (two) times daily.   Yes [provider]  sevelamer carbonate (RENVELA) 800 MG tablet Take 2,400 mg by mouth 3 (three) times daily with meals.    Yes [provider]    Inpatient Medications: Scheduled Meds: . amiodarone  200 mg Oral Daily  . apixaban  5 mg Oral BID  . aspirin EC  81 mg Oral Daily  . [START ON 09/24/2018] cinacalcet  60 mg Oral Once per day on Mon Wed Fri  . ciprofloxacin-dexamethasone  4 drop Right EAR BID  . [START ON 09/24/2018] epoetin (EPOGEN/PROCRIT) injection  10,000 Units Intravenous Q M,W,F-HD  . feeding supplement (PRO-STAT SUGAR FREE 64)  30 mL Oral Daily  . ferrous sulfate  325 mg Oral TID WC  . gabapentin  100 mg Oral TID  . glipiZIDE  10 mg Oral Q breakfast  . insulin aspart  0-15 Units Subcutaneous TID WC  . insulin aspart  0-5 Units Subcutaneous QHS  . midodrine  5 mg Oral TID WC  . multivitamin  1 tablet Oral QHS  . pantoprazole  40 mg Oral Daily  . polyethylene glycol  17 g Oral BID  . senna-docusate  1 tablet Oral BID  . sevelamer carbonate  2,400 mg Oral TID WC  . sodium chloride flush  3 mL Intravenous Q12H  . sodium polystyrene  30 g Oral Once   Continuous Infusions: . sodium chloride Stopped (09/22/18 1841)  . ceFEPime (MAXIPIME) IV Stopped (09/22/18 1840)  . vancomycin Stopped (09/22/18 1352)   PRN Meds: sodium chloride, acetaminophen, hydrALAZINE, lidocaine-prilocaine, midodrine, ondansetron **OR**  ondansetron (ZOFRAN) IV, oxyCODONE, sodium chloride flush  Allergies:   No Known Allergies  Social History:   Social History   Socioeconomic History  . Marital status: Married    Spouse name: Not on file  . Number of children: Not on file  . Years of education: Not on file  . Highest education  level: Not on file  Occupational History  . Not on file  Social Needs  . Financial resource strain: Not on file  . Food insecurity:    Worry: Patient refused    Inability: Patient refused  . Transportation needs:    Medical: No    Non-medical: No  Tobacco Use  . Smoking status: Never Smoker  . Smokeless tobacco: Never Used  Substance and Sexual Activity  . Alcohol use: No  . Drug use: No  . Sexual activity: Not on file  Lifestyle  . Physical activity:    Days per week: Not on file    Minutes per session: Not on file  . Stress: Not at all  Relationships  . Social connections:    Talks on phone: More than three times a week    Gets together: Three times a week    Attends religious service: Not on file    Active member of club or organization: Not on file    Attends meetings of clubs or organizations: Not on file    Relationship status: Not on file  . Intimate partner violence:    Fear of current or ex partner: No    Emotionally abused: Not on file    Physically abused: Not on file    Forced sexual activity: Not on file  Other Topics Concern  . Not on file  Social History Narrative  . Not on file    Family History:    Family History  Problem Relation Age of Onset  . Diabetes Father   . Prostate cancer Father      ROS:  Please see the history of present illness.  Review of Systems  Constitutional: Positive for chills, fever and malaise/fatigue.  HENT: Positive for ear discharge and ear pain.        Reported ear bleeding, not currently present on exam  Cardiovascular: Positive for chest pain. Negative for palpitations.       Chest pain Sunday. No current chest pain  on exam  Bilateral amputations of lower extremity - L BKA / R AKA  Neurological: Positive for dizziness, tingling, tremors, sensory change, focal weakness, weakness and headaches.  Psychiatric/Behavioral: The patient is nervous/anxious.    Feels as if his heart is racing. Denies palpitations. All other ROS reviewed and negative.     Physical Exam/Data:   Vitals:   09/23/18 0224 09/23/18 0517 09/23/18 0850 09/23/18 1120  BP:  (!) 96/53 (!) 96/50 (!) 97/54  Pulse:  72  77  Resp:  20  19  Temp: 99.8 F (37.7 C) 99.3 F (37.4 C)  98.2 F (36.8 C)  TempSrc: Oral Oral  Oral  SpO2:  94%  95%  Weight:      Height:        Intake/Output Summary (Last 24 hours) at 09/23/2018 1225 Last data filed at 09/23/2018 0300 Gross per 24 hour  Intake 645.26 ml  Output 2518 ml  Net -1872.74 ml   Filed Weights   09/21/18 1519 09/22/18 0943 09/22/18 1355  Weight: 102.1 kg 115.8 kg 113.4 kg   Body mass index is 35.87 kg/m.  General:  Well nourished, well developed, in no acute distress. Accompanied by wife, service dog, additional family member HEENT: normal. No blood around R ear of note Neck: no significant JVD Vascular: No carotid bruits; L BKA / R AKA Cardiac:  normal S1, S2; RRR; no murmur (converted back to SR) Lungs:  clear to auscultation bilaterally, no wheezing, rhonchi or  rales  Abd: soft, nontender, no hepatomegaly  Ext: L BKA / R AKA and without edema or signs of infection on exam Musculoskeletal:  No deformities, BUE and BLE strength normal and equal Skin: warm and dry  Neuro:  no focal abnormalities noted Psych:  Normal affect   EKG:  The EKG was personally reviewed and demonstrates:  No new tracings. Previous tracings showed Afib with RVR as above. Telemetry:  Telemetry was personally reviewed and demonstrates:  SR since 4:15PM on 09/22/18  Relevant CV Studies: 09/10/2018 TTE Study Conclusions - Procedure narrative: Transthoracic echocardiography for left   ventricular  function evaluation, for right ventricular function   evaluation, and for assessment of valvular function. Image   quality was suboptimal. The study was technically difficult. - Left ventricle: The cavity size was normal. Wall thickness was   increased in a pattern of severe LVH. Systolic function was   vigorous. The estimated ejection fraction was in the range of 65%   to 70%, though atrial fibrillation with rapid ventricular   response limits evaluation. - Mitral valve: Calcified annulus. - Left atrium: The atrium was mildly dilated. - Right ventricle: The cavity size was normal. Systolic function   was normal.   LHC 04/30/2016  Ost LM to LM lesion, 10% stenosed.  Ost RCA to Prox RCA lesion, 100% stenosed.  There is moderate left ventricular systolic dysfunction.  1. Severe one-vessel coronary artery disease with chronically occluded proximal right coronary artery with left to right collaterals. Minimal irregularities affecting the left coronary arteries. 2. Moderately reduced LV systolic function with an ejection fraction of 30-35% with global hypokinesis more pronounced in the basal inferior wall. 3. Moderately elevated left ventricular end-diastolic pressure. Recommendations: Occluded right coronary artery appears to be chronic with left to right collaterals. Recommend medical therapy for coronary artery disease and systolic heart failure. Blood pressure during dialysis is going to be a limiting factor. Continue metoprolol. The patient does have frequent PVCs. Recommend an outpatient Holter monitor to quantify the PVCs which might be contributing to his degree of cardiomyopathy. EP evaluation at some point to see if he is a candidate for ICD placement.     Laboratory Data:  Chemistry Recent Labs  Lab 09/21/18 1531 09/22/18 0838 09/23/18 0318  NA 132* 132* 135  K 6.3* 5.9* 4.7  CL 93* 95* 95*  CO2 27 25 29   GLUCOSE 254* 158* 201*  BUN 61* 78* 50*  CREATININE 8.07*  8.93* 6.05*  CALCIUM 8.5* 8.2* 8.5*  GFRNONAA 7* 6* 9*  GFRAA 8* 7* 11*  ANIONGAP 12 12 11     Recent Labs  Lab 09/22/18 0838  ALBUMIN 2.8*   Hematology Recent Labs  Lab 09/21/18 1531 09/22/18 0838 09/22/18 2215 09/23/18 0318  WBC 28.1* 19.4*  --  17.3*  RBC 2.72* 2.33*  --  2.50*  HGB 8.0* 6.8* 7.3* 7.3*  HCT 26.8* 23.3* 24.5* 24.8*  MCV 98.5 100.0  --  99.2  MCH 29.4 29.2  --  29.2  MCHC 29.9* 29.2*  --  29.4*  RDW 16.1* 16.3*  --  16.3*  PLT 576* 419*  --  421*   Cardiac Enzymes Recent Labs  Lab 09/21/18 1531 09/22/18 1502 09/22/18 2215 09/23/18 0318 09/23/18 0836  TROPONINI 0.06* 0.06* 0.18* 0.25* 0.26*   No results for input(s): TROPIPOC in the last 168 hours.  BNPNo results for input(s): BNP, PROBNP in the last 168 hours.  DDimer No results for input(s): DDIMER in the last 168 hours.  Radiology/Studies:  Dg Chest 2 View  Result Date: 09/21/2018 CLINICAL DATA:  Chest pain. EXAM: CHEST - 2 VIEW COMPARISON:  05/02/2016 FINDINGS: Lateral views degraded by motion and patient size. Midline trachea. Moderate cardiomegaly. Atherosclerosis in the transverse aorta. No pleural effusion or pneumothorax. No overt congestive failure. Chronic interstitial thickening may represent mild pulmonary venous congestion. No lobar consolidation. IMPRESSION: Cardiomegaly with similar pulmonary venous congestion. Aortic Atherosclerosis (ICD10-I70.0). Electronically Signed   By: Abigail Miyamoto M.D.   On: 09/21/2018 16:08   Ct Head Wo Contrast  Result Date: 09/21/2018 CLINICAL DATA:  Pt arrives via ems from [facility], pt started c/o chest pain at 1100 this am and was given tylenol, pt cont to have pain and started jerking so staff called 911, pt had a fever of 102.7 per ems, pt was given another dose of tylenol at 1400 of 650mg . Pt had a recent bka on the right side and pt also was diagnosed with MI early dec EXAM: CT HEAD WITHOUT CONTRAST TECHNIQUE: Contiguous axial images were obtained  from the base of the skull through the vertex without intravenous contrast. COMPARISON:  04/28/2016 FINDINGS: Brain: No evidence of acute infarction, hemorrhage, hydrocephalus, extra-axial collection or mass lesion/mass effect. There is ventricular and sulcal enlargement reflecting mild generalized atrophy, advanced for age. Patchy periventricular white matter hypoattenuation is also noted consistent with mild chronic microvascular ischemic change. Vascular: No hyperdense vessel or unexpected calcification. Skull: Normal. Negative for fracture or focal lesion. Sinuses/Orbits: Visualized globes and orbits are unremarkable. The visualized sinuses and mastoid air cells are clear. Other: None. IMPRESSION: 1. No acute intracranial abnormalities. 2. Atrophy advanced for age. Mild chronic microvascular ischemic change. Electronically Signed   By: Lajean Manes M.D.   On: 09/21/2018 16:57    Assessment and Plan:   Afib with RVR - Currently SR. Telemetry shows Afib with conversion yesterday 12/9 around 4:15PM. H/o past 2017 polymorphic VT as above in HPI; newly dx paroxysmal Afib with RVR as of November 2019 admission.  - Bilateral upper extremity numbness, facial numbness workup per IM. Not likely cardiac etiology. CT head scan as above without acute changes. H/o stroke but on anticoagulation since last admission given CHA2DS2VASc score of at least 6 (HFrEF, HTN, DM2, CVAx2, PAD) with 9.7% stroke risk per year.  --Most recent labs show K 4.7 (on HD), Cr 6.05 and improved from initial Cr of 8.93 at admission. - Continue medical management with Eliquis 5mg  a bid, amiodarone 200mg  BID. Holding BB d/t hypotension - Consider repeat EKG as converted to SR and reported CP at admission with minimally elevated troponin as below in the setting of sepsis and likely d/t supply demand ischemia. - Will need outpatient follow-up  Sepsis - Likely troponin elevation in the setting of sepsis --Unknown etiology of  infection --Per IM, ID  CAD with known severe 1v disease s/p LHC 2017 - No current CP. EKGs as above and now NSR. Known occluded RCA with L-R collaterals.  - HD as limiting factor for medical management.  - 11/27 LDL 68 and at goal <70.   - Labs show minimally elevated troponin at 0.06  0.18  0.25  0.26. Continue to cycle with consideration of demand ischemia etiology in the setting of rapid ventricular rates, sepsis, s/p on HD.  --No indication / no recommendation for urgent ischemic evaluation with cardiac catheterization unless subsequent troponin labs show bump / chest pain returns.  --Continue medical management with asa. Holding BB d/t hypotension. SL nitro if CP  returns --Will continue to monitor and plan for outpatient follow-up after discharge. Reasonable to consider outpatient cardiac catheterization in the future and once recovered from infection with source identified given episodes of chest pain, multiple risk factors, and known CAD. At this time, discovery of infection etiology and recovery are prime concern.  HFrEF with 2017 LHC showing EF 30-35% - 2017 TTE and catheterization showed EF reduced with LHC EF 30-35%. 08/2018 echo showed normal LV systolic function.  --Would not recommend lasix and hydralazine at this time given hypotension and sepsis - Continue medical management with BP during HD given sepsis0 a limiting factor as noted above.   ARF on HD with anemia of chronic disease - Continue to monitor vitals. Daily BMET, on HD. - Daily CBC and BMET to monitor Cr, electrolytes, and Hgb with known anemia of chronic disease and on heparin. Hgb 7.3.  --Continue Fe supplementation.  - Further management per nephrology  HTN - Hypotensive, septic - On midodrine. Continue as needed. Holding BB.  - Monitor vitals  DM2 - SSI - Per IM  PVD - Bilateral amputations as above - Followed by vascular surgery   For questions or updates, please contact [facility] Please  consult [email or web address] for contact info under     Signed, Arvil Chaco, PA-C  09/23/2018 12:25 PM

## 2018-09-23 NOTE — Progress Notes (Signed)
[place] INFECTIOUS DISEASE PROGRESS NOTE Date of Admission:  09/21/2018     ID: DAYNE CHAIT is a 57 y.o. male with fevers, ESRD, anemia s/p transfusion, recent BKA Active Problems:   Sepsis ([place])   Subjective: Seen with spanish interpreter Febrile again last night but was getting blood. Denies CP, cough, abd pain, nausea vomiting, skin lesions.  He still has some R ear pain  ROS  Eleven systems are reviewed and negative except per hpi  Medications:  Antibiotics Given (last 72 hours)    Date/Time Action Medication Dose Rate   09/21/18 1714 New Bag/Given   ceFEPIme (MAXIPIME) 2 g in sodium chloride 0.9 % 100 mL IVPB 2 g 200 mL/hr   09/21/18 1803 New Bag/Given   vancomycin (VANCOCIN) IVPB 1000 mg/200 mL premix 1,000 mg 200 mL/hr   09/21/18 2303 New Bag/Given   vancomycin (VANCOCIN) IVPB 1000 mg/200 mL premix 1,000 mg 200 mL/hr   09/22/18 1252 New Bag/Given   vancomycin (VANCOCIN) IVPB 1000 mg/200 mL premix 1,000 mg 200 mL/hr   09/22/18 1807 New Bag/Given   ceFEPIme (MAXIPIME) 1 g in sodium chloride 0.9 % 100 mL IVPB 1 g 200 mL/hr     . amiodarone  200 mg Oral Daily  . apixaban  5 mg Oral BID  . aspirin EC  81 mg Oral Daily  . [START ON 09/24/2018] cinacalcet  60 mg Oral Once per day on Mon Wed Fri  . ciprofloxacin-dexamethasone  4 drop Right EAR BID  . [START ON 09/24/2018] epoetin (EPOGEN/PROCRIT) injection  10,000 Units Intravenous Q M,W,F-HD  . feeding supplement (PRO-STAT SUGAR FREE 64)  30 mL Oral Daily  . ferrous sulfate  325 mg Oral TID WC  . gabapentin  100 mg Oral TID  . glipiZIDE  10 mg Oral Q breakfast  . insulin aspart  0-15 Units Subcutaneous TID WC  . insulin aspart  0-5 Units Subcutaneous QHS  . midodrine  5 mg Oral TID WC  . multivitamin  1 tablet Oral QHS  . pantoprazole  40 mg Oral Daily  . polyethylene glycol  17 g Oral BID  . senna-docusate  1 tablet Oral BID  . sevelamer carbonate  2,400 mg Oral TID WC  . sodium chloride flush  3 mL  Intravenous Q12H  . sodium polystyrene  30 g Oral Once    Objective: Vital signs in last 24 hours: Temp:  [97.7 F (36.5 C)-101.3 F (38.5 C)] 99.3 F (37.4 C) (12/10 0517) Pulse Rate:  [66-107] 72 (12/10 0517) Resp:  [12-25] 20 (12/10 0517) BP: (73-126)/(44-81) 96/50 (12/10 0850) SpO2:  [87 %-100 %] 94 % (12/10 0517) Weight:  [113.4 kg] 113.4 kg (12/09 1355) Constitutional: obese, lying in bed, alert HENT: anicteric, R ear with some moist fluid in ear canal but TM appears intact without redness or fluid Mouth/Throat: Oropharynx is clear and moist. No oropharyngeal exudate.  Cardiovascular: tachy irregular Pulmonary/Chest: Effort normal and breath sounds normal. No respiratory distress. He has no wheezes.  Abdominal: obese Soft. Bowel sounds are normal. He exhibits no distension. There is no tenderness.  Lymphadenopathy: He has no cervical adenopathy.  Neurological: He is alert and oriented to person, place, and time.  Skin: Skin is warm and dry. No rash noted. No erythema.  R BKA site has very well approximated incision site with no drainage, redness or tenderness L BKA site healed over Psychiatric: He has a normal mood and affect. His behavior is normal.  AVF LUE wnl, non tender  Lab Results Recent Labs    09/22/18 0838 09/22/18 2215 09/23/18 0318  WBC 19.4*  --  17.3*  HGB 6.8* 7.3* 7.3*  HCT 23.3* 24.5* 24.8*  NA 132*  --  135  K 5.9*  --  4.7  CL 95*  --  95*  CO2 25  --  29  BUN 78*  --  50*  CREATININE 8.93*  --  6.05*    Microbiology: Results for orders placed or performed during the hospital encounter of 09/21/18  Blood Culture (routine x 2)     Status: None (Preliminary result)   Collection Time: 09/21/18  3:00 PM  Result Value Ref Range Status   Specimen Description BLOOD R AC  Final   Special Requests   Final    BOTTLES DRAWN AEROBIC AND ANAEROBIC Blood Culture results may not be optimal due to an excessive volume of blood received in culture  bottles   Culture   Final    NO GROWTH 2 DAYS Performed at [facility], [address] [place], [place] [address]    Report Status PENDING  Incomplete  Blood Culture (routine x 2)     Status: None (Preliminary result)   Collection Time: 09/21/18  3:00 PM  Result Value Ref Range Status   Specimen Description BLOOD R HAND  Final   Special Requests   Final    BOTTLES DRAWN AEROBIC AND ANAEROBIC Blood Culture adequate volume   Culture   Final    NO GROWTH 2 DAYS Performed at [facility], [address] [place], [place] [address]    Report Status PENDING  Incomplete  MRSA PCR Screening     Status: None   Collection Time: 09/21/18 11:36 PM  Result Value Ref Range Status   MRSA by PCR NEGATIVE NEGATIVE Final    Comment:        The GeneXpert MRSA Assay (FDA approved for NASAL specimens only), is one component of a comprehensive MRSA colonization surveillance program. It is not intended to diagnose MRSA infection nor to guide or monitor treatment for MRSA infections. Performed at [facility], [address], [place], [place] [address]     Studies/Results: Dg Chest 2 View  Result Date: 09/21/2018 CLINICAL DATA:  Chest pain. EXAM: CHEST - 2 VIEW COMPARISON:  05/02/2016 FINDINGS: Lateral views degraded by motion and patient size. Midline trachea. Moderate cardiomegaly. Atherosclerosis in the transverse aorta. No pleural effusion or pneumothorax. No overt congestive failure. Chronic interstitial thickening may represent mild pulmonary venous congestion. No lobar consolidation. IMPRESSION: Cardiomegaly with similar pulmonary venous congestion. Aortic Atherosclerosis (ICD10-I70.0). Electronically Signed   By: Abigail Miyamoto M.D.   On: 09/21/2018 16:08   Ct Head Wo Contrast  Result Date: 09/21/2018 CLINICAL DATA:  Pt arrives via em[facility], pt started c/o chest pain at 1100 this am and was given tylenol, pt cont to have pain and started jerking  so staff called 911, pt had a fever of 102.7 per ems, pt was given another dose of tylenol at 1400 of 650mg . 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 COMPARISON:  04/28/2016 FINDINGS: Brain: No evidence of acute infarction, hemorrhage, hydrocephalus, extra-axial collection or mass lesion/mass effect. There is ventricular and sulcal enlargement reflecting mild generalized atrophy, advanced for age. Patchy periventricular  white matter hypoattenuation is also noted consistent with mild chronic microvascular ischemic change. Vascular: No hyperdense vessel or unexpected calcification. Skull: Normal. Negative for fracture or focal lesion. Sinuses/Orbits: Visualized globes and orbits are unremarkable. The visualized sinuses and mastoid air cells are clear. Other: None. IMPRESSION: 1. No acute intracranial abnormalities. 2. Atrophy advanced for age. Mild chronic microvascular ischemic change. Electronically Signed   By: Lajean Manes M.D.   On: 09/21/2018 16:57    Assessment/Plan: JERNARD REIBER is a 57 y.o. male with ESRD, PAD, recent R BKA now admitted from NH with fevers, chills, hypotension, wbc 28, some diarrhea, CP and rapid A fib.  His R BKA stump site appears very clean and intact.  His AVF site LUE also appears normal. Flu negative. CT head neg and CXR neg for infection.  I think bacteremia from AVF site is most likely etiology, and cultures are pending. No evidence of meningitis despite some "shaking" which I suspect were rigors.  GI source also possible since some diarrhea.  12/10- febrile again but had blood at the same time. Otherwise feels a little better Recommendations Still unclear source Continue cefepime and vanco pending culture results. Thank you very much for the consult. Will follow with  you.  Leonel Ramsay   09/23/2018, 10:41 AM

## 2018-09-23 NOTE — Progress Notes (Signed)
Pt expressed to RN that he feels "fine" and did not want to take any pain medications today and did not want to take his 1400 dose of gabapentin. RN will continue to monitor pt.   Cedrica Brune [employer]

## 2018-09-24 LAB — GLUCOSE, CAPILLARY
Glucose-Capillary: 119 mg/dL — ABNORMAL HIGH (ref 70–99)
Glucose-Capillary: 139 mg/dL — ABNORMAL HIGH (ref 70–99)
Glucose-Capillary: 272 mg/dL — ABNORMAL HIGH (ref 70–99)
Glucose-Capillary: 144 mg/dL — ABNORMAL HIGH (ref 70–99)

## 2018-09-24 LAB — CBC
HCT: 22.8 % — ABNORMAL LOW (ref 39.0–52.0)
HEMATOCRIT: 23.8 % — AB (ref 39.0–52.0)
Hemoglobin: 6.7 g/dL — ABNORMAL LOW (ref 13.0–17.0)
Hemoglobin: 7 g/dL — ABNORMAL LOW (ref 13.0–17.0)
MCH: 29.1 pg (ref 26.0–34.0)
MCH: 29.4 pg (ref 26.0–34.0)
MCHC: 29.4 g/dL — ABNORMAL LOW (ref 30.0–36.0)
MCHC: 29.4 g/dL — ABNORMAL LOW (ref 30.0–36.0)
MCV: 99.1 fL (ref 80.0–100.0)
MCV: 100 fL (ref 80.0–100.0)
PLATELETS: 429 10*3/uL — AB (ref 150–400)
Platelets: 405 10*3/uL — ABNORMAL HIGH (ref 150–400)
RBC: 2.3 MIL/uL — ABNORMAL LOW (ref 4.22–5.81)
RBC: 2.38 MIL/uL — ABNORMAL LOW (ref 4.22–5.81)
RDW: 16.2 % — ABNORMAL HIGH (ref 11.5–15.5)
RDW: 16.1 % — ABNORMAL HIGH (ref 11.5–15.5)
WBC: 15.4 10*3/uL — ABNORMAL HIGH (ref 4.0–10.5)
WBC: 15.6 10*3/uL — ABNORMAL HIGH (ref 4.0–10.5)
nRBC: 0.1 % (ref 0.0–0.2)
nRBC: 0 % (ref 0.0–0.2)

## 2018-09-24 LAB — PREPARE RBC (CROSSMATCH)

## 2018-09-24 LAB — BASIC METABOLIC PANEL
Anion gap: 13 (ref 5–15)
BUN: 73 mg/dL — ABNORMAL HIGH (ref 6–20)
CO2: 27 mmol/L (ref 22–32)
Calcium: 9.2 mg/dL (ref 8.9–10.3)
Chloride: 94 mmol/L — ABNORMAL LOW (ref 98–111)
Creatinine, Ser: 7.96 mg/dL — ABNORMAL HIGH (ref 0.61–1.24)
GFR calc Af Amer: 8 mL/min — ABNORMAL LOW (ref >60)
GFR calc non Af Amer: 7 mL/min — ABNORMAL LOW (ref >60)
Glucose, Bld: 148 mg/dL — ABNORMAL HIGH (ref 70–99)
Potassium: 5.1 mmol/L (ref 3.5–5.1)
Sodium: 134 mmol/L — ABNORMAL LOW (ref 135–145)

## 2018-09-24 LAB — HEMOGLOBIN AND HEMATOCRIT, BLOOD
HCT: 26 % — ABNORMAL LOW (ref 39.0–52.0)
Hemoglobin: 7.9 g/dL — ABNORMAL LOW (ref 13.0–17.0)

## 2018-09-24 MED ORDER — SODIUM CHLORIDE 0.9% IV SOLUTION: Freq: Once | INTRAVENOUS | Status: DC

## 2018-09-24 MED ORDER — NITROGLYCERIN 0.4 MG SL SUBL
SUBLINGUAL_TABLET | SUBLINGUAL | Status: AC
  Administered 2018-09-24: 0.4 mg via SUBLINGUAL
  Filled 2018-09-24: qty 1

## 2018-09-24 MED ORDER — NITROGLYCERIN 0.4 MG SL SUBL
0.4000 mg | SUBLINGUAL_TABLET | Freq: Once | SUBLINGUAL | Status: AC
  Administered 2018-09-24: 0.4 mg via SUBLINGUAL

## 2018-09-24 MED ORDER — NEPRO/CARBSTEADY PO LIQD
237.0000 mL | Freq: Two times a day (BID) | ORAL | Status: DC
  Administered 2018-09-24 – 2018-09-28 (×7): 237 mL via ORAL

## 2018-09-24 NOTE — Progress Notes (Signed)
HD tx end    09/24/18 1305  Vital Signs  Pulse Rate (!) 117  Pulse Rate Source Monitor  Resp 12  BP 126/67  BP Location Right Arm  BP Method Automatic  Patient Position (if appropriate) Lying  Oxygen Therapy  SpO2 95 %  O2 Device Room Air  During Hemodialysis Assessment  Dialysis Fluid Bolus Normal Saline  Bolus Amount (mL) 250 mL  Intra-Hemodialysis Comments Tx completed

## 2018-09-24 NOTE — Care Management Important Message (Signed)
Copy of signed Medicare IM left in patient's room (out for dialysis).

## 2018-09-24 NOTE — Progress Notes (Signed)
Blood administration end , I unit of blood given during HD tx.    09/24/18 1145  Vital Signs  Temp 97.8 F (36.6 C)  Temp Source Oral  Pulse Rate (!) 113  Pulse Rate Source Monitor  Resp 15  BP 119/81  BP Location Right Arm  BP Method Automatic  Patient Position (if appropriate) Lying  Oxygen Therapy  SpO2 97 %  O2 Device Room Air  During Hemodialysis Assessment  Blood Flow Rate (mL/min) 400 mL/min  Arterial Pressure (mmHg) -220 mmHg  Venous Pressure (mmHg) 180 mmHg  Transmembrane Pressure (mmHg) 60 mmHg  Ultrafiltration Rate (mL/min) 600 mL/min  Dialysate Flow Rate (mL/min) 600 ml/min  Conductivity: Machine  13.9  HD Safety Checks Performed Yes  Intra-Hemodialysis Comments Progressing as prescribed (1284)

## 2018-09-24 NOTE — Progress Notes (Signed)
Pre HD assessment    09/24/18 0918  Neurological  Level of Consciousness Alert  Orientation Level Oriented X4  Respiratory  Respiratory Pattern Regular;Unlabored  Chest Assessment Chest expansion symmetrical  Cardiac  Pulse Irregular  ECG Monitor Yes  Cardiac Rhythm ST;Atrial fibrillation  Vascular  R Radial Pulse +2  L Radial Pulse +2  Edema Generalized  Integumentary  Integumentary (WDL) X  Skin Color Appropriate for ethnicity  Musculoskeletal  Musculoskeletal (WDL) X  Generalized Weakness Yes  Assistive Device None  GU Assessment  Genitourinary (WDL) X  Genitourinary Symptoms  (HD)  Psychosocial  Psychosocial (WDL) WDL

## 2018-09-24 NOTE — Progress Notes (Signed)
HD tx start   09/24/18 0924  Vital Signs  Pulse Rate 73  Pulse Rate Source Monitor  Resp 14  BP 120/60  BP Location Right Arm  BP Method Automatic  Patient Position (if appropriate) Lying  Oxygen Therapy  SpO2 90 %  O2 Device Room Air  During Hemodialysis Assessment  Blood Flow Rate (mL/min) 400 mL/min  Arterial Pressure (mmHg) -180 mmHg  Venous Pressure (mmHg) 150 mmHg  Transmembrane Pressure (mmHg) 70 mmHg  Ultrafiltration Rate (mL/min) 500 mL/min  Dialysate Flow Rate (mL/min) 600 ml/min  Conductivity: Machine  14.1  HD Safety Checks Performed Yes  Dialysis Fluid Bolus Normal Saline  Bolus Amount (mL) 250 mL  Intra-Hemodialysis Comments Tx initiated  Fistula / Graft Left Forearm Arteriovenous fistula  No Placement Date or Time found.   Placed prior to admission: Yes  Orientation: Left  Access Location: Forearm  Access Type: Arteriovenous fistula  Status Accessed  Needle Size 15

## 2018-09-24 NOTE — Progress Notes (Signed)
Pre HD assessment    09/24/18 0917  Vital Signs  Temp 97.7 F (36.5 C)  Temp Source Oral  Pulse Rate 73  Pulse Rate Source Monitor  Resp 15  BP (!) 103/44  BP Location Right Arm  BP Method Automatic  Patient Position (if appropriate) Lying  Oxygen Therapy  SpO2 90 %  O2 Device Room Air  Pain Assessment  Pain Scale 0-10  Pain Score 5  Pain Intervention(s) Medication (See eMAR)  Dialysis Weight  Weight 115.4 kg  Type of Weight Pre-Dialysis  Time-Out for Hemodialysis  What Procedure? HD  Pt Identifiers(min of two) First/Last Name;MRN/Account#  Correct Site? Yes  Correct Side? Yes  Correct Procedure? Yes  Consents Verified? Yes  Rad Studies Available? N/A  Safety Precautions Reviewed? Yes  Engineer, civil (consulting) Number  (7A)  Station Number  (4)  UF/Alarm Test Passed  Conductivity: Meter 14  Conductivity: Machine  14.1  pH 7.6  Reverse Osmosis main  Normal Saline Lot Number [id number]  Dialyzer Lot Number [id number]  Disposable Set Lot Number [id number]  Machine Temperature 98.6 F (37 C)  Musician and Audible Yes  Blood Lines Intact and Secured Yes  Pre Treatment Patient Checks  Vascular access used during treatment Fistula  Hepatitis B Surface Antigen Results Negative  Date Hepatitis B Surface Antigen Drawn 08/13/18  Hepatitis B Surface Antibody  (>10)  Date Hepatitis B Surface Antibody Drawn 08/13/18  Hemodialysis Consent Verified Yes  Hemodialysis Standing Orders Initiated Yes  ECG (Telemetry) Monitor On Yes  Prime Ordered Normal Saline  Length of  DialysisTreatment -hour(s) 3 Hour(s)  Dialyzer Elisio 17H NR  Dialysate 2K, 2.5 Ca  Dialysis Anticoagulant None  Dialysate Flow Ordered 600  Blood Flow Rate Ordered 400 mL/min  Ultrafiltration Goal 1 Liters  Pre Treatment Labs CBC  Dialysis Blood Pressure Support Ordered Normal Saline  Education / Care Plan  Dialysis Education Provided Yes  Documented Education in Care Plan Yes  Fistula / Graft Left  Forearm Arteriovenous fistula  No Placement Date or Time found.   Placed prior to admission: Yes  Orientation: Left  Access Location: Forearm  Access Type: Arteriovenous fistula  Site Condition No complications  Fistula / Graft Assessment Present;Thrill;Bruit  Drainage Description None

## 2018-09-24 NOTE — Progress Notes (Signed)
[facility]  ROUNDING NOTE   Subjective:   Seen and examined on hemodialysis. Tolerating treatment well. UF goal of 1 liter.   1 unit PRBC during dialysis treatment.   Afebrile last 24 hours.     HEMODIALYSIS FLOWSHEET:  Blood Flow Rate (mL/min): 400 mL/min Arterial Pressure (mmHg): -200 mmHg Venous Pressure (mmHg): 170 mmHg Transmembrane Pressure (mmHg): 60 mmHg Ultrafiltration Rate (mL/min): 600 mL/min Dialysate Flow Rate (mL/min): 600 ml/min Conductivity: Machine : 13.9 Conductivity: Machine : 13.9 Dialysis Fluid Bolus: Normal Saline Bolus Amount (mL): 250 mL    Objective:  Vital signs in last 24 hours:  Temp:  [97.7 F (36.5 C)-99 F (37.2 C)] 98.3 F (36.8 C) (12/11 1115) Pulse Rate:  [60-128] 77 (12/11 1115) Resp:  [10-20] 12 (12/11 1115) BP: (88-135)/(44-98) 109/83 (12/11 1115) SpO2:  [90 %-96 %] 96 % (12/11 1115) Weight:  [115.4 kg] 115.4 kg (12/11 0917)  Weight change:  Filed Weights   09/22/18 0943 09/22/18 1355 09/24/18 0917  Weight: 115.8 kg 113.4 kg 115.4 kg    Intake/Output: I/O last 3 completed shifts: In: 709.1 [P.O.:240; I.V.:6.6; Blood:305; IV Piggyback:157.5] Out: 0    Intake/Output this shift:  No intake/output data recorded.  Physical Exam: General: NAD,   Head: Normocephalic, atraumatic. Moist oral mucosal membranes  Eyes: Anicteric, PERRL  Neck: Supple, trachea midline  Lungs:  Clear to auscultation  Heart: irregular  Abdomen:  Soft, nontender, obese  Extremities:  bilateral amputations  Neurologic: Nonfocal, moving all four extremities  Skin: No lesions  Access: AVF    Basic Metabolic Panel: Recent Labs  Lab 09/21/18 1531 09/22/18 0838 09/23/18 0318 09/24/18 0512  NA 132* 132* 135 134*  K 6.3* 5.9* 4.7 5.1  CL 93* 95* 95* 94*  CO2 27 25 29 27   GLUCOSE 254* 158* 201* 148*  BUN 61* 78* 50* 73*  CREATININE 8.07* 8.93* 6.05* 7.96*  CALCIUM 8.5* 8.2* 8.5* 9.2  PHOS  --  4.2  --   --     Liver Function  Tests: Recent Labs  Lab 09/22/18 0838  ALBUMIN 2.8*   No results for input(s): LIPASE, AMYLASE in the last 168 hours. No results for input(s): AMMONIA in the last 168 hours.  CBC: Recent Labs  Lab 09/21/18 1531 09/22/18 0838 09/22/18 2215 09/23/18 0318 09/24/18 0512 09/24/18 1020  WBC 28.1* 19.4*  --  17.3* 15.4* 15.6*  HGB 8.0* 6.8* 7.3* 7.3* 6.7* 7.0*  HCT 26.8* 23.3* 24.5* 24.8* 22.8* 23.8*  MCV 98.5 100.0  --  99.2 99.1 100.0  PLT 576* 419*  --  421* 405* 429*    Cardiac Enzymes: Recent Labs  Lab 09/22/18 2215 09/23/18 0318 09/23/18 0836 09/23/18 1449 09/23/18 1952  TROPONINI 0.18* 0.25* 0.26* 0.20* 0.18*    BNP: Invalid input(s): POCBNP  CBG: Recent Labs  Lab 09/23/18 1134 09/23/18 1645 09/23/18 1812 09/23/18 2225 09/24/18 0748  GLUCAP 199* 196* 168* 214* 119*    Microbiology: Results for orders placed or performed during the hospital encounter of 09/21/18  Blood Culture (routine x 2)     Status: None (Preliminary result)   Collection Time: 09/21/18  3:00 PM  Result Value Ref Range Status   Specimen Description BLOOD R AC  Final   Special Requests   Final    BOTTLES DRAWN AEROBIC AND ANAEROBIC Blood Culture results may not be optimal due to an excessive volume of blood received in culture bottles   Culture   Final    NO GROWTH 3  DAYS Performed at [facility], [address] [place], [place] [address]    Report Status PENDING  Incomplete  Blood Culture (routine x 2)     Status: None (Preliminary result)   Collection Time: 09/21/18  3:00 PM  Result Value Ref Range Status   Specimen Description BLOOD R HAND  Final   Special Requests   Final    BOTTLES DRAWN AEROBIC AND ANAEROBIC Blood Culture adequate volume   Culture   Final    NO GROWTH 3 DAYS Performed at [facility], [address], [place], [place] [address]    Report Status PENDING  Incomplete  MRSA PCR Screening     Status: None   Collection Time: 09/21/18  11:36 PM  Result Value Ref Range Status   MRSA by PCR NEGATIVE NEGATIVE Final    Comment:        The GeneXpert MRSA Assay (FDA approved for NASAL specimens only), is one component of a comprehensive MRSA colonization surveillance program. It is not intended to diagnose MRSA infection nor to guide or monitor treatment for MRSA infections. Performed at [facility], [address] [place],  [address]     Coagulation Studies: No results for input(s): LABPROT, INR in the last 72 hours.  Urinalysis: No results for input(s): COLORURINE, LABSPEC, PHURINE, GLUCOSEU, HGBUR, BILIRUBINUR, KETONESUR, PROTEINUR, UROBILINOGEN, NITRITE, LEUKOCYTESUR in the last 72 hours.  Invalid input(s): APPERANCEUR    Imaging: No results found.   Medications:   . sodium chloride Stopped (09/23/18 1929)  . ceFEPime (MAXIPIME) IV Stopped (09/23/18 1852)  . vancomycin Stopped (09/22/18 1352)   . sodium chloride   Intravenous Once  . sodium chloride   Intravenous Once  . amiodarone  200 mg Oral Daily  . apixaban  5 mg Oral BID  . aspirin EC  81 mg Oral Daily  . cinacalcet  60 mg Oral Once per day on Mon Wed Fri  . ciprofloxacin-dexamethasone  4 drop Right EAR BID  . epoetin (EPOGEN/PROCRIT) injection  10,000 Units Intravenous Q M,W,F-HD  . feeding supplement (NEPRO CARB STEADY)  237 mL Oral BID BM  . feeding supplement (PRO-STAT SUGAR FREE 64)  30 mL Oral Daily  . ferrous sulfate  325 mg Oral TID WC  . gabapentin  100 mg Oral TID  . glipiZIDE  10 mg Oral Q breakfast  . insulin aspart  0-15 Units Subcutaneous TID WC  . insulin aspart  0-5 Units Subcutaneous QHS  . midodrine  5 mg Oral TID WC  . multivitamin  1 tablet Oral QHS  . pantoprazole  40 mg Oral Daily  . polyethylene glycol  17 g Oral BID  . senna-docusate  1 tablet Oral BID  . sevelamer carbonate  2,400 mg Oral TID WC  . sodium chloride flush  3 mL Intravenous Q12H  . sodium polystyrene  30 g Oral Once   sodium  chloride, acetaminophen, hydrALAZINE, lidocaine-prilocaine, midodrine, ondansetron **OR** ondansetron (ZOFRAN) IV, oxyCODONE, sodium chloride flush  Assessment/ Plan:  [name] is a 57 y.o. Hispanic (Spanish speaking only) malewith end stage renal disease, peripheral vascular disease, hypertension, CVA, hyperlipidemia, diabetes mellitus type II who is admitted to [facility] 106.5 kg Left AVF  1. End Stage Renal Disease:Seen and examined on hemodialysis treatment. Tolerating treatment well.  Continue MWF schedule      2. Anemia of chronic kidney disease: hemoglobin 7 status post PRBC transfusion on 12/9 -  EPO with HD treatment - 1 Unit PRBC transfusion with treatment  3. Hypotension - Midodrine before dialysis treatments  4. Secondary Hyperparathyroidism:  - continue Renvela and Sensipar with meals.  - Cinacalcet three times weekly.   5. Ear pain: empiric ciprofloxacin/dexamethasone otic solution.   6. Chest pain with atrial fibrillation: with elevated cardiac enzymes thought to be demand ischemia - Appreciate cardiology  7. Sepsis: now off antibiotics. Appreciate ID input.    LOS: 3 Ryett Hamman 12/11/201911:26 AM

## 2018-09-24 NOTE — Progress Notes (Signed)
Post HD assessment. Pt tolerated tx well without c/o or complication. Pt received 1 unit of blood during tx, per MD orders. Net UF 1044, goal met.    09/24/18 1314  Vital Signs  Temp 98.1 F (36.7 C)  Temp Source Oral  Pulse Rate (!) 128  Pulse Rate Source Monitor  Resp 14  BP 135/63  BP Location Right Arm  BP Method Automatic  Patient Position (if appropriate) Lying  Oxygen Therapy  SpO2 96 %  O2 Device Room Air  Dialysis Weight  Weight 115.1 kg  Type of Weight Post-Dialysis  Post-Hemodialysis Assessment  Rinseback Volume (mL) 250 mL  KECN 79.4 V  Dialyzer Clearance Lightly streaked  Duration of HD Treatment -hour(s) 3.5 hour(s)  Hemodialysis Intake (mL) 1000 mL  UF Total -Machine (mL) 2044 mL  Net UF (mL) 1044 mL  Tolerated HD Treatment Yes  AVG/AVF Arterial Site Held (minutes) 10 minutes  AVG/AVF Venous Site Held (minutes) 10 minutes  Education / Care Plan  Dialysis Education Provided Yes  Documented Education in Care Plan Yes  Fistula / Graft Left Forearm Arteriovenous fistula  No Placement Date or Time found.   Placed prior to admission: Yes  Orientation: Left  Access Location: Forearm  Access Type: Arteriovenous fistula  Site Condition No complications  Fistula / Graft Assessment Present;Thrill;Bruit  Status Deaccessed  Drainage Description None

## 2018-09-24 NOTE — Clinical Social Work Note (Signed)
Clinical Social Work Assessment  Patient Details  Name: [name] MRN: [id number] Date of Birth: 1961-06-14  Date of referral:  09/24/18               Reason for consult:  Facility Placement                Permission sought to share information with:  Facility Sport and exercise psychologist, Family Supports Permission granted to share information::  Yes, Verbal Permission Granted  Name::        Agency::     Relationship::     Contact Information:     Housing/Transportation Living arrangements for the past 2 months:  [place], [place] of Information:  Patient, Spouse Patient Interpreter Needed:  None Criminal Activity/Legal Involvement Pertinent to Current Situation/Hospitalization:  No - Comment as needed Significant Relationships:  Spouse Lives with:  Spouse Do you feel safe going back to the place where you live?  Yes Need for family participation in patient care:  Yes (Comment)  Care giving concerns:  Patient at baseline, lives with his wife. Patient has been at [facility] for short term rehab this past week.   Social Worker assessment / plan:  Patient and wife requested to speak with CSW. CSW utilized the hospital interpreter to speak with patient and wife. CSW introduced self. Patient and wife want to return to [facility] at discharge. Patient's wife was very focused on getting patient a motorized wheelchair and kept circling back to this no matter what the topic was. CSW explained that we would follow up with RN CM and see what, if anything, there was that we could do.  Employment status:  Disabled (Comment on whether or not currently receiving Disability) Insurance information:    PT Recommendations:    Information / Referral to community resources:     Patient/Family's Response to care:  Patient and husband expressed appreciation for CSW assistance.  Patient/Family's Understanding of and Emotional Response to Diagnosis, Current Treatment, and  Prognosis:  Patient was not very talkative during assessment and let his wife do much of the talking for him. Patient's wife is insistent they get a motorized wheelchair.  Emotional Assessment Appearance:  Appears stated age Attitude/Demeanor/Rapport:  (cooperative) Affect (typically observed):  Calm Orientation:  Oriented to Self, Oriented to Place, Oriented to  Time, Oriented to Situation Alcohol / Substance use:  Not Applicable Psych involvement (Current and /or in the community):  No (Comment)  Discharge Needs  Concerns to be addressed:  Care Coordination Readmission within the last 30 days:  Yes Current discharge risk:  None Barriers to Discharge:  No Barriers Identified   Shela Leff, LCSW 09/24/2018, 10:34 AM

## 2018-09-24 NOTE — Progress Notes (Signed)
PT Cancellation Note  Patient Details Name: [name] MRN: [id number] DOB: 1961/05/10   Cancelled Treatment:    Reason Eval/Treat Not Completed: Patient at procedure or test/unavailable. Re attempt at a later time/date, as pt available.    Larae Grooms, PTA 09/24/2018, 12:12 PM

## 2018-09-24 NOTE — Progress Notes (Signed)
PT C/O chest pain and tightness. Tele informed RN that pt is in a-fib again. VSS. MD aware. Orders for Sublingual Nitro once. Pt states pain is a little bit better after nitro. Pt due for HD today and blood transfusion.

## 2018-09-24 NOTE — Progress Notes (Signed)
Blood administration start , I unit    09/24/18 1100  Vital Signs  Temp 97.8 F (36.6 C)  Temp Source Oral  Pulse Rate (!) 128  Pulse Rate Source Monitor  Resp 14  BP (!) 88/53  BP Location Right Arm  BP Method Automatic  Patient Position (if appropriate) Lying  Oxygen Therapy  SpO2 96 %  O2 Device Room Air  During Hemodialysis Assessment  Blood Flow Rate (mL/min) 400 mL/min  Arterial Pressure (mmHg) -210 mmHg  Venous Pressure (mmHg) 170 mmHg  Transmembrane Pressure (mmHg) 70 mmHg  Ultrafiltration Rate (mL/min) 500 mL/min  Dialysate Flow Rate (mL/min) 600 ml/min  Conductivity: Machine  14  HD Safety Checks Performed Yes  Intra-Hemodialysis Comments Progressing as prescribed (816)

## 2018-09-24 NOTE — Progress Notes (Signed)
[place] at [place]: [name]    MR#:  [id number]  DATE OF BIRTH:  02/08/1961  SUBJECTIVE:   Patient seen in dialysis today.  Apparently was having chest pain earlier this morning.  No chest pain currently  REVIEW OF SYSTEMS:    Review of Systems  Constitutional:no fever, NOchills weight loss HENT: Negative for ear pain, nosebleeds, congestion, facial swelling, rhinorrhea, neck pain, neck stiffness and ear discharge.   Respiratory: Negative for cough, shortness of breath, wheezing  Cardiovascular: Negative for chest pain, palpitations and leg swelling.  Gastrointestinal: Negative for heartburn, abdominal pain, vomiting, diarrhea or consitpation Genitourinary: Negative for dysuria, urgency, frequency, hematuria Musculoskeletal: Negative for back pain or joint pain Neurological: Negative for dizziness, seizures, syncope, focal weakness,  numbness and headaches.  Hematological: Does not bruise/bleed easily.  Psychiatric/Behavioral: Negative for hallucinations, confusion, dysphoric mood    Tolerating Diet: yes      DRUG ALLERGIES:  No Known Allergies  VITALS:  Blood pressure 106/63, pulse 70, temperature 97.8 F (36.6 C), temperature source Oral, resp. rate (!) 25, height 5\' 10"  (1.778 m), weight 115.4 kg, SpO2 95 %.  PHYSICAL EXAMINATION:  Constitutional: Appears well-developed and well-nourished. No distress. HENT: Normocephalic. Marland Kitchen Oropharynx is clear and moist.  Eyes: Conjunctivae and EOM are normal. PERRLA, no scleral icterus.  Neck: Normal ROM. Neck supple. No JVD. No tracheal deviation. CVSrrr S1/S2 +, no murmurs, no gallops, no carotid bruit.  Pulmonary: Effort and breath sounds normal, no stridor, rhonchi, wheezes, rales.  Abdominal: Soft. BS +,  no distension, tenderness, rebound or guarding.  Musculoskeletal: Right AKA left BKA neuro: Alert. CN 2-12 grossly intact. No focal deficits. Skin: Skin is warm and dry.  No rash noted. Psychiatric: Normal mood and affect.      LABORATORY PANEL:   CBC Recent Labs  Lab 09/24/18 1020  WBC 15.6*  HGB 7.0*  HCT 23.8*  PLT 429*   ------------------------------------------------------------------------------------------------------------------  Chemistries  Recent Labs  Lab 09/24/18 0512  NA 134*  K 5.1  CL 94*  CO2 27  GLUCOSE 148*  BUN 73*  CREATININE 7.96*  CALCIUM 9.2   ------------------------------------------------------------------------------------------------------------------  Cardiac Enzymes Recent Labs  Lab 09/23/18 0836 09/23/18 1449 09/23/18 1952  TROPONINI 0.26* 0.20* 0.18*   ------------------------------------------------------------------------------------------------------------------  RADIOLOGY:  No results found.   ASSESSMENT AND PLAN:   57 year old male with end-stage renal disease on hemodialysis, peripheral vascular disease with recent AKA, diabetes  who presented to the ER due to fever.  1.  Sepsis: Patient presentedwith fever and leukocytosis. Sepsis from unclear etiology with negative chest x-ray. Right ear with otitis media Cultures have been negative. ID and vascular consultation appreciated Continue cefepime, Cipro OTIC eardrops for otitis media and vancomycin  2.  Acute on chronic anemia: Patient is status post 1 unit PRBC and is receiving another unit during dialysis today.   Discussed with cardiology and we will discontinue Eliquis.   Continue ferrous sulfate Epogen if indicated during dialysis  3.  History of hypotension Continue midodrine before dialysis treatments  4.  Peripheral artery disease status post recent right above-knee amputation: Vascular surgery consultation appreciated.  Stump looks clean and intact and is not source of sepsis.   5.  Diabetes: Continue current human with ADA diet  6.  PAF: Continue amiodarone Heart rate better controlled Currently in normal sinus  rhythm.  7.  End-stage renal disease on hemodialysis: Continue dialysis as per schedule by nephrology  8.  Chronic  systolic and diastolic heart failure without signs of exacerbation: Most recent echocardiogram November 2019 shows preserved ejection fraction with diastolic dysfunction.     9.  Chest pain: This pain is due to atrial fibrillation and anemia.  Patient ruled out for ACS. Cardiology consultation appreciated.   10.  Tremor: Nephrology place consultation for neurology as per the request of the family. Gabapentin dose reduced.  11.  Otitis media: Continue Cipro drops  Management plans discussed with the patient and he is in agreement.  CODE STATUS: Full  TOTAL TIME TAKING CARE OF THIS PATIENT: 24 minutes.    Palliative care consultation for goals of care. POSSIBLE D/C 2 days, DEPENDING ON CLINICAL CONDITION.   Maliaka Brasington M.D on 09/24/2018 at 12:28 PM  Between 7am to 6pm - Pager - [phone] After 6pm go to [email or web address] - password [email or web address] [facility]  Office  [phone]  CC: Primary care physician; [name], MD  Note: This dictation was prepared with Dragon dictation along with smaller phrase technology. Any transcriptional errors that result from this process are unintentional.

## 2018-09-24 NOTE — Progress Notes (Signed)
[place] INFECTIOUS DISEASE PROGRESS NOTE Date of Admission:  09/21/2018     ID: [name] is a 57 y.o. male with fevers, ESRD, anemia s/p transfusion, recent BKA Active Problems:   Sepsis ([place])   Paroxysmal atrial fibrillation ([place])   Subjective:  No further fevers, still some chest pain  ROS  Eleven systems are reviewed and negative except per hpi  Medications:  Antibiotics Given (last 72 hours)    Date/Time Action Medication Dose Rate   09/21/18 1714 New Bag/Given   ceFEPIme (MAXIPIME) 2 g in sodium chloride 0.9 % 100 mL IVPB 2 g 200 mL/hr   09/21/18 1803 New Bag/Given   vancomycin (VANCOCIN) IVPB 1000 mg/200 mL premix 1,000 mg 200 mL/hr   09/21/18 2303 New Bag/Given   vancomycin (VANCOCIN) IVPB 1000 mg/200 mL premix 1,000 mg 200 mL/hr   09/22/18 1252 New Bag/Given   vancomycin (VANCOCIN) IVPB 1000 mg/200 mL premix 1,000 mg 200 mL/hr   09/22/18 1807 New Bag/Given   ceFEPIme (MAXIPIME) 1 g in sodium chloride 0.9 % 100 mL IVPB 1 g 200 mL/hr   09/23/18 1820 New Bag/Given   ceFEPIme (MAXIPIME) 1 g in sodium chloride 0.9 % 100 mL IVPB 1 g 200 mL/hr   09/24/18 1200 New Bag/Given   vancomycin (VANCOCIN) IVPB 1000 mg/200 mL premix 1,000 mg 200 mL/hr     . sodium chloride   Intravenous Once  . sodium chloride   Intravenous Once  . amiodarone  200 mg Oral Daily  . aspirin EC  81 mg Oral Daily  . cinacalcet  60 mg Oral Once per day on Mon Wed Fri  . ciprofloxacin-dexamethasone  4 drop Right EAR BID  . epoetin (EPOGEN/PROCRIT) injection  10,000 Units Intravenous Q M,W,F-HD  . feeding supplement (NEPRO CARB STEADY)  237 mL Oral BID BM  . feeding supplement (PRO-STAT SUGAR FREE 64)  30 mL Oral Daily  . ferrous sulfate  325 mg Oral TID WC  . gabapentin  100 mg Oral TID  . glipiZIDE  10 mg Oral Q breakfast  . insulin aspart  0-15 Units Subcutaneous TID WC  . insulin aspart  0-5 Units Subcutaneous QHS  . midodrine  5 mg Oral TID WC  . multivitamin  1 tablet Oral QHS  .  pantoprazole  40 mg Oral Daily  . polyethylene glycol  17 g Oral BID  . senna-docusate  1 tablet Oral BID  . sevelamer carbonate  2,400 mg Oral TID WC  . sodium chloride flush  3 mL Intravenous Q12H  . sodium polystyrene  30 g Oral Once    Objective: Vital signs in last 24 hours: Temp:  [97.7 F (36.5 C)-99 F (37.2 C)] 98.1 F (36.7 C) (12/11 1314) Pulse Rate:  [60-128] 128 (12/11 1314) Resp:  [10-25] 13 (12/11 1317) BP: (88-135)/(44-98) 106/74 (12/11 1317) SpO2:  [90 %-97 %] 96 % (12/11 1314) Weight:  [115.1 kg-115.4 kg] 115.1 kg (12/11 1314) Constitutional: obese, lying in bed, alert HENT: anicteric, R ear with some moist fluid in ear canal but TM appears intact without redness or fluid Mouth/Throat: Oropharynx is clear and moist. No oropharyngeal exudate.  Cardiovascular: tachy irregular Pulmonary/Chest: Effort normal and breath sounds normal. No respiratory distress. He has no wheezes.  Abdominal: obese Soft. Bowel sounds are normal. He exhibits no distension. There is no tenderness.  Lymphadenopathy: He has no cervical adenopathy.  Neurological: He is alert and oriented to person, place, and time.  Skin: Skin is warm and dry.  No rash noted. No erythema.  R BKA site has very well approximated incision site with no drainage, redness or tenderness L BKA site healed over Psychiatric: He has a normal mood and affect. His behavior is normal.  AVF LUE wnl, non tender   Lab Results Recent Labs    09/23/18 0318 09/24/18 0512 09/24/18 1020  WBC 17.3* 15.4* 15.6*  HGB 7.3* 6.7* 7.0*  HCT 24.8* 22.8* 23.8*  NA 135 134*  --   K 4.7 5.1  --   CL 95* 94*  --   CO2 29 27  --   BUN 50* 73*  --   CREATININE 6.05* 7.96*  --     Microbiology: Results for orders placed or performed during the hospital encounter of 09/21/18  Blood Culture (routine x 2)     Status: None (Preliminary result)   Collection Time: 09/21/18  3:00 PM  Result Value Ref Range Status   Specimen  Description BLOOD R AC  Final   Special Requests   Final    BOTTLES DRAWN AEROBIC AND ANAEROBIC Blood Culture results may not be optimal due to an excessive volume of blood received in culture bottles   Culture   Final    NO GROWTH 3 DAYS Performed at [facility], [address], [place], [place] [address]    Report Status PENDING  Incomplete  Blood Culture (routine x 2)     Status: None (Preliminary result)   Collection Time: 09/21/18  3:00 PM  Result Value Ref Range Status   Specimen Description BLOOD R HAND  Final   Special Requests   Final    BOTTLES DRAWN AEROBIC AND ANAEROBIC Blood Culture adequate volume   Culture   Final    NO GROWTH 3 DAYS Performed at [facility], [address], [place], [place] [address]    Report Status PENDING  Incomplete  MRSA PCR Screening     Status: None   Collection Time: 09/21/18 11:36 PM  Result Value Ref Range Status   MRSA by PCR NEGATIVE NEGATIVE Final    Comment:        The GeneXpert MRSA Assay (FDA approved for NASAL specimens only), is one component of a comprehensive MRSA colonization surveillance program. It is not intended to diagnose MRSA infection nor to guide or monitor treatment for MRSA infections. Performed at [facility], [address], [place],  [address]     Studies/Results: No results found.  Assessment/Plan: [name] is a 57 y.o. male with ESRD, PAD, recent R BKA now admitted from NH with fevers, chills, hypotension, wbc 28, some diarrhea, CP and rapid A fib.  His R BKA stump site appears very clean and intact.  His AVF site LUE also appears normal. Flu negative. CT head neg and CXR neg for infection.  I think bacteremia from AVF site is most likely etiology, and cultures are pending. No evidence of meningitis despite some "shaking" which I suspect were rigors.  GI source also possible since some diarrhea.  12/10- febrile again but had blood at the same time. Otherwise  feels a little better 12/11 - no further fevers. Cultures negative. Recommendations Still unclear source Continue cefepime and vanco pending culture results. Thank you very much for the consult. Will follow with you.  Leonel Ramsay   09/24/2018, 1:21 PM

## 2018-09-24 NOTE — Progress Notes (Signed)
PALLIATIVE NOTE:  Referral received for goals of care discussion. Of note patient is in dialysis. Palliative will follow up with patient and wife tomorrow for goals of care discussion.   Wife aware that I will follow up with them on tomorrow.   Detailed note and recommendation to follow.   Thank you for your referral.   Alda Lea, AGPCNP-BC Palliative Medicine Team  Phone: [phone] Fax: [phone] Pager: [phone] Amion: N. [place]

## 2018-09-24 NOTE — NC FL2 (Signed)
[place] LEVEL OF CARE SCREENING TOOL     IDENTIFICATION  Patient Name: [name] Birthdate: 04-05-1961 Sex: male Admission Date (Current Location): 09/21/2018  [place] and [place] Number:  Engineering geologist and Address:  [facility], [address], [place], [place] [address]      Provider Number: [id number]  Attending Physician Name and Address:  Bettey Costa, MD  Relative Name and Phone Number:       Current Level of Care: Hospital Recommended Level of Care: [place] Prior Approval Number:    Date Approved/Denied:   PASRR Number:    Discharge Plan: SNF    Current Diagnoses: Patient Active Problem List   Diagnosis Date Noted  . Paroxysmal atrial fibrillation (HCC)   . Sepsis ([place]) 09/21/2018  . Status post above-knee amputation ([place]) 09/18/2018  . End stage renal disease on dialysis ([place])   . NSVT (nonsustained ventricular tachycardia) ([place])   . Atrial fibrillation with rapid ventricular response ([place])   . Demand ischemia ([place])   . Limb ischemia 09/03/2018  . Arterial occlusion 08/12/2018  . ESRD on dialysis ([place]) 11/09/2016  . Diabetes ([place]) 11/09/2016  . PVD (peripheral vascular disease) ([place]) 11/09/2016  . Cardiac arrest ([place]) 04/28/2016  . Atherosclerosis of extremity with gangrene ([place]) 04/25/2016  . Diabetic polyneuropathy associated with type 2 diabetes mellitus ([place]) 12/09/2015  . Diabetic ulcer of right foot associated with type 2 diabetes mellitus ([place]) 12/09/2015  . Anemia 03/24/2014  . Left foot burn 03/17/2014  . Leukocytosis 03/17/2014  . Obesity 03/17/2014  . PND (paroxysmal nocturnal dyspnea) 06/29/2013  . Volume overload 06/29/2013  . Diastolic congestive heart failure, NYHA class 2 ([place]) 06/28/2013  . Shortness of breath 06/28/2013  . After-cataract with vision obscured 08/01/2011  . Anisometropia 08/01/2011  . Background diabetic retinopathy ([place]) 08/01/2011  . Senile nuclear  sclerosis 08/01/2011    Orientation RESPIRATION BLADDER Height & Weight     Self, Time, Situation, Place  Normal Continent Weight: 254 lb 6.6 oz (115.4 kg) Height:  5\' 10"  (177.8 cm)  BEHAVIORAL SYMPTOMS/MOOD NEUROLOGICAL BOWEL NUTRITION STATUS  (none) (none) Continent Diet(renal/carb modified)  AMBULATORY STATUS COMMUNICATION OF NEEDS Skin   Extensive Assist Verbally Normal                       Personal Care Assistance Level of Assistance  Bathing, Dressing Bathing Assistance: Limited assistance Feeding assistance: Independent Dressing Assistance: Limited assistance     Functional Limitations Info  (none)          SPECIAL CARE FACTORS FREQUENCY  PT (By licensed PT)(outpatient hemodialysis)                    Contractures Contractures Info: Not present    Additional Factors Info  Code Status Code Status Info: full             Current Medications (09/24/2018):  This is the current hospital active medication list Current Facility-Administered Medications  Medication Dose Route Frequency Provider Last Rate Last Dose  . 0.9 %  sodium chloride infusion (Manually program via Guardrails IV Fluids)   Intravenous Once Kolluru, Sarath, MD      . 0.9 %  sodium chloride infusion (Manually program via Guardrails IV Fluids)   Intravenous Once Mody, Sital, MD      . 0.9 %  sodium chloride infusion  250 mL Intravenous PRN Salary, Avel Peace, MD   Stopped at 09/23/18  1929  . acetaminophen (TYLENOL) tablet 650 mg  650 mg Oral Q6H PRN Loney Hering D, MD   650 mg at 09/23/18 0109  . amiodarone (PACERONE) tablet 200 mg  200 mg Oral Daily Salary, Montell D, MD   200 mg at 09/24/18 0837  . apixaban (ELIQUIS) tablet 5 mg  5 mg Oral BID Loney Hering D, MD   5 mg at 09/24/18 0837  . aspirin EC tablet 81 mg  81 mg Oral Daily Salary, Montell D, MD   81 mg at 09/24/18 0837  . ceFEPIme (MAXIPIME) 1 g in sodium chloride 0.9 % 100 mL IVPB  1 g Intravenous Q24H Lu Duffel, [facility]   Stopped at 09/23/18 1950  . cinacalcet (SENSIPAR) tablet 60 mg  60 mg Oral Once per day on Mon Wed Fri Lavonia Dana, MD   60 mg at 09/24/18 9326  . ciprofloxacin-dexamethasone (CIPRODEX) 0.3-0.1 % OTIC (EAR) suspension 4 drop  4 drop Right EAR BID Bettey Costa, MD   4 drop at 09/23/18 2213  . epoetin alfa (EPOGEN,PROCRIT) injection 10,000 Units  10,000 Units Intravenous Q M,W,F-HD Kolluru, Sarath, MD   10,000 Units at 09/24/18 1031  . feeding supplement (NEPRO CARB STEADY) liquid 237 mL  237 mL Oral BID BM Mody, Sital, MD   237 mL at 09/24/18 0830  . feeding supplement (PRO-STAT SUGAR FREE 64) liquid 30 mL  30 mL Oral Daily Salary, Montell D, MD      . ferrous sulfate tablet 325 mg  325 mg Oral TID WC Salary, Montell D, MD   325 mg at 09/24/18 0837  . gabapentin (NEURONTIN) capsule 100 mg  100 mg Oral TID Lavonia Dana, MD   100 mg at 09/23/18 2209  . glipiZIDE (GLUCOTROL) tablet 10 mg  10 mg Oral Q breakfast Salary, Montell D, MD   10 mg at 09/24/18 0842  . hydrALAZINE (APRESOLINE) injection 10 mg  10 mg Intravenous Q4H PRN Salary, Montell D, MD      . insulin aspart (novoLOG) injection 0-15 Units  0-15 Units Subcutaneous TID WC Salary, Montell D, MD   3 Units at 09/23/18 1814  . insulin aspart (novoLOG) injection 0-5 Units  0-5 Units Subcutaneous QHS Salary, Montell D, MD   2 Units at 09/23/18 2238  . lidocaine-prilocaine (EMLA) cream 1 application  1 application Topical PRN Salary, Montell D, MD      . midodrine (PROAMATINE) tablet 10 mg  10 mg Oral Q6H PRN Kolluru, Sarath, MD   10 mg at 09/22/18 1118  . midodrine (PROAMATINE) tablet 5 mg  5 mg Oral TID WC Salary, Montell D, MD   5 mg at 09/24/18 0842  . multivitamin (RENA-VIT) tablet 1 tablet  1 tablet Oral QHS Loney Hering D, MD   1 tablet at 09/23/18 2208  . ondansetron (ZOFRAN) tablet 4 mg  4 mg Oral Q6H PRN Salary, Montell D, MD       Or  . ondansetron (ZOFRAN) injection 4 mg  4 mg Intravenous Q6H PRN Salary, Montell D, MD       . oxyCODONE (Oxy IR/ROXICODONE) immediate release tablet 5 mg  5 mg Oral Q8H PRN Salary, Montell D, MD   5 mg at 09/22/18 1118  . pantoprazole (PROTONIX) EC tablet 40 mg  40 mg Oral Daily Salary, Montell D, MD   40 mg at 09/23/18 0844  . polyethylene glycol (MIRALAX / GLYCOLAX) packet 17 g  17 g Oral BID Salary, Avel Peace, MD  17 g at 09/23/18 0845  . senna-docusate (Senokot-S) tablet 1 tablet  1 tablet Oral BID Loney Hering D, MD   1 tablet at 09/23/18 2209  . sevelamer carbonate (RENVELA) tablet 2,400 mg  2,400 mg Oral TID WC Salary, Montell D, MD   2,400 mg at 09/23/18 1840  . sodium chloride flush (NS) 0.9 % injection 3 mL  3 mL Intravenous Q12H Salary, Montell D, MD   3 mL at 09/23/18 2211  . sodium chloride flush (NS) 0.9 % injection 3 mL  3 mL Intravenous PRN Salary, Montell D, MD      . sodium polystyrene (KAYEXALATE) 15 GM/60ML suspension 30 g  30 g Oral Once Salary, Montell D, MD      . vancomycin (VANCOCIN) IVPB 1000 mg/200 mL premix  1,000 mg Intravenous Q M,W,F-HD Lu Duffel, [place] at 09/22/18 1352     Discharge Medications: Please see discharge summary for a list of discharge medications.  Relevant Imaging Results:  Relevant Lab Results:   Additional Information    Shela Leff, LCSW

## 2018-09-24 NOTE — Progress Notes (Signed)
Post HD assessment    09/24/18 1314  Neurological  Level of Consciousness Alert  Orientation Level Oriented X4  Respiratory  Respiratory Pattern Regular;Unlabored  Chest Assessment Chest expansion symmetrical  Cardiac  Pulse Irregular  ECG Monitor Yes  Cardiac Rhythm ST;Atrial fibrillation  Vascular  R Radial Pulse +2  L Radial Pulse +2  Edema Generalized  Integumentary  Integumentary (WDL) X  Skin Color Appropriate for ethnicity  Musculoskeletal  Musculoskeletal (WDL) X  Generalized Weakness Yes  Assistive Device None  GU Assessment  Genitourinary (WDL) X  Genitourinary Symptoms  (HD)  Psychosocial  Psychosocial (WDL) WDL

## 2018-09-25 ENCOUNTER — Inpatient Hospital Stay: Payer: Medicare Other

## 2018-09-25 DIAGNOSIS — G253 Myoclonus: Secondary | ICD-10-CM

## 2018-09-25 DIAGNOSIS — Z7189 Other specified counseling: Secondary | ICD-10-CM

## 2018-09-25 DIAGNOSIS — Z515 Encounter for palliative care: Secondary | ICD-10-CM

## 2018-09-25 LAB — BASIC METABOLIC PANEL
ANION GAP: 12 (ref 5–15)
BUN: 57 mg/dL — ABNORMAL HIGH (ref 6–20)
CO2: 28 mmol/L (ref 22–32)
Calcium: 9.1 mg/dL (ref 8.9–10.3)
Chloride: 95 mmol/L — ABNORMAL LOW (ref 98–111)
Creatinine, Ser: 5.69 mg/dL — ABNORMAL HIGH (ref 0.61–1.24)
GFR calc Af Amer: 12 mL/min — ABNORMAL LOW (ref >60)
GFR calc non Af Amer: 10 mL/min — ABNORMAL LOW (ref >60)
Glucose, Bld: 208 mg/dL — ABNORMAL HIGH (ref 70–99)
Potassium: 4.9 mmol/L (ref 3.5–5.1)
Sodium: 135 mmol/L (ref 135–145)

## 2018-09-25 LAB — TYPE AND SCREEN
ABO/RH(D): O POS
Antibody Screen: NEGATIVE
Unit division: 0
Unit division: 0

## 2018-09-25 LAB — CBC
HEMATOCRIT: 24.4 % — AB (ref 39.0–52.0)
Hemoglobin: 7.2 g/dL — ABNORMAL LOW (ref 13.0–17.0)
MCH: 28.8 pg (ref 26.0–34.0)
MCHC: 29.5 g/dL — ABNORMAL LOW (ref 30.0–36.0)
MCV: 97.6 fL (ref 80.0–100.0)
Platelets: 430 10*3/uL — ABNORMAL HIGH (ref 150–400)
RBC: 2.5 MIL/uL — AB (ref 4.22–5.81)
RDW: 17.2 % — ABNORMAL HIGH (ref 11.5–15.5)
WBC: 13.5 10*3/uL — ABNORMAL HIGH (ref 4.0–10.5)
nRBC: 0.2 % (ref 0.0–0.2)

## 2018-09-25 LAB — GLUCOSE, CAPILLARY
Glucose-Capillary: 172 mg/dL — ABNORMAL HIGH (ref 70–99)
Glucose-Capillary: 177 mg/dL — ABNORMAL HIGH (ref 70–99)
Glucose-Capillary: 201 mg/dL — ABNORMAL HIGH (ref 70–99)

## 2018-09-25 LAB — BPAM RBC
Blood Product Expiration Date: 201912132359
Blood Product Expiration Date: 202001032359
ISSUE DATE / TIME: 201912091817
ISSUE DATE / TIME: 201912111046
Unit Type and Rh: 5100
Unit Type and Rh: 9500

## 2018-09-25 MED ORDER — METOPROLOL TARTRATE 5 MG/5ML IV SOLN
5.0000 mg | INTRAVENOUS | Status: DC | PRN
  Filled 2018-09-25: qty 5

## 2018-09-25 MED ORDER — METOPROLOL TARTRATE 25 MG PO TABS
12.5000 mg | ORAL_TABLET | Freq: Two times a day (BID) | ORAL | Status: DC
  Administered 2018-09-25 – 2018-09-27 (×3): 12.5 mg via ORAL
  Filled 2018-09-25 (×5): qty 1

## 2018-09-25 MED ORDER — METOCLOPRAMIDE HCL 5 MG/ML IJ SOLN
5.0000 mg | Freq: Four times a day (QID) | INTRAMUSCULAR | Status: DC | PRN
  Administered 2018-09-26: 5 mg via INTRAVENOUS
  Filled 2018-09-25: qty 2

## 2018-09-25 MED ORDER — GERHARDT'S BUTT CREAM
TOPICAL_CREAM | Freq: Four times a day (QID) | CUTANEOUS | Status: DC
  Administered 2018-09-25 – 2018-09-27 (×8): via TOPICAL
  Filled 2018-09-25: qty 1

## 2018-09-25 NOTE — Progress Notes (Addendum)
Pt.'s HR is jumping in between 130-150s and in afib. Pt is diaphoretic, feels nausea, no chest pain at this time. MD notified, new orders placed by MD. RN gave prescribed 12.5 mg PO Lopressor.  BP 124/62 Pt converted back to NSR at 1840 with HR in 70s. PRN nausea medication at this time. RN will continue to monitor pt closely.   [name] [employer]

## 2018-09-25 NOTE — Progress Notes (Signed)
[place] INFECTIOUS DISEASE PROGRESS NOTE Date of Admission:  09/21/2018     ID: ABDUL BEIRNE is a 57 y.o. male with fevers, ESRD, anemia s/p transfusion, recent BKA Active Problems:   Sepsis ([place])   Paroxysmal atrial fibrillation ([place])   Subjective:  No further fevers, still some chest pain  ROS  Eleven systems are reviewed and negative except per hpi  Medications:  Antibiotics Given (last 72 hours)    Date/Time Action Medication Dose Rate   09/22/18 1252 New Bag/Given   vancomycin (VANCOCIN) IVPB 1000 mg/200 mL premix 1,000 mg 200 mL/hr   09/22/18 1807 New Bag/Given   ceFEPIme (MAXIPIME) 1 g in sodium chloride 0.9 % 100 mL IVPB 1 g 200 mL/hr   09/23/18 1820 New Bag/Given   ceFEPIme (MAXIPIME) 1 g in sodium chloride 0.9 % 100 mL IVPB 1 g 200 mL/hr   09/24/18 1200 New Bag/Given   vancomycin (VANCOCIN) IVPB 1000 mg/200 mL premix 1,000 mg 200 mL/hr   09/24/18 1722 New Bag/Given   ceFEPIme (MAXIPIME) 1 g in sodium chloride 0.9 % 100 mL IVPB 1 g 200 mL/hr     . sodium chloride   Intravenous Once  . sodium chloride   Intravenous Once  . amiodarone  200 mg Oral Daily  . aspirin EC  81 mg Oral Daily  . cinacalcet  60 mg Oral Once per day on Mon Wed Fri  . ciprofloxacin-dexamethasone  4 drop Right EAR BID  . epoetin (EPOGEN/PROCRIT) injection  10,000 Units Intravenous Q M,W,F-HD  . feeding supplement (NEPRO CARB STEADY)  237 mL Oral BID BM  . feeding supplement (PRO-STAT SUGAR FREE 64)  30 mL Oral Daily  . ferrous sulfate  325 mg Oral TID WC  . gabapentin  100 mg Oral TID  . glipiZIDE  10 mg Oral Q breakfast  . insulin aspart  0-15 Units Subcutaneous TID WC  . insulin aspart  0-5 Units Subcutaneous QHS  . midodrine  5 mg Oral TID WC  . multivitamin  1 tablet Oral QHS  . pantoprazole  40 mg Oral Daily  . polyethylene glycol  17 g Oral BID  . senna-docusate  1 tablet Oral BID  . sevelamer carbonate  2,400 mg Oral TID WC  . sodium chloride flush  3 mL Intravenous Q12H  .  sodium polystyrene  30 g Oral Once    Objective: Vital signs in last 24 hours: Temp:  [97.7 F (36.5 C)-98.7 F (37.1 C)] 98.4 F (36.9 C) (12/12 1220) Pulse Rate:  [68-128] 71 (12/12 1220) Resp:  [12-20] 18 (12/12 0552) BP: (106-135)/(37-97) 131/97 (12/12 1220) SpO2:  [95 %-98 %] 97 % (12/12 1220) Weight:  [115.1 kg] 115.1 kg (12/11 1314) Constitutional: obese, lying in bed, alert HENT: anicteric, R ear with some moist fluid in ear canal but TM appears intact without redness or fluid Mouth/Throat: Oropharynx is clear and moist. No oropharyngeal exudate.  Cardiovascular: tachy irregular Pulmonary/Chest: Effort normal and breath sounds normal. No respiratory distress. He has no wheezes.  Abdominal: obese Soft. Bowel sounds are normal. He exhibits no distension. There is no tenderness.  Lymphadenopathy: He has no cervical adenopathy.  Neurological: He is alert and oriented to person, place, and time.  Skin: Skin is warm and dry. No rash noted. No erythema.  R BKA site has very well approximated incision site with no drainage, redness or tenderness L BKA site healed over Psychiatric: He has a normal mood and affect. His behavior is normal.  AVF LUE wnl, non tender   Lab Results Recent Labs    09/24/18 0512 09/24/18 1020 09/24/18 1518 09/25/18 0523  WBC 15.4* 15.6*  --  13.5*  HGB 6.7* 7.0* 7.9* 7.2*  HCT 22.8* 23.8* 26.0* 24.4*  NA 134*  --   --  135  K 5.1  --   --  4.9  CL 94*  --   --  95*  CO2 27  --   --  28  BUN 73*  --   --  57*  CREATININE 7.96*  --   --  5.69*    Microbiology: Results for orders placed or performed during the hospital encounter of 09/21/18  Blood Culture (routine x 2)     Status: None (Preliminary result)   Collection Time: 09/21/18  3:00 PM  Result Value Ref Range Status   Specimen Description BLOOD R AC  Final   Special Requests   Final    BOTTLES DRAWN AEROBIC AND ANAEROBIC Blood Culture results may not be optimal due to an excessive  volume of blood received in culture bottles   Culture   Final    NO GROWTH 4 DAYS Performed at [facility], [address] [place], [place] [address]    Report Status PENDING  Incomplete  Blood Culture (routine x 2)     Status: None (Preliminary result)   Collection Time: 09/21/18  3:00 PM  Result Value Ref Range Status   Specimen Description BLOOD R HAND  Final   Special Requests   Final    BOTTLES DRAWN AEROBIC AND ANAEROBIC Blood Culture adequate volume   Culture   Final    NO GROWTH 4 DAYS Performed at [facility], [address], [place], [place] [address]    Report Status PENDING  Incomplete  MRSA PCR Screening     Status: None   Collection Time: 09/21/18 11:36 PM  Result Value Ref Range Status   MRSA by PCR NEGATIVE NEGATIVE Final    Comment:        The GeneXpert MRSA Assay (FDA approved for NASAL specimens only), is one component of a comprehensive MRSA colonization surveillance program. It is not intended to diagnose MRSA infection nor to guide or monitor treatment for MRSA infections. Performed at [facility], [address] [place], [place] [address]     Studies/Results: No results found.  Assessment/Plan: STYLES FAMBRO is a 57 y.o. male with ESRD, PAD, recent R BKA now admitted from NH with fevers, chills, hypotension, wbc 28, some diarrhea, CP and rapid A fib.  His R BKA stump site appears very clean and intact.  His AVF site LUE also appears normal. Flu negative. CT head neg and CXR neg for infection.  I think bacteremia from AVF site is most likely etiology, and cultures are pending. No evidence of meningitis despite some "shaking" which I suspect were rigors.  GI source also possible since some diarrhea.  12/10- febrile again but had blood at the same time. Otherwise feels a little better 12/11 - no further fevers. Cultures negative. 12/12 still afebrile but complaining of CP both before and after HD. Relieved by nitro.  Some R stump pain since hit his leg in HD when transferring.  Still co weakness and shaking in hands Recommendations Still unclear source of initial fever but has improved. Cxs all negative Cont  vanco empirically at this point for a total 14 day course given some concern for HD access infection although  site looks good and neg bcx  Dc cefepime given no evidence GN infection and having myoclonus. 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 Cards fu for continued CP  Thank you very much for the consult. Will follow with you.  Leonel Ramsay   09/25/2018, 12:33 PM

## 2018-09-25 NOTE — Progress Notes (Signed)
New low- air mattress was ordered for pt and will be delivered this evening. Pt and pt.'s wife refuses to take his undergarments off, RN explained that undergarments hold in moisture. Pt and pt.'s wife prefers for her to help clean and bath pt. RN explained that nursing staff needs to examine and treat his bed sores on his bottom. RN has applied Gerhardts butt cream to bed sores on his buttocks.  Melonee Gerstel [employer]

## 2018-09-25 NOTE — Consult Note (Signed)
Consultation Note Date: 09/25/2018   Patient Name: [name]  DOB: 1961-01-23  MRN: [id number]  Age / Sex: 57 y.o., male  PCP: Revelo, Elyse Jarvis, MD Referring Physician: Bettey Costa, MD  Reason for Consultation: Establishing goals of care  HPI/Patient Profile: 57 y.o. male admitted on 09/21/2018 from [facility] with complaints of fever and chest pain. He has a past medical history of atrial fibrillation with RVR, gangrene and right lower extremity ischemia (s/p Right BKA), anemia, CAD, MI, hyperlipidemia, hypertension, AKA left 12/4915, CVA, systolic CHF, ESRD on HD. Patient was recently discharged to facility for rehab s/p BKA. Facility reports patient was febrile at 102.7 with tremors, chest pain, and jerking of his face and left arms. During his ED course patient was febrile, tachycardic, and hypotensive. WBC 28, K 6.3, Glucose 254, lactic acid 2.3, Cr 8, Chest x-ray showed cardiomegaly with stable pulmonary congestion. Head CT negative for abnormalities. Patient was started on Vancomycin and cefepime post cultures and sepsis protocol. Since admission continues with tremors, with episodes of chest pain. Blood cultures have remained negative. Patient also started on cipro eardrops for otitis media. He continues on HD with midodrine prior to treatments. Palliative Medicine team consulted for goals of care.   Clinical Assessment and Goals of Care: I have reviewed medical records including lab results, imaging, Epic notes, and MAR, received report from the bedside RN, and assessed the patient. I then met at the bedside with patient and his wife, [name] to discuss diagnosis prognosis, [employer], EOL wishes, disposition and options. Patient is awake, A&O x3. Due to Spanish being primary language with limited [place], Spanish interpreter video system was utilized.   I introduced Palliative Medicine as  specialized medical care for people living with serious illness. It focuses on providing relief from the symptoms and stress of a serious illness. The goal is to improve quality of life for both the patient and the family.  Dr. Abigail Butts and Dr. Ola Spurr also present during part of visit with patient and spouse.   Wife reports patient is at [facility] for rehabilitation. Patient reports he is actively participating in rehab daily at the facility. Wife states patient needs a motorized wheelchair to get around. Attempted to discuss that his PCP and outpatient team can facilitate that once patient he is more stable and they feel it is appropriate. Wife states patient is generally in the bed or up in wheelchair during the day at the facility. Appetite is good. He is able to assist some with ADLs   We discussed his current illness and what it means in the larger context of his on-going co-morbidities.  Natural disease trajectory and expectations at EOL were discussed. Wife is focused on the medical team getting him a motorized wheelchair and to find out why he continues to have tremors/rigors. Attempted to explain that Neurology and other medical providers are doing all test that are necessary and making adjustments in regards to his tremors. Both providers discussed during their assessment their plan of care  and recommendations to discontinue one of his antibiotics with hopes of resolving tremors or decreasing. I re-emphasized the plan as outlined by the medical team.   I attempted to elicit values and goals of care important to the patient.    Wife and patient both continues to request full aggressive medical interventions. Wife states they would like more testing done to see what is wrong with patient. Patient is tearful again and states "I just need help, I don't want to die. I am helpless!" support given.   I discussed in details his current code status and what a potential code would look like given  his current illness and co-morbidities.  Patient confirms wishes for full code, full aggressive measures.  Hospice and Palliative Care services outpatient were explained and offered. Given patient's expressed goals for full code with aggressive measures. I recommended at minimum outpatient palliative care at discharge. Both patient and wife verbalized understanding. They would like outpatient palliative support.   Questions and concerns were addressed.  The family was encouraged to call with questions or concerns.  PMT will continue to support holistically.   Primary Decision Maker:  PATIENT    SUMMARY OF RECOMMENDATIONS    Full Code-as confirmed by patient/wife  Continue to treat the treatable with aggressive measures.   Patient and wife agrees with outpatient palliative support at discharge.   Patient has ulcerations to sacrum, WOC, RN consult placed for evaluation and treatment recommendation  CSW referral for outpatient palliative at discharge.   Would recommend antidepressant for depression secondary to bilateral BKA, lose of independency, tearful, patient and wife endorse depression. No prior psychiatric evaluations or treatments for depression.   Palliative Medicine team will continue to support patient, patient's family, and medical team during hospitalization.   Code Status/Advance Care Planning:  Full code  Palliative Prophylaxis:   Aspiration, Bowel Regimen, Frequent Pain Assessment, Palliative Wound Care and Turn Reposition  Additional Recommendations (Limitations, Scope, Preferences):  Full Scope Treatment  Psycho-social/Spiritual:   Desire for further Chaplaincy support:NO   Additional Recommendations: Caregiving  Support/Resources and Referral to Community Resources   Prognosis:   Guarded to Poor in the setting of sepsis, bilateral BKA, decreased mobility, rigors, chest pain, anemia requiring frequent blood transfusions, atrial fibrillation with RVR,  CAD, MI, hyperlipidemia, hypertension, CVA, systolic CHF, ESRD on HD.   Discharge Planning: [place] for rehab with Palliative care service follow-up      Primary Diagnoses: Present on Admission: . Sepsis ([place])   I have reviewed the medical record, interviewed the patient and family, and examined the patient. The following aspects are pertinent.  Past Medical History:  Diagnosis Date  . Anemia of chronic disease   . Atherosclerosis   . Atrial fibrillation with rapid ventricular response (HCC)    chadsvasc at least 6; new onset versus paroxysmal with first or most recent episode on 09/10/18  . CAD (coronary artery disease)    2017 LHC, severe 1v disease, occluded pRCA, L-R collaterals, moderate LV dysfunction EF 30-35  . Diabetes mellitus with complication ([place])   . End stage renal disease on dialysis ([place])    Labile BP  . History of ventricular tachycardia    2017 polymorphic VT and cardiac arrest s/p angioplasty   . Hypercholesteremia   . Hyperlipidemia LDL goal <70   . Hypertension   . Labile blood pressure    on HD  . NSVT (nonsustained ventricular tachycardia) ([place])    08/2018 NSVT  . Peripheral arterial occlusive disease (  [place])    s/p L BKA, R AKA  . S/P AKA (above knee amputation) unilateral, right ([place])    08/2018  . S/P BKA (below knee amputation) unilateral, left ([place])    2015  . Stroke ([place])   . Systolic heart failure ([place])    2017 EF per LHC 30-35%   Social History   Socioeconomic History  . Marital status: Married    Spouse name: Not on file  . Number of children: Not on file  . Years of education: Not on file  . Highest education level: Not on file  Occupational History  . Not on file  Social Needs  . Financial resource strain: Not on file  . Food insecurity:    Worry: Patient refused    Inability: Patient refused  . Transportation needs:    Medical: No    Non-medical: No  Tobacco Use  . Smoking status: Never Smoker  .  Smokeless tobacco: Never Used  Substance and Sexual Activity  . Alcohol use: No  . Drug use: No  . Sexual activity: Not on file  Lifestyle  . Physical activity:    Days per week: Not on file    Minutes per session: Not on file  . Stress: Not at all  Relationships  . Social connections:    Talks on phone: More than three times a week    Gets together: Three times a week    Attends religious service: Not on file    Active member of club or organization: Not on file    Attends meetings of clubs or organizations: Not on file    Relationship status: Not on file  Other Topics Concern  . Not on file  Social History Narrative  . Not on file   Family History  Problem Relation Age of Onset  . Diabetes Father   . Prostate cancer Father    Scheduled Meds: . sodium chloride   Intravenous Once  . sodium chloride   Intravenous Once  . amiodarone  200 mg Oral Daily  . aspirin EC  81 mg Oral Daily  . cinacalcet  60 mg Oral Once per day on Mon Wed Fri  . ciprofloxacin-dexamethasone  4 drop Right EAR BID  . epoetin (EPOGEN/PROCRIT) injection  10,000 Units Intravenous Q M,W,F-HD  . feeding supplement (NEPRO CARB STEADY)  237 mL Oral BID BM  . feeding supplement (PRO-STAT SUGAR FREE 64)  30 mL Oral Daily  . ferrous sulfate  325 mg Oral TID WC  . gabapentin  100 mg Oral TID  . glipiZIDE  10 mg Oral Q breakfast  . insulin aspart  0-15 Units Subcutaneous TID WC  . insulin aspart  0-5 Units Subcutaneous QHS  . midodrine  5 mg Oral TID WC  . multivitamin  1 tablet Oral QHS  . pantoprazole  40 mg Oral Daily  . polyethylene glycol  17 g Oral BID  . senna-docusate  1 tablet Oral BID  . sevelamer carbonate  2,400 mg Oral TID WC  . sodium chloride flush  3 mL Intravenous Q12H  . sodium polystyrene  30 g Oral Once   Continuous Infusions: . sodium chloride 250 mL (09/24/18 1721)  . vancomycin Stopped (09/24/18 1259)   PRN Meds:.sodium chloride, acetaminophen, hydrALAZINE, lidocaine-prilocaine,  midodrine, ondansetron **OR** ondansetron (ZOFRAN) IV, oxyCODONE, sodium chloride flush Medications Prior to Admission:  Prior to Admission medications   Medication Sig Start Date End Date Taking? Authorizing Provider  acetaminophen (TYLENOL) 325 MG tablet  Take 650 mg by mouth every 6 (six) hours as needed.   Yes [provider]  Amino Acids-Protein Hydrolys (FEEDING SUPPLEMENT, PRO-STAT SUGAR FREE 64,) LIQD Take 30 mLs by mouth daily.   Yes [provider]  amiodarone (PACERONE) 200 MG tablet Take 1 tablet (200 mg total) by mouth daily. 09/15/18  Yes Sudini, Alveta Heimlich, MD  apixaban (ELIQUIS) 5 MG TABS tablet Take 1 tablet (5 mg total) by mouth 2 (two) times daily. 09/15/18  Yes Hillary Bow, MD  aspirin EC 81 MG tablet Take 81 mg by mouth daily.   Yes [provider]  b complex-vitamin c-folic acid (NEPHRO-VITE) 0.8 MG TABS tablet Take 1 tablet by mouth daily.   Yes [provider]  cinacalcet (SENSIPAR) 60 MG tablet Take 60 mg by mouth daily.    Yes [provider]  ferrous sulfate 325 (65 FE) MG EC tablet Take 325 mg by mouth 3 (three) times daily with meals.   Yes [provider]  gabapentin (NEURONTIN) 300 MG capsule Take 1 capsule (300 mg total) by mouth 3 (three) times daily. 09/18/18  Yes Kris Hartmann, NP  glipiZIDE (GLUCOTROL) 10 MG tablet Take 10 mg by mouth daily.  10/25/16  Yes [provider]  lidocaine-prilocaine (EMLA) cream Apply 1 application topically as needed (port access).   Yes [provider]  metoprolol tartrate (LOPRESSOR) 25 MG tablet Take 1 tablet (25 mg total) by mouth 2 (two) times daily. 09/15/18  Yes Sudini, Alveta Heimlich, MD  midodrine (PROAMATINE) 5 MG tablet Take 1 tablet (5 mg total) by mouth 3 (three) times daily with meals. 09/15/18  Yes Sudini, Alveta Heimlich, MD  omeprazole (PRILOSEC) 40 MG capsule Take 40 mg by mouth 2 (two) times daily.    Yes [provider]  oxyCODONE (OXY IR/ROXICODONE) 5 MG  immediate release tablet Take 1 tablet (5 mg total) by mouth every 6 (six) hours as needed for moderate pain or severe pain. 09/15/18  Yes Sudini, Alveta Heimlich, MD  polyethylene glycol (MIRALAX / GLYCOLAX) packet Take 17 g by mouth 2 (two) times daily.    Yes [provider]  senna-docusate (SENOKOT-S) 8.6-50 MG tablet Take 1 tablet by mouth 2 (two) times daily.   Yes [provider]  sevelamer carbonate (RENVELA) 800 MG tablet Take 2,400 mg by mouth 3 (three) times daily with meals.    Yes [provider]   No Known Allergies Review of Systems  Constitutional: Positive for activity change and fever.  Musculoskeletal: Positive for arthralgias.  Neurological: Positive for tremors and weakness.  All other systems reviewed and are negative.   Physical Exam Vitals signs and nursing note reviewed.  Constitutional:      General: He is awake.     Appearance: He is obese.     Comments: Chronically ill appearing   Cardiovascular:     Rate and Rhythm: Tachycardia present. Rhythm irregular.     Heart sounds: Normal heart sounds.     Comments: Bilateral BKA  Pulmonary:     Effort: Pulmonary effort is normal.     Breath sounds: Decreased breath sounds present.  Abdominal:     General: Bowel sounds are normal.     Palpations: Abdomen is soft.  Musculoskeletal:     Comments: Generalized weakness, s/p bilateral BKA   Skin:    General: Skin is warm and dry.     Findings: Bruising present.     Comments: Stage 2 ulcerations to sacrum, right stump clean, dry, intact,  staples present, no redness, drainage.   Neurological:     Mental Status: He is alert and oriented to person, place, and time.     Comments: Tearful at times, c/o right stump pain, rigors noted  Psychiatric:        Attention and Perception: Attention normal.        Mood and Affect: Affect is tearful.        Speech: Speech normal.        Behavior: Behavior is cooperative.        Thought Content: Thought content  normal.        Cognition and Memory: Cognition normal.        Judgment: Judgment normal.     Comments: Spanish speaking      Vital Signs: BP (!) 131/97 (BP Location: Right Arm)   Pulse 71   Temp 98.4 F (36.9 C) (Oral)   Resp 18   Ht _0  (1.778 m)   Wt 115.1 kg   SpO2 97%   BMI 36.41 kg/m  Pain Scale: 0-10 POSS *See Group Information*: 1-Acceptable,Awake and alert Pain Score: 6    SpO2: SpO2: 97 % O2 Device:SpO2: 97 % O2 Flow Rate: .   IO: Intake/output summary:   Intake/Output Summary (Last 24 hours) at 09/25/2018 1320 Last data filed at 09/24/2018 1555 Gross per 24 hour  Intake -  Output 0 ml  Net 0 ml    LBM: Last BM Date: 09/23/18 Baseline Weight: Weight: 102.1 kg Most recent weight: Weight: 115.1 kg     Palliative Assessment/Data: PPS 30%   Flowsheet Rows     Most Recent Value  Intake Tab  Referral Department  Hospitalist  Unit at Time of Referral  Med/Surg Unit  Palliative Care Primary Diagnosis  Sepsis/Infectious Disease  Date Notified  09/24/18  Palliative Care Type  Return patient Palliative Care  Reason for referral  Clarify Goals of Care  Date first seen by Palliative Care  09/25/18  # of days Palliative referral response time  1 Day(s)  Clinical Assessment  Psychosocial & Spiritual Assessment  Palliative Care Outcomes     Time In:  1215 Time Out: 1320 Time Total: 65 min  Greater than 50%  of this time was spent counseling and coordinating care related to the above assessment and plan.  Signed by:  Alda Lea, AGPCNP-BC Palliative Medicine Team  Phone: [phone] Fax: [phone] Pager: [phone] Amion: Bjorn Pippin    Please contact Palliative Medicine Team phone at [phone] for questions and concerns.  For individual provider: See Shea Evans

## 2018-09-25 NOTE — Procedures (Signed)
ELECTROENCEPHALOGRAM REPORT   Patient: [name]       Room #: 226A-AA EEG No. ID: [id number] Age: 57 y.o.        Sex: male Referring Physician: Mody Report Date:  09/25/2018        Interpreting Physician: Alexis Goodell  History: BELAL SCALLON is an 57 y.o. 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 One channel was dedicated to EKG recording.  The patient is in the awake and drowsy states.  Description:  The waking background activity consists of a low voltage, symmetrical, fairly well organized, 7 Hz theta activity, seen from the parieto-occipital and posterior temporal regions.  Low voltage fast activity, poorly organized, is seen anteriorly and is at times superimposed on more posterior regions.  A mixture of theta and alpha rhythms are seen from the central and temporal regions. The patient drowses with slowing to irregular, low voltage theta and beta activity.   Stage II sleep is not obtained. No epileptiform activity is noted.   Hyperventilation was not performed.  Intermittent photic stimulation was performed but failed to illicit any change in the tracing.    IMPRESSION: This is an abnormal EEG secondary to posterior background slowing.  This finding may be seen with a diffuse gray matter disturbance that is etiologically nonspecific, but may include a dementia, among other possibilities.  No epileptiform activity is noted.     Alexis Goodell, MD Neurology [phone] 09/25/2018, 5:28 PM

## 2018-09-25 NOTE — Progress Notes (Signed)
[name]: [name]    MR#:  [id number]  DATE OF BIRTH:  1961-04-09  SUBJECTIVE:   Wife at bedside.  She is worried about patient's tremors.  I do not see any tremors today on physical exam. He is doing well without any chest pain or shortness of breath.  He is not complaining of any ear pain.  REVIEW OF SYSTEMS:    Review of Systems  Constitutional:no fever, NOchills weight loss HENT: Negative for ear pain, nosebleeds, congestion, facial swelling, rhinorrhea, neck pain, neck stiffness and ear discharge.   Respiratory: Negative for cough, shortness of breath, wheezing  Cardiovascular: Negative for chest pain, palpitations and leg swelling.  Gastrointestinal: Negative for heartburn, abdominal pain, vomiting, diarrhea or consitpation Genitourinary: Negative for dysuria, urgency, frequency, hematuria Musculoskeletal: Negative for back pain or joint pain Neurological: Negative for dizziness, seizures, syncope, focal weakness,  numbness and headaches.  Hematological: Does not bruise/bleed easily.  Psychiatric/Behavioral: Negative for hallucinations, confusion, dysphoric mood    Tolerating Diet: yes      DRUG ALLERGIES:  No Known Allergies  VITALS:  Blood pressure (!) 112/55, pulse 68, temperature 97.7 F (36.5 C), temperature source Oral, resp. rate 18, height 5\' 10"  (1.778 m), weight 115.1 kg, SpO2 95 %.  PHYSICAL EXAMINATION:  Constitutional: Appears well-developed and well-nourished. No distress. HENT: Normocephalic. Marland Kitchen Oropharynx is clear and moist.  Eyes: Conjunctivae and EOM are normal. PERRLA, no scleral icterus.  Neck: Normal ROM. Neck supple. No JVD. No tracheal deviation. CVSrrr S1/S2 +, no murmurs, no gallops, no carotid bruit.  Pulmonary: Effort and breath sounds normal, no stridor, rhonchi, wheezes, rales.  Abdominal: Soft. BS +,  no distension, tenderness, rebound or guarding.  Musculoskeletal: Right  AKA left BKA neuro: Alert. CN 2-12 grossly intact. No focal deficits. Skin: Skin is warm and dry. No rash noted. Psychiatric: Normal mood and affect.      LABORATORY PANEL:   CBC Recent Labs  Lab 09/25/18 0523  WBC 13.5*  HGB 7.2*  HCT 24.4*  PLT 430*   ------------------------------------------------------------------------------------------------------------------  Chemistries  Recent Labs  Lab 09/25/18 0523  NA 135  K 4.9  CL 95*  CO2 28  GLUCOSE 208*  BUN 57*  CREATININE 5.69*  CALCIUM 9.1   ------------------------------------------------------------------------------------------------------------------  Cardiac Enzymes Recent Labs  Lab 09/23/18 0836 09/23/18 1449 09/23/18 1952  TROPONINI 0.26* 0.20* 0.18*   ------------------------------------------------------------------------------------------------------------------  RADIOLOGY:  No results found.   ASSESSMENT AND PLAN:   57 year old male with end-stage renal disease on hemodialysis, peripheral vascular disease with recent AKA, diabetes  who presented to the ER due to fever.  1.  Sepsis: Patient presented with fever and leukocytosis. Sepsis from unclear etiology with negative chest x-ray. Right ear with otitis media Cultures have been negative. ID and vascular consultation appreciated Continue cefepime, Cipro OTIC eardrops for otitis media and vancomycin  2.  Acute on chronic anemia: Patient is status post 2 unit PRBC Discussed with cardiology and we will discontinue Eliquis.   Continue ferrous sulfate Epogen if indicated during dialysis  3.  History of hypotension Continue midodrine before dialysis treatments  4.  Peripheral artery disease status post recent right above-knee amputation: Vascular surgery consultation appreciated.  Stump looks clean and intact and is not source of sepsis.   5.  Diabetes: Continue current human with ADA diet  6.  PAF: Continue amiodarone Currently in  normal sinus rhythm.  7.  End-stage renal disease on hemodialysis: Continue  dialysis as per schedule by nephrology  8.  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     9.  Chest pain: This pain is due to atrial fibrillation and anemia.  Patient ruled out for ACS. Cardiology consultation appreciated.   10.  Tremor: Nephrology place consultation for neurology as per the request of the family. Gabapentin dose reduced.  11.  Otitis media: Continue Cipro drops  Management plans discussed with the patient and he is in agreement.  CODE STATUS: Full  TOTAL TIME TAKING CARE OF THIS PATIENT: 24 minutes.   Physical therapy is recommending skilled nursing facility Palliative care consultation for goals of care. POSSIBLE D/C 2 days, DEPENDING ON CLINICAL CONDITION.   Dynasty Holquin M.D on 09/25/2018 at 10:24 AM  Between 7am to 6pm - Pager - [phone] After 6pm go to [email or web address] - password [email or web address] [facility]  Office  [phone]  CC: Primary care physician; [name], MD  Note: This dictation was prepared with Dragon dictation along with smaller phrase technology. Any transcriptional errors that result from this process are unintentional.

## 2018-09-25 NOTE — Progress Notes (Signed)
eeg completed ° °

## 2018-09-25 NOTE — Consult Note (Signed)
Reason for Consult: Referring Physician: Mody Sital  CC: Myoclonic jerking movements  HPI: [name] is an 57 y.o. male with past medical history of atrial fibrillation with RVR, end-stage renal disease on hemodialysis MWF, anemia of chronic disease, hyperlipidemia, hypertension, peripheral arterial occlusive disease status post bilateral BKA, Diabetes mellitus, CAD, and hyperparathyroidism presenting with chief complaints of fevers, chest pain and episodes of involuntary muscle jerking and twitching.  Patient is currently being treated for bacteremia of unknown source. Neurology consulted for evaluation of involuntary muscle jerking and twitching which started on 09/21/2018.  Patient and wife report that symptoms initially started with involuntary rhythmic shaking in his fingers and hands that sometimes occurs at rest and also noticeable when trying to make small movements. Patient's wife thinks that myoclonic jerks has worsened and now affecting his quality of life. He cannot hold an object or feed himself like he used to. He feels that myoclonic jerking movements are sometimes triggered by voluntary movements or intention to move in his torso, upper and lower limbs, and face particularly the muscles around the mouth and the eyelid.  He reports that anxiety stress or extreme tiredness worsens the myoclonus. Denies other associated symptoms of seizure-like activity, loss of sensation and weakness in extremities, sensorineural hearing loss, altered sensorium, rigidity, bradykinesia, or cog-wheeling.  Past Medical History:  Diagnosis Date  . Anemia of chronic disease   . Atherosclerosis   . Atrial fibrillation with rapid ventricular response (HCC)    chadsvasc at least 6; new onset versus paroxysmal with first or most recent episode on 09/10/18  . CAD (coronary artery disease)    2017 LHC, severe 1v disease, occluded pRCA, L-R collaterals, moderate LV dysfunction EF 30-35  . Diabetes mellitus with  complication ([place])   . End stage renal disease on dialysis ([place])    Labile BP  . History of ventricular tachycardia    2017 polymorphic VT and cardiac arrest s/p angioplasty   . Hypercholesteremia   . Hyperlipidemia LDL goal <70   . Hypertension   . Labile blood pressure    on HD  . NSVT (nonsustained ventricular tachycardia) ([place])    08/2018 NSVT  . Peripheral arterial occlusive disease (HCC)    s/p L BKA, R AKA  . S/P AKA (above knee amputation) unilateral, right ([place])    08/2018  . S/P BKA (below knee amputation) unilateral, left ([place])    2015  . Stroke ([place])   . Systolic heart failure ([place])    2017 EF per LHC 30-35%    Past Surgical History:  Procedure Laterality Date  . A/V FISTULAGRAM Left 01/09/2017   Procedure: A/V Fistulagram;  Surgeon: [name], MD;  Location: [place] [facility];  Service: Cardiovascular;  Laterality: Left;  . A/V FISTULAGRAM Left 06/06/2017   Procedure: A/V Fistulagram;  Surgeon: [name], MD;  Location: [place] [facility];  Service: Cardiovascular;  Laterality: Left;  . A/V FISTULAGRAM Left 08/15/2017   Procedure: A/V Fistulagram;  Surgeon: [name], MD;  Location: [place] [facility];  Service: Cardiovascular;  Laterality: Left;  . A/V FISTULAGRAM Left 01/22/2018   Procedure: A/V FISTULAGRAM;  Surgeon: [name], MD;  Location: [place] [facility];  Service: Cardiovascular;  Laterality: Left;  . A/V SHUNT INTERVENTION N/A 06/06/2017   Procedure: A/V SHUNT INTERVENTION;  Surgeon: [name], MD;  Location: [place] [facility];  Service: Cardiovascular;  Laterality: N/A;  . AMPUTATION Right 04/25/2016   Procedure: TOE  AMPUTATION;  Surgeon: Katha Cabal, MD;  Location: [facility];  Service: Vascular;  Laterality: Right;  . AMPUTATION Right 09/09/2018   Procedure: AMPUTATION ABOVE KNEE;  Surgeon: [name], MD;  Location: [facility];  Service: Vascular;  Laterality: Right;  . APPLICATION OF WOUND VAC Right 04/25/2016   Procedure:  APPLICATION OF WOUND VAC;  Surgeon: Katha Cabal, MD;  Location: [facility];  Service: Vascular;  Laterality: Right;  . AV FISTULA PLACEMENT Left 2015  . CARDIAC CATHETERIZATION N/A 04/30/2016   Procedure: Left Heart Cath and Coronary Angiography;  Surgeon: Wellington Hampshire, MD;  Location: [place] [facility];  Service: Cardiovascular;  Laterality: N/A;  . CATARACT EXTRACTION, BILATERAL    . LEG AMPUTATION BELOW KNEE Left   . LOWER EXTREMITY ANGIOGRAPHY Right 07/24/2018   Procedure: LOWER EXTREMITY ANGIOGRAPHY;  Surgeon: [name], MD;  Location: [place] [facility];  Service: Cardiovascular;  Laterality: Right;  . LOWER EXTREMITY ANGIOGRAPHY Right 08/13/2018   Procedure: LOWER EXTREMITY ANGIOGRAPHY;  Surgeon: [name], MD;  Location: [place] [facility];  Service: Cardiovascular;  Laterality: Right;  . LOWER EXTREMITY ANGIOGRAPHY Right 09/04/2018   Procedure: Lower Extremity Angiography;  Surgeon: [name], MD;  Location: [place] [facility];  Service: Cardiovascular;  Laterality: Right;  . PERIPHERAL VASCULAR CATHETERIZATION N/A 10/03/2015   Procedure: A/V Shuntogram/Fistulagram;  Surgeon: [name], MD;  Location: [place] [facility];  Service: Cardiovascular;  Laterality: N/A;  . PERIPHERAL VASCULAR CATHETERIZATION N/A 01/16/2016   Procedure: Abdominal Aortogram w/Lower Extremity;  Surgeon: [name], MD;  Location: [place] [facility];  Service: Cardiovascular;  Laterality: N/A;  . PERIPHERAL VASCULAR CATHETERIZATION  01/16/2016   Procedure: Lower Extremity Intervention;  Surgeon: [name], MD;  Location: [place] [facility];  Service: Cardiovascular;;  . PERIPHERAL VASCULAR CATHETERIZATION Right 04/27/2016   Procedure: Lower Extremity Angiography;  Surgeon: Katha Cabal, MD;  Location: [place] [facility];  Service: Cardiovascular;  Laterality: Right;  . PERIPHERAL VASCULAR CATHETERIZATION  04/27/2016   Procedure: Lower Extremity Intervention;  Surgeon: Katha Cabal, MD;  Location: [place] [facility];  Service: Cardiovascular;;  . TRANSMETATARSAL AMPUTATION Right 04/28/2016   Procedure: TRANSMETATARSAL AMPUTATION;  Surgeon: Albertine Patricia, DPM;  Location: [facility];  Service: Podiatry;  Laterality: Right;  . WOUND DEBRIDEMENT Right 04/25/2016   Procedure: DEBRIDEMENT WOUND;  Surgeon: Katha Cabal, MD;  Location: [facility];  Service: Vascular;  Laterality: Right;    Family History  Problem Relation Age of Onset  . Diabetes Father   . Prostate cancer Father     Social History:  reports that he has never smoked. He has never used smokeless tobacco. He reports that he does not drink alcohol or use drugs.  No Known Allergies  Medications:  I have reviewed the patient's current medications. Prior to Admission:  Medications Prior to Admission  Medication Sig Dispense Refill Last Dose  . acetaminophen (TYLENOL) 325 MG tablet Take 650 mg by mouth every 6 (six) hours as needed.   prn at prn  . Amino Acids-Protein Hydrolys (FEEDING SUPPLEMENT, PRO-STAT SUGAR FREE 64,) LIQD Take 30 mLs by mouth daily.   09/20/2018 at 2000  . amiodarone (PACERONE) 200 MG tablet Take 1 tablet (200 mg total) by mouth daily.   09/20/2018 at 2100  . apixaban (ELIQUIS) 5 MG TABS tablet Take 1 tablet (5 mg total) by mouth 2 (two) times daily. 60 tablet 0 09/21/2018 at 1400  .  aspirin EC 81 MG tablet Take 81 mg by mouth daily.   09/21/2018 at 1400  . b complex-vitamin c-folic acid (NEPHRO-VITE) 0.8 MG TABS tablet Take 1 tablet by mouth daily.   09/21/2018 at 1400  . cinacalcet (SENSIPAR) 60 MG tablet Take 60 mg by mouth daily.    09/21/2018 at 1400  . ferrous sulfate 325 (65 FE) MG EC tablet Take 325 mg by mouth 3 (three) times daily with meals.   09/21/2018 at 1300  . gabapentin (NEURONTIN) 300 MG capsule Take 1 capsule (300 mg total) by mouth 3 (three) times daily. 90 capsule 3 09/21/2018 at 1300  . glipiZIDE (GLUCOTROL) 10 MG tablet Take 10 mg by mouth daily.    09/20/2018 at  1800  . lidocaine-prilocaine (EMLA) cream Apply 1 application topically as needed (port access).   Past Week at Unknown time  . metoprolol tartrate (LOPRESSOR) 25 MG tablet Take 1 tablet (25 mg total) by mouth 2 (two) times daily. 30 tablet 0 09/21/2018 at 1400  . midodrine (PROAMATINE) 5 MG tablet Take 1 tablet (5 mg total) by mouth 3 (three) times daily with meals.   09/21/2018 at 1400  . omeprazole (PRILOSEC) 40 MG capsule Take 40 mg by mouth 2 (two) times daily.    09/21/2018 at 0430  . oxyCODONE (OXY IR/ROXICODONE) 5 MG immediate release tablet Take 1 tablet (5 mg total) by mouth every 6 (six) hours as needed for moderate pain or severe pain. 10 tablet 0 prn at prn  . polyethylene glycol (MIRALAX / GLYCOLAX) packet Take 17 g by mouth 2 (two) times daily.    09/21/2018 at 1400  . senna-docusate (SENOKOT-S) 8.6-50 MG tablet Take 1 tablet by mouth 2 (two) times daily.   09/21/2018 at 0900  . sevelamer carbonate (RENVELA) 800 MG tablet Take 2,400 mg by mouth 3 (three) times daily with meals.    09/21/2018 at 1330   Scheduled: . sodium chloride   Intravenous Once  . sodium chloride   Intravenous Once  . amiodarone  200 mg Oral Daily  . aspirin EC  81 mg Oral Daily  . cinacalcet  60 mg Oral Once per day on Mon Wed Fri  . ciprofloxacin-dexamethasone  4 drop Right EAR BID  . epoetin (EPOGEN/PROCRIT) injection  10,000 Units Intravenous Q M,W,F-HD  . feeding supplement (NEPRO CARB STEADY)  237 mL Oral BID BM  . feeding supplement (PRO-STAT SUGAR FREE 64)  30 mL Oral Daily  . ferrous sulfate  325 mg Oral TID WC  . gabapentin  100 mg Oral TID  . Gerhardt's butt cream   Topical QID  . glipiZIDE  10 mg Oral Q breakfast  . insulin aspart  0-15 Units Subcutaneous TID WC  . insulin aspart  0-5 Units Subcutaneous QHS  . midodrine  5 mg Oral TID WC  . multivitamin  1 tablet Oral QHS  . pantoprazole  40 mg Oral Daily  . polyethylene glycol  17 g Oral BID  . senna-docusate  1 tablet Oral BID  . sevelamer  carbonate  2,400 mg Oral TID WC  . sodium chloride flush  3 mL Intravenous Q12H  . sodium polystyrene  30 g Oral Once    ROS: History obtained from the patient   General ROS: negative for - chills, fatigue, fever, night sweats, weight gain or weight loss Psychological ROS: negative for - behavioral disorder, hallucinations, memory difficulties, mood swings or suicidal ideation Ophthalmic ROS: negative for - blurry vision, double vision, eye  pain or loss of vision ENT ROS: negative for - epistaxis, nasal discharge, oral lesions, sore throat, tinnitus or vertigo Allergy and Immunology ROS: negative for - hives or itchy/watery eyes Hematological and Lymphatic ROS: negative for - bleeding problems, bruising or swollen lymph nodes Endocrine ROS: negative for - galactorrhea, hair pattern changes, polydipsia/polyuria or temperature intolerance Respiratory ROS: negative for - cough, hemoptysis, shortness of breath or wheezing Cardiovascular ROS: negative for - chest pain, dyspnea on exertion, edema or irregular heartbeat Gastrointestinal ROS: negative for - abdominal pain, diarrhea, hematemesis, nausea/vomiting or stool incontinence Genito-Urinary ROS: negative for - dysuria, hematuria, incontinence or urinary frequency/urgency Musculoskeletal ROS: negative for - joint swelling or muscular weakness Neurological ROS: as noted in HPI Dermatological ROS: negative for rash and skin lesion changes  Physical Examination: Blood pressure (!) 112/55, pulse 68, temperature 97.7 F (36.5 C), temperature source Oral, resp. rate 18, height 5\' 10"  (1.778 m), weight 115.1 kg, SpO2 95 %.  HEENT-  Normocephalic, no lesions, without obvious abnormality.  Normal external eye and conjunctiva.  Normal TM's bilaterally.  Normal auditory canals and external ears. Normal external nose, mucus membranes and septum.  Normal pharynx. Cardiovascular- S1, S2 normal, pulses palpable throughout   Lungs- chest clear, no  wheezing, rales, normal symmetric air entry Abdomen- soft, non-tender; bowel sounds normal; no masses,  no organomegaly Extremities- Bilateral BKA Lymph-no adenopathy palpable Musculoskeletal-no joint tenderness, deformity or swelling Skin-warm and dry, no hyperpigmentation, vitiligo, or suspicious lesions  Neurological Exam   Mental Status: Alert, oriented, thought content appropriate.  Speech fluent without evidence of aphasia.  Able to follow 3 step commands without difficulty. Attention span and concentration seemed appropriate  Cranial Nerves: II: Discs flat bilaterally; Visual fields grossly normal, pupils equal, round, reactive to light and accommodation III,IV, VI: ptosis not present, extra-ocular motions intact bilaterally V,VII: mild right facial droop, facial light touch sensation intact VIII: hearing normal bilaterally IX,X: gag reflex present XI: bilateral shoulder shrug XII: midline tongue extension Motor: Right :  Upper extremity   4/5 without pronator drift      Left: Upper extremity   5/5 without pronator drift Right:   BKA                                        Left:BKA Tone and bulk:normal tone throughout; no atrophy noted Myoclonic jerking movements in left upper arm and face noted, occasionally generalized. Sensory: Pinprick and light touch intact bilaterally Deep Tendon Reflexes: 2+ and symmetric in the upper extremities Plantars: Bilateral AKA Cerebellar: Finger-to-nose testing intact bilaterally. Unable to perform heel to chin due to bilateral amputation Gait: not tested due to safety concerns  Data Reviewed  Laboratory Studies:   Basic Metabolic Panel: Recent Labs  Lab 09/21/18 1531 09/22/18 0838 09/23/18 0318 09/24/18 0512 09/25/18 0523  NA 132* 132* 135 134* 135  K 6.3* 5.9* 4.7 5.1 4.9  CL 93* 95* 95* 94* 95*  CO2 27 25 29 27 28   GLUCOSE 254* 158* 201* 148* 208*  BUN 61* 78* 50* 73* 57*  CREATININE 8.07* 8.93* 6.05* 7.96* 5.69*  CALCIUM  8.5* 8.2* 8.5* 9.2 9.1  PHOS  --  4.2  --   --   --     Liver Function Tests: Recent Labs  Lab 09/22/18 0838  ALBUMIN 2.8*   No results for input(s): LIPASE, AMYLASE in the last 168 hours. No results for  input(s): AMMONIA in the last 168 hours.  CBC: Recent Labs  Lab 09/22/18 0838  09/23/18 0318 09/24/18 0512 09/24/18 1020 09/24/18 1518 09/25/18 0523  WBC 19.4*  --  17.3* 15.4* 15.6*  --  13.5*  HGB 6.8*   < > 7.3* 6.7* 7.0* 7.9* 7.2*  HCT 23.3*   < > 24.8* 22.8* 23.8* 26.0* 24.4*  MCV 100.0  --  99.2 99.1 100.0  --  97.6  PLT 419*  --  421* 405* 429*  --  430*   < > = values in this interval not displayed.    Cardiac Enzymes: Recent Labs  Lab 09/22/18 2215 09/23/18 0318 09/23/18 0836 09/23/18 1449 09/23/18 1952  TROPONINI 0.18* 0.25* 0.26* 0.20* 0.18*    BNP: Invalid input(s): POCBNP  CBG: Recent Labs  Lab 09/24/18 0748 09/24/18 1344 09/24/18 1655 09/24/18 2258 09/25/18 0721  GLUCAP 119* 139* 272* 144* 172*    Microbiology: Results for orders placed or performed during the hospital encounter of 09/21/18  Blood Culture (routine x 2)     Status: None (Preliminary result)   Collection Time: 09/21/18  3:00 PM  Result Value Ref Range Status   Specimen Description BLOOD R AC  Final   Special Requests   Final    BOTTLES DRAWN AEROBIC AND ANAEROBIC Blood Culture results may not be optimal due to an excessive volume of blood received in culture bottles   Culture   Final    NO GROWTH 4 DAYS Performed at [facility], [address] [place], [place] [address]    Report Status PENDING  Incomplete  Blood Culture (routine x 2)     Status: None (Preliminary result)   Collection Time: 09/21/18  3:00 PM  Result Value Ref Range Status   Specimen Description BLOOD R HAND  Final   Special Requests   Final    BOTTLES DRAWN AEROBIC AND ANAEROBIC Blood Culture adequate volume   Culture   Final    NO GROWTH 4 DAYS Performed at [facility],  [address], [place], [place] [address]    Report Status PENDING  Incomplete  MRSA PCR Screening     Status: None   Collection Time: 09/21/18 11:36 PM  Result Value Ref Range Status   MRSA by PCR NEGATIVE NEGATIVE Final    Comment:        The GeneXpert MRSA Assay (FDA approved for NASAL specimens only), is one component of a comprehensive MRSA colonization surveillance program. It is not intended to diagnose MRSA infection nor to guide or monitor treatment for MRSA infections. Performed at [facility] [address], [address] [place], [place] [address]     Coagulation Studies: No results for input(s): LABPROT, INR in the last 72 hours.  Urinalysis: No results for input(s): COLORURINE, LABSPEC, PHURINE, GLUCOSEU, HGBUR, BILIRUBINUR, KETONESUR, PROTEINUR, UROBILINOGEN, NITRITE, LEUKOCYTESUR in the last 168 hours.  Invalid input(s): APPERANCEUR  Lipid Panel:     Component Value Date/Time   CHOL 137 09/10/2018 1803   TRIG 244 (H) 09/10/2018 1803   HDL 20 (L) 09/10/2018 1803   CHOLHDL 6.9 09/10/2018 1803   VLDL 49 (H) 09/10/2018 1803   LDLCALC 68 09/10/2018 1803    HgbA1C:  Lab Results  Component Value Date   HGBA1C 6.9 (H) 11/12/2017    Urine Drug Screen:  No results found for: LABOPIA, COCAINSCRNUR, LABBENZ, AMPHETMU, THCU, LABBARB  Alcohol Level: No results for input(s): ETH in the last 168 hours.  Other results: EKG: Atrial  fibrillation with rapid ventricular response Vent. rate 123 BPM PR interval * ms QRS duration 112 ms QT/QTc 348/498 ms P-R-T axes * 31 228.  Imaging: No results found.   Patient seen and examined.  Clinical course and management discussed.  Necessary edits performed.  I agree with the above.  Assessment and plan of care developed and discussed below.    Assessment/Plan: 57 y.o male with past medical history of atrial fibrillation with RVR, end-stage renal disease on hemodialysis MWF, anemia of chronic disease, hyperlipidemia,  hypertension, peripheral arterial occlusive disease status post bilateral BKA, Diabetes mellitus, CAD, and hyperparathyroidism presenting with chief complaints of fevers, chest pain and episodes of involuntary muscle jerking and twitching.  Multiple metabolic derangements and infection noted at admission.  Myoclonus noted on examination is likely multifactorial and related to all of these issues.  Patient and wife are very distressed about his myoclonus and although I feel that it will resolve, they will need to be patient while we address his medical issues.  The improvement in the myoclonus may also lag behind the improvement in many of these issues.   Would also discontinue Neurontin.   Will also rule out seizure.  Recommendations: 1.  D/C Neurontin 2.  Agree with continued addressing of medical issues 3.  EEG  09/25/2018, 11:49 AM   Alexis Goodell, MD Neurology [phone]  09/25/2018  2:58 PM

## 2018-09-25 NOTE — Progress Notes (Signed)
PT Cancellation Note  Patient Details Name: [name] MRN: [id number] DOB: 1961/03/03   Cancelled Treatment:    Reason Eval/Treat Not Completed: Patient at procedure or test/unavailable Interpreter requested and showed up as pt was getting taken for EEG.  Will try to organize a treatment later if time allows.    Kreg Shropshire, DPT 09/25/2018, 3:34 PM

## 2018-09-25 NOTE — Consult Note (Signed)
[facility] Nurse wound consult note Reason for Consult: bilateral partial thickness skin injuries secondary to friction and moisture. Patient and his wife are participating in session.  I request that their small dog be placed in its carrier while I am assessing the patient's wound and they comply, but state, "Its no problem with the dog." Wound type:friction/moisture Pressure Injury POA: N/A Measurement: Left buttock with 9cm x 2cm abrasion with 0.1cm depth.  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 He is chronically ill with multiorgan dysfunction. He prefer to sit in bed in the high-Fowler's position (nearly 90-degree) and while he can turn for my assessment, states that it is hard to breathe in that position.  He is wearing underwear from home that is tight-fitting. I teach patient (who speaks some English) and communicate through his wife that it is beneficial to have patient's skin affected by moisture and friction exposed to air. They reiterate the reasons this is a challenge.  They inquire about a cream and I inform them that I will be providing both a mattress with air-flow while in house as well as a cream (Gerhart's Butt Cream, a 1:1:1 zinc oxide:hydrocortisone:antifungal) to be applied 4 times daily. They appear to be happy with this news.  [facility] nursing team will not follow, but will remain available to this patient, the nursing and medical teams.  Please re-consult if needed. Thanks, Maudie Flakes, MSN, RN, [place], Arther Abbott  Pager# [phone]

## 2018-09-25 NOTE — Progress Notes (Signed)
[facility]  ROUNDING NOTE   Subjective:   Wife at bedside. History taken with assistance of Spanish Interpreter.   Hemodialysis treatment yesterday. Tolerated treatment well. PRBC 1 unit transfusion. However today patient states he had chest pain on hemodialysis.   Wife is concerned about jerking/rigors.   Objective:  Vital signs in last 24 hours:  Temp:  [97.7 F (36.5 C)-98.7 F (37.1 C)] 98.4 F (36.9 C) (12/12 1220) Pulse Rate:  [68-71] 71 (12/12 1220) Resp:  [18-20] 18 (12/12 0552) BP: (109-131)/(37-97) 131/97 (12/12 1220) SpO2:  [95 %-98 %] 97 % (12/12 1220)  Weight change:  Filed Weights   09/22/18 1355 09/24/18 0917 09/24/18 1314  Weight: 113.4 kg 115.4 kg 115.1 kg    Intake/Output: I/O last 3 completed shifts: In: 106.5 [I.V.:6.5; IV Piggyback:100] Out: 2094 [Other:1044]   Intake/Output this shift:  No intake/output data recorded.  Physical Exam: General: NAD,   Head: Normocephalic, atraumatic. Moist oral mucosal membranes  Eyes: Anicteric, PERRL  Neck: Supple, trachea midline  Lungs:  Clear to auscultation  Heart: irregular  Abdomen:  Soft, nontender, obese  Extremities:  bilateral amputations  Neurologic: Nonfocal, moving all four extremities  Skin: Stage 1 decubitus ulcer  Access: AVF    Basic Metabolic Panel: Recent Labs  Lab 09/21/18 1531 09/22/18 0838 09/23/18 0318 09/24/18 0512 09/25/18 0523  NA 132* 132* 135 134* 135  K 6.3* 5.9* 4.7 5.1 4.9  CL 93* 95* 95* 94* 95*  CO2 27 25 29 27 28   GLUCOSE 254* 158* 201* 148* 208*  BUN 61* 78* 50* 73* 57*  CREATININE 8.07* 8.93* 6.05* 7.96* 5.69*  CALCIUM 8.5* 8.2* 8.5* 9.2 9.1  PHOS  --  4.2  --   --   --     Liver Function Tests: Recent Labs  Lab 09/22/18 0838  ALBUMIN 2.8*   No results for input(s): LIPASE, AMYLASE in the last 168 hours. No results for input(s): AMMONIA in the last 168 hours.  CBC: Recent Labs  Lab 09/22/18 0838  09/23/18 0318 09/24/18 0512  09/24/18 1020 09/24/18 1518 09/25/18 0523  WBC 19.4*  --  17.3* 15.4* 15.6*  --  13.5*  HGB 6.8*   < > 7.3* 6.7* 7.0* 7.9* 7.2*  HCT 23.3*   < > 24.8* 22.8* 23.8* 26.0* 24.4*  MCV 100.0  --  99.2 99.1 100.0  --  97.6  PLT 419*  --  421* 405* 429*  --  430*   < > = values in this interval not displayed.    Cardiac Enzymes: Recent Labs  Lab 09/22/18 2215 09/23/18 0318 09/23/18 0836 09/23/18 1449 09/23/18 1952  TROPONINI 0.18* 0.25* 0.26* 0.20* 0.18*    BNP: Invalid input(s): POCBNP  CBG: Recent Labs  Lab 09/24/18 0748 09/24/18 1344 09/24/18 1655 09/24/18 2258 09/25/18 0721  GLUCAP 119* 139* 272* 144* 172*    Microbiology: Results for orders placed or performed during the hospital encounter of 09/21/18  Blood Culture (routine x 2)     Status: None (Preliminary result)   Collection Time: 09/21/18  3:00 PM  Result Value Ref Range Status   Specimen Description BLOOD R AC  Final   Special Requests   Final    BOTTLES DRAWN AEROBIC AND ANAEROBIC Blood Culture results may not be optimal due to an excessive volume of blood received in culture bottles   Culture   Final    NO GROWTH 4 DAYS Performed at [facility], [address] [place], [place]  [address]    Report Status PENDING  Incomplete  Blood Culture (routine x 2)     Status: None (Preliminary result)   Collection Time: 09/21/18  3:00 PM  Result Value Ref Range Status   Specimen Description BLOOD R HAND  Final   Special Requests   Final    BOTTLES DRAWN AEROBIC AND ANAEROBIC Blood Culture adequate volume   Culture   Final    NO GROWTH 4 DAYS Performed at [facility], [address], [place], [place] [address]    Report Status PENDING  Incomplete  MRSA PCR Screening     Status: None   Collection Time: 09/21/18 11:36 PM  Result Value Ref Range Status   MRSA by PCR NEGATIVE NEGATIVE Final    Comment:        The GeneXpert MRSA Assay (FDA approved for NASAL specimens only), is one  component of a comprehensive MRSA colonization surveillance program. It is not intended to diagnose MRSA infection nor to guide or monitor treatment for MRSA infections. Performed at [facility], [address] [place], [place] [address]     Coagulation Studies: No results for input(s): LABPROT, INR in the last 72 hours.  Urinalysis: No results for input(s): COLORURINE, LABSPEC, PHURINE, GLUCOSEU, HGBUR, BILIRUBINUR, KETONESUR, PROTEINUR, UROBILINOGEN, NITRITE, LEUKOCYTESUR in the last 72 hours.  Invalid input(s): APPERANCEUR    Imaging: No results found.   Medications:   . sodium chloride 250 mL (09/24/18 1721)  . vancomycin Stopped (09/24/18 1259)   . sodium chloride   Intravenous Once  . sodium chloride   Intravenous Once  . amiodarone  200 mg Oral Daily  . aspirin EC  81 mg Oral Daily  . cinacalcet  60 mg Oral Once per day on Mon Wed Fri  . ciprofloxacin-dexamethasone  4 drop Right EAR BID  . epoetin (EPOGEN/PROCRIT) injection  10,000 Units Intravenous Q M,W,F-HD  . feeding supplement (NEPRO CARB STEADY)  237 mL Oral BID BM  . feeding supplement (PRO-STAT SUGAR FREE 64)  30 mL Oral Daily  . ferrous sulfate  325 mg Oral TID WC  . gabapentin  100 mg Oral TID  . Gerhardt's butt cream   Topical QID  . glipiZIDE  10 mg Oral Q breakfast  . insulin aspart  0-15 Units Subcutaneous TID WC  . insulin aspart  0-5 Units Subcutaneous QHS  . midodrine  5 mg Oral TID WC  . multivitamin  1 tablet Oral QHS  . pantoprazole  40 mg Oral Daily  . polyethylene glycol  17 g Oral BID  . senna-docusate  1 tablet Oral BID  . sevelamer carbonate  2,400 mg Oral TID WC  . sodium chloride flush  3 mL Intravenous Q12H  . sodium polystyrene  30 g Oral Once   sodium chloride, acetaminophen, hydrALAZINE, lidocaine-prilocaine, midodrine, ondansetron **OR** ondansetron (ZOFRAN) IV, oxyCODONE, sodium chloride flush  Assessment/ Plan:  [name] is a 57 y.o. Hispanic  (Spanish speaking only) malewith end stage renal disease, peripheral vascular disease, hypertension, CVA, hyperlipidemia, diabetes mellitus type II who is admitted to [facility] 106.5 kg Left AVF  1. End Stage Renal Disease: Continue MWF schedule      2. Anemia of chronic kidney disease: status post PRBC transfusions. Hemoglobin 7.2 - EPO with HD treatment  3. Hypotension - Midodrine    4. Secondary Hyperparathyroidism:  - continue Renvela and Sensipar with meals.  - Cinacalcet three times weekly.  5. Ear pain: empiric ciprofloxacin/dexamethasone otic solution.   6. Chest pain with atrial fibrillation: with elevated cardiac enzymes thought to be demand ischemia - Appreciate cardiology  7. Sepsis:  Appreciate ID input. Outpatient cultures no growth.  - Vancomycin  8. Jerking - Consult neurology   LOS: 4 Pauline Pegues 12/12/20192:17 PM

## 2018-09-26 ENCOUNTER — Inpatient Hospital Stay: Payer: Medicare Other

## 2018-09-26 DIAGNOSIS — F4322 Adjustment disorder with anxiety: Secondary | ICD-10-CM

## 2018-09-26 DIAGNOSIS — L8990 Pressure ulcer of unspecified site, unspecified stage: Secondary | ICD-10-CM

## 2018-09-26 LAB — CBC
HCT: 25.1 % — ABNORMAL LOW (ref 39.0–52.0)
Hemoglobin: 7.3 g/dL — ABNORMAL LOW (ref 13.0–17.0)
MCH: 29 pg (ref 26.0–34.0)
MCHC: 29.1 g/dL — ABNORMAL LOW (ref 30.0–36.0)
MCV: 99.6 fL (ref 80.0–100.0)
Platelets: 440 10*3/uL — ABNORMAL HIGH (ref 150–400)
RBC: 2.52 MIL/uL — ABNORMAL LOW (ref 4.22–5.81)
RDW: 16.6 % — ABNORMAL HIGH (ref 11.5–15.5)
WBC: 15.2 10*3/uL — ABNORMAL HIGH (ref 4.0–10.5)
nRBC: 0.3 % — ABNORMAL HIGH (ref 0.0–0.2)

## 2018-09-26 LAB — GLUCOSE, CAPILLARY
Glucose-Capillary: 197 mg/dL — ABNORMAL HIGH (ref 70–99)
Glucose-Capillary: 208 mg/dL — ABNORMAL HIGH (ref 70–99)
Glucose-Capillary: 190 mg/dL — ABNORMAL HIGH (ref 70–99)
Glucose-Capillary: 240 mg/dL — ABNORMAL HIGH (ref 70–99)

## 2018-09-26 LAB — BASIC METABOLIC PANEL
Anion gap: 12 (ref 5–15)
BUN: 78 mg/dL — ABNORMAL HIGH (ref 6–20)
CO2: 27 mmol/L (ref 22–32)
Calcium: 9.7 mg/dL (ref 8.9–10.3)
Chloride: 94 mmol/L — ABNORMAL LOW (ref 98–111)
Creatinine, Ser: 7.02 mg/dL — ABNORMAL HIGH (ref 0.61–1.24)
GFR calc Af Amer: 9 mL/min — ABNORMAL LOW (ref >60)
GFR calc non Af Amer: 8 mL/min — ABNORMAL LOW (ref >60)
Glucose, Bld: 235 mg/dL — ABNORMAL HIGH (ref 70–99)
Potassium: 5.7 mmol/L — ABNORMAL HIGH (ref 3.5–5.1)
Sodium: 133 mmol/L — ABNORMAL LOW (ref 135–145)

## 2018-09-26 LAB — CULTURE, BLOOD (ROUTINE X 2)
Culture: NO GROWTH
Culture: NO GROWTH
Special Requests: ADEQUATE

## 2018-09-26 LAB — VANCOMYCIN, TROUGH: Vancomycin Tr: 18 ug/mL (ref 15–20)

## 2018-09-26 MED ORDER — AMIODARONE HCL 200 MG PO TABS
400.0000 mg | ORAL_TABLET | Freq: Two times a day (BID) | ORAL | Status: DC
  Administered 2018-09-26 – 2018-09-28 (×4): 400 mg via ORAL
  Filled 2018-09-26 (×5): qty 2

## 2018-09-26 MED ORDER — CHLORHEXIDINE GLUCONATE CLOTH 2 % EX PADS
6.0000 | MEDICATED_PAD | Freq: Every day | CUTANEOUS | Status: DC
  Administered 2018-09-26: 6 via TOPICAL

## 2018-09-26 MED ORDER — IOPAMIDOL (ISOVUE-300) INJECTION 61%
100.0000 mL | Freq: Once | INTRAVENOUS | Status: AC | PRN
  Administered 2018-09-26: 100 mL via INTRAVENOUS

## 2018-09-26 MED ORDER — IOPAMIDOL (ISOVUE-300) INJECTION 61%: 15.0000 mL | INTRAVENOUS | Status: AC

## 2018-09-26 NOTE — Progress Notes (Signed)
This note also relates to the following rows which could not be included: Resp - Cannot attach notes to unvalidated device data BP - Cannot attach notes to unvalidated device data  Hd completed  

## 2018-09-26 NOTE — Consult Note (Signed)
[facility] Face-to-Face Psychiatry Consult   Reason for Consult: Consult for this 57 year old man on dialysis with multiple medical problems including sepsis.  Concern regarding depression. Referring Physician: Mody Patient Identification: ARVIN ABELLO MRN:  [id number] Principal Diagnosis: Sepsis [facility]) Diagnosis:  Principal Problem:   Sepsis ([place]) Active Problems:   Paroxysmal atrial fibrillation ([place])   Pressure injury of skin   Total Time spent with patient: 1 hour  Subjective:   PHIL MICHELS is a 57 y.o. male patient admitted with "I feel like I am going to throw up".  HPI: Patient was seen with the assistance of a hospital provided Spanish language interpreter.  57 year old man on hemodialysis with multiple medical problems now showing signs of sepsis.  Question raised about depression.  Spoke with the patient and his wife.  Both of them denied having raised any concerns about depression.  Patient is alert and oriented.  Able to describe the basic reason for being in the hospital and has some understanding of his medical situation.  Currently complaining of nausea ever since having his CT scan today.  Feeling rather uncomfortable this evening but still able to participate in some of the interview.  Describes himself as feeling very sick.  Does not necessarily feel depressed.  Interrupted sleep because of medical problems.  Patient is able to articulate positive things in his life and a feeling of looking forward to getting well and getting out of the hospital.  Denies hopelessness.  Denies any suicidal ideation.  Not on any psychiatric medicine.  Social history: Patient was here with his wife and his dog.  Appears to have a close bond with both of them and with other family members.  Medical history: Hemodialysis diabetes status post amputation multiple medical problems  Substance abuse history: No active problems  Past Psychiatric History: Denies any past psychiatric history.  Never seen a  psychiatrist or mental health provider.  Never been on psychiatric medicine.  No history of suicide attempts or violence  Risk to Self:   Risk to Others:   Prior Inpatient Therapy:   Prior Outpatient Therapy:    Past Medical History:  Past Medical History:  Diagnosis Date  . Anemia of chronic disease   . Atherosclerosis   . Atrial fibrillation with rapid ventricular response (HCC)    chadsvasc at least 6; new onset versus paroxysmal with first or most recent episode on 09/10/18  . CAD (coronary artery disease)    2017 LHC, severe 1v disease, occluded pRCA, L-R collaterals, moderate LV dysfunction EF 30-35  . Diabetes mellitus with complication ([place])   . End stage renal disease on dialysis ([place])    Labile BP  . History of ventricular tachycardia    2017 polymorphic VT and cardiac arrest s/p angioplasty   . Hypercholesteremia   . Hyperlipidemia LDL goal <70   . Hypertension   . Labile blood pressure    on HD  . NSVT (nonsustained ventricular tachycardia) ([place])    08/2018 NSVT  . Peripheral arterial occlusive disease (HCC)    s/p L BKA, R AKA  . S/P AKA (above knee amputation) unilateral, right ([place])    08/2018  . S/P BKA (below knee amputation) unilateral, left ([place])    2015  . Stroke ([place])   . Systolic heart failure ([place])    2017 EF per LHC 30-35%    Past Surgical History:  Procedure Laterality Date  . A/V FISTULAGRAM Left 01/09/2017   Procedure: A/V Fistulagram;  Surgeon: Erskine Squibb  Lucky Cowboy, MD;  Location: [place] [facility];  Service: Cardiovascular;  Laterality: Left;  . A/V FISTULAGRAM Left 06/06/2017   Procedure: A/V Fistulagram;  Surgeon: [name], MD;  Location: [place] [facility];  Service: Cardiovascular;  Laterality: Left;  . A/V FISTULAGRAM Left 08/15/2017   Procedure: A/V Fistulagram;  Surgeon: [name], MD;  Location: [place] [facility];  Service: Cardiovascular;  Laterality: Left;  . A/V FISTULAGRAM Left 01/22/2018   Procedure: A/V FISTULAGRAM;  Surgeon:  [name], MD;  Location: [place] [facility];  Service: Cardiovascular;  Laterality: Left;  . A/V SHUNT INTERVENTION N/A 06/06/2017   Procedure: A/V SHUNT INTERVENTION;  Surgeon: [name], MD;  Location: [place] [facility];  Service: Cardiovascular;  Laterality: N/A;  . AMPUTATION Right 04/25/2016   Procedure: TOE AMPUTATION;  Surgeon: Katha Cabal, MD;  Location: [facility];  Service: Vascular;  Laterality: Right;  . AMPUTATION Right 09/09/2018   Procedure: AMPUTATION ABOVE KNEE;  Surgeon: [name], MD;  Location: [facility];  Service: Vascular;  Laterality: Right;  . APPLICATION OF WOUND VAC Right 04/25/2016   Procedure: APPLICATION OF WOUND VAC;  Surgeon: Katha Cabal, MD;  Location: [facility];  Service: Vascular;  Laterality: Right;  . AV FISTULA PLACEMENT Left 2015  . CARDIAC CATHETERIZATION N/A 04/30/2016   Procedure: Left Heart Cath and Coronary Angiography;  Surgeon: [place] Hampshire, MD;  Location: [place] [facility];  Service: Cardiovascular;  Laterality: N/A;  . CATARACT EXTRACTION, BILATERAL    . LEG AMPUTATION BELOW KNEE Left   . LOWER EXTREMITY ANGIOGRAPHY Right 07/24/2018   Procedure: LOWER EXTREMITY ANGIOGRAPHY;  Surgeon: [name], MD;  Location: [place] [facility];  Service: Cardiovascular;  Laterality: Right;  . LOWER EXTREMITY ANGIOGRAPHY Right 08/13/2018   Procedure: LOWER EXTREMITY ANGIOGRAPHY;  Surgeon: [name], MD;  Location: [place] [facility];  Service: Cardiovascular;  Laterality: Right;  . LOWER EXTREMITY ANGIOGRAPHY Right 09/04/2018   Procedure: Lower Extremity Angiography;  Surgeon: [name], MD;  Location: [place] [facility];  Service: Cardiovascular;  Laterality: Right;  . PERIPHERAL VASCULAR CATHETERIZATION N/A 10/03/2015   Procedure: A/V Shuntogram/Fistulagram;  Surgeon: [name], MD;  Location: [place] [facility];  Service: Cardiovascular;  Laterality: N/A;  . PERIPHERAL VASCULAR CATHETERIZATION N/A 01/16/2016    Procedure: Abdominal Aortogram w/Lower Extremity;  Surgeon: [name], MD;  Location: [place] [facility];  Service: Cardiovascular;  Laterality: N/A;  . PERIPHERAL VASCULAR CATHETERIZATION  01/16/2016   Procedure: Lower Extremity Intervention;  Surgeon: [name], MD;  Location: [place] [facility];  Service: Cardiovascular;;  . PERIPHERAL VASCULAR CATHETERIZATION Right 04/27/2016   Procedure: Lower Extremity Angiography;  Surgeon: Katha Cabal, MD;  Location: [place] [facility];  Service: Cardiovascular;  Laterality: Right;  . PERIPHERAL VASCULAR CATHETERIZATION  04/27/2016   Procedure: Lower Extremity Intervention;  Surgeon: Katha Cabal, MD;  Location: [place] [facility];  Service: Cardiovascular;;  . TRANSMETATARSAL AMPUTATION Right 04/28/2016   Procedure: TRANSMETATARSAL AMPUTATION;  Surgeon: Albertine Patricia, DPM;  Location: [facility];  Service: Podiatry;  Laterality: Right;  . WOUND DEBRIDEMENT Right 04/25/2016   Procedure: DEBRIDEMENT WOUND;  Surgeon: Katha Cabal, MD;  Location: [facility];  Service: Vascular;  Laterality: Right;   Family History:  Family History  Problem Relation Age of Onset  . Diabetes Father   . Prostate cancer Father    Family Psychiatric  History: None Social  History:  Social History   Substance and Sexual Activity  Alcohol Use No     Social History   Substance and Sexual Activity  Drug Use No    Social History   Socioeconomic History  . Marital status: Married    Spouse name: Not on file  . Number of children: Not on file  . Years of education: Not on file  . Highest education level: Not on file  Occupational History  . Not on file  Social Needs  . Financial resource strain: Not on file  . Food insecurity:    Worry: Patient refused    Inability: Patient refused  . Transportation needs:    Medical: No    Non-medical: No  Tobacco Use  . Smoking status: Never Smoker  . Smokeless tobacco: Never Used  Substance and Sexual  Activity  . Alcohol use: No  . Drug use: No  . Sexual activity: Not on file  Lifestyle  . Physical activity:    Days per week: Not on file    Minutes per session: Not on file  . Stress: Not at all  Relationships  . Social connections:    Talks on phone: More than three times a week    Gets together: Three times a week    Attends religious service: Not on file    Active member of club or organization: Not on file    Attends meetings of clubs or organizations: Not on file    Relationship status: Not on file  Other Topics Concern  . Not on file  Social History Narrative  . Not on file   Additional Social History:    Allergies:  No Known Allergies  Labs:  Results for orders placed or performed during the hospital encounter of 09/21/18 (from the past 48 hour(s))  Glucose, capillary     Status: Abnormal   Collection Time: 09/24/18 10:58 PM  Result Value Ref Range   Glucose-Capillary 144 (H) 70 - 99 mg/dL   Comment 1 Notify RN   Basic metabolic panel     Status: Abnormal   Collection Time: 09/25/18  5:23 AM  Result Value Ref Range   Sodium 135 135 - 145 mmol/L   Potassium 4.9 3.5 - 5.1 mmol/L   Chloride 95 (L) 98 - 111 mmol/L   CO2 28 22 - 32 mmol/L   Glucose, Bld 208 (H) 70 - 99 mg/dL   BUN 57 (H) 6 - 20 mg/dL   Creatinine, Ser 5.69 (H) 0.61 - 1.24 mg/dL   Calcium 9.1 8.9 - 10.3 mg/dL   GFR calc non Af Amer 10 (L) >60 mL/min   GFR calc Af Amer 12 (L) >60 mL/min   Anion gap 12 5 - 15    Comment: Performed at [facility], [address] [place], [place] [address]  CBC     Status: Abnormal   Collection Time: 09/25/18  5:23 AM  Result Value Ref Range   WBC 13.5 (H) 4.0 - 10.5 K/uL   RBC 2.50 (L) 4.22 - 5.81 MIL/uL   Hemoglobin 7.2 (L) 13.0 - 17.0 g/dL   HCT 24.4 (L) 39.0 - 52.0 %   MCV 97.6 80.0 - 100.0 fL   MCH 28.8 26.0 - 34.0 pg   MCHC 29.5 (L) 30.0 - 36.0 g/dL   RDW 17.2 (H) 11.5 - 15.5 %   Platelets 430 (H) 150 - 400 K/uL   nRBC 0.2 0.0 - 0.2 %     Comment:  Performed at [facility], [address] [place], [place] [address]  Glucose, capillary     Status: Abnormal   Collection Time: 09/25/18  7:21 AM  Result Value Ref Range   Glucose-Capillary 172 (H) 70 - 99 mg/dL  Glucose, capillary     Status: Abnormal   Collection Time: 09/25/18 12:19 PM  Result Value Ref Range   Glucose-Capillary 197 (H) 70 - 99 mg/dL  Glucose, capillary     Status: Abnormal   Collection Time: 09/25/18  5:20 PM  Result Value Ref Range   Glucose-Capillary 177 (H) 70 - 99 mg/dL  Glucose, capillary     Status: Abnormal   Collection Time: 09/25/18  9:33 PM  Result Value Ref Range   Glucose-Capillary 201 (H) 70 - 99 mg/dL  Vancomycin, trough     Status: None   Collection Time: 09/26/18  3:27 AM  Result Value Ref Range   Vancomycin Tr 18 15 - 20 ug/mL    Comment: Performed at [facility], [address] [place], [place] [address]  CBC     Status: Abnormal   Collection Time: 09/26/18  3:27 AM  Result Value Ref Range   WBC 15.2 (H) 4.0 - 10.5 K/uL   RBC 2.52 (L) 4.22 - 5.81 MIL/uL   Hemoglobin 7.3 (L) 13.0 - 17.0 g/dL   HCT 25.1 (L) 39.0 - 52.0 %   MCV 99.6 80.0 - 100.0 fL   MCH 29.0 26.0 - 34.0 pg   MCHC 29.1 (L) 30.0 - 36.0 g/dL   RDW 16.6 (H) 11.5 - 15.5 %   Platelets 440 (H) 150 - 400 K/uL   nRBC 0.3 (H) 0.0 - 0.2 %    Comment: Performed at [facility], [address] [place], [place] [address]  Basic metabolic panel     Status: Abnormal   Collection Time: 09/26/18  3:27 AM  Result Value Ref Range   Sodium 133 (L) 135 - 145 mmol/L   Potassium 5.7 (H) 3.5 - 5.1 mmol/L   Chloride 94 (L) 98 - 111 mmol/L   CO2 27 22 - 32 mmol/L   Glucose, Bld 235 (H) 70 - 99 mg/dL   BUN 78 (H) 6 - 20 mg/dL   Creatinine, Ser 7.02 (H) 0.61 - 1.24 mg/dL   Calcium 9.7 8.9 - 10.3 mg/dL   GFR calc non Af Amer 8 (L) >60 mL/min   GFR calc Af Amer 9 (L) >60 mL/min   Anion gap 12 5 - 15    Comment: Performed at [facility], [address] [place],  [address]  Glucose, capillary     Status: Abnormal   Collection Time: 09/26/18  7:37 AM  Result Value Ref Range   Glucose-Capillary 208 (H) 70 - 99 mg/dL  Glucose, capillary     Status: Abnormal   Collection Time: 09/26/18  6:07 PM  Result Value Ref Range   Glucose-Capillary 190 (H) 70 - 99 mg/dL    Current Facility-Administered Medications  Medication Dose Route Frequency Provider Last Rate Last Dose  . 0.9 %  sodium chloride infusion (Manually program via Guardrails IV Fluids)   Intravenous Once Kolluru, Sarath, MD      . 0.9 %  sodium chloride infusion (Manually program via Guardrails IV Fluids)   Intravenous Once Mody, Sital, MD      . 0.9 %  sodium chloride infusion  250 mL Intravenous PRN Salary, Avel Peace, MD   Stopped at 09/24/18 1955  .  acetaminophen (TYLENOL) tablet 650 mg  650 mg Oral Q6H PRN Loney Hering D, MD   650 mg at 09/26/18 2017  . amiodarone (PACERONE) tablet 400 mg  400 mg Oral BID Kathlyn Sacramento A, MD      . aspirin EC tablet 81 mg  81 mg Oral Daily Salary, Montell D, MD   81 mg at 09/26/18 0931  . Chlorhexidine Gluconate Cloth 2 % PADS 6 each  6 each Topical Q0600 Lavonia Dana, MD   6 each at 09/26/18 0845  . cinacalcet (SENSIPAR) tablet 60 mg  60 mg Oral Once per day on Mon Wed Fri Lavonia Dana, MD   60 mg at 09/26/18 0930  . ciprofloxacin-dexamethasone (CIPRODEX) 0.3-0.1 % OTIC (EAR) suspension 4 drop  4 drop Right EAR BID Bettey Costa, MD   4 drop at 09/26/18 0932  . epoetin alfa (EPOGEN,PROCRIT) injection 10,000 Units  10,000 Units Intravenous Q M,W,F-HD Lavonia Dana, MD   10,000 Units at 09/26/18 1313  . feeding supplement (NEPRO CARB STEADY) liquid 237 mL  237 mL Oral BID BM Bettey Costa, MD   237 mL at 09/26/18 0952  . feeding supplement (PRO-STAT SUGAR FREE 64) liquid 30 mL  30 mL Oral Daily Salary, Montell D, MD   30 mL at 09/26/18 0952  . ferrous sulfate tablet 325 mg  325 mg Oral TID WC Salary, Montell D, MD   325 mg  at 09/26/18 1821  . Gerhardt's butt cream   Topical QID Bettey Costa, MD      . glipiZIDE (GLUCOTROL) tablet 10 mg  10 mg Oral Q breakfast Salary, Montell D, MD   10 mg at 09/26/18 0931  . hydrALAZINE (APRESOLINE) injection 10 mg  10 mg Intravenous Q4H PRN Salary, Montell D, MD      . insulin aspart (novoLOG) injection 0-15 Units  0-15 Units Subcutaneous TID WC Salary, Avel Peace, MD   3 Units at 09/26/18 1820  . insulin aspart (novoLOG) injection 0-5 Units  0-5 Units Subcutaneous QHS Loney Hering D, MD   2 Units at 09/25/18 2145  . lidocaine-prilocaine (EMLA) cream 1 application  1 application Topical PRN Salary, Montell D, MD      . metoCLOPramide (REGLAN) injection 5 mg  5 mg Intravenous Q6H PRN Lance Coon, MD   5 mg at 09/26/18 0128  . metoprolol tartrate (LOPRESSOR) injection 5 mg  5 mg Intravenous Q1H PRN Wieting, Richard, MD      . metoprolol tartrate (LOPRESSOR) tablet 12.5 mg  12.5 mg Oral BID Loletha Grayer, MD   12.5 mg at 09/25/18 1824  . midodrine (PROAMATINE) tablet 10 mg  10 mg Oral Q6H PRN Kolluru, Sarath, MD   10 mg at 09/24/18 1054  . midodrine (PROAMATINE) tablet 5 mg  5 mg Oral TID WC Salary, Montell D, MD   5 mg at 09/26/18 1819  . multivitamin (RENA-VIT) tablet 1 tablet  1 tablet Oral QHS Loney Hering D, MD   1 tablet at 09/25/18 2132  . ondansetron (ZOFRAN) tablet 4 mg  4 mg Oral Q6H PRN Salary, Montell D, MD       Or  . ondansetron (ZOFRAN) injection 4 mg  4 mg Intravenous Q6H PRN Salary, Montell D, MD   4 mg at 09/26/18 1951  . oxyCODONE (Oxy IR/ROXICODONE) immediate release tablet 5 mg  5 mg Oral Q8H PRN Salary, Montell D, MD   5 mg at 09/24/18 2248  . pantoprazole (PROTONIX) EC tablet 40 mg  40 mg  Oral Daily Salary, Montell D, MD   40 mg at 09/26/18 0931  . polyethylene glycol (MIRALAX / GLYCOLAX) packet 17 g  17 g Oral BID Loney Hering D, MD   17 g at 09/26/18 1819  . senna-docusate (Senokot-S) tablet 1 tablet  1 tablet Oral BID Gorden Harms, MD   1  tablet at 09/26/18 0931  . sevelamer carbonate (RENVELA) tablet 2,400 mg  2,400 mg Oral TID WC Salary, Montell D, MD   2,400 mg at 09/26/18 1819  . sodium chloride flush (NS) 0.9 % injection 3 mL  3 mL Intravenous Q12H Salary, Montell D, MD   3 mL at 09/26/18 0941  . sodium chloride flush (NS) 0.9 % injection 3 mL  3 mL Intravenous PRN Salary, Montell D, MD      . sodium polystyrene (KAYEXALATE) 15 GM/60ML suspension 30 g  30 g Oral Once Salary, Montell D, MD      . vancomycin (VANCOCIN) IVPB 1000 mg/200 mL premix  1,000 mg Intravenous Q M,W,F-HD Lu Duffel, RPH 200 mL/hr at 09/26/18 7017      Musculoskeletal: Strength & Muscle Tone: decreased Gait & Station: unable to stand Patient leans: N/A  Psychiatric Specialty Exam: Physical Exam  Nursing note and vitals reviewed. Constitutional: He appears well-developed.  HENT:  Head: Normocephalic and atraumatic.  Eyes: Pupils are equal, round, and reactive to light. Conjunctivae are normal.  Neck: Normal range of motion.  Cardiovascular: Normal heart sounds.  Respiratory: He is in respiratory distress.  GI: Soft. He exhibits distension.  Neurological: He is alert.  Skin: Skin is warm and dry.  Psychiatric: Judgment normal. His affect is blunt. His speech is delayed. He is slowed. Thought content is not paranoid. Cognition and memory are impaired. He expresses no homicidal and no suicidal ideation.    Review of Systems  Constitutional: Negative.   HENT: Negative.   Eyes: Negative.   Respiratory: Negative.   Cardiovascular: Negative.   Gastrointestinal: Positive for abdominal pain and nausea.  Musculoskeletal: Negative.   Skin: Negative.   Neurological: Negative.   Psychiatric/Behavioral: Negative for depression, hallucinations, memory loss, substance abuse and suicidal ideas. The patient is not nervous/anxious and does not have insomnia.     Blood pressure 105/60, pulse 73, temperature 98.8 F (37.1 C), temperature source  Oral, resp. rate 18, height 5\' 10"  (1.778 m), weight 115.1 kg, SpO2 97 %.Body mass index is 36.41 kg/m.  General Appearance: Casual  Eye Contact:  Fair  Speech:  Slow  Volume:  Decreased  Mood:  Dysphoric  Affect:  Congruent  Thought Process:  Coherent  Orientation:  Full (Time, Place, and Person)  Thought Content:  Logical  Suicidal Thoughts:  No  Homicidal Thoughts:  No  Memory:  Immediate;   Fair Recent;   Fair Remote;   Fair  Judgement:  Fair  Insight:  Fair  Psychomotor Activity:  Decreased  Concentration:  Concentration: Fair  Recall:  [employer] of Knowledge:  Fair  Language:  Good  Akathisia:  No  Handed:  Right  AIMS (if indicated):     Assets:  Desire for Improvement Housing  ADL's:  Impaired  Cognition:  Impaired,  Mild  Sleep:        Treatment Plan Summary: Daily contact with patient to assess and evaluate symptoms and progress in treatment, Medication management and Plan 57 year old man who is extremely sick multiple medical problems including sepsis.  All of this likely to cause fatigue and general malaise.  Sometimes can be difficult to determine depression as separate from these other feelings.  In this case the patient is not reporting feeling specifically depressed.  No sign of suicidality.  He indicates that he is very willing to continue to cooperate with treatment and is optimistic that he will get better.  There is nothing here that I can see that would suggest major depression.  Particularly given his multiple other medical problems and complications I think the risk of adding medication for depression is higher than average.  Would not add any medication.  Explained all this to the patient and family.  Will follow if needed.  Disposition: No evidence of imminent risk to self or others at present.   Patient does not meet criteria for psychiatric inpatient admission. Supportive therapy provided about ongoing stressors.  Alethia Berthold, MD 09/26/2018 8:46  PM

## 2018-09-26 NOTE — Care Management (Signed)
Order placed for electric wheelchair and notified Corene Cornea from [facility].  RNCM updated patient's wife via [place] interpreter.  Authorization process will be initiated, and [facility] will have to follow up.  Electric WC can not be delviered to home while patient is at [facility]  Wife request RNCM to also order bilateral prosthetics.  RNCM notified wife that this is not something that could be arranged from the hospital, and would have to be discuss with their outpatient MD

## 2018-09-26 NOTE — Progress Notes (Signed)
[facility]: [name]    MR#:  [id number]  DATE OF BIRTH:  30-Nov-1960  SUBJECTIVE:   Wife at bedside today concerned about tremors. Had fever last night  REVIEW OF SYSTEMS:    Review of Systems  Constitutional:++ fever, NOchills weight loss HENT: Negative for ear pain, nosebleeds, congestion, facial swelling, rhinorrhea, neck pain, neck stiffness and ear discharge.   Respiratory: Negative for cough, shortness of breath, wheezing  Cardiovascular: Negative for chest pain, palpitations and leg swelling.  Gastrointestinal: Negative for heartburn, abdominal pain, vomiting, diarrhea or consitpation Genitourinary: Negative for dysuria, urgency, frequency, hematuria Musculoskeletal: Negative for back pain or joint pain Neurological: Negative for dizziness, seizures, syncope, focal weakness,  numbness and headaches.  Hematological: Does not bruise/bleed easily.  Psychiatric/Behavioral: Negative for hallucinations, confusion, dysphoric mood    Tolerating Diet: yes      DRUG ALLERGIES:  No Known Allergies  VITALS:  Blood pressure 98/72, pulse 80, temperature 99 F (37.2 C), temperature source Oral, resp. rate (!) 22, height 5\' 10"  (1.778 m), weight 115.1 kg, SpO2 98 %.  PHYSICAL EXAMINATION:  Constitutional: Appears well-developed and well-nourished. No distress. HENT: Normocephalic. Marland Kitchen Oropharynx is clear and moist.  Eyes: Conjunctivae and EOM are normal. PERRLA, no scleral icterus.  Neck: Normal ROM. Neck supple. No JVD. No tracheal deviation. CVSrrr S1/S2 +, no murmurs, no gallops, no carotid bruit.  Pulmonary: Effort and breath sounds normal, no stridor, rhonchi, wheezes, rales.  Abdominal: Soft. BS +,  no distension, tenderness, rebound or guarding.  Musculoskeletal: Right AKA left BKA neuro: Alert. CN 2-12 grossly intact. No focal deficits. Skin: Skin is warm and dry. No rash noted. Psychiatric: Normal mood and  affect.      LABORATORY PANEL:   CBC Recent Labs  Lab 09/26/18 0327  WBC 15.2*  HGB 7.3*  HCT 25.1*  PLT 440*   ------------------------------------------------------------------------------------------------------------------  Chemistries  Recent Labs  Lab 09/26/18 0327  NA 133*  K 5.7*  CL 94*  CO2 27  GLUCOSE 235*  BUN 78*  CREATININE 7.02*  CALCIUM 9.7   ------------------------------------------------------------------------------------------------------------------  Cardiac Enzymes Recent Labs  Lab 09/23/18 0836 09/23/18 1449 09/23/18 1952  TROPONINI 0.26* 0.20* 0.18*   ------------------------------------------------------------------------------------------------------------------  RADIOLOGY:  No results found.   ASSESSMENT AND PLAN:   57 year old male with end-stage renal disease on hemodialysis, peripheral vascular disease with recent AKA, diabetes  who presented to the ER due to fever.  1.  Sepsis: Patient presented with fever and leukocytosis. Sepsis from unclear etiology with negative chest x-ray. Right ear with otitis media Cultures have been negative. ID and vascular consultation appreciated Continue Cipro OTIC eardrops for otitis media and vancomycin Discussed with Dr. Ola Spurr today Plan for possible CT of the chest and abdomen due to fever   2.  Acute on chronic anemia: Patient is status post 2 unit PRBC Discussed with cardiology and we will discontinue Eliquis.   Continue ferrous sulfate Epogen if indicated during dialysis  3.  History of hypotension Continue midodrine before dialysis treatments  4.  Peripheral artery disease status post recent right above-knee amputation: Vascular surgery consultation appreciated.  Stump looks clean and intact and is not source of sepsis.   5.  Diabetes: Continue current care with ADA diet  6.  PAF: Continue amiodarone Currently in normal sinus rhythm.  7.  End-stage renal disease on  hemodialysis: Continue dialysis as per schedule by nephrology  8.  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     9.  Chest pain: This pain is due to atrial fibrillation and anemia.  Patient ruled out for ACS. Cardiology consultation appreciated.   10.  Tremor: EEG was negative for seizure activity.  Gabapentin has been discontinued by neurology.    11.  Otitis media: Continue Cipro drops  Management plans discussed with the patient and he is in agreement.  CODE STATUS: Full  TOTAL TIME TAKING CARE OF THIS PATIENT: 26 minutes.   Physical therapy is recommending skilled nursing facility Palliative care consultation for goals of care appreciated.   POSSIBLE D/C 2 days, DEPENDING ON CLINICAL CONDITION.   [name] M.D on 09/26/2018 at 10:06 AM  Between 7am to 6pm - Pager - [phone] After 6pm go to [email or web address] - password [email or web address] [facility]  Office  [phone]  CC: Primary care physician; [name], MD  Note: This dictation was prepared with Dragon dictation along with smaller phrase technology. Any transcriptional errors that result from this process are unintentional.

## 2018-09-26 NOTE — Care Management Important Message (Signed)
Important Message  Patient Details  Name: [name] MRN: [id number] Date of Birth: [birth year]   Medicare Important Message Given:  Yes    Juliann Pulse A Joven Mom 09/26/2018, 11:17 AM

## 2018-09-26 NOTE — Progress Notes (Signed)
Physical Therapy Treatment Patient Details Name: [name] MRN: [id number] DOB: 10/15/1961 Today's Date: 09/26/2018    History of Present Illness 57 year old male admited from rehab facility secondary to sepsis, he had R AKA 11/26. Patient has PMH: DM, PVD, L BKA, on HD. Patient has prothesis for L (not here in the hospital)    PT Comments    Pt in dialysis most of the AM and has a CT scan later today but was eager and willing to do light exercises with PT this date.  Interpreter Irwing Madrid utilized t/o session.  Spent much of the session educating and answering questions of pt and wife as well as clarifying aspects of care and expected course of PT/rehab.  He did well with exercises on R and showed very good strength/confidence with L LE.  He has been having less severe/frequent tremors in UEs.  Follow Up Recommendations  SNF     Equipment Recommendations  (TBD at rehab, eventually R prosthetic)    Recommendations for Other Services       Precautions / Restrictions Precautions Precautions: Fall Restrictions Other Position/Activity Restrictions: s/p R AKA, no WB issues with L BKA    Mobility  Bed Mobility               General bed mobility comments: Pt fatigued from dialysis, does not wish to get to EOB, had also recently transferred to new bed and was not wanting to do a lot more moving before having further imaging this afternoon  Transfers                    Ambulation/Gait                 Stairs             Wheelchair Mobility    Modified Rankin (Stroke Patients Only)       Balance Overall balance assessment: (remained semirecumbent in bed t/o session)                                          Cognition Arousal/Alertness: Awake/alert Behavior During Therapy: WFL for tasks assessed/performed Overall Cognitive Status: Within Functional Limits for tasks assessed                                         Exercises General Exercises - Lower Extremity Gluteal Sets: Right;Strengthening(resisted hip extension through ROM) Hip ABduction/ADduction: Strengthening;10 reps;Both Straight Leg Raises: AROM;10 reps;Both    General Comments        Pertinent Vitals/Pain Pain Score: (c/o some R stump pain that is minimal)    Home Living                      Prior Function            PT Goals (current goals can now be found in the care plan section) Progress towards PT goals: Progressing toward goals    Frequency    Min 2X/week      PT Plan Current plan remains appropriate    Co-evaluation              AM-PAC PT "6 Clicks" Mobility   Outcome Measure  Help needed turning from your back to your side  while in a flat bed without using bedrails?: None Help needed moving from lying on your back to sitting on the side of a flat bed without using bedrails?: A Lot Help needed moving to and from a bed to a chair (including a wheelchair)?: Total Help needed standing up from a chair using your arms (e.g., wheelchair or bedside chair)?: Total Help needed to walk in hospital room?: Total Help needed climbing 3-5 steps with a railing? : Total 6 Click Score: 10    End of Session   Activity Tolerance: Patient tolerated treatment well;Patient limited by fatigue Patient left: with bed alarm set;with call bell/phone within reach   PT Visit Diagnosis: Muscle weakness (generalized) (M62.81);Other abnormalities of gait and mobility (R26.89)     Time: 1600-1630 PT Time Calculation (min) (ACUTE ONLY): 30 min  Charges:  $Therapeutic Exercise: 8-22 mins $Therapeutic Activity: 8-22 mins                     Kreg Shropshire, DPT 09/26/2018, 5:17 PM

## 2018-09-26 NOTE — Progress Notes (Signed)
[facility]  ROUNDING NOTE   Subjective:   Seen and examined on hemodialysis treatment. Atrial fibrillation with RVR. Complains of back pain and wants the bed repositioned.     HEMODIALYSIS FLOWSHEET:  Blood Flow Rate (mL/min): 300 mL/min Arterial Pressure (mmHg): -100 mmHg Venous Pressure (mmHg): 130 mmHg Transmembrane Pressure (mmHg): 50 mmHg Ultrafiltration Rate (mL/min): 90 mL/min Dialysate Flow Rate (mL/min): 600 ml/min Conductivity: Machine : 13.5 Conductivity: Machine : 13.5 Dialysis Fluid Bolus: Normal Saline Bolus Amount (mL): 250 mL    Objective:  Vital signs in last 24 hours:  Temp:  [98.9 F (37.2 C)-100.1 F (37.8 C)] 98.9 F (37.2 C) (12/13 1108) Pulse Rate:  [41-154] 62 (12/13 1245) Resp:  [12-28] 20 (12/13 1245) BP: (81-145)/(48-119) 145/119 (12/13 1245) SpO2:  [91 %-98 %] 94 % (12/13 1108)  Weight change:  Filed Weights   09/22/18 1355 09/24/18 0917 09/24/18 1314  Weight: 113.4 kg 115.4 kg 115.1 kg    Intake/Output: No intake/output data recorded.   Intake/Output this shift:  Total I/O In: 240 [P.O.:240] Out: -   Physical Exam: General: NAD,   Head: Normocephalic, atraumatic. Moist oral mucosal membranes  Eyes: Anicteric, PERRL  Neck: Supple, trachea midline  Lungs:  Clear to auscultation  Heart: irregular  Abdomen:  Soft, nontender, obese  Extremities:  bilateral amputations  Neurologic: Nonfocal, moving all four extremities  Skin: Stage 1 decubitus ulcer  Access: AVF    Basic Metabolic Panel: Recent Labs  Lab 09/22/18 0838 09/23/18 0318 09/24/18 0512 09/25/18 0523 09/26/18 0327  NA 132* 135 134* 135 133*  K 5.9* 4.7 5.1 4.9 5.7*  CL 95* 95* 94* 95* 94*  CO2 25 29 27 28 27   GLUCOSE 158* 201* 148* 208* 235*  BUN 78* 50* 73* 57* 78*  CREATININE 8.93* 6.05* 7.96* 5.69* 7.02*  CALCIUM 8.2* 8.5* 9.2 9.1 9.7  PHOS 4.2  --   --   --   --     Liver Function Tests: Recent Labs  Lab 09/22/18 0838  ALBUMIN 2.8*    No results for input(s): LIPASE, AMYLASE in the last 168 hours. No results for input(s): AMMONIA in the last 168 hours.  CBC: Recent Labs  Lab 09/23/18 0318 09/24/18 0512 09/24/18 1020 09/24/18 1518 09/25/18 0523 09/26/18 0327  WBC 17.3* 15.4* 15.6*  --  13.5* 15.2*  HGB 7.3* 6.7* 7.0* 7.9* 7.2* 7.3*  HCT 24.8* 22.8* 23.8* 26.0* 24.4* 25.1*  MCV 99.2 99.1 100.0  --  97.6 99.6  PLT 421* 405* 429*  --  430* 440*    Cardiac Enzymes: Recent Labs  Lab 09/22/18 2215 09/23/18 0318 09/23/18 0836 09/23/18 1449 09/23/18 1952  TROPONINI 0.18* 0.25* 0.26* 0.20* 0.18*    BNP: Invalid input(s): POCBNP  CBG: Recent Labs  Lab 09/25/18 0721 09/25/18 1219 09/25/18 1720 09/25/18 2133 09/26/18 0737  GLUCAP 172* 197* 177* 201* 208*    Microbiology: Results for orders placed or performed during the hospital encounter of 09/21/18  Blood Culture (routine x 2)     Status: None   Collection Time: 09/21/18  3:00 PM  Result Value Ref Range Status   Specimen Description BLOOD R AC  Final   Special Requests   Final    BOTTLES DRAWN AEROBIC AND ANAEROBIC Blood Culture results may not be optimal due to an excessive volume of blood received in culture bottles   Culture   Final    NO GROWTH 5 DAYS Performed at [facility], [place] [address]    Report Status 09/26/2018 FINAL  Final  Blood Culture (routine x 2)     Status: None   Collection Time: 09/21/18  3:00 PM  Result Value Ref Range Status   Specimen Description BLOOD R HAND  Final   Special Requests   Final    BOTTLES DRAWN AEROBIC AND ANAEROBIC Blood Culture adequate volume   Culture   Final    NO GROWTH 5 DAYS Performed at [facility], [address] [place], [place] [address]    Report Status 09/26/2018 FINAL  Final  MRSA PCR Screening     Status: None   Collection Time: 09/21/18 11:36 PM  Result Value Ref Range Status   MRSA by PCR NEGATIVE NEGATIVE Final    Comment:         The GeneXpert MRSA Assay (FDA approved for NASAL specimens only), is one component of a comprehensive MRSA colonization surveillance program. It is not intended to diagnose MRSA infection nor to guide or monitor treatment for MRSA infections. Performed at [facility], [address] [place], [place] [address]     Coagulation Studies: No results for input(s): LABPROT, INR in the last 72 hours.  Urinalysis: No results for input(s): COLORURINE, LABSPEC, PHURINE, GLUCOSEU, HGBUR, BILIRUBINUR, KETONESUR, PROTEINUR, UROBILINOGEN, NITRITE, LEUKOCYTESUR in the last 72 hours.  Invalid input(s): APPERANCEUR    Imaging: No results found.   Medications:   . sodium chloride 250 mL (09/24/18 1721)  . vancomycin Stopped (09/24/18 1259)   . sodium chloride   Intravenous Once  . sodium chloride   Intravenous Once  . amiodarone  200 mg Oral Daily  . aspirin EC  81 mg Oral Daily  . Chlorhexidine Gluconate Cloth  6 each Topical Q0600  . cinacalcet  60 mg Oral Once per day on Mon Wed Fri  . ciprofloxacin-dexamethasone  4 drop Right EAR BID  . epoetin (EPOGEN/PROCRIT) injection  10,000 Units Intravenous Q M,W,F-HD  . feeding supplement (NEPRO CARB STEADY)  237 mL Oral BID BM  . feeding supplement (PRO-STAT SUGAR FREE 64)  30 mL Oral Daily  . ferrous sulfate  325 mg Oral TID WC  . Gerhardt's butt cream   Topical QID  . glipiZIDE  10 mg Oral Q breakfast  . insulin aspart  0-15 Units Subcutaneous TID WC  . insulin aspart  0-5 Units Subcutaneous QHS  . iopamidol  15 mL Oral Q1 Hr x 2  . metoprolol tartrate  12.5 mg Oral BID  . midodrine  5 mg Oral TID WC  . multivitamin  1 tablet Oral QHS  . pantoprazole  40 mg Oral Daily  . polyethylene glycol  17 g Oral BID  . senna-docusate  1 tablet Oral BID  . sevelamer carbonate  2,400 mg Oral TID WC  . sodium chloride flush  3 mL Intravenous Q12H  . sodium polystyrene  30 g Oral Once   sodium chloride, acetaminophen,  hydrALAZINE, lidocaine-prilocaine, metoCLOPramide (REGLAN) injection, metoprolol tartrate, midodrine, ondansetron **OR** ondansetron (ZOFRAN) IV, oxyCODONE, sodium chloride flush  Assessment/ Plan:  [name] is a 57 y.o. Hispanic (Spanish speaking only) malewith end stage renal disease, peripheral vascular disease, hypertension, CVA, hyperlipidemia, diabetes mellitus type II who is admitted to [facility] EDW 106.5 kg Left AVF  1. End Stage Renal Disease:Seen and examined on hemodialysis treatment. With a-fib on treatment.      2. Anemia of chronic kidney disease: status  post PRBC transfusions. Hemoglobin 7.2 - EPO with HD treatment  3. Hypotension - Midodrine   - Appreciate cards input.   4. Secondary Hyperparathyroidism:  - continue Renvela and Sensipar with meals.  - Cinacalcet three times weekly.   5. Ear pain: empiric ciprofloxacin/dexamethasone otic solution.   6. Chest pain with atrial fibrillation: with elevated cardiac enzymes thought to be demand ischemia - Appreciate cardiology  7. Sepsis:  Appreciate ID input. Outpatient cultures no growth.  - Vancomycin  8. Jerking - off gabapentin - Appreciate neurology   LOS: 5 Kolette Vey 12/13/20191:01 PM

## 2018-09-26 NOTE — Progress Notes (Signed)
This note also relates to the following rows which could not be included: Pulse Rate - Cannot attach notes to unvalidated device data Resp - Cannot attach notes to unvalidated device data  Hd started  

## 2018-09-26 NOTE — Progress Notes (Signed)
Progress Note  Patient Name: [name] Date of Encounter: 09/26/2018  Primary Cardiologist: Kathlyn Sacramento, MD   Subjective   Patient back in atrial fibrillation and cardiology asked to see patient today for management.  Patient examined after HD treatment today 12/13. Telemetry after HD showed Afib with ventricular rates into the 130s.  Patient denied current cardiac complaints but reported pain in his chest and significant SOB yesterday. No current chest pain reported.  He also denied feelings of rapid heart rate or palpitations.  He denied current feelings of pre-syncope or dizziness. Per patient's wife, patient always feels dizzy with dialysis. She stated his blood pressure drops at that time and he becomes tachycardic, which on review of chart, is consistent with his recorded vitals.     When asked how the patient was feeling at that present moment, the patient reported that he felt very good. The wife stated she remains concerned about his HR and BP. She is also concerned about his tremors and inability to hold things in his hands; neurology is following the patient.   Of note, the patient is being moved to a different bed d/t reported back pain.  Inpatient Medications    Scheduled Meds: . sodium chloride   Intravenous Once  . sodium chloride   Intravenous Once  . amiodarone  200 mg Oral Daily  . aspirin EC  81 mg Oral Daily  . Chlorhexidine Gluconate Cloth  6 each Topical Q0600  . cinacalcet  60 mg Oral Once per day on Mon Wed Fri  . ciprofloxacin-dexamethasone  4 drop Right EAR BID  . epoetin (EPOGEN/PROCRIT) injection  10,000 Units Intravenous Q M,W,F-HD  . feeding supplement (NEPRO CARB STEADY)  237 mL Oral BID BM  . feeding supplement (PRO-STAT SUGAR FREE 64)  30 mL Oral Daily  . ferrous sulfate  325 mg Oral TID WC  . Gerhardt's butt cream   Topical QID  . glipiZIDE  10 mg Oral Q breakfast  . insulin aspart  0-15 Units Subcutaneous TID WC  . insulin aspart  0-5  Units Subcutaneous QHS  . metoprolol tartrate  12.5 mg Oral BID  . midodrine  5 mg Oral TID WC  . multivitamin  1 tablet Oral QHS  . pantoprazole  40 mg Oral Daily  . polyethylene glycol  17 g Oral BID  . senna-docusate  1 tablet Oral BID  . sevelamer carbonate  2,400 mg Oral TID WC  . sodium chloride flush  3 mL Intravenous Q12H  . sodium polystyrene  30 g Oral Once   Continuous Infusions: . sodium chloride 250 mL (09/24/18 1721)  . vancomycin Stopped (09/24/18 1259)   PRN Meds: sodium chloride, acetaminophen, hydrALAZINE, lidocaine-prilocaine, metoCLOPramide (REGLAN) injection, metoprolol tartrate, midodrine, ondansetron **OR** ondansetron (ZOFRAN) IV, oxyCODONE, sodium chloride flush   Vital Signs    Vitals:   09/26/18 1245 09/26/18 1300 09/26/18 1309 09/26/18 1330  BP: (!) 145/119 (!) 157/142 (!) 95/53 (!) 115/57  Pulse: 62 (!) 42 67 (!) 57  Resp: 20 14 14 12   Temp:      TempSrc:      SpO2:      Weight:      Height:        Intake/Output Summary (Last 24 hours) at 09/26/2018 1341 Last data filed at 09/26/2018 1039 Gross per 24 hour  Intake 240 ml  Output -  Net 240 ml   Filed Weights   09/22/18 1355 09/24/18 0917 09/24/18 1314  Weight: 113.4  kg 115.4 kg 115.1 kg    Telemetry    Afib with RVR and ventricular rates into the 130s  ECG    EKG obtained on 09/26/2018 showed patient back in atrial fibrillation at ventricular rate 123bpm, ectopy, low voltage QRS, IVCD, and nonspecific ST and T wave abnormality.  - Personally Reviewed  Physical Exam   GEN: Ill appearing man.   Neck: JVD not assessed Cardiac: IRIR, tachycardic rate. no murmurs, rubs, or gallops.  Respiratory: Clear to auscultation bilaterally. GI: Soft, nontender, non-distended  MS: s/p R AKA, L BKA, No significant edema in either thigh; No deformity. Neuro:  Nonfocal  Psych: Normal affect   Labs    Chemistry Recent Labs  Lab 09/22/18 0838  09/24/18 0512 09/25/18 0523 09/26/18 0327    NA 132*   < > 134* 135 133*  K 5.9*   < > 5.1 4.9 5.7*  CL 95*   < > 94* 95* 94*  CO2 25   < > 27 28 27   GLUCOSE 158*   < > 148* 208* 235*  BUN 78*   < > 73* 57* 78*  CREATININE 8.93*   < > 7.96* 5.69* 7.02*  CALCIUM 8.2*   < > 9.2 9.1 9.7  ALBUMIN 2.8*  --   --   --   --   GFRNONAA 6*   < > 7* 10* 8*  GFRAA 7*   < > 8* 12* 9*  ANIONGAP 12   < > 13 12 12    < > = values in this interval not displayed.     Hematology Recent Labs  Lab 09/24/18 1020 09/24/18 1518 09/25/18 0523 09/26/18 0327  WBC 15.6*  --  13.5* 15.2*  RBC 2.38*  --  2.50* 2.52*  HGB 7.0* 7.9* 7.2* 7.3*  HCT 23.8* 26.0* 24.4* 25.1*  MCV 100.0  --  97.6 99.6  MCH 29.4  --  28.8 29.0  MCHC 29.4*  --  29.5* 29.1*  RDW 16.1*  --  17.2* 16.6*  PLT 429*  --  430* 440*    Cardiac Enzymes Recent Labs  Lab 09/23/18 0318 09/23/18 0836 09/23/18 1449 09/23/18 1952  TROPONINI 0.25* 0.26* 0.20* 0.18*   No results for input(s): TROPIPOC in the last 168 hours.   BNPNo results for input(s): BNP, PROBNP in the last 168 hours.   DDimer No results for input(s): DDIMER in the last 168 hours.   Radiology    No results found.  Cardiac Studies   09/10/2018 TTE  Study Conclusions - Procedure narrative: Transthoracic echocardiography for left ventricular function evaluation, for right ventricular function evaluation, and for assessment of valvular function. Image quality was suboptimal. The study was technically difficult. - Left ventricle: The cavity size was normal. Wall thickness was increased in a pattern of severe LVH. Systolic function was vigorous. The estimated ejection fraction was in the range of 65% to 70%, though atrial fibrillation with rapid ventricular response limits evaluation. - Mitral valve: Calcified annulus. - Left atrium: The atrium was mildly dilated. - Right ventricle: The cavity size was normal. Systolic function was normal.   LHC 04/30/2016  Ost LM to LM lesion,  10% stenosed.  Ost RCA to Prox RCA lesion, 100% stenosed.  There is moderate left ventricular systolic dysfunction.  1. Severe one-vessel coronary artery disease with chronically occluded proximal right coronary artery with left to right collaterals. Minimal irregularities affecting the left coronary arteries. 2. Moderately reduced LV systolic function with an ejection fraction of  30-35% with global hypokinesis more pronounced in the basal inferior wall. 3. Moderately elevated left ventricular end-diastolic pressure. Recommendations: Occluded right coronary artery appears to be chronic with left to right collaterals. Recommend medical therapy for coronary artery disease and systolic heart failure. Blood pressure during dialysis is going to be a limiting factor. Continue metoprolol. The patient does have frequent PVCs. Recommend an outpatient Holter monitor to quantify the PVCs which might be contributing to his degree of cardiomyopathy. EP evaluation at some point to see if he is a candidate for ICD placement.    Patient Profile     57 y.o. male with a hx of 1vCAD(2017 LHC), HFrEF(EF 30-35%, 2017 LHC), h/o polymorphic VT&cardiac arrest 2017,CVA,ESRD on HD since 2008, HTN with labile pressures, DM2, HLD, PAD withLBKA/ R AKA, stroke, anemia of CKDwho is being seen today for the evaluation of new onset paroxysmal Afib with RVR.  Assessment & Plan    Paroxysmal Afib with RVR -H/o past 2017 polymorphic VT as above in HPI; newly dx paroxysmal Afib with RVR as of November 2019 admission. CHA2DS2VASc score of at least6 (HFrEF, HTN, DM2, CVAx2, PAD); 9.7% stroke risk/year.  - In and out of Afib this admission. Originally consulted for Afib at admission with patient back in SR by the time of assessment. Asked to see patient today as back in Afib with RVR. On review of his vitals, it appears as if he may have gone back into Afib on 12/11. EKG obtained on 09/26/2018 showed patient back in  atrial fibrillation at ventricular rate 123bpm, ectopy, low voltage QRS, IVCD, and nonspecific ST and T wave abnormality. Telemetry shows ventricular rates into the 130s. -Management of ventricular rate in Afib complicated by labile BP with patient ESRD on HD with SBP down to 80s-90s at times. Continue medical management with amiodarone 200mg  BID. BB was restarted since initial cardiology consultation given improvement in SBP; however, with HD, SBP drops into 80s-90s. Consider stopping BB before dialysis (as hypotension likely exacerbates elevated HR / could be a trigger for Afib) and restarting after treatment. Given risk of stroke, anticoagulation previously recommended but discontinued earlier in admission given severe anemia. Recommend continue to hold anticoagulation with Hgb 7.3. - Will need outpatient follow-up  Sepsis --Unknown etiology of infection --Per IM, ID  CADwith known severe 1v disease s/p LHC 2017 -No current CP. Known occluded RCA with L-R collaterals. Troponin elevated at admission and likely d/t demand ischemia in the setting of rapid ventricular rates with sepsis and multiorgan dysfunction on HD and complicated by severe anemia. No indication / no recommendation for urgent ischemic evaluation with cardiac catheterization. At this time, he is not an ideal candidate for invasive procedures, including cardiac catheterization, d/t his active infection and severe anemia.  - HD with labile BP and anemia are limiting factors for medical management. Consider discontinuing ASA d/t Hgb 7.3. As above, consider holding BB before HD treatment d/t hypotension during tx. SL nitro if CP returns. Will continue to monitor and plan for outpatient follow-up after discharge.  HFrEFwith 2017 LHC showing EF 30-35% - 2017 TTE and catheterization showed EF reduced with LHC EF 30-35%.08/2018 echo showed normal LV systolic function. --Does not appear volume overloaded on exam  --Would not recommend  lasix and hydralazine at this time given hypotension and sepsis -  BP during HD and sepsis a limiting factor as noted above.   ARF on HD withanemia of chronic disease - K 5.7 (on HD), Cr 7.02 but improved from initial  Cr of 8.93 at admission yet increased since yesterday. - Daily CBC and BMET to monitor Cr, electrolytes, and Hgb with known anemia of chronic disease and on heparin. Hgb 7.3. Continue Fe supplementation.  -Further management per nephrology  HTN - Hypotensive, septic - On midodrine. Continue as needed. Recommend holding BB before HD.  - Monitor vitals  DM2 - SSI - Per IM  PVD - Bilateral amputations as above - Followed by vascular surgery   For questions or updates, please contact [facility] Please consult [email or web address] for contact info under        Signed, Arvil Chaco, PA-C  09/26/2018, 1:41 PM

## 2018-09-26 NOTE — Progress Notes (Signed)
Post dialysis assessment 

## 2018-09-26 NOTE — Consult Note (Signed)
Pharmacy Antibiotic Note  [name] is a 57 y.o. male admitted on 09/21/2018 with acute sepsis secondary to unknown etiology.   Pharmacy has been consulted for Vancomycin/Cefepime dosing.  Plan:  Patient has ESRD and is on hemodialysis.  Patient received Cefepime 2g IV in ED 12/8 @ 1714 Patient received Vancomycin 1g IV in ED 12/8 @ 1803  Patient will receive an additional dose of Vancomycin to finish his loading dose of 1000mg  for a total loading dose of 2000mg .    Patient will receive 1g Vancomycin IV after each dialyses session and  1g q 24h Cefepime IV (after dialyses session on dialysis days)  Pt will have a random vanc drawn prior to 3rd HD to assess therapeutic level.  Target pre-HD level is 15-25 mcg/ml    Height: 5\' 10"  (177.8 cm) Weight: 253 lb 12 oz (115.1 kg) IBW/kg (Calculated) : 73  Temp (24hrs), Avg:98.8 F (37.1 C), Min:97.7 F (36.5 C), Max:100.1 F (37.8 C)  Recent Labs  Lab 09/21/18 1546 09/21/18 1903 09/21/18 2047 09/22/18 0027 09/22/18 0418 09/22/18 0838 09/23/18 0318 09/24/18 0512 09/24/18 1020 09/25/18 0523 09/26/18 0327  WBC  --   --   --   --   --  19.4* 17.3* 15.4* 15.6* 13.5* 15.2*  CREATININE  --   --   --   --   --  8.93* 6.05* 7.96*  --  5.69* 7.02*  LATICACIDVEN 2.30* 0.65 2.3* 1.9  --   --   --   --   --   --   --   VANCOTROUGH  --   --   --   --   --   --   --   --   --   --  18  VANCORANDOM  --   --   --   --  21  --   --   --   --   --   --     Estimated Creatinine Clearance: 14.4 mL/min (A) (by C-G formula based on SCr of 7.02 mg/dL (H)).    No Known Allergies  Antimicrobials this admission: Vancomycin 12/8 >>    Cefepime 12/8 >>   Dose adjustments this admission: 12/13 AM vanc level 18. Continue current regimen.  Microbiology results: 12/8 BCx: pending   Thank you for allowing pharmacy to be a part of this patient's care.  Paulina Fusi, PharmD, BCPS 09/26/2018 5:50 AM

## 2018-09-26 NOTE — Progress Notes (Addendum)
Subjective: Patient asleep but was able to wake up and participate in exam. Denies concerns. Minimal tremors or myoclonic jerking movements noted during exam today. Neurontin was discontinued yesterday.   Objective: Current vital signs: BP 93/73 (BP Location: Right Arm)   Pulse (!) 111   Temp 99 F (37.2 C) (Oral)   Resp (!) 22   Ht 5\' 10"  (1.778 m)   Wt 115.1 kg   SpO2 98%   BMI 36.41 kg/m  Vital signs in last 24 hours: Temp:  [98.4 F (36.9 C)-100.1 F (37.8 C)] 99 F (37.2 C) (12/13 0454) Pulse Rate:  [68-111] 111 (12/13 0454) Resp:  [22-28] 22 (12/13 0454) BP: (93-131)/(56-97) 93/73 (12/13 0454) SpO2:  [91 %-98 %] 98 % (12/13 0454)  Intake/Output from previous day: No intake/output data recorded. Intake/Output this shift: No intake/output data recorded. Nutritional status:  Diet Order            Diet renal/carb modified with fluid restriction Diet-HS Snack? Nothing; Fluid restriction: 1200 mL Fluid; Room service appropriate? Yes; Fluid consistency: Thin  Diet effective now             Neurological Exam  Mental Status: Alert, oriented, thought content appropriate. Speech fluent without evidence of aphasia. Able to follow 3 step commands without difficulty. Attention span and concentration seemed appropriate  Cranial Nerves: II: Discs flat bilaterally; Visual fields grossly normal, pupils equal, round, reactive to light and accommodation III,IV, VI: ptosis not present, extra-ocular motions intact bilaterally V,VII: mild right facial droop, facial light touch sensationintact VIII: hearing normal bilaterally IX,X: gag reflex present XI: bilateral shoulder shrug XII: midline tongue extension Motor: Right :Upper extremity 4/5without pronator driftLeft: Upper extremity 5/5 without pronator drift Right:BKALeft:BKA Tone and bulk:normal tone throughout; no atrophy noted Minimal myoclonic jerking  noted Sensory: Pinprick and light touchintact bilaterally Deep Tendon Reflexes: 2+ and symmetric in the upper extremities Plantars: Bilateral AKA   Data Reviewed  Lab Results: Basic Metabolic Panel: Recent Labs  Lab 09/22/18 0838 09/23/18 0318 09/24/18 0512 09/25/18 0523 09/26/18 0327  NA 132* 135 134* 135 133*  K 5.9* 4.7 5.1 4.9 5.7*  CL 95* 95* 94* 95* 94*  CO2 25 29 27 28 27   GLUCOSE 158* 201* 148* 208* 235*  BUN 78* 50* 73* 57* 78*  CREATININE 8.93* 6.05* 7.96* 5.69* 7.02*  CALCIUM 8.2* 8.5* 9.2 9.1 9.7  PHOS 4.2  --   --   --   --     Liver Function Tests: Recent Labs  Lab 09/22/18 0838  ALBUMIN 2.8*   No results for input(s): LIPASE, AMYLASE in the last 168 hours. No results for input(s): AMMONIA in the last 168 hours.  CBC: Recent Labs  Lab 09/23/18 0318 09/24/18 0512 09/24/18 1020 09/24/18 1518 09/25/18 0523 09/26/18 0327  WBC 17.3* 15.4* 15.6*  --  13.5* 15.2*  HGB 7.3* 6.7* 7.0* 7.9* 7.2* 7.3*  HCT 24.8* 22.8* 23.8* 26.0* 24.4* 25.1*  MCV 99.2 99.1 100.0  --  97.6 99.6  PLT 421* 405* 429*  --  430* 440*    Cardiac Enzymes: Recent Labs  Lab 09/22/18 2215 09/23/18 0318 09/23/18 0836 09/23/18 1449 09/23/18 1952  TROPONINI 0.18* 0.25* 0.26* 0.20* 0.18*    Lipid Panel: No results for input(s): CHOL, TRIG, HDL, CHOLHDL, VLDL, LDLCALC in the last 168 hours.  CBG: Recent Labs  Lab 09/25/18 0721 09/25/18 1219 09/25/18 1720 09/25/18 2133 09/26/18 0737  GLUCAP 172* 197* 177* 201* 208*    Microbiology: Results for orders  placed or performed during the hospital encounter of 09/21/18  Blood Culture (routine x 2)     Status: None   Collection Time: 09/21/18  3:00 PM  Result Value Ref Range Status   Specimen Description BLOOD R AC  Final   Special Requests   Final    BOTTLES DRAWN AEROBIC AND ANAEROBIC Blood Culture results may not be optimal due to an excessive volume of blood received in culture bottles   Culture   Final    NO  GROWTH 5 DAYS Performed at [facility], [address] [place], [place] [address]    Report Status 09/26/2018 FINAL  Final  Blood Culture (routine x 2)     Status: None   Collection Time: 09/21/18  3:00 PM  Result Value Ref Range Status   Specimen Description BLOOD R HAND  Final   Special Requests   Final    BOTTLES DRAWN AEROBIC AND ANAEROBIC Blood Culture adequate volume   Culture   Final    NO GROWTH 5 DAYS Performed at [facility], [address] [place], [place] [address]    Report Status 09/26/2018 FINAL  Final  MRSA PCR Screening     Status: None   Collection Time: 09/21/18 11:36 PM  Result Value Ref Range Status   MRSA by PCR NEGATIVE NEGATIVE Final    Comment:        The GeneXpert MRSA Assay (FDA approved for NASAL specimens only), is one component of a comprehensive MRSA colonization surveillance program. It is not intended to diagnose MRSA infection nor to guide or monitor treatment for MRSA infections. Performed at [facility], [address] [place], [place] [address]     Coagulation Studies: No results for input(s): LABPROT, INR in the last 72 hours.  Imaging: No results found.  Medications:  I have reviewed the patient's current medications. Prior to Admission:  Medications Prior to Admission  Medication Sig Dispense Refill Last Dose  . acetaminophen (TYLENOL) 325 MG tablet Take 650 mg by mouth every 6 (six) hours as needed.   prn at prn  . Amino Acids-Protein Hydrolys (FEEDING SUPPLEMENT, PRO-STAT SUGAR FREE 64,) LIQD Take 30 mLs by mouth daily.   09/20/2018 at 2000  . amiodarone (PACERONE) 200 MG tablet Take 1 tablet (200 mg total) by mouth daily.   09/20/2018 at 2100  . apixaban (ELIQUIS) 5 MG TABS tablet Take 1 tablet (5 mg total) by mouth 2 (two) times daily. 60 tablet 0 09/21/2018 at 1400  . aspirin EC 81 MG tablet Take 81 mg by mouth daily.   09/21/2018 at 1400  . b complex-vitamin c-folic acid (NEPHRO-VITE) 0.8 MG  TABS tablet Take 1 tablet by mouth daily.   09/21/2018 at 1400  . cinacalcet (SENSIPAR) 60 MG tablet Take 60 mg by mouth daily.    09/21/2018 at 1400  . ferrous sulfate 325 (65 FE) MG EC tablet Take 325 mg by mouth 3 (three) times daily with meals.   09/21/2018 at 1300  . gabapentin (NEURONTIN) 300 MG capsule Take 1 capsule (300 mg total) by mouth 3 (three) times daily. 90 capsule 3 09/21/2018 at 1300  . glipiZIDE (GLUCOTROL) 10 MG tablet Take 10 mg by mouth daily.    09/20/2018 at 1800  . lidocaine-prilocaine (EMLA) cream Apply 1 application topically as needed (port access).   Past Week at Unknown time  . metoprolol tartrate (LOPRESSOR) 25 MG tablet Take 1 tablet (25 mg total) by mouth 2 (two) times  daily. 30 tablet 0 09/21/2018 at 1400  . midodrine (PROAMATINE) 5 MG tablet Take 1 tablet (5 mg total) by mouth 3 (three) times daily with meals.   09/21/2018 at 1400  . omeprazole (PRILOSEC) 40 MG capsule Take 40 mg by mouth 2 (two) times daily.    09/21/2018 at 0430  . oxyCODONE (OXY IR/ROXICODONE) 5 MG immediate release tablet Take 1 tablet (5 mg total) by mouth every 6 (six) hours as needed for moderate pain or severe pain. 10 tablet 0 prn at prn  . polyethylene glycol (MIRALAX / GLYCOLAX) packet Take 17 g by mouth 2 (two) times daily.    09/21/2018 at 1400  . senna-docusate (SENOKOT-S) 8.6-50 MG tablet Take 1 tablet by mouth 2 (two) times daily.   09/21/2018 at 0900  . sevelamer carbonate (RENVELA) 800 MG tablet Take 2,400 mg by mouth 3 (three) times daily with meals.    09/21/2018 at 1330   Scheduled: . sodium chloride   Intravenous Once  . sodium chloride   Intravenous Once  . amiodarone  200 mg Oral Daily  . aspirin EC  81 mg Oral Daily  . Chlorhexidine Gluconate Cloth  6 each Topical Q0600  . cinacalcet  60 mg Oral Once per day on Mon Wed Fri  . ciprofloxacin-dexamethasone  4 drop Right EAR BID  . epoetin (EPOGEN/PROCRIT) injection  10,000 Units Intravenous Q M,W,F-HD  . feeding supplement (NEPRO  CARB STEADY)  237 mL Oral BID BM  . feeding supplement (PRO-STAT SUGAR FREE 64)  30 mL Oral Daily  . ferrous sulfate  325 mg Oral TID WC  . Gerhardt's butt cream   Topical QID  . glipiZIDE  10 mg Oral Q breakfast  . insulin aspart  0-15 Units Subcutaneous TID WC  . insulin aspart  0-5 Units Subcutaneous QHS  . iopamidol  15 mL Oral Q1 Hr x 2  . metoprolol tartrate  12.5 mg Oral BID  . midodrine  5 mg Oral TID WC  . multivitamin  1 tablet Oral QHS  . pantoprazole  40 mg Oral Daily  . polyethylene glycol  17 g Oral BID  . senna-docusate  1 tablet Oral BID  . sevelamer carbonate  2,400 mg Oral TID WC  . sodium chloride flush  3 mL Intravenous Q12H  . sodium polystyrene  30 g Oral Once   Patient seen and examined.  Clinical course and management discussed.  Necessary edits performed.  I agree with the above.  Assessment and plan of care developed and discussed below.    Assessment/Plan: 57 y.o male with past medical history of atrial fibrillation with RVR, end-stage renal disease on hemodialysis MWF, anemia of chronic disease, hyperlipidemia, hypertension, peripheral arterial occlusive disease status post bilateral BKA, Diabetes mellitus, CAD, and hyperparathyroidism presenting with chief complaints of fevers, chest pain and episodes of involuntary muscle jerking and twitching.  Multiple metabolic derangements and infection noted at admission. Minimal myoclonus noted today during examination. Underlying cause remains multifactorial and related to all of these issues. Neurontin previously discontinued.  EEG was abnormal secondary to posterior background slowing with nonspecific etiology, otherwise no epileptiform activity was noted.  Recommendations: 1.  Agree with continued addressing of medical issues and discontinuation of Neurontin  This patient was staffed with Dr. Magda Paganini, Doy Mince who personally evaluated patient, reviewed documentation and agreed with assessment and plan of care as  above.  Rufina Falco, DNP, FNP-BC Board certified Nurse Practitioner Neurology Department    LOS: 5 days  09/26/2018  9:43 AM  Alexis Goodell, MD Neurology [phone]  09/26/2018  12:18 PM

## 2018-09-27 ENCOUNTER — Inpatient Hospital Stay (HOSPITAL_COMMUNITY)
Admit: 2018-09-27 | Discharge: 2018-09-27 | Disposition: A | Discharge status: Home / Self Care | Payer: Medicare Other | Attending: Interventional Cardiology | Admitting: Interventional Cardiology

## 2018-09-27 DIAGNOSIS — I313 Pericardial effusion (noninflammatory): Secondary | ICD-10-CM

## 2018-09-27 DIAGNOSIS — I361 Nonrheumatic tricuspid (valve) insufficiency: Secondary | ICD-10-CM

## 2018-09-27 DIAGNOSIS — I739 Peripheral vascular disease, unspecified: Secondary | ICD-10-CM

## 2018-09-27 DIAGNOSIS — I340 Nonrheumatic mitral (valve) insufficiency: Secondary | ICD-10-CM

## 2018-09-27 LAB — GLUCOSE, CAPILLARY
Glucose-Capillary: 198 mg/dL — ABNORMAL HIGH (ref 70–99)
Glucose-Capillary: 236 mg/dL — ABNORMAL HIGH (ref 70–99)
Glucose-Capillary: 143 mg/dL — ABNORMAL HIGH (ref 70–99)
Glucose-Capillary: 195 mg/dL — ABNORMAL HIGH (ref 70–99)

## 2018-09-27 LAB — CBC
HCT: 24.3 % — ABNORMAL LOW (ref 39.0–52.0)
Hemoglobin: 7.1 g/dL — ABNORMAL LOW (ref 13.0–17.0)
MCH: 29 pg (ref 26.0–34.0)
MCHC: 29.2 g/dL — ABNORMAL LOW (ref 30.0–36.0)
MCV: 99.2 fL (ref 80.0–100.0)
Platelets: 403 10*3/uL — ABNORMAL HIGH (ref 150–400)
RBC: 2.45 MIL/uL — ABNORMAL LOW (ref 4.22–5.81)
RDW: 16.5 % — ABNORMAL HIGH (ref 11.5–15.5)
WBC: 13.4 10*3/uL — ABNORMAL HIGH (ref 4.0–10.5)
nRBC: 0.4 % — ABNORMAL HIGH (ref 0.0–0.2)

## 2018-09-27 LAB — BASIC METABOLIC PANEL
Anion gap: 12 (ref 5–15)
BUN: 55 mg/dL — ABNORMAL HIGH (ref 6–20)
CO2: 28 mmol/L (ref 22–32)
Calcium: 9.5 mg/dL (ref 8.9–10.3)
Chloride: 91 mmol/L — ABNORMAL LOW (ref 98–111)
Creatinine, Ser: 5.56 mg/dL — ABNORMAL HIGH (ref 0.61–1.24)
GFR calc Af Amer: 12 mL/min — ABNORMAL LOW (ref >60)
GFR, EST NON AFRICAN AMERICAN: 10 mL/min — AB (ref >60)
GLUCOSE: 186 mg/dL — AB (ref 70–99)
Potassium: 5.2 mmol/L — ABNORMAL HIGH (ref 3.5–5.1)
Sodium: 131 mmol/L — ABNORMAL LOW (ref 135–145)

## 2018-09-27 LAB — ECHOCARDIOGRAM COMPLETE
Height: 70 in
Weight: 4112 oz

## 2018-09-27 LAB — PREPARE RBC (CROSSMATCH)

## 2018-09-27 MED ORDER — SODIUM CHLORIDE 0.9% IV SOLUTION: Freq: Once | INTRAVENOUS | Status: DC

## 2018-09-27 NOTE — Progress Notes (Signed)
[facility]  ROUNDING NOTE   Subjective:   Wife at bedside. Hemodialysis treatment yesterday. Did not tolerate treatment well. UF of  1 liter  Today with more shortness of breath, weakness and edema. Asking for an extra hemodialysis treatment   History taken with assistance of Spanish Interpreter  Objective:  Vital signs in last 24 hours:  Temp:  [97.8 F (36.6 C)-98.8 F (37.1 C)] 98.1 F (36.7 C) (12/14 0408) Pulse Rate:  [41-154] 95 (12/14 0958) Resp:  [11-22] 20 (12/14 0408) BP: (81-157)/(48-142) 91/48 (12/14 0958) SpO2:  [96 %-97 %] 96 % (12/14 0408) Weight:  [116.6 kg] 116.6 kg (12/14 0408)  Weight change:  Filed Weights   09/24/18 0917 09/24/18 1314 09/27/18 0408  Weight: 115.4 kg 115.1 kg 116.6 kg    Intake/Output: I/O last 3 completed shifts: In: 650.9 [P.O.:240; I.V.:20.1; IV Piggyback:390.7] Out: 1008 [Other:1008]   Intake/Output this shift:  No intake/output data recorded.  Physical Exam: General: NAD,   Head: Normocephalic, atraumatic. Moist oral mucosal membranes  Eyes: Anicteric, PERRL  Neck: Supple, trachea midline  Lungs:  Clear to auscultation  Heart: irregular  Abdomen:  Soft, nontender, obese  Extremities:  bilateral amputations, + 1 edema  Neurologic: Nonfocal, moving all four extremities  Skin: Stage 1 decubitus ulcer  Access: AVF    Basic Metabolic Panel: Recent Labs  Lab 09/22/18 0838 09/23/18 0318 09/24/18 0512 09/25/18 0523 09/26/18 0327 09/27/18 0552  NA 132* 135 134* 135 133* 131*  K 5.9* 4.7 5.1 4.9 5.7* 5.2*  CL 95* 95* 94* 95* 94* 91*  CO2 25 29 27 28 27 28   GLUCOSE 158* 201* 148* 208* 235* 186*  BUN 78* 50* 73* 57* 78* 55*  CREATININE 8.93* 6.05* 7.96* 5.69* 7.02* 5.56*  CALCIUM 8.2* 8.5* 9.2 9.1 9.7 9.5  PHOS 4.2  --   --   --   --   --     Liver Function Tests: Recent Labs  Lab 09/22/18 0838  ALBUMIN 2.8*   No results for input(s): LIPASE, AMYLASE in the last 168 hours. No results for  input(s): AMMONIA in the last 168 hours.  CBC: Recent Labs  Lab 09/24/18 0512 09/24/18 1020 09/24/18 1518 09/25/18 0523 09/26/18 0327 09/27/18 0552  WBC 15.4* 15.6*  --  13.5* 15.2* 13.4*  HGB 6.7* 7.0* 7.9* 7.2* 7.3* 7.1*  HCT 22.8* 23.8* 26.0* 24.4* 25.1* 24.3*  MCV 99.1 100.0  --  97.6 99.6 99.2  PLT 405* 429*  --  430* 440* 403*    Cardiac Enzymes: Recent Labs  Lab 09/22/18 2215 09/23/18 0318 09/23/18 0836 09/23/18 1449 09/23/18 1952  TROPONINI 0.18* 0.25* 0.26* 0.20* 0.18*    BNP: Invalid input(s): POCBNP  CBG: Recent Labs  Lab 09/25/18 2133 09/26/18 0737 09/26/18 1807 09/26/18 2108 09/27/18 0743  GLUCAP 201* 208* 190* 240* 198*    Microbiology: Results for orders placed or performed during the hospital encounter of 09/21/18  Blood Culture (routine x 2)     Status: None   Collection Time: 09/21/18  3:00 PM  Result Value Ref Range Status   Specimen Description BLOOD R AC  Final   Special Requests   Final    BOTTLES DRAWN AEROBIC AND ANAEROBIC Blood Culture results may not be optimal due to an excessive volume of blood received in culture bottles   Culture   Final    NO GROWTH 5 DAYS Performed at [facility], [address], [place], [place] [address]    Report  Status 09/26/2018 FINAL  Final  Blood Culture (routine x 2)     Status: None   Collection Time: 09/21/18  3:00 PM  Result Value Ref Range Status   Specimen Description BLOOD R HAND  Final   Special Requests   Final    BOTTLES DRAWN AEROBIC AND ANAEROBIC Blood Culture adequate volume   Culture   Final    NO GROWTH 5 DAYS Performed at [facility], [address] [place], [place] [address]    Report Status 09/26/2018 FINAL  Final  MRSA PCR Screening     Status: None   Collection Time: 09/21/18 11:36 PM  Result Value Ref Range Status   MRSA by PCR NEGATIVE NEGATIVE Final    Comment:        The GeneXpert MRSA Assay (FDA approved for NASAL specimens only), is one  component of a comprehensive MRSA colonization surveillance program. It is not intended to diagnose MRSA infection nor to guide or monitor treatment for MRSA infections. Performed at [facility], [address] [place],  [address]     Coagulation Studies: No results for input(s): LABPROT, INR in the last 72 hours.  Urinalysis: No results for input(s): COLORURINE, LABSPEC, PHURINE, GLUCOSEU, HGBUR, BILIRUBINUR, KETONESUR, PROTEINUR, UROBILINOGEN, NITRITE, LEUKOCYTESUR in the last 72 hours.  Invalid input(s): APPERANCEUR    Imaging: Ct Chest W Contrast  Result Date: 09/26/2018 CLINICAL DATA:  Sepsis and fever. Chronic anemia. End-stage renal disease on dialysis. EXAM: CT CHEST, ABDOMEN, AND PELVIS WITH CONTRAST TECHNIQUE: Multidetector CT imaging of the chest, abdomen and pelvis was performed following the standard protocol during bolus administration of intravenous contrast. CONTRAST:  180mL ISOVUE-300 IOPAMIDOL (ISOVUE-300) INJECTION 61% COMPARISON:  CT abdomen from 05/31/2009 FINDINGS: CT CHEST FINDINGS Cardiovascular: Coronary, aortic arch, and branch vessel atherosclerotic vascular disease. Calcifications of the mitral and aortic valves. Moderate to large pericardial effusion. Moderate cardiomegaly. Mediastinum/Nodes: Scattered small mediastinal lymph nodes, not pathologically enlarged. Lungs/Pleura: Airway thickening is present, suggesting bronchitis or reactive airways disease. Mild atelectasis in the lingula. 4 mm in diameter calcified granuloma in the posterior basal segment left lower lobe on image 104/4. Musculoskeletal: Asymmetric gynecomastia, notably more striking on the left than the right. 2.9 by 1.1 cm sclerotic lesion laterally in the left sixth rib, previously slightly more lucent centrally back on 05/31/2009 but otherwise similar in size. Thoracic spondylosis and diffuse mild thoracic sclerosis likely from renal osteodystrophy. Likely chronic mild superior  endplate compression fracture at T9. CT ABDOMEN PELVIS FINDINGS Hepatobiliary: A rim enhancing lesion in the left hepatic lobe measures 2.2 by 1.6 cm. There is a more diffusely enhancing lesion in this vicinity on 05/31/2009 measuring 1.6 by 1.3 cm. Gallbladder wall thickening is present along with mild contraction of the gallbladder. Pancreas: Unremarkable Spleen: Unremarkable Adrenals/Urinary Tract: Numerous cysts are present in both kidneys and demonstrate a variable degree of complexity fullness of both adrenal glands without discrete mass. No urinary tract calculi observed. Stomach/Bowel: Mild prominence of stool in the colon and rectum. Vascular/Lymphatic: Aortoiliac atherosclerotic vascular disease. Left external iliac node 1.2 cm in short axis on image 116/2, formerly 0.9 cm. Reproductive: Unremarkable Other: Asymmetric subcutaneous edema in the right upper thigh. Musculoskeletal: Diffuse bony sclerosis. Fused sacroiliac joints. Age-indeterminate per probably late subacute superior endplate compression fracture at L2 with 25% loss of height. IMPRESSION: 1. Moderate to large pericardial effusion, not present on 05/31/2009. Correlate with any jugular venous distention or signs of Beck's triad in assessing for tamponade. 2. There is subcutaneous  edema in the right lateral thigh which appears asymmetrically increased compared to the left, but is otherwise nonspecific. There is also a mildly enlarged right external iliac lymph node. Strictly speaking an infectious process such as cellulitis is not excluded, correlate with physical exam findings in determining need for further workup. 3. 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 Some of the renal cystic lesions are complex but otherwise technically nonspecific. 4. Renal osteodystrophy. 5. Late subacute superior endplate compression fracture at L2. 6. Aortic Atherosclerosis (ICD10-I70.0). Coronary atherosclerosis with mitral and aortic valve calcification  as well as cardiomegaly. 7. Airway thickening is present, suggesting bronchitis or reactive airways disease. 8. Asymmetric gynecomastia, striking on the left side. 9. Rim enhancing lesion in the left hepatic lobe has mildly enlarged compared to the previous diffusely enhancing lesion shown on 05/31/2009. The mild change over an intervening 9 year period favors a benign etiology such as hemangioma, although lesion is technically nonspecific. 10.  Prominent stool throughout the colon favors constipation. The top 2 impressions, which are likely the most relevant for the patient's immediate clinical care, will be called to the ordering clinician or representative by the Radiologist Assistant, and communication documented in the PACS or zVision Dashboard. Electronically Signed   By: Van Clines M.D.   On: 09/26/2018 19:18   Ct Abdomen Pelvis W Contrast  Result Date: 09/26/2018 CLINICAL DATA:  Sepsis and fever. Chronic anemia. End-stage renal disease on dialysis. EXAM: CT CHEST, ABDOMEN, AND PELVIS WITH CONTRAST TECHNIQUE: Multidetector CT imaging of the chest, abdomen and pelvis was performed following the standard protocol during bolus administration of intravenous contrast. CONTRAST:  134mL ISOVUE-300 IOPAMIDOL (ISOVUE-300) INJECTION 61% COMPARISON:  CT abdomen from 05/31/2009 FINDINGS: CT CHEST FINDINGS Cardiovascular: Coronary, aortic arch, and branch vessel atherosclerotic vascular disease. Calcifications of the mitral and aortic valves. Moderate to large pericardial effusion. Moderate cardiomegaly. Mediastinum/Nodes: Scattered small mediastinal lymph nodes, not pathologically enlarged. Lungs/Pleura: Airway thickening is present, suggesting bronchitis or reactive airways disease. Mild atelectasis in the lingula. 4 mm in diameter calcified granuloma in the posterior basal segment left lower lobe on image 104/4. Musculoskeletal: Asymmetric gynecomastia, notably more striking on the left than the right. 2.9  by 1.1 cm sclerotic lesion laterally in the left sixth rib, previously slightly more lucent centrally back on 05/31/2009 but otherwise similar in size. Thoracic spondylosis and diffuse mild thoracic sclerosis likely from renal osteodystrophy. Likely chronic mild superior endplate compression fracture at T9. CT ABDOMEN PELVIS FINDINGS Hepatobiliary: A rim enhancing lesion in the left hepatic lobe measures 2.2 by 1.6 cm. There is a more diffusely enhancing lesion in this vicinity on 05/31/2009 measuring 1.6 by 1.3 cm. Gallbladder wall thickening is present along with mild contraction of the gallbladder. Pancreas: Unremarkable Spleen: Unremarkable Adrenals/Urinary Tract: Numerous cysts are present in both kidneys and demonstrate a variable degree of complexity fullness of both adrenal glands without discrete mass. No urinary tract calculi observed. Stomach/Bowel: Mild prominence of stool in the colon and rectum. Vascular/Lymphatic: Aortoiliac atherosclerotic vascular disease. Left external iliac node 1.2 cm in short axis on image 116/2, formerly 0.9 cm. Reproductive: Unremarkable Other: Asymmetric subcutaneous edema in the right upper thigh. Musculoskeletal: Diffuse bony sclerosis. Fused sacroiliac joints. Age-indeterminate per probably late subacute superior endplate compression fracture at L2 with 25% loss of height. IMPRESSION: 1. Moderate to large pericardial effusion, not present on 05/31/2009. Correlate with any jugular venous distention or signs of Beck's triad in assessing for tamponade. 2. There is subcutaneous edema in the right lateral thigh  which appears asymmetrically increased compared to the left, but is otherwise nonspecific. There is also a mildly enlarged right external iliac lymph node. Strictly speaking an infectious process such as cellulitis is not excluded, correlate with physical exam findings in determining need for further workup. 3. 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 Some of  the renal cystic lesions are complex but otherwise technically nonspecific. 4. Renal osteodystrophy. 5. Late subacute superior endplate compression fracture at L2. 6. Aortic Atherosclerosis (ICD10-I70.0). Coronary atherosclerosis with mitral and aortic valve calcification as well as cardiomegaly. 7. Airway thickening is present, suggesting bronchitis or reactive airways disease. 8. Asymmetric gynecomastia, striking on the left side. 9. Rim enhancing lesion in the left hepatic lobe has mildly enlarged compared to the previous diffusely enhancing lesion shown on 05/31/2009. The mild change over an intervening 9 year period favors a benign etiology such as hemangioma, although lesion is technically nonspecific. 10.  Prominent stool throughout the colon favors constipation. The top 2 impressions, which are likely the most relevant for the patient's immediate clinical care, will be called to the ordering clinician or representative by the Radiologist Assistant, and communication documented in the PACS or zVision Dashboard. Electronically Signed   By: Van Clines M.D.   On: 09/26/2018 19:18     Medications:   . sodium chloride Stopped (09/24/18 1955)  . vancomycin Stopped (09/26/18 1947)   . sodium chloride   Intravenous Once  . sodium chloride   Intravenous Once  . amiodarone  400 mg Oral BID  . aspirin EC  81 mg Oral Daily  . cinacalcet  60 mg Oral Once per day on Mon Wed Fri  . ciprofloxacin-dexamethasone  4 drop Right EAR BID  . epoetin (EPOGEN/PROCRIT) injection  10,000 Units Intravenous Q M,W,F-HD  . feeding supplement (NEPRO CARB STEADY)  237 mL Oral BID BM  . feeding supplement (PRO-STAT SUGAR FREE 64)  30 mL Oral Daily  . ferrous sulfate  325 mg Oral TID WC  . Gerhardt's butt cream   Topical QID  . glipiZIDE  10 mg Oral Q breakfast  . insulin aspart  0-15 Units Subcutaneous TID WC  . insulin aspart  0-5 Units Subcutaneous QHS  . metoprolol tartrate  12.5 mg Oral BID  . midodrine  5  mg Oral TID WC  . multivitamin  1 tablet Oral QHS  . pantoprazole  40 mg Oral Daily  . polyethylene glycol  17 g Oral BID  . senna-docusate  1 tablet Oral BID  . sevelamer carbonate  2,400 mg Oral TID WC  . sodium chloride flush  3 mL Intravenous Q12H  . sodium polystyrene  30 g Oral Once   sodium chloride, acetaminophen, hydrALAZINE, lidocaine-prilocaine, metoCLOPramide (REGLAN) injection, metoprolol tartrate, midodrine, ondansetron **OR** ondansetron (ZOFRAN) IV, oxyCODONE, sodium chloride flush  Assessment/ Plan:  [name] is a 57 y.o. Hispanic (Spanish speaking only) malewith end stage renal disease, peripheral vascular disease, hypertension, CVA, hyperlipidemia, diabetes mellitus type II who is admitted to [facility] 106.5 kg Left AVF  1. End Stage Renal Disease: Schedule extra hemodialysis treatment for today. UF goal of 2.5-3 liters     2. Anemia of chronic kidney disease: status post PRBC transfusions. Hemoglobin 7.1 - schedule PRBC transfusion.  - EPO with HD treatment  3. Hypotension: with large pericardial effusion. Discussed case with cardiology. Repeat echocardiogram for today - Midodrine   - Appreciate cards input.   4. Secondary Hyperparathyroidism:  -  continue Renvela and Sensipar with meals.  - Cinacalcet three times weekly.   5. Ear pain: empiric ciprofloxacin/dexamethasone otic solution.   6. Chest pain with atrial fibrillation: with elevated cardiac enzymes thought to be demand ischemia - Appreciate cardiology input. Echocardiogram as above.   7. Sepsis:  Appreciate ID input. Outpatient cultures no growth.  - Vancomycin  8. Jerking - off gabapentin - Appreciate neurology   LOS: 6 Rishan Oyama 12/14/201911:[id number] AM

## 2018-09-27 NOTE — Progress Notes (Signed)
This note also relates to the following rows which could not be included: Pulse Rate - Cannot attach notes to unvalidated device data Resp - Cannot attach notes to unvalidated device data BP - Cannot attach notes to unvalidated device data  Hd started  

## 2018-09-27 NOTE — Progress Notes (Addendum)
Progress Note  Patient Name: [name] Date of Encounter: 09/27/2018  Primary Cardiologist: Kathlyn Sacramento, MD   Subjective   No pain.  Feels better than yesterday.  Inpatient Medications    Scheduled Meds: . sodium chloride   Intravenous Once  . sodium chloride   Intravenous Once  . amiodarone  400 mg Oral BID  . aspirin EC  81 mg Oral Daily  . cinacalcet  60 mg Oral Once per day on Mon Wed Fri  . ciprofloxacin-dexamethasone  4 drop Right EAR BID  . epoetin (EPOGEN/PROCRIT) injection  10,000 Units Intravenous Q M,W,F-HD  . feeding supplement (NEPRO CARB STEADY)  237 mL Oral BID BM  . feeding supplement (PRO-STAT SUGAR FREE 64)  30 mL Oral Daily  . ferrous sulfate  325 mg Oral TID WC  . Gerhardt's butt cream   Topical QID  . glipiZIDE  10 mg Oral Q breakfast  . insulin aspart  0-15 Units Subcutaneous TID WC  . insulin aspart  0-5 Units Subcutaneous QHS  . metoprolol tartrate  12.5 mg Oral BID  . midodrine  5 mg Oral TID WC  . multivitamin  1 tablet Oral QHS  . pantoprazole  40 mg Oral Daily  . polyethylene glycol  17 g Oral BID  . senna-docusate  1 tablet Oral BID  . sevelamer carbonate  2,400 mg Oral TID WC  . sodium chloride flush  3 mL Intravenous Q12H  . sodium polystyrene  30 g Oral Once   Continuous Infusions: . sodium chloride Stopped (09/24/18 1955)  . vancomycin Stopped (09/26/18 1947)   PRN Meds: sodium chloride, acetaminophen, hydrALAZINE, lidocaine-prilocaine, metoCLOPramide (REGLAN) injection, metoprolol tartrate, midodrine, ondansetron **OR** ondansetron (ZOFRAN) IV, oxyCODONE, sodium chloride flush   Vital Signs    Vitals:   09/26/18 1920 09/27/18 0408 09/27/18 0958 09/27/18 1151  BP: 105/60 (!) 105/57 (!) 91/48 125/66  Pulse: 73 65 95 (!) 140  Resp: 18 20  20   Temp: 98.8 F (37.1 C) 98.1 F (36.7 C)  98.5 F (36.9 C)  TempSrc: Oral Oral  Oral  SpO2: 97% 96%  92%  Weight:  116.6 kg    Height:        Intake/Output Summary (Last 24  hours) at 09/27/2018 1310 Last data filed at 09/27/2018 0805 Gross per 24 hour  Intake 410.85 ml  Output 1008 ml  Net -597.15 ml   Filed Weights   09/24/18 0917 09/24/18 1314 09/27/18 0408  Weight: 115.4 kg 115.1 kg 116.6 kg    Telemetry    NSR - Personally Reviewed  ECG    None recent  Physical Exam   GEN: No acute distress.   Neck: No JVD Cardiac: RRR, distant heart sounds Respiratory:  No audible wheezing GI: Soft, nontender, non-distended  MS:  Leg amputations Neuro:  Nonfocal  Psych: Normal affect   Labs    Chemistry Recent Labs  Lab 09/22/18 0838  09/25/18 0523 09/26/18 0327 09/27/18 0552  NA 132*   < > 135 133* 131*  K 5.9*   < > 4.9 5.7* 5.2*  CL 95*   < > 95* 94* 91*  CO2 25   < > 28 27 28   GLUCOSE 158*   < > 208* 235* 186*  BUN 78*   < > 57* 78* 55*  CREATININE 8.93*   < > 5.69* 7.02* 5.56*  CALCIUM 8.2*   < > 9.1 9.7 9.5  ALBUMIN 2.8*  --   --   --   --  GFRNONAA 6*   < > 10* 8* 10*  GFRAA 7*   < > 12* 9* 12*  ANIONGAP 12   < > 12 12 12    < > = values in this interval not displayed.     Hematology Recent Labs  Lab 09/25/18 0523 09/26/18 0327 09/27/18 0552  WBC 13.5* 15.2* 13.4*  RBC 2.50* 2.52* 2.45*  HGB 7.2* 7.3* 7.1*  HCT 24.4* 25.1* 24.3*  MCV 97.6 99.6 99.2  MCH 28.8 29.0 29.0  MCHC 29.5* 29.1* 29.2*  RDW 17.2* 16.6* 16.5*  PLT 430* 440* 403*    Cardiac Enzymes Recent Labs  Lab 09/23/18 0318 09/23/18 0836 09/23/18 1449 09/23/18 1952  TROPONINI 0.25* 0.26* 0.20* 0.18*   No results for input(s): TROPIPOC in the last 168 hours.   BNPNo results for input(s): BNP, PROBNP in the last 168 hours.   DDimer No results for input(s): DDIMER in the last 168 hours.   Radiology    Ct Chest W Contrast  Result Date: 09/26/2018 CLINICAL DATA:  Sepsis and fever. Chronic anemia. End-stage renal disease on dialysis. EXAM: CT CHEST, ABDOMEN, AND PELVIS WITH CONTRAST TECHNIQUE: Multidetector CT imaging of the chest, abdomen and  pelvis was performed following the standard protocol during bolus administration of intravenous contrast. CONTRAST:  177mL ISOVUE-300 IOPAMIDOL (ISOVUE-300) INJECTION 61% COMPARISON:  CT abdomen from 05/31/2009 FINDINGS: CT CHEST FINDINGS Cardiovascular: Coronary, aortic arch, and branch vessel atherosclerotic vascular disease. Calcifications of the mitral and aortic valves. Moderate to large pericardial effusion. Moderate cardiomegaly. Mediastinum/Nodes: Scattered small mediastinal lymph nodes, not pathologically enlarged. Lungs/Pleura: Airway thickening is present, suggesting bronchitis or reactive airways disease. Mild atelectasis in the lingula. 4 mm in diameter calcified granuloma in the posterior basal segment left lower lobe on image 104/4. Musculoskeletal: Asymmetric gynecomastia, notably more striking on the left than the right. 2.9 by 1.1 cm sclerotic lesion laterally in the left sixth rib, previously slightly more lucent centrally back on 05/31/2009 but otherwise similar in size. Thoracic spondylosis and diffuse mild thoracic sclerosis likely from renal osteodystrophy. Likely chronic mild superior endplate compression fracture at T9. CT ABDOMEN PELVIS FINDINGS Hepatobiliary: A rim enhancing lesion in the left hepatic lobe measures 2.2 by 1.6 cm. There is a more diffusely enhancing lesion in this vicinity on 05/31/2009 measuring 1.6 by 1.3 cm. Gallbladder wall thickening is present along with mild contraction of the gallbladder. Pancreas: Unremarkable Spleen: Unremarkable Adrenals/Urinary Tract: Numerous cysts are present in both kidneys and demonstrate a variable degree of complexity fullness of both adrenal glands without discrete mass. No urinary tract calculi observed. Stomach/Bowel: Mild prominence of stool in the colon and rectum. Vascular/Lymphatic: Aortoiliac atherosclerotic vascular disease. Left external iliac node 1.2 cm in short axis on image 116/2, formerly 0.9 cm. Reproductive: Unremarkable  Other: Asymmetric subcutaneous edema in the right upper thigh. Musculoskeletal: Diffuse bony sclerosis. Fused sacroiliac joints. Age-indeterminate per probably late subacute superior endplate compression fracture at L2 with 25% loss of height. IMPRESSION: 1. Moderate to large pericardial effusion, not present on 05/31/2009. Correlate with any jugular venous distention or signs of Beck's triad in assessing for tamponade. 2. There is subcutaneous edema in the right lateral thigh which appears asymmetrically increased compared to the left, but is otherwise nonspecific. There is also a mildly enlarged right external iliac lymph node. Strictly speaking an infectious process such as cellulitis is not excluded, correlate with physical exam findings in determining need for further workup. 3. 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 Some of the renal cystic  lesions are complex but otherwise technically nonspecific. 4. Renal osteodystrophy. 5. Late subacute superior endplate compression fracture at L2. 6. Aortic Atherosclerosis (ICD10-I70.0). Coronary atherosclerosis with mitral and aortic valve calcification as well as cardiomegaly. 7. Airway thickening is present, suggesting bronchitis or reactive airways disease. 8. Asymmetric gynecomastia, striking on the left side. 9. Rim enhancing lesion in the left hepatic lobe has mildly enlarged compared to the previous diffusely enhancing lesion shown on 05/31/2009. The mild change over an intervening 9 year period favors a benign etiology such as hemangioma, although lesion is technically nonspecific. 10.  Prominent stool throughout the colon favors constipation. The top 2 impressions, which are likely the most relevant for the patient's immediate clinical care, will be called to the ordering clinician or representative by the Radiologist Assistant, and communication documented in the PACS or zVision Dashboard. Electronically Signed   By: Van Clines M.D.   On:  09/26/2018 19:18   Ct Abdomen Pelvis W Contrast  Result Date: 09/26/2018 CLINICAL DATA:  Sepsis and fever. Chronic anemia. End-stage renal disease on dialysis. EXAM: CT CHEST, ABDOMEN, AND PELVIS WITH CONTRAST TECHNIQUE: Multidetector CT imaging of the chest, abdomen and pelvis was performed following the standard protocol during bolus administration of intravenous contrast. CONTRAST:  147mL ISOVUE-300 IOPAMIDOL (ISOVUE-300) INJECTION 61% COMPARISON:  CT abdomen from 05/31/2009 FINDINGS: CT CHEST FINDINGS Cardiovascular: Coronary, aortic arch, and branch vessel atherosclerotic vascular disease. Calcifications of the mitral and aortic valves. Moderate to large pericardial effusion. Moderate cardiomegaly. Mediastinum/Nodes: Scattered small mediastinal lymph nodes, not pathologically enlarged. Lungs/Pleura: Airway thickening is present, suggesting bronchitis or reactive airways disease. Mild atelectasis in the lingula. 4 mm in diameter calcified granuloma in the posterior basal segment left lower lobe on image 104/4. Musculoskeletal: Asymmetric gynecomastia, notably more striking on the left than the right. 2.9 by 1.1 cm sclerotic lesion laterally in the left sixth rib, previously slightly more lucent centrally back on 05/31/2009 but otherwise similar in size. Thoracic spondylosis and diffuse mild thoracic sclerosis likely from renal osteodystrophy. Likely chronic mild superior endplate compression fracture at T9. CT ABDOMEN PELVIS FINDINGS Hepatobiliary: A rim enhancing lesion in the left hepatic lobe measures 2.2 by 1.6 cm. There is a more diffusely enhancing lesion in this vicinity on 05/31/2009 measuring 1.6 by 1.3 cm. Gallbladder wall thickening is present along with mild contraction of the gallbladder. Pancreas: Unremarkable Spleen: Unremarkable Adrenals/Urinary Tract: Numerous cysts are present in both kidneys and demonstrate a variable degree of complexity fullness of both adrenal glands without discrete  mass. No urinary tract calculi observed. Stomach/Bowel: Mild prominence of stool in the colon and rectum. Vascular/Lymphatic: Aortoiliac atherosclerotic vascular disease. Left external iliac node 1.2 cm in short axis on image 116/2, formerly 0.9 cm. Reproductive: Unremarkable Other: Asymmetric subcutaneous edema in the right upper thigh. Musculoskeletal: Diffuse bony sclerosis. Fused sacroiliac joints. Age-indeterminate per probably late subacute superior endplate compression fracture at L2 with 25% loss of height. IMPRESSION: 1. Moderate to large pericardial effusion, not present on 05/31/2009. Correlate with any jugular venous distention or signs of Beck's triad in assessing for tamponade. 2. There is subcutaneous edema in the right lateral thigh which appears asymmetrically increased compared to the left, but is otherwise nonspecific. There is also a mildly enlarged right external iliac lymph node. Strictly speaking an infectious process such as cellulitis is not excluded, correlate with physical exam findings in determining need for further workup. 3. 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 Some of the renal cystic lesions are complex but otherwise technically  nonspecific. 4. Renal osteodystrophy. 5. Late subacute superior endplate compression fracture at L2. 6. Aortic Atherosclerosis (ICD10-I70.0). Coronary atherosclerosis with mitral and aortic valve calcification as well as cardiomegaly. 7. Airway thickening is present, suggesting bronchitis or reactive airways disease. 8. Asymmetric gynecomastia, striking on the left side. 9. Rim enhancing lesion in the left hepatic lobe has mildly enlarged compared to the previous diffusely enhancing lesion shown on 05/31/2009. The mild change over an intervening 9 year period favors a benign etiology such as hemangioma, although lesion is technically nonspecific. 10.  Prominent stool throughout the colon favors constipation. The top 2 impressions, which are  likely the most relevant for the patient's immediate clinical care, will be called to the ordering clinician or representative by the Radiologist Assistant, and communication documented in the PACS or zVision Dashboard. Electronically Signed   By: Van Clines M.D.   On: 09/26/2018 19:18    Cardiac Studies   I personally reviewed the echo images while they are being performed.  Small effusion.  There is a pocket of fluid in front of the right ventricle.  There does not appear to be any RV compression or other signs of tamponade.  IVC is somewhat dilated, which may be related to volume overload in his case.  Patient Profile     57 y.o. male A. fib, peripheral arterial disease, pericardial effusion  Assessment & Plan    1) atrial fibrillation: Now in sinus rhythm.  Continue amiodarone load.  Load was outlined in Dr. Tyrell Antonio note from 12/13.  2) pericardial effusion: This does not appear to be causing any hemodynamic compromise.  It appears small to moderate by echo.  Await final read of the echo.  No indication for pericardiocentesis at this time.  D/w Dr. Juleen China.  3) ESRD: Continue with plans for dialysis.       For questions or updates, please contact [place] Please consult [email or web address] for contact info under        Signed, Larae Grooms, MD  09/27/2018, 1:10 PM

## 2018-09-27 NOTE — Progress Notes (Signed)
Blood start

## 2018-09-27 NOTE — Progress Notes (Signed)
This note also relates to the following rows which could not be included: ECG Heart Rate - Cannot attach notes to unvalidated device data  Blood complete

## 2018-09-27 NOTE — Progress Notes (Signed)
[facility]: [name]    MR#:  [id number]  DATE OF BIRTH:  1960-12-30  SUBJECTIVE:   Tremors better Wife reporting that patient is not getting enough fluid off during dialysis  REVIEW OF SYSTEMS:    Review of Systems  Constitutional: No fever, NOchills weight loss HENT: Negative for ear pain, nosebleeds, congestion, facial swelling, rhinorrhea, neck pain, neck stiffness and ear discharge.   Respiratory: Negative for cough, ++shortness of breath, no wheezing  Cardiovascular: Negative for chest pain, palpitations and leg swelling.  Gastrointestinal: Negative for heartburn, abdominal pain, vomiting, diarrhea or consitpation Genitourinary: Negative for dysuria, urgency, frequency, hematuria Musculoskeletal: Negative for back pain or joint pain Neurological: Negative for dizziness, seizures, syncope, focal weakness,  numbness and headaches.  Hematological: Does not bruise/bleed easily.  Psychiatric/Behavioral: Negative for hallucinations, confusion, dysphoric mood    Tolerating Diet: yes      DRUG ALLERGIES:  No Known Allergies  VITALS:  Blood pressure 125/66, pulse (!) 140, temperature 98.5 F (36.9 C), temperature source Oral, resp. rate 20, height 5\' 10"  (1.778 m), weight 116.6 kg, SpO2 92 %.  PHYSICAL EXAMINATION:  Constitutional: Appears well-developed and well-nourished. No distress. HENT: Normocephalic. Marland Kitchen Oropharynx is clear and moist.  Eyes: Conjunctivae and EOM are normal. PERRLA, no scleral icterus.  Neck: Normal ROM. Neck supple. No JVD. No tracheal deviation. CVSrrr S1/S2 +, no murmurs, no gallops, no carotid bruit.  Pulmonary: Effort and breath sounds normal, no stridor, rhonchi, wheezes, rales.  Abdominal: Soft. BS +,  no distension, tenderness, rebound or guarding.  Musculoskeletal: Right AKA left BKA neuro: Alert. CN 2-12 grossly intact. No focal deficits. Skin: Skin is warm and dry. No rash  noted. Psychiatric: Normal mood and affect.      LABORATORY PANEL:   CBC Recent Labs  Lab 09/27/18 0552  WBC 13.4*  HGB 7.1*  HCT 24.3*  PLT 403*   ------------------------------------------------------------------------------------------------------------------  Chemistries  Recent Labs  Lab 09/27/18 0552  NA 131*  K 5.2*  CL 91*  CO2 28  GLUCOSE 186*  BUN 55*  CREATININE 5.56*  CALCIUM 9.5   ------------------------------------------------------------------------------------------------------------------  Cardiac Enzymes Recent Labs  Lab 09/23/18 0836 09/23/18 1449 09/23/18 1952  TROPONINI 0.26* 0.20* 0.18*   ------------------------------------------------------------------------------------------------------------------  RADIOLOGY:  Ct Chest W Contrast  Result Date: 09/26/2018 CLINICAL DATA:  Sepsis and fever. Chronic anemia. End-stage renal disease on dialysis. EXAM: CT CHEST, ABDOMEN, AND PELVIS WITH CONTRAST TECHNIQUE: Multidetector CT imaging of the chest, abdomen and pelvis was performed following the standard protocol during bolus administration of intravenous contrast. CONTRAST:  168mL ISOVUE-300 IOPAMIDOL (ISOVUE-300) INJECTION 61% COMPARISON:  CT abdomen from 05/31/2009 FINDINGS: CT CHEST FINDINGS Cardiovascular: Coronary, aortic arch, and branch vessel atherosclerotic vascular disease. Calcifications of the mitral and aortic valves. Moderate to large pericardial effusion. Moderate cardiomegaly. Mediastinum/Nodes: Scattered small mediastinal lymph nodes, not pathologically enlarged. Lungs/Pleura: Airway thickening is present, suggesting bronchitis or reactive airways disease. Mild atelectasis in the lingula. 4 mm in diameter calcified granuloma in the posterior basal segment left lower lobe on image 104/4. Musculoskeletal: Asymmetric gynecomastia, notably more striking on the left than the right. 2.9 by 1.1 cm sclerotic lesion laterally in the left sixth  rib, previously slightly more lucent centrally back on 05/31/2009 but otherwise similar in size. Thoracic spondylosis and diffuse mild thoracic sclerosis likely from renal osteodystrophy. Likely chronic mild superior endplate compression fracture at T9. CT ABDOMEN PELVIS FINDINGS Hepatobiliary: A rim enhancing lesion in the left  hepatic lobe measures 2.2 by 1.6 cm. There is a more diffusely enhancing lesion in this vicinity on 05/31/2009 measuring 1.6 by 1.3 cm. Gallbladder wall thickening is present along with mild contraction of the gallbladder. Pancreas: Unremarkable Spleen: Unremarkable Adrenals/Urinary Tract: Numerous cysts are present in both kidneys and demonstrate a variable degree of complexity fullness of both adrenal glands without discrete mass. No urinary tract calculi observed. Stomach/Bowel: Mild prominence of stool in the colon and rectum. Vascular/Lymphatic: Aortoiliac atherosclerotic vascular disease. Left external iliac node 1.2 cm in short axis on image 116/2, formerly 0.9 cm. Reproductive: Unremarkable Other: Asymmetric subcutaneous edema in the right upper thigh. Musculoskeletal: Diffuse bony sclerosis. Fused sacroiliac joints. Age-indeterminate per probably late subacute superior endplate compression fracture at L2 with 25% loss of height. IMPRESSION: 1. Moderate to large pericardial effusion, not present on 05/31/2009. Correlate with any jugular venous distention or signs of Beck's triad in assessing for tamponade. 2. There is subcutaneous edema in the right lateral thigh which appears asymmetrically increased compared to the left, but is otherwise nonspecific. There is also a mildly enlarged right external iliac lymph node. Strictly speaking an infectious process such as cellulitis is not excluded, correlate with physical exam findings in determining need for further workup. 3. 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 Some of the renal cystic lesions are complex but otherwise  technically nonspecific. 4. Renal osteodystrophy. 5. Late subacute superior endplate compression fracture at L2. 6. Aortic Atherosclerosis (ICD10-I70.0). Coronary atherosclerosis with mitral and aortic valve calcification as well as cardiomegaly. 7. Airway thickening is present, suggesting bronchitis or reactive airways disease. 8. Asymmetric gynecomastia, striking on the left side. 9. Rim enhancing lesion in the left hepatic lobe has mildly enlarged compared to the previous diffusely enhancing lesion shown on 05/31/2009. The mild change over an intervening 9 year period favors a benign etiology such as hemangioma, although lesion is technically nonspecific. 10.  Prominent stool throughout the colon favors constipation. The top 2 impressions, which are likely the most relevant for the patient's immediate clinical care, will be called to the ordering clinician or representative by the Radiologist Assistant, and communication documented in the PACS or zVision Dashboard. Electronically Signed   By: Van Clines M.D.   On: 09/26/2018 19:18   Ct Abdomen Pelvis W Contrast  Result Date: 09/26/2018 CLINICAL DATA:  Sepsis and fever. Chronic anemia. End-stage renal disease on dialysis. EXAM: CT CHEST, ABDOMEN, AND PELVIS WITH CONTRAST TECHNIQUE: Multidetector CT imaging of the chest, abdomen and pelvis was performed following the standard protocol during bolus administration of intravenous contrast. CONTRAST:  133mL ISOVUE-300 IOPAMIDOL (ISOVUE-300) INJECTION 61% COMPARISON:  CT abdomen from 05/31/2009 FINDINGS: CT CHEST FINDINGS Cardiovascular: Coronary, aortic arch, and branch vessel atherosclerotic vascular disease. Calcifications of the mitral and aortic valves. Moderate to large pericardial effusion. Moderate cardiomegaly. Mediastinum/Nodes: Scattered small mediastinal lymph nodes, not pathologically enlarged. Lungs/Pleura: Airway thickening is present, suggesting bronchitis or reactive airways disease. Mild  atelectasis in the lingula. 4 mm in diameter calcified granuloma in the posterior basal segment left lower lobe on image 104/4. Musculoskeletal: Asymmetric gynecomastia, notably more striking on the left than the right. 2.9 by 1.1 cm sclerotic lesion laterally in the left sixth rib, previously slightly more lucent centrally back on 05/31/2009 but otherwise similar in size. Thoracic spondylosis and diffuse mild thoracic sclerosis likely from renal osteodystrophy. Likely chronic mild superior endplate compression fracture at T9. CT ABDOMEN PELVIS FINDINGS Hepatobiliary: A rim enhancing lesion in the left hepatic lobe measures 2.2 by 1.6  cm. There is a more diffusely enhancing lesion in this vicinity on 05/31/2009 measuring 1.6 by 1.3 cm. Gallbladder wall thickening is present along with mild contraction of the gallbladder. Pancreas: Unremarkable Spleen: Unremarkable Adrenals/Urinary Tract: Numerous cysts are present in both kidneys and demonstrate a variable degree of complexity fullness of both adrenal glands without discrete mass. No urinary tract calculi observed. Stomach/Bowel: Mild prominence of stool in the colon and rectum. Vascular/Lymphatic: Aortoiliac atherosclerotic vascular disease. Left external iliac node 1.2 cm in short axis on image 116/2, formerly 0.9 cm. Reproductive: Unremarkable Other: Asymmetric subcutaneous edema in the right upper thigh. Musculoskeletal: Diffuse bony sclerosis. Fused sacroiliac joints. Age-indeterminate per probably late subacute superior endplate compression fracture at L2 with 25% loss of height. IMPRESSION: 1. Moderate to large pericardial effusion, not present on 05/31/2009. Correlate with any jugular venous distention or signs of Beck's triad in assessing for tamponade. 2. There is subcutaneous edema in the right lateral thigh which appears asymmetrically increased compared to the left, but is otherwise nonspecific. There is also a mildly enlarged right external iliac lymph  node. Strictly speaking an infectious process such as cellulitis is not excluded, correlate with physical exam findings in determining need for further workup. 3. 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 Some of the renal cystic lesions are complex but otherwise technically nonspecific. 4. Renal osteodystrophy. 5. Late subacute superior endplate compression fracture at L2. 6. Aortic Atherosclerosis (ICD10-I70.0). Coronary atherosclerosis with mitral and aortic valve calcification as well as cardiomegaly. 7. Airway thickening is present, suggesting bronchitis or reactive airways disease. 8. Asymmetric gynecomastia, striking on the left side. 9. Rim enhancing lesion in the left hepatic lobe has mildly enlarged compared to the previous diffusely enhancing lesion shown on 05/31/2009. The mild change over an intervening 9 year period favors a benign etiology such as hemangioma, although lesion is technically nonspecific. 10.  Prominent stool throughout the colon favors constipation. The top 2 impressions, which are likely the most relevant for the patient's immediate clinical care, will be called to the ordering clinician or representative by the Radiologist Assistant, and communication documented in the PACS or zVision Dashboard. Electronically Signed   By: Van Clines M.D.   On: 09/26/2018 19:18     ASSESSMENT AND PLAN:   57 year old male with end-stage renal disease on hemodialysis, peripheral vascular disease with recent AKA, diabetes  who presented to the ER due to fever.  1.  Sepsis: Patient presented with fever and leukocytosis. Sepsis from unclear etiology with negative chest x-ray. Right ear with otitis media Cultures have been negative. ID and vascular consultation appreciated Continue Cipro OTIC eardrops for otitis media and vancomycin empirically for total 14-day course given some concern for HD access infection although the site looks good him cultures have been negative as per  ID.  Stop date for vancomycin is December 22. CT scan of the abdomen and chest as above.   2.  Acute on chronic anemia: Patient is status post 2 unit PRBC Discussed with cardiology and we will discontinue Eliquis.   Continue ferrous sulfate Patient scheduled for 1 more unit of blood today.  3.  History of hypotension Continue midodrine before dialysis treatments  4.  Peripheral artery disease status post recent right above-knee amputation: Vascular surgery consultation appreciated.  Stump looks clean and intact and is not source of sepsis.   5.  Diabetes: Continue current care with ADA diet  6.  PAF: Continue amiodarone Currently in normal sinus rhythm.  7.  End-stage renal  disease on hemodialysis: Continue dialysis as per schedule by nephrology  8.  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     9.  Chest pain: This pain is due to atrial fibrillation and anemia.  Patient ruled out for ACS. Cardiology consultation appreciated.   10.  Tremor: EEG was negative for seizure activity.  Gabapentin has been discontinued by neurology.    11.  Otitis media: Continue Cipro drops 12.  Pericardial effusion seen on CT scan: Management as per cardiology.  Discussed with Dr. Juleen China and Dr. Ola Spurr Management plans discussed with the patient and wife he is in agreement.  CODE STATUS: Full  TOTAL TIME TAKING CARE OF THIS PATIENT: 26 minutes.   Physical therapy is recommending skilled nursing facility Palliative care consultation for goals of care appreciated.   POSSIBLE D/C 2 days, DEPENDING ON CLINICAL CONDITION.   [name] M.D on 09/27/2018 at 12:24 PM  Between 7am to 6pm - Pager - [phone] After 6pm go to [email or web address] - password [email or web address] [facility]  Office  [phone]  CC: Primary care physician; [name], MD  Note: This dictation was prepared with Dragon dictation along with smaller phrase technology. Any transcriptional errors that result from this process are unintentional.

## 2018-09-27 NOTE — Progress Notes (Signed)
[facility] INFECTIOUS DISEASE PROGRESS NOTE Date of Admission:  09/21/2018     ID: [name] is a 57 y.o. male with fevers, ESRD, anemia s/p transfusion, recent BKA Principal Problem:   Sepsis ([place]) Active Problems:   Paroxysmal atrial fibrillation ([place])   Pressure injury of skin   Subjective:   Fevers improved, CT done chest abd pelvis  ROS  Eleven systems are reviewed and negative except per hpi  Medications:  Antibiotics Given (last 72 hours)    Date/Time Action Medication Dose Rate   09/24/18 1200 New Bag/Given   vancomycin (VANCOCIN) IVPB 1000 mg/200 mL premix 1,000 mg 200 mL/hr   09/24/18 1722 New Bag/Given   ceFEPIme (MAXIPIME) 1 g in sodium chloride 0.9 % 100 mL IVPB 1 g 200 mL/hr   09/26/18 1829 New Bag/Given   vancomycin (VANCOCIN) IVPB 1000 mg/200 mL premix 1,000 mg 200 mL/hr     . sodium chloride   Intravenous Once  . amiodarone  400 mg Oral BID  . aspirin EC  81 mg Oral Daily  . Chlorhexidine Gluconate Cloth  6 each Topical Q0600  . cinacalcet  60 mg Oral Once per day on Mon Wed Fri  . ciprofloxacin-dexamethasone  4 drop Right EAR BID  . epoetin (EPOGEN/PROCRIT) injection  10,000 Units Intravenous Q M,W,F-HD  . feeding supplement (NEPRO CARB STEADY)  237 mL Oral BID BM  . feeding supplement (PRO-STAT SUGAR FREE 64)  30 mL Oral Daily  . ferrous sulfate  325 mg Oral TID WC  . Gerhardt's butt cream   Topical QID  . glipiZIDE  10 mg Oral Q breakfast  . insulin aspart  0-15 Units Subcutaneous TID WC  . insulin aspart  0-5 Units Subcutaneous QHS  . metoprolol tartrate  12.5 mg Oral BID  . midodrine  5 mg Oral TID WC  . multivitamin  1 tablet Oral QHS  . pantoprazole  40 mg Oral Daily  . polyethylene glycol  17 g Oral BID  . senna-docusate  1 tablet Oral BID  . sevelamer carbonate  2,400 mg Oral TID WC  . sodium chloride flush  3 mL Intravenous Q12H  . sodium polystyrene  30 g Oral Once    Objective: Vital signs in last 24 hours: Temp:  [97.8 F  (36.6 C)-98.9 F (37.2 C)] 98.1 F (36.7 C) (12/14 0408) Pulse Rate:  [41-154] 65 (12/14 0408) Resp:  [11-22] 20 (12/14 0408) BP: (81-157)/(48-142) 105/57 (12/14 0408) SpO2:  [94 %-97 %] 96 % (12/14 0408) Weight:  [116.6 kg] 116.6 kg (12/14 0408) Constitutional: obese, lying in bed, alert HENT: anicteric, R ear with some moist fluid in ear canal but TM appears intact without redness or fluid Mouth/Throat: Oropharynx is clear and moist. No oropharyngeal exudate.  Cardiovascular: tachy irregular Pulmonary/Chest: Effort normal and breath sounds normal. No respiratory distress. He has no wheezes.  Abdominal: obese Soft. Bowel sounds are normal. He exhibits no distension. There is no tenderness.  Lymphadenopathy: He has no cervical adenopathy.  Neurological: He is alert and oriented to person, place, and time.  Skin: Skin is warm and dry. No rash noted. No erythema.  R BKA site has very well approximated incision site with no drainage, redness or tenderness L BKA site healed over Psychiatric: He has a normal mood and affect. His behavior is normal.  AVF LUE wnl, non tender   Lab Results Recent Labs    09/26/18 0327 09/27/18 0552  WBC 15.2* 13.4*  HGB 7.3* 7.1*  HCT 25.1* 24.3*  NA 133* 131*  K 5.7* 5.2*  CL 94* 91*  CO2 27 28  BUN 78* 55*  CREATININE 7.02* 5.56*    Microbiology: Results for orders placed or performed during the hospital encounter of 09/21/18  Blood Culture (routine x 2)     Status: None   Collection Time: 09/21/18  3:00 PM  Result Value Ref Range Status   Specimen Description BLOOD R AC  Final   Special Requests   Final    BOTTLES DRAWN AEROBIC AND ANAEROBIC Blood Culture results may not be optimal due to an excessive volume of blood received in culture bottles   Culture   Final    NO GROWTH 5 DAYS Performed at [facility], [address] [place], [place] [address]    Report Status 09/26/2018 FINAL  Final  Blood Culture (routine x 2)      Status: None   Collection Time: 09/21/18  3:00 PM  Result Value Ref Range Status   Specimen Description BLOOD R HAND  Final   Special Requests   Final    BOTTLES DRAWN AEROBIC AND ANAEROBIC Blood Culture adequate volume   Culture   Final    NO GROWTH 5 DAYS Performed at [facility], [address] [place], [place] [address]    Report Status 09/26/2018 FINAL  Final  MRSA PCR Screening     Status: None   Collection Time: 09/21/18 11:36 PM  Result Value Ref Range Status   MRSA by PCR NEGATIVE NEGATIVE Final    Comment:        The GeneXpert MRSA Assay (FDA approved for NASAL specimens only), is one component of a comprehensive MRSA colonization surveillance program. It is not intended to diagnose MRSA infection nor to guide or monitor treatment for MRSA infections. Performed at [facility], [address] [place], [place] [address]     Studies/Results: Ct Chest W Contrast  Result Date: 09/26/2018 CLINICAL DATA:  Sepsis and fever. Chronic anemia. End-stage renal disease on dialysis. EXAM: CT CHEST, ABDOMEN, AND PELVIS WITH CONTRAST TECHNIQUE: Multidetector CT imaging of the chest, abdomen and pelvis was performed following the standard protocol during bolus administration of intravenous contrast. CONTRAST:  127mL ISOVUE-300 IOPAMIDOL (ISOVUE-300) INJECTION 61% COMPARISON:  CT abdomen from 05/31/2009 FINDINGS: CT CHEST FINDINGS Cardiovascular: Coronary, aortic arch, and branch vessel atherosclerotic vascular disease. Calcifications of the mitral and aortic valves. Moderate to large pericardial effusion. Moderate cardiomegaly. Mediastinum/Nodes: Scattered small mediastinal lymph nodes, not pathologically enlarged. Lungs/Pleura: Airway thickening is present, suggesting bronchitis or reactive airways disease. Mild atelectasis in the lingula. 4 mm in diameter calcified granuloma in the posterior basal segment left lower lobe on image 104/4. Musculoskeletal: Asymmetric  gynecomastia, notably more striking on the left than the right. 2.9 by 1.1 cm sclerotic lesion laterally in the left sixth rib, previously slightly more lucent centrally back on 05/31/2009 but otherwise similar in size. Thoracic spondylosis and diffuse mild thoracic sclerosis likely from renal osteodystrophy. Likely chronic mild superior endplate compression fracture at T9. CT ABDOMEN PELVIS FINDINGS Hepatobiliary: A rim enhancing lesion in the left hepatic lobe measures 2.2 by 1.6 cm. There is a more diffusely enhancing lesion in this vicinity on 05/31/2009 measuring 1.6 by 1.3 cm. Gallbladder wall thickening is present along with mild contraction of the gallbladder. Pancreas: Unremarkable Spleen: Unremarkable Adrenals/Urinary Tract: Numerous cysts are present in both kidneys and demonstrate a variable degree of complexity fullness of both adrenal glands without discrete mass. No  urinary tract calculi observed. Stomach/Bowel: Mild prominence of stool in the colon and rectum. Vascular/Lymphatic: Aortoiliac atherosclerotic vascular disease. Left external iliac node 1.2 cm in short axis on image 116/2, formerly 0.9 cm. Reproductive: Unremarkable Other: Asymmetric subcutaneous edema in the right upper thigh. Musculoskeletal: Diffuse bony sclerosis. Fused sacroiliac joints. Age-indeterminate per probably late subacute superior endplate compression fracture at L2 with 25% loss of height. IMPRESSION: 1. Moderate to large pericardial effusion, not present on 05/31/2009. Correlate with any jugular venous distention or signs of Beck's triad in assessing for tamponade. 2. There is subcutaneous edema in the right lateral thigh which appears asymmetrically increased compared to the left, but is otherwise nonspecific. There is also a mildly enlarged right external iliac lymph node. Strictly speaking an infectious process such as cellulitis is not excluded, correlate with physical exam findings in determining need for further  workup. 3. 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 Some of the renal cystic lesions are complex but otherwise technically nonspecific. 4. Renal osteodystrophy. 5. Late subacute superior endplate compression fracture at L2. 6. Aortic Atherosclerosis (ICD10-I70.0). Coronary atherosclerosis with mitral and aortic valve calcification as well as cardiomegaly. 7. Airway thickening is present, suggesting bronchitis or reactive airways disease. 8. Asymmetric gynecomastia, striking on the left side. 9. Rim enhancing lesion in the left hepatic lobe has mildly enlarged compared to the previous diffusely enhancing lesion shown on 05/31/2009. The mild change over an intervening 9 year period favors a benign etiology such as hemangioma, although lesion is technically nonspecific. 10.  Prominent stool throughout the colon favors constipation. The top 2 impressions, which are likely the most relevant for the patient's immediate clinical care, will be called to the ordering clinician or representative by the Radiologist Assistant, and communication documented in the PACS or zVision Dashboard. Electronically Signed   By: Van Clines M.D.   On: 09/26/2018 19:18   Ct Abdomen Pelvis W Contrast  Result Date: 09/26/2018 CLINICAL DATA:  Sepsis and fever. Chronic anemia. End-stage renal disease on dialysis. EXAM: CT CHEST, ABDOMEN, AND PELVIS WITH CONTRAST TECHNIQUE: Multidetector CT imaging of the chest, abdomen and pelvis was performed following the standard protocol during bolus administration of intravenous contrast. CONTRAST:  137mL ISOVUE-300 IOPAMIDOL (ISOVUE-300) INJECTION 61% COMPARISON:  CT abdomen from 05/31/2009 FINDINGS: CT CHEST FINDINGS Cardiovascular: Coronary, aortic arch, and branch vessel atherosclerotic vascular disease. Calcifications of the mitral and aortic valves. Moderate to large pericardial effusion. Moderate cardiomegaly. Mediastinum/Nodes: Scattered small mediastinal lymph nodes, not  pathologically enlarged. Lungs/Pleura: Airway thickening is present, suggesting bronchitis or reactive airways disease. Mild atelectasis in the lingula. 4 mm in diameter calcified granuloma in the posterior basal segment left lower lobe on image 104/4. Musculoskeletal: Asymmetric gynecomastia, notably more striking on the left than the right. 2.9 by 1.1 cm sclerotic lesion laterally in the left sixth rib, previously slightly more lucent centrally back on 05/31/2009 but otherwise similar in size. Thoracic spondylosis and diffuse mild thoracic sclerosis likely from renal osteodystrophy. Likely chronic mild superior endplate compression fracture at T9. CT ABDOMEN PELVIS FINDINGS Hepatobiliary: A rim enhancing lesion in the left hepatic lobe measures 2.2 by 1.6 cm. There is a more diffusely enhancing lesion in this vicinity on 05/31/2009 measuring 1.6 by 1.3 cm. Gallbladder wall thickening is present along with mild contraction of the gallbladder. Pancreas: Unremarkable Spleen: Unremarkable Adrenals/Urinary Tract: Numerous cysts are present in both kidneys and demonstrate a variable degree of complexity fullness of both adrenal glands without discrete mass. No urinary tract calculi observed. Stomach/Bowel: Mild  prominence of stool in the colon and rectum. Vascular/Lymphatic: Aortoiliac atherosclerotic vascular disease. Left external iliac node 1.2 cm in short axis on image 116/2, formerly 0.9 cm. Reproductive: Unremarkable Other: Asymmetric subcutaneous edema in the right upper thigh. Musculoskeletal: Diffuse bony sclerosis. Fused sacroiliac joints. Age-indeterminate per probably late subacute superior endplate compression fracture at L2 with 25% loss of height. IMPRESSION: 1. Moderate to large pericardial effusion, not present on 05/31/2009. Correlate with any jugular venous distention or signs of Beck's triad in assessing for tamponade. 2. There is subcutaneous edema in the right lateral thigh which appears  asymmetrically increased compared to the left, but is otherwise nonspecific. There is also a mildly enlarged right external iliac lymph node. Strictly speaking an infectious process such as cellulitis is not excluded, correlate with physical exam findings in determining need for further workup. 3. 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 Some of the renal cystic lesions are complex but otherwise technically nonspecific. 4. Renal osteodystrophy. 5. Late subacute superior endplate compression fracture at L2. 6. Aortic Atherosclerosis (ICD10-I70.0). Coronary atherosclerosis with mitral and aortic valve calcification as well as cardiomegaly. 7. Airway thickening is present, suggesting bronchitis or reactive airways disease. 8. Asymmetric gynecomastia, striking on the left side. 9. Rim enhancing lesion in the left hepatic lobe has mildly enlarged compared to the previous diffusely enhancing lesion shown on 05/31/2009. The mild change over an intervening 9 year period favors a benign etiology such as hemangioma, although lesion is technically nonspecific. 10.  Prominent stool throughout the colon favors constipation. The top 2 impressions, which are likely the most relevant for the patient's immediate clinical care, will be called to the ordering clinician or representative by the Radiologist Assistant, and communication documented in the PACS or zVision Dashboard. Electronically Signed   By: Van Clines M.D.   On: 09/26/2018 19:18    Assessment/Plan: [name] is a 57 y.o. male with ESRD, PAD, recent R BKA now admitted from NH with fevers, chills, hypotension, wbc 28, some diarrhea, CP and rapid A fib.  His R BKA stump site appears very clean and intact.  His AVF site LUE also appears normal. Flu negative. CT head neg and CXR neg for infection.  I think bacteremia from AVF site is most likely etiology, and cultures are pending. No evidence of meningitis despite some "shaking" which I suspect  were rigors.  GI source also possible since some diarrhea.  12/10- febrile again but had blood at the same time. Otherwise feels a little better 12/11 - no further fevers. Cultures negative. 12/12 still afebrile but complaining of CP both before and after HD. Relieved by nitro. Some R stump pain since hit his leg in HD when transferring.  Still co weakness and shaking in hands 12/14 - no further fevers. Narrowed to just vancomycin.  CT reviewed- pericardial effusion which may be causing some of the CP and also diff with fluid removal.    Recommendations Still unclear source of initial fever but has improved. Cxs all negative. CT shows large pleural effusion which may be cause of his Chest pain and some edema in R lateral thigh (where recent BKA done) Would discuss effusion with cardiology and renal  Cont  vanco at HD empirically at this point for a total 14 day course given some concern for HD access infection although site looks good and neg bcx - stop date for vanco would be 12/22  Remains off cefepime given no evidence GN infection and having myoclonus.  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  Improving clinically.   Thank you very much for the consult. Will follow with you.  Leonel Ramsay   09/27/2018, 9:18 AM

## 2018-09-28 LAB — TYPE AND SCREEN
ABO/RH(D): O POS
Antibody Screen: NEGATIVE
Unit division: 0

## 2018-09-28 LAB — BPAM RBC
Blood Product Expiration Date: 202001112359
ISSUE DATE / TIME: 201912141552
Unit Type and Rh: 5100

## 2018-09-28 LAB — CBC
HCT: 26.8 % — ABNORMAL LOW (ref 39.0–52.0)
Hemoglobin: 7.9 g/dL — ABNORMAL LOW (ref 13.0–17.0)
MCH: 28.9 pg (ref 26.0–34.0)
MCHC: 29.5 g/dL — ABNORMAL LOW (ref 30.0–36.0)
MCV: 98.2 fL (ref 80.0–100.0)
Platelets: 440 10*3/uL — ABNORMAL HIGH (ref 150–400)
RBC: 2.73 MIL/uL — ABNORMAL LOW (ref 4.22–5.81)
RDW: 17.2 % — ABNORMAL HIGH (ref 11.5–15.5)
WBC: 14.2 10*3/uL — ABNORMAL HIGH (ref 4.0–10.5)
nRBC: 0.5 % — ABNORMAL HIGH (ref 0.0–0.2)

## 2018-09-28 LAB — BASIC METABOLIC PANEL
Anion gap: 11 (ref 5–15)
BUN: 42 mg/dL — ABNORMAL HIGH (ref 6–20)
CHLORIDE: 95 mmol/L — AB (ref 98–111)
CO2: 29 mmol/L (ref 22–32)
CREATININE: 4.23 mg/dL — AB (ref 0.61–1.24)
Calcium: 9.1 mg/dL (ref 8.9–10.3)
GFR calc Af Amer: 17 mL/min — ABNORMAL LOW (ref >60)
GFR calc non Af Amer: 14 mL/min — ABNORMAL LOW (ref >60)
Glucose, Bld: 219 mg/dL — ABNORMAL HIGH (ref 70–99)
Potassium: 4.5 mmol/L (ref 3.5–5.1)
SODIUM: 135 mmol/L (ref 135–145)

## 2018-09-28 LAB — GLUCOSE, CAPILLARY: Glucose-Capillary: 159 mg/dL — ABNORMAL HIGH (ref 70–99)

## 2018-09-28 MED ORDER — OXYCODONE HCL 5 MG PO TABS: 5.0000 mg | ORAL_TABLET | Freq: Four times a day (QID) | ORAL | 0 refills | Status: DC | PRN

## 2018-09-28 MED ORDER — VANCOMYCIN HCL IN DEXTROSE 1-5 GM/200ML-% IV SOLN: 1000.0000 mg | INTRAVENOUS | 0 refills | Status: AC

## 2018-09-28 MED ORDER — MIDODRINE HCL 5 MG PO TABS
10.0000 mg | ORAL_TABLET | Freq: Three times a day (TID) | ORAL | Status: DC
  Administered 2018-09-28: 10 mg via ORAL
  Filled 2018-09-28 (×3): qty 2

## 2018-09-28 MED ORDER — NEPRO/CARBSTEADY PO LIQD: 237.0000 mL | Freq: Two times a day (BID) | ORAL | 0 refills | Status: DC

## 2018-09-28 MED ORDER — AMIODARONE HCL 400 MG PO TABS: 400.0000 mg | ORAL_TABLET | Freq: Two times a day (BID) | ORAL | 0 refills | Status: DC

## 2018-09-28 MED ORDER — GERHARDT'S BUTT CREAM: 1.0000 "application " | TOPICAL_CREAM | Freq: Four times a day (QID) | CUTANEOUS | Status: DC

## 2018-09-28 MED ORDER — MIDODRINE HCL 10 MG PO TABS: 10.0000 mg | ORAL_TABLET | Freq: Three times a day (TID) | ORAL | 0 refills | Status: AC

## 2018-09-28 MED ORDER — CIPROFLOXACIN-DEXAMETHASONE 0.3-0.1 % OT SUSP: 4.0000 [drp] | Freq: Two times a day (BID) | OTIC | 0 refills | Status: AC

## 2018-09-28 NOTE — Progress Notes (Signed)
[facility]  ROUNDING NOTE   Subjective:   Extra hemodialysis treatment yesterday. Tolerated treatment well. UF of 2.5 liters.   1 unit PRBC transfusion with hemodialysis treatment.   History taken with assistance of Spanish Interpreter  Objective:  Vital signs in last 24 hours:  Temp:  [97.2 F (36.2 C)-99 F (37.2 C)] 99 F (37.2 C) (12/15 1145) Pulse Rate:  [47-129] 61 (12/15 1145) Resp:  [12-22] 16 (12/15 1145) BP: (82-128)/(30-91) 111/57 (12/15 1145) SpO2:  [89 %-99 %] 89 % (12/15 1145) Weight:  [113.1 kg] 113.1 kg (12/14 1757)  Weight change: -3.475 kg Filed Weights   09/24/18 1314 09/27/18 0408 09/27/18 1757  Weight: 115.1 kg 116.6 kg 113.1 kg    Intake/Output: I/O last 3 completed shifts: In: 439.2 [Blood:300; IV Piggyback:139.2] Out: 2500 [Other:2500]   Intake/Output this shift:  No intake/output data recorded.  Physical Exam: General: NAD,   Head: Normocephalic, atraumatic. Moist oral mucosal membranes  Eyes: Anicteric, PERRL  Neck: Supple, trachea midline  Lungs:  Clear to auscultation  Heart: irregular  Abdomen:  Soft, nontender, obese  Extremities:  bilateral amputations, no edema  Neurologic: Nonfocal, moving all four extremities  Skin: Stage 1 decubitus ulcer  Access: Left AVF    Basic Metabolic Panel: Recent Labs  Lab 09/22/18 0838  09/24/18 0512 09/25/18 0523 09/26/18 0327 09/27/18 0552 09/28/18 0329  NA 132*   < > 134* 135 133* 131* 135  K 5.9*   < > 5.1 4.9 5.7* 5.2* 4.5  CL 95*   < > 94* 95* 94* 91* 95*  CO2 25   < > 27 28 27 28 29   GLUCOSE 158*   < > 148* 208* 235* 186* 219*  BUN 78*   < > 73* 57* 78* 55* 42*  CREATININE 8.93*   < > 7.96* 5.69* 7.02* 5.56* 4.23*  CALCIUM 8.2*   < > 9.2 9.1 9.7 9.5 9.1  PHOS 4.2  --   --   --   --   --   --    < > = values in this interval not displayed.    Liver Function Tests: Recent Labs  Lab 09/22/18 0838  ALBUMIN 2.8*   No results for input(s): LIPASE, AMYLASE in the  last 168 hours. No results for input(s): AMMONIA in the last 168 hours.  CBC: Recent Labs  Lab 09/24/18 1020 09/24/18 1518 09/25/18 0523 09/26/18 0327 09/27/18 0552 09/28/18 0329  WBC 15.6*  --  13.5* 15.2* 13.4* 14.2*  HGB 7.0* 7.9* 7.2* 7.3* 7.1* 7.9*  HCT 23.8* 26.0* 24.4* 25.1* 24.3* 26.8*  MCV 100.0  --  97.6 99.6 99.2 98.2  PLT 429*  --  430* 440* 403* 440*    Cardiac Enzymes: Recent Labs  Lab 09/22/18 2215 09/23/18 0318 09/23/18 0836 09/23/18 1449 09/23/18 1952  TROPONINI 0.18* 0.25* 0.26* 0.20* 0.18*    BNP: Invalid input(s): POCBNP  CBG: Recent Labs  Lab 09/27/18 0743 09/27/18 1147 09/27/18 1850 09/27/18 2133 09/28/18 0759  GLUCAP 198* 236* 143* 195* 159*    Microbiology: Results for orders placed or performed during the hospital encounter of 09/21/18  Blood Culture (routine x 2)     Status: None   Collection Time: 09/21/18  3:00 PM  Result Value Ref Range Status   Specimen Description BLOOD R AC  Final   Special Requests   Final    BOTTLES DRAWN AEROBIC AND ANAEROBIC Blood Culture results may not be optimal due to an excessive volume  of blood received in culture bottles   Culture   Final    NO GROWTH 5 DAYS Performed at [facility], [address] [place], [place] [address]    Report Status 09/26/2018 FINAL  Final  Blood Culture (routine x 2)     Status: None   Collection Time: 09/21/18  3:00 PM  Result Value Ref Range Status   Specimen Description BLOOD R HAND  Final   Special Requests   Final    BOTTLES DRAWN AEROBIC AND ANAEROBIC Blood Culture adequate volume   Culture   Final    NO GROWTH 5 DAYS Performed at [facility], [address] [place], [place] [address]    Report Status 09/26/2018 FINAL  Final  MRSA PCR Screening     Status: None   Collection Time: 09/21/18 11:36 PM  Result Value Ref Range Status   MRSA by PCR NEGATIVE NEGATIVE Final    Comment:        The GeneXpert MRSA Assay (FDA approved for  NASAL specimens only), is one component of a comprehensive MRSA colonization surveillance program. It is not intended to diagnose MRSA infection nor to guide or monitor treatment for MRSA infections. Performed at [facility], [address] [place], [place] [address]     Coagulation Studies: No results for input(s): LABPROT, INR in the last 72 hours.  Urinalysis: No results for input(s): COLORURINE, LABSPEC, PHURINE, GLUCOSEU, HGBUR, BILIRUBINUR, KETONESUR, PROTEINUR, UROBILINOGEN, NITRITE, LEUKOCYTESUR in the last 72 hours.  Invalid input(s): APPERANCEUR    Imaging: Ct Chest W Contrast  Result Date: 09/26/2018 CLINICAL DATA:  Sepsis and fever. Chronic anemia. End-stage renal disease on dialysis. EXAM: CT CHEST, ABDOMEN, AND PELVIS WITH CONTRAST TECHNIQUE: Multidetector CT imaging of the chest, abdomen and pelvis was performed following the standard protocol during bolus administration of intravenous contrast. CONTRAST:  162mL ISOVUE-300 IOPAMIDOL (ISOVUE-300) INJECTION 61% COMPARISON:  CT abdomen from 05/31/2009 FINDINGS: CT CHEST FINDINGS Cardiovascular: Coronary, aortic arch, and branch vessel atherosclerotic vascular disease. Calcifications of the mitral and aortic valves. Moderate to large pericardial effusion. Moderate cardiomegaly. Mediastinum/Nodes: Scattered small mediastinal lymph nodes, not pathologically enlarged. Lungs/Pleura: Airway thickening is present, suggesting bronchitis or reactive airways disease. Mild atelectasis in the lingula. 4 mm in diameter calcified granuloma in the posterior basal segment left lower lobe on image 104/4. Musculoskeletal: Asymmetric gynecomastia, notably more striking on the left than the right. 2.9 by 1.1 cm sclerotic lesion laterally in the left sixth rib, previously slightly more lucent centrally back on 05/31/2009 but otherwise similar in size. Thoracic spondylosis and diffuse mild thoracic sclerosis likely from renal  osteodystrophy. Likely chronic mild superior endplate compression fracture at T9. CT ABDOMEN PELVIS FINDINGS Hepatobiliary: A rim enhancing lesion in the left hepatic lobe measures 2.2 by 1.6 cm. There is a more diffusely enhancing lesion in this vicinity on 05/31/2009 measuring 1.6 by 1.3 cm. Gallbladder wall thickening is present along with mild contraction of the gallbladder. Pancreas: Unremarkable Spleen: Unremarkable Adrenals/Urinary Tract: Numerous cysts are present in both kidneys and demonstrate a variable degree of complexity fullness of both adrenal glands without discrete mass. No urinary tract calculi observed. Stomach/Bowel: Mild prominence of stool in the colon and rectum. Vascular/Lymphatic: Aortoiliac atherosclerotic vascular disease. Left external iliac node 1.2 cm in short axis on image 116/2, formerly 0.9 cm. Reproductive: Unremarkable Other: Asymmetric subcutaneous edema in the right upper thigh. Musculoskeletal: Diffuse bony sclerosis. Fused sacroiliac joints. Age-indeterminate per probably late subacute superior endplate compression fracture at L2  with 25% loss of height. IMPRESSION: 1. Moderate to large pericardial effusion, not present on 05/31/2009. Correlate with any jugular venous distention or signs of Beck's triad in assessing for tamponade. 2. There is subcutaneous edema in the right lateral thigh which appears asymmetrically increased compared to the left, but is otherwise nonspecific. There is also a mildly enlarged right external iliac lymph node. Strictly speaking an infectious process such as cellulitis is not excluded, correlate with physical exam findings in determining need for further workup. 3. 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 Some of the renal cystic lesions are complex but otherwise technically nonspecific. 4. Renal osteodystrophy. 5. Late subacute superior endplate compression fracture at L2. 6. Aortic Atherosclerosis (ICD10-I70.0). Coronary  atherosclerosis with mitral and aortic valve calcification as well as cardiomegaly. 7. Airway thickening is present, suggesting bronchitis or reactive airways disease. 8. Asymmetric gynecomastia, striking on the left side. 9. Rim enhancing lesion in the left hepatic lobe has mildly enlarged compared to the previous diffusely enhancing lesion shown on 05/31/2009. The mild change over an intervening 9 year period favors a benign etiology such as hemangioma, although lesion is technically nonspecific. 10.  Prominent stool throughout the colon favors constipation. The top 2 impressions, which are likely the most relevant for the patient's immediate clinical care, will be called to the ordering clinician or representative by the Radiologist Assistant, and communication documented in the PACS or zVision Dashboard. Electronically Signed   By: Van Clines M.D.   On: 09/26/2018 19:18   Ct Abdomen Pelvis W Contrast  Result Date: 09/26/2018 CLINICAL DATA:  Sepsis and fever. Chronic anemia. End-stage renal disease on dialysis. EXAM: CT CHEST, ABDOMEN, AND PELVIS WITH CONTRAST TECHNIQUE: Multidetector CT imaging of the chest, abdomen and pelvis was performed following the standard protocol during bolus administration of intravenous contrast. CONTRAST:  170mL ISOVUE-300 IOPAMIDOL (ISOVUE-300) INJECTION 61% COMPARISON:  CT abdomen from 05/31/2009 FINDINGS: CT CHEST FINDINGS Cardiovascular: Coronary, aortic arch, and branch vessel atherosclerotic vascular disease. Calcifications of the mitral and aortic valves. Moderate to large pericardial effusion. Moderate cardiomegaly. Mediastinum/Nodes: Scattered small mediastinal lymph nodes, not pathologically enlarged. Lungs/Pleura: Airway thickening is present, suggesting bronchitis or reactive airways disease. Mild atelectasis in the lingula. 4 mm in diameter calcified granuloma in the posterior basal segment left lower lobe on image 104/4. Musculoskeletal: Asymmetric  gynecomastia, notably more striking on the left than the right. 2.9 by 1.1 cm sclerotic lesion laterally in the left sixth rib, previously slightly more lucent centrally back on 05/31/2009 but otherwise similar in size. Thoracic spondylosis and diffuse mild thoracic sclerosis likely from renal osteodystrophy. Likely chronic mild superior endplate compression fracture at T9. CT ABDOMEN PELVIS FINDINGS Hepatobiliary: A rim enhancing lesion in the left hepatic lobe measures 2.2 by 1.6 cm. There is a more diffusely enhancing lesion in this vicinity on 05/31/2009 measuring 1.6 by 1.3 cm. Gallbladder wall thickening is present along with mild contraction of the gallbladder. Pancreas: Unremarkable Spleen: Unremarkable Adrenals/Urinary Tract: Numerous cysts are present in both kidneys and demonstrate a variable degree of complexity fullness of both adrenal glands without discrete mass. No urinary tract calculi observed. Stomach/Bowel: Mild prominence of stool in the colon and rectum. Vascular/Lymphatic: Aortoiliac atherosclerotic vascular disease. Left external iliac node 1.2 cm in short axis on image 116/2, formerly 0.9 cm. Reproductive: Unremarkable Other: Asymmetric subcutaneous edema in the right upper thigh. Musculoskeletal: Diffuse bony sclerosis. Fused sacroiliac joints. Age-indeterminate per probably late subacute superior endplate compression fracture at L2 with 25% loss of height. IMPRESSION:  1. Moderate to large pericardial effusion, not present on 05/31/2009. Correlate with any jugular venous distention or signs of Beck's triad in assessing for tamponade. 2. There is subcutaneous edema in the right lateral thigh which appears asymmetrically increased compared to the left, but is otherwise nonspecific. There is also a mildly enlarged right external iliac lymph node. Strictly speaking an infectious process such as cellulitis is not excluded, correlate with physical exam findings in determining need for further  workup. 3. 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 Some of the renal cystic lesions are complex but otherwise technically nonspecific. 4. Renal osteodystrophy. 5. Late subacute superior endplate compression fracture at L2. 6. Aortic Atherosclerosis (ICD10-I70.0). Coronary atherosclerosis with mitral and aortic valve calcification as well as cardiomegaly. 7. Airway thickening is present, suggesting bronchitis or reactive airways disease. 8. Asymmetric gynecomastia, striking on the left side. 9. Rim enhancing lesion in the left hepatic lobe has mildly enlarged compared to the previous diffusely enhancing lesion shown on 05/31/2009. The mild change over an intervening 9 year period favors a benign etiology such as hemangioma, although lesion is technically nonspecific. 10.  Prominent stool throughout the colon favors constipation. The top 2 impressions, which are likely the most relevant for the patient's immediate clinical care, will be called to the ordering clinician or representative by the Radiologist Assistant, and communication documented in the PACS or zVision Dashboard. Electronically Signed   By: Van Clines M.D.   On: 09/26/2018 19:18     Medications:   . sodium chloride Stopped (09/24/18 1955)  . vancomycin Stopped (09/26/18 1947)   . sodium chloride   Intravenous Once  . sodium chloride   Intravenous Once  . amiodarone  400 mg Oral BID  . aspirin EC  81 mg Oral Daily  . cinacalcet  60 mg Oral Once per day on Mon Wed Fri  . ciprofloxacin-dexamethasone  4 drop Right EAR BID  . epoetin (EPOGEN/PROCRIT) injection  10,000 Units Intravenous Q M,W,F-HD  . feeding supplement (NEPRO CARB STEADY)  237 mL Oral BID BM  . feeding supplement (PRO-STAT SUGAR FREE 64)  30 mL Oral Daily  . ferrous sulfate  325 mg Oral TID WC  . Gerhardt's butt cream   Topical QID  . glipiZIDE  10 mg Oral Q breakfast  . insulin aspart  0-15 Units Subcutaneous TID WC  . insulin aspart  0-5 Units  Subcutaneous QHS  . midodrine  10 mg Oral TID WC  . multivitamin  1 tablet Oral QHS  . pantoprazole  40 mg Oral Daily  . polyethylene glycol  17 g Oral BID  . senna-docusate  1 tablet Oral BID  . sevelamer carbonate  2,400 mg Oral TID WC  . sodium chloride flush  3 mL Intravenous Q12H  . sodium polystyrene  30 g Oral Once   sodium chloride, acetaminophen, hydrALAZINE, lidocaine-prilocaine, metoCLOPramide (REGLAN) injection, metoprolol tartrate, midodrine, ondansetron **OR** ondansetron (ZOFRAN) IV, oxyCODONE, sodium chloride flush  Assessment/ Plan:  [name] is a 57 y.o. Hispanic (Spanish speaking only) malewith end stage renal disease, peripheral vascular disease, hypertension, CVA, hyperlipidemia, diabetes mellitus type II who is admitted to [facility] 106.5 kg Left AVF  1. End Stage Renal Disease: Hemodialysis treatment for tomorrow. Continue MWF schedule.      2. Anemia of chronic kidney disease: status post PRBC transfusions. - EPO with HD treatment  3. Hypotension:  - Midodrine     4. Secondary Hyperparathyroidism:  -  continue Renvela and Sensipar with meals.  - Cinacalcet three times weekly.   5. Ear pain: empiric ciprofloxacin/dexamethasone otic solution.   6. Sepsis:  Appreciate ID input. Outpatient cultures no growth.  - Vancomycin - completion on 12/22.   7. Jerking - off gabapentin - Appreciate neurology   LOS: 7 [name] 12/15/20191:24 PM

## 2018-09-28 NOTE — Discharge Summary (Signed)
[facility] NAME: [name]    MR#:  [id number]  DATE OF BIRTH:  April 23, 1961  DATE OF ADMISSION:  09/21/2018 ADMITTING PHYSICIAN: Gorden Harms, MD  DATE OF DISCHARGE: 09/28/2018  PRIMARY CARE PHYSICIAN: [name], MD    ADMISSION DIAGNOSIS:  Sepsis, due to unspecified organism, unspecified whether acute organ dysfunction present ([place]) [A41.9]  DISCHARGE DIAGNOSIS:  Principal Problem:   Sepsis ([place]) Active Problems:   Paroxysmal atrial fibrillation ([place])   Pressure injury of skin   SECONDARY DIAGNOSIS:   Past Medical History:  Diagnosis Date  . Anemia of chronic disease   . Atherosclerosis   . Atrial fibrillation with rapid ventricular response (HCC)    chadsvasc at least 6; new onset versus paroxysmal with first or most recent episode on 09/10/18  . CAD (coronary artery disease)    2017 LHC, severe 1v disease, occluded pRCA, L-R collaterals, moderate LV dysfunction EF 30-35  . Diabetes mellitus with complication ([place])   . End stage renal disease on dialysis ([place])    Labile BP  . History of ventricular tachycardia    2017 polymorphic VT and cardiac arrest s/p angioplasty   . Hypercholesteremia   . Hyperlipidemia LDL goal <70   . Hypertension   . Labile blood pressure    on HD  . NSVT (nonsustained ventricular tachycardia) ([place])    08/2018 NSVT  . Peripheral arterial occlusive disease (HCC)    s/p L BKA, R AKA  . S/P AKA (above knee amputation) unilateral, right ([place])    08/2018  . S/P BKA (below knee amputation) unilateral, left ([place])    2015  . Stroke ([place])   . Systolic heart failure ([place])    2017 EF per LHC 30-35%    HOSPITAL COURSE:   58 year old male with end-stage renal disease on hemodialysis, peripheral vascular disease with recent AKA, diabetes  who presented to the ER due to fever.  1.  Sepsis: Patient presented with fever and leukocytosis. Sepsis still from unclear  etiology. Right ear did have evidence of otitis media which she is receiving eardrops for.  He was evaluated by infectious disease.  He was initially started on broad-spectrum antibiotics.  Recommendations by Dr. Ola Spurr is 2 weeks of IV vancomycin empirically. He has 7 more days of vancomycin.  This is renally dosing to be given during dialysis. He was also evaluated by vascular surgery.  There is no evidence of sepsis/infection of the recent AKA. All blood cultures have been negative. He will continue Cipro OTIC eardrops for otitis media and vancomycin empirically for total 14-day course given some concern for HD access infection although the site looks good him cultures have been negative as per ID.  Stop date for vancomycin is December 22.   2.  Acute on chronic anemia: Patient is status post 3 units PRBC.  Recommendations by cardiology is to stop Eliquis.    Continue ferrous sulfate 3.  History of hypotension: Continue midodrine before dialysis treatments  4.  Peripheral artery disease status post recent right above-knee amputation: Vascular surgery consultation appreciated.  Stump looks clean and intact and is not source of sepsis.   5.  Diabetes: She will continue with ADA diet and outpatient regimen  6.  PAF: She is currently in normal sinus rhythm.  He was evaluated by cardiology.  Recommendations due to an episode of atrial fibrillation with RVR are to taper amiodarone.  He is on amiodarone 40 mg  p.o. twice daily for 2 more days then he will have 200 mg p.o. twice daily of amiodarone for 7 days then 200 mg daily.  Within that timeframe he should follow-up with Dr. Fletcher Anon.  7.  End-stage renal disease on hemodialysis: Continue dialysis as per schedule by nephrology  8.  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   CT scan showed possible  pericardial effusion.  He had a repeat echo which did show minimal pericardial effusion.  9.  Chest pain: This pain is due to atrial fibrillation and anemia.  Patient ruled out for ACS. Cardiology consultation appreciated.   10.  Tremor: EEG was negative for seizure activity.  Gabapentin has been discontinued by neurology.   He has had no further tremors.  He will follow-up with neurology as outpatient.   11.  Otitis media: Continue Cipro drops until 10/01/2018   12.  Pericardial effusion seen on CT scan: This does not appear to be causing any hemodynamic compromise.  It appears small to moderate by echo as per cardiology.   DISCHARGE CONDITIONS AND DIET:   Stable for discharge on renal/diabetic heart healthy diet  CONSULTS OBTAINED:  Treatment Team:  Lavonia Dana, MD End, Harrell Gave, MD Catarina Hartshorn, MD Clapacs, Madie Reno, MD  DRUG ALLERGIES:  No Known Allergies  DISCHARGE MEDICATIONS:   Allergies as of 09/28/2018   No Known Allergies     Medication List    STOP taking these medications   apixaban 5 MG Tabs tablet Commonly known as:  ELIQUIS   gabapentin 300 MG capsule Commonly known as:  NEURONTIN   metoprolol tartrate 25 MG tablet Commonly known as:  LOPRESSOR     TAKE these medications   acetaminophen 325 MG tablet Commonly known as:  TYLENOL Take 650 mg by mouth every 6 (six) hours as needed.   amiodarone 400 MG tablet Commonly known as:  PACERONE Take 1 tablet (400 mg total) by mouth 2 (two) times daily. Take 400 mg by mouth twice a day until September 30, 2018 then take 200 mg twice a day for 1 week On December 25 changed to 200 mg daily What changed:    medication strength  how much to take  when to take this  additional instructions   aspirin EC 81 MG tablet Take 81 mg by mouth daily.   b complex-vitamin c-folic acid 0.8 MG Tabs tablet Take 1 tablet by mouth daily.   cinacalcet 60 MG tablet Commonly known as:  SENSIPAR Take 60  mg by mouth daily.   ciprofloxacin-dexamethasone OTIC suspension Commonly known as:  CIPRODEX Place 4 drops into the right ear 2 (two) times daily for 3 days.   feeding supplement (NEPRO CARB STEADY) Liqd Take 237 mLs by mouth 2 (two) times daily between meals.   feeding supplement (PRO-STAT SUGAR FREE 64) Liqd Take 30 mLs by mouth daily.   ferrous sulfate 325 (65 FE) MG EC tablet Take 325 mg by mouth 3 (three) times daily with meals.   Gerhardt's butt cream Crea Apply 1 application topically 4 (four) times daily.   glipiZIDE 10 MG tablet Commonly known as:  GLUCOTROL Take 10 mg by mouth daily.   lidocaine-prilocaine cream Commonly known as:  EMLA Apply 1 application topically as needed (port access).   midodrine 10 MG tablet Commonly known as:  PROAMATINE Take 1 tablet (10 mg total) by mouth 3 (three) times daily with  meals. What changed:    medication strength  how much to take   omeprazole 40 MG capsule Commonly known as:  PRILOSEC Take 40 mg by mouth 2 (two) times daily.   oxyCODONE 5 MG immediate release tablet Commonly known as:  Oxy IR/ROXICODONE Take 1 tablet (5 mg total) by mouth every 6 (six) hours as needed for moderate pain or severe pain.   polyethylene glycol packet Commonly known as:  MIRALAX / GLYCOLAX Take 17 g by mouth 2 (two) times daily.   senna-docusate 8.6-50 MG tablet Commonly known as:  Senokot-S Take 1 tablet by mouth 2 (two) times daily.   sevelamer carbonate 800 MG tablet Commonly known as:  RENVELA Take 2,400 mg by mouth 3 (three) times daily with meals.   vancomycin 1-5 GM/200ML-% Soln Commonly known as:  VANCOCIN Inject 200 mLs (1,000 mg total) into the vein every Monday, Wednesday, and Friday with hemodialysis for 7 days. Start taking on:  September 29, 2018            Durable Medical Equipment  (From admission, onward)         Start     Ordered   09/25/18 1215  For home use only DME Wheelchair electric  Once      09/25/18 1215            Today   CHIEF COMPLAINT:  Doing better this am denies SOB   VITAL SIGNS:  Blood pressure (!) 82/47, pulse (!) 57, temperature 98.2 F (36.8 C), temperature source Oral, resp. rate 15, height 5\' 10"  (1.778 m), weight 113.1 kg, SpO2 93 %.   REVIEW OF SYSTEMS:  Review of Systems  Constitutional: Negative.  Negative for chills, fever and malaise/fatigue.  HENT: Negative.  Negative for ear discharge, ear pain, hearing loss, nosebleeds and sore throat.   Eyes: Negative.  Negative for blurred vision and pain.  Respiratory: Negative.  Negative for cough, hemoptysis, shortness of breath and wheezing.   Cardiovascular: Negative.  Negative for chest pain, palpitations and leg swelling.  Gastrointestinal: Negative.  Negative for abdominal pain, blood in stool, diarrhea, nausea and vomiting.  Genitourinary: Negative.  Negative for dysuria.  Musculoskeletal: Negative.  Negative for back pain.  Skin: Negative.   Neurological: Negative for dizziness, tremors, speech change, focal weakness, seizures and headaches.  Endo/Heme/Allergies: Negative.  Does not bruise/bleed easily.  Psychiatric/Behavioral: Negative.  Negative for depression, hallucinations and suicidal ideas.     PHYSICAL EXAMINATION:  GENERAL:  56 y.o.-year-old patient lying in the bed with no acute distress.  NECK:  Supple, no jugular venous distention. No thyroid enlargement, no tenderness.  LUNGS: Normal breath sounds bilaterally, no wheezing, rales,rhonchi  No use of accessory muscles of respiration.  CARDIOVASCULAR: S1, S2 normal. No murmurs, rubs, or gallops.  ABDOMEN: Soft, non-tender, non-distended. Bowel sounds present. No organomegaly or mass.  EXTREMITIES: Right AKA left BKA PSYCHIATRIC: The patient is alert and oriented x 3.  SKIN: No obvious rash, lesion, or ulcer.   DATA REVIEW:   CBC Recent Labs  Lab 09/28/18 0329  WBC 14.2*  HGB 7.9*  HCT 26.8*  PLT 440*    Chemistries   Recent Labs  Lab 09/28/18 0329  NA 135  K 4.5  CL 95*  CO2 29  GLUCOSE 219*  BUN 42*  CREATININE 4.23*  CALCIUM 9.1    Cardiac Enzymes Recent Labs  Lab 09/23/18 0836 09/23/18 1449 09/23/18 1952  TROPONINI 0.26* 0.20* 0.18*    Microbiology Results  @[email or web address] @  RADIOLOGY:  Ct Chest W Contrast  Result Date: 09/26/2018 CLINICAL DATA:  Sepsis and fever. Chronic anemia. End-stage renal disease on dialysis. EXAM: CT CHEST, ABDOMEN, AND PELVIS WITH CONTRAST TECHNIQUE: Multidetector CT imaging of the chest, abdomen and pelvis was performed following the standard protocol during bolus administration of intravenous contrast. CONTRAST:  133mL ISOVUE-300 IOPAMIDOL (ISOVUE-300) INJECTION 61% COMPARISON:  CT abdomen from 05/31/2009 FINDINGS: CT CHEST FINDINGS Cardiovascular: Coronary, aortic arch, and branch vessel atherosclerotic vascular disease. Calcifications of the mitral and aortic valves. Moderate to large pericardial effusion. Moderate cardiomegaly. Mediastinum/Nodes: Scattered small mediastinal lymph nodes, not pathologically enlarged. Lungs/Pleura: Airway thickening is present, suggesting bronchitis or reactive airways disease. Mild atelectasis in the lingula. 4 mm in diameter calcified granuloma in the posterior basal segment left lower lobe on image 104/4. Musculoskeletal: Asymmetric gynecomastia, notably more striking on the left than the right. 2.9 by 1.1 cm sclerotic lesion laterally in the left sixth rib, previously slightly more lucent centrally back on 05/31/2009 but otherwise similar in size. Thoracic spondylosis and diffuse mild thoracic sclerosis likely from renal osteodystrophy. Likely chronic mild superior endplate compression fracture at T9. CT ABDOMEN PELVIS FINDINGS Hepatobiliary: A rim enhancing lesion in the left hepatic lobe measures 2.2 by 1.6 cm. There is a more diffusely enhancing lesion in this vicinity on 05/31/2009 measuring 1.6 by 1.3 cm. Gallbladder wall  thickening is present along with mild contraction of the gallbladder. Pancreas: Unremarkable Spleen: Unremarkable Adrenals/Urinary Tract: Numerous cysts are present in both kidneys and demonstrate a variable degree of complexity fullness of both adrenal glands without discrete mass. No urinary tract calculi observed. Stomach/Bowel: Mild prominence of stool in the colon and rectum. Vascular/Lymphatic: Aortoiliac atherosclerotic vascular disease. Left external iliac node 1.2 cm in short axis on image 116/2, formerly 0.9 cm. Reproductive: Unremarkable Other: Asymmetric subcutaneous edema in the right upper thigh. Musculoskeletal: Diffuse bony sclerosis. Fused sacroiliac joints. Age-indeterminate per probably late subacute superior endplate compression fracture at L2 with 25% loss of height. IMPRESSION: 1. Moderate to large pericardial effusion, not present on 05/31/2009. Correlate with any jugular venous distention or signs of Beck's triad in assessing for tamponade. 2. There is subcutaneous edema in the right lateral thigh which appears asymmetrically increased compared to the left, but is otherwise nonspecific. There is also a mildly enlarged right external iliac lymph node. Strictly speaking an infectious process such as cellulitis is not excluded, correlate with physical exam findings in determining need for further workup. 3. 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 Some of the renal cystic lesions are complex but otherwise technically nonspecific. 4. Renal osteodystrophy. 5. Late subacute superior endplate compression fracture at L2. 6. Aortic Atherosclerosis (ICD10-I70.0). Coronary atherosclerosis with mitral and aortic valve calcification as well as cardiomegaly. 7. Airway thickening is present, suggesting bronchitis or reactive airways disease. 8. Asymmetric gynecomastia, striking on the left side. 9. Rim enhancing lesion in the left hepatic lobe has mildly enlarged compared to the previous  diffusely enhancing lesion shown on 05/31/2009. The mild change over an intervening 9 year period favors a benign etiology such as hemangioma, although lesion is technically nonspecific. 10.  Prominent stool throughout the colon favors constipation. The top 2 impressions, which are likely the most relevant for the patient's immediate clinical care, will be called to the ordering clinician or representative by the Radiologist Assistant, and communication documented in the PACS or zVision Dashboard. Electronically Signed   By: Van Clines M.D.   On: 09/26/2018 19:18   Ct Abdomen Pelvis W Contrast  Result Date: 09/26/2018 CLINICAL DATA:  Sepsis and fever. Chronic anemia. End-stage renal disease on dialysis. EXAM: CT CHEST, ABDOMEN, AND PELVIS WITH CONTRAST TECHNIQUE: Multidetector CT imaging of the chest, abdomen and pelvis was performed following the standard protocol during bolus administration of intravenous contrast. CONTRAST:  148mL ISOVUE-300 IOPAMIDOL (ISOVUE-300) INJECTION 61% COMPARISON:  CT abdomen from 05/31/2009 FINDINGS: CT CHEST FINDINGS Cardiovascular: Coronary, aortic arch, and branch vessel atherosclerotic vascular disease. Calcifications of the mitral and aortic valves. Moderate to large pericardial effusion. Moderate cardiomegaly. Mediastinum/Nodes: Scattered small mediastinal lymph nodes, not pathologically enlarged. Lungs/Pleura: Airway thickening is present, suggesting bronchitis or reactive airways disease. Mild atelectasis in the lingula. 4 mm in diameter calcified granuloma in the posterior basal segment left lower lobe on image 104/4. Musculoskeletal: Asymmetric gynecomastia, notably more striking on the left than the right. 2.9 by 1.1 cm sclerotic lesion laterally in the left sixth rib, previously slightly more lucent centrally back on 05/31/2009 but otherwise similar in size. Thoracic spondylosis and diffuse mild thoracic sclerosis likely from renal osteodystrophy. Likely chronic  mild superior endplate compression fracture at T9. CT ABDOMEN PELVIS FINDINGS Hepatobiliary: A rim enhancing lesion in the left hepatic lobe measures 2.2 by 1.6 cm. There is a more diffusely enhancing lesion in this vicinity on 05/31/2009 measuring 1.6 by 1.3 cm. Gallbladder wall thickening is present along with mild contraction of the gallbladder. Pancreas: Unremarkable Spleen: Unremarkable Adrenals/Urinary Tract: Numerous cysts are present in both kidneys and demonstrate a variable degree of complexity fullness of both adrenal glands without discrete mass. No urinary tract calculi observed. Stomach/Bowel: Mild prominence of stool in the colon and rectum. Vascular/Lymphatic: Aortoiliac atherosclerotic vascular disease. Left external iliac node 1.2 cm in short axis on image 116/2, formerly 0.9 cm. Reproductive: Unremarkable Other: Asymmetric subcutaneous edema in the right upper thigh. Musculoskeletal: Diffuse bony sclerosis. Fused sacroiliac joints. Age-indeterminate per probably late subacute superior endplate compression fracture at L2 with 25% loss of height. IMPRESSION: 1. Moderate to large pericardial effusion, not present on 05/31/2009. Correlate with any jugular venous distention or signs of Beck's triad in assessing for tamponade. 2. There is subcutaneous edema in the right lateral thigh which appears asymmetrically increased compared to the left, but is otherwise nonspecific. There is also a mildly enlarged right external iliac lymph node. Strictly speaking an infectious process such as cellulitis is not excluded, correlate with physical exam findings in determining need for further workup. 3. 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 Some of the renal cystic lesions are complex but otherwise technically nonspecific. 4. Renal osteodystrophy. 5. Late subacute superior endplate compression fracture at L2. 6. Aortic Atherosclerosis (ICD10-I70.0). Coronary atherosclerosis with mitral and aortic valve  calcification as well as cardiomegaly. 7. Airway thickening is present, suggesting bronchitis or reactive airways disease. 8. Asymmetric gynecomastia, striking on the left side. 9. Rim enhancing lesion in the left hepatic lobe has mildly enlarged compared to the previous diffusely enhancing lesion shown on 05/31/2009. The mild change over an intervening 9 year period favors a benign etiology such as hemangioma, although lesion is technically nonspecific. 10.  Prominent stool throughout the colon favors constipation. The top 2 impressions, which are likely the most relevant for the patient's immediate clinical care, will be called to the ordering clinician or representative by the Radiologist Assistant, and communication documented in the PACS or zVision Dashboard. Electronically Signed   By: Van Clines M.D.   On: 09/26/2018 19:18      Allergies as of 09/28/2018   No Known Allergies  Medication List    STOP taking these medications   apixaban 5 MG Tabs tablet Commonly known as:  ELIQUIS   gabapentin 300 MG capsule Commonly known as:  NEURONTIN   metoprolol tartrate 25 MG tablet Commonly known as:  LOPRESSOR     TAKE these medications   acetaminophen 325 MG tablet Commonly known as:  TYLENOL Take 650 mg by mouth every 6 (six) hours as needed.   amiodarone 400 MG tablet Commonly known as:  PACERONE Take 1 tablet (400 mg total) by mouth 2 (two) times daily. Take 400 mg by mouth twice a day until September 30, 2018 then take 200 mg twice a day for 1 week On December 25 changed to 200 mg daily What changed:    medication strength  how much to take  when to take this  additional instructions   aspirin EC 81 MG tablet Take 81 mg by mouth daily.   b complex-vitamin c-folic acid 0.8 MG Tabs tablet Take 1 tablet by mouth daily.   cinacalcet 60 MG tablet Commonly known as:  SENSIPAR Take 60 mg by mouth daily.   ciprofloxacin-dexamethasone OTIC suspension Commonly  known as:  CIPRODEX Place 4 drops into the right ear 2 (two) times daily for 3 days.   feeding supplement (NEPRO CARB STEADY) Liqd Take 237 mLs by mouth 2 (two) times daily between meals.   feeding supplement (PRO-STAT SUGAR FREE 64) Liqd Take 30 mLs by mouth daily.   ferrous sulfate 325 (65 FE) MG EC tablet Take 325 mg by mouth 3 (three) times daily with meals.   Gerhardt's butt cream Crea Apply 1 application topically 4 (four) times daily.   glipiZIDE 10 MG tablet Commonly known as:  GLUCOTROL Take 10 mg by mouth daily.   lidocaine-prilocaine cream Commonly known as:  EMLA Apply 1 application topically as needed (port access).   midodrine 10 MG tablet Commonly known as:  PROAMATINE Take 1 tablet (10 mg total) by mouth 3 (three) times daily with meals. What changed:    medication strength  how much to take   omeprazole 40 MG capsule Commonly known as:  PRILOSEC Take 40 mg by mouth 2 (two) times daily.   oxyCODONE 5 MG immediate release tablet Commonly known as:  Oxy IR/ROXICODONE Take 1 tablet (5 mg total) by mouth every 6 (six) hours as needed for moderate pain or severe pain.   polyethylene glycol packet Commonly known as:  MIRALAX / GLYCOLAX Take 17 g by mouth 2 (two) times daily.   senna-docusate 8.6-50 MG tablet Commonly known as:  Senokot-S Take 1 tablet by mouth 2 (two) times daily.   sevelamer carbonate 800 MG tablet Commonly known as:  RENVELA Take 2,400 mg by mouth 3 (three) times daily with meals.   vancomycin 1-5 GM/200ML-% Soln Commonly known as:  VANCOCIN Inject 200 mLs (1,000 mg total) into the vein every Monday, Wednesday, and Friday with hemodialysis for 7 days. Start taking on:  September 29, 2018            Durable Medical Equipment  (From admission, onward)         Start     Ordered   09/25/18 1215  For home use only DME Wheelchair electric  Once     09/25/18 1215              Management plans discussed with the patient  and wife and they are in agreement. Stable for discharge snf  Patient should follow up  with pcp  CODE STATUS:     Code Status Orders  (From admission, onward)         Start     Ordered   09/21/18 2045  Full code  Continuous     09/21/18 2044        Code Status History    Date Active Date Inactive Code Status Order ID Comments User Context   09/03/2018 2041 09/15/2018 2255 Full Code [id number]  Vaughan Basta, MD ED   08/12/2018 1629 08/14/2018 1324 Full Code [id number]  [name], MD ED   07/24/2018 1246 07/24/2018 1759 Full Code [id number]  [name], MD Inpatient   01/09/2017 1135 01/09/2017 1531 Full Code [id number]  [name], MD Inpatient   04/28/2016 1034 05/02/2016 2139 Full Code [id number]  Flora Lipps, MD Inpatient   04/25/2016 1340 04/28/2016 1034 Full Code [id number]  Schnier, Dolores Lory, MD Inpatient    Advance Directive Documentation     Most Recent Value  Type of Advance Directive  Out of facility DNR (pink MOST or yellow form)  Pre-existing out of facility DNR order (yellow form or pink MOST form)  Pink MOST form placed in chart (order not valid for inpatient use)  "MOST" Form in Place?  -      TOTAL TIME TAKING CARE OF THIS PATIENT: 39 minutes.    Note: This dictation was prepared with Dragon dictation along with smaller phrase technology. Any transcriptional errors that result from this process are unintentional.  Daniele Dillow M.D on 09/28/2018 at 9:34 AM  Between 7am to 6pm - Pager - [phone] After 6pm go to [email or web address] - password [email or web address] [facility]  Office  [phone]  CC: Primary care physician; [name], MD

## 2018-09-28 NOTE — Progress Notes (Signed)
Report called to [facility].  I then rechecked patients VSS and BP was low.  Midodrine given and Dr Benjie Karvonen notified.  BP now 11/57.  Patient being transferred via stretcher by EMS

## 2018-09-28 NOTE — Clinical Social Work Note (Signed)
The patient will discharge today to [facility] via non-emergent EMS. The patient and facility are aware and in agreement. The CSW has sent all documentation to the facility and has delivered the discharge packet to the chart including hard scripts and the MOST form. The CSW is signing off. Please consult should needs arise.  Santiago Bumpers, MSW, Latanya Presser [phone]

## 2018-10-02 ENCOUNTER — Encounter: Payer: Self-pay | Admitting: Nurse Practitioner

## 2018-10-02 ENCOUNTER — Ambulatory Visit (INDEPENDENT_AMBULATORY_CARE_PROVIDER_SITE_OTHER): Payer: Medicare Other | Admitting: Nurse Practitioner

## 2018-10-02 VITALS — BP 100/80 | HR 94 | Ht 67.0 in | Wt 217.3 lb

## 2018-10-02 DIAGNOSIS — I480 Paroxysmal atrial fibrillation: Secondary | ICD-10-CM | POA: Diagnosis not present

## 2018-10-02 DIAGNOSIS — N186 End stage renal disease: Secondary | ICD-10-CM

## 2018-10-02 DIAGNOSIS — I25119 Atherosclerotic heart disease of native coronary artery with unspecified angina pectoris: Secondary | ICD-10-CM | POA: Diagnosis not present

## 2018-10-02 DIAGNOSIS — I739 Peripheral vascular disease, unspecified: Secondary | ICD-10-CM | POA: Diagnosis not present

## 2018-10-02 DIAGNOSIS — I313 Pericardial effusion (noninflammatory): Secondary | ICD-10-CM

## 2018-10-02 DIAGNOSIS — I3139 Other pericardial effusion (noninflammatory): Secondary | ICD-10-CM

## 2018-10-02 DIAGNOSIS — I70269 Atherosclerosis of native arteries of extremities with gangrene, unspecified extremity: Secondary | ICD-10-CM | POA: Diagnosis not present

## 2018-10-02 DIAGNOSIS — D638 Anemia in other chronic diseases classified elsewhere: Secondary | ICD-10-CM

## 2018-10-02 MED ORDER — AMIODARONE HCL 200 MG PO TABS: 200.0000 mg | ORAL_TABLET | Freq: Two times a day (BID) | ORAL | 2 refills | Status: DC

## 2018-10-02 NOTE — Patient Instructions (Signed)
Medication Instructions:  Your physician has recommended you make the following change in your medication:  1- CHANGE Amiodarone to 200 mg by mouth two times a day.  If you need a refill on your cardiac medications before your next appointment, please call your pharmacy.   Lab work: none If you have labs (blood work) drawn today and your tests are completely normal, you will receive your results only by: Marland Kitchen MyChart Message (if you have MyChart) OR . A paper copy in the mail If you have any lab test that is abnormal or we need to change your treatment, we will call you to review the results.  Testing/Procedures: none  Follow-Up: At [facility], you and your health needs are our priority.  As part of our continuing mission to provide you with exceptional heart care, we have created designated Provider Care Teams.  These Care Teams include your primary Cardiologist (physician) and Advanced Practice Providers (APPs -  Physician Assistants and Nurse Practitioners) who all work together to provide you with the care you need, when you need it. You will need a follow up appointment in 1 months.  You may see Kathlyn Sacramento, MD or one of the following Advanced Practice Providers on your designated Care Team:   Murray Hodgkins, NP Christell Faith, PA-C . Marrianne Mood, PA-C

## 2018-10-02 NOTE — Progress Notes (Signed)
Office Visit    Patient Name: [name] Date of Encounter: 10/02/2018  Primary Care Provider:  [name], MD Primary Cardiologist:  Kathlyn Sacramento, MD  Chief Complaint    57 y/o ? with a history of coronary artery disease, previous LV dysfunction with subsequent recovery, type 2 diabetes mellitus, end-stage renal disease on dialysis, relative hypotension, hyper lipidemia, peripheral arterial disease status post left BKA and right AKA, stroke, and recent development of rapid atrial fibrillation, who presents for follow-up after recent hospitalization.  Past Medical History    Past Medical History:  Diagnosis Date  . (HFpEF) heart failure with preserved ejection fraction ([place])    a. 2017 EF per LHC 30-35%; b. 09/2018 Echo: EF 55-60%, no rwma, Gr2 DD, mild MR, mildly dil LA. PASP 51mmHg. small to mod circumferential pericardial eff, no tamponade.  . Anemia of chronic disease   . Atherosclerosis   . CAD (coronary artery disease)    a. 2017 LHC, severe 1v disease, occluded pRCA, L-R collaterals, moderate LV dysfunction EF 30-35.  . Diabetes mellitus with complication ([place])   . End stage renal disease on dialysis ([place])    Labile BP  . History of ventricular tachycardia    a. 2017 polymorphic VT and cardiac arrest s/p angioplasty   . Hypercholesteremia   . Hyperlipidemia LDL goal <70   . Hypertension   . Labile blood pressure    on HD  . NSVT (nonsustained ventricular tachycardia) ([place])    08/2018 NSVT  . PAF (paroxysmal atrial fibrillation) ([place])    a. 08/2018 CHA2DS2VASc at least 6-->initially placed on eliquis but d/c'd 2/2 anemia. On Amio.  Marland Kitchen Peripheral arterial occlusive disease (HCC)    s/p L BKA, R AKA  . S/P AKA (above knee amputation) unilateral, right ([place])    08/2018  . S/P BKA (below knee amputation) unilateral, left ([place])    2015  . Stroke [facility])    Past Surgical History:  Procedure Laterality Date  . A/V FISTULAGRAM Left 01/09/2017   Procedure: A/V Fistulagram;  Surgeon: [name], MD;  Location: [place] CV [facility];  Service: Cardiovascular;  Laterality: Left;  . A/V FISTULAGRAM Left 06/06/2017   Procedure: A/V Fistulagram;  Surgeon: [name], MD;  Location: [place] [facility];  Service: Cardiovascular;  Laterality: Left;  . A/V FISTULAGRAM Left 08/15/2017   Procedure: A/V Fistulagram;  Surgeon: [name], MD;  Location: [place] [facility];  Service: Cardiovascular;  Laterality: Left;  . A/V FISTULAGRAM Left 01/22/2018   Procedure: A/V FISTULAGRAM;  Surgeon: [name], MD;  Location: [place] [facility];  Service: Cardiovascular;  Laterality: Left;  . A/V SHUNT INTERVENTION N/A 06/06/2017   Procedure: A/V SHUNT INTERVENTION;  Surgeon: [name], MD;  Location: [place] [facility];  Service: Cardiovascular;  Laterality: N/A;  . AMPUTATION Right 04/25/2016   Procedure: TOE AMPUTATION;  Surgeon: Katha Cabal, MD;  Location: [facility] OR[facility];  Service: Vascular;  Laterality: Right;  . AMPUTATION Right 09/09/2018   Procedure: AMPUTATION ABOVE KNEE;  Surgeon: [name], MD;  Location: [facility];  Service: Vascular;  Laterality: Right;  . APPLICATION OF WOUND VAC Right 04/25/2016   Procedure: APPLICATION OF WOUND VAC;  Surgeon: Katha Cabal, MD;  Location: [facility];  Service: Vascular;  Laterality: Right;  . AV FISTULA PLACEMENT Left 2015  . CARDIAC CATHETERIZATION N/A 04/30/2016   Procedure: Left Heart Cath and Coronary Angiography;  Surgeon: Wellington Hampshire,  MD;  Location: [place] [facility];  Service: Cardiovascular;  Laterality: N/A;  . CATARACT EXTRACTION, BILATERAL    . LEG AMPUTATION BELOW KNEE Left   . LOWER EXTREMITY ANGIOGRAPHY Right 07/24/2018   Procedure: LOWER EXTREMITY ANGIOGRAPHY;  Surgeon: [name], MD;  Location: [place] [facility];  Service: Cardiovascular;  Laterality: Right;  . LOWER EXTREMITY ANGIOGRAPHY Right 08/13/2018   Procedure: LOWER EXTREMITY ANGIOGRAPHY;  Surgeon: [name], MD;  Location: [place] [facility];  Service: Cardiovascular;  Laterality: Right;  . LOWER EXTREMITY ANGIOGRAPHY Right 09/04/2018   Procedure: Lower Extremity Angiography;  Surgeon: [name], MD;  Location: [place] [facility];  Service: Cardiovascular;  Laterality: Right;  . PERIPHERAL VASCULAR CATHETERIZATION N/A 10/03/2015   Procedure: A/V Shuntogram/Fistulagram;  Surgeon: [name], MD;  Location: [place] [facility];  Service: Cardiovascular;  Laterality: N/A;  . PERIPHERAL VASCULAR CATHETERIZATION N/A 01/16/2016   Procedure: Abdominal Aortogram w/Lower Extremity;  Surgeon: [name], MD;  Location: [place] [facility];  Service: Cardiovascular;  Laterality: N/A;  . PERIPHERAL VASCULAR CATHETERIZATION  01/16/2016   Procedure: Lower Extremity Intervention;  Surgeon: [name], MD;  Location: [place] [facility];  Service: Cardiovascular;;  . PERIPHERAL VASCULAR CATHETERIZATION Right 04/27/2016   Procedure: Lower Extremity Angiography;  Surgeon: Katha Cabal, MD;  Location: [place] [facility];  Service: Cardiovascular;  Laterality: Right;  . PERIPHERAL VASCULAR CATHETERIZATION  04/27/2016   Procedure: Lower Extremity Intervention;  Surgeon: Katha Cabal, MD;  Location: [place] [facility];  Service: Cardiovascular;;  . TRANSMETATARSAL AMPUTATION Right 04/28/2016   Procedure: TRANSMETATARSAL AMPUTATION;  Surgeon: Albertine Patricia, DPM;  Location: [facility];  Service: Podiatry;  Laterality: Right;  . 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  Other history includes ischemic cardiomyopathy with subsequent recovery of LV function, peripheral arterial disease status post left BKA and right AKA, prior stroke, end-stage renal disease on dialysis, relative hypotension/labile blood pressures, hyperlipidemia, type 2 diabetes mellitus, and anemia.  Patient was recently admitted to [facility] with acute limb ischemia of the right lower extremity requiring right AKA.  On the morning of November 27, he developed rapid atrial fibrillation and also nonsustained ventricular tachycardia.  He was placed on IV amiodarone and this was subsequently converted to oral amiodarone.  He was also placed on Eliquis therapy and subsequently discharged.  He was readmitted December 8 with chest pain fevers and chills.  He was back in atrial fibrillation.  He was also anemic with a hemoglobin of 6.8 and required blood transfusion.  Troponin was mildly elevated at 0.26.  This was felt to be secondary to demand ischemia in the setting of rapid A. fib and sepsis.  He was not felt to be a candidate for invasive procedures such as diagnostic catheterization.  As he required transfusion, decision was made to discontinue oral anticoagulation.  Echocardiogram during this admission showed normal LV function with a small to moderate pericardial effusion and no tamponade physiology.  It was not felt that further work-up was required.  He was reloaded with amiodarone and subsequently converted to sinus rhythm.  [facility]  Patient says that since his discharge he has been tolerating dialysis well.  He sometimes has mild chest discomfort at the end of dialysis in the setting of elevated heart rates.  He has not any significant dyspnea though activity is limited.  He is in atrial fibrillation today and is currently asymptomatic with heart rate in the 90s.  He denies PND, orthopnea, dizziness, syncope, edema, or early satiety.  He notes that  the dressing on his right lower extremity has not been changed in several days and his wife is upset by this.   Home Medications    Prior to Admission medications   Medication Sig Start Date End Date Taking? Authorizing Provider  acetaminophen (TYLENOL) 325 MG tablet Take 650 mg by mouth every 6 (six) hours as needed.    [provider]  Amino Acids-Protein Hydrolys (FEEDING SUPPLEMENT, PRO-STAT SUGAR FREE 64,) LIQD Take 30 mLs by mouth daily.    [provider]  amiodarone (PACERONE) 400 MG tablet Take 1 tablet (400 mg total) by mouth 2 (two) times daily. Take 400 mg by mouth twice a day until September 30, 2018 then take 200 mg twice a day for 1 week On December 25 changed to 200 mg daily 09/28/18   Bettey Costa, MD  aspirin EC 81 MG tablet Take 81 mg by mouth daily.    [provider]  b complex-vitamin c-folic acid (NEPHRO-VITE) 0.8 MG TABS tablet Take 1 tablet by mouth daily.    [provider]  cinacalcet (SENSIPAR) 60 MG tablet Take 60 mg by mouth daily.     [provider]  ferrous sulfate 325 (65 FE) MG EC tablet Take 325 mg by mouth 3 (three) times daily with meals.    [provider]  glipiZIDE (GLUCOTROL) 10 MG tablet Take 10 mg by mouth daily.  10/25/16   [provider]  Hydrocortisone (GERHARDT'S BUTT CREAM) CREA Apply 1 application topically 4 (four) times daily. 09/28/18   Bettey Costa, MD  lidocaine-prilocaine (EMLA) cream Apply 1 application topically as needed (port access).    [provider]  midodrine (PROAMATINE) 10 MG tablet Take 1 tablet (10 mg total) by mouth 3 (three) times daily with meals. 09/28/18   Bettey Costa, MD  Nutritional Supplements (FEEDING SUPPLEMENT, NEPRO CARB STEADY,) LIQD Take 237 mLs by mouth 2 (two) times daily between meals. 09/28/18   Bettey Costa, MD  omeprazole (PRILOSEC) 40 MG capsule Take 40 mg by mouth 2 (two) times daily.     [provider]  oxyCODONE (OXY  IR/ROXICODONE) 5 MG immediate release tablet Take 1 tablet (5 mg total) by mouth every 6 (six) hours as needed for moderate pain or severe pain. 09/28/18   Bettey Costa, MD  polyethylene glycol (MIRALAX / GLYCOLAX) packet Take 17 g by mouth 2 (two) times daily.     [provider]  senna-docusate (SENOKOT-S) 8.6-50 MG tablet Take 1 tablet by mouth 2 (two) times daily.    [provider]  sevelamer carbonate (RENVELA) 800 MG tablet Take 2,400 mg by mouth 3 (three) times daily with meals.     [provider]  vancomycin (VANCOCIN) 1-5 GM/200ML-% SOLN Inject 200 mLs (1,000 mg total) into the vein every Monday, Wednesday, and Friday with hemodialysis for 7 days. 09/29/18 10/06/18  Bettey Costa, MD    Review of Systems    Patient  says that overall he is doing well and is tolerating dialysis.  His wife reports that he has had mild chest discomfort after dialysis but he minimizes this.  He has not had any significant dyspnea or palpitations.  No PND, orthopnea, dizziness, syncope, edema, or early satiety.  All other systems reviewed and are otherwise negative except as noted above.  Physical Exam    VS:  BP 100/80 (BP Location: Right Arm, Patient Position: Sitting, Cuff Size: Normal)   Pulse 94   Ht 5\' 7"  (1.702 m)   Wt 217 lb 5 oz (98.6 kg) Comment: checked at [facility] on 09/29/2018.  BMI 34.04 kg/m  , BMI Body mass index is 34.04 kg/m. GEN: Well nourished, well developed, in no acute distress. HEENT: normal. Neck: Supple, no JVD, carotid bruits, or masses. Cardiac: Irregularly irregular, no murmurs, rubs, or gallops. No clubbing, cyanosis, edema.  Left BKA, right AKA.  Dressing to right AKA site clean and dry without drainage.  Respiratory:  Respirations regular and unlabored, crackles in the left base. GI: Soft, nontender, nondistended, BS + x 4. MS: no deformity or atrophy. Skin: warm and dry, no rash. Neuro:  Strength and sensation are  intact. Psych: Normal affect.  Accessory Clinical Findings    ECG personally reviewed by me today -atrial fibrillation, 94, low voltage, nonspecific ST and T changes.- no acute changes.  Assessment & Plan    1.  Paroxysmal atrial fibrillation: Patient with 2 recent admissions with development of rapid atrial fibrillation.  On the initial occasion, amiodarone was started and he converted quickly to sinus rhythm however he had recurrent A. fib in the setting of sepsis and chest pain last week requiring additional loading of amiodarone.  It appears that he has only been receiving amiodarone 200 mg daily and he is back in A. fib today.  He is not symptomatic.  His wife notes that he has had some elevated heart rates and chest discomfort at the end of dialysis the patient minimizes this complaint.  Rate is 94 today.  I will increase amiodarone to 200 mg twice daily at least until we see him back in clinic.  He is not on oral anticoagulation in the setting of recent anemia requiring transfusions.    2.  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  He did have chest pain and mild troponin elevation in the setting of sepsis and rapid A. fib on most recent hospitalization.  He is a poor candidate for further cardiac diagnostic procedures and medical therapy has been recommended although it is limited secondary to soft blood pressures.  Patient says he has not been having any significant chest discomfort or dyspnea.  His wife notes that he has had some chest discomfort at the end of dialysis though again, patient minimizes this and says that he has tolerated dialysis well.  He remains on low-dose aspirin.  3.  End-stage renal disease: Hemodialysis per nephrology.  4.  Anemia of chronic disease: Status post recent hospitalization requiring blood transfusions.  Patient denies any active bleeding.  He received blood work through hemodialysis.  5.   Peripheral arterial disease: Status post recent right AKA: Recommend regular dressing changes as previously prescribed by vascular surgery.  He has follow-up with vascular surgery in January.  Continue aspirin.  6.  Pericardial effusion: Small to moderate.  No hemodynamic compromise.  No rub on exam today.  7.  Diabetes mellitus: Per  primary provider.  8.  Disposition: Increasing amiodarone to 200 mg twice daily at least until we see him back in clinic.  Based on paperwork sent today it appears that he is on 200 mg daily currently.  Follow-up in clinic in 1 month or sooner if necessary.  60 mins spent reviewing hospital, SNF records, and time with patient.  Murray Hodgkins, NP 10/02/2018, 9:51 AM

## 2018-10-09 ENCOUNTER — Inpatient Hospital Stay: Payer: Medicare Other

## 2018-10-09 ENCOUNTER — Encounter: Payer: Self-pay | Admitting: Emergency Medicine

## 2018-10-09 ENCOUNTER — Ambulatory Visit (INDEPENDENT_AMBULATORY_CARE_PROVIDER_SITE_OTHER): Payer: Medicare Other | Admitting: Nurse Practitioner

## 2018-10-09 ENCOUNTER — Inpatient Hospital Stay
Admission: EM | Admit: 2018-10-09 | Discharge: 2018-10-11 | DRG: 871 | Disposition: A | Discharge status: Home Health | Payer: Medicare Other | Attending: Internal Medicine | Admitting: Internal Medicine

## 2018-10-09 ENCOUNTER — Other Ambulatory Visit: Payer: Self-pay

## 2018-10-09 ENCOUNTER — Emergency Department: Payer: Medicare Other

## 2018-10-09 DIAGNOSIS — Z992 Dependence on renal dialysis: Secondary | ICD-10-CM | POA: Diagnosis not present

## 2018-10-09 DIAGNOSIS — Z833 Family history of diabetes mellitus: Secondary | ICD-10-CM

## 2018-10-09 DIAGNOSIS — I4819 Other persistent atrial fibrillation: Secondary | ICD-10-CM | POA: Diagnosis not present

## 2018-10-09 DIAGNOSIS — Z8042 Family history of malignant neoplasm of prostate: Secondary | ICD-10-CM | POA: Diagnosis not present

## 2018-10-09 DIAGNOSIS — A419 Sepsis, unspecified organism: Principal | ICD-10-CM | POA: Diagnosis present

## 2018-10-09 DIAGNOSIS — Z89512 Acquired absence of left leg below knee: Secondary | ICD-10-CM | POA: Diagnosis not present

## 2018-10-09 DIAGNOSIS — Z7984 Long term (current) use of oral hypoglycemic drugs: Secondary | ICD-10-CM | POA: Diagnosis not present

## 2018-10-09 DIAGNOSIS — Z8673 Personal history of transient ischemic attack (TIA), and cerebral infarction without residual deficits: Secondary | ICD-10-CM | POA: Diagnosis not present

## 2018-10-09 DIAGNOSIS — E875 Hyperkalemia: Secondary | ICD-10-CM | POA: Diagnosis present

## 2018-10-09 DIAGNOSIS — I2510 Atherosclerotic heart disease of native coronary artery without angina pectoris: Secondary | ICD-10-CM | POA: Diagnosis present

## 2018-10-09 DIAGNOSIS — I2582 Chronic total occlusion of coronary artery: Secondary | ICD-10-CM | POA: Diagnosis present

## 2018-10-09 DIAGNOSIS — E785 Hyperlipidemia, unspecified: Secondary | ICD-10-CM | POA: Diagnosis present

## 2018-10-09 DIAGNOSIS — Z7982 Long term (current) use of aspirin: Secondary | ICD-10-CM

## 2018-10-09 DIAGNOSIS — T8753 Necrosis of amputation stump, right lower extremity: Secondary | ICD-10-CM | POA: Diagnosis present

## 2018-10-09 DIAGNOSIS — Z89611 Acquired absence of right leg above knee: Secondary | ICD-10-CM | POA: Diagnosis not present

## 2018-10-09 DIAGNOSIS — R0602 Shortness of breath: Secondary | ICD-10-CM | POA: Diagnosis present

## 2018-10-09 DIAGNOSIS — I252 Old myocardial infarction: Secondary | ICD-10-CM

## 2018-10-09 DIAGNOSIS — M79604 Pain in right leg: Secondary | ICD-10-CM

## 2018-10-09 DIAGNOSIS — E1151 Type 2 diabetes mellitus with diabetic peripheral angiopathy without gangrene: Secondary | ICD-10-CM | POA: Diagnosis present

## 2018-10-09 DIAGNOSIS — I5032 Chronic diastolic (congestive) heart failure: Secondary | ICD-10-CM

## 2018-10-09 DIAGNOSIS — Z8674 Personal history of sudden cardiac arrest: Secondary | ICD-10-CM | POA: Diagnosis not present

## 2018-10-09 DIAGNOSIS — E7800 Pure hypercholesterolemia, unspecified: Secondary | ICD-10-CM | POA: Diagnosis present

## 2018-10-09 DIAGNOSIS — I493 Ventricular premature depolarization: Secondary | ICD-10-CM | POA: Diagnosis not present

## 2018-10-09 DIAGNOSIS — I5043 Acute on chronic combined systolic (congestive) and diastolic (congestive) heart failure: Secondary | ICD-10-CM | POA: Diagnosis present

## 2018-10-09 DIAGNOSIS — I82409 Acute embolism and thrombosis of unspecified deep veins of unspecified lower extremity: Secondary | ICD-10-CM

## 2018-10-09 DIAGNOSIS — J810 Acute pulmonary edema: Secondary | ICD-10-CM

## 2018-10-09 DIAGNOSIS — R0902 Hypoxemia: Secondary | ICD-10-CM | POA: Diagnosis present

## 2018-10-09 DIAGNOSIS — I132 Hypertensive heart and chronic kidney disease with heart failure and with stage 5 chronic kidney disease, or end stage renal disease: Secondary | ICD-10-CM | POA: Diagnosis present

## 2018-10-09 DIAGNOSIS — N2581 Secondary hyperparathyroidism of renal origin: Secondary | ICD-10-CM | POA: Diagnosis present

## 2018-10-09 DIAGNOSIS — N186 End stage renal disease: Secondary | ICD-10-CM | POA: Diagnosis present

## 2018-10-09 DIAGNOSIS — D631 Anemia in chronic kidney disease: Secondary | ICD-10-CM | POA: Diagnosis present

## 2018-10-09 DIAGNOSIS — I480 Paroxysmal atrial fibrillation: Secondary | ICD-10-CM | POA: Diagnosis present

## 2018-10-09 DIAGNOSIS — E1122 Type 2 diabetes mellitus with diabetic chronic kidney disease: Secondary | ICD-10-CM | POA: Diagnosis present

## 2018-10-09 LAB — CBC
HCT: 31 % — ABNORMAL LOW (ref 39.0–52.0)
Hemoglobin: 9.2 g/dL — ABNORMAL LOW (ref 13.0–17.0)
MCH: 28.7 pg (ref 26.0–34.0)
MCHC: 29.7 g/dL — ABNORMAL LOW (ref 30.0–36.0)
MCV: 96.6 fL (ref 80.0–100.0)
Platelets: 469 10*3/uL — ABNORMAL HIGH (ref 150–400)
RBC: 3.21 MIL/uL — ABNORMAL LOW (ref 4.22–5.81)
RDW: 17.1 % — AB (ref 11.5–15.5)
WBC: 19.5 10*3/uL — ABNORMAL HIGH (ref 4.0–10.5)
nRBC: 0 % (ref 0.0–0.2)

## 2018-10-09 LAB — BRAIN NATRIURETIC PEPTIDE: B Natriuretic Peptide: 3708 pg/mL — ABNORMAL HIGH (ref 0.0–100.0)

## 2018-10-09 LAB — BASIC METABOLIC PANEL
Anion gap: 17 — ABNORMAL HIGH (ref 5–15)
BUN: 76 mg/dL — ABNORMAL HIGH (ref 6–20)
CALCIUM: 8.8 mg/dL — AB (ref 8.9–10.3)
CO2: 25 mmol/L (ref 22–32)
Chloride: 93 mmol/L — ABNORMAL LOW (ref 98–111)
Creatinine, Ser: 8.4 mg/dL — ABNORMAL HIGH (ref 0.61–1.24)
GFR calc Af Amer: 7 mL/min — ABNORMAL LOW (ref >60)
GFR calc non Af Amer: 6 mL/min — ABNORMAL LOW (ref >60)
Glucose, Bld: 247 mg/dL — ABNORMAL HIGH (ref 70–99)
Potassium: 5.8 mmol/L — ABNORMAL HIGH (ref 3.5–5.1)
Sodium: 135 mmol/L (ref 135–145)

## 2018-10-09 LAB — TROPONIN I: Troponin I: 0.05 ng/mL (ref <0.03)

## 2018-10-09 LAB — CG4 I-STAT (LACTIC ACID): LACTIC ACID, VENOUS: 1.37 mmol/L (ref 0.5–1.9)

## 2018-10-09 LAB — PHOSPHORUS: Phosphorus: 3 mg/dL (ref 2.5–4.6)

## 2018-10-09 LAB — INFLUENZA PANEL BY PCR (TYPE A & B)
Influenza A By PCR: NEGATIVE
Influenza B By PCR: NEGATIVE

## 2018-10-09 MED ORDER — OXYCODONE HCL 5 MG PO TABS
5.0000 mg | ORAL_TABLET | Freq: Four times a day (QID) | ORAL | Status: DC | PRN
  Filled 2018-10-09 (×2): qty 1

## 2018-10-09 MED ORDER — ALTEPLASE 2 MG IJ SOLR
2.0000 mg | Freq: Once | INTRAMUSCULAR | Status: DC | PRN
  Filled 2018-10-09: qty 2

## 2018-10-09 MED ORDER — POLYETHYLENE GLYCOL 3350 17 G PO PACK: 17.0000 g | PACK | Freq: Two times a day (BID) | ORAL | Status: DC

## 2018-10-09 MED ORDER — VANCOMYCIN HCL IN DEXTROSE 1-5 GM/200ML-% IV SOLN
1000.0000 mg | Freq: Once | INTRAVENOUS | Status: DC
  Filled 2018-10-09: qty 200

## 2018-10-09 MED ORDER — LACTULOSE 10 GM/15ML PO SOLN
10.0000 g | Freq: Every day | ORAL | Status: DC
  Administered 2018-10-10 – 2018-10-11 (×2): 10 g via ORAL
  Filled 2018-10-09 (×2): qty 30

## 2018-10-09 MED ORDER — VANCOMYCIN HCL IN DEXTROSE 1-5 GM/200ML-% IV SOLN: 1000.0000 mg | Freq: Once | INTRAVENOUS | Status: DC

## 2018-10-09 MED ORDER — CHLORHEXIDINE GLUCONATE CLOTH 2 % EX PADS
6.0000 | MEDICATED_PAD | Freq: Every day | CUTANEOUS | Status: DC
  Filled 2018-10-09: qty 6

## 2018-10-09 MED ORDER — ACETAMINOPHEN 500 MG PO TABS
ORAL_TABLET | ORAL | Status: AC
  Administered 2018-10-09: 1000 mg via ORAL
  Filled 2018-10-09: qty 2

## 2018-10-09 MED ORDER — ONDANSETRON HCL 4 MG PO TABS: 4.0000 mg | ORAL_TABLET | Freq: Four times a day (QID) | ORAL | Status: DC | PRN

## 2018-10-09 MED ORDER — VANCOMYCIN HCL IN DEXTROSE 1-5 GM/200ML-% IV SOLN
1000.0000 mg | INTRAVENOUS | Status: DC
  Administered 2018-10-09 – 2018-10-10 (×2): 1000 mg via INTRAVENOUS
  Filled 2018-10-09: qty 200

## 2018-10-09 MED ORDER — LIDOCAINE-PRILOCAINE 2.5-2.5 % EX CREA: 1.0000 "application " | TOPICAL_CREAM | CUTANEOUS | Status: DC | PRN

## 2018-10-09 MED ORDER — LIDOCAINE-PRILOCAINE 2.5-2.5 % EX CREA
1.0000 "application " | TOPICAL_CREAM | CUTANEOUS | Status: DC | PRN
  Filled 2018-10-09: qty 5

## 2018-10-09 MED ORDER — METRONIDAZOLE IN NACL 5-0.79 MG/ML-% IV SOLN
500.0000 mg | Freq: Three times a day (TID) | INTRAVENOUS | Status: DC
  Administered 2018-10-09 – 2018-10-11 (×7): 500 mg via INTRAVENOUS
  Filled 2018-10-09 (×11): qty 100

## 2018-10-09 MED ORDER — SENNOSIDES-DOCUSATE SODIUM 8.6-50 MG PO TABS: 1.0000 | ORAL_TABLET | Freq: Two times a day (BID) | ORAL | Status: DC

## 2018-10-09 MED ORDER — GUAIFENESIN-DM 100-10 MG/5ML PO SYRP
5.0000 mL | ORAL_SOLUTION | ORAL | Status: DC | PRN
  Administered 2018-10-10 (×3): 5 mL via ORAL
  Filled 2018-10-09 (×3): qty 5

## 2018-10-09 MED ORDER — HEPARIN SODIUM (PORCINE) 1000 UNIT/ML DIALYSIS
1000.0000 [IU] | INTRAMUSCULAR | Status: DC | PRN
  Filled 2018-10-09: qty 1

## 2018-10-09 MED ORDER — POLYETHYLENE GLYCOL 3350 17 G PO PACK
17.0000 g | PACK | Freq: Two times a day (BID) | ORAL | Status: DC
  Administered 2018-10-09 – 2018-10-11 (×4): 17 g via ORAL
  Filled 2018-10-09 (×4): qty 1

## 2018-10-09 MED ORDER — ACETAMINOPHEN 325 MG PO TABS
650.0000 mg | ORAL_TABLET | Freq: Four times a day (QID) | ORAL | Status: DC | PRN
  Administered 2018-10-11: 650 mg via ORAL
  Filled 2018-10-09: qty 2

## 2018-10-09 MED ORDER — RENA-VITE PO TABS
1.0000 | ORAL_TABLET | Freq: Every day | ORAL | Status: DC
  Administered 2018-10-09 – 2018-10-10 (×2): 1 via ORAL
  Filled 2018-10-09 (×2): qty 1

## 2018-10-09 MED ORDER — SODIUM CHLORIDE 0.9 % IV SOLN
1.0000 g | INTRAVENOUS | Status: DC
  Administered 2018-10-10: 1 g via INTRAVENOUS
  Filled 2018-10-09 (×3): qty 1

## 2018-10-09 MED ORDER — FERROUS SULFATE 325 (65 FE) MG PO TABS
325.0000 mg | ORAL_TABLET | Freq: Three times a day (TID) | ORAL | Status: DC
  Administered 2018-10-10 – 2018-10-11 (×4): 325 mg via ORAL
  Filled 2018-10-09 (×4): qty 1

## 2018-10-09 MED ORDER — SODIUM CHLORIDE 0.9 % IV SOLN
2.0000 g | Freq: Once | INTRAVENOUS | Status: AC
  Administered 2018-10-09: 2 g via INTRAVENOUS
  Filled 2018-10-09: qty 2

## 2018-10-09 MED ORDER — SODIUM CHLORIDE 0.9 % IV SOLN
INTRAVENOUS | Status: DC | PRN
  Administered 2018-10-09 – 2018-10-11 (×6): 250 mL via INTRAVENOUS

## 2018-10-09 MED ORDER — MIDODRINE HCL 5 MG PO TABS
10.0000 mg | ORAL_TABLET | Freq: Three times a day (TID) | ORAL | Status: DC
  Administered 2018-10-10 – 2018-10-11 (×3): 10 mg via ORAL
  Filled 2018-10-09 (×6): qty 2

## 2018-10-09 MED ORDER — GLIPIZIDE 10 MG PO TABS
10.0000 mg | ORAL_TABLET | Freq: Every day | ORAL | Status: DC
  Administered 2018-10-10 – 2018-10-11 (×2): 10 mg via ORAL
  Filled 2018-10-09 (×2): qty 1

## 2018-10-09 MED ORDER — ALBUTEROL SULFATE (2.5 MG/3ML) 0.083% IN NEBU: 3.0000 mL | INHALATION_SOLUTION | RESPIRATORY_TRACT | Status: DC | PRN

## 2018-10-09 MED ORDER — PENTAFLUOROPROP-TETRAFLUOROETH EX AERO: 1.0000 "application " | INHALATION_SPRAY | CUTANEOUS | Status: DC | PRN

## 2018-10-09 MED ORDER — ALBUTEROL SULFATE (2.5 MG/3ML) 0.083% IN NEBU
2.5000 mg | INHALATION_SOLUTION | Freq: Four times a day (QID) | RESPIRATORY_TRACT | Status: DC
  Administered 2018-10-09 – 2018-10-11 (×7): 2.5 mg via RESPIRATORY_TRACT
  Filled 2018-10-09 (×6): qty 3

## 2018-10-09 MED ORDER — SODIUM CHLORIDE 0.9 % IV SOLN: 2.0000 g | Freq: Once | INTRAVENOUS | Status: DC

## 2018-10-09 MED ORDER — ONDANSETRON HCL 4 MG/2ML IJ SOLN: 4.0000 mg | Freq: Four times a day (QID) | INTRAMUSCULAR | Status: DC | PRN

## 2018-10-09 MED ORDER — VANCOMYCIN HCL IN DEXTROSE 1-5 GM/200ML-% IV SOLN
1000.0000 mg | Freq: Once | INTRAVENOUS | Status: AC
  Administered 2018-10-09: 1000 mg via INTRAVENOUS
  Filled 2018-10-09: qty 200

## 2018-10-09 MED ORDER — SODIUM CHLORIDE 0.9 % IV SOLN: 100.0000 mL | INTRAVENOUS | Status: DC | PRN

## 2018-10-09 MED ORDER — PANTOPRAZOLE SODIUM 40 MG PO TBEC
40.0000 mg | DELAYED_RELEASE_TABLET | Freq: Every day | ORAL | Status: DC
  Administered 2018-10-10 – 2018-10-11 (×2): 40 mg via ORAL
  Filled 2018-10-09 (×2): qty 1

## 2018-10-09 MED ORDER — ASPIRIN EC 81 MG PO TBEC
81.0000 mg | DELAYED_RELEASE_TABLET | Freq: Every day | ORAL | Status: DC
  Administered 2018-10-10 – 2018-10-11 (×2): 81 mg via ORAL
  Filled 2018-10-09 (×2): qty 1

## 2018-10-09 MED ORDER — SEVELAMER CARBONATE 800 MG PO TABS
2400.0000 mg | ORAL_TABLET | Freq: Three times a day (TID) | ORAL | Status: DC
  Administered 2018-10-10 – 2018-10-11 (×4): 2400 mg via ORAL
  Filled 2018-10-09 (×4): qty 3

## 2018-10-09 MED ORDER — ACETAMINOPHEN 500 MG PO TABS
1000.0000 mg | ORAL_TABLET | Freq: Once | ORAL | Status: AC
  Administered 2018-10-09: 1000 mg via ORAL

## 2018-10-09 MED ORDER — LIDOCAINE HCL (PF) 1 % IJ SOLN: 5.0000 mL | INTRAMUSCULAR | Status: DC | PRN

## 2018-10-09 MED ORDER — CINACALCET HCL 30 MG PO TABS
60.0000 mg | ORAL_TABLET | Freq: Every day | ORAL | Status: DC
  Administered 2018-10-11: 60 mg via ORAL
  Filled 2018-10-09 (×2): qty 2

## 2018-10-09 MED ORDER — METRONIDAZOLE IN NACL 5-0.79 MG/ML-% IV SOLN: 500.0000 mg | Freq: Three times a day (TID) | INTRAVENOUS | Status: DC

## 2018-10-09 MED ORDER — HEPARIN SODIUM (PORCINE) 5000 UNIT/ML IJ SOLN
5000.0000 [IU] | Freq: Three times a day (TID) | INTRAMUSCULAR | Status: DC
  Administered 2018-10-09 – 2018-10-11 (×5): 5000 [IU] via SUBCUTANEOUS
  Filled 2018-10-09 (×6): qty 1

## 2018-10-09 MED ORDER — PRO-STAT SUGAR FREE PO LIQD
30.0000 mL | Freq: Every day | ORAL | Status: DC
  Administered 2018-10-09 – 2018-10-11 (×3): 30 mL via ORAL

## 2018-10-09 MED ORDER — NEPRO/CARBSTEADY PO LIQD
237.0000 mL | Freq: Two times a day (BID) | ORAL | Status: DC
  Administered 2018-10-10 – 2018-10-11 (×3): 237 mL via ORAL

## 2018-10-09 MED ORDER — SENNOSIDES-DOCUSATE SODIUM 8.6-50 MG PO TABS
1.0000 | ORAL_TABLET | Freq: Two times a day (BID) | ORAL | Status: DC
  Administered 2018-10-09 – 2018-10-11 (×4): 1 via ORAL
  Filled 2018-10-09 (×4): qty 1

## 2018-10-09 MED ORDER — AMIODARONE HCL 200 MG PO TABS
200.0000 mg | ORAL_TABLET | Freq: Two times a day (BID) | ORAL | Status: DC
  Administered 2018-10-09 – 2018-10-11 (×4): 200 mg via ORAL
  Filled 2018-10-09 (×4): qty 1

## 2018-10-09 NOTE — H&P (Signed)
[facility] NAME: [name]    MR#:  [id number]  DATE OF BIRTH:  November 30, 1960  DATE OF ADMISSION:  10/09/2018  PRIMARY CARE PHYSICIAN: Alene Mires Elyse Jarvis, MD   REQUESTING/REFERRING PHYSICIAN:   CHIEF COMPLAINT:   Chief Complaint  Patient presents with  . Shortness of Breath    HISTORY OF PRESENT ILLNESS: Thailan Sava  is a 57 y.o. male with a known history per below status post recent right AKA by vascular surgery, presenting from nursing home with shortness of breath, anxious appearing, noted hypoxia with O2 saturation 88% on room air, patient complaining of cough, fever, in the emergency room patient was noted to be febrile, tachycardic, hypoxic, white count 19,000, chest x-ray noted for fluid overload/edema, BNP greater than 3700, potassium 5.8, creatinine 8.4, sepsis protocol initiated, patient evaluated in the emergency room, noted to be in mild distress due to shortness of breath from acute fluid overload, nephrology planning for hemodialysis later today, patient with right stump serosanguineous drainage/staples still in place, patient now be admitted for acute sepsis, acute hypoxic respiratory failure secondary to acute fluid overload/congestive heart failure exacerbation.  PAST MEDICAL HISTORY:   Past Medical History:  Diagnosis Date  . (HFpEF) heart failure with preserved ejection fraction ([place])    a. 2017 EF per LHC 30-35%; b. 09/2018 Echo: EF 55-60%, no rwma, Gr2 DD, mild MR, mildly dil LA. PASP 59mmHg. small to mod circumferential pericardial eff, no tamponade.  . Anemia of chronic disease   . Atherosclerosis   . CAD (coronary artery disease)    a. 2017 LHC, severe 1v disease, occluded pRCA, L-R collaterals, moderate LV dysfunction EF 30-35.  . Diabetes mellitus with complication ([place])   . End stage renal disease on dialysis ([place])    Labile BP  . History of ventricular tachycardia    a. 2017 polymorphic VT and cardiac  arrest.  . Hypercholesteremia   . Hyperlipidemia LDL goal <70   . Hypertension   . Labile blood pressure    on HD  . NSVT (nonsustained ventricular tachycardia) ([place])    08/2018 NSVT  . PAF (paroxysmal atrial fibrillation) ([place])    a. 08/2018 CHA2DS2VASc at least 6-->initially placed on eliquis but d/c'd 2/2 anemia. On Amio.  Marland Kitchen Peripheral arterial occlusive disease (HCC)    s/p L BKA, R AKA  . S/P AKA (above knee amputation) unilateral, right ([place])    08/2018  . S/P BKA (below knee amputation) unilateral, left ([place])    2015  . Stroke [facility])     PAST SURGICAL HISTORY:  Past Surgical History:  Procedure Laterality Date  . A/V FISTULAGRAM Left 01/09/2017   Procedure: A/V Fistulagram;  Surgeon: [name], MD;  Location: [place] [facility];  Service: Cardiovascular;  Laterality: Left;  . A/V FISTULAGRAM Left 06/06/2017   Procedure: A/V Fistulagram;  Surgeon: [name], MD;  Location: [place] [facility];  Service: Cardiovascular;  Laterality: Left;  . A/V FISTULAGRAM Left 08/15/2017   Procedure: A/V Fistulagram;  Surgeon: [name], MD;  Location: [place] [facility];  Service: Cardiovascular;  Laterality: Left;  . A/V FISTULAGRAM Left 01/22/2018   Procedure: A/V FISTULAGRAM;  Surgeon: [name], MD;  Location: [place] [facility];  Service: Cardiovascular;  Laterality: Left;  . A/V SHUNT INTERVENTION N/A 06/06/2017   Procedure: A/V SHUNT INTERVENTION;  Surgeon: [name], MD;  Location: [place] [facility];  Service: Cardiovascular;  Laterality:  N/A;  . AMPUTATION Right 04/25/2016   Procedure: TOE AMPUTATION;  Surgeon: Katha Cabal, MD;  Location: [facility];  Service: Vascular;  Laterality: Right;  . AMPUTATION Right 09/09/2018   Procedure: AMPUTATION ABOVE KNEE;  Surgeon: [name], MD;  Location: [facility];  Service: Vascular;  Laterality: Right;  . APPLICATION OF WOUND VAC Right 04/25/2016   Procedure: APPLICATION OF WOUND VAC;  Surgeon: Katha Cabal, MD;   Location: [facility];  Service: Vascular;  Laterality: Right;  . AV FISTULA PLACEMENT Left 2015  . CARDIAC CATHETERIZATION N/A 04/30/2016   Procedure: Left Heart Cath and Coronary Angiography;  Surgeon: Wellington Hampshire, MD;  Location: [place] [facility];  Service: Cardiovascular;  Laterality: N/A;  . CATARACT EXTRACTION, BILATERAL    . LEG AMPUTATION BELOW KNEE Left   . LOWER EXTREMITY ANGIOGRAPHY Right 07/24/2018   Procedure: LOWER EXTREMITY ANGIOGRAPHY;  Surgeon: [name], MD;  Location: [place] [facility];  Service: Cardiovascular;  Laterality: Right;  . LOWER EXTREMITY ANGIOGRAPHY Right 08/13/2018   Procedure: LOWER EXTREMITY ANGIOGRAPHY;  Surgeon: [name], MD;  Location: [place] [facility];  Service: Cardiovascular;  Laterality: Right;  . LOWER EXTREMITY ANGIOGRAPHY Right 09/04/2018   Procedure: Lower Extremity Angiography;  Surgeon: [name], MD;  Location: [place] [facility];  Service: Cardiovascular;  Laterality: Right;  . PERIPHERAL VASCULAR CATHETERIZATION N/A 10/03/2015   Procedure: A/V Shuntogram/Fistulagram;  Surgeon: [name], MD;  Location: [place] [facility];  Service: Cardiovascular;  Laterality: N/A;  . PERIPHERAL VASCULAR CATHETERIZATION N/A 01/16/2016   Procedure: Abdominal Aortogram w/Lower Extremity;  Surgeon: [name], MD;  Location: [place] [facility];  Service: Cardiovascular;  Laterality: N/A;  . PERIPHERAL VASCULAR CATHETERIZATION  01/16/2016   Procedure: Lower Extremity Intervention;  Surgeon: [name], MD;  Location: [place] [facility];  Service: Cardiovascular;;  . PERIPHERAL VASCULAR CATHETERIZATION Right 04/27/2016   Procedure: Lower Extremity Angiography;  Surgeon: Katha Cabal, MD;  Location: [place] [facility];  Service: Cardiovascular;  Laterality: Right;  . PERIPHERAL VASCULAR CATHETERIZATION  04/27/2016   Procedure: Lower Extremity Intervention;  Surgeon: Katha Cabal, MD;  Location: [place] [facility];  Service:  Cardiovascular;;  . TRANSMETATARSAL AMPUTATION Right 04/28/2016   Procedure: TRANSMETATARSAL AMPUTATION;  Surgeon: Albertine Patricia, DPM;  Location: [facility];  Service: Podiatry;  Laterality: Right;  . WOUND DEBRIDEMENT Right 04/25/2016   Procedure: DEBRIDEMENT WOUND;  Surgeon: Katha Cabal, MD;  Location: [facility];  Service: Vascular;  Laterality: Right;    SOCIAL HISTORY:  Social History   Tobacco Use  . Smoking status: Never Smoker  . Smokeless tobacco: Never Used  Substance Use Topics  . Alcohol use: No    FAMILY HISTORY:  Family History  Problem Relation Age of Onset  . Diabetes Father   . Prostate cancer Father     DRUG ALLERGIES: No Known Allergies  REVIEW OF SYSTEMS:   CONSTITUTIONAL: +fever, fatigue, weakness.  EYES: No blurred or double vision.  EARS, NOSE, AND THROAT: No tinnitus or ear pain.  RESPIRATORY: No cough, shortness of breath, wheezing or hemoptysis.  CARDIOVASCULAR: No chest pain, +orthopnea, edema.  GASTROINTESTINAL: No nausea, vomiting, diarrhea or abdominal pain.  GENITOURINARY: No dysuria, hematuria.  ENDOCRINE: No polyuria, nocturia,  HEMATOLOGY: No anemia, easy bruising or bleeding SKIN: No rash or lesion. MUSCULOSKELETAL: No joint pain or arthritis.   NEUROLOGIC: No tingling, numbness, weakness.  PSYCHIATRY: No anxiety or depression.  MEDICATIONS AT HOME:  Prior to Admission medications   Medication Sig Start Date End Date Taking? Authorizing Provider  acetaminophen (TYLENOL) 325 MG tablet Take 650 mg by mouth every 6 (six) hours as needed.   Yes [provider]  albuterol (ACCUNEB) 0.63 MG/3ML nebulizer solution Take 1 ampule by nebulization every 4 (four) hours as needed for wheezing.   Yes [provider]  aspirin EC 81 MG tablet Take 81 mg by mouth daily.   Yes [provider]  b complex-vitamin c-folic acid (NEPHRO-VITE) 0.8 MG TABS tablet Take 1 tablet by mouth daily.   Yes [provider]   cinacalcet (SENSIPAR) 60 MG tablet Take 60 mg by mouth daily.    Yes [provider]  ferrous sulfate 325 (65 FE) MG EC tablet Take 325 mg by mouth 3 (three) times daily with meals.   Yes [provider]  glipiZIDE (GLUCOTROL) 10 MG tablet Take 10 mg by mouth daily.  10/25/16  Yes [provider]  lactulose (CHRONULAC) 10 GM/15ML solution Take 10 g by mouth daily.   Yes [provider]  midodrine (PROAMATINE) 10 MG tablet Take 1 tablet (10 mg total) by mouth 3 (three) times daily with meals. 09/28/18  Yes Mody, Ulice Bold, MD  omeprazole (PRILOSEC) 40 MG capsule Take 40 mg by mouth 2 (two) times daily.    Yes [provider]  polyethylene glycol (MIRALAX / GLYCOLAX) packet Take 17 g by mouth 2 (two) times daily.    Yes [provider]  senna-docusate (SENOKOT-S) 8.6-50 MG tablet Take 1 tablet by mouth 2 (two) times daily.   Yes [provider]  sevelamer carbonate (RENVELA) 800 MG tablet Take 2,400 mg by mouth 3 (three) times daily with meals.    Yes [provider]  Amino Acids-Protein Hydrolys (FEEDING SUPPLEMENT, PRO-STAT SUGAR FREE 64,) LIQD Take 30 mLs by mouth daily.    [provider]  amiodarone (PACERONE) 200 MG tablet Take 1 tablet (200 mg total) by mouth 2 (two) times daily. Patient not taking: Reported on 10/09/2018 10/02/18   Theora Gianotti, NP  lidocaine-prilocaine (EMLA) cream Apply 1 application topically as needed (port access).    [provider]  Nutritional Supplements (FEEDING SUPPLEMENT, NEPRO CARB STEADY,) LIQD Take 237 mLs by mouth 2 (two) times daily between meals. 09/28/18   Bettey Costa, MD  oxyCODONE (OXY IR/ROXICODONE) 5 MG immediate release tablet Take 1 tablet (5 mg total) by mouth every 6 (six) hours as needed for moderate pain or severe pain. 09/28/18   Bettey Costa, MD      PHYSICAL EXAMINATION:   VITAL SIGNS: Blood pressure (!) 145/58, pulse 96, temperature 99.1 F  (37.3 C), temperature source Oral, resp. rate 20, SpO2 (!) 89 %.  GENERAL:  57 y.o.-year-old patient lying in the bed with no acute distress.  Morbid obesity EYES: Pupils equal, round, reactive to light and accommodation. No scleral icterus. Extraocular muscles intact.  HEENT: Head atraumatic, normocephalic. Oropharynx and nasopharynx clear.  NECK:  Supple, no jugular venous distention. No thyroid enlargement, no tenderness.  LUNGS: Diminished breath sounds with Rales bilaterally. No use of accessory muscles of respiration.  CARDIOVASCULAR: S1, S2 normal. No murmurs, rubs, or gallops.  ABDOMEN: Soft, nontender, nondistended. Bowel sounds present. No organomegaly or mass.  EXTREMITIES: No pedal edema, cyanosis, or clubbing.  NEUROLOGIC: Cranial nerves II through XII are intact. MAES Gait not checked.  PSYCHIATRIC: The patient is alert and oriented x 3.  SKIN: Bilateral lower  extremity amputee, right AKA stump with serosanguineous drainage/staples still in place from surgery 1 month ago LABORATORY PANEL:   CBC Recent Labs  Lab 10/09/18 0651  WBC 19.5*  HGB 9.2*  HCT 31.0*  PLT 469*  MCV 96.6  MCH 28.7  MCHC 29.7*  RDW 17.1*   ------------------------------------------------------------------------------------------------------------------  Chemistries  Recent Labs  Lab 10/09/18 0651  NA 135  K 5.8*  CL 93*  CO2 25  GLUCOSE 247*  BUN 76*  CREATININE 8.40*  CALCIUM 8.8*   ------------------------------------------------------------------------------------------------------------------ estimated creatinine clearance is 10.6 mL/min (A) (by C-G formula based on SCr of 8.4 mg/dL (H)). ------------------------------------------------------------------------------------------------------------------ No results for input(s): TSH, T4TOTAL, T3FREE, THYROIDAB in the last 72 hours.  Invalid input(s): FREET3   Coagulation profile No results for input(s): INR, PROTIME in the last  168 hours. ------------------------------------------------------------------------------------------------------------------- No results for input(s): DDIMER in the last 72 hours. -------------------------------------------------------------------------------------------------------------------  Cardiac Enzymes Recent Labs  Lab 10/09/18 0651  TROPONINI 0.05*   ------------------------------------------------------------------------------------------------------------------ Invalid input(s): POCBNP  ---------------------------------------------------------------------------------------------------------------  Urinalysis No results found for: COLORURINE, APPEARANCEUR, LABSPEC, PHURINE, GLUCOSEU, HGBUR, BILIRUBINUR, KETONESUR, PROTEINUR, UROBILINOGEN, NITRITE, LEUKOCYTESUR   RADIOLOGY: Dg Chest Portable 1 View  Result Date: 10/09/2018 CLINICAL DATA:  Acute onset of shortness of breath and anxiety. Decreased O2 saturation. Patient on dialysis. EXAM: PORTABLE CHEST 1 VIEW COMPARISON:  Chest radiograph performed 09/21/2018, and CT of the chest performed 09/26/2014 FINDINGS: The lungs are well-aerated. Vascular congestion is noted. Increased interstitial markings raise concern for pulmonary edema. Right lateral pleural thickening may reflect a small right pleural effusion. There is no evidence of pneumothorax. The cardiomediastinal silhouette is mildly enlarged. No acute osseous abnormalities are seen. IMPRESSION: Vascular congestion and mild cardiomegaly. Increased interstitial markings raise concern for pulmonary edema. Right lateral pleural thickening may reflect a small right pleural effusion. Electronically Signed   By: Garald Balding M.D.   On: 10/09/2018 06:57    EKG: Orders placed or performed during the hospital encounter of 10/09/18  . ED EKG within 10 minutes  . ED EKG within 10 minutes    IMPRESSION AND PLAN: *Acute sepsis Exact etiology unknown as source- ?  Right AKA stump  with noted serosanguineous drainage Sepsis protocol, empiric cefepime/Flagyl/vancomycin, follow-up on cultures  *Acute systolic congestive heart failure exacerbation/fluid overload state Secondary to end-stage renal disease Nephrology consulted for hemodialysis later today  *Acute hyperkalemia For hemodialysis later today  *Chronic end-stage renal disease Plan of care as stated above  *Acute recent right AKA For staple removal, vascular surgery to see if official consultation will be needed, check right lower extremity Doppler to evaluate for possible DVT  *Chronic paroxysmal A. Fib Continue aspirin, amiodarone  *Chronic diabetes mellitus type 2 Continue home regiment, sliding scale insulin with Accu-Cheks per routine   All the records are reviewed and case discussed with ED provider. Management plans discussed with the patient, family and they are in agreement.  CODE STATUS:full Code Status History    Date Active Date Inactive Code Status Order ID Comments User Context   09/21/2018 2044 09/28/2018 1748 Full Code [id number]  Gorden Harms, MD Inpatient   09/03/2018 2041 09/15/2018 2255 Full Code [id number]  Vaughan Basta, MD ED   08/12/2018 1629 08/14/2018 1324 Full Code [id number]  [name], MD ED   07/24/2018 1246 07/24/2018 1759 Full Code [id number]  [name], MD Inpatient   01/09/2017 1135 01/09/2017 1531 Full Code [id number]  [name], MD Inpatient   04/28/2016 1034 05/02/2016 2139 Full Code [id number]  Kasa,  Maretta Bees, [place] [facility]   04/25/2016 1340 04/28/2016 1034 Full Code [id number]  Schnier, Dolores Lory, MD Inpatient       TOTAL TIME TAKING CARE OF THIS PATIENT: 40 minutes.    Avel Peace Devin Ganaway M.D on 10/09/2018   Between 7am to 6pm - Pager - [phone]  After 6pm go to [email or web address] - password [email or web address] [facility]  Office  [phone]  CC: Primary care physician; [name], MD   Note: This dictation was prepared  with Dragon dictation along with smaller phrase technology. Any transcriptional errors that result from this process are unintentional.

## 2018-10-09 NOTE — ED Provider Notes (Signed)
[facility] Emergency Department Provider Note  Time seen: 7:36 AM  I have reviewed the triage vital signs and the nursing notes.   HISTORY  Chief Complaint Shortness of Breath    HPI [name] is a 57 y.o. male with a past medical history of diabetes, end-stage renal disease on hemodialysis, hypertension, hyperlipidemia, status post left BKA, recent right AKA last month, presents to the emergency department for cough, difficulty breathing and fever.  According to the patient has been coughing for several weeks, states he has had worsening shortness of breath over the past 2 to 3 days he is unable to lie down at night because of the shortness of breath.  Patient noted a fever last night to 100.1, febrile today to 102.6 in the emergency department.  Patient has a room air O2 saturation of 88% with no home O2 requirement.  Patient states they have been monitoring his wound of his right leg because of mild drainage from the AKA site.  Patient denies any vomiting or diarrhea.  States cough but denies sputum production.   Past Medical History:  Diagnosis Date  . (HFpEF) heart failure with preserved ejection fraction ([place])    a. 2017 EF per LHC 30-35%; b. 09/2018 Echo: EF 55-60%, no rwma, Gr2 DD, mild MR, mildly dil LA. PASP 59mmHg. small to mod circumferential pericardial eff, no tamponade.  . Anemia of chronic disease   . Atherosclerosis   . CAD (coronary artery disease)    a. 2017 LHC, severe 1v disease, occluded pRCA, L-R collaterals, moderate LV dysfunction EF 30-35.  . Diabetes mellitus with complication ([place])   . End stage renal disease on dialysis ([place])    Labile BP  . History of ventricular tachycardia    a. 2017 polymorphic VT and cardiac arrest.  . Hypercholesteremia   . Hyperlipidemia LDL goal <70   . Hypertension   . Labile blood pressure    on HD  . NSVT (nonsustained ventricular tachycardia) ([place])    08/2018 NSVT  . PAF (paroxysmal atrial  fibrillation) ([place])    a. 08/2018 CHA2DS2VASc at least 6-->initially placed on eliquis but d/c'd 2/2 anemia. On Amio.  Marland Kitchen Peripheral arterial occlusive disease (HCC)    s/p L BKA, R AKA  . S/P AKA (above knee amputation) unilateral, right ([place])    08/2018  . S/P BKA (below knee amputation) unilateral, left ([place])    2015  . Stroke [facility])     Patient Active Problem List   Diagnosis Date Noted  . Pressure injury of skin 09/26/2018  . Paroxysmal atrial fibrillation (HCC)   . Sepsis ([place]) 09/21/2018  . Status post above-knee amputation ([place]) 09/18/2018  . End stage renal disease on dialysis ([place])   . NSVT (nonsustained ventricular tachycardia) ([place])   . Atrial fibrillation with rapid ventricular response ([place])   . Demand ischemia ([place])   . Limb ischemia 09/03/2018  . Arterial occlusion 08/12/2018  . ESRD on dialysis ([place]) 11/09/2016  . Diabetes ([place]) 11/09/2016  . PVD (peripheral vascular disease) ([place]) 11/09/2016  . Cardiac arrest ([place]) 04/28/2016  . Atherosclerosis of extremity with gangrene ([place]) 04/25/2016  . Diabetic polyneuropathy associated with type 2 diabetes mellitus ([place]) 12/09/2015  . Diabetic ulcer of right foot associated with type 2 diabetes mellitus ([place]) 12/09/2015  . Anemia 03/24/2014  . Left foot burn 03/17/2014  . Leukocytosis 03/17/2014  . Obesity 03/17/2014  . PND (paroxysmal nocturnal dyspnea) 06/29/2013  . Volume overload 06/29/2013  . Diastolic  congestive heart failure, NYHA class 2 ([place]) 06/28/2013  . Shortness of breath 06/28/2013  . After-cataract with vision obscured 08/01/2011  . Anisometropia 08/01/2011  . Background diabetic retinopathy ([place]) 08/01/2011  . Senile nuclear sclerosis 08/01/2011    Past Surgical History:  Procedure Laterality Date  . A/V FISTULAGRAM Left 01/09/2017   Procedure: A/V Fistulagram;  Surgeon: [name], MD;  Location: [facility];  Service: Cardiovascular;  Laterality: Left;  . A/V FISTULAGRAM Left 06/06/2017    Procedure: A/V Fistulagram;  Surgeon: [name], MD;  Location: [place] [facility];  Service: Cardiovascular;  Laterality: Left;  . A/V FISTULAGRAM Left 08/15/2017   Procedure: A/V Fistulagram;  Surgeon: [name], MD;  Location: [place] [facility];  Service: Cardiovascular;  Laterality: Left;  . A/V FISTULAGRAM Left 01/22/2018   Procedure: A/V FISTULAGRAM;  Surgeon: [name], MD;  Location: [place] [facility];  Service: Cardiovascular;  Laterality: Left;  . A/V SHUNT INTERVENTION N/A 06/06/2017   Procedure: A/V SHUNT INTERVENTION;  Surgeon: [name], MD;  Location: [place] [facility];  Service: Cardiovascular;  Laterality: N/A;  . AMPUTATION Right 04/25/2016   Procedure: TOE AMPUTATION;  Surgeon: Katha Cabal, MD;  Location: [facility];  Service: Vascular;  Laterality: Right;  . AMPUTATION Right 09/09/2018   Procedure: AMPUTATION ABOVE KNEE;  Surgeon: [name], MD;  Location: [facility];  Service: Vascular;  Laterality: Right;  . APPLICATION OF WOUND VAC Right 04/25/2016   Procedure: APPLICATION OF WOUND VAC;  Surgeon: Katha Cabal, MD;  Location: [facility];  Service: Vascular;  Laterality: Right;  . AV FISTULA PLACEMENT Left 2015  . CARDIAC CATHETERIZATION N/A 04/30/2016   Procedure: Left Heart Cath and Coronary Angiography;  Surgeon: Wellington Hampshire, MD;  Location: [facility];  Service: Cardiovascular;  Laterality: N/A;  . CATARACT EXTRACTION, BILATERAL    . LEG AMPUTATION BELOW KNEE Left   . LOWER EXTREMITY ANGIOGRAPHY Right 07/24/2018   Procedure: LOWER EXTREMITY ANGIOGRAPHY;  Surgeon: [name], MD;  Location: [place] [facility];  Service: Cardiovascular;  Laterality: Right;  . LOWER EXTREMITY ANGIOGRAPHY Right 08/13/2018   Procedure: LOWER EXTREMITY ANGIOGRAPHY;  Surgeon: [name], MD;  Location: [place] [facility];  Service: Cardiovascular;  Laterality: Right;  . LOWER EXTREMITY ANGIOGRAPHY Right 09/04/2018   Procedure: Lower Extremity  Angiography;  Surgeon: [name], MD;  Location: [facility] [facility];  Service: Cardiovascular;  Laterality: Right;  . PERIPHERAL VASCULAR CATHETERIZATION N/A 10/03/2015   Procedure: A/V Shuntogram/Fistulagram;  Surgeon: [name], MD;  Location: [place] [facility];  Service: Cardiovascular;  Laterality: N/A;  . PERIPHERAL VASCULAR CATHETERIZATION N/A 01/16/2016   Procedure: Abdominal Aortogram w/Lower Extremity;  Surgeon: [name], MD;  Location: [facility];  Service: Cardiovascular;  Laterality: N/A;  . PERIPHERAL VASCULAR CATHETERIZATION  01/16/2016   Procedure: Lower Extremity Intervention;  Surgeon: [name], MD;  Location: [facility];  Service: Cardiovascular;;  . PERIPHERAL VASCULAR CATHETERIZATION Right 04/27/2016   Procedure: Lower Extremity Angiography;  Surgeon: Katha Cabal, MD;  Location: [place] [facility];  Service: Cardiovascular;  Laterality: Right;  . PERIPHERAL VASCULAR CATHETERIZATION  04/27/2016   Procedure: Lower Extremity Intervention;  Surgeon: Katha Cabal, MD;  Location: [place] [facility];  Service: Cardiovascular;;  . TRANSMETATARSAL AMPUTATION Right 04/28/2016   Procedure: TRANSMETATARSAL AMPUTATION;  Surgeon: Albertine Patricia, DPM;  Location: [facility];  Service:  Podiatry;  Laterality: Right;  . WOUND DEBRIDEMENT Right 04/25/2016   Procedure: DEBRIDEMENT WOUND;  Surgeon: Katha Cabal, MD;  Location: [facility];  Service: Vascular;  Laterality: Right;    Prior to Admission medications   Medication Sig Start Date End Date Taking? Authorizing Provider  acetaminophen (TYLENOL) 325 MG tablet Take 650 mg by mouth every 6 (six) hours as needed.   Yes [provider]  albuterol (ACCUNEB) 0.63 MG/3ML nebulizer solution Take 1 ampule by nebulization every 4 (four) hours as needed for wheezing.   Yes [provider]  aspirin EC 81 MG tablet Take 81 mg by mouth daily.   Yes [provider]  b complex-vitamin  c-folic acid (NEPHRO-VITE) 0.8 MG TABS tablet Take 1 tablet by mouth daily.   Yes [provider]  cinacalcet (SENSIPAR) 60 MG tablet Take 60 mg by mouth daily.    Yes [provider]  ferrous sulfate 325 (65 FE) MG EC tablet Take 325 mg by mouth 3 (three) times daily with meals.   Yes [provider]  glipiZIDE (GLUCOTROL) 10 MG tablet Take 10 mg by mouth daily.  10/25/16  Yes [provider]  lactulose (CHRONULAC) 10 GM/15ML solution Take 10 g by mouth daily.   Yes [provider]  midodrine (PROAMATINE) 10 MG tablet Take 1 tablet (10 mg total) by mouth 3 (three) times daily with meals. 09/28/18  Yes Mody, Ulice Bold, MD  omeprazole (PRILOSEC) 40 MG capsule Take 40 mg by mouth 2 (two) times daily.    Yes [provider]  polyethylene glycol (MIRALAX / GLYCOLAX) packet Take 17 g by mouth 2 (two) times daily.    Yes [provider]  senna-docusate (SENOKOT-S) 8.6-50 MG tablet Take 1 tablet by mouth 2 (two) times daily.   Yes [provider]  sevelamer carbonate (RENVELA) 800 MG tablet Take 2,400 mg by mouth 3 (three) times daily with meals.    Yes [provider]  Amino Acids-Protein Hydrolys (FEEDING SUPPLEMENT, PRO-STAT SUGAR FREE 64,) LIQD Take 30 mLs by mouth daily.    [provider]  amiodarone (PACERONE) 200 MG tablet Take 1 tablet (200 mg total) by mouth 2 (two) times daily. Patient not taking: Reported on 10/09/2018 10/02/18   Theora Gianotti, NP  Hydrocortisone (GERHARDT'S BUTT CREAM) CREA Apply 1 application topically 4 (four) times daily. Patient not taking: Reported on 10/09/2018 09/28/18   Bettey Costa, MD  lidocaine-prilocaine (EMLA) cream Apply 1 application topically as needed (port access).    [provider]  Nutritional Supplements (FEEDING SUPPLEMENT, NEPRO CARB STEADY,) LIQD Take 237 mLs by mouth 2 (two) times daily between meals. 09/28/18   Bettey Costa, MD  oxyCODONE (OXY  IR/ROXICODONE) 5 MG immediate release tablet Take 1 tablet (5 mg total) by mouth every 6 (six) hours as needed for moderate pain or severe pain. 09/28/18   Bettey Costa, MD    No Known Allergies  Family History  Problem Relation Age of Onset  . Diabetes Father   . Prostate cancer Father     Social History Social History   Tobacco Use  . Smoking status: Never Smoker  . Smokeless tobacco: Never Used  Substance Use Topics  . Alcohol use: No  . Drug use: No    Review of Systems Constitutional: Positive for fever Cardiovascular: Negative for chest pain. Respiratory: Positive for shortness of breath.  Positive for cough. Gastrointestinal: Negative for abdominal pain, vomiting Musculoskeletal: Negative for musculoskeletal complaints Skin: Negative for  skin complaints  Neurological: Negative for headache All other ROS negative  ____________________________________________   PHYSICAL EXAM:  VITAL SIGNS: ED Triage Vitals [10/09/18 0638]  Enc Vitals Group     BP (!) 157/107     Pulse Rate (!) 101     Resp (!) 25     Temp (!) 102.6 F (39.2 C)     Temp Source Oral     SpO2 (!) 88 %     Weight      Height      Head Circumference      Peak Flow      Pain Score      Pain Loc      Pain Edu?      Excl. in [place]?    Constitutional: Alert and oriented. Well appearing and in no distress. Eyes: Normal exam ENT   Head: Normocephalic and atraumatic.   Mouth/Throat: Mucous membranes are moist. Cardiovascular: Normal rate, regular rhythm. No murmur Respiratory: Normal respiratory effort without tachypnea nor retractions. Breath sounds are clear Gastrointestinal: Soft and nontender. No distention.  Musculoskeletal: Recent right AKA, incision appears to be healing, staples in place, mild tenderness along the medial aspect of the incision but no dehiscence noted.  Minimal drainage.  Status post left BKA. Neurologic:  Normal speech and language. No gross focal neurologic  deficits Skin:  Skin is warm.  Does have a recent incision to the right AKA stump, minimal drainage from the medial aspect with mild tenderness to this area.  No dehiscence, staples intact. Psychiatric: Mood and affect are normal.   ____________________________________________    EKG  EKG viewed and interpreted by myself shows a normal sinus rhythm at 99 bpm, slightly widened QRS normal axis, nonspecific ST changes.  ____________________________________________    RADIOLOGY  Chest x-ray shows interstitial markings and vascular congestion concerning for pulmonary edema  ____________________________________________   INITIAL IMPRESSION / ASSESSMENT AND PLAN / ED COURSE  Pertinent labs & imaging results that were available during my care of the patient were reviewed by me and considered in my medical decision making (see chart for details).  Patient presents to the emergency department with cough, shortness of breath, found to be febrile to 102.6 and hypoxic to 88%.  Differential would include pulmonary edema, influenza, pneumonia, pneumonitis, URI, viral infection, possible stump infection.  Given the patient's tachycardia tachypnea hypoxia and fever to 102.6 we will start the patient on IV antibiotics via sepsis protocols.  Patient's chest x-ray most consistent with pulmonary edema however the patient also has a white blood cell count of 19.5, BNP is elevated which again would support pulmonary edema.  Patient is dialysis dependent we will discuss with nephrology to arrange for dialysis.  Patient meets sepsis criteria will be admitted to the hospital for continued IV antibiotics.  Influenza test is pending.  Plans are negative.  Patient will be admitted to the hospital service for continued treatment.  I discussed the patient with Dr. Holley Raring of nephrology who will arrange for dialysis for the patient.  Patient and wife agreeable.  CRITICAL CARE Performed by: Harvest Dark   Total critical care time: 30 minutes  Critical care time was exclusive of separately billable procedures and treating other patients.  Critical care was necessary to treat or prevent imminent or life-threatening deterioration.  Critical care was time spent personally by me on the following activities: development of treatment plan with patient and/or surrogate as well as nursing, discussions with consultants, evaluation of patient's response to  treatment, examination of patient, obtaining history from patient or surrogate, ordering and performing treatments and interventions, ordering and review of laboratory studies, ordering and review of radiographic studies, pulse oximetry and re-evaluation of patient's condition.   ____________________________________________   FINAL CLINICAL IMPRESSION(S) / ED DIAGNOSES  Fever Sepsis Pulmonary edema Fluid overload Hyperkalemia    Harvest Dark, MD 10/09/18 [phone]

## 2018-10-09 NOTE — Progress Notes (Signed)
Pts wife reported that flagyl was not a good antibiotic and its intended for animals based on what she found on the internet and that she wants the pt to have vancomycin. Reported to Dr. Darvin Neighbours on call provider. Medication was not to be discontinued. RN explained to the pt and his wife that the medication would not be discontinued and that it is an antibiotic intended to treat infection. Pt and wife agreed to let it finish infusing but that they feel vanc is better. RN explained that pt will be getting cefepime and flagyl scheduled and that vanc will be given in dialysis.

## 2018-10-09 NOTE — ED Notes (Signed)
Portable XR at bedside

## 2018-10-09 NOTE — Progress Notes (Signed)
[facility]  ROUNDING NOTE   Subjective:  Patient well-known to [place] from prior admission. He presents now with fevers and drainage from right AKA stump which was performed in November of 2019. Also has significant shortness of breath andhe is due for dialysis today.   Objective:  Vital signs in last 24 hours:  Temp:  [98.7 F (37.1 C)-102.6 F (39.2 C)] 98.7 F (37.1 C) (12/26 1137) Pulse Rate:  [81-101] 81 (12/26 1400) Resp:  [16-32] 19 (12/26 1400) BP: (132-162)/(48-120) 162/92 (12/26 1400) SpO2:  [88 %-100 %] 97 % (12/26 1400) Weight:  [114.7 kg] 114.7 kg (12/26 1137)  Weight change:  Filed Weights   10/09/18 1137  Weight: 114.7 kg    Intake/Output: No intake/output data recorded.   Intake/Output this shift:  Total I/O In: 400 [IV Piggyback:400] Out: -   Physical Exam: General: No acute distress  Head: Normocephalic, atraumatic. Moist oral mucosal membranes  Eyes: Anicteric  Neck: Supple, trachea midline  Lungs:  Bilateral rales, normal effort  Heart: S1S2 no rubs  Abdomen:  Soft, nontender, bowel sounds present  Extremities: B/L LE amputations  Neurologic: Awake, alert, following commands  Skin: No lesions  Access: LUE AVF    Basic Metabolic Panel: Recent Labs  Lab 10/09/18 0651  NA 135  K 5.8*  CL 93*  CO2 25  GLUCOSE 247*  BUN 76*  CREATININE 8.40*  CALCIUM 8.8*    Liver Function Tests: No results for input(s): AST, ALT, ALKPHOS, BILITOT, PROT, ALBUMIN in the last 168 hours. No results for input(s): LIPASE, AMYLASE in the last 168 hours. No results for input(s): AMMONIA in the last 168 hours.  CBC: Recent Labs  Lab 10/09/18 0651  WBC 19.5*  HGB 9.2*  HCT 31.0*  MCV 96.6  PLT 469*    Cardiac Enzymes: Recent Labs  Lab 10/09/18 0651  TROPONINI 0.05*    BNP: Invalid input(s): POCBNP  CBG: No results for input(s): GLUCAP in the last 168 hours.  Microbiology: Results for orders placed or performed during the  hospital encounter of 09/21/18  Blood Culture (routine x 2)     Status: None   Collection Time: 09/21/18  3:00 PM  Result Value Ref Range Status   Specimen Description BLOOD R AC  Final   Special Requests   Final    BOTTLES DRAWN AEROBIC AND ANAEROBIC Blood Culture results may not be optimal due to an excessive volume of blood received in culture bottles   Culture   Final    NO GROWTH 5 DAYS Performed at [facility], [address] [place], [place] [address]    Report Status 09/26/2018 FINAL  Final  Blood Culture (routine x 2)     Status: None   Collection Time: 09/21/18  3:00 PM  Result Value Ref Range Status   Specimen Description BLOOD R HAND  Final   Special Requests   Final    BOTTLES DRAWN AEROBIC AND ANAEROBIC Blood Culture adequate volume   Culture   Final    NO GROWTH 5 DAYS Performed at [facility], [address], [place], [place] [address]    Report Status 09/26/2018 FINAL  Final  MRSA PCR Screening     Status: None   Collection Time: 09/21/18 11:36 PM  Result Value Ref Range Status   MRSA by PCR NEGATIVE NEGATIVE Final    Comment:        The GeneXpert MRSA Assay (FDA approved for NASAL specimens only), is one  component of a comprehensive MRSA colonization surveillance program. It is not intended to diagnose MRSA infection nor to guide or monitor treatment for MRSA infections. Performed at [facility], [address] [place], [place] [address]     Coagulation Studies: No results for input(s): LABPROT, INR in the last 72 hours.  Urinalysis: No results for input(s): COLORURINE, LABSPEC, PHURINE, GLUCOSEU, HGBUR, BILIRUBINUR, KETONESUR, PROTEINUR, UROBILINOGEN, NITRITE, LEUKOCYTESUR in the last 72 hours.  Invalid input(s): APPERANCEUR    Imaging: Dg Chest Portable 1 View  Result Date: 10/09/2018 CLINICAL DATA:  Acute onset of shortness of breath and anxiety. Decreased O2 saturation. Patient on dialysis. EXAM: PORTABLE  CHEST 1 VIEW COMPARISON:  Chest radiograph performed 09/21/2018, and CT of the chest performed 09/26/2014 FINDINGS: The lungs are well-aerated. Vascular congestion is noted. Increased interstitial markings raise concern for pulmonary edema. Right lateral pleural thickening may reflect a small right pleural effusion. There is no evidence of pneumothorax. The cardiomediastinal silhouette is mildly enlarged. No acute osseous abnormalities are seen. IMPRESSION: Vascular congestion and mild cardiomegaly. Increased interstitial markings raise concern for pulmonary edema. Right lateral pleural thickening may reflect a small right pleural effusion. Electronically Signed   By: Garald Balding M.D.   On: 10/09/2018 06:57     Medications:   . [START ON 10/10/2018] ceFEPime (MAXIPIME) IV    . metronidazole Stopped (10/09/18 0940)  . vancomycin    . [START ON 10/10/2018] vancomycin 1,000 mg (10/09/18 1039)   . Chlorhexidine Gluconate Cloth  6 each Topical Q0600     Assessment/ Plan:  57 y.o. male Hispanic (Spanish speaking only) malewith end stage renal disease, peripheral vascular disease, hypertension, CVA, hyperlipidemia, diabetes mellitus type II, anemia of CKD, SHPTH, s/p b/l LE amputations  MWF [facility] EDW 106.5 kg Left AVF  1.  ESRD on HD MWF.  Patient due for dialysis today.  We'll pursue been repaired given the fact that he is short of breath.  2.  Hyperkalemia.  Serum potassium noted to be 5.8.  We will treat the patient with a 2K bath.  3.  Anemia chronic kidney disease.  Hemoglobin currently 9.2.  Start the patient on Epogen 4000 units IV with dialysis.  4.  Secondary hyperparathyroidism.  Phosphorus currently 3.0 and acceptable.  5.  Thanks for consultation.    LOS: 0 Yareliz Thorstenson 12/26/20192:11 PM

## 2018-10-09 NOTE — Progress Notes (Signed)
HD tx start    10/09/18 1147  Vital Signs  Pulse Rate 90  Pulse Rate Source Monitor  Resp 20  BP (!) 142/76  BP Location Right Arm  BP Method Automatic  Patient Position (if appropriate) Lying  Oxygen Therapy  SpO2 94 %  O2 Device Nasal Cannula  O2 Flow Rate (L/min) 2 L/min  During Hemodialysis Assessment  Blood Flow Rate (mL/min) 400 mL/min  Arterial Pressure (mmHg) -160 mmHg  Venous Pressure (mmHg) 170 mmHg  Transmembrane Pressure (mmHg) 60 mmHg  Ultrafiltration Rate (mL/min) 710 mL/min  Dialysate Flow Rate (mL/min) 800 ml/min  Conductivity: Machine  14.2  HD Safety Checks Performed Yes  Dialysis Fluid Bolus Normal Saline  Bolus Amount (mL) 250 mL  Intra-Hemodialysis Comments Tx initiated  Fistula / Graft Left Forearm Arteriovenous fistula  No Placement Date or Time found.   Placed prior to admission: Yes  Orientation: Left  Access Location: Forearm  Access Type: Arteriovenous fistula  Status Accessed  Needle Size 15

## 2018-10-09 NOTE — Progress Notes (Signed)
Post HD assessment    10/09/18 1533  Neurological  Level of Consciousness Alert  Orientation Level Oriented X4  Respiratory  Respiratory Pattern Regular;Unlabored  Chest Assessment Chest expansion symmetrical  Cardiac  Pulse Irregular  ECG Monitor Yes  Vascular  R Radial Pulse +2  L Radial Pulse +2  Edema Generalized  Integumentary  Integumentary (WDL) X  Skin Color Appropriate for ethnicity  Musculoskeletal  Musculoskeletal (WDL) X  Generalized Weakness Yes  Assistive Device None  GU Assessment  Genitourinary (WDL) X  Genitourinary Symptoms  (HD)  Psychosocial  Psychosocial (WDL) WDL

## 2018-10-09 NOTE — ED Notes (Signed)
To dialysis.  AAOx3

## 2018-10-09 NOTE — Consult Note (Signed)
Reason for Consult:RIGHT AKA incision Referring Physician: Dr. Fanny Dance is an 57 y.o. male.  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 Presents with shortness of breath, cough, fever, evidence of septic process. Has serosang drainage from stump, notes mild pain.  Past Medical History:  Diagnosis Date  . (HFpEF) heart failure with preserved ejection fraction ([name])    a. 2017 EF per LHC 30-35%; b. 09/2018 Echo: EF 55-60%, no rwma, Gr2 DD, mild MR, mildly dil LA. PASP 82mmHg. small to mod circumferential pericardial eff, no tamponade.  . Anemia of chronic disease   . Atherosclerosis   . CAD (coronary artery disease)    a. 2017 LHC, severe 1v disease, occluded pRCA, L-R collaterals, moderate LV dysfunction EF 30-35.  . Diabetes mellitus with complication ([place])   . End stage renal disease on dialysis ([place])    Labile BP  . History of ventricular tachycardia    a. 2017 polymorphic VT and cardiac arrest.  . Hypercholesteremia   . Hyperlipidemia LDL goal <70   . Hypertension   . Labile blood pressure    on HD  . NSVT (nonsustained ventricular tachycardia) ([place])    08/2018 NSVT  . PAF (paroxysmal atrial fibrillation) ([place])    a. 08/2018 CHA2DS2VASc at least 6-->initially placed on eliquis but d/c'd 2/2 anemia. On Amio.  Marland Kitchen Peripheral arterial occlusive disease (HCC)    s/p L BKA, R AKA  . S/P AKA (above knee amputation) unilateral, right ([name])    08/2018  . S/P BKA (below knee amputation) unilateral, left ([place])    2015  . Stroke [facility])     Past Surgical History:  Procedure Laterality Date  . A/V FISTULAGRAM Left 01/09/2017   Procedure: A/V Fistulagram;  Surgeon: [name], MD;  Location: [place] CV LAB;  Service: Cardiovascular;  Laterality: Left;  . A/V FISTULAGRAM Left 06/06/2017   Procedure: A/V Fistulagram;  Surgeon: [name], MD;  Location: [place] CV LAB;  Service: Cardiovascular;  Laterality: Left;  . A/V FISTULAGRAM  Left 08/15/2017   Procedure: A/V Fistulagram;  Surgeon: [name], MD;  Location: [place] CV LAB;  Service: Cardiovascular;  Laterality: Left;  . A/V FISTULAGRAM Left 01/22/2018   Procedure: A/V FISTULAGRAM;  Surgeon: [name], MD;  Location: [place] CV LAB;  Service: Cardiovascular;  Laterality: Left;  . A/V SHUNT INTERVENTION N/A 06/06/2017   Procedure: A/V SHUNT INTERVENTION;  Surgeon: [name], MD;  Location: [place] CV LAB;  Service: Cardiovascular;  Laterality: N/A;  . AMPUTATION Right 04/25/2016   Procedure: TOE AMPUTATION;  Surgeon: Katha Cabal, MD;  Location: [facility] ORS;  Service: Vascular;  Laterality: Right;  . AMPUTATION Right 09/09/2018   Procedure: AMPUTATION ABOVE KNEE;  Surgeon: [name], MD;  Location: [facility] ORS;  Service: Vascular;  Laterality: Right;  . APPLICATION OF WOUND VAC Right 04/25/2016   Procedure: APPLICATION OF WOUND VAC;  Surgeon: Katha Cabal, MD;  Location: [facility] ORS;  Service: Vascular;  Laterality: Right;  . AV FISTULA PLACEMENT Left 2015  . CARDIAC CATHETERIZATION N/A 04/30/2016   Procedure: Left Heart Cath and Coronary Angiography;  Surgeon: Wellington Hampshire, MD;  Location: [place] CV LAB;  Service: Cardiovascular;  Laterality: N/A;  . CATARACT EXTRACTION, BILATERAL    . LEG AMPUTATION BELOW KNEE Left   . LOWER EXTREMITY ANGIOGRAPHY Right 07/24/2018   Procedure: LOWER EXTREMITY ANGIOGRAPHY;  Surgeon: [name], MD;  Location: [facility]  INVASIVE CV LAB;  Service: Cardiovascular;  Laterality: Right;  . LOWER EXTREMITY ANGIOGRAPHY Right 08/13/2018   Procedure: LOWER EXTREMITY ANGIOGRAPHY;  Surgeon: [name], MD;  Location: [place] CV LAB;  Service: Cardiovascular;  Laterality: Right;  . LOWER EXTREMITY ANGIOGRAPHY Right 09/04/2018   Procedure: Lower Extremity Angiography;  Surgeon: [name], MD;  Location: [place] CV LAB;  Service: Cardiovascular;  Laterality: Right;  . PERIPHERAL VASCULAR  CATHETERIZATION N/A 10/03/2015   Procedure: A/V Shuntogram/Fistulagram;  Surgeon: [name], MD;  Location: [place] CV LAB;  Service: Cardiovascular;  Laterality: N/A;  . PERIPHERAL VASCULAR CATHETERIZATION N/A 01/16/2016   Procedure: Abdominal Aortogram w/Lower Extremity;  Surgeon: [name], MD;  Location: [place] CV LAB;  Service: Cardiovascular;  Laterality: N/A;  . PERIPHERAL VASCULAR CATHETERIZATION  01/16/2016   Procedure: Lower Extremity Intervention;  Surgeon: [name], MD;  Location: [place] CV LAB;  Service: Cardiovascular;;  . PERIPHERAL VASCULAR CATHETERIZATION Right 04/27/2016   Procedure: Lower Extremity Angiography;  Surgeon: Katha Cabal, MD;  Location: [place] CV LAB;  Service: Cardiovascular;  Laterality: Right;  . PERIPHERAL VASCULAR CATHETERIZATION  04/27/2016   Procedure: Lower Extremity Intervention;  Surgeon: Katha Cabal, MD;  Location: [place] CV LAB;  Service: Cardiovascular;;  . TRANSMETATARSAL AMPUTATION Right 04/28/2016   Procedure: TRANSMETATARSAL AMPUTATION;  Surgeon: Albertine Patricia, DPM;  Location: [facility] ORS;  Service: Podiatry;  Laterality: Right;  . WOUND DEBRIDEMENT Right 04/25/2016   Procedure: DEBRIDEMENT WOUND;  Surgeon: Katha Cabal, MD;  Location: [facility] ORS;  Service: Vascular;  Laterality: Right;    Family History  Problem Relation Age of Onset  . Diabetes Father   . Prostate cancer Father     Social History:  reports that he has never smoked. He has never used smokeless tobacco. He reports that he does not drink alcohol or use drugs.  Allergies: No Known Allergies  Medications: I have reviewed the patient's current medications.  Results for orders placed or performed during the hospital encounter of 10/09/18 (from the past 48 hour(s))  Brain natriuretic peptide     Status: Abnormal   Collection Time: 10/09/18  6:37 AM  Result Value Ref Range   B Natriuretic Peptide 3,708.0 (H) 0.0 - 100.0 pg/mL     Comment: Performed at [facility], [address], [place], [place] [address]  Influenza panel by PCR (type A & B)     Status: None   Collection Time: 10/09/18  6:41 AM  Result Value Ref Range   Influenza A By PCR NEGATIVE NEGATIVE   Influenza B By PCR NEGATIVE NEGATIVE    Comment: (NOTE) The Xpert Xpress Flu assay is intended as an aid in the diagnosis of  influenza and should not be used as a sole basis for treatment.  This  assay is FDA approved for nasopharyngeal swab specimens only. Nasal  washings and aspirates are unacceptable for Xpert Xpress Flu testing. Performed at [facility], [address] [place],  [address]   Basic metabolic panel     Status: Abnormal   Collection Time: 10/09/18  6:51 AM  Result Value Ref Range   Sodium 135 135 - 145 mmol/L   Potassium 5.8 (H) 3.5 - 5.1 mmol/L   Chloride 93 (L) 98 - 111 mmol/L   CO2 25 22 - 32 mmol/L   Glucose, Bld 247 (H) 70 - 99 mg/dL   BUN 76 (H) 6 - 20 mg/dL   Creatinine,  Ser 8.40 (H) 0.61 - 1.24 mg/dL   Calcium 8.8 (L) 8.9 - 10.3 mg/dL   GFR calc non Af Amer 6 (L) >60 mL/min   GFR calc Af Amer 7 (L) >60 mL/min   Anion gap 17 (H) 5 - 15    Comment: Performed at [facility], [address] [place], [place] [address]  CBC     Status: Abnormal   Collection Time: 10/09/18  6:51 AM  Result Value Ref Range   WBC 19.5 (H) 4.0 - 10.5 K/uL   RBC 3.21 (L) 4.22 - 5.81 MIL/uL   Hemoglobin 9.2 (L) 13.0 - 17.0 g/dL   HCT 31.0 (L) 39.0 - 52.0 %   MCV 96.6 80.0 - 100.0 fL   MCH 28.7 26.0 - 34.0 pg   MCHC 29.7 (L) 30.0 - 36.0 g/dL   RDW 17.1 (H) 11.5 - 15.5 %   Platelets 469 (H) 150 - 400 K/uL   nRBC 0.0 0.0 - 0.2 %    Comment: Performed at [facility], [address] [place], [place] [address]  Troponin I - ONCE - STAT     Status: Abnormal   Collection Time: 10/09/18  6:51 AM  Result Value Ref Range   Troponin I 0.05 (HH) <0.03 ng/mL    Comment: CRITICAL RESULT CALLED TO, READ  BACK BY AND VERIFIED WITH Gwynn Burly AT 0745 10/09/18 DAS Performed at [facility], [address] [place], [place] [address]   CG4 I-STAT (Lactic acid)     Status: None   Collection Time: 10/09/18  7:05 AM  Result Value Ref Range   Lactic Acid, Venous 1.37 0.5 - 1.9 mmol/L    Dg Chest Portable 1 View  Result Date: 10/09/2018 CLINICAL DATA:  Acute onset of shortness of breath and anxiety. Decreased O2 saturation. Patient on dialysis. EXAM: PORTABLE CHEST 1 VIEW COMPARISON:  Chest radiograph performed 09/21/2018, and CT of the chest performed 09/26/2014 FINDINGS: The lungs are well-aerated. Vascular congestion is noted. Increased interstitial markings raise concern for pulmonary edema. Right lateral pleural thickening may reflect a small right pleural effusion. There is no evidence of pneumothorax. The cardiomediastinal silhouette is mildly enlarged. No acute osseous abnormalities are seen. IMPRESSION: Vascular congestion and mild cardiomegaly. Increased interstitial markings raise concern for pulmonary edema. Right lateral pleural thickening may reflect a small right pleural effusion. Electronically Signed   By: Garald Balding M.D.   On: 10/09/2018 06:57    Review of Systems  Constitutional: Positive for chills, fever and malaise/fatigue.  Respiratory: Positive for cough and shortness of breath.   Cardiovascular: Negative for chest pain.  Gastrointestinal: Negative for abdominal pain, diarrhea and vomiting.  Genitourinary: Negative.   Neurological: Negative for dizziness and headaches.   Blood pressure (!) 145/58, pulse 96, temperature 99.1 F (37.3 C), temperature source Oral, resp. rate 20, SpO2 (!) 89 %. Physical Exam  Nursing note and vitals reviewed. Constitutional: He is oriented to person, place, and time. He appears well-developed and well-nourished.  Neck: Normal range of motion.  Cardiovascular: Normal rate.  Respiratory: He has wheezes.  GI: Soft. He exhibits no  distension. There is no abdominal tenderness.  Musculoskeletal:        General: Tenderness present.     Comments: RIGHT AKA- serosang drainage from incision, staples in place with incisional line necrosis, erythema.  Neurological: He is alert and oriented to person, place, and time.  Skin: Skin is warm. There is erythema.    Assessment/Plan: S/P RIGHT  AKA 08/2018 Will remove staples; local wound care. Monitor stump.  ,  A 10/09/2018, 10:39 AM

## 2018-10-09 NOTE — Progress Notes (Signed)
Post HD assessment. Pt tolerated tx well without c/o or complication. Net UF 2517, goal met.    10/09/18 1535  Vital Signs  Temp 98.8 F (37.1 C)  Temp Source Oral  Pulse Rate 82  Pulse Rate Source Monitor  Resp 18  BP (!) 165/91  BP Location Right Arm  BP Method Automatic  Patient Position (if appropriate) Lying  Oxygen Therapy  SpO2 100 %  O2 Device Nasal Cannula  O2 Flow Rate (L/min) 3 L/min  Dialysis Weight  Weight 112.2 kg  Type of Weight Post-Dialysis  Post-Hemodialysis Assessment  Rinseback Volume (mL) 250 mL  KECN 78.3 V  Dialyzer Clearance Lightly streaked  Duration of HD Treatment -hour(s) 3.5 hour(s)  Hemodialysis Intake (mL) 500 mL  UF Total -Machine (mL) 3017 mL  Net UF (mL) 2517 mL  Tolerated HD Treatment Yes  AVG/AVF Arterial Site Held (minutes) 10 minutes  AVG/AVF Venous Site Held (minutes) 10 minutes  Education / Care Plan  Dialysis Education Provided Yes  Documented Education in Care Plan Yes  Fistula / Graft Left Forearm Arteriovenous fistula  No Placement Date or Time found.   Placed prior to admission: Yes  Orientation: Left  Access Location: Forearm  Access Type: Arteriovenous fistula  Site Condition No complications  Fistula / Graft Assessment Present;Thrill;Bruit  Status Deaccessed  Drainage Description None

## 2018-10-09 NOTE — Progress Notes (Signed)
HD tx end    10/09/18 1528  Vital Signs  Pulse Rate 81  Pulse Rate Source Monitor  Resp 18  BP (!) 165/88  BP Location Right Arm  BP Method Automatic  Patient Position (if appropriate) Lying  Oxygen Therapy  SpO2 100 %  O2 Device Nasal Cannula  O2 Flow Rate (L/min) 3 L/min  During Hemodialysis Assessment  Dialysis Fluid Bolus Normal Saline  Bolus Amount (mL) 250 mL  Intra-Hemodialysis Comments Tx completed

## 2018-10-09 NOTE — Progress Notes (Signed)
Family Meeting Note  Advance Directive:yes  Today a meeting took place with the Patient, family.  Patient is able to participate   The following clinical team members were present during this meeting:MD  The following were discussed:Patient's diagnosis: esrd, chf, sepsis, Patient's progosis: Unable to determine and Goals for treatment: Full Code  Additional follow-up to be provided: prn  Time spent during discussion:20 minutes  Gorden Harms, MD

## 2018-10-09 NOTE — ED Triage Notes (Signed)
Pt arrived to ED via EMS from [facility] with c/o SOB and anxiousness. Pt due for dialysis this AM. O2 on arrival 88% RA, placed on 2L. Pt received 1 albuterol nebulizer in route.

## 2018-10-09 NOTE — Progress Notes (Signed)
Pre HD assessment    10/09/18 1137  Vital Signs  Temp 98.7 F (37.1 C)  Temp Source Oral  Pulse Rate 91  Pulse Rate Source Monitor  Resp 19  BP 140/75  BP Location Right Arm  BP Method Automatic  Patient Position (if appropriate) Sitting  Oxygen Therapy  SpO2 94 %  O2 Device Nasal Cannula  O2 Flow Rate (L/min) 2 L/min  Pain Assessment  Pain Scale 0-10  Pain Score 0  Dialysis Weight  Weight 114.7 kg  Type of Weight Pre-Dialysis  Time-Out for Hemodialysis  What Procedure? HD  Pt Identifiers(min of two) First/Last Name;MRN/Account#  Correct Site? Yes  Correct Side? Yes  Correct Procedure? Yes  Consents Verified? Yes  Rad Studies Available? N/A  Safety Precautions Reviewed? Yes  Engineer, civil (consulting) Number  ([id number])  Station Number 4  UF/Alarm Test Passed  Conductivity: Meter 14.2  Conductivity: Machine  14.2  pH 7.4  Reverse Osmosis main  Normal Saline Lot Number [id number]  Dialyzer Lot Number [id number]  Disposable Set Lot Number [id number]  Machine Temperature 98.6 F (37 C)  Musician and Audible Yes  Blood Lines Intact and Secured Yes  Pre Treatment Patient Checks  Vascular access used during treatment Fistula  Hepatitis B Surface Antigen Results Negative  Date Hepatitis B Surface Antigen Drawn 08/13/18  Hepatitis B Surface Antibody  (>10)  Date Hepatitis B Surface Antibody Drawn 08/13/18  Hemodialysis Consent Verified Yes  Hemodialysis Standing Orders Initiated Yes  ECG (Telemetry) Monitor On Yes  Prime Ordered Normal Saline  Length of  DialysisTreatment -hour(s) 3.5 Hour(s)  Dialyzer Elisio 17H NR  Dialysate 2K, 2.5 Ca  Dialysis Anticoagulant None  Dialysate Flow Ordered 800  Blood Flow Rate Ordered 400 mL/min  Ultrafiltration Goal 2 Liters  Pre Treatment Labs Phosphorus (PTH)  Dialysis Blood Pressure Support Ordered Normal Saline  Education / Care Plan  Dialysis Education Provided Yes  Documented Education in Care Plan Yes  Fistula / Graft  Left Forearm Arteriovenous fistula  No Placement Date or Time found.   Placed prior to admission: Yes  Orientation: Left  Access Location: Forearm  Access Type: Arteriovenous fistula  Site Condition No complications  Fistula / Graft Assessment Present;Thrill;Bruit  Drainage Description None

## 2018-10-09 NOTE — Consult Note (Signed)
Pharmacy Antibiotic Note  [name] is a 57 y.o. male admitted on 10/09/2018 with sepsis.  Pharmacy has been consulted for Cefepime and Vancomycin dosing. Patient is also ordered Flagyl 500mg  IV. Vancomycin 1g and Cefepime 2g ordered and given in ED.   Patient has PMH of ESRD w/HD  MWF.   Plan: Will order vancomycin 1g IV x 1 dose for total of vancomycin 2000mg  IV LD (DW: 80kg). Plan for Vancomycin 1000mg  MWF with HD. Will need to follow HD schedule and order vancomycin doses if extra HD sessions are planned.  Will draw vancomycin level prior to 3rd HD session.  Target Pre-HD level 15-25 mcg/mL.   Start Cefepime 1gm IV every 24 hours.      Temp (24hrs), Avg:100.9 F (38.3 C), Min:99.1 F (37.3 C), Max:102.6 F (39.2 C)  Recent Labs  Lab 10/09/18 0651 10/09/18 0705  WBC 19.5*  --   CREATININE 8.40*  --   LATICACIDVEN  --  1.37    Estimated Creatinine Clearance: 10.6 mL/min (A) (by C-G formula based on SCr of 8.4 mg/dL (H)).    No Known Allergies  Antimicrobials this admission: 12/26 Vancomycin >>  12/26 Cefepime  >>  12/26 Flagyl >>   Dose adjustments this admission:   Microbiology results: 12/26 BCx: pending 12/26 MRSA PCR: pending  Thank you for allowing pharmacy to be a part of this patient's care.  Pernell Dupre, PharmD, BCPS Clinical Pharmacist 10/09/2018 10:04 AM

## 2018-10-09 NOTE — Progress Notes (Signed)
Pre HD assessment    10/09/18 1138  Neurological  Level of Consciousness Alert  Orientation Level Oriented X4  Respiratory  Respiratory Pattern Labored  Chest Assessment Chest expansion symmetrical  Cardiac  Pulse Irregular  ECG Monitor Yes  Cardiac Rhythm ST;NSR  Vascular  R Radial Pulse +2  L Radial Pulse +2  Edema Generalized  Integumentary  Integumentary (WDL) X  Skin Color Appropriate for ethnicity  Musculoskeletal  Musculoskeletal (WDL) X  Generalized Weakness Yes  Assistive Device None  GU Assessment  Genitourinary (WDL) X  Genitourinary Symptoms  (HD)  Psychosocial  Psychosocial (WDL) WDL

## 2018-10-10 DIAGNOSIS — I4819 Other persistent atrial fibrillation: Secondary | ICD-10-CM

## 2018-10-10 LAB — BASIC METABOLIC PANEL
Anion gap: 14 (ref 5–15)
BUN: 50 mg/dL — ABNORMAL HIGH (ref 6–20)
CO2: 27 mmol/L (ref 22–32)
Calcium: 9.1 mg/dL (ref 8.9–10.3)
Chloride: 98 mmol/L (ref 98–111)
Creatinine, Ser: 5.57 mg/dL — ABNORMAL HIGH (ref 0.61–1.24)
GFR calc non Af Amer: 10 mL/min — ABNORMAL LOW (ref >60)
GFR, EST AFRICAN AMERICAN: 12 mL/min — AB (ref >60)
Glucose, Bld: 197 mg/dL — ABNORMAL HIGH (ref 70–99)
Potassium: 4.4 mmol/L (ref 3.5–5.1)
Sodium: 139 mmol/L (ref 135–145)

## 2018-10-10 LAB — CBC WITH DIFFERENTIAL/PLATELET
Abs Immature Granulocytes: 0.07 10*3/uL (ref 0.00–0.07)
BASOS ABS: 0.1 10*3/uL (ref 0.0–0.1)
Basophils Relative: 1 %
Eosinophils Absolute: 0.2 10*3/uL (ref 0.0–0.5)
Eosinophils Relative: 2 %
HCT: 30.7 % — ABNORMAL LOW (ref 39.0–52.0)
Hemoglobin: 8.9 g/dL — ABNORMAL LOW (ref 13.0–17.0)
Immature Granulocytes: 1 %
Lymphocytes Relative: 8 %
Lymphs Abs: 1 10*3/uL (ref 0.7–4.0)
MCH: 28.4 pg (ref 26.0–34.0)
MCHC: 29 g/dL — ABNORMAL LOW (ref 30.0–36.0)
MCV: 98.1 fL (ref 80.0–100.0)
Monocytes Absolute: 1.4 10*3/uL — ABNORMAL HIGH (ref 0.1–1.0)
Monocytes Relative: 12 %
Neutro Abs: 9.3 10*3/uL — ABNORMAL HIGH (ref 1.7–7.7)
Neutrophils Relative %: 76 %
Platelets: 387 10*3/uL (ref 150–400)
RBC: 3.13 MIL/uL — ABNORMAL LOW (ref 4.22–5.81)
RDW: 16.8 % — ABNORMAL HIGH (ref 11.5–15.5)
WBC: 12 10*3/uL — ABNORMAL HIGH (ref 4.0–10.5)
nRBC: 0 % (ref 0.0–0.2)

## 2018-10-10 LAB — PHOSPHORUS: Phosphorus: 3.6 mg/dL (ref 2.5–4.6)

## 2018-10-10 LAB — PROCALCITONIN: Procalcitonin: 5.47 ng/mL

## 2018-10-10 LAB — PROTIME-INR
INR: 1.32
Prothrombin Time: 16.2 seconds — ABNORMAL HIGH (ref 11.4–15.2)

## 2018-10-10 LAB — PARATHYROID HORMONE, INTACT (NO CA): PTH: 242 pg/mL — ABNORMAL HIGH (ref 15–65)

## 2018-10-10 LAB — CORTISOL-AM, BLOOD: Cortisol - AM: 21.2 ug/dL (ref 6.7–22.6)

## 2018-10-10 LAB — GLUCOSE, CAPILLARY: Glucose-Capillary: 138 mg/dL — ABNORMAL HIGH (ref 70–99)

## 2018-10-10 MED ORDER — EPOETIN ALFA 10000 UNIT/ML IJ SOLN
4000.0000 [IU] | INTRAMUSCULAR | Status: DC
  Administered 2018-10-10: 4000 [IU] via INTRAVENOUS
  Filled 2018-10-10: qty 1

## 2018-10-10 MED ORDER — HYDRALAZINE HCL 20 MG/ML IJ SOLN: 10.0000 mg | INTRAMUSCULAR | Status: DC | PRN

## 2018-10-10 MED ORDER — INSULIN ASPART 100 UNIT/ML ~~LOC~~ SOLN
0.0000 [IU] | Freq: Three times a day (TID) | SUBCUTANEOUS | Status: DC
  Administered 2018-10-11 (×2): 3 [IU] via SUBCUTANEOUS
  Filled 2018-10-10 (×2): qty 1

## 2018-10-10 NOTE — Progress Notes (Signed)
Post HD assessment. Pt tolerated tx well without c/o or complication. Net UF 3528, goal met.    10/10/18 1849  Vital Signs  Temp 98.4 F (36.9 C)  Temp Source Oral  Pulse Rate 82  Pulse Rate Source Monitor  Resp (!) 27  BP 131/62  BP Location Right Arm  BP Method Automatic  Patient Position (if appropriate) Lying  Oxygen Therapy  SpO2 100 %  O2 Device Nasal Cannula  O2 Flow Rate (L/min) 3 L/min  Dialysis Weight  Weight 103.7 kg  Type of Weight Post-Dialysis  Post-Hemodialysis Assessment  Rinseback Volume (mL) 250 mL  KECN 73.6 V  Dialyzer Clearance Lightly streaked  Duration of HD Treatment -hour(s) 3.5 hour(s)  Hemodialysis Intake (mL) 700 mL  UF Total -Machine (mL) 4228 mL  Net UF (mL) 3528 mL  Tolerated HD Treatment Yes  AVG/AVF Arterial Site Held (minutes) 10 minutes  AVG/AVF Venous Site Held (minutes) 10 minutes  Education / Care Plan  Dialysis Education Provided Yes  Documented Education in Care Plan Yes  Fistula / Graft Left Forearm Arteriovenous fistula  No Placement Date or Time found.   Placed prior to admission: Yes  Orientation: Left  Access Location: Forearm  Access Type: Arteriovenous fistula  Site Condition No complications  Fistula / Graft Assessment Present;Thrill;Bruit  Status Deaccessed  Drainage Description None

## 2018-10-10 NOTE — Progress Notes (Signed)
HD tx start    10/10/18 1504  Vital Signs  Pulse Rate 86  Pulse Rate Source Monitor  Resp (!) 21  BP (!) 148/76  BP Location Right Arm  BP Method Automatic  Patient Position (if appropriate) Lying  Oxygen Therapy  SpO2 98 %  O2 Device Nasal Cannula  O2 Flow Rate (L/min) 3 L/min  During Hemodialysis Assessment  Blood Flow Rate (mL/min) 400 mL/min  Arterial Pressure (mmHg) -180 mmHg  Venous Pressure (mmHg) 170 mmHg  Transmembrane Pressure (mmHg) 80 mmHg  Ultrafiltration Rate (mL/min) 1140 mL/min  Dialysate Flow Rate (mL/min) 800 ml/min  Conductivity: Machine  14.2  HD Safety Checks Performed Yes  Dialysis Fluid Bolus Normal Saline  Bolus Amount (mL) 250 mL  Intra-Hemodialysis Comments Tx initiated  Fistula / Graft Left Forearm Arteriovenous fistula  No Placement Date or Time found.   Placed prior to admission: Yes  Orientation: Left  Access Location: Forearm  Access Type: Arteriovenous fistula  Status Accessed  Needle Size 15

## 2018-10-10 NOTE — Progress Notes (Signed)
Notified Dell Ponto, Dialysis liaison at patient pathways of admission. Patient goes MWF at [facility] in [place].

## 2018-10-10 NOTE — Progress Notes (Signed)
Wife is requesting for me to find out if the patient is having dialysis today.  Dr Holley Raring called and he will be coming to see the patient

## 2018-10-10 NOTE — Progress Notes (Signed)
Pre HD assessment    10/10/18 1453  Vital Signs  Temp 99.1 F (37.3 C)  Temp Source Oral  Pulse Rate 88  Pulse Rate Source Monitor  Resp 19  BP (!) 149/70  BP Location Right Arm  BP Method Automatic  Patient Position (if appropriate) Lying  Oxygen Therapy  SpO2 98 %  O2 Device Nasal Cannula  O2 Flow Rate (L/min) 3 L/min  Pain Assessment  Pain Scale 0-10  Pain Score 0  Dialysis Weight  Weight 107.1 kg  Type of Weight Pre-Dialysis  Time-Out for Hemodialysis  What Procedure? HD   Pt Identifiers(min of two) First/Last Name;MRN/Account#  Correct Site? Yes  Correct Side? Yes  Correct Procedure? Yes  Consents Verified? Yes  Rad Studies Available? N/A  Safety Precautions Reviewed? Yes  Engineer, civil (consulting) Number  (7A)  Station Number 4  UF/Alarm Test Passed  Conductivity: Meter 13.8  Conductivity: Machine  13.9  pH 7.4  Reverse Osmosis main  Normal Saline Lot Number [id number]  Dialyzer Lot Number [id number]  Disposable Set Lot Number [id number]  Machine Temperature 98.6 F (37 C)  Musician and Audible Yes  Blood Lines Intact and Secured Yes  Pre Treatment Patient Checks  Vascular access used during treatment Fistula  Hepatitis B Surface Antigen Results Negative  Date Hepatitis B Surface Antigen Drawn 08/13/18  Hepatitis B Surface Antibody  (>10)  Date Hepatitis B Surface Antibody Drawn 10/13/18  Hemodialysis Consent Verified Yes  Hemodialysis Standing Orders Initiated Yes  ECG (Telemetry) Monitor On Yes  Prime Ordered Normal Saline  Length of  DialysisTreatment -hour(s) 3.5 Hour(s)  Dialyzer Elisio 17H NR  Dialysate 2K, 2.5 Ca  Dialysis Anticoagulant None  Dialysate Flow Ordered 800  Blood Flow Rate Ordered 400 mL/min  Ultrafiltration Goal 3.5 Liters  Pre Treatment Labs Phosphorus  Dialysis Blood Pressure Support Ordered Normal Saline  Education / Care Plan  Dialysis Education Provided Yes  Documented Education in Care Plan Yes  Fistula / Graft  Left Forearm Arteriovenous fistula  No Placement Date or Time found.   Placed prior to admission: Yes  Orientation: Left  Access Location: Forearm  Access Type: Arteriovenous fistula  Site Condition No complications  Fistula / Graft Assessment Present;Thrill;Bruit  Drainage Description None

## 2018-10-10 NOTE — Progress Notes (Signed)
HD tx end    10/10/18 1844  Vital Signs  Pulse Rate (!) 130  Pulse Rate Source Monitor  Resp (!) 31  BP 111/67  BP Location Right Arm  BP Method Automatic  Patient Position (if appropriate) Lying  Oxygen Therapy  SpO2 100 %  O2 Device Nasal Cannula  O2 Flow Rate (L/min) 3 L/min  During Hemodialysis Assessment  Dialysis Fluid Bolus Normal Saline  Bolus Amount (mL) 250 mL  Intra-Hemodialysis Comments Tx completed

## 2018-10-10 NOTE — Progress Notes (Signed)
Post HD assessment    10/10/18 1847  Neurological  Level of Consciousness Alert  Orientation Level Oriented X4  Respiratory  Respiratory Pattern Regular;Unlabored  Chest Assessment Chest expansion symmetrical  Cardiac  Pulse Irregular  ECG Monitor Yes  Cardiac Rhythm ST;Atrial fibrillation  Vascular  R Radial Pulse +2  L Radial Pulse +2  Edema Generalized  Integumentary  Integumentary (WDL) X  Skin Color Appropriate for ethnicity  Musculoskeletal  Musculoskeletal (WDL) X  Generalized Weakness Yes  Assistive Device None  GU Assessment  Genitourinary (WDL) X  Genitourinary Symptoms  (HD)  Psychosocial  Psychosocial (WDL) WDL

## 2018-10-10 NOTE — Progress Notes (Signed)
CCMD notified me that the patient converted to afib.  Dr Jerelyn Charles has been informed of this.  He is ordering a cardiology consult.  Since talking to Dr Jerelyn Charles the patient did convert back to sinus.  Orders also received to renew telemetry, change VS to QS, wound consult for right AKA and pressure ulcers to left buttock, and CBG with insulin coverage

## 2018-10-10 NOTE — Consult Note (Signed)
Cardiology Consultation:   Patient ID: [name]; [id number]; Oct 25, 1960   Admit date: 10/09/2018 Date of Consult: 10/10/2018  Primary Care Provider: [name], MD Primary Cardiologist: [name]   Patient Profile:   [name] is a 57 y.o. male with a hx of CAD s/p polymorphic VT arrest and MI in 2017 as detailed below, HFrEF secondary to ICM with subsequent normalization of his EF, PAF diagnosed in 08/2018 no longer on [place] secondary to anemia, NSVT, PAD s/p left BKA and right AKA, prior stroke, ESRD on HD, DM2, relative hypotension/labile BP, HLD, and anemia of chronic disease who is being seen today for the evaluation of PAF with RVR and NSVT at the request of Dr. Jerelyn [name]  History of Present Illness:   [name] was admitted in 04/2016 with an MI and polymorphic VT arrest. Cardiac cath at that time showed CTO of the RCA with left-to-right collaterals and minimal irregularities involving the left coronary arteries.  He was noted to have moderately reduced LV systolic function with an EF of 30 to 35% with global hypokinesis that was more pronounced in the basal inferior wall.  It was recommended to medically manage. Echo showed an EF of 40 to 45%, mild mitral regurgitation, mildly dilated left atrium, moderate pulmonary regurgitation, PASP 45 mmHg.  Patient was noted to have frequent PVCs during the admission with recommendation for outpatient cardiac monitoring and EP follow-up.  Following that admission, the patient was lost to follow-up.    Patient was recently admitted to [facility] in 08/2018, with acute limb ischemia of the right lower extremity requiring right AKA.  On 09/10/2018 he developed A. fib with RVR and nonsustained VT.  He was placed on IV amiodarone and subsequently converted to oral amiodarone.  Initially, he was placed on Eliquis and subsequently discharged.  He was readmitted on 12/8 with chest pain, fevers, and chills.  He was noted to be back in atrial  fibrillation.  He was also anemic with a hemoglobin of 6.8 and required blood transfusion.  His troponin was mildly elevated at 0.26.  This was felt to be secondary to demand ischemia in the setting of A. fib with RVR and sepsis.  He was not felt to be a candidate for invasive procedures such as diagnostic cardiac catheterization.  Given he required transfusion, the decision was made to discontinue Eliquis.  Echo during that admission showed normal LV systolic function with a small to moderate pericardial effusion without tamponade physiology.  It was felt no further work-up was required.  He was reloaded with amiodarone and subsequently converted to sinus rhythm.  He was last seen in the office on 10/02/2018 and was doing reasonably well.  He did note some mild chest discomfort at the end of his dialysis sessions.  He was noted to be in A. fib with ventricular rates in the 90s bpm at that time.  His amiodarone was increased to 200 mg twice daily.  Patient was admitted on 10/09/2018 with increased shortness of breath with hypoxia with reported oxygen saturations of 88% on room air.  There was associated cough and fever.  In the ED, he was noted to have a leukocytosis of 19,000 that has improved to 12,000 and a chest x-ray concerning for fluid overload/edema.  Hemoglobin 9.2 trending to 8.9.  BNP 3708.  Troponin 0.05.  Potassium elevated at 5.8.  Creatinine 8.4 with known ESRD.  Influenza negative.  Blood culture no growth to date x2.  He  was admitted with volume overload and sepsis and underwent hemodialysis per nephrology.  He has been started on empiric antibiotic therapy for sepsis per internal medicine.  On 12/27, the patient noted improved shortness of breath following hemodialysis.  He was consulted on by vascular surgery with recommendation to remove staples from his surgical site following his recent right AKA as outlined above.  His hyperkalemia resolved with hemodialysis.  On the afternoon of 12/27,  nurse was notified by telemetry that the patient had developed A. Fib.  The patient developed Afib with RVR with ventricular rates into the 140s bpm at 12:36 PM and converted 13:08. He spontaneously converted back to sinus rhythm. During this, he was noted to have occasional PVCs with short runs of NSVT with the longest being 5 beats.  He has since remained in sinus rhythm with occasional PVCs and ventricular couplets.  Typically, only patient has been in A. fib with RVR he developed chest tightness.  However, in this most recent episode he was completely asymptomatic.  His main complaint at this time is a chronic cough that has been present since 08/2018.  He is currently scheduled for hemodialysis this afternoon.  History was taken with the assistance of the hospital provided medical interpreter video service.    Past Medical History:  Diagnosis Date  . (HFpEF) heart failure with preserved ejection fraction ([place])    a. 2017 EF per LHC 30-35%; b. 09/2018 Echo: EF 55-60%, no rwma, Gr2 DD, mild MR, mildly dil LA. PASP 61mmHg. small to mod circumferential pericardial eff, no tamponade.  . Anemia of chronic disease   . Atherosclerosis   . CAD (coronary artery disease)    a. 2017 LHC, severe 1v disease, occluded pRCA, L-R collaterals, moderate LV dysfunction EF 30-35.  . Diabetes mellitus with complication ([place])   . End stage renal disease on dialysis ([place])    Labile BP  . History of ventricular tachycardia    a. 2017 polymorphic VT and cardiac arrest.  . Hypercholesteremia   . Hyperlipidemia LDL goal <70   . Hypertension   . Labile blood pressure    on HD  . NSVT (nonsustained ventricular tachycardia) ([place])    08/2018 NSVT  . PAF (paroxysmal atrial fibrillation) ([place])    a. 08/2018 CHA2DS2VASc at least 6-->initially placed on eliquis but d/c'd 2/2 anemia. On [place].  Marland Kitchen Peripheral arterial occlusive disease (HCC)    s/p L BKA, R AKA  . S/P AKA (above knee amputation) unilateral, right ([place])     08/2018  . S/P BKA (below knee amputation) unilateral, left ([place])    2015  . Stroke [facility])     Past Surgical History:  Procedure Laterality Date  . A/V FISTULAGRAM Left 01/09/2017   Procedure: A/V Fistulagram;  Surgeon: [name], MD;  Location: [place] [facility];  Service: Cardiovascular;  Laterality: Left;  . A/V FISTULAGRAM Left 06/06/2017   Procedure: A/V Fistulagram;  Surgeon: [name], MD;  Location: [place] [place] [facility];  Service: Cardiovascular;  Laterality: Left;  . A/V FISTULAGRAM Left 08/15/2017   Procedure: A/V Fistulagram;  Surgeon: [name], MD;  Location: [place] [facility];  Service: Cardiovascular;  Laterality: Left;  . A/V FISTULAGRAM Left 01/22/2018   Procedure: A/V FISTULAGRAM;  Surgeon: [name], MD;  Location: [place] [facility];  Service: Cardiovascular;  Laterality: Left;  . A/V SHUNT INTERVENTION N/A 06/06/2017   Procedure: A/V SHUNT INTERVENTION;  Surgeon: [name], MD;  Location: [facility]  INVASIVE CV LAB;  Service: Cardiovascular;  Laterality: N/A;  . AMPUTATION Right 04/25/2016   Procedure: TOE AMPUTATION;  Surgeon: Katha Cabal, MD;  Location: [facility] ORS;  Service: Vascular;  Laterality: Right;  . AMPUTATION Right 09/09/2018   Procedure: AMPUTATION ABOVE KNEE;  Surgeon: [name], MD;  Location: [facility] ORS;  Service: Vascular;  Laterality: Right;  . APPLICATION OF WOUND VAC Right 04/25/2016   Procedure: APPLICATION OF WOUND VAC;  Surgeon: Katha Cabal, MD;  Location: [facility] OR[facility];  Service: Vascular;  Laterality: Right;  . AV FISTULA PLACEMENT Left 2015  . CARDIAC CATHETERIZATION N/A 04/30/2016   Procedure: Left Heart Cath and Coronary Angiography;  Surgeon: Wellington Hampshire, MD;  Location: [place] [facility];  Service: Cardiovascular;  Laterality: N/A;  . CATARACT EXTRACTION, BILATERAL    . LEG AMPUTATION BELOW KNEE Left   . LOWER EXTREMITY ANGIOGRAPHY Right 07/24/2018   Procedure: LOWER EXTREMITY ANGIOGRAPHY;  Surgeon: [name], MD;   Location: [place] [facility];  Service: Cardiovascular;  Laterality: Right;  . LOWER EXTREMITY ANGIOGRAPHY Right 08/13/2018   Procedure: LOWER EXTREMITY ANGIOGRAPHY;  Surgeon: [name], MD;  Location: [place] [facility];  Service: Cardiovascular;  Laterality: Right;  . LOWER EXTREMITY ANGIOGRAPHY Right 09/04/2018   Procedure: Lower Extremity Angiography;  Surgeon: [name], MD;  Location: [place] [facility];  Service: Cardiovascular;  Laterality: Right;  . PERIPHERAL VASCULAR CATHETERIZATION N/A 10/03/2015   Procedure: A/V Shuntogram/Fistulagram;  Surgeon: [name], MD;  Location: [place] [facility];  Service: Cardiovascular;  Laterality: N/A;  . PERIPHERAL VASCULAR CATHETERIZATION N/A 01/16/2016   Procedure: Abdominal Aortogram w/Lower Extremity;  Surgeon: [name], MD;  Location: [place] [facility];  Service: Cardiovascular;  Laterality: N/A;  . PERIPHERAL VASCULAR CATHETERIZATION  01/16/2016   Procedure: Lower Extremity Intervention;  Surgeon: [name], MD;  Location: [name] [facility];  Service: Cardiovascular;;  . PERIPHERAL VASCULAR CATHETERIZATION Right 04/27/2016   Procedure: Lower Extremity Angiography;  Surgeon: Katha Cabal, MD;  Location: [place] [facility];  Service: Cardiovascular;  Laterality: Right;  . PERIPHERAL VASCULAR CATHETERIZATION  04/27/2016   Procedure: Lower Extremity Intervention;  Surgeon: Katha Cabal, MD;  Location: [place] [facility];  Service: Cardiovascular;;  . TRANSMETATARSAL AMPUTATION Right 04/28/2016   Procedure: TRANSMETATARSAL AMPUTATION;  Surgeon: Albertine Patricia, DPM;  Location: [facility];  Service: Podiatry;  Laterality: Right;  . WOUND DEBRIDEMENT Right 04/25/2016   Procedure: DEBRIDEMENT WOUND;  Surgeon: Katha Cabal, MD;  Location: [facility];  Service: Vascular;  Laterality: Right;     Home Meds: Prior to Admission medications   Medication Sig Start Date End Date Taking? Authorizing Provider  acetaminophen  (TYLENOL) 325 MG tablet Take 650 mg by mouth every 6 (six) hours as needed.   Yes [provider]  albuterol (ACCUNEB) 0.63 MG/3ML nebulizer solution Take 1 ampule by nebulization every 4 (four) hours as needed for wheezing.   Yes [provider]  aspirin EC 81 MG tablet Take 81 mg by mouth daily.   Yes [provider]  b complex-vitamin c-folic acid (NEPHRO-VITE) 0.8 MG TABS tablet Take 1 tablet by mouth daily.   Yes [provider]  cinacalcet (SENSIPAR) 60 MG tablet Take 60 mg by mouth daily.    Yes [provider]  ferrous sulfate 325 (65 FE) MG EC tablet Take 325 mg by mouth 3 (three) times daily with meals.   Yes [provider]  glipiZIDE (GLUCOTROL) 10 MG tablet Take 10 mg by mouth daily.  10/25/16  Yes [provider]  lactulose (CHRONULAC) 10 GM/15ML solution Take 10 g by mouth daily.   Yes [provider]  midodrine (PROAMATINE) 10 MG tablet Take 1 tablet (10 mg total) by mouth 3 (three) times daily with meals. 09/28/18  Yes Mody, Ulice Bold, MD  omeprazole (PRILOSEC) 40 MG capsule Take 40 mg by mouth 2 (two) times daily.    Yes [provider]  polyethylene glycol (MIRALAX / GLYCOLAX) packet Take 17 g by mouth 2 (two) times daily.    Yes [provider]  senna-docusate (SENOKOT-S) 8.6-50 MG tablet Take 1 tablet by mouth 2 (two) times daily.   Yes [provider]  sevelamer carbonate (RENVELA) 800 MG tablet Take 2,400 mg by mouth 3 (three) times daily with meals.    Yes [provider]  Amino Acids-Protein Hydrolys (FEEDING SUPPLEMENT, PRO-STAT SUGAR FREE 64,) LIQD Take 30 mLs by mouth daily.    [provider]  amiodarone (PACERONE) 200 MG tablet Take 1 tablet (200 mg total) by mouth 2 (two) times daily. Patient not taking: Reported on 10/09/2018 10/02/18   Theora Gianotti, NP  lidocaine-prilocaine (EMLA) cream Apply 1 application topically as needed (port access).     [provider]  Nutritional Supplements (FEEDING SUPPLEMENT, NEPRO CARB STEADY,) LIQD Take 237 mLs by mouth 2 (two) times daily between meals. 09/28/18   Bettey Costa, MD  oxyCODONE (OXY IR/ROXICODONE) 5 MG immediate release tablet Take 1 tablet (5 mg total) by mouth every 6 (six) hours as needed for moderate pain or severe pain. 09/28/18   Bettey Costa, MD    Inpatient Medications: Scheduled Meds: . albuterol  2.5 mg Nebulization Q6H  . amiodarone  200 mg Oral BID  . aspirin EC  81 mg Oral Daily  . Chlorhexidine Gluconate Cloth  6 each Topical Q0600  . cinacalcet  60 mg Oral Q breakfast  . [START ON 10/11/2018] epoetin (EPOGEN/PROCRIT) injection  4,000 Units Intravenous Q T,Th,Sa-HD  . feeding supplement (NEPRO CARB STEADY)  237 mL Oral BID BM  . feeding supplement (PRO-STAT SUGAR FREE 64)  30 mL Oral Daily  . ferrous sulfate  325 mg Oral TID WC  . glipiZIDE  10 mg Oral Daily  . heparin  5,000 Units Subcutaneous Q8H  . insulin aspart  0-15 Units Subcutaneous TID WC  . lactulose  10 g Oral Daily  . midodrine  10 mg Oral TID WC  . multivitamin  1 tablet Oral QHS  . pantoprazole  40 mg Oral Daily  . polyethylene glycol  17 g Oral BID  . senna-docusate  1 tablet Oral BID  . sevelamer carbonate  2,400 mg Oral TID WC   Continuous Infusions: . sodium chloride Stopped (10/10/18 1206)  . ceFEPime (MAXIPIME) IV Stopped (10/10/18 0900)  . metronidazole Stopped (10/10/18 1124)  . vancomycin    . vancomycin Stopped (10/09/18 1147)   PRN Meds: sodium chloride, acetaminophen, albuterol, guaiFENesin-dextromethorphan, hydrALAZINE, lidocaine-prilocaine, ondansetron **OR** ondansetron (ZOFRAN) IV, oxyCODONE  Allergies:  No Known Allergies  Social History:   Social History   Socioeconomic History  . Marital status: Married    Spouse name: Not on file  . Number of children: Not on file  . Years of education: Not on file  . Highest education level: Not on file  Occupational History   . Not on file  Social Needs  . Financial resource strain: Not on file  .  Food insecurity:    Worry: Patient refused    Inability: Patient refused  . Transportation needs:    Medical: No    Non-medical: No  Tobacco Use  . Smoking status: Never Smoker  . Smokeless tobacco: Never Used  Substance and Sexual Activity  . Alcohol use: No  . Drug use: No  . Sexual activity: Not on file  Lifestyle  . Physical activity:    Days per week: Not on file    Minutes per session: Not on file  . Stress: Not at all  Relationships  . Social connections:    Talks on phone: More than three times a week    Gets together: Three times a week    Attends religious service: Not on file    Active member of club or organization: Not on file    Attends meetings of clubs or organizations: Not on file    Relationship status: Not on file  . Intimate partner violence:    Fear of current or ex partner: No    Emotionally abused: Not on file    Physically abused: Not on file    Forced sexual activity: Not on file  Other Topics Concern  . Not on file  Social History Narrative  . Not on file     Family History:   Family History  Problem Relation Age of Onset  . Diabetes Father   . Prostate cancer Father     ROS:  Review of Systems  Constitutional: Positive for malaise/fatigue. Negative for chills, diaphoresis, fever and weight loss.  HENT: Negative for congestion.   Eyes: Negative for discharge and redness.  Respiratory: Positive for cough and shortness of breath. Negative for hemoptysis, sputum production and wheezing.   Cardiovascular: Positive for palpitations. Negative for chest pain, orthopnea, claudication, leg swelling and PND.  Gastrointestinal: Negative for abdominal pain, blood in stool, heartburn, melena, nausea and vomiting.  Genitourinary: Negative for hematuria.  Musculoskeletal: Negative for falls and myalgias.  Skin: Negative for rash.  Neurological: Positive for weakness.  Negative for dizziness, tingling, tremors, sensory change, speech change, focal weakness and loss of consciousness.  Endo/Heme/Allergies: Does not bruise/bleed easily.  Psychiatric/Behavioral: Negative for substance abuse. The patient is not nervous/anxious.   All other systems reviewed and are negative.     Physical Exam/Data:   Vitals:   10/10/18 0730 10/10/18 0816 10/10/18 0934 10/10/18 1332  BP: 138/79 (!) 150/85  121/66  Pulse: 84 84  86  Resp: (!) 24   18  Temp: 97.6 F (36.4 C)   98.2 F (36.8 C)  TempSrc: Oral   Oral  SpO2: 100%   97%  Weight:      Height:   5\' 7"  (1.702 m)     Intake/Output Summary (Last 24 hours) at 10/10/2018 1359 Last data filed at 10/10/2018 1348 Gross per 24 hour  Intake 1073.48 ml  Output 2517 ml  Net -1443.52 ml   Filed Weights   10/09/18 1137 10/09/18 1535  Weight: 114.7 kg 112.2 kg   Body mass index is 38.74 kg/m.   Physical Exam: General: Well developed, well nourished, in no acute distress. Head: Normocephalic, atraumatic, sclera non-icteric, no xanthomas, nares without discharge.  Neck: Negative for carotid bruits. JVD not elevated. Lungs: Diminished breath sounds bilaterally with faint crackles along the bilateral bases. Breathing is unlabored. Heart: RRR with S1 S2. No murmurs, rubs, or gallops appreciated. Abdomen: Soft, non-tender, non-distended with normoactive bowel sounds. No hepatomegaly. No rebound/guarding. No obvious abdominal  masses. Msk:  Strength and tone appear normal for age. Extremities: Status post right sided AKA and left-sided BKA. Neuro: Alert and oriented X 3. No facial asymmetry. No focal deficit. Moves all extremities spontaneously. Psych:  Responds to questions appropriately with a normal affect.   EKG:  The EKG was personally reviewed and demonstrates: Not currently scanned in epic for review Telemetry:  Telemetry was personally reviewed and demonstrates: Currently in sinus rhythm with occasional PVCs  and ventricular couplets.  Patient developed A. fib with RVR at 12:36 PM and sustained this rhythm to 1:08 PM with occasional PVCs, ventricular couplets, and short runs of NSVT with the longest being 5 beats.  Weights: Filed Weights   10/09/18 1137 10/09/18 1535  Weight: 114.7 kg 112.2 kg    Relevant CV Studies: Echo 09/27/2018: Study Conclusions  - Left ventricle: The cavity size was normal. There was moderate   concentric hypertrophy. Systolic function was normal. The   estimated ejection fraction was in the range of 55% to 60%. Wall   motion was normal; there were no regional wall motion   abnormalities. Features are consistent with a pseudonormal left   ventricular filling pattern, with concomitant abnormal relaxation   and increased filling pressure (grade 2 diastolic dysfunction). - Mitral valve: There was mild regurgitation. - Left atrium: The atrium was mildly dilated. - Pulmonary arteries: Systolic pressure was mildly elevated. PA   peak pressure: 41 mm Hg (S). - Pericardium, extracardiac: A small to moderate sized   circumferential pericardial effusion was identified. 3 cm pocket   noted outside the RV free wall, otherwise 1 cm circumferential   effusion. Features were not consistent with tamponade physiology.  Laboratory Data:  Chemistry Recent Labs  Lab 10/09/18 0651 10/10/18 0847  NA 135 139  K 5.8* 4.4  CL 93* 98  CO2 25 27  GLUCOSE 247* 197*  BUN 76* 50*  CREATININE 8.40* 5.57*  CALCIUM 8.8* 9.1  GFRNONAA 6* 10*  GFRAA 7* 12*  ANIONGAP 17* 14    No results for input(s): PROT, ALBUMIN, AST, ALT, ALKPHOS, BILITOT in the last 168 hours. Hematology Recent Labs  Lab 10/09/18 0651 10/10/18 0847  WBC 19.5* 12.0*  RBC 3.21* 3.13*  HGB 9.2* 8.9*  HCT 31.0* 30.7*  MCV 96.6 98.1  MCH 28.7 28.4  MCHC 29.7* 29.0*  RDW 17.1* 16.8*  PLT 469* 387   Cardiac Enzymes Recent Labs  Lab 10/09/18 0651  TROPONINI 0.05*   No results for input(s): TROPIPOC in  the last 168 hours.  BNP Recent Labs  Lab 10/09/18 0637  BNP 3,708.0*    DDimer No results for input(s): DDIMER in the last 168 hours.  Radiology/Studies:  US Venous Img Lower Unilateral Right  Result Date: 10/09/2018 IMPRESSION: No evidence of DVT in the right lower extremity. Electronically Signed   By: Marybelle Killings M.D.   On: 10/09/2018 16:53   Dg Chest Portable 1 View  Result Date: 10/09/2018 IMPRESSION: Vascular congestion and mild cardiomegaly. Increased interstitial markings raise concern for pulmonary edema. Right lateral pleural thickening may reflect a small right pleural effusion. Electronically Signed   By: Garald Balding M.D.   On: 10/09/2018 06:57    Assessment and Plan:   1.  PAF: -Currently maintaining sinus rhythm -Likely in the setting of the patient's volume overload and sepsis of uncertain etiology -Continue amiodarone 200 mg twice daily -Not currently on oral anticoagulation given anemia of chronic disease that has previously required packed red blood cell transfusion -  Not currently on beta-blocker as outlined below  2. NSVT: -Asymptomatic -Check magnesium and TSH -Potassium at goal -Continue amiodarone as above -Not currently on beta-blocker in the setting of relative hypotension and labile blood pressure  3. Elevated troponin: -Initial troponin minimally elevated at 0.05, not cycled -No symptoms suggestive of angina -Recent echo as above -No plans for inpatient ischemic evaluation at this time  4. CAD involving the native coronary arteries: -He is status post prior MI with known CTO of the RCA with left-to-right collaterals.  He has been felt to be a poor candidate for further cardiac diagnostic procedures and medical therapy has been recommended although this is somewhat limited secondary to soft blood pressures.  He is not having any symptoms suggestive of angina at this time.  Therefore, we will continue him on current medical therapy.  5.   Acute on chronic combined CHF with subsequent normalization of LVSF: -Volume managed by hemodialysis -Not on beta-blocker secondary to relative hypotension -Not on ACE inhibitor/ARB/spironolactone in the setting of relative hypotension and underlying CKD  6.  ESRD: -Patient had gone 3 days without hemodialysis leading up to his admission on 12/26 -He is now scheduled for his second consecutive day of hemodialysis -Volume managed by hemodialysis -Per nephrology  7.  Anemia of chronic disease: -Appears stable -Per IM  8.  PAD: -Status post recent right AKA and left BKA -Per IM   For questions or updates, please contact [place] Please consult [email or web address] for contact info under Cardiology/STEMI.   Signed, Christell Faith, PA-C [place] Pager: [phone] 10/10/2018, 1:59 PM

## 2018-10-10 NOTE — Progress Notes (Signed)
[place] [facility] [place] NAME: [name]    MR#:  [id number]  DATE OF BIRTH:  1961/07/17  SUBJECTIVE:  Patient feeling better, wife at bedside, vascular surgery and nephrology input appreciated, patient without complaint  REVIEW OF SYSTEMS:  CONSTITUTIONAL: No fever, fatigue or weakness.  EYES: No blurred or double vision.  EARS, NOSE, AND THROAT: No tinnitus or ear pain.  RESPIRATORY: No cough, shortness of breath, wheezing or hemoptysis.  CARDIOVASCULAR: No chest pain, orthopnea, edema.  GASTROINTESTINAL: No nausea, vomiting, diarrhea or abdominal pain.  GENITOURINARY: No dysuria, hematuria.  ENDOCRINE: No polyuria, nocturia,  HEMATOLOGY: No anemia, easy bruising or bleeding SKIN: No rash or lesion. MUSCULOSKELETAL: No joint pain or arthritis.   NEUROLOGIC: No tingling, numbness, weakness.  PSYCHIATRY: No anxiety or depression.   ROS  DRUG ALLERGIES:  No Known Allergies  VITALS:  Blood pressure (!) 150/85, pulse 84, temperature 97.6 F (36.4 C), temperature source Oral, resp. rate (!) 24, weight 112.2 kg, SpO2 100 %.  PHYSICAL EXAMINATION:  GENERAL:  57 y.o.-year-old patient lying in the bed with no acute distress.  Obese EYES: Pupils equal, round, reactive to light and accommodation. No scleral icterus. Extraocular muscles intact.  HEENT: Head atraumatic, normocephalic. Oropharynx and nasopharynx clear.  NECK:  Supple, no jugular venous distention. No thyroid enlargement, no tenderness.  LUNGS: Diminished breath sounds bilaterally . No use of accessory muscles of respiration.  CARDIOVASCULAR: S1, S2 normal. No murmurs, rubs, or gallops.  ABDOMEN: Soft, nontender, nondistended. Bowel sounds present. No organomegaly or mass.  EXTREMITIES: No pedal edema, cyanosis, or clubbing.  Right stump with necrotic skin change along incision-status post staple removal, very mild skin ulceration with serosanguineous drainage NEUROLOGIC: Cranial  nerves II through XII are intact. MAES. Gait not checked.  PSYCHIATRIC: The patient is alert and oriented x 3.  SKIN: No obvious rash, lesion, or ulcer.   Physical Exam LABORATORY PANEL:   CBC Recent Labs  Lab 10/10/18 0847  WBC 12.0*  HGB 8.9*  HCT 30.7*  PLT 387   ------------------------------------------------------------------------------------------------------------------  Chemistries  Recent Labs  Lab 10/10/18 0847  NA 139  K 4.4  CL 98  CO2 27  GLUCOSE 197*  BUN 50*  CREATININE 5.57*  CALCIUM 9.1   ------------------------------------------------------------------------------------------------------------------  Cardiac Enzymes Recent Labs  Lab 10/09/18 0651  TROPONINI 0.05*   ------------------------------------------------------------------------------------------------------------------  RADIOLOGY:  US Venous Img Lower Unilateral Right  Result Date: 10/09/2018 CLINICAL DATA:  Right lower extremity pain. Right above the knee amputation. EXAM: RIGHT LOWER EXTREMITY VENOUS DUPLEX ULTRASOUND TECHNIQUE: Doppler venous assessment of the right lower extremity deep venous system was performed, including characterization of spectral flow, compressibility, and phasicity. COMPARISON:  None. FINDINGS: There is complete compressibility of the common femoral and femoral veins. Doppler analysis demonstrates respiratory phasicity. There is no obvious superficial vein thrombosis. IMPRESSION: No evidence of DVT in the right lower extremity. Electronically Signed   By: Marybelle Killings M.D.   On: 10/09/2018 16:53   Dg Chest Portable 1 View  Result Date: 10/09/2018 CLINICAL DATA:  Acute onset of shortness of breath and anxiety. Decreased O2 saturation. Patient on dialysis. EXAM: PORTABLE CHEST 1 VIEW COMPARISON:  Chest radiograph performed 09/21/2018, and CT of the chest performed 09/26/2014 FINDINGS: The lungs are well-aerated. Vascular congestion is noted. Increased  interstitial markings raise concern for pulmonary edema. Right lateral pleural thickening may reflect a small right pleural effusion. There is no evidence of pneumothorax. The cardiomediastinal silhouette is mildly enlarged.  No acute osseous abnormalities are seen. IMPRESSION: Vascular congestion and mild cardiomegaly. Increased interstitial markings raise concern for pulmonary edema. Right lateral pleural thickening may reflect a small right pleural effusion. Electronically Signed   By: Garald Balding M.D.   On: 10/09/2018 06:57    ASSESSMENT AND PLAN:  *Acute sepsis Resolved Exact etiology unknown as source- ?  Right AKA stump with noted serosanguineous drainage Continue sepsis protocol, empiric cefepime/Flagyl/vancomycin, follow-up on cultures  *Acute systolic congestive heart failure exacerbation/fluid overload state Improved status post hemodialysis Secondary to end-stage renal disease Nephrology input appreciated  *Acute hyperkalemia Resolved with hemodialysis  *Chronic end-stage renal disease Neurology consulted for hemodialysis needs, possible repeat hemodialysis later today  *Acute recent right AKA Stable Status post stable removal, vascular surgery input appreciated, right lower extremity negative for DVT   *Chronic paroxysmal A. Fib Stable Continue aspirin, amiodarone  *Chronic diabetes mellitus type 2 Controlled on current regiment  Continue sliding scale insulin with Accu-Cheks per routine  Disposition to skilled nursing facility in 1 to 2 days barring any complications  All the records are reviewed and case discussed with Care Management/Social Workerr. Management plans discussed with the patient, family and they are in agreement.  CODE STATUS: full  TOTAL TIME TAKING CARE OF THIS PATIENT: 35 minutes.     POSSIBLE D/C IN 1-2 DAYS, DEPENDING ON CLINICAL CONDITION.   Avel Peace Harbour Nordmeyer M.D on 10/10/2018   Between 7am to 6pm - Pager - [phone]  After  6pm go to [email or web address] - password [email or web address] [facility]  Office  [phone]  CC: Primary care physician; [name], MD  Note: This dictation was prepared with Dragon dictation along with smaller phrase technology. Any transcriptional errors that result from this process are unintentional.

## 2018-10-10 NOTE — Progress Notes (Signed)
Pharmacy Antibiotic Note  [name] is a 57 y.o. male admitted on 10/09/2018 with sepsis.  Pharmacy has been consulted for Cefepime and Vancomycin dosing. Patient is also ordered Flagyl 500mg  IV. Vancomycin 1g and Cefepime 2g ordered and given in ED.   Patient has PMH of ESRD w/HD  MWF.  Pt received 2g vanc total loading dose on 12/26  Plan: Plan for Vancomycin 1000 mg IV MWF with HD. Planning for HD today.  Will need to follow HD schedule and order vancomycin doses if extra HD sessions are planned.  Will draw vancomycin level prior to 3rd HD session.  Target Pre-HD level 15-25 mcg/mL.   Continue Cefepime 1gm IV every 24 hours.   Weight: 247 lb 5.7 oz (112.2 kg)  Temp (24hrs), Avg:98.5 F (36.9 C), Min:97.6 F (36.4 C), Max:98.9 F (37.2 C)  Recent Labs  Lab 10/09/18 0651 10/09/18 0705 10/10/18 0847  WBC 19.5*  --  12.0*  CREATININE 8.40*  --  5.57*  LATICACIDVEN  --  1.37  --     Estimated Creatinine Clearance: 17.1 mL/min (A) (by C-G formula based on SCr of 5.57 mg/dL (H)).    No Known Allergies  Antimicrobials this admission: 12/26 Vancomycin >>  12/26 Cefepime  >>  12/26 Flagyl >>   Dose adjustments this admission:   Microbiology results: 12/26 BCx: NGTD 12/26 MRSA PCR: pending  Thank you for allowing pharmacy to be a part of this patient's care.  Rocky Morel, PharmD, BCPS Clinical Pharmacist 10/10/2018 11:39 AM

## 2018-10-10 NOTE — Progress Notes (Signed)
Pre HD assessment    10/10/18 1454  Neurological  Level of Consciousness Alert  Orientation Level Oriented X4  Respiratory  Respiratory Pattern Regular;Unlabored  Chest Assessment Chest expansion symmetrical  Cardiac  Pulse Irregular  ECG Monitor Yes  Cardiac Rhythm NSR  Ectopy Unifocal PVC's  Ectopy Frequency Frequent  Vascular  R Radial Pulse +2  L Radial Pulse +2  Edema Generalized  Integumentary  Integumentary (WDL) X  Skin Color Appropriate for ethnicity  Musculoskeletal  Musculoskeletal (WDL) X  Generalized Weakness Yes  Assistive Device None  GU Assessment  Genitourinary (WDL) X  Genitourinary Symptoms  (HD)  Psychosocial  Psychosocial (WDL) WDL

## 2018-10-10 NOTE — Progress Notes (Signed)
[facility]  ROUNDING NOTE   Subjective:  Patient seen at bedside. Due for dialysis session today. Requests 3.5kg of UF.    Objective:  Vital signs in last 24 hours:  Temp:  [97.6 F (36.4 C)-98.9 F (37.2 C)] 97.6 F (36.4 C) (12/27 0730) Pulse Rate:  [78-87] 84 (12/27 0816) Resp:  [17-24] 24 (12/27 0730) BP: (135-172)/(74-98) 150/85 (12/27 0816) SpO2:  [90 %-100 %] 100 % (12/27 0730) Weight:  [112.2 kg] 112.2 kg (12/26 1535)  Weight change:  Filed Weights   10/09/18 1137 10/09/18 1535  Weight: 114.7 kg 112.2 kg    Intake/Output: I/O last 3 completed shifts: In: 886.1 [I.V.:144; IV Piggyback:742.1] Out: 2517 [Other:2517]   Intake/Output this shift:  Total I/O In: 120 [P.O.:120] Out: -   Physical Exam: General: No acute distress  Head: Normocephalic, atraumatic. Moist oral mucosal membranes  Eyes: Anicteric  Neck: Supple, trachea midline  Lungs:  Bilateral rales, normal effort  Heart: S1S2 no rubs  Abdomen:  Soft, nontender, bowel sounds present  Extremities: B/L LE amputations  Neurologic: Awake, alert, following commands  Skin: No lesions  Access: LUE AVF    Basic Metabolic Panel: Recent Labs  Lab 10/09/18 0651 10/09/18 1308 10/10/18 0847  NA 135  --  139  K 5.8*  --  4.4  CL 93*  --  98  CO2 25  --  27  GLUCOSE 247*  --  197*  BUN 76*  --  50*  CREATININE 8.40*  --  5.57*  CALCIUM 8.8*  --  9.1  PHOS  --  3.0  --     Liver Function Tests: No results for input(s): AST, ALT, ALKPHOS, BILITOT, PROT, ALBUMIN in the last 168 hours. No results for input(s): LIPASE, AMYLASE in the last 168 hours. No results for input(s): AMMONIA in the last 168 hours.  CBC: Recent Labs  Lab 10/09/18 0651 10/10/18 0847  WBC 19.5* 12.0*  NEUTROABS  --  9.3*  HGB 9.2* 8.9*  HCT 31.0* 30.7*  MCV 96.6 98.1  PLT 469* 387    Cardiac Enzymes: Recent Labs  Lab 10/09/18 0651  TROPONINI 0.05*    BNP: Invalid input(s): POCBNP  CBG: No  results for input(s): GLUCAP in the last 168 hours.  Microbiology: Results for orders placed or performed during the hospital encounter of 10/09/18  Blood Culture (routine x 2)     Status: None (Preliminary result)   Collection Time: 10/09/18  6:40 AM  Result Value Ref Range Status   Specimen Description BLOOD BLOOD RIGHT HAND  Final   Special Requests   Final    BOTTLES DRAWN AEROBIC AND ANAEROBIC Blood Culture adequate volume   Culture   Final    NO GROWTH 1 DAY Performed at [facility], [address] [place], [place] [address]    Report Status PENDING  Incomplete  Blood Culture (routine x 2)     Status: None (Preliminary result)   Collection Time: 10/09/18  2:47 PM  Result Value Ref Range Status   Specimen Description BLOOD PORTA CATH  Final   Special Requests   Final    BOTTLES DRAWN AEROBIC AND ANAEROBIC Blood Culture adequate volume   Culture   Final    NO GROWTH < 24 HOURS Performed at [facility], [address] [place], [place] [address]    Report Status PENDING  Incomplete    Coagulation Studies: Recent Labs    10/10/18 0403  LABPROT 16.2*  INR  1.32    Urinalysis: No results for input(s): COLORURINE, LABSPEC, PHURINE, GLUCOSEU, HGBUR, BILIRUBINUR, KETONESUR, PROTEINUR, UROBILINOGEN, NITRITE, LEUKOCYTESUR in the last 72 hours.  Invalid input(s): APPERANCEUR    Imaging: US Venous Img Lower Unilateral Right  Result Date: 10/09/2018 CLINICAL DATA:  Right lower extremity pain. Right above the knee amputation. EXAM: RIGHT LOWER EXTREMITY VENOUS DUPLEX ULTRASOUND TECHNIQUE: Doppler venous assessment of the right lower extremity deep venous system was performed, including characterization of spectral flow, compressibility, and phasicity. COMPARISON:  None. FINDINGS: There is complete compressibility of the common femoral and femoral veins. Doppler analysis demonstrates respiratory phasicity. There is no obvious superficial vein thrombosis.  IMPRESSION: No evidence of DVT in the right lower extremity. Electronically Signed   By: Marybelle Killings M.D.   On: 10/09/2018 16:53   Dg Chest Portable 1 View  Result Date: 10/09/2018 CLINICAL DATA:  Acute onset of shortness of breath and anxiety. Decreased O2 saturation. Patient on dialysis. EXAM: PORTABLE CHEST 1 VIEW COMPARISON:  Chest radiograph performed 09/21/2018, and CT of the chest performed 09/26/2014 FINDINGS: The lungs are well-aerated. Vascular congestion is noted. Increased interstitial markings raise concern for pulmonary edema. Right lateral pleural thickening may reflect a small right pleural effusion. There is no evidence of pneumothorax. The cardiomediastinal silhouette is mildly enlarged. No acute osseous abnormalities are seen. IMPRESSION: Vascular congestion and mild cardiomegaly. Increased interstitial markings raise concern for pulmonary edema. Right lateral pleural thickening may reflect a small right pleural effusion. Electronically Signed   By: Garald Balding M.D.   On: 10/09/2018 06:57     Medications:   . sodium chloride 250 mL (10/10/18 1023)  . ceFEPime (MAXIPIME) IV 1 g (10/10/18 0823)  . metronidazole 500 mg (10/10/18 1024)  . vancomycin    . vancomycin Stopped (10/09/18 1147)   . albuterol  2.5 mg Nebulization Q6H  . amiodarone  200 mg Oral BID  . aspirin EC  81 mg Oral Daily  . Chlorhexidine Gluconate Cloth  6 each Topical Q0600  . cinacalcet  60 mg Oral Q breakfast  . feeding supplement (NEPRO CARB STEADY)  237 mL Oral BID BM  . feeding supplement (PRO-STAT SUGAR FREE 64)  30 mL Oral Daily  . ferrous sulfate  325 mg Oral TID WC  . glipiZIDE  10 mg Oral Daily  . heparin  5,000 Units Subcutaneous Q8H  . lactulose  10 g Oral Daily  . midodrine  10 mg Oral TID WC  . multivitamin  1 tablet Oral QHS  . pantoprazole  40 mg Oral Daily  . polyethylene glycol  17 g Oral BID  . senna-docusate  1 tablet Oral BID  . sevelamer carbonate  2,400 mg Oral TID WC      Assessment/ Plan:  57 y.o. male Hispanic (Spanish speaking only) malewith end stage renal disease, peripheral vascular disease, hypertension, CVA, hyperlipidemia, diabetes mellitus type II, anemia of CKD, SHPTH, s/p b/l LE amputations  MWF [facility] EDW 106.5 kg Left AVF  1.  ESRD on HD MWF.  Pt due for regular HD treatment today, UF target 3.5kg for day.   2.  Hyperkalemia.  Potassium normalized now at 4.4.  Continue to monitor.  3.  Anemia chronic kidney disease.  Epogen 4000 units IV with HD.   4.  Secondary hyperparathyroidism.  Recheck serum phos today, otherise continue renvela 3 tabs po tid/wm.     LOS: 1 Averyana Pillars 12/27/20191:07 PM

## 2018-10-10 NOTE — NC FL2 (Signed)
[place] LEVEL OF CARE SCREENING TOOL     IDENTIFICATION  Patient Name: [name] Birthdate: 1961-08-01 Sex: male Admission Date (Current Location): 10/09/2018  [facility] and [place] Number:  Engineering geologist and Address:  [facility], [address] [place],  [address]      Provider Number: [id number]  Attending Physician Name and Address:  Gorden Harms, MD  Relative Name and Phone Number:       Current Level of Care: Hospital Recommended Level of Care: [place] Prior Approval Number:    Date Approved/Denied:   PASRR Number:    Discharge Plan: SNF    Current Diagnoses: Patient Active Problem List   Diagnosis Date Noted  . Pressure injury of skin 09/26/2018  . Paroxysmal atrial fibrillation (HCC)   . Sepsis ([place]) 09/21/2018  . Status post above-knee amputation ([place]) 09/18/2018  . End stage renal disease on dialysis ([place])   . NSVT (nonsustained ventricular tachycardia) ([place])   . Atrial fibrillation with rapid ventricular response ([place])   . Demand ischemia ([place])   . Limb ischemia 09/03/2018  . Arterial occlusion 08/12/2018  . ESRD on dialysis ([place]) 11/09/2016  . Diabetes ([place]) 11/09/2016  . PVD (peripheral vascular disease) ([place]) 11/09/2016  . Cardiac arrest ([place]) 04/28/2016  . Atherosclerosis of extremity with gangrene ([place]) 04/25/2016  . Diabetic polyneuropathy associated with type 2 diabetes mellitus ([place]) 12/09/2015  . Diabetic ulcer of right foot associated with type 2 diabetes mellitus ([place]) 12/09/2015  . Anemia 03/24/2014  . Left foot burn 03/17/2014  . Leukocytosis 03/17/2014  . Obesity 03/17/2014  . PND (paroxysmal nocturnal dyspnea) 06/29/2013  . Volume overload 06/29/2013  . Diastolic congestive heart failure, NYHA class 2 ([place]) 06/28/2013  . Shortness of breath 06/28/2013  . After-cataract with vision obscured 08/01/2011  . Anisometropia 08/01/2011  . Background diabetic  retinopathy ([place]) 08/01/2011  . Senile nuclear sclerosis 08/01/2011    Orientation RESPIRATION BLADDER Height & Weight     Self, Time, Place  O2(3 liters ) Continent Weight: 236 lb 1.8 oz (107.1 kg) Height:  5\' 7"  (170.2 cm)  BEHAVIORAL SYMPTOMS/MOOD NEUROLOGICAL BOWEL NUTRITION STATUS  (none) (none) Continent Diet(Renal diet. 1200 ml fluid restriction )  AMBULATORY STATUS COMMUNICATION OF NEEDS Skin   Extensive Assist Verbally Other (Comment)(Stage 2 on Buttock )                       Personal Care Assistance Level of Assistance  Bathing, Feeding, Dressing Bathing Assistance: Limited assistance Feeding assistance: Independent Dressing Assistance: Limited assistance     Functional Limitations Info  Sight, Hearing, Speech Sight Info: Adequate Hearing Info: Adequate Speech Info: Adequate    SPECIAL CARE FACTORS FREQUENCY  PT (By licensed PT)                    Contractures Contractures Info: Not present    Additional Factors Info  Code Status, Allergies Code Status Info: Full Code  Allergies Info: NKA           Current Medications (10/10/2018):  This is the current hospital active medication list Current Facility-Administered Medications  Medication Dose Route Frequency Provider Last Rate Last Dose  . 0.9 %  sodium chloride infusion   Intravenous PRN Salary, Avel Peace, MD   Stopped at 10/10/18 1206  . acetaminophen (TYLENOL) tablet 650 mg  650 mg Oral Q6H PRN Salary, Avel Peace, MD      .  albuterol (PROVENTIL) (2.5 MG/3ML) 0.083% nebulizer solution 2.5 mg  2.5 mg Nebulization Q6H Salary, Montell D, MD   2.5 mg at 10/10/18 1345  . albuterol (PROVENTIL) (2.5 MG/3ML) 0.083% nebulizer solution 3 mL  3 mL Nebulization Q4H PRN Salary, Montell D, MD      . amiodarone (PACERONE) tablet 200 mg  200 mg Oral BID Loney Hering D, MD   200 mg at 10/10/18 0824  . aspirin EC tablet 81 mg  81 mg Oral Daily Salary, Montell D, MD   81 mg at 10/10/18 0824  . ceFEPIme  (MAXIPIME) 1 g in sodium chloride 0.9 % 100 mL IVPB  1 g Intravenous Q24H Hallaji, Dani Gobble, RPH   Stopped at 10/10/18 0900  . Chlorhexidine Gluconate Cloth 2 % PADS 6 each  6 each Topical Q0600 Lateef, Munsoor, MD      . cinacalcet (SENSIPAR) tablet 60 mg  60 mg Oral Q breakfast Salary, Montell D, MD      . Derrill Memo ON 10/11/2018] epoetin alfa (EPOGEN,PROCRIT) injection 4,000 Units  4,000 Units Intravenous Q T,Th,Sa-HD Holley Raring, Munsoor, MD   4,000 Units at 10/10/18 1624  . feeding supplement (NEPRO CARB STEADY) liquid 237 mL  237 mL Oral BID BM Salary, Montell D, MD   237 mL at 10/10/18 1342  . feeding supplement (PRO-STAT SUGAR FREE 64) liquid 30 mL  30 mL Oral Daily Salary, Montell D, MD   30 mL at 10/10/18 0818  . ferrous sulfate tablet 325 mg  325 mg Oral TID WC Salary, Montell D, MD   325 mg at 10/10/18 1225  . glipiZIDE (GLUCOTROL) tablet 10 mg  10 mg Oral Daily Salary, Montell D, MD   10 mg at 10/10/18 0834  . guaiFENesin-dextromethorphan (ROBITUSSIN DM) 100-10 MG/5ML syrup 5 mL  5 mL Oral Q4H PRN Salary, Montell D, MD   5 mL at 10/10/18 1342  . heparin injection 5,000 Units  5,000 Units Subcutaneous Q8H Salary, Montell D, MD   5,000 Units at 10/10/18 1229  . hydrALAZINE (APRESOLINE) injection 10 mg  10 mg Intravenous Q4H PRN Salary, Montell D, MD      . insulin aspart (novoLOG) injection 0-15 Units  0-15 Units Subcutaneous TID WC Salary, Montell D, MD      . lactulose (CHRONULAC) 10 GM/15ML solution 10 g  10 g Oral Daily Salary, Montell D, MD   10 g at 10/10/18 0823  . lidocaine-prilocaine (EMLA) cream 1 application  1 application Topical PRN Salary, Montell D, MD      . metroNIDAZOLE (FLAGYL) IVPB 500 mg  500 mg Intravenous Q8H Harvest Dark, MD   Stopped at 10/10/18 1124  . midodrine (PROAMATINE) tablet 10 mg  10 mg Oral TID WC Salary, Montell D, MD   10 mg at 10/10/18 1225  . multivitamin (RENA-VIT) tablet 1 tablet  1 tablet Oral QHS Salary, Holly Bodily D, MD   1 tablet at 10/09/18 2319  .  ondansetron (ZOFRAN) tablet 4 mg  4 mg Oral Q6H PRN Salary, Montell D, MD       Or  . ondansetron (ZOFRAN) injection 4 mg  4 mg Intravenous Q6H PRN Salary, Montell D, MD      . oxyCODONE (Oxy IR/ROXICODONE) immediate release tablet 5 mg  5 mg Oral Q6H PRN Salary, Montell D, MD      . pantoprazole (PROTONIX) EC tablet 40 mg  40 mg Oral Daily Salary, Montell D, MD   40 mg at 10/10/18 0824  . polyethylene glycol (  MIRALAX / GLYCOLAX) packet 17 g  17 g Oral BID Loney Hering D, MD   17 g at 10/10/18 0818  . senna-docusate (Senokot-S) tablet 1 tablet  1 tablet Oral BID Gorden Harms, MD   1 tablet at 10/10/18 0824  . sevelamer carbonate (RENVELA) tablet 2,400 mg  2,400 mg Oral TID WC Salary, Montell D, MD   2,400 mg at 10/10/18 1225  . vancomycin (VANCOCIN) IVPB 1000 mg/200 mL premix  1,000 mg Intravenous Once Hallaji, Dani Gobble, RPH      . vancomycin (VANCOCIN) IVPB 1000 mg/200 mL premix  1,000 mg Intravenous Q M,W,F-HD Pernell Dupre, RPH   Stopped at 10/09/18 1147     Discharge Medications: Please see discharge summary for a list of discharge medications.  Relevant Imaging Results:  Relevant Lab Results:   Additional Information    Berkley Cronkright  Louretta Shorten, LCSWA

## 2018-10-10 NOTE — Consult Note (Signed)
[facility] Nurse wound consult note Patient receiving care in [facility].  No family present. Reason for Consult: Wound care Wound type: Right stump has a small area of slight dehiscence at the medial border.  No drainage expressed, no induration noted, no erythema. Plan of care:  Xeroform to incision line, covered with dry gauze, tape in place.  Change daily. Monitor the wound area(s) for worsening of condition such as: Signs/symptoms of infection,  Increase in size,  Development of or worsening of odor, Development of pain, or increased pain at the affected locations.  Notify the medical team if any of these develop.  Thank you for the consult.  Discussed plan of care with the bedside nurse.  [facility] nurse will not follow at this time.  Please re-consult the [place] team if needed.  Val Riles, RN, MSN, CWOCN, CNS-BC, pager [phone]

## 2018-10-11 DIAGNOSIS — J810 Acute pulmonary edema: Secondary | ICD-10-CM

## 2018-10-11 LAB — GLUCOSE, CAPILLARY
Glucose-Capillary: 156 mg/dL — ABNORMAL HIGH (ref 70–99)
Glucose-Capillary: 154 mg/dL — ABNORMAL HIGH (ref 70–99)

## 2018-10-11 MED ORDER — METRONIDAZOLE 500 MG PO TABS: 500.0000 mg | ORAL_TABLET | Freq: Three times a day (TID) | ORAL | 0 refills | Status: DC

## 2018-10-11 MED ORDER — LACTULOSE 10 GM/15ML PO SOLN: 20.0000 g | Freq: Two times a day (BID) | ORAL | 1 refills | Status: DC | PRN

## 2018-10-11 MED ORDER — ALBUTEROL SULFATE 0.63 MG/3ML IN NEBU: 1.0000 | INHALATION_SOLUTION | RESPIRATORY_TRACT | 3 refills | Status: DC | PRN

## 2018-10-11 MED ORDER — LACTULOSE 20 G PO PACK: 20.0000 g | PACK | Freq: Three times a day (TID) | ORAL | 0 refills | Status: DC

## 2018-10-11 MED ORDER — CEFDINIR 300 MG PO CAPS: 300.0000 mg | ORAL_CAPSULE | Freq: Two times a day (BID) | ORAL | 0 refills | Status: DC

## 2018-10-11 NOTE — Progress Notes (Signed)
Subjective/Chief Complaint: Doing OK. Denies pain in stump.   Objective: Vital signs in last 24 hours: Temp:  [98.2 F (36.8 C)-99.1 F (37.3 C)] 98.7 F (37.1 C) (12/28 0523) Pulse Rate:  [72-130] 80 (12/28 0523) Resp:  [12-31] 16 (12/28 0523) BP: (95-149)/(48-88) 111/61 (12/28 0523) SpO2:  [96 %-100 %] 100 % (12/28 0741) Weight:  [103.7 kg-107.1 kg] 103.7 kg (12/27 1849) Last BM Date: 10/10/18  Intake/Output from previous day: 12/27 0701 - 12/28 0700 In: 808 [P.O.:360; I.V.:48; IV Piggyback:400] Out: 3528  Intake/Output this shift: No intake/output data recorded.  General appearance: alert and no distress Extremities: RIGHT AKA stump- no drainage from stump, erythema improved. incisional skin sloughing and necrosis-improved. nontender. betadine paint and dry dresing applied  Lab Results:  Recent Labs    10/09/18 0651 10/10/18 0847  WBC 19.5* 12.0*  HGB 9.2* 8.9*  HCT 31.0* 30.7*  PLT 469* 387   BMET Recent Labs    10/09/18 0651 10/10/18 0847  NA 135 139  K 5.8* 4.4  CL 93* 98  CO2 25 27  GLUCOSE 247* 197*  BUN 76* 50*  CREATININE 8.40* 5.57*  CALCIUM 8.8* 9.1   PT/INR Recent Labs    10/10/18 0403  LABPROT 16.2*  INR 1.32   ABG No results for input(s): PHART, HCO3 in the last 72 hours.  Invalid input(s): PCO2, PO2  Studies/Results: US Venous Img Lower Unilateral Right  Result Date: 10/09/2018 CLINICAL DATA:  Right lower extremity pain. Right above the knee amputation. EXAM: RIGHT LOWER EXTREMITY VENOUS DUPLEX ULTRASOUND TECHNIQUE: Doppler venous assessment of the right lower extremity deep venous system was performed, including characterization of spectral flow, compressibility, and phasicity. COMPARISON:  None. FINDINGS: There is complete compressibility of the common femoral and femoral veins. Doppler analysis demonstrates respiratory phasicity. There is no obvious superficial vein thrombosis. IMPRESSION: No evidence of DVT in the right  lower extremity. Electronically Signed   By: Marybelle Killings M.D.   On: 10/09/2018 16:53    Anti-infectives: Anti-infectives (From admission, onward)   Start     Dose/Rate Route Frequency Ordered Stop   10/10/18 1200  vancomycin (VANCOCIN) IVPB 1000 mg/200 mL premix     1,000 mg 200 mL/hr over 60 Minutes Intravenous Every M-W-F (Hemodialysis) 10/09/18 1017     10/10/18 0800  ceFEPIme (MAXIPIME) 1 g in sodium chloride 0.9 % 100 mL IVPB     1 g 200 mL/hr over 30 Minutes Intravenous Every 24 hours 10/09/18 1017     10/09/18 1745  ceFEPIme (MAXIPIME) 2 g in sodium chloride 0.9 % 100 mL IVPB  Status:  Discontinued     2 g 200 mL/hr over 30 Minutes Intravenous  Once 10/09/18 1731 10/09/18 1734   10/09/18 1745  metroNIDAZOLE (FLAGYL) IVPB 500 mg  Status:  Discontinued     500 mg 100 mL/hr over 60 Minutes Intravenous Every 8 hours 10/09/18 1731 10/09/18 1734   10/09/18 1745  vancomycin (VANCOCIN) IVPB 1000 mg/200 mL premix  Status:  Discontinued     1,000 mg 200 mL/hr over 60 Minutes Intravenous  Once 10/09/18 1731 10/09/18 1734   10/09/18 1045  vancomycin (VANCOCIN) IVPB 1000 mg/200 mL premix     1,000 mg 200 mL/hr over 60 Minutes Intravenous  Once 10/09/18 0957     10/09/18 0745  ceFEPIme (MAXIPIME) 2 g in sodium chloride 0.9 % 100 mL IVPB     2 g 200 mL/hr over 30 Minutes Intravenous  Once 10/09/18 0736 10/09/18 0846  10/09/18 0745  metroNIDAZOLE (FLAGYL) IVPB 500 mg     500 mg 100 mL/hr over 60 Minutes Intravenous Every 8 hours 10/09/18 0736     10/09/18 0745  vancomycin (VANCOCIN) IVPB 1000 mg/200 mL premix     1,000 mg 200 mL/hr over 60 Minutes Intravenous  Once 10/09/18 0736 10/09/18 1037      Assessment/Plan: s/p AKA OK for discharge from Vascular standpoint. Daily dressing changes and local wound care.  LOS: 2 days    Evaristo Bury 10/11/2018

## 2018-10-11 NOTE — Progress Notes (Signed)
Decreased back to 3l after neb treatment

## 2018-10-11 NOTE — Discharge Summary (Signed)
[facility] NAME: [name]    MR#:  [id number]  DATE OF BIRTH:  1961/05/20  DATE OF ADMISSION:  10/09/2018 ADMITTING PHYSICIAN: Munsoor Lateef, MD  DATE OF DISCHARGE: No discharge date for patient encounter.  PRIMARY CARE PHYSICIAN: Revelo, Elyse Jarvis, MD    ADMISSION DIAGNOSIS:  Acute pulmonary edema ([place]) [J81.0] SOB (shortness of breath) [R06.02] DVT (deep venous thrombosis) (HCC) [I82.409] Pain of right lower extremity [M79.604] Sepsis, due to unspecified organism, unspecified whether acute organ dysfunction present ([place]) [A41.9]  DISCHARGE DIAGNOSIS:  Active Problems:   Sepsis ([place])   SECONDARY DIAGNOSIS:   Past Medical History:  Diagnosis Date  . (HFpEF) heart failure with preserved ejection fraction ([place])    a. 2017 EF per LHC 30-35%; b. 09/2018 Echo: EF 55-60%, no rwma, Gr2 DD, mild MR, mildly dil LA. PASP 10mmHg. small to mod circumferential pericardial eff, no tamponade.  . Anemia of chronic disease   . Atherosclerosis   . CAD (coronary artery disease)    a. 2017 LHC, severe 1v disease, occluded pRCA, L-R collaterals, moderate LV dysfunction EF 30-35.  . Diabetes mellitus with complication ([place])   . End stage renal disease on dialysis ([place])    Labile BP  . History of ventricular tachycardia    a. 2017 polymorphic VT and cardiac arrest.  . Hypercholesteremia   . Hyperlipidemia LDL goal <70   . Hypertension   . Labile blood pressure    on HD  . NSVT (nonsustained ventricular tachycardia) ([place])    08/2018 NSVT  . PAF (paroxysmal atrial fibrillation) ([place])    a. 08/2018 CHA2DS2VASc at least 6-->initially placed on eliquis but d/c'd 2/2 anemia. On Amio.  Marland Kitchen Peripheral arterial occlusive disease (HCC)    s/p L BKA, R AKA  . S/P AKA (above knee amputation) unilateral, right ([place])    08/2018  . S/P BKA (below knee amputation) unilateral, left ([place])    2015  . Stroke [facility])     HOSPITAL  COURSE:  *Acute sepsis Resolved Exact etiology unknown as source-?Right AKA stump with noted serosanguineous drainage Treated on our sepsis protocol, provided cefepime/Flagyl/vancomycin, cultures negative to date/at time of discharge  *Acute systolic congestive heart failure exacerbation/fluid overload state Resolved status post hemodialysis 2 days in a row Secondary to end-stage renal disease Nephrology did see patient while in house  *Acute hyperkalemia Resolved with hemodialysis  *Chronic end-stage renal disease Neurology did see patient while in house for hemodialysis needs  *Acute recent right AKA Stable S/p stable removal, vascular surgery did see patient while in house, right lower extremity negative for DVT  Wound care consulted given small area of skin ulceration with serosanguineous drainage-dry dressing changes daily  *Chronic paroxysmal A. Fib Stable Continued aspirin, amiodarone  *Chronic diabetes mellitus type 2 Controlled on current regiment   DISCHARGE CONDITIONS:   stable  CONSULTS OBTAINED:  Treatment Team:  Evaristo Bury, MD Wellington Hampshire, MD  DRUG ALLERGIES:  No Known Allergies  DISCHARGE MEDICATIONS:   Allergies as of 10/11/2018   No Known Allergies     Medication List    TAKE these medications   acetaminophen 325 MG tablet Commonly known as:  TYLENOL Take 650 mg by mouth every 6 (six) hours as needed.   albuterol 0.63 MG/3ML nebulizer solution Commonly known as:  ACCUNEB Take 1 ampule by nebulization every 4 (four) hours as needed for wheezing.   amiodarone 200 MG tablet Commonly known as:  PACERONE Take 1 tablet (200 mg total) by mouth 2 (two) times daily.   aspirin EC 81 MG tablet Take 81 mg by mouth daily.   b complex-vitamin c-folic acid 0.8 MG Tabs tablet Take 1 tablet by mouth daily.   cefdinir 300 MG capsule Commonly known as:  OMNICEF Take 1 capsule (300 mg total) by mouth 2 (two) times daily.    cinacalcet 60 MG tablet Commonly known as:  SENSIPAR Take 60 mg by mouth daily.   feeding supplement (NEPRO CARB STEADY) Liqd Take 237 mLs by mouth 2 (two) times daily between meals.   feeding supplement (PRO-STAT SUGAR FREE 64) Liqd Take 30 mLs by mouth daily.   ferrous sulfate 325 (65 FE) MG EC tablet Take 325 mg by mouth 3 (three) times daily with meals.   glipiZIDE 10 MG tablet Commonly known as:  GLUCOTROL Take 10 mg by mouth daily.   lactulose 10 GM/15ML solution Commonly known as:  CHRONULAC Take 10 g by mouth daily. What changed:  Another medication with the same name was added. Make sure you understand how and when to take each.   lactulose 20 g packet Commonly known as:  CEPHULAC Take 1 packet (20 g total) by mouth 3 (three) times daily. What changed:  You were already taking a medication with the same name, and this prescription was added. Make sure you understand how and when to take each.   lactulose 10 GM/15ML solution Commonly known as:  CHRONULAC Take 30 mLs (20 g total) by mouth 2 (two) times daily as needed for mild constipation. What changed:  You were already taking a medication with the same name, and this prescription was added. Make sure you understand how and when to take each.   lidocaine-prilocaine cream Commonly known as:  EMLA Apply 1 application topically as needed (port access).   metroNIDAZOLE 500 MG tablet Commonly known as:  FLAGYL Take 1 tablet (500 mg total) by mouth 3 (three) times daily.   midodrine 10 MG tablet Commonly known as:  PROAMATINE Take 1 tablet (10 mg total) by mouth 3 (three) times daily with meals.   omeprazole 40 MG capsule Commonly known as:  PRILOSEC Take 40 mg by mouth 2 (two) times daily.   oxyCODONE 5 MG immediate release tablet Commonly known as:  Oxy IR/ROXICODONE Take 1 tablet (5 mg total) by mouth every 6 (six) hours as needed for moderate pain or severe pain.   polyethylene glycol packet Commonly  known as:  MIRALAX / GLYCOLAX Take 17 g by mouth 2 (two) times daily.   senna-docusate 8.6-50 MG tablet Commonly known as:  Senokot-S Take 1 tablet by mouth 2 (two) times daily.   sevelamer carbonate 800 MG tablet Commonly known as:  RENVELA Take 2,400 mg by mouth 3 (three) times daily with meals.            Durable Medical Equipment  (From admission, onward)         Start     Ordered   10/11/18 1148  For home use only DME Wheelchair electric  Educational psychologist)  Once    Comments:  Bilateral lower extremity amputee, chronic deconditioning   10/11/18 1150   10/11/18 0000  DME Nebulizer machine    Question Answer Comment  Patient needs a nebulizer to treat with the following condition Respiratory failure ([place])   Patient needs a nebulizer to treat with the following condition Heart failure ([place])      10/11/18 1150  DISCHARGE INSTRUCTIONS:      If you experience worsening of your admission symptoms, develop shortness of breath, life threatening emergency, suicidal or homicidal thoughts you must seek medical attention immediately by calling 911 or calling your MD immediately  if symptoms less severe.  You Must read complete instructions/literature along with all the possible adverse reactions/side effects for all the Medicines you take and that have been prescribed to you. Take any new Medicines after you have completely understood and accept all the possible adverse reactions/side effects.   Please note  You were cared for by a hospitalist during your hospital stay. If you have any questions about your discharge medications or the care you received while you were in the hospital after you are discharged, you can call the unit and asked to speak with the hospitalist on call if the hospitalist that took care of you is not available. Once you are discharged, your primary care physician will handle any further medical issues. Please note that NO REFILLS for any discharge  medications will be authorized once you are discharged, as it is imperative that you return to your primary care physician (or establish a relationship with a primary care physician if you do not have one) for your aftercare needs so that they can reassess your need for medications and monitor your lab values.    Today   CHIEF COMPLAINT:   Chief Complaint  Patient presents with  . Shortness of Breath    HISTORY OF PRESENT ILLNESS:   57 y.o. male with a known history per below status post recent right AKA by vascular surgery, presenting from nursing home with shortness of breath, anxious appearing, noted hypoxia with O2 saturation 88% on room air, patient complaining of cough, fever, in the emergency room patient was noted to be febrile, tachycardic, hypoxic, white count 19,000, chest x-ray noted for fluid overload/edema, BNP greater than 3700, potassium 5.8, creatinine 8.4, sepsis protocol initiated, patient evaluated in the emergency room, noted to be in mild distress due to shortness of breath from acute fluid overload, nephrology planning for hemodialysis later today, patient with right stump serosanguineous drainage/staples still in place, patient now be admitted for acute sepsis, acute hypoxic respiratory failure secondary to acute fluid overload/congestive heart failure exacerbation.  VITAL SIGNS:  Blood pressure 111/61, pulse 80, temperature 98.7 F (37.1 C), temperature source Oral, resp. rate 16, height 5\' 7"  (1.702 m), weight 103.7 kg, SpO2 100 %.  I/O:    Intake/Output Summary (Last 24 hours) at 10/11/2018 1154 Last data filed at 10/11/2018 2202 Gross per 24 hour  Intake 687.97 ml  Output 3528 ml  Net -2840.03 ml    PHYSICAL EXAMINATION:  GENERAL:  57 y.o.-year-old patient lying in the bed with no acute distress.  EYES: Pupils equal, round, reactive to light and accommodation. No scleral icterus. Extraocular muscles intact.  HEENT: Head atraumatic, normocephalic.  Oropharynx and nasopharynx clear.  NECK:  Supple, no jugular venous distention. No thyroid enlargement, no tenderness.  LUNGS: Normal breath sounds bilaterally, no wheezing, rales,rhonchi or crepitation. No use of accessory muscles of respiration.  CARDIOVASCULAR: S1, S2 normal. No murmurs, rubs, or gallops.  ABDOMEN: Soft, non-tender, non-distended. Bowel sounds present. No organomegaly or mass.  EXTREMITIES: No pedal edema, cyanosis, or clubbing.  NEUROLOGIC: Cranial nerves II through XII are intact. Muscle strength 5/5 in all extremities. Sensation intact. Gait not checked.  PSYCHIATRIC: The patient is alert and oriented x 3.  SKIN: No obvious rash, lesion, or ulcer.  DATA REVIEW:   CBC Recent Labs  Lab 10/10/18 0847  WBC 12.0*  HGB 8.9*  HCT 30.7*  PLT 387    Chemistries  Recent Labs  Lab 10/10/18 0847  NA 139  K 4.4  CL 98  CO2 27  GLUCOSE 197*  BUN 50*  CREATININE 5.57*  CALCIUM 9.1    Cardiac Enzymes Recent Labs  Lab 10/09/18 0651  TROPONINI 0.05*    Microbiology Results  Results for orders placed or performed during the hospital encounter of 10/09/18  Blood Culture (routine x 2)     Status: None (Preliminary result)   Collection Time: 10/09/18  6:40 AM  Result Value Ref Range Status   Specimen Description BLOOD BLOOD RIGHT HAND  Final   Special Requests   Final    BOTTLES DRAWN AEROBIC AND ANAEROBIC Blood Culture adequate volume   Culture   Final    NO GROWTH 2 DAYS Performed at [facility], [address], [place], [place] [address]    Report Status PENDING  Incomplete  Blood Culture (routine x 2)     Status: None (Preliminary result)   Collection Time: 10/09/18  2:47 PM  Result Value Ref Range Status   Specimen Description BLOOD PORTA CATH  Final   Special Requests   Final    BOTTLES DRAWN AEROBIC AND ANAEROBIC Blood Culture adequate volume   Culture   Final    NO GROWTH 2 DAYS Performed at [facility], [address], [place], [place] [address]    Report Status PENDING  Incomplete    RADIOLOGY:  US Venous Img Lower Unilateral Right  Result Date: 10/09/2018 CLINICAL DATA:  Right lower extremity pain. Right above the knee amputation. EXAM: RIGHT LOWER EXTREMITY VENOUS DUPLEX ULTRASOUND TECHNIQUE: Doppler venous assessment of the right lower extremity deep venous system was performed, including characterization of spectral flow, compressibility, and phasicity. COMPARISON:  None. FINDINGS: There is complete compressibility of the common femoral and femoral veins. Doppler analysis demonstrates respiratory phasicity. There is no obvious superficial vein thrombosis. IMPRESSION: No evidence of DVT in the right lower extremity. Electronically Signed   By: Marybelle Killings M.D.   On: 10/09/2018 16:53    EKG:   Orders placed or performed during the hospital encounter of 10/09/18  . ED EKG within 10 minutes  . ED EKG within 10 minutes      Management plans discussed with the patient, family and they are in agreement.  CODE STATUS:     Code Status Orders  (From admission, onward)         Start     Ordered   10/09/18 1731  Full code  Continuous     10/09/18 1731        Code Status History    Date Active Date Inactive Code Status Order ID Comments User Context   09/21/2018 2044 09/28/2018 1748 Full Code [id number]  Gorden Harms, MD Inpatient   09/03/2018 2041 09/15/2018 2255 Full Code [id number]  Vaughan Basta, MD ED   08/12/2018 1629 08/14/2018 1324 Full Code [id number]  [name], MD ED   07/24/2018 1246 07/24/2018 1759 Full Code [id number]  [name], MD Inpatient   01/09/2017 1135 01/09/2017 1531 Full Code [id number]  [name], MD Inpatient   04/28/2016 1034 05/02/2016 2139 Full Code [id number]  Flora Lipps, MD Inpatient   04/25/2016 1340 04/28/2016 1034 Full Code [id number]  Schnier, Dolores Lory, MD Inpatient      TOTAL TIME  TAKING CARE OF THIS PATIENT: 40 minutes.    Avel Peace Elvera Almario  M.D on 10/11/2018 at 11:54 AM  Between 7am to 6pm - Pager - [phone]  After 6pm go to [email or web address] - password [email or web address] [facility]  Office  [phone]  CC: Primary care physician; [name], MD   Note: This dictation was prepared with Dragon dictation along with smaller phrase technology. Any transcriptional errors that result from this process are unintentional.

## 2018-10-11 NOTE — Care Management Note (Addendum)
Case Management Note  Patient Details  Name: [name] MRN: [id number] Date of Birth: [birth year]  Subjective/Objective:   Patient to be discharged per MD order. Orders in place for home health services. Patient is a bilateral amputee and lives with his spouse. DME orders for transfer board as well as DME nebulizer. Patient had hospital bed recently in [place] but was unable to get it here and insurance could not pay for another. Per request electric wheelchair ordered. Requested from [place] [facility] care with all other DME. Nebulizer and transfer board delivered to bedside. Per [facility] electric wheelchair would be obtained via PCP. I have instructed the family to call Monday am to inform PCP of need for that order, family agreeable and [facility] is anticipating that call. Instructed on use of transfer board as well. CMS Medicare.gov Compare Post Acute Care list reviewed with patient and spouse and they prefer [facility] for home health care as well. Referral confirmed with [facility] for all needs. EMS for transport. Interpreter used for all of our conversation. Notified Estill Bamberg from Patient pathways of discharge. Patient is [facility] in [place] M,W,F.                   Action/Plan:   Expected Discharge Date:  10/11/18               Expected Discharge Plan:  [place]  In-House Referral:     Discharge planning Services  CM Consult  Post Acute Care Choice:    Choice offered to:  Patient, Spouse  DME Arranged:  Nebulizer machine, Other see comment(transfer board) DME Agency:  [place]:  RN, PT, Nurse's Aide, Social Work [employer] Agency:  [place]  Status of Service:  Completed, signed off  If discussed at [employer], dates discussed:    Additional Comments:  Latanya Maudlin, RN 10/11/2018, 3:07 PM

## 2018-10-11 NOTE — Progress Notes (Signed)
[facility]  ROUNDING NOTE   Subjective:  Patient had hemodialysis yesterday. No acute indication for dialysis today. Still has a bit of a cough.   Objective:  Vital signs in last 24 hours:  Temp:  [98.2 F (36.8 C)-99.1 F (37.3 C)] 98.6 F (37 C) (12/28 1203) Pulse Rate:  [72-130] 83 (12/28 1203) Resp:  [12-31] 20 (12/28 1203) BP: (95-149)/(48-88) 148/83 (12/28 1203) SpO2:  [96 %-100 %] 98 % (12/28 1203) Weight:  [103.7 kg-107.1 kg] 103.7 kg (12/27 1849)  Weight change: -7.6 kg Filed Weights   10/09/18 1535 10/10/18 1453 10/10/18 1849  Weight: 112.2 kg 107.1 kg 103.7 kg    Intake/Output: I/O last 3 completed shifts: In: 1294.1 [P.O.:360; I.V.:192; IV Piggyback:742.1] Out: 8099 [Other:3528]   Intake/Output this shift:  No intake/output data recorded.  Physical Exam: General: No acute distress  Head: Normocephalic, atraumatic. Moist oral mucosal membranes  Eyes: Anicteric  Neck: Supple, trachea midline  Lungs:  Bilateral rales, normal effort  Heart: S1S2 no rubs  Abdomen:  Soft, nontender, bowel sounds present  Extremities: B/L LE amputations  Neurologic: Awake, alert, following commands  Skin: No lesions  Access: LUE AVF    Basic Metabolic Panel: Recent Labs  Lab 10/09/18 0651 10/09/18 1308 10/10/18 0847 10/10/18 1604  NA 135  --  139  --   K 5.8*  --  4.4  --   CL 93*  --  98  --   CO2 25  --  27  --   GLUCOSE 247*  --  197*  --   BUN 76*  --  50*  --   CREATININE 8.40*  --  5.57*  --   CALCIUM 8.8*  --  9.1  --   PHOS  --  3.0  --  3.6    Liver Function Tests: No results for input(s): AST, ALT, ALKPHOS, BILITOT, PROT, ALBUMIN in the last 168 hours. No results for input(s): LIPASE, AMYLASE in the last 168 hours. No results for input(s): AMMONIA in the last 168 hours.  CBC: Recent Labs  Lab 10/09/18 0651 10/10/18 0847  WBC 19.5* 12.0*  NEUTROABS  --  9.3*  HGB 9.2* 8.9*  HCT 31.0* 30.7*  MCV 96.6 98.1  PLT 469* 387     Cardiac Enzymes: Recent Labs  Lab 10/09/18 0651  TROPONINI 0.05*    BNP: Invalid input(s): POCBNP  CBG: Recent Labs  Lab 10/10/18 1947 10/11/18 0731 10/11/18 1135  GLUCAP 138* 156* 154*    Microbiology: Results for orders placed or performed during the hospital encounter of 10/09/18  Blood Culture (routine x 2)     Status: None (Preliminary result)   Collection Time: 10/09/18  6:40 AM  Result Value Ref Range Status   Specimen Description BLOOD BLOOD RIGHT HAND  Final   Special Requests   Final    BOTTLES DRAWN AEROBIC AND ANAEROBIC Blood Culture adequate volume   Culture   Final    NO GROWTH 2 DAYS Performed at [facility], [address] [place], [place] [address]    Report Status PENDING  Incomplete  Blood Culture (routine x 2)     Status: None (Preliminary result)   Collection Time: 10/09/18  2:47 PM  Result Value Ref Range Status   Specimen Description BLOOD PORTA CATH  Final   Special Requests   Final    BOTTLES DRAWN AEROBIC AND ANAEROBIC Blood Culture adequate volume   Culture   Final    NO GROWTH  2 DAYS Performed at [facility], [address] [place], [place] [address]    Report Status PENDING  Incomplete    Coagulation Studies: Recent Labs    10/10/18 0403  LABPROT 16.2*  INR 1.32    Urinalysis: No results for input(s): COLORURINE, LABSPEC, PHURINE, GLUCOSEU, HGBUR, BILIRUBINUR, KETONESUR, PROTEINUR, UROBILINOGEN, NITRITE, LEUKOCYTESUR in the last 72 hours.  Invalid input(s): APPERANCEUR    Imaging: US Venous Img Lower Unilateral Right  Result Date: 10/09/2018 CLINICAL DATA:  Right lower extremity pain. Right above the knee amputation. EXAM: RIGHT LOWER EXTREMITY VENOUS DUPLEX ULTRASOUND TECHNIQUE: Doppler venous assessment of the right lower extremity deep venous system was performed, including characterization of spectral flow, compressibility, and phasicity. COMPARISON:  None. FINDINGS: There is complete  compressibility of the common femoral and femoral veins. Doppler analysis demonstrates respiratory phasicity. There is no obvious superficial vein thrombosis. IMPRESSION: No evidence of DVT in the right lower extremity. Electronically Signed   By: Marybelle Killings M.D.   On: 10/09/2018 16:53     Medications:   . sodium chloride Stopped (10/11/18 0547)  . ceFEPime (MAXIPIME) IV Stopped (10/10/18 0900)  . metronidazole 500 mg (10/11/18 0943)  . vancomycin 1,000 mg (10/10/18 1738)   . albuterol  2.5 mg Nebulization Q6H  . amiodarone  200 mg Oral BID  . aspirin EC  81 mg Oral Daily  . Chlorhexidine Gluconate Cloth  6 each Topical Q0600  . cinacalcet  60 mg Oral Q breakfast  . epoetin (EPOGEN/PROCRIT) injection  4,000 Units Intravenous Q T,Th,Sa-HD  . feeding supplement (NEPRO CARB STEADY)  237 mL Oral BID BM  . feeding supplement (PRO-STAT SUGAR FREE 64)  30 mL Oral Daily  . ferrous sulfate  325 mg Oral TID WC  . glipiZIDE  10 mg Oral Daily  . heparin  5,000 Units Subcutaneous Q8H  . insulin aspart  0-15 Units Subcutaneous TID WC  . lactulose  10 g Oral Daily  . midodrine  10 mg Oral TID WC  . multivitamin  1 tablet Oral QHS  . pantoprazole  40 mg Oral Daily  . polyethylene glycol  17 g Oral BID  . senna-docusate  1 tablet Oral BID  . sevelamer carbonate  2,400 mg Oral TID WC     Assessment/ Plan:  57 y.o. male Hispanic (Spanish speaking only) malewith end stage renal disease, peripheral vascular disease, hypertension, CVA, hyperlipidemia, diabetes mellitus type II, anemia of CKD, SHPTH, s/p b/l LE amputations  MWF [facility] EDW 106.5 kg Left AVF  1.  ESRD on HD MWF.  Patient completed dialysis yesterday.  No acute occasion for dialysis today.  We will plan for dialysis again on Monday if still here.  2.  Hyperkalemia.  Resolved with dialysis.  3.  Anemia chronic kidney disease.  Continue Epogen as an outpatient.  Hemoglobin 8.9 at last check.  4.  Secondary  hyperparathyroidism.  Serum phosphorus under good control at 3.6.  Maintain the patient on Renvela 3 tablets p.o. 3 times daily with meals.     LOS: 2 Lataria Courser 12/28/201912:52 PM

## 2018-10-11 NOTE — Progress Notes (Addendum)
Discharge teaching given to patient, patient verbalized understanding and had no questions. Patient IV removed. Patient will be transported home by EMS. All patient belongings gathered prior to leaving. Case management meet with family to assure all needs meet prior to discharge.

## 2018-10-14 ENCOUNTER — Ambulatory Visit (INDEPENDENT_AMBULATORY_CARE_PROVIDER_SITE_OTHER): Payer: Medicare Other | Admitting: Nurse Practitioner

## 2018-10-14 ENCOUNTER — Encounter (INDEPENDENT_AMBULATORY_CARE_PROVIDER_SITE_OTHER): Payer: Self-pay | Admitting: Nurse Practitioner

## 2018-10-14 ENCOUNTER — Telehealth (INDEPENDENT_AMBULATORY_CARE_PROVIDER_SITE_OTHER): Payer: Self-pay

## 2018-10-14 VITALS — BP 136/78 | HR 81

## 2018-10-14 DIAGNOSIS — Z992 Dependence on renal dialysis: Secondary | ICD-10-CM

## 2018-10-14 DIAGNOSIS — N186 End stage renal disease: Secondary | ICD-10-CM

## 2018-10-14 DIAGNOSIS — E1152 Type 2 diabetes mellitus with diabetic peripheral angiopathy with gangrene: Secondary | ICD-10-CM

## 2018-10-14 DIAGNOSIS — T879 Unspecified complications of amputation stump: Secondary | ICD-10-CM

## 2018-10-14 LAB — CULTURE, BLOOD (ROUTINE X 2)
Culture: NO GROWTH
Culture: NO GROWTH
Special Requests: ADEQUATE
Special Requests: ADEQUATE

## 2018-10-14 MED ORDER — SILVER SULFADIAZINE 1 % EX CREA: 1.0000 "application " | TOPICAL_CREAM | Freq: Every day | CUTANEOUS | 0 refills | Status: DC

## 2018-10-14 NOTE — Telephone Encounter (Signed)
Amy Pope from [facility] called stating they were admitting the patient in under their care and wanted to get clarification on his wound care. Per Eulogio Ditch NP, use silvadene cream and gauze dressing daily with wound monitoring. If the patient's wound starts to dehisce , get necrotic tissue, drainage or a foul smell to call.

## 2018-10-17 ENCOUNTER — Telehealth: Payer: Self-pay | Admitting: Licensed Clinical Social Worker

## 2018-10-17 NOTE — Telephone Encounter (Signed)
CSW utilized the hospital interpreter to speak with patient via phone in order to follow up regarding an EMMI call he received. Patient had chosen option that he was feeling sad. When on the phone with the interpreter and CSW, patient stated he does not know what happened and that he is feeling fine and has no concerns. He denied having any thoughts of self harm or suicidal ideation.   He asked about the electric wheelchair and CSW advised he follow up with [facility] (he stated that he has their number) to see where they are in the process of ordering it for him.  Shela Leff MSW,LcSW [phone]

## 2018-10-20 ENCOUNTER — Encounter (INDEPENDENT_AMBULATORY_CARE_PROVIDER_SITE_OTHER): Payer: Self-pay | Admitting: Nurse Practitioner

## 2018-10-20 DIAGNOSIS — T879 Unspecified complications of amputation stump: Secondary | ICD-10-CM | POA: Insufficient documentation

## 2018-10-20 NOTE — Progress Notes (Signed)
Subjective:    Patient ID: [name], male    DOB: [birth year], 59 y.o.   MRN: [id number] Chief Complaint  Patient presents with  . Follow-up    HPI  [name] is a 58 y.o. male that presents today for evaluation of his right lower extremity above-the-knee amputation.  Patient has had several recent visits to [facility] emergency room with concern for sepsis, however they have not yet been able to pinpoint a cause of infection.  During the stay he is given antibiotics and was most recently discharged with a prescription for antibiotics.  The patient's wife is requesting more antibiotics today.  I have instructed her that currently without signs symptoms of infection I will not be able to prescribe antibiotics due to the fact that continued use of antibiotics could potentially be harmful for the patient.  During his most recent stay his staples were removed from his amputation.  There are not copious amounts of drainage or foul-smelling drainage.  There is some serosanguineous drainage on the dressings.  There is also fibrinous exudate as well as areas of scabbing.  The medial and lateral edges are somewhat concerning for dehiscence.  The patient and wife state that home health should be coming out today for continued wound care.  This visit was also conducted with an interpreter.  Past Medical History:  Diagnosis Date  . (HFpEF) heart failure with preserved ejection fraction ([place])    a. 2017 EF per LHC 30-35%; b. 09/2018 Echo: EF 55-60%, no rwma, Gr2 DD, mild MR, mildly dil LA. PASP 37mmHg. small to mod circumferential pericardial eff, no tamponade.  . Anemia of chronic disease   . Atherosclerosis   . CAD (coronary artery disease)    a. 2017 LHC, severe 1v disease, occluded pRCA, L-R collaterals, moderate LV dysfunction EF 30-35.  . Diabetes mellitus with complication ([place])   . End stage renal disease on dialysis ([place])    Labile BP  . History of ventricular  tachycardia    a. 2017 polymorphic VT and cardiac arrest.  . Hypercholesteremia   . Hyperlipidemia LDL goal <70   . Hypertension   . Labile blood pressure    on HD  . NSVT (nonsustained ventricular tachycardia) ([place])    08/2018 NSVT  . PAF (paroxysmal atrial fibrillation) ([place])    a. 08/2018 CHA2DS2VASc at least 6-->initially placed on eliquis but d/c'd 2/2 anemia. On Amio.  Marland Kitchen Peripheral arterial occlusive disease (HCC)    s/p L BKA, R AKA  . S/P AKA (above knee amputation) unilateral, right ([place])    08/2018  . S/P BKA (below knee amputation) unilateral, left ([place])    2015  . Stroke [facility])     Past Surgical History:  Procedure Laterality Date  . A/V FISTULAGRAM Left 01/09/2017   Procedure: A/V Fistulagram;  Surgeon: [name], MD;  Location: [place] [facility];  Service: Cardiovascular;  Laterality: Left;  . A/V FISTULAGRAM Left 06/06/2017   Procedure: A/V Fistulagram;  Surgeon: [name], MD;  Location: [place] [facility];  Service: Cardiovascular;  Laterality: Left;  . A/V FISTULAGRAM Left 08/15/2017   Procedure: A/V Fistulagram;  Surgeon: [name], MD;  Location: [place] [facility];  Service: Cardiovascular;  Laterality: Left;  . A/V FISTULAGRAM Left 01/22/2018   Procedure: A/V FISTULAGRAM;  Surgeon: [name], MD;  Location: [place] [facility];  Service: Cardiovascular;  Laterality: Left;  . A/V SHUNT INTERVENTION N/A 06/06/2017  Procedure: A/V SHUNT INTERVENTION;  Surgeon: [name], MD;  Location: [place] [facility];  Service: Cardiovascular;  Laterality: N/A;  . AMPUTATION Right 04/25/2016   Procedure: TOE AMPUTATION;  Surgeon: Katha Cabal, MD;  Location: [facility];  Service: Vascular;  Laterality: Right;  . AMPUTATION Right 09/09/2018   Procedure: AMPUTATION ABOVE KNEE;  Surgeon: [name], MD;  Location: [facility];  Service: Vascular;  Laterality: Right;  . APPLICATION OF WOUND VAC Right 04/25/2016   Procedure: APPLICATION OF WOUND VAC;  Surgeon:  Katha Cabal, MD;  Location: [facility];  Service: Vascular;  Laterality: Right;  . AV FISTULA PLACEMENT Left 2015  . CARDIAC CATHETERIZATION N/A 04/30/2016   Procedure: Left Heart Cath and Coronary Angiography;  Surgeon: Wellington Hampshire, MD;  Location: [place] [facility];  Service: Cardiovascular;  Laterality: N/A;  . CATARACT EXTRACTION, BILATERAL    . LEG AMPUTATION BELOW KNEE Left   . LOWER EXTREMITY ANGIOGRAPHY Right 07/24/2018   Procedure: LOWER EXTREMITY ANGIOGRAPHY;  Surgeon: [name], MD;  Location: [place] [facility];  Service: Cardiovascular;  Laterality: Right;  . LOWER EXTREMITY ANGIOGRAPHY Right 08/13/2018   Procedure: LOWER EXTREMITY ANGIOGRAPHY;  Surgeon: [name], MD;  Location: [place] [facility];  Service: Cardiovascular;  Laterality: Right;  . LOWER EXTREMITY ANGIOGRAPHY Right 09/04/2018   Procedure: Lower Extremity Angiography;  Surgeon: [name], MD;  Location: [place] [facility];  Service: Cardiovascular;  Laterality: Right;  . PERIPHERAL VASCULAR CATHETERIZATION N/A 10/03/2015   Procedure: A/V Shuntogram/Fistulagram;  Surgeon: [name], MD;  Location: [place] [facility];  Service: Cardiovascular;  Laterality: N/A;  . PERIPHERAL VASCULAR CATHETERIZATION N/A 01/16/2016   Procedure: Abdominal Aortogram w/Lower Extremity;  Surgeon: [name], MD;  Location: [place] [facility];  Service: Cardiovascular;  Laterality: N/A;  . PERIPHERAL VASCULAR CATHETERIZATION  01/16/2016   Procedure: Lower Extremity Intervention;  Surgeon: [name], MD;  Location: [place] [facility];  Service: Cardiovascular;;  . PERIPHERAL VASCULAR CATHETERIZATION Right 04/27/2016   Procedure: Lower Extremity Angiography;  Surgeon: Katha Cabal, MD;  Location: [place] [facility];  Service: Cardiovascular;  Laterality: Right;  . PERIPHERAL VASCULAR CATHETERIZATION  04/27/2016   Procedure: Lower Extremity Intervention;  Surgeon: Katha Cabal, MD;  Location: [place] [facility];  Service: Cardiovascular;;  . TRANSMETATARSAL AMPUTATION Right 04/28/2016   Procedure: TRANSMETATARSAL AMPUTATION;  Surgeon: Albertine Patricia, DPM;  Location: [facility];  Service: Podiatry;  Laterality: Right;  . WOUND DEBRIDEMENT Right 04/25/2016   Procedure: DEBRIDEMENT WOUND;  Surgeon: Katha Cabal, MD;  Location: [facility];  Service: Vascular;  Laterality: Right;    Social History   Socioeconomic History  . Marital status: Married    Spouse name: Not on file  . Number of children: Not on file  . Years of education: Not on file  . Highest education level: Not on file  Occupational History  . Not on file  Social Needs  . Financial resource strain: Not on file  . Food insecurity:    Worry: Patient refused    Inability: Patient refused  . Transportation needs:    Medical: No    Non-medical: No  Tobacco Use  . Smoking status: Never Smoker  . Smokeless tobacco: Never Used  Substance and Sexual Activity  . Alcohol use: No  . Drug use: No  . Sexual activity: Not on file  Lifestyle  . Physical activity:    Days  per week: Not on file    Minutes per session: Not on file  . Stress: Not at all  Relationships  . Social connections:    Talks on phone: More than three times a week    Gets together: Three times a week    Attends religious service: Not on file    Active member of club or organization: Not on file    Attends meetings of clubs or organizations: Not on file    Relationship status: Not on file  . Intimate partner violence:    Fear of current or ex partner: No    Emotionally abused: Not on file    Physically abused: Not on file    Forced sexual activity: Not on file  Other Topics Concern  . Not on file  Social History Narrative  . Not on file    Family History  Problem Relation Age of Onset  . Diabetes Father   . Prostate cancer Father     No Known Allergies   Review of Systems   Review of Systems: Negative Unless  Checked Constitutional: [] Weight loss  [] Fever  [] Chills Cardiac: [] Chest pain   []  Atrial Fibrillation  [] Palpitations   [] Shortness of breath when laying flat   [] Shortness of breath with exertion. [] Shortness of breath at rest Vascular:  [] Pain in legs with walking   [] Pain in legs with standing [] Pain in legs when laying flat   [] Claudication    [] Pain in feet when laying flat    [] History of DVT   [] Phlebitis   [] Swelling in legs   [] Varicose veins   [] Non-healing ulcers Pulmonary:   [] Uses home oxygen   [] Productive cough   [] Hemoptysis   [] Wheeze  [] COPD   [] Asthma Neurologic:  [] Dizziness   [] Seizures  [] Blackouts [] History of stroke   [] History of TIA  [] Aphasia   [] Temporary Blindness   [] Weakness or numbness in arm   [] Weakness or numbness in leg Musculoskeletal:   [] Joint swelling   [] Joint pain   [] Low back pain  []  History of Knee Replacement [] Arthritis [] back Surgeries  []  Spinal Stenosis    Hematologic:  [] Easy bruising  [] Easy bleeding   [] Hypercoagulable state   [x] Anemic Gastrointestinal:  [] Diarrhea   [] Vomiting  [] Gastroesophageal reflux/heartburn   [] Difficulty swallowing. [] Abdominal pain Genitourinary:  [] Chronic kidney disease   [] Difficult urination  [x] Anuric   [] Blood in urine [] Frequent urination  [] Burning with urination   [] Hematuria Skin:  [] Rashes   [] Ulcers [x] Wounds Psychological:  [] History of anxiety   []  History of major depression  []  Memory Difficulties     Objective:   Physical Exam  BP 136/78 (BP Location: Right Arm, Patient Position: Sitting)   Pulse 81   Gen: WD/WN, NAD Head: [place]/AT, No temporalis wasting.  Ear/Nose/Throat: Hearing grossly intact, nares w/o erythema or drainage Eyes: PER, EOMI, sclera nonicteric.  Neck: Supple, no masses.  No JVD.  Pulmonary:  Good air movement, no use of accessory muscles.  Cardiac: RRR Vascular: good thrill and bruit.  Staples removed during hospital stay, some fibrinous exudate in places, scabbing.   Vessel  Right Left  Radial Palpable Palpable  Gastrointestinal: soft, non-distended. No guarding/no peritoneal signs.  Musculoskeletal: bilateral LE amputations  Neurologic: Pain and light touch intact in extremities.  Symmetrical.  Speech is fluent. Motor exam as listed above. Psychiatric: Judgment intact, Mood & affect appropriate for pt's clinical situation. Dermatologic: No Venous rashes. No Ulcers Noted.  No changes consistent with cellulitis. Lymph : No  Cervical lymphadenopathy, no lichenification or skin changes of chronic lymphedema.      Assessment & Plan:   1. Complication of above knee amputation stump (HCC) Some small areas of fibrinous exudate looks scabbing at the edges.  His staples were removed at one time during a recent hospital stay.  There is some concern that the wound may dehisce.  Steri-Strips were placed near edges of concern.  Also given a prescription for sulfa Silvadene ointment to help with wound healing.  Speaking with patient and the wife we will have him come back in 2 weeks to evaluate the status of the wound.  In the meantime he will have home health, who have specific instructions on when to notify us if there are concerns with the wound.  Depending on the status of the wound debridement may be necessary with placement of wound VAC.  Patient states that this had happened last time to heal his previous amputation.  Patient and wife in agreement with plan.  2. ESRD on dialysis [facility]) Currently denies any issues with dialysis.  Good thrill and bruit, will maintain current surveillance schedule.  3. Type 2 diabetes mellitus with diabetic peripheral angiopathy with gangrene, unspecified whether long term insulin use ([place]) Continue hypoglycemic medications as already ordered, these medications have been reviewed and there are no changes at this time.  Hgb A1C to be monitored as already arranged by primary service    Current Outpatient Medications on File Prior to  Visit  Medication Sig Dispense Refill  . acetaminophen (TYLENOL) 325 MG tablet Take 650 mg by mouth every 6 (six) hours as needed.    Marland Kitchen albuterol (ACCUNEB) 0.63 MG/3ML nebulizer solution Take 3 mLs (0.63 mg total) by nebulization every 4 (four) hours as needed for wheezing. 200 mL 3  . Amino Acids-Protein Hydrolys (FEEDING SUPPLEMENT, PRO-STAT SUGAR FREE 64,) LIQD Take 30 mLs by mouth daily.    Marland Kitchen amiodarone (PACERONE) 200 MG tablet Take 1 tablet (200 mg total) by mouth 2 (two) times daily. 180 tablet 2  . aspirin EC 81 MG tablet Take 81 mg by mouth daily.    Marland Kitchen b complex-vitamin c-folic acid (NEPHRO-VITE) 0.8 MG TABS tablet Take 1 tablet by mouth daily.    . cefdinir (OMNICEF) 300 MG capsule Take 1 capsule (300 mg total) by mouth 2 (two) times daily. 8 capsule 0  . cinacalcet (SENSIPAR) 60 MG tablet Take 60 mg by mouth daily.     . ferrous sulfate 325 (65 FE) MG EC tablet Take 325 mg by mouth 3 (three) times daily with meals.    Marland Kitchen glipiZIDE (GLUCOTROL) 10 MG tablet Take 10 mg by mouth daily.     Marland Kitchen lactulose (CEPHULAC) 20 g packet Take 1 packet (20 g total) by mouth 3 (three) times daily. 30 each 0  . lidocaine-prilocaine (EMLA) cream Apply 1 application topically as needed (port access).    . metroNIDAZOLE (FLAGYL) 500 MG tablet Take 1 tablet (500 mg total) by mouth 3 (three) times daily. 12 tablet 0  . midodrine (PROAMATINE) 10 MG tablet Take 1 tablet (10 mg total) by mouth 3 (three) times daily with meals. 30 tablet 0  . Nutritional Supplements (FEEDING SUPPLEMENT, NEPRO CARB STEADY,) LIQD Take 237 mLs by mouth 2 (two) times daily between meals.  0  . omeprazole (PRILOSEC) 40 MG capsule Take 40 mg by mouth 2 (two) times daily.     Marland Kitchen oxyCODONE (OXY IR/ROXICODONE) 5 MG immediate release tablet Take 1 tablet (5 mg  total) by mouth every 6 (six) hours as needed for moderate pain or severe pain. 10 tablet 0  . polyethylene glycol (MIRALAX / GLYCOLAX) packet Take 17 g by mouth 2 (two) times daily.      Marland Kitchen senna-docusate (SENOKOT-S) 8.6-50 MG tablet Take 1 tablet by mouth 2 (two) times daily.    . sevelamer carbonate (RENVELA) 800 MG tablet Take 2,400 mg by mouth 3 (three) times daily with meals.      No current facility-administered medications on file prior to visit.     There are no Patient Instructions on file for this visit. No follow-ups on file.   Kris Hartmann, NP  This note was completed with Sales executive.  Any errors are purely unintentional.

## 2018-10-21 ENCOUNTER — Telehealth (INDEPENDENT_AMBULATORY_CARE_PROVIDER_SITE_OTHER): Payer: Self-pay

## 2018-10-21 NOTE — Telephone Encounter (Signed)
Merry Proud from [facility] called and left a message on the triage line and stated that he would like verbal orders to continue strength training and transfer training with the patient for 1 time a week for one week and 2 times a week for 3 weeks. Please advise.  [phone]

## 2018-10-22 NOTE — Telephone Encounter (Signed)
[name] is aware and he stated that they should be sending some requests for equipment soon because he is having issues getting in and out of his home, and his wife is unable to lift him.

## 2018-10-23 ENCOUNTER — Ambulatory Visit: Payer: Medicare Other | Admitting: Family

## 2018-10-23 ENCOUNTER — Telehealth (INDEPENDENT_AMBULATORY_CARE_PROVIDER_SITE_OTHER): Payer: Self-pay

## 2018-10-23 ENCOUNTER — Other Ambulatory Visit (INDEPENDENT_AMBULATORY_CARE_PROVIDER_SITE_OTHER): Payer: Self-pay | Admitting: Nurse Practitioner

## 2018-10-23 ENCOUNTER — Telehealth: Payer: Self-pay | Admitting: Family

## 2018-10-23 DIAGNOSIS — T879 Unspecified complications of amputation stump: Secondary | ICD-10-CM

## 2018-10-23 NOTE — Telephone Encounter (Signed)
[facility] on [address] per patient and his wife

## 2018-10-23 NOTE — Telephone Encounter (Signed)
[name] at [facility] called and left a message on the triage line and stated patients right amputation site is draining, has increased redness, and painful. Would like to know what should be done about this, he did finish cefdinr 8 days ago per patient.  Also asking for a refill on the silver sulfadiazine cream.   Call back is [phone]

## 2018-10-23 NOTE — Telephone Encounter (Signed)
Called patient to schedule a appointment for 10-24-2018. Patient said he could not come in tomorrow, he wanted to keep his appointment on 10-28-2018.

## 2018-10-23 NOTE — Telephone Encounter (Signed)
See below

## 2018-10-23 NOTE — Telephone Encounter (Signed)
Let's see about getting an appointment tomorrow.  No studies needed.

## 2018-10-23 NOTE — Telephone Encounter (Signed)
That's fine for the appointment, I attempted to call the nurse to see what it looks like.  I left a message for her to return the call.  Please confirm the pharmacy and I will call in a RX tomorrow.  We will see if he needs more Abx at his appointment on 10/28/2018.  If he begins to run a fever, have nausea/vomiting, or chills over the weekend he should go to the emergency room.

## 2018-10-23 NOTE — Telephone Encounter (Signed)
Patient did not show for his [facility] appointment on 10/23/2018. Will attempt to reschedule.

## 2018-10-23 NOTE — Progress Notes (Deleted)
   Patient ID: [name], male    DOB: 01/30/1961, 58 y.o.   MRN: [id number]  HPI  [name] is a 58 y/o male with a history of   Echo report from 09/27/18 reviewed and showed an EF of 55-60% along with mild Kristopher and a mildly elevated PA pressure of 41 mm Hg.   Cardiac catheterization done 04/30/16 showed:   Ost LM to LM lesion, 10% stenosed.  Ost RCA to Prox RCA lesion, 100% stenosed.  There is moderate left ventricular systolic dysfunction.   1. Severe one-vessel coronary artery disease with chronically occluded proximal right coronary artery with left to right collaterals. Minimal irregularities affecting the left coronary arteries. 2. Moderately reduced LV systolic function with an ejection fraction of 30-35% with global hypokinesis more pronounced in the basal inferior wall. 3. Moderately elevated left ventricular end-diastolic pressure.  Medication management was recommended due to good collaterals.  Admitted 10/09/18 due to acute sepsis along with acute on chronic HF. Cardiology and vascular consults were obtained. Received hemodialysis 2 days in a row. Discharged after 2 days. Admitted 09/21/18 due to sepsis. Psychiatry, palliative care, neurology, cardiology and ID consults were obtained. Initially needed IV antibiotics with plan to continue for total of 14 days. Dialysis given in hospital. Received 3 units of PRBC's due to anemia. Eliquis was stopped. Amiodarone taper due to atrial fibrillation. Discharged after 7 days.   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

## 2018-10-24 ENCOUNTER — Other Ambulatory Visit (INDEPENDENT_AMBULATORY_CARE_PROVIDER_SITE_OTHER): Payer: Self-pay | Admitting: Nurse Practitioner

## 2018-10-24 DIAGNOSIS — T879 Unspecified complications of amputation stump: Secondary | ICD-10-CM

## 2018-10-24 MED ORDER — SILVER SULFADIAZINE 1 % EX CREA: 1.0000 "application " | TOPICAL_CREAM | Freq: Every day | CUTANEOUS | 0 refills | Status: DC

## 2018-10-24 NOTE — Telephone Encounter (Signed)
It has been sent!

## 2018-10-24 NOTE — Telephone Encounter (Signed)
Patient answered and was informed. Verbalized understanding

## 2018-10-24 NOTE — Telephone Encounter (Signed)
Pt needs to be called using the interpreter line...pt is currently at Dialysis and will be done around 10:20 to call back

## 2018-10-28 ENCOUNTER — Telehealth (INDEPENDENT_AMBULATORY_CARE_PROVIDER_SITE_OTHER): Payer: Self-pay

## 2018-10-28 ENCOUNTER — Encounter (INDEPENDENT_AMBULATORY_CARE_PROVIDER_SITE_OTHER): Payer: Medicare Other

## 2018-10-28 ENCOUNTER — Ambulatory Visit (INDEPENDENT_AMBULATORY_CARE_PROVIDER_SITE_OTHER): Payer: Medicare Other | Admitting: Vascular Surgery

## 2018-10-28 ENCOUNTER — Encounter (INDEPENDENT_AMBULATORY_CARE_PROVIDER_SITE_OTHER): Payer: Self-pay | Admitting: Vascular Surgery

## 2018-10-28 VITALS — BP 113/59 | HR 88 | Resp 14

## 2018-10-28 DIAGNOSIS — Z89611 Acquired absence of right leg above knee: Secondary | ICD-10-CM

## 2018-10-28 NOTE — Progress Notes (Signed)
Patient ID: [name], male   DOB: 03/30/1961, 58 y.o.   MRN: [id number]  Chief Complaint  Patient presents with  . Follow-up    HPI [name] is a 58 y.o. male.  Patient returns in follow-up of his right AKA site.  It sounds like the patient has a difficult time getting around and traumatizes the wound quite regularly.  There is some mild separation medially with some eschar scattered throughout much of the wound.  There is some mild erythema on the medial aspect as well.  No fevers or chills.  He says it is quite painful.   Past Medical History:  Diagnosis Date  . (HFpEF) heart failure with preserved ejection fraction ([place])    a. 2017 EF per LHC 30-35%; b. 09/2018 Echo: EF 55-60%, no rwma, Gr2 DD, mild MR, mildly dil LA. PASP 55mmHg. small to mod circumferential pericardial eff, no tamponade.  . Anemia of chronic disease   . Atherosclerosis   . CAD (coronary artery disease)    a. 2017 LHC, severe 1v disease, occluded pRCA, L-R collaterals, moderate LV dysfunction EF 30-35.  . Diabetes mellitus with complication ([place])   . End stage renal disease on dialysis ([place])    Labile BP  . History of ventricular tachycardia    a. 2017 polymorphic VT and cardiac arrest.  . Hypercholesteremia   . Hyperlipidemia LDL goal <70   . Hypertension   . Labile blood pressure    on HD  . NSVT (nonsustained ventricular tachycardia) ([place])    08/2018 NSVT  . PAF (paroxysmal atrial fibrillation) ([place])    a. 08/2018 CHA2DS2VASc at least 6-->initially placed on eliquis but d/c'd 2/2 anemia. On Amio.  Marland Kitchen Peripheral arterial occlusive disease (HCC)    s/p L BKA, R AKA  . S/P AKA (above knee amputation) unilateral, right ([place])    08/2018  . S/P BKA (below knee amputation) unilateral, left ([place])    2015  . Stroke [facility])     Past Surgical History:  Procedure Laterality Date  . A/V FISTULAGRAM Left 01/09/2017   Procedure: A/V Fistulagram;  Surgeon: [name], MD;  Location: [place] [facility];  Service: Cardiovascular;  Laterality: Left;  . A/V FISTULAGRAM Left 06/06/2017   Procedure: A/V Fistulagram;  Surgeon: [name], MD;  Location: [place] [facility];  Service: Cardiovascular;  Laterality: Left;  . A/V FISTULAGRAM Left 08/15/2017   Procedure: A/V Fistulagram;  Surgeon: [name], MD;  Location: [place] [facility];  Service: Cardiovascular;  Laterality: Left;  . A/V FISTULAGRAM Left 01/22/2018   Procedure: A/V FISTULAGRAM;  Surgeon: [name], MD;  Location: [place] [facility];  Service: Cardiovascular;  Laterality: Left;  . A/V SHUNT INTERVENTION N/A 06/06/2017   Procedure: A/V SHUNT INTERVENTION;  Surgeon: [name], MD;  Location: [place] [facility];  Service: Cardiovascular;  Laterality: N/A;  . AMPUTATION Right 04/25/2016   Procedure: TOE AMPUTATION;  Surgeon: Katha Cabal, MD;  Location: [facility];  Service: Vascular;  Laterality: Right;  . AMPUTATION Right 09/09/2018   Procedure: AMPUTATION ABOVE KNEE;  Surgeon: [name], MD;  Location: [facility];  Service: Vascular;  Laterality: Right;  . APPLICATION OF WOUND VAC Right 04/25/2016   Procedure: APPLICATION OF WOUND VAC;  Surgeon: Katha Cabal, MD;  Location: [facility];  Service: Vascular;  Laterality: Right;  . AV FISTULA PLACEMENT Left 2015  . CARDIAC CATHETERIZATION N/A 04/30/2016   Procedure:  Left Heart Cath and Coronary Angiography;  Surgeon: Wellington Hampshire, MD;  Location: [place] [facility];  Service: Cardiovascular;  Laterality: N/A;  . CATARACT EXTRACTION, BILATERAL    . LEG AMPUTATION BELOW KNEE Left   . LOWER EXTREMITY ANGIOGRAPHY Right 07/24/2018   Procedure: LOWER EXTREMITY ANGIOGRAPHY;  Surgeon: [name], MD;  Location: [place] [facility];  Service: Cardiovascular;  Laterality: Right;  . LOWER EXTREMITY ANGIOGRAPHY Right 08/13/2018   Procedure: LOWER EXTREMITY ANGIOGRAPHY;  Surgeon: [name], MD;  Location: [place] [facility];  Service: Cardiovascular;  Laterality:  Right;  . LOWER EXTREMITY ANGIOGRAPHY Right 09/04/2018   Procedure: Lower Extremity Angiography;  Surgeon: [name], MD;  Location: [place] [facility];  Service: Cardiovascular;  Laterality: Right;  . PERIPHERAL VASCULAR CATHETERIZATION N/A 10/03/2015   Procedure: A/V Shuntogram/Fistulagram;  Surgeon: [name], MD;  Location: [place] [facility];  Service: Cardiovascular;  Laterality: N/A;  . PERIPHERAL VASCULAR CATHETERIZATION N/A 01/16/2016   Procedure: Abdominal Aortogram w/Lower Extremity;  Surgeon: [name], MD;  Location: [place];  Service: Cardiovascular;  Laterality: N/A;  . PERIPHERAL VASCULAR CATHETERIZATION  01/16/2016   Procedure: Lower Extremity Intervention;  Surgeon: [name], MD;  Location: [facility];  Service: Cardiovascular;;  . PERIPHERAL VASCULAR CATHETERIZATION Right 04/27/2016   Procedure: Lower Extremity Angiography;  Surgeon: Katha Cabal, MD;  Location: [place] [facility];  Service: Cardiovascular;  Laterality: Right;  . PERIPHERAL VASCULAR CATHETERIZATION  04/27/2016   Procedure: Lower Extremity Intervention;  Surgeon: Katha Cabal, MD;  Location: [place] [facility];  Service: Cardiovascular;;  . TRANSMETATARSAL AMPUTATION Right 04/28/2016   Procedure: TRANSMETATARSAL AMPUTATION;  Surgeon: Albertine Patricia, DPM;  Location: [facility];  Service: Podiatry;  Laterality: Right;  . WOUND DEBRIDEMENT Right 04/25/2016   Procedure: DEBRIDEMENT WOUND;  Surgeon: Katha Cabal, MD;  Location: [facility];  Service: Vascular;  Laterality: Right;      No Known Allergies  Current Outpatient Medications  Medication Sig Dispense Refill  . acetaminophen (TYLENOL) 325 MG tablet Take 650 mg by mouth every 6 (six) hours as needed.    Marland Kitchen albuterol (ACCUNEB) 0.63 MG/3ML nebulizer solution Take 3 mLs (0.63 mg total) by nebulization every 4 (four) hours as needed for wheezing. 200 mL 3  . Amino Acids-Protein Hydrolys (FEEDING SUPPLEMENT,  PRO-STAT SUGAR FREE 64,) LIQD Take 30 mLs by mouth daily.    Marland Kitchen amiodarone (PACERONE) 200 MG tablet Take 1 tablet (200 mg total) by mouth 2 (two) times daily. 180 tablet 2  . aspirin EC 81 MG tablet Take 81 mg by mouth daily.    Marland Kitchen b complex-vitamin c-folic acid (NEPHRO-VITE) 0.8 MG TABS tablet Take 1 tablet by mouth daily.    . cinacalcet (SENSIPAR) 60 MG tablet Take 60 mg by mouth daily.     . ferrous sulfate 325 (65 FE) MG EC tablet Take 325 mg by mouth 3 (three) times daily with meals.    Marland Kitchen glipiZIDE (GLUCOTROL) 10 MG tablet Take 10 mg by mouth daily.     Marland Kitchen lactulose (CEPHULAC) 20 g packet Take 1 packet (20 g total) by mouth 3 (three) times daily. 30 each 0  . lidocaine-prilocaine (EMLA) cream Apply 1 application topically as needed (port access).    . metroNIDAZOLE (FLAGYL) 500 MG tablet Take 1 tablet (500 mg total) by mouth 3 (three) times daily. 12 tablet 0  . midodrine (PROAMATINE) 10 MG tablet Take 1 tablet (  10 mg total) by mouth 3 (three) times daily with meals. 30 tablet 0  . Nutritional Supplements (FEEDING SUPPLEMENT, NEPRO CARB STEADY,) LIQD Take 237 mLs by mouth 2 (two) times daily between meals.  0  . omeprazole (PRILOSEC) 40 MG capsule Take 40 mg by mouth 2 (two) times daily.     Marland Kitchen oxyCODONE (OXY IR/ROXICODONE) 5 MG immediate release tablet Take 1 tablet (5 mg total) by mouth every 6 (six) hours as needed for moderate pain or severe pain. 10 tablet 0  . polyethylene glycol (MIRALAX / GLYCOLAX) packet Take 17 g by mouth 2 (two) times daily.     Marland Kitchen senna-docusate (SENOKOT-S) 8.6-50 MG tablet Take 1 tablet by mouth 2 (two) times daily.    . sevelamer carbonate (RENVELA) 800 MG tablet Take 2,400 mg by mouth 3 (three) times daily with meals.     . silver sulfADIAZINE (SILVADENE) 1 % cream Apply 1 application topically daily. 50 g 0   No current facility-administered medications for this visit.         Physical Exam BP (!) 113/59 (BP Location: Left Arm, Patient Position: Sitting)    Pulse 88   Resp 14  Gen:  WD/WN, NAD Skin: Right AKA incision as described above.     Assessment/Plan:  Status post above-knee amputation ([place]) The patient has some mild erythema and continued wound.  Skin to be very important to try to keep this area protected as he traumatizes it regularly.  At this point, we will continue the Silvadene dressing changes daily.  At the request of the wife I have given him a prescription for some Keflex as well.  No need for debridement at this point.  Return to clinic in 3 to 4 weeks.      Leotis Pain 10/28/2018, 12:00 PM   This note was created with Dragon medical transcription system.  Any errors from dictation are unintentional.

## 2018-10-28 NOTE — Telephone Encounter (Signed)
Left message for [name] to call back because it was not a confidential voicemail.

## 2018-10-28 NOTE — Assessment & Plan Note (Signed)
The patient has some mild erythema and continued wound.  Skin to be very important to try to keep this area protected as he traumatizes it regularly.  At this point, we will continue the Silvadene dressing changes daily.  At the request of the wife I have given him a prescription for some Keflex as well.  No need for debridement at this point.  Return to clinic in 3 to 4 weeks.

## 2018-10-28 NOTE — Telephone Encounter (Signed)
[name] from [place] called and left a message on the triage line and stated that she would like to know her next steps. If any changes to patients care is made and if she needs to do anything different or continue things the same.   Call back is [phone]

## 2018-10-29 NOTE — Telephone Encounter (Signed)
[name] is aware of Dr. Ozella Almond message below.

## 2018-11-04 ENCOUNTER — Ambulatory Visit: Payer: Medicare Other | Admitting: Physician Assistant

## 2018-11-11 ENCOUNTER — Telehealth (INDEPENDENT_AMBULATORY_CARE_PROVIDER_SITE_OTHER): Payer: Self-pay

## 2018-11-11 NOTE — Telephone Encounter (Signed)
[name] from [place] called and left a message on the triage line and stated that she needed to speak to someone about the changes in the patients stump. She stated that he is still on antibiotic but she is concerned. I called her back at number left, [phone], and had to leave a voicemail.

## 2018-11-12 ENCOUNTER — Other Ambulatory Visit (INDEPENDENT_AMBULATORY_CARE_PROVIDER_SITE_OTHER): Payer: Self-pay | Admitting: Nurse Practitioner

## 2018-11-12 DIAGNOSIS — Z89611 Acquired absence of right leg above knee: Secondary | ICD-10-CM

## 2018-11-12 MED ORDER — TRAMADOL HCL 50 MG PO TABS: 50.0000 mg | ORAL_TABLET | Freq: Four times a day (QID) | ORAL | 0 refills | Status: AC | PRN

## 2018-11-12 NOTE — Telephone Encounter (Signed)
  [facility] [address]), [place] [address] [place] ([place]) [place] [address] Phone: [phone] Fax: [phone]

## 2018-11-12 NOTE — Telephone Encounter (Signed)
We typically have to oversee the wound for at least 90 days because we created it.  It can take some time for amputation wound healing to occur.  I will call him in a one time prescription for tramadol as well, please verify the correct pharmacy before I send it in. Thanks!

## 2018-11-12 NOTE — Telephone Encounter (Signed)
Caryl Pina from [facility] returned called and left vm on nurse line. Per her she wanted to know if pt could be evaluated at the [facility] wound center due to the wound just not healing well. Also she wanted to know if something could be prescribed for pain since nothing otc works for pt and he is unable to take nsaids due to dialysis. Per pt's wife he is not able to sleep due to sharp shooting pain.    She left a call back number [phone]

## 2018-11-18 ENCOUNTER — Ambulatory Visit (INDEPENDENT_AMBULATORY_CARE_PROVIDER_SITE_OTHER): Payer: Medicare Other | Admitting: Nurse Practitioner

## 2018-11-18 ENCOUNTER — Encounter (INDEPENDENT_AMBULATORY_CARE_PROVIDER_SITE_OTHER): Payer: Self-pay | Admitting: Nurse Practitioner

## 2018-11-18 VITALS — BP 135/83 | HR 80 | Resp 16

## 2018-11-18 DIAGNOSIS — Z89611 Acquired absence of right leg above knee: Secondary | ICD-10-CM

## 2018-11-18 DIAGNOSIS — Z992 Dependence on renal dialysis: Secondary | ICD-10-CM

## 2018-11-18 DIAGNOSIS — E1152 Type 2 diabetes mellitus with diabetic peripheral angiopathy with gangrene: Secondary | ICD-10-CM

## 2018-11-18 DIAGNOSIS — N186 End stage renal disease: Secondary | ICD-10-CM

## 2018-11-18 DIAGNOSIS — T879 Unspecified complications of amputation stump: Secondary | ICD-10-CM

## 2018-11-18 MED ORDER — GABAPENTIN 100 MG PO CAPS: 100.0000 mg | ORAL_CAPSULE | Freq: Three times a day (TID) | ORAL | 0 refills | Status: DC

## 2018-11-18 MED ORDER — SILVER SULFADIAZINE 1 % EX CREA: 1.0000 "application " | TOPICAL_CREAM | Freq: Every day | CUTANEOUS | 0 refills | Status: DC

## 2018-11-18 MED ORDER — CEPHALEXIN 500 MG PO CAPS: 500.0000 mg | ORAL_CAPSULE | Freq: Two times a day (BID) | ORAL | 0 refills | Status: AC

## 2018-11-21 ENCOUNTER — Other Ambulatory Visit
Admission: RE | Admit: 2018-11-21 | Discharge: 2018-11-21 | Disposition: A | Discharge status: Home / Self Care | Payer: Medicare Other | Source: Ambulatory Visit | Attending: Family Medicine | Admitting: Family Medicine

## 2018-11-21 ENCOUNTER — Encounter (INDEPENDENT_AMBULATORY_CARE_PROVIDER_SITE_OTHER): Payer: Self-pay | Admitting: Nurse Practitioner

## 2018-11-21 DIAGNOSIS — E875 Hyperkalemia: Secondary | ICD-10-CM | POA: Insufficient documentation

## 2018-11-21 LAB — POTASSIUM: Potassium: 3.1 mmol/L — ABNORMAL LOW (ref 3.5–5.1)

## 2018-11-21 NOTE — Progress Notes (Addendum)
Subjective:    Patient ID: [name], male    DOB: 06/26/1961, 58 y.o.   MRN: [id number] Chief Complaint  Patient presents with  . Follow-up    HPI  [name] is a 58 y.o. male presents today for wound evaluation following right above-knee amputation.  Wound has had a long process healing.  The medial portion is open with fibrinous exudate.  The lateral portions are closed with scabbing.  The patient states that he feels as if there suture still left in his wound, however I instructed the patient that there are no sutures used with a closure of his wound only staples and there are none currently present.  The patient's wife is also present and she is concerned about his pain.  He states that he does not rest well.  She states that he is restless, but does not want pain medicine due to the fact that it makes him feel bad.  He states that gabapentin works well however they are concerned because previously he had tremors.  Patient currently does not display any signs of infection however his wife is afraid that he will develop some sort of infection.  He denies any fever, chills, nausea vomiting or diarrhea.  He denies any issues with his dialysis access currently.  He denies any chest pain or shortness of breath  The patient currently utilizes a diabetic diet.   Past Medical History:  Diagnosis Date  . (HFpEF) heart failure with preserved ejection fraction ([place])    a. 2017 EF per LHC 30-35%; b. 09/2018 Echo: EF 55-60%, no rwma, Gr2 DD, mild MR, mildly dil LA. PASP 66mmHg. small to mod circumferential pericardial eff, no tamponade.  . Anemia of chronic disease   . Atherosclerosis   . CAD (coronary artery disease)    a. 2017 LHC, severe 1v disease, occluded pRCA, L-R collaterals, moderate LV dysfunction EF 30-35.  . Diabetes mellitus with complication ([place])   . End stage renal disease on dialysis ([place])    Labile BP  . History of ventricular tachycardia    a. 2017 polymorphic VT and  cardiac arrest.  . Hypercholesteremia   . Hyperlipidemia LDL goal <70   . Hypertension   . Labile blood pressure    on HD  . NSVT (nonsustained ventricular tachycardia) ([place])    08/2018 NSVT  . PAF (paroxysmal atrial fibrillation) ([place])    a. 08/2018 CHA2DS2VASc at least 6-->initially placed on eliquis but d/c'd 2/2 anemia. On Amio.  Marland Kitchen Peripheral arterial occlusive disease (HCC)    s/p L BKA, R AKA  . S/P AKA (above knee amputation) unilateral, right ([place])    08/2018  . S/P BKA (below knee amputation) unilateral, left ([place])    2015  . Stroke [facility])     Past Surgical History:  Procedure Laterality Date  . A/V FISTULAGRAM Left 01/09/2017   Procedure: A/V Fistulagram;  Surgeon: [name], MD;  Location: [place] [facility];  Service: Cardiovascular;  Laterality: Left;  . A/V FISTULAGRAM Left 06/06/2017   Procedure: A/V Fistulagram;  Surgeon: [name], MD;  Location: [place] [facility];  Service: Cardiovascular;  Laterality: Left;  . A/V FISTULAGRAM Left 08/15/2017   Procedure: A/V Fistulagram;  Surgeon: [name], MD;  Location: [place] [facility];  Service: Cardiovascular;  Laterality: Left;  . A/V FISTULAGRAM Left 01/22/2018   Procedure: A/V FISTULAGRAM;  Surgeon: [name], MD;  Location: [place] [facility];  Service: Cardiovascular;  Laterality: Left;  . A/V SHUNT INTERVENTION N/A 06/06/2017   Procedure: A/V SHUNT INTERVENTION;  Surgeon: [name], MD;  Location: [place] [facility];  Service: Cardiovascular;  Laterality: N/A;  . AMPUTATION Right 04/25/2016   Procedure: TOE AMPUTATION;  Surgeon: Katha Cabal, MD;  Location: [facility];  Service: Vascular;  Laterality: Right;  . AMPUTATION Right 09/09/2018   Procedure: AMPUTATION ABOVE KNEE;  Surgeon: [name], MD;  Location: [facility];  Service: Vascular;  Laterality: Right;  . APPLICATION OF WOUND VAC Right 04/25/2016   Procedure: APPLICATION OF WOUND VAC;  Surgeon: Katha Cabal, MD;  Location: [facility]  [facility];  Service: Vascular;  Laterality: Right;  . AV FISTULA PLACEMENT Left 2015  . CARDIAC CATHETERIZATION N/A 04/30/2016   Procedure: Left Heart Cath and Coronary Angiography;  Surgeon: Wellington Hampshire, MD;  Location: [place] [facility];  Service: Cardiovascular;  Laterality: N/A;  . CATARACT EXTRACTION, BILATERAL    . LEG AMPUTATION BELOW KNEE Left   . LOWER EXTREMITY ANGIOGRAPHY Right 07/24/2018   Procedure: LOWER EXTREMITY ANGIOGRAPHY;  Surgeon: [name], MD;  Location: [place] [facility];  Service: Cardiovascular;  Laterality: Right;  . LOWER EXTREMITY ANGIOGRAPHY Right 08/13/2018   Procedure: LOWER EXTREMITY ANGIOGRAPHY;  Surgeon: [name], MD;  Location: [name] [facility];  Service: Cardiovascular;  Laterality: Right;  . LOWER EXTREMITY ANGIOGRAPHY Right 09/04/2018   Procedure: Lower Extremity Angiography;  Surgeon: [name], MD;  Location: [place] [facility];  Service: Cardiovascular;  Laterality: Right;  . PERIPHERAL VASCULAR CATHETERIZATION N/A 10/03/2015   Procedure: A/V Shuntogram/Fistulagram;  Surgeon: [name], MD;  Location: [place] [facility];  Service: Cardiovascular;  Laterality: N/A;  . PERIPHERAL VASCULAR CATHETERIZATION N/A 01/16/2016   Procedure: Abdominal Aortogram w/Lower Extremity;  Surgeon: [name], MD;  Location: [facility] [facility];  Service: Cardiovascular;  Laterality: N/A;  . PERIPHERAL VASCULAR CATHETERIZATION  01/16/2016   Procedure: Lower Extremity Intervention;  Surgeon: [name], MD;  Location: [facility];  Service: Cardiovascular;;  . PERIPHERAL VASCULAR CATHETERIZATION Right 04/27/2016   Procedure: Lower Extremity Angiography;  Surgeon: Katha Cabal, MD;  Location: [place] [facility];  Service: Cardiovascular;  Laterality: Right;  . PERIPHERAL VASCULAR CATHETERIZATION  04/27/2016   Procedure: Lower Extremity Intervention;  Surgeon: Katha Cabal, MD;  Location: [place] [facility];  Service: Cardiovascular;;    . TRANSMETATARSAL AMPUTATION Right 04/28/2016   Procedure: TRANSMETATARSAL AMPUTATION;  Surgeon: Albertine Patricia, DPM;  Location: [facility];  Service: Podiatry;  Laterality: Right;  . WOUND DEBRIDEMENT Right 04/25/2016   Procedure: DEBRIDEMENT WOUND;  Surgeon: Katha Cabal, MD;  Location: [facility];  Service: Vascular;  Laterality: Right;    Social History   Socioeconomic History  . Marital status: Married    Spouse name: Not on file  . Number of children: Not on file  . Years of education: Not on file  . Highest education level: Not on file  Occupational History  . Not on file  Social Needs  . Financial resource strain: Not on file  . Food insecurity:    Worry: Patient refused    Inability: Patient refused  . Transportation needs:    Medical: No    Non-medical: No  Tobacco Use  . Smoking status: Never Smoker  . Smokeless tobacco: Never Used  Substance and Sexual Activity  . Alcohol use: No  . Drug use: No  . Sexual activity: Not  on file  Lifestyle  . Physical activity:    Days per week: Not on file    Minutes per session: Not on file  . Stress: Not at all  Relationships  . Social connections:    Talks on phone: More than three times a week    Gets together: Three times a week    Attends religious service: Not on file    Active member of club or organization: Not on file    Attends meetings of clubs or organizations: Not on file    Relationship status: Not on file  . Intimate partner violence:    Fear of current or ex partner: No    Emotionally abused: Not on file    Physically abused: Not on file    Forced sexual activity: Not on file  Other Topics Concern  . Not on file  Social History Narrative  . Not on file    Family History  Problem Relation Age of Onset  . Diabetes Father   . Prostate cancer Father     No Known Allergies   Review of Systems   Review of Systems: Negative Unless Checked Constitutional: [] Weight loss  [] Fever   [] Chills Cardiac: [] Chest pain   [x]  Atrial Fibrillation  [] Palpitations   [] Shortness of breath when laying flat   [x] Shortness of breath with exertion. [] Shortness of breath at rest Vascular:  [] Pain in legs with walking   [] Pain in legs with standing [] Pain in legs when laying flat   [] Claudication    [] Pain in feet when laying flat    [] History of DVT   [] Phlebitis   [] Swelling in legs   [] Varicose veins   [] Non-healing ulcers Pulmonary:   [] Uses home oxygen   [] Productive cough   [] Hemoptysis   [] Wheeze  [] COPD   [] Asthma Neurologic:  [] Dizziness   [] Seizures  [] Blackouts [] History of stroke   [] History of TIA  [] Aphasia   [] Temporary Blindness   [] Weakness or numbness in arm   [x] Weakness or numbness in leg Musculoskeletal:   [] Joint swelling   [] Joint pain   [] Low back pain  []  History of Knee Replacement [] Arthritis [] back Surgeries  []  Spinal Stenosis    Hematologic:  [] Easy bruising  [] Easy bleeding   [] Hypercoagulable state   [x] Anemic Gastrointestinal:  [] Diarrhea   [] Vomiting  [] Gastroesophageal reflux/heartburn   [] Difficulty swallowing. [] Abdominal pain Genitourinary:  [x] Chronic kidney disease   [] Difficult urination  [] Anuric   [] Blood in urine [] Frequent urination  [] Burning with urination   [] Hematuria Skin:  [] Rashes   [] Ulcers [] Wounds Psychological:  [] History of anxiety   []  History of major depression  []  Memory Difficulties     Objective:   Physical Exam  BP 135/83 (BP Location: Right Arm, Patient Position: Sitting, Cuff Size: Normal)   Pulse 80   Resp 16   Gen: WD/WN, NAD Head: [place]/AT, No temporalis wasting.  Poor dentition Ear/Nose/Throat: Hearing grossly intact, nares w/o erythema or drainage Eyes: PER, EOMI, sclera nonicteric.  Neck: Supple, no masses.  No JVD.  Pulmonary:  Good air movement, no use of accessory muscles.  Cardiac: RRR Vascular:  Closed wound until you reach the medial portion which is open with tissue covered in slough, somewhat wet, slight  drainage Vessel Right Left  Radial Palpable Palpable  Gastrointestinal: soft, non-distended. No guarding/no peritoneal signs.  Musculoskeletal: Wheelchair-bound, right above-knee amputation, left below-knee amputation Neurologic: Pain and light touch intact in extremities.  Symmetrical.  Speech is fluent. Motor exam as listed above.  Psychiatric: Judgment intact, Mood & affect appropriate for pt's clinical situation. Dermatologic: No Venous rashes. No Ulcers Noted.  No changes consistent with cellulitis. Lymph : No Cervical lymphadenopathy, no lichenification or skin changes of chronic lymphedema.      Assessment & Plan:   1. Complication of above knee amputation stump ([place]) The patient has had a difficult time with healing this wound.  I have faxed over orders to his home health agency to use Aquacell AG on his wound in order to try to see if the wound will heal without need for debridement.  We will attempt this for the next 3 weeks and if there is no progression in wound healing, we will proceed with debridement and wound VAC placement of his wound.  If the patient gabapentin and instructed her to start with 1 capsule nightly to determine how it helps him and to increase by 1 capsule every week to determine if it is helping his pain and discomfort. - silver sulfADIAZINE (SILVADENE) 1 % cream; Apply 1 application topically daily.  Dispense: 400 g; Refill: 0 - gabapentin (NEURONTIN) 100 MG capsule; Take 1 capsule (100 mg total) by mouth 3 (three) times daily.  Dispense: 90 capsule; Refill: 0 - cephALEXin (KEFLEX) 500 MG capsule; Take 1 capsule (500 mg total) by mouth 2 (two) times daily for 14 days.  Dispense: 28 capsule; Refill: 0  2. Type 2 diabetes mellitus with diabetic peripheral angiopathy with gangrene, unspecified whether long term insulin use (HCC) Continue hypoglycemic medications as already ordered, these medications have been reviewed and there are no changes at this time.  Hgb  A1C to be monitored as already arranged by primary service   3. ESRD on dialysis [facility]) Currently doing well and no complaints or issues.   Current Outpatient Medications on File Prior to Visit  Medication Sig Dispense Refill  . acetaminophen (TYLENOL) 325 MG tablet Take 650 mg by mouth every 6 (six) hours as needed.    Marland Kitchen albuterol (ACCUNEB) 0.63 MG/3ML nebulizer solution Take 3 mLs (0.63 mg total) by nebulization every 4 (four) hours as needed for wheezing. 200 mL 3  . amiodarone (PACERONE) 200 MG tablet Take 1 tablet (200 mg total) by mouth 2 (two) times daily. 180 tablet 2  . aspirin EC 81 MG tablet Take 81 mg by mouth daily.    Marland Kitchen b complex-vitamin c-folic acid (NEPHRO-VITE) 0.8 MG TABS tablet Take 1 tablet by mouth daily.    . cinacalcet (SENSIPAR) 60 MG tablet Take 60 mg by mouth daily.     . ferrous sulfate 325 (65 FE) MG EC tablet Take 325 mg by mouth 3 (three) times daily with meals.    Marland Kitchen glipiZIDE (GLUCOTROL) 10 MG tablet Take 10 mg by mouth daily.     Marland Kitchen lactulose (CEPHULAC) 20 g packet Take 1 packet (20 g total) by mouth 3 (three) times daily. 30 each 0  . lidocaine-prilocaine (EMLA) cream Apply 1 application topically as needed (port access).    . midodrine (PROAMATINE) 10 MG tablet Take 1 tablet (10 mg total) by mouth 3 (three) times daily with meals. 30 tablet 0  . Nutritional Supplements (FEEDING SUPPLEMENT, NEPRO CARB STEADY,) LIQD Take 237 mLs by mouth 2 (two) times daily between meals.  0  . omeprazole (PRILOSEC) 40 MG capsule Take 40 mg by mouth 2 (two) times daily.     . polyethylene glycol (MIRALAX / GLYCOLAX) packet Take 17 g by mouth 2 (two) times daily.     Marland Kitchen  sevelamer carbonate (RENVELA) 800 MG tablet Take 2,400 mg by mouth 3 (three) times daily with meals.     Marland Kitchen oxyCODONE (OXY IR/ROXICODONE) 5 MG immediate release tablet Take 1 tablet (5 mg total) by mouth every 6 (six) hours as needed for moderate pain or severe pain. (Patient not taking: Reported on 11/18/2018) 10  tablet 0   No current facility-administered medications on file prior to visit.     There are no Patient Instructions on file for this visit. No follow-ups on file.   Kris Hartmann, NP  This note was completed with Sales executive.  Any errors are purely unintentional.

## 2018-11-24 NOTE — Progress Notes (Signed)
Cardiology Office Note Date:  11/25/2018  Patient ID:  [name], [name] [birth year], MRN [id number] PCP:  [name], MD  Cardiologist:  Dr. Fletcher Anon, MD    Chief Complaint: Follow up  History of Present Illness: [name] is a 58 y.o. male with history of CAD s/p polymorphic VT arrest and MI in 2017 as detailed below, HFrEF secondary to ICM with subsequent normalization of his EF, PAF diagnosed in 08/2018 no longer on [place] secondary to anemia, NSVT, PAD s/p left BKA and right AKA, prior stroke, ESRD on HD, DM2, relative hypotension/labile BP, HLD, and anemia of chronic disease who presents for follow-up of his CAD, ischemic cardiomyopathy, and A. fib.  Patient was admitted in 04/2016 with an MI and polymorphic VT arrest. Cardiac cath at that time showed CTO of the RCA with left-to-right collaterals and minimal irregularities involving the left coronary arteries.  He was noted to have moderately reduced LV systolic function with an EF of 30 to 35% with global hypokinesis that was more pronounced in the basal inferior wall.  It was recommended to medically manage. Echo showed an EF of 40 to 45%, mild mitral regurgitation, mildly dilated left atrium, moderate pulmonary regurgitation, PASP 45 mmHg.  Patient was noted to have frequent PVCs during the admission with recommendation for outpatient cardiac monitoring and EP follow-up.  Following that admission, the patient was lost to follow-up.    Patient was recently admitted to [facility] in 08/2018, with acute limb ischemia of the right lower extremity requiring right AKA.  On 09/10/2018 he developed A. fib with RVR and nonsustained VT.  He was placed on IV amiodarone and subsequently converted to oral amiodarone.  Initially, he was placed on Eliquis and subsequently discharged.  He was readmitted on 12/8 with chest pain, fevers, and chills.  He was noted to be back in atrial fibrillation.  He was also anemic with a hemoglobin of 6.8 and required  blood transfusion.  His troponin was mildly elevated at 0.26.  This was felt to be secondary to demand ischemia in the setting of A. fib with RVR and sepsis.  He was not felt to be a candidate for invasive procedures such as diagnostic cardiac catheterization.  Given he required transfusion, the decision was made to discontinue Eliquis.  Echo during that admission showed normal LV systolic function with a small to moderate pericardial effusion without tamponade physiology.  It was felt no further work-up was required.  He was reloaded with amiodarone and subsequently converted to sinus rhythm.  He was last seen in the office on 10/02/2018 and was doing reasonably well.  He did note some mild chest discomfort at the end of his dialysis sessions.  He was noted to be in A. fib with ventricular rates in the 90s bpm at that time.  His amiodarone was increased to 200 mg twice daily.  He was admitted to the hospital in 09/2018 with evidence of volume overload requiring hemodialysis as well as sepsis of uncertain etiology.  He briefly developed A. fib with RVR though spontaneously converted back to sinus rhythm during his admission.  He was continued on amiodarone 200 mg once a day.  He was again felt to not be a candidate for anticoagulation given his recent significant GI bleed on Eliquis.  He has not been managed with beta-blocker secondary to relative hypotension.  Troponin was minimally elevated at 0.05.  He was felt to have a poor prognosis.  He has continued to  have a difficult time with healing of his recent stump wound and is followed by vascular surgery for this.  He comes in accompanied by his wife today.  Office visit is completed with the assistance of hospital supplied medical interpreter.  He has done well since his most recent hospital admission.  He denies any chest pain, shortness of breath, palpitations, dizziness, presyncope, or syncope.  No swelling of his bilateral stumps.  No orthopnea, PND, or  early satiety.  His appetite and energy continues to improve.  He continues to note drops in blood pressure at the completion of his dialysis sessions.  In this setting, we have been unable to escalate evidence-based heart failure therapy.  He denies any further chest pain during his hemodialysis sessions.  He is compliant with all medications.  He denies any recent falls, BRBPR, or melena.  He does not have any issues or concerns today.   Past Medical History:  Diagnosis Date  . (HFpEF) heart failure with preserved ejection fraction ([place])    a. 2017 EF per LHC 30-35%; b. 09/2018 Echo: EF 55-60%, no rwma, Gr2 DD, mild MR, mildly dil LA. PASP 24mmHg. small to mod circumferential pericardial eff, no tamponade.  . Anemia of chronic disease   . Atherosclerosis   . CAD (coronary artery disease)    a. 2017 LHC, severe 1v disease, occluded pRCA, L-R collaterals, moderate LV dysfunction EF 30-35.  . Diabetes mellitus with complication ([place])   . End stage renal disease on dialysis ([place])    Labile BP  . History of ventricular tachycardia    a. 2017 polymorphic VT and cardiac arrest.  . Hypercholesteremia   . Hyperlipidemia LDL goal <70   . Hypertension   . Labile blood pressure    on HD  . NSVT (nonsustained ventricular tachycardia) ([place])    08/2018 NSVT  . PAF (paroxysmal atrial fibrillation) ([place])    a. 08/2018 CHA2DS2VASc at least 6-->initially placed on eliquis but d/c'd 2/2 anemia. On Amio.  Marland Kitchen Peripheral arterial occlusive disease (HCC)    s/p L BKA, R AKA  . S/P AKA (above knee amputation) unilateral, right ([place])    08/2018  . S/P BKA (below knee amputation) unilateral, left ([place])    2015  . Stroke [facility])     Past Surgical History:  Procedure Laterality Date  . A/V FISTULAGRAM Left 01/09/2017   Procedure: A/V Fistulagram;  Surgeon: [name], MD;  Location: [place] [facility];  Service: Cardiovascular;  Laterality: Left;  . A/V FISTULAGRAM Left 06/06/2017   Procedure: A/V  Fistulagram;  Surgeon: [name], MD;  Location: [place] [facility];  Service: Cardiovascular;  Laterality: Left;  . A/V FISTULAGRAM Left 08/15/2017   Procedure: A/V Fistulagram;  Surgeon: [name], MD;  Location: [place] [facility];  Service: Cardiovascular;  Laterality: Left;  . A/V FISTULAGRAM Left 01/22/2018   Procedure: A/V FISTULAGRAM;  Surgeon: [name], MD;  Location: [place] [facility];  Service: Cardiovascular;  Laterality: Left;  . A/V SHUNT INTERVENTION N/A 06/06/2017   Procedure: A/V SHUNT INTERVENTION;  Surgeon: [name], MD;  Location: [place] [facility];  Service: Cardiovascular;  Laterality: N/A;  . AMPUTATION Right 04/25/2016   Procedure: TOE AMPUTATION;  Surgeon: Katha Cabal, MD;  Location: [facility];  Service: Vascular;  Laterality: Right;  . AMPUTATION Right 09/09/2018   Procedure: AMPUTATION ABOVE KNEE;  Surgeon: [name], MD;  Location: [facility];  Service: Vascular;  Laterality:  Right;  Marland Kitchen APPLICATION OF WOUND VAC Right 04/25/2016   Procedure: APPLICATION OF WOUND VAC;  Surgeon: Katha Cabal, MD;  Location: [facility];  Service: Vascular;  Laterality: Right;  . AV FISTULA PLACEMENT Left 2015  . CARDIAC CATHETERIZATION N/A 04/30/2016   Procedure: Left Heart Cath and Coronary Angiography;  Surgeon: Wellington Hampshire, MD;  Location: [place] [facility];  Service: Cardiovascular;  Laterality: N/A;  . CATARACT EXTRACTION, BILATERAL    . LEG AMPUTATION BELOW KNEE Left   . LOWER EXTREMITY ANGIOGRAPHY Right 07/24/2018   Procedure: LOWER EXTREMITY ANGIOGRAPHY;  Surgeon: [name], MD;  Location: [place] [facility];  Service: Cardiovascular;  Laterality: Right;  . LOWER EXTREMITY ANGIOGRAPHY Right 08/13/2018   Procedure: LOWER EXTREMITY ANGIOGRAPHY;  Surgeon: [name], MD;  Location: [place] [facility];  Service: Cardiovascular;  Laterality: Right;  . LOWER EXTREMITY ANGIOGRAPHY Right 09/04/2018   Procedure: Lower Extremity Angiography;   Surgeon: [name], MD;  Location: [place] [facility];  Service: Cardiovascular;  Laterality: Right;  . PERIPHERAL VASCULAR CATHETERIZATION N/A 10/03/2015   Procedure: A/V Shuntogram/Fistulagram;  Surgeon: [name], MD;  Location: [place] [facility];  Service: Cardiovascular;  Laterality: N/A;  . PERIPHERAL VASCULAR CATHETERIZATION N/A 01/16/2016   Procedure: Abdominal Aortogram w/Lower Extremity;  Surgeon: [name], MD;  Location: [place] [facility];  Service: Cardiovascular;  Laterality: N/A;  . PERIPHERAL VASCULAR CATHETERIZATION  01/16/2016   Procedure: Lower Extremity Intervention;  Surgeon: [name], MD;  Location: [place] [facility];  Service: Cardiovascular;;  . PERIPHERAL VASCULAR CATHETERIZATION Right 04/27/2016   Procedure: Lower Extremity Angiography;  Surgeon: Katha Cabal, MD;  Location: [place] [facility];  Service: Cardiovascular;  Laterality: Right;  . PERIPHERAL VASCULAR CATHETERIZATION  04/27/2016   Procedure: Lower Extremity Intervention;  Surgeon: Katha Cabal, MD;  Location: [place] [facility];  Service: Cardiovascular;;  . TRANSMETATARSAL AMPUTATION Right 04/28/2016   Procedure: TRANSMETATARSAL AMPUTATION;  Surgeon: Albertine Patricia, DPM;  Location: [facility];  Service: Podiatry;  Laterality: Right;  . WOUND DEBRIDEMENT Right 04/25/2016   Procedure: DEBRIDEMENT WOUND;  Surgeon: Katha Cabal, MD;  Location: [facility];  Service: Vascular;  Laterality: Right;    Current Meds  Medication Sig  . acetaminophen (TYLENOL) 325 MG tablet Take 650 mg by mouth every 6 (six) hours as needed.  Marland Kitchen albuterol (ACCUNEB) 0.63 MG/3ML nebulizer solution Take 3 mLs (0.63 mg total) by nebulization every 4 (four) hours as needed for wheezing.  Marland Kitchen amiodarone (PACERONE) 200 MG tablet Take 1 tablet (200 mg total) by mouth 2 (two) times daily.  Marland Kitchen aspirin EC 81 MG tablet Take 81 mg by mouth daily.  Marland Kitchen b complex-vitamin c-folic acid (NEPHRO-VITE) 0.8 MG TABS tablet Take 1  tablet by mouth daily.  . cephALEXin (KEFLEX) 500 MG capsule Take 1 capsule (500 mg total) by mouth 2 (two) times daily for 14 days.  . cinacalcet (SENSIPAR) 60 MG tablet Take 60 mg by mouth daily.   . ferrous sulfate 325 (65 FE) MG EC tablet Take 325 mg by mouth 3 (three) times daily with meals.  . gabapentin (NEURONTIN) 100 MG capsule Take 1 capsule (100 mg total) by mouth 3 (three) times daily.  Marland Kitchen glipiZIDE (GLUCOTROL) 10 MG tablet Take 10 mg by mouth daily.   Marland Kitchen lactulose (CEPHULAC) 20 g packet Take 1 packet (20 g total) by mouth 3 (three) times daily.  Marland Kitchen lidocaine-prilocaine (EMLA) cream Apply 1 application  topically as needed (port access).  . midodrine (PROAMATINE) 10 MG tablet Take 1 tablet (10 mg total) by mouth 3 (three) times daily with meals.  . Nutritional Supplements (FEEDING SUPPLEMENT, NEPRO CARB STEADY,) LIQD Take 237 mLs by mouth 2 (two) times daily between meals.  Marland Kitchen omeprazole (PRILOSEC) 40 MG capsule Take 40 mg by mouth 2 (two) times daily.   . ondansetron (ZOFRAN) 8 MG tablet Take 8 mg by mouth every 8 (eight) hours as needed.   Marland Kitchen oxyCODONE (OXY IR/ROXICODONE) 5 MG immediate release tablet Take 1 tablet (5 mg total) by mouth every 6 (six) hours as needed for moderate pain or severe pain.  . polyethylene glycol (MIRALAX / GLYCOLAX) packet Take 17 g by mouth 2 (two) times daily.   . sevelamer carbonate (RENVELA) 800 MG tablet Take 2,400 mg by mouth 3 (three) times daily with meals.   . silver sulfADIAZINE (SILVADENE) 1 % cream Apply 1 application topically daily.    Allergies:   Patient has no known allergies.   Social History:  The patient  reports that he has never smoked. He has never used smokeless tobacco. He reports that he does not drink alcohol or use drugs.   Family History:  The patient's family history includes Diabetes in his father; Prostate cancer in his father.  ROS:   Review of Systems  Constitutional: Positive for malaise/fatigue. Negative for chills,  diaphoresis, fever and weight loss.  HENT: Negative for congestion.   Eyes: Negative for discharge and redness.  Respiratory: Negative for cough, hemoptysis, sputum production, shortness of breath and wheezing.   Cardiovascular: Negative for chest pain, palpitations, orthopnea, claudication, leg swelling and PND.  Gastrointestinal: Negative for abdominal pain, blood in stool, heartburn, melena, nausea and vomiting.  Genitourinary: Negative for hematuria.  Musculoskeletal: Negative for falls and myalgias.  Skin: Negative for rash.  Neurological: Positive for weakness. Negative for dizziness, tingling, tremors, sensory change, speech change, focal weakness and loss of consciousness.  Endo/Heme/Allergies: Does not bruise/bleed easily.  Psychiatric/Behavioral: Negative for substance abuse. The patient is not nervous/anxious.   All other systems reviewed and are negative.    PHYSICAL EXAM:  VS:  BP 130/83 (BP Location: Right Arm, Patient Position: Sitting, Cuff Size: Normal)   Pulse 79   Ht 5\' 7"  (1.702 m)   Wt 222 lb 0.1 oz (100.7 kg) Comment: checked at dialysis on 11/24/2018  BMI 34.77 kg/m  BMI: Body mass index is 34.77 kg/m.  Physical Exam  Constitutional: He is oriented to person, place, and time. He appears well-developed and well-nourished.  HENT:  Head: Normocephalic and atraumatic.  Eyes: Right eye exhibits no discharge. Left eye exhibits no discharge.  Neck: Normal range of motion. No JVD present.  Cardiovascular: Normal rate, regular rhythm, S1 normal, S2 normal and normal heart sounds. Exam reveals no distant heart sounds, no friction rub, no midsystolic click and no opening snap.  No murmur heard. Pulmonary/Chest: Effort normal and breath sounds normal. No respiratory distress. He has no decreased breath sounds. He has no wheezes. He has no rales. He exhibits no tenderness.  Abdominal: Soft. He exhibits no distension. There is no abdominal tenderness.  Musculoskeletal:         General: No edema.     Comments: No edema of his bilateral lower extremity stump sites.  He is status post bilateral lower extremity amputations.  Neurological: He is alert and oriented to person, place, and time.  Skin: Skin is warm and dry. No cyanosis. Nails show  no clubbing.  Psychiatric: He has a normal mood and affect. His speech is normal and behavior is normal. Judgment and thought content normal.     EKG:  Was ordered and interpreted by me today. Shows NSR, 79 bpm, 1st degree AV block, right axis deviation, poor R wave progression along the precordial leads, TWI lateral leads    Recent Labs: 09/10/2018: ALT 10; Magnesium 2.6; TSH 1.512 10/09/2018: B Natriuretic Peptide 3,708.0 10/10/2018: BUN 50; Creatinine, Ser 5.57; Hemoglobin 8.9; Platelets 387; Sodium 139 11/21/2018: Potassium 3.1  09/10/2018: Cholesterol 137; HDL 20; LDL Cholesterol 68; Total CHOL/HDL Ratio 6.9; Triglycerides 244; VLDL 49   CrCl cannot be calculated (Patient's most recent lab result is older than the maximum 21 days allowed.).   Wt Readings from Last 3 Encounters:  11/25/18 222 lb 0.1 oz (100.7 kg)  10/10/18 228 lb 9.9 oz (103.7 kg)  10/02/18 217 lb 5 oz (98.6 kg)     Other studies reviewed: Additional studies/records reviewed today include: summarized above  ASSESSMENT AND PLAN:  1. CAD involving the native coronary arteries without angina: No symptoms suggestive of angina.  Continue aspirin 81 mg daily.  He has been felt to be a poor candidate for further invasive cardiac diagnostic procedures.  Therefore, we will continue to recommend medical therapy.  2. PAF: He is maintaining sinus rhythm.  Decrease amiodarone to 200 mg once daily.  Not on beta-blocker or calcium channel blocker given his underlying relative hypotension and history of cardiomyopathy as outlined below.  No longer on oral anticoagulation secondary to recent anemia requiring packed red blood cell transfusion.  He is aware of stroke  risk.  Recent liver function and thyroid function unrevealing.  3. Ischemic cardiomyopathy: He has had subsequent normalization of his EF.  He is not on any evidence-based heart failure therapies secondary to relative hypotension during hemodialysis sessions as well as end-stage renal disease.  Volume is managed by hemodialysis.  CHF education.  4. End-stage renal disease: Reports compliance with hemodialysis.  Per nephrology.  5. Anemia of chronic disease: Stable on most recent check.  Denies any active bleeding.  He receives regular blood work through his hemodialysis sessions.  6. PAD: Followed by vascular surgery.  Remains on aspirin.  7. Relative hypotension: This has precluded escalation of evidence-based heart failure therapy as outlined above.  Blood pressure today is 130s over 80s however he continues to note significant drops in his blood pressure and hemodialysis which precludes escalation of medications at this time.  8. Language barrier: Hospital supplied medical interpreter used for the entire visit.  Disposition: F/u with Dr. Fletcher Anon or an APP in 3 months.  Current medicines are reviewed at length with the patient today.  The patient did not have any concerns regarding medicines.  Signed, Christell Faith, PA-C 11/25/2018 2:32 PM     [place] [address] [place] [address] [place], [place] [address] [phone]

## 2018-11-25 ENCOUNTER — Encounter: Payer: Self-pay | Admitting: Physician Assistant

## 2018-11-25 ENCOUNTER — Ambulatory Visit (INDEPENDENT_AMBULATORY_CARE_PROVIDER_SITE_OTHER): Payer: Medicare Other | Admitting: Physician Assistant

## 2018-11-25 VITALS — BP 130/83 | HR 79 | Ht 67.0 in | Wt 222.0 lb

## 2018-11-25 DIAGNOSIS — I2510 Atherosclerotic heart disease of native coronary artery without angina pectoris: Secondary | ICD-10-CM

## 2018-11-25 DIAGNOSIS — I255 Ischemic cardiomyopathy: Secondary | ICD-10-CM | POA: Diagnosis not present

## 2018-11-25 DIAGNOSIS — I5022 Chronic systolic (congestive) heart failure: Secondary | ICD-10-CM | POA: Diagnosis not present

## 2018-11-25 DIAGNOSIS — I480 Paroxysmal atrial fibrillation: Secondary | ICD-10-CM | POA: Diagnosis not present

## 2018-11-25 DIAGNOSIS — N186 End stage renal disease: Secondary | ICD-10-CM

## 2018-11-25 DIAGNOSIS — D638 Anemia in other chronic diseases classified elsewhere: Secondary | ICD-10-CM

## 2018-11-25 DIAGNOSIS — I739 Peripheral vascular disease, unspecified: Secondary | ICD-10-CM

## 2018-11-25 MED ORDER — AMIODARONE HCL 200 MG PO TABS: 200.0000 mg | ORAL_TABLET | Freq: Every day | ORAL | 3 refills | Status: AC

## 2018-11-25 NOTE — Patient Instructions (Signed)
Medication Instructions:  Your physician has recommended you make the following change in your medication: 1- DECREASE Amiodarone Take 1 tablet (200 mg total) by mouth daily. If you need a refill on your cardiac medications before your next appointment, please call your pharmacy.   Lab work: None ordered  If you have labs (blood work) drawn today and your tests are completely normal, you will receive your results only by: Marland Kitchen MyChart Message (if you have MyChart) OR . A paper copy in the mail If you have any lab test that is abnormal or we need to change your treatment, we will call you to review the results.  Testing/Procedures: None ordered   Follow-Up: At [facility], you and your health needs are our priority.  As part of our continuing mission to provide you with exceptional heart care, we have created designated Provider Care Teams.  These Care Teams include your primary Cardiologist (physician) and Advanced Practice Providers (APPs -  Physician Assistants and Nurse Practitioners) who all work together to provide you with the care you need, when you need it. You will need a follow up appointment in 3 months.  You may see Kathlyn Sacramento, MD or Christell Faith, PA-C

## 2018-12-09 ENCOUNTER — Other Ambulatory Visit (INDEPENDENT_AMBULATORY_CARE_PROVIDER_SITE_OTHER): Payer: Self-pay | Admitting: Nurse Practitioner

## 2018-12-09 ENCOUNTER — Encounter (INDEPENDENT_AMBULATORY_CARE_PROVIDER_SITE_OTHER): Payer: Self-pay

## 2018-12-09 ENCOUNTER — Encounter (INDEPENDENT_AMBULATORY_CARE_PROVIDER_SITE_OTHER): Payer: Self-pay | Admitting: Vascular Surgery

## 2018-12-09 ENCOUNTER — Ambulatory Visit (INDEPENDENT_AMBULATORY_CARE_PROVIDER_SITE_OTHER): Payer: Medicare Other | Admitting: Vascular Surgery

## 2018-12-09 VITALS — BP 137/81 | HR 16 | Resp 16 | Wt 224.0 lb

## 2018-12-09 DIAGNOSIS — N186 End stage renal disease: Secondary | ICD-10-CM | POA: Diagnosis not present

## 2018-12-09 DIAGNOSIS — E1152 Type 2 diabetes mellitus with diabetic peripheral angiopathy with gangrene: Secondary | ICD-10-CM | POA: Diagnosis not present

## 2018-12-09 DIAGNOSIS — I255 Ischemic cardiomyopathy: Secondary | ICD-10-CM | POA: Diagnosis not present

## 2018-12-09 DIAGNOSIS — Z992 Dependence on renal dialysis: Secondary | ICD-10-CM | POA: Diagnosis not present

## 2018-12-09 DIAGNOSIS — T879 Unspecified complications of amputation stump: Secondary | ICD-10-CM

## 2018-12-09 DIAGNOSIS — Z89611 Acquired absence of right leg above knee: Secondary | ICD-10-CM

## 2018-12-09 DIAGNOSIS — I739 Peripheral vascular disease, unspecified: Secondary | ICD-10-CM

## 2018-12-09 NOTE — Assessment & Plan Note (Signed)
Impairs wound healing.

## 2018-12-09 NOTE — Assessment & Plan Note (Addendum)
blood glucose control important in reducing the progression of atherosclerotic disease. Also, involved in wound healing. On appropriate medications.  Healthy, diabetic, carb modified diet to help control glucose is of significant importance as well.

## 2018-12-09 NOTE — Progress Notes (Signed)
MRN : [id number]  [name] is a 58 y.o. (1961-04-02) male who presents with chief complaint of  Chief Complaint  Patient presents with  . Follow-up    3week follow up  .  History of Present Illness: Patient returns today in follow up of his right above-knee amputation stump.  There remains fibrinous exudate and eschar particularly on the medial aspect of the wound.  It has some mild erythema around it.  Continues to drain some.  It is very painful to the patient.  No systemic signs of infection such as fever or chills.  Current Outpatient Medications  Medication Sig Dispense Refill  . acetaminophen (TYLENOL) 325 MG tablet Take 650 mg by mouth every 6 (six) hours as needed.    Marland Kitchen albuterol (ACCUNEB) 0.63 MG/3ML nebulizer solution Take 3 mLs (0.63 mg total) by nebulization every 4 (four) hours as needed for wheezing. 200 mL 3  . amiodarone (PACERONE) 200 MG tablet Take 1 tablet (200 mg total) by mouth daily. 90 tablet 3  . aspirin EC 81 MG tablet Take 81 mg by mouth daily.    Marland Kitchen b complex-vitamin c-folic acid (NEPHRO-VITE) 0.8 MG TABS tablet Take 1 tablet by mouth daily.    . cinacalcet (SENSIPAR) 60 MG tablet Take 60 mg by mouth daily.     . ferrous sulfate 325 (65 FE) MG EC tablet Take 325 mg by mouth 3 (three) times daily with meals.    . gabapentin (NEURONTIN) 100 MG capsule Take 1 capsule (100 mg total) by mouth 3 (three) times daily. 90 capsule 0  . glipiZIDE (GLUCOTROL) 10 MG tablet Take 10 mg by mouth daily.     Marland Kitchen lactulose (CEPHULAC) 20 g packet Take 1 packet (20 g total) by mouth 3 (three) times daily. 30 each 0  . lidocaine-prilocaine (EMLA) cream Apply 1 application topically as needed (port access).    . midodrine (PROAMATINE) 10 MG tablet Take 1 tablet (10 mg total) by mouth 3 (three) times daily with meals. 30 tablet 0  . Nutritional Supplements (FEEDING SUPPLEMENT, NEPRO CARB STEADY,) LIQD Take 237 mLs by mouth 2 (two) times daily between meals.  0  . omeprazole  (PRILOSEC) 40 MG capsule Take 40 mg by mouth 2 (two) times daily.     . ondansetron (ZOFRAN) 8 MG tablet Take 8 mg by mouth every 8 (eight) hours as needed.     Marland Kitchen oxyCODONE (OXY IR/ROXICODONE) 5 MG immediate release tablet Take 1 tablet (5 mg total) by mouth every 6 (six) hours as needed for moderate pain or severe pain. 10 tablet 0  . polyethylene glycol (MIRALAX / GLYCOLAX) packet Take 17 g by mouth 2 (two) times daily.     . sevelamer carbonate (RENVELA) 800 MG tablet Take 2,400 mg by mouth 3 (three) times daily with meals.     . silver sulfADIAZINE (SILVADENE) 1 % cream Apply 1 application topically daily. 400 g 0   No current facility-administered medications for this visit.     Past Medical History:  Diagnosis Date  . (HFpEF) heart failure with preserved ejection fraction ([place])    a. 2017 EF per LHC 30-35%; b. 09/2018 Echo: EF 55-60%, no rwma, Gr2 DD, mild MR, mildly dil LA. PASP 15mmHg. small to mod circumferential pericardial eff, no tamponade.  . Anemia of chronic disease   . Atherosclerosis   . CAD (coronary artery disease)    a. 2017 LHC, severe 1v disease, occluded pRCA, L-R collaterals, moderate LV dysfunction  EF 30-35.  . Diabetes mellitus with complication ([place])   . End stage renal disease on dialysis ([place])    Labile BP  . History of ventricular tachycardia    a. 2017 polymorphic VT and cardiac arrest.  . Hypercholesteremia   . Hyperlipidemia LDL goal <70   . Hypertension   . Labile blood pressure    on HD  . NSVT (nonsustained ventricular tachycardia) ([place])    08/2018 NSVT  . PAF (paroxysmal atrial fibrillation) ([place])    a. 08/2018 CHA2DS2VASc at least 6-->initially placed on eliquis but d/c'd 2/2 anemia. On Amio.  Marland Kitchen Peripheral arterial occlusive disease (HCC)    s/p L BKA, R AKA  . S/P AKA (above knee amputation) unilateral, right ([place])    08/2018  . S/P BKA (below knee amputation) unilateral, left ([place])    2015  . Stroke [facility])     Past Surgical History:    Procedure Laterality Date  . A/V FISTULAGRAM Left 01/09/2017   Procedure: A/V Fistulagram;  Surgeon: [name], MD;  Location: [place] [facility];  Service: Cardiovascular;  Laterality: Left;  . A/V FISTULAGRAM Left 06/06/2017   Procedure: A/V Fistulagram;  Surgeon: [name], MD;  Location: [place] [facility];  Service: Cardiovascular;  Laterality: Left;  . A/V FISTULAGRAM Left 08/15/2017   Procedure: A/V Fistulagram;  Surgeon: [name], MD;  Location: [place] [facility];  Service: Cardiovascular;  Laterality: Left;  . A/V FISTULAGRAM Left 01/22/2018   Procedure: A/V FISTULAGRAM;  Surgeon: [name], MD;  Location: [place] [facility];  Service: Cardiovascular;  Laterality: Left;  . A/V SHUNT INTERVENTION N/A 06/06/2017   Procedure: A/V SHUNT INTERVENTION;  Surgeon: [name], MD;  Location: [place] [facility];  Service: Cardiovascular;  Laterality: N/A;  . AMPUTATION Right 04/25/2016   Procedure: TOE AMPUTATION;  Surgeon: Katha Cabal, MD;  Location: [facility] OR[facility];  Service: Vascular;  Laterality: Right;  . AMPUTATION Right 09/09/2018   Procedure: AMPUTATION ABOVE KNEE;  Surgeon: [name], MD;  Location: [facility];  Service: Vascular;  Laterality: Right;  . APPLICATION OF WOUND VAC Right 04/25/2016   Procedure: APPLICATION OF WOUND VAC;  Surgeon: Katha Cabal, MD;  Location: [facility];  Service: Vascular;  Laterality: Right;  . AV FISTULA PLACEMENT Left 2015  . CARDIAC CATHETERIZATION N/A 04/30/2016   Procedure: Left Heart Cath and Coronary Angiography;  Surgeon: Wellington Hampshire, MD;  Location: [place] [facility];  Service: Cardiovascular;  Laterality: N/A;  . CATARACT EXTRACTION, BILATERAL    . LEG AMPUTATION BELOW KNEE Left   . LOWER EXTREMITY ANGIOGRAPHY Right 07/24/2018   Procedure: LOWER EXTREMITY ANGIOGRAPHY;  Surgeon: [name], MD;  Location: [place] [facility];  Service: Cardiovascular;  Laterality: Right;  . LOWER EXTREMITY ANGIOGRAPHY Right  08/13/2018   Procedure: LOWER EXTREMITY ANGIOGRAPHY;  Surgeon: [name], MD;  Location: [place] [facility];  Service: Cardiovascular;  Laterality: Right;  . LOWER EXTREMITY ANGIOGRAPHY Right 09/04/2018   Procedure: Lower Extremity Angiography;  Surgeon: [name], MD;  Location: [place] [facility];  Service: Cardiovascular;  Laterality: Right;  . PERIPHERAL VASCULAR CATHETERIZATION N/A 10/03/2015   Procedure: A/V Shuntogram/Fistulagram;  Surgeon: [name], MD;  Location: [place] [facility];  Service: Cardiovascular;  Laterality: N/A;  . PERIPHERAL VASCULAR CATHETERIZATION N/A 01/16/2016   Procedure: Abdominal Aortogram w/Lower Extremity;  Surgeon: [name], MD;  Location: [place] [facility];  Service:  Cardiovascular;  Laterality: N/A;  . PERIPHERAL VASCULAR CATHETERIZATION  01/16/2016   Procedure: Lower Extremity Intervention;  Surgeon: [name], MD;  Location: [place] [facility];  Service: Cardiovascular;;  . PERIPHERAL VASCULAR CATHETERIZATION Right 04/27/2016   Procedure: Lower Extremity Angiography;  Surgeon: Katha Cabal, MD;  Location: [place] [facility];  Service: Cardiovascular;  Laterality: Right;  . PERIPHERAL VASCULAR CATHETERIZATION  04/27/2016   Procedure: Lower Extremity Intervention;  Surgeon: Katha Cabal, MD;  Location: [place] [facility];  Service: Cardiovascular;;  . TRANSMETATARSAL AMPUTATION Right 04/28/2016   Procedure: TRANSMETATARSAL AMPUTATION;  Surgeon: Albertine Patricia, DPM;  Location: [facility];  Service: Podiatry;  Laterality: Right;  . WOUND DEBRIDEMENT Right 04/25/2016   Procedure: DEBRIDEMENT WOUND;  Surgeon: Katha Cabal, MD;  Location: [facility];  Service: Vascular;  Laterality: Right;    Social History Social History   Tobacco Use  . Smoking status: Never Smoker  . Smokeless tobacco: Never Used  Substance Use Topics  . Alcohol use: No  . Drug use: No  married  Family History Family History  Problem Relation Age of  Onset  . Diabetes Father   . Prostate cancer Father   no bleeding or clotting disorders  No Known Allergies   REVIEW OF SYSTEMS (Negative unless checked)  Constitutional: [] Weight loss  [] Fever  [] Chills Cardiac: [] Chest pain   [] Chest pressure   [] Palpitations   [] Shortness of breath when laying flat   [] Shortness of breath at rest   [] Shortness of breath with exertion. Vascular:  [] Pain in legs with walking   [x] Pain in legs at rest   [x] Pain in legs when laying flat   [] Claudication   [] Pain in feet when walking  [] Pain in feet at rest  [] Pain in feet when laying flat   [] History of DVT   [] Phlebitis   [] Swelling in legs   [] Varicose veins   [x] Non-healing ulcers Pulmonary:   [] Uses home oxygen   [] Productive cough   [] Hemoptysis   [] Wheeze  [] COPD   [] Asthma Neurologic:  [] Dizziness  [] Blackouts   [] Seizures   [] History of stroke   [] History of TIA  [] Aphasia   [] Temporary blindness   [] Dysphagia   [] Weakness or numbness in arms   [x] Weakness or numbness in legs Musculoskeletal:  [x] Arthritis   [] Joint swelling   [] Joint pain   [] Low back pain Hematologic:  [] Easy bruising  [] Easy bleeding   [] Hypercoagulable state   [] Anemic   Gastrointestinal:  [] Blood in stool   [] Vomiting blood  [] Gastroesophageal reflux/heartburn   [] Abdominal pain Genitourinary:  [x] Chronic kidney disease   [] Difficult urination  [] Frequent urination  [] Burning with urination   [] Hematuria Skin:  [] Rashes   [x] Ulcers   [x] Wounds Psychological:  [] History of anxiety   []  History of major depression.  Physical Examination  BP 137/81 (BP Location: Right Arm)   Pulse (!) 16   Resp 16   Wt 223 lb 15.8 oz (101.6 kg)   BMI 35.08 kg/m  Gen:  WD/WN, NAD Head: [place]/AT, No temporalis wasting. Ear/Nose/Throat: Hearing grossly intact, nares w/o erythema or drainage Eyes: Conjunctiva clear. Sclera non-icteric Neck: Supple.  Trachea midline Pulmonary:  Good air movement, no use of accessory muscles.  Cardiac: RRR, no  JVD Vascular: Thrill present in left arm AV fistula Vessel Right Left  Radial Palpable Palpable                   Musculoskeletal: M/S 5/5 throughout.  Left BKA with prosthesis  in place.  Right above-knee amputation stump as described above Neurologic: Sensation grossly intact in extremities.  Symmetrical.  Speech is fluent.  Psychiatric: Judgment intact, Mood & affect appropriate for pt's clinical situation. Dermatologic: AKA wound as described above       Labs Recent Results (from the past 2160 hour(s))  Troponin I - Now Then Q6H     Status: Abnormal   Collection Time: 09/10/18 12:56 PM  Result Value Ref Range   Troponin I 0.05 (HH) <0.03 ng/mL    Comment: CRITICAL RESULT CALLED TO, READ BACK BY AND VERIFIED WITH  ALEKSEY FRAZER @ 1327 09/10/2018 TTG Performed at [facility], [address] [place], [place] [address]   Basic metabolic panel     Status: Abnormal   Collection Time: 09/10/18 12:56 PM  Result Value Ref Range   Sodium 138 135 - 145 mmol/L   Potassium 4.2 3.5 - 5.1 mmol/L   Chloride 94 (L) 98 - 111 mmol/L   CO2 27 22 - 32 mmol/L   Glucose, Bld 233 (H) 70 - 99 mg/dL   BUN 33 (H) 6 - 20 mg/dL   Creatinine, Ser 5.74 (H) 0.61 - 1.24 mg/dL   Calcium 9.3 8.9 - 10.3 mg/dL   GFR calc non Af Amer 10 (L) >60 mL/min   GFR calc Af Amer 12 (L) >60 mL/min   Anion gap 17 (H) 5 - 15    Comment: Performed at [facility], [address] [place], [place] [address]  CBC with Differential/Platelet     Status: Abnormal   Collection Time: 09/10/18 12:56 PM  Result Value Ref Range   WBC 13.3 (H) 4.0 - 10.5 K/uL   RBC 3.32 (L) 4.22 - 5.81 MIL/uL   Hemoglobin 9.7 (L) 13.0 - 17.0 g/dL   HCT 32.3 (L) 39.0 - 52.0 %   MCV 97.3 80.0 - 100.0 fL   MCH 29.2 26.0 - 34.0 pg   MCHC 30.0 30.0 - 36.0 g/dL   RDW 16.3 (H) 11.5 - 15.5 %   Platelets 295 150 - 400 K/uL   nRBC 0.2 0.0 - 0.2 %   Neutrophils Relative % 81 %   Neutro Abs 10.7 (H) 1.7 - 7.7 K/uL    Lymphocytes Relative 6 %   Lymphs Abs 0.8 0.7 - 4.0 K/uL   Monocytes Relative 12 %   Monocytes Absolute 1.6 (H) 0.1 - 1.0 K/uL   Eosinophils Relative 0 %   Eosinophils Absolute 0.1 0.0 - 0.5 K/uL   Basophils Relative 0 %   Basophils Absolute 0.1 0.0 - 0.1 K/uL   Immature Granulocytes 1 %   Abs Immature Granulocytes 0.14 (H) 0.00 - 0.07 K/uL    Comment: Performed at [facility], [address] [place], [place] [address]  ECHOCARDIOGRAM COMPLETE     Status: None   Collection Time: 09/10/18  1:36 PM  Result Value Ref Range   Weight 3,661.4 oz   Height 50 in   BP 93/66 mmHg  Glucose, capillary     Status: Abnormal   Collection Time: 09/10/18  5:37 PM  Result Value Ref Range   Glucose-Capillary 234 (H) 70 - 99 mg/dL  Troponin I - Now Then Q6H     Status: Abnormal   Collection Time: 09/10/18  6:03 PM  Result Value Ref Range   Troponin I 0.60 (HH) <0.03 ng/mL    Comment: CRITICAL RESULT CALLED TO, READ BACK BY AND VERIFIED WITH BARBARA THAO@2040  09/10/18 FLC Performed at  [facility], [address] [place], [place] [address]   Protime-INR     Status: Abnormal   Collection Time: 09/10/18  6:03 PM  Result Value Ref Range   Prothrombin Time 15.4 (H) 11.4 - 15.2 seconds   INR 1.23     Comment: Performed at [facility], [address] [place], [place] [address]  Lipid panel     Status: Abnormal   Collection Time: 09/10/18  6:03 PM  Result Value Ref Range   Cholesterol 137 0 - 200 mg/dL   Triglycerides 244 (H) <150 mg/dL   HDL 20 (L) >40 mg/dL   Total CHOL/HDL Ratio 6.9 RATIO   VLDL 49 (H) 0 - 40 mg/dL   LDL Cholesterol 68 0 - 99 mg/dL    Comment:        Total Cholesterol/HDL:CHD Risk Coronary Heart Disease Risk Table                     Men   Women  1/2 Average Risk   3.4   3.3  Average Risk       5.0   4.4  2 X Average Risk   9.6   7.1  3 X Average Risk  23.4   11.0        Use the calculated Patient Ratio above and the CHD Risk Table to  determine the patient's CHD Risk.        ATP III CLASSIFICATION (LDL):  <100     mg/dL   Optimal  100-129  mg/dL   Near or Above                    Optimal  130-159  mg/dL   Borderline  160-189  mg/dL   High  >190     mg/dL   Very High Performed at [facility], [address] [place], [place] [address]   Hepatic function panel     Status: Abnormal   Collection Time: 09/10/18  6:03 PM  Result Value Ref Range   Total Protein 6.5 6.5 - 8.1 g/dL   Albumin 2.8 (L) 3.5 - 5.0 g/dL   AST 22 15 - 41 U/L   ALT 10 0 - 44 U/L   Alkaline Phosphatase 117 38 - 126 U/L   Total Bilirubin 0.5 0.3 - 1.2 mg/dL   Bilirubin, Direct <0.1 0.0 - 0.2 mg/dL   Indirect Bilirubin NOT CALCULATED 0.3 - 0.9 mg/dL    Comment: Performed at [facility], [address] [place], [place] [address]  Glucose, capillary     Status: Abnormal   Collection Time: 09/10/18  9:38 PM  Result Value Ref Range   Glucose-Capillary 183 (H) 70 - 99 mg/dL   Comment 1 Notify RN   Troponin I - Now Then Q6H     Status: Abnormal   Collection Time: 09/10/18 11:21 PM  Result Value Ref Range   Troponin I 1.44 (HH) <0.03 ng/mL    Comment: CRITICAL VALUE NOTED. VALUE IS CONSISTENT WITH PREVIOUSLY REPORTED/CALLED VALUE / FLC Performed at [facility], [address] [place],  [address]   Heparin level (unfractionated)     Status: Abnormal   Collection Time: 09/10/18 11:21 PM  Result Value Ref Range   Heparin Unfractionated <0.10 (L) 0.30 - 0.70 IU/mL    Comment: (NOTE) If heparin results are below expected values, and patient dosage has  been confirmed, suggest follow up testing of antithrombin III  levels. Performed at [facility], [address] [place], [place] [address]   Basic metabolic panel     Status: Abnormal   Collection Time: 09/11/18  4:12 AM  Result Value Ref Range   Sodium 137 135 - 145 mmol/L   Potassium 4.6 3.5 - 5.1 mmol/L   Chloride 91 (L) 98 - 111 mmol/L    CO2 29 22 - 32 mmol/L   Glucose, Bld 193 (H) 70 - 99 mg/dL   BUN 42 (H) 6 - 20 mg/dL   Creatinine, Ser 6.75 (H) 0.61 - 1.24 mg/dL   Calcium 9.3 8.9 - 10.3 mg/dL   GFR calc non Af Amer 8 (L) >60 mL/min   GFR calc Af Amer 10 (L) >60 mL/min   Anion gap 17 (H) 5 - 15    Comment: Performed at [facility], [address] [place], [place] [address]  CBC     Status: Abnormal   Collection Time: 09/11/18  4:12 AM  Result Value Ref Range   WBC 13.5 (H) 4.0 - 10.5 K/uL   RBC 2.89 (L) 4.22 - 5.81 MIL/uL   Hemoglobin 8.4 (L) 13.0 - 17.0 g/dL   HCT 28.2 (L) 39.0 - 52.0 %   MCV 97.6 80.0 - 100.0 fL   MCH 29.1 26.0 - 34.0 pg   MCHC 29.8 (L) 30.0 - 36.0 g/dL   RDW 16.3 (H) 11.5 - 15.5 %   Platelets 300 150 - 400 K/uL   nRBC 0.4 (H) 0.0 - 0.2 %    Comment: Performed at [facility], [address] [place], [place] [address]  Glucose, capillary     Status: Abnormal   Collection Time: 09/11/18  7:40 AM  Result Value Ref Range   Glucose-Capillary 186 (H) 70 - 99 mg/dL  Heparin level (unfractionated)     Status: Abnormal   Collection Time: 09/11/18 10:20 AM  Result Value Ref Range   Heparin Unfractionated <0.10 (L) 0.30 - 0.70 IU/mL    Comment: RESULT REPEATED AND VERIFIED (NOTE) If heparin results are below expected values, and patient dosage has  been confirmed, suggest follow up testing of antithrombin III levels. Performed at [facility], [address] [place], [place] [address]   Glucose, capillary     Status: Abnormal   Collection Time: 09/11/18 11:43 AM  Result Value Ref Range   Glucose-Capillary 251 (H) 70 - 99 mg/dL  Heparin level (unfractionated)     Status: Abnormal   Collection Time: 09/11/18  7:07 PM  Result Value Ref Range   Heparin Unfractionated 0.25 (L) 0.30 - 0.70 IU/mL    Comment: (NOTE) If heparin results are below expected values, and patient dosage has  been confirmed, suggest follow up testing of antithrombin III levels. Performed  at [facility], [address] [place], [place] [address]   Glucose, capillary     Status: Abnormal   Collection Time: 09/11/18  9:30 PM  Result Value Ref Range   Glucose-Capillary 147 (H) 70 - 99 mg/dL  CBC     Status: Abnormal   Collection Time: 09/12/18  2:57 AM  Result Value Ref Range   WBC 13.2 (H) 4.0 - 10.5 K/uL   RBC 2.90 (L) 4.22 - 5.81 MIL/uL   Hemoglobin 8.5 (L) 13.0 - 17.0 g/dL   HCT 27.8 (L) 39.0 - 52.0 %   MCV 95.9 80.0 - 100.0 fL   MCH 29.3 26.0 - 34.0 pg   MCHC 30.6 30.0 - 36.0  g/dL   RDW 16.3 (H) 11.5 - 15.5 %   Platelets 348 150 - 400 K/uL   nRBC 0.4 (H) 0.0 - 0.2 %    Comment: Performed at [facility], [place], [place] [address]  Heparin level (unfractionated)     Status: Abnormal   Collection Time: 09/12/18  2:57 AM  Result Value Ref Range   Heparin Unfractionated 0.29 (L) 0.30 - 0.70 IU/mL    Comment: (NOTE) If heparin results are below expected values, and patient dosage has  been confirmed, suggest follow up testing of antithrombin III levels. Performed at [facility], [address] [place], [place] [address]   Glucose, capillary     Status: Abnormal   Collection Time: 09/12/18  7:59 AM  Result Value Ref Range   Glucose-Capillary 165 (H) 70 - 99 mg/dL  Heparin level (unfractionated)     Status: None   Collection Time: 09/12/18 12:51 PM  Result Value Ref Range   Heparin Unfractionated 0.37 0.30 - 0.70 IU/mL    Comment: (NOTE) If heparin results are below expected values, and patient dosage has  been confirmed, suggest follow up testing of antithrombin III levels. Performed at [facility], [address] [place], [place] [address]   Glucose, capillary     Status: Abnormal   Collection Time: 09/12/18  4:10 PM  Result Value Ref Range   Glucose-Capillary 180 (H) 70 - 99 mg/dL  Glucose, capillary     Status: Abnormal   Collection Time: 09/12/18  5:05 PM  Result Value Ref Range    Glucose-Capillary 197 (H) 70 - 99 mg/dL  Heparin level (unfractionated)     Status: None   Collection Time: 09/12/18  9:00 PM  Result Value Ref Range   Heparin Unfractionated 0.36 0.30 - 0.70 IU/mL    Comment: (NOTE) If heparin results are below expected values, and patient dosage has  been confirmed, suggest follow up testing of antithrombin III levels. Performed at [facility], [address] [place], [place] [address]   Glucose, capillary     Status: Abnormal   Collection Time: 09/12/18  9:12 PM  Result Value Ref Range   Glucose-Capillary 144 (H) 70 - 99 mg/dL  CBC     Status: Abnormal   Collection Time: 09/13/18  4:28 AM  Result Value Ref Range   WBC 15.0 (H) 4.0 - 10.5 K/uL   RBC 3.02 (L) 4.22 - 5.81 MIL/uL   Hemoglobin 8.8 (L) 13.0 - 17.0 g/dL   HCT 29.4 (L) 39.0 - 52.0 %   MCV 97.4 80.0 - 100.0 fL   MCH 29.1 26.0 - 34.0 pg   MCHC 29.9 (L) 30.0 - 36.0 g/dL   RDW 16.7 (H) 11.5 - 15.5 %   Platelets 376 150 - 400 K/uL   nRBC 0.5 (H) 0.0 - 0.2 %    Comment: Performed at [facility], [place], [place] [address]  Heparin level (unfractionated)     Status: Abnormal   Collection Time: 09/13/18  4:28 AM  Result Value Ref Range   Heparin Unfractionated <0.10 (L) 0.30 - 0.70 IU/mL    Comment: RESULT REPEATED AND VERIFIED (NOTE) If heparin results are below expected values, and patient dosage has  been confirmed, suggest follow up testing of antithrombin III levels. Performed at [facility], [address] [place], [place] [address]   Glucose, capillary     Status: Abnormal   Collection Time: 09/13/18  8:21 AM  Result Value Ref Range   Glucose-Capillary 146 (H) 70 - 99 mg/dL  Glucose, capillary     Status: Abnormal   Collection Time: 09/13/18 12:03 PM  Result Value Ref Range   Glucose-Capillary 188 (H) 70 - 99 mg/dL  Glucose, capillary     Status: Abnormal   Collection Time: 09/13/18  5:07 PM  Result Value Ref Range    Glucose-Capillary 225 (H) 70 - 99 mg/dL  Glucose, capillary     Status: Abnormal   Collection Time: 09/13/18  9:11 PM  Result Value Ref Range   Glucose-Capillary 139 (H) 70 - 99 mg/dL  CBC     Status: Abnormal   Collection Time: 09/14/18  5:13 AM  Result Value Ref Range   WBC 14.2 (H) 4.0 - 10.5 K/uL   RBC 3.05 (L) 4.22 - 5.81 MIL/uL   Hemoglobin 8.8 (L) 13.0 - 17.0 g/dL   HCT 29.2 (L) 39.0 - 52.0 %   MCV 95.7 80.0 - 100.0 fL   MCH 28.9 26.0 - 34.0 pg   MCHC 30.1 30.0 - 36.0 g/dL   RDW 17.0 (H) 11.5 - 15.5 %   Platelets 410 (H) 150 - 400 K/uL   nRBC 0.4 (H) 0.0 - 0.2 %    Comment: Performed at [facility], [address] [place], [place] [address]  Creatinine, serum     Status: Abnormal   Collection Time: 09/14/18  5:13 AM  Result Value Ref Range   Creatinine, Ser 7.18 (H) 0.61 - 1.24 mg/dL   GFR calc non Af Amer 8 (L) >60 mL/min   GFR calc Af Amer 9 (L) >60 mL/min    Comment: Performed at [facility], [address] [place], [place] [address]  Glucose, capillary     Status: Abnormal   Collection Time: 09/14/18  8:13 AM  Result Value Ref Range   Glucose-Capillary 135 (H) 70 - 99 mg/dL  Glucose, capillary     Status: Abnormal   Collection Time: 09/14/18 11:49 AM  Result Value Ref Range   Glucose-Capillary 181 (H) 70 - 99 mg/dL  Glucose, capillary     Status: Abnormal   Collection Time: 09/14/18  4:36 PM  Result Value Ref Range   Glucose-Capillary 178 (H) 70 - 99 mg/dL  Glucose, capillary     Status: Abnormal   Collection Time: 09/14/18  8:56 PM  Result Value Ref Range   Glucose-Capillary 183 (H) 70 - 99 mg/dL  Glucose, capillary     Status: Abnormal   Collection Time: 09/15/18  8:22 AM  Result Value Ref Range   Glucose-Capillary 133 (H) 70 - 99 mg/dL   Comment 1 Notify RN    Comment 2 Document in Chart   Glucose, capillary     Status: Abnormal   Collection Time: 09/15/18  2:36 PM  Result Value Ref Range   Glucose-Capillary 135 (H) 70 - 99  mg/dL  Glucose, capillary     Status: Abnormal   Collection Time: 09/15/18  5:46 PM  Result Value Ref Range   Glucose-Capillary 200 (H) 70 - 99 mg/dL  Blood Culture (routine x 2)     Status: None   Collection Time: 09/21/18  3:00 PM  Result Value Ref Range   Specimen Description BLOOD R AC    Special Requests      BOTTLES DRAWN AEROBIC AND ANAEROBIC Blood Culture results may not be optimal due to an excessive volume of blood received in culture bottles  Culture      NO GROWTH 5 DAYS Performed at [facility], [address] [place], [place] [address]    Report Status 09/26/2018 FINAL   Blood Culture (routine x 2)     Status: None   Collection Time: 09/21/18  3:00 PM  Result Value Ref Range   Specimen Description BLOOD R HAND    Special Requests      BOTTLES DRAWN AEROBIC AND ANAEROBIC Blood Culture adequate volume   Culture      NO GROWTH 5 DAYS Performed at [facility], [address] [place], [place] [address]    Report Status 09/26/2018 FINAL   Basic metabolic panel     Status: Abnormal   Collection Time: 09/21/18  3:31 PM  Result Value Ref Range   Sodium 132 (L) 135 - 145 mmol/L   Potassium 6.3 (HH) 3.5 - 5.1 mmol/L    Comment: CRITICAL RESULT CALLED TO, READ BACK BY AND VERIFIED WITH SONJIA WEAVER ON 09/21/18 AT 1621 QSD    Chloride 93 (L) 98 - 111 mmol/L   CO2 27 22 - 32 mmol/L   Glucose, Bld 254 (H) 70 - 99 mg/dL   BUN 61 (H) 6 - 20 mg/dL   Creatinine, Ser 8.07 (H) 0.61 - 1.24 mg/dL   Calcium 8.5 (L) 8.9 - 10.3 mg/dL   GFR calc non Af Amer 7 (L) >60 mL/min   GFR calc Af Amer 8 (L) >60 mL/min   Anion gap 12 5 - 15    Comment: Performed at [facility], [address] [place], [place] [address]  CBC     Status: Abnormal   Collection Time: 09/21/18  3:31 PM  Result Value Ref Range   WBC 28.1 (H) 4.0 - 10.5 K/uL   RBC 2.72 (L) 4.22 - 5.81 MIL/uL   Hemoglobin 8.0 (L) 13.0 - 17.0 g/dL   HCT 26.8 (L) 39.0 - 52.0 %   MCV 98.5 80.0 -  100.0 fL   MCH 29.4 26.0 - 34.0 pg   MCHC 29.9 (L) 30.0 - 36.0 g/dL   RDW 16.1 (H) 11.5 - 15.5 %   Platelets 576 (H) 150 - 400 K/uL   nRBC 0.0 0.0 - 0.2 %    Comment: Performed at [facility] [address] [place], [place] [address]  Troponin I - ONCE - STAT     Status: Abnormal   Collection Time: 09/21/18  3:31 PM  Result Value Ref Range   Troponin I 0.06 (HH) <0.03 ng/mL    Comment: CRITICAL RESULT CALLED TO, READ BACK BY AND VERIFIED WITH SONJIA WEAVER ON 09/21/18 AT 1621 QSD Performed at [facility], [address] [place], [place] [address]   CG4 I-STAT (Lactic acid)     Status: Abnormal   Collection Time: 09/21/18  3:46 PM  Result Value Ref Range   Lactic Acid, Venous 2.30 (HH) 0.5 - 1.9 mmol/L   Comment NOTIFIED PHYSICIAN   Influenza panel by PCR (type A & B)     Status: None   Collection Time: 09/21/18  5:17 PM  Result Value Ref Range   Influenza A By PCR NEGATIVE NEGATIVE   Influenza B By PCR NEGATIVE NEGATIVE    Comment: (NOTE) The Xpert Xpress Flu assay is intended as an aid in the diagnosis of  influenza and should not be used as a sole basis for treatment.  This  assay is FDA approved for nasopharyngeal swab specimens only. Nasal  washings and aspirates are unacceptable for Xpert Xpress Flu testing. Performed at [facility], [place], [place] [address]   CG4 I-STAT (Lactic acid)     Status: None   Collection Time: 09/21/18  7:03 PM  Result Value Ref Range   Lactic Acid, Venous 0.65 0.5 - 1.9 mmol/L  Lactic acid, plasma     Status: Abnormal   Collection Time: 09/21/18  8:47 PM  Result Value Ref Range   Lactic Acid, Venous 2.3 (HH) 0.5 - 1.9 mmol/L    Comment: CRITICAL RESULT CALLED TO, READ BACK BY AND VERIFIED WITH DAWN SONGSTER ON 09/21/18 AT 2119 QSD Performed at [facility], [address] [place], [place] [address]   Glucose, capillary     Status: Abnormal   Collection Time: 09/21/18  8:47 PM    Result Value Ref Range   Glucose-Capillary 200 (H) 70 - 99 mg/dL  MRSA PCR Screening     Status: None   Collection Time: 09/21/18 11:36 PM  Result Value Ref Range   MRSA by PCR NEGATIVE NEGATIVE    Comment:        The GeneXpert MRSA Assay (FDA approved for NASAL specimens only), is one component of a comprehensive MRSA colonization surveillance program. It is not intended to diagnose MRSA infection nor to guide or monitor treatment for MRSA infections. Performed at [facility], [address] [place], [place] [address]   Lactic acid, plasma     Status: None   Collection Time: 09/22/18 12:27 AM  Result Value Ref Range   Lactic Acid, Venous 1.9 0.5 - 1.9 mmol/L    Comment: Performed at [facility], [address] [place], [place] [address]  Vancomycin, random     Status: None   Collection Time: 09/22/18  4:18 AM  Result Value Ref Range   Vancomycin Rm 21     Comment:        Random Vancomycin therapeutic range is dependent on dosage and time of specimen collection. A peak range is 20.0-40.0 ug/mL A trough range is 5.0-15.0 ug/mL        Performed at [facility], [address] [place], [place] [address]   Glucose, capillary     Status: Abnormal   Collection Time: 09/22/18  7:55 AM  Result Value Ref Range   Glucose-Capillary 130 (H) 70 - 99 mg/dL   Comment 1 Notify RN   Renal function panel     Status: Abnormal   Collection Time: 09/22/18  8:38 AM  Result Value Ref Range   Sodium 132 (L) 135 - 145 mmol/L   Potassium 5.9 (H) 3.5 - 5.1 mmol/L   Chloride 95 (L) 98 - 111 mmol/L   CO2 25 22 - 32 mmol/L   Glucose, Bld 158 (H) 70 - 99 mg/dL   BUN 78 (H) 6 - 20 mg/dL   Creatinine, Ser 8.93 (H) 0.61 - 1.24 mg/dL   Calcium 8.2 (L) 8.9 - 10.3 mg/dL   Phosphorus 4.2 2.5 - 4.6 mg/dL   Albumin 2.8 (L) 3.5 - 5.0 g/dL   GFR calc non Af Amer 6 (L) >60 mL/min   GFR calc Af Amer 7 (L) >60 mL/min   Anion gap 12 5 - 15    Comment: Performed at  [facility], [address], [place], [place] [address]  CBC     Status: Abnormal   Collection Time: 09/22/18  8:38 AM  Result Value Ref Range  WBC 19.4 (H) 4.0 - 10.5 K/uL   RBC 2.33 (L) 4.22 - 5.81 MIL/uL   Hemoglobin 6.8 (L) 13.0 - 17.0 g/dL   HCT 23.3 (L) 39.0 - 52.0 %   MCV 100.0 80.0 - 100.0 fL   MCH 29.2 26.0 - 34.0 pg   MCHC 29.2 (L) 30.0 - 36.0 g/dL   RDW 16.3 (H) 11.5 - 15.5 %   Platelets 419 (H) 150 - 400 K/uL   nRBC 0.0 0.0 - 0.2 %    Comment: Performed at [facility], [address] [place], [place] [address]  Prepare RBC     Status: None   Collection Time: 09/22/18 12:34 PM  Result Value Ref Range   Order Confirmation      ORDER PROCESSED BY BLOOD BANK Performed at [facility], [address], [place], [place] [address]   Type and screen [place]     Status: None   Collection Time: 09/22/18  1:31 PM  Result Value Ref Range   ABO/RH(D) O POS    Antibody Screen NEG    Sample Expiration 09/25/2018    Unit Number [id number]    Blood Component Type RED CELLS,LR    Unit division 00    Status of Unit ISSUED,FINAL    Transfusion Status OK TO TRANSFUSE    Crossmatch Result Compatible    Unit Number [id number]    Blood Component Type RBC LR PHER2    Unit division 00    Status of Unit ISSUED,FINAL    Transfusion Status OK TO TRANSFUSE    Crossmatch Result      Compatible Performed at [facility], [address] [place], [place] [address]   BPAM RBC     Status: None   Collection Time: 09/22/18  1:31 PM  Result Value Ref Range   ISSUE DATE / TIME [id number]    Blood Product Unit Number [id number]    PRODUCT CODE E0336V00    Unit Type and Rh 9500    Blood Product Expiration Date 201912132359    ISSUE DATE / TIME [id number]    Blood Product Unit Number [id number]    PRODUCT CODE [id number]    Unit Type and Rh 5100    Blood Product Expiration Date 417408144818   Glucose,  capillary     Status: Abnormal   Collection Time: 09/22/18  2:29 PM  Result Value Ref Range   Glucose-Capillary 150 (H) 70 - 99 mg/dL   Comment 1 Notify RN   Troponin I - Now Then Q6H     Status: Abnormal   Collection Time: 09/22/18  3:02 PM  Result Value Ref Range   Troponin I 0.06 (HH) <0.03 ng/mL    Comment: CRITICAL VALUE NOTED. VALUE IS CONSISTENT WITH PREVIOUSLY REPORTED/CALLED VALUE DAS Performed at [facility], [address] [place], [place] [address]   Glucose, capillary     Status: Abnormal   Collection Time: 09/22/18  5:08 PM  Result Value Ref Range   Glucose-Capillary 207 (H) 70 - 99 mg/dL   Comment 1 Notify RN   Troponin I - Now Then Q6H     Status: Abnormal   Collection Time: 09/22/18 10:15 PM  Result Value Ref Range   Troponin I 0.18 (HH) <0.03 ng/mL    Comment: CRITICAL VALUE NOTED. VALUE IS CONSISTENT WITH PREVIOUSLY REPORTED/CALLED VALUE / FLC Performed at [facility], [address] [place], [place] [address]   Hemoglobin and hematocrit, blood  Status: Abnormal   Collection Time: 09/22/18 10:15 PM  Result Value Ref Range   Hemoglobin 7.3 (L) 13.0 - 17.0 g/dL   HCT 24.5 (L) 39.0 - 52.0 %    Comment: Performed at [facility], [address] [place], [place] [address]  Glucose, capillary     Status: Abnormal   Collection Time: 09/22/18 10:24 PM  Result Value Ref Range   Glucose-Capillary 220 (H) 70 - 99 mg/dL   Comment 1 Notify RN   Troponin I - Now Then Q6H     Status: Abnormal   Collection Time: 09/23/18  3:18 AM  Result Value Ref Range   Troponin I 0.25 (HH) <0.03 ng/mL    Comment: CRITICAL VALUE NOTED. VALUE IS CONSISTENT WITH PREVIOUSLY REPORTED/CALLED VALUE / FLC Performed at [facility], [address] [place], [place] [address]   CBC     Status: Abnormal   Collection Time: 09/23/18  3:18 AM  Result Value Ref Range   WBC 17.3 (H) 4.0 - 10.5 K/uL   RBC 2.50 (L) 4.22 - 5.81 MIL/uL   Hemoglobin 7.3  (L) 13.0 - 17.0 g/dL   HCT 24.8 (L) 39.0 - 52.0 %   MCV 99.2 80.0 - 100.0 fL   MCH 29.2 26.0 - 34.0 pg   MCHC 29.4 (L) 30.0 - 36.0 g/dL   RDW 16.3 (H) 11.5 - 15.5 %   Platelets 421 (H) 150 - 400 K/uL   nRBC 0.0 0.0 - 0.2 %    Comment: Performed at [facility], [address] [place], [place] [address]  Basic metabolic panel     Status: Abnormal   Collection Time: 09/23/18  3:18 AM  Result Value Ref Range   Sodium 135 135 - 145 mmol/L   Potassium 4.7 3.5 - 5.1 mmol/L   Chloride 95 (L) 98 - 111 mmol/L   CO2 29 22 - 32 mmol/L   Glucose, Bld 201 (H) 70 - 99 mg/dL   BUN 50 (H) 6 - 20 mg/dL   Creatinine, Ser 6.05 (H) 0.61 - 1.24 mg/dL   Calcium 8.5 (L) 8.9 - 10.3 mg/dL   GFR calc non Af Amer 9 (L) >60 mL/min   GFR calc Af Amer 11 (L) >60 mL/min   Anion gap 11 5 - 15    Comment: Performed at [facility], [address] [place], [place] [address]  Glucose, capillary     Status: Abnormal   Collection Time: 09/23/18  7:49 AM  Result Value Ref Range   Glucose-Capillary 154 (H) 70 - 99 mg/dL  Troponin I - Now Then Q6H     Status: Abnormal   Collection Time: 09/23/18  8:36 AM  Result Value Ref Range   Troponin I 0.26 (HH) <0.03 ng/mL    Comment: CRITICAL VALUE NOTED. VALUE IS CONSISTENT WITH PREVIOUSLY REPORTED/CALLED VALUE FMW Performed at [facility], [address] [place], [place] [address]   Glucose, capillary     Status: Abnormal   Collection Time: 09/23/18 11:34 AM  Result Value Ref Range   Glucose-Capillary 199 (H) 70 - 99 mg/dL  Troponin I - Now Then Q6H     Status: Abnormal   Collection Time: 09/23/18  2:49 PM  Result Value Ref Range   Troponin I 0.20 (HH) <0.03 ng/mL    Comment: CRITICAL VALUE NOTED. VALUE IS CONSISTENT WITH PREVIOUSLY REPORTED/CALLED VALUE FMW Performed at [facility], [address] [place], [place] [address]   Glucose, capillary  Status: Abnormal   Collection Time: 09/23/18  4:45 PM  Result Value Ref  Range   Glucose-Capillary 196 (H) 70 - 99 mg/dL  Glucose, capillary     Status: Abnormal   Collection Time: 09/23/18  6:12 PM  Result Value Ref Range   Glucose-Capillary 168 (H) 70 - 99 mg/dL   Comment 1 Notify RN   Troponin I - Now Then Q6H     Status: Abnormal   Collection Time: 09/23/18  7:52 PM  Result Value Ref Range   Troponin I 0.18 (HH) <0.03 ng/mL    Comment: CRITICAL VALUE NOTED. VALUE IS CONSISTENT WITH PREVIOUSLY REPORTED/CALLED VALUE / [place] Performed at [facility], [address] [place], [place] [address]   Glucose, capillary     Status: Abnormal   Collection Time: 09/23/18 10:25 PM  Result Value Ref Range   Glucose-Capillary 214 (H) 70 - 99 mg/dL   Comment 1 Notify RN   CBC     Status: Abnormal   Collection Time: 09/24/18  5:12 AM  Result Value Ref Range   WBC 15.4 (H) 4.0 - 10.5 K/uL   RBC 2.30 (L) 4.22 - 5.81 MIL/uL   Hemoglobin 6.7 (L) 13.0 - 17.0 g/dL   HCT 22.8 (L) 39.0 - 52.0 %   MCV 99.1 80.0 - 100.0 fL   MCH 29.1 26.0 - 34.0 pg   MCHC 29.4 (L) 30.0 - 36.0 g/dL   RDW 16.2 (H) 11.5 - 15.5 %   Platelets 405 (H) 150 - 400 K/uL   nRBC 0.1 0.0 - 0.2 %    Comment: Performed at [facility], [address] [place], [place] [address]  Basic metabolic panel     Status: Abnormal   Collection Time: 09/24/18  5:12 AM  Result Value Ref Range   Sodium 134 (L) 135 - 145 mmol/L   Potassium 5.1 3.5 - 5.1 mmol/L   Chloride 94 (L) 98 - 111 mmol/L   CO2 27 22 - 32 mmol/L   Glucose, Bld 148 (H) 70 - 99 mg/dL   BUN 73 (H) 6 - 20 mg/dL   Creatinine, Ser 7.96 (H) 0.61 - 1.24 mg/dL   Calcium 9.2 8.9 - 10.3 mg/dL   GFR calc non Af Amer 7 (L) >60 mL/min   GFR calc Af Amer 8 (L) >60 mL/min   Anion gap 13 5 - 15    Comment: Performed at [facility], [address] [place], [place] [address]  Prepare RBC     Status: None   Collection Time: 09/24/18  6:54 AM  Result Value Ref Range   Order Confirmation      ORDER PROCESSED BY BLOOD  BANK Performed at [facility], [address] [place], [place] [address]   Glucose, capillary     Status: Abnormal   Collection Time: 09/24/18  7:48 AM  Result Value Ref Range   Glucose-Capillary 119 (H) 70 - 99 mg/dL   Comment 1 Notify RN   Prepare RBC     Status: None   Collection Time: 09/24/18  8:22 AM  Result Value Ref Range   Order Confirmation      DUPLICATE REQUEST Performed at [facility], [address] [place], [place] [address]   CBC     Status: Abnormal   Collection Time: 09/24/18 10:20 AM  Result Value Ref Range   WBC 15.6 (H) 4.0 - 10.5 K/uL   RBC 2.38 (L) 4.22 - 5.81 MIL/uL  Hemoglobin 7.0 (L) 13.0 - 17.0 g/dL   HCT 23.8 (L) 39.0 - 52.0 %   MCV 100.0 80.0 - 100.0 fL   MCH 29.4 26.0 - 34.0 pg   MCHC 29.4 (L) 30.0 - 36.0 g/dL   RDW 16.1 (H) 11.5 - 15.5 %   Platelets 429 (H) 150 - 400 K/uL   nRBC 0.0 0.0 - 0.2 %    Comment: Performed at [facility], [address] [place], [place] [address]  Glucose, capillary     Status: Abnormal   Collection Time: 09/24/18  1:44 PM  Result Value Ref Range   Glucose-Capillary 139 (H) 70 - 99 mg/dL   Comment 1 Notify RN   Hemoglobin and hematocrit, blood     Status: Abnormal   Collection Time: 09/24/18  3:18 PM  Result Value Ref Range   Hemoglobin 7.9 (L) 13.0 - 17.0 g/dL   HCT 26.0 (L) 39.0 - 52.0 %    Comment: Performed at [facility], [address] [place], [place] [address]  Glucose, capillary     Status: Abnormal   Collection Time: 09/24/18  4:55 PM  Result Value Ref Range   Glucose-Capillary 272 (H) 70 - 99 mg/dL   Comment 1 Notify RN   Glucose, capillary     Status: Abnormal   Collection Time: 09/24/18 10:58 PM  Result Value Ref Range   Glucose-Capillary 144 (H) 70 - 99 mg/dL   Comment 1 Notify RN   Basic metabolic panel     Status: Abnormal   Collection Time: 09/25/18  5:23 AM  Result Value Ref Range   Sodium 135 135 - 145 mmol/L   Potassium 4.9 3.5 - 5.1  mmol/L   Chloride 95 (L) 98 - 111 mmol/L   CO2 28 22 - 32 mmol/L   Glucose, Bld 208 (H) 70 - 99 mg/dL   BUN 57 (H) 6 - 20 mg/dL   Creatinine, Ser 5.69 (H) 0.61 - 1.24 mg/dL   Calcium 9.1 8.9 - 10.3 mg/dL   GFR calc non Af Amer 10 (L) >60 mL/min   GFR calc Af Amer 12 (L) >60 mL/min   Anion gap 12 5 - 15    Comment: Performed at [facility], [address] [place], [place] [address]  CBC     Status: Abnormal   Collection Time: 09/25/18  5:23 AM  Result Value Ref Range   WBC 13.5 (H) 4.0 - 10.5 K/uL   RBC 2.50 (L) 4.22 - 5.81 MIL/uL   Hemoglobin 7.2 (L) 13.0 - 17.0 g/dL   HCT 24.4 (L) 39.0 - 52.0 %   MCV 97.6 80.0 - 100.0 fL   MCH 28.8 26.0 - 34.0 pg   MCHC 29.5 (L) 30.0 - 36.0 g/dL   RDW 17.2 (H) 11.5 - 15.5 %   Platelets 430 (H) 150 - 400 K/uL   nRBC 0.2 0.0 - 0.2 %    Comment: Performed at [facility], [address] [place], [place] [address]  Glucose, capillary     Status: Abnormal   Collection Time: 09/25/18  7:21 AM  Result Value Ref Range   Glucose-Capillary 172 (H) 70 - 99 mg/dL  Glucose, capillary     Status: Abnormal   Collection Time: 09/25/18 12:19 PM  Result Value Ref Range   Glucose-Capillary 197 (H) 70 - 99 mg/dL  Glucose, capillary     Status: Abnormal   Collection Time: 09/25/18  5:20 PM  Result Value Ref Range  Glucose-Capillary 177 (H) 70 - 99 mg/dL  Glucose, capillary     Status: Abnormal   Collection Time: 09/25/18  9:33 PM  Result Value Ref Range   Glucose-Capillary 201 (H) 70 - 99 mg/dL  Vancomycin, trough     Status: None   Collection Time: 09/26/18  3:27 AM  Result Value Ref Range   Vancomycin Tr 18 15 - 20 ug/mL    Comment: Performed at [facility], [address] [place], [place] [address]  CBC     Status: Abnormal   Collection Time: 09/26/18  3:27 AM  Result Value Ref Range   WBC 15.2 (H) 4.0 - 10.5 K/uL   RBC 2.52 (L) 4.22 - 5.81 MIL/uL   Hemoglobin 7.3 (L) 13.0 - 17.0 g/dL   HCT 25.1 (L) 39.0 - 52.0 %    MCV 99.6 80.0 - 100.0 fL   MCH 29.0 26.0 - 34.0 pg   MCHC 29.1 (L) 30.0 - 36.0 g/dL   RDW 16.6 (H) 11.5 - 15.5 %   Platelets 440 (H) 150 - 400 K/uL   nRBC 0.3 (H) 0.0 - 0.2 %    Comment: Performed at [facility], [address] [place], [place] [address]  Basic metabolic panel     Status: Abnormal   Collection Time: 09/26/18  3:27 AM  Result Value Ref Range   Sodium 133 (L) 135 - 145 mmol/L   Potassium 5.7 (H) 3.5 - 5.1 mmol/L   Chloride 94 (L) 98 - 111 mmol/L   CO2 27 22 - 32 mmol/L   Glucose, Bld 235 (H) 70 - 99 mg/dL   BUN 78 (H) 6 - 20 mg/dL   Creatinine, Ser 7.02 (H) 0.61 - 1.24 mg/dL   Calcium 9.7 8.9 - 10.3 mg/dL   GFR calc non Af Amer 8 (L) >60 mL/min   GFR calc Af Amer 9 (L) >60 mL/min   Anion gap 12 5 - 15    Comment: Performed at [facility], [address] [place], [place] [address]  Glucose, capillary     Status: Abnormal   Collection Time: 09/26/18  7:37 AM  Result Value Ref Range   Glucose-Capillary 208 (H) 70 - 99 mg/dL  Glucose, capillary     Status: Abnormal   Collection Time: 09/26/18  6:07 PM  Result Value Ref Range   Glucose-Capillary 190 (H) 70 - 99 mg/dL  Glucose, capillary     Status: Abnormal   Collection Time: 09/26/18  9:08 PM  Result Value Ref Range   Glucose-Capillary 240 (H) 70 - 99 mg/dL  CBC     Status: Abnormal   Collection Time: 09/27/18  5:52 AM  Result Value Ref Range   WBC 13.4 (H) 4.0 - 10.5 K/uL   RBC 2.45 (L) 4.22 - 5.81 MIL/uL   Hemoglobin 7.1 (L) 13.0 - 17.0 g/dL   HCT 24.3 (L) 39.0 - 52.0 %   MCV 99.2 80.0 - 100.0 fL   MCH 29.0 26.0 - 34.0 pg   MCHC 29.2 (L) 30.0 - 36.0 g/dL   RDW 16.5 (H) 11.5 - 15.5 %   Platelets 403 (H) 150 - 400 K/uL   nRBC 0.4 (H) 0.0 - 0.2 %    Comment: Performed at [facility], [address], [place], [place] [address]  Basic metabolic panel     Status: Abnormal   Collection Time: 09/27/18  5:52 AM  Result Value Ref Range   Sodium 131 (L) 135 - 145  mmol/L    Potassium 5.2 (H) 3.5 - 5.1 mmol/L   Chloride 91 (L) 98 - 111 mmol/L   CO2 28 22 - 32 mmol/L   Glucose, Bld 186 (H) 70 - 99 mg/dL   BUN 55 (H) 6 - 20 mg/dL   Creatinine, Ser 5.56 (H) 0.61 - 1.24 mg/dL   Calcium 9.5 8.9 - 10.3 mg/dL   GFR calc non Af Amer 10 (L) >60 mL/min   GFR calc Af Amer 12 (L) >60 mL/min   Anion gap 12 5 - 15    Comment: Performed at [facility], [address] [place], [place] [address]  Glucose, capillary     Status: Abnormal   Collection Time: 09/27/18  7:43 AM  Result Value Ref Range   Glucose-Capillary 198 (H) 70 - 99 mg/dL   Comment 1 Notify RN   Prepare RBC     Status: None   Collection Time: 09/27/18 11:36 AM  Result Value Ref Range   Order Confirmation      ORDER PROCESSED BY BLOOD BANK Performed at [facility], [address] [place], [place] [address]   Glucose, capillary     Status: Abnormal   Collection Time: 09/27/18 11:47 AM  Result Value Ref Range   Glucose-Capillary 236 (H) 70 - 99 mg/dL   Comment 1 Notify RN   ECHOCARDIOGRAM COMPLETE     Status: None   Collection Time: 09/27/18 12:43 PM  Result Value Ref Range   Weight 4,112 oz   Height 70 in   BP 125/66 mmHg  Type and screen [place]     Status: None   Collection Time: 09/27/18  1:38 PM  Result Value Ref Range   ABO/RH(D) O POS    Antibody Screen NEG    Sample Expiration 09/30/2018    Unit Number [id number]    Blood Component Type RED CELLS,LR    Unit division 00    Status of Unit ISSUED,FINAL    Transfusion Status OK TO TRANSFUSE    Crossmatch Result      Compatible Performed at [facility], [address] [place], [place] [address]   BPAM RBC     Status: None   Collection Time: 09/27/18  1:38 PM  Result Value Ref Range   ISSUE DATE / TIME [id number]    Blood Product Unit Number [id number]    PRODUCT CODE [id number]    Unit Type and Rh 5100    Blood Product Expiration Date 606301601093   Glucose,  capillary     Status: Abnormal   Collection Time: 09/27/18  6:50 PM  Result Value Ref Range   Glucose-Capillary 143 (H) 70 - 99 mg/dL   Comment 1 Notify RN   Glucose, capillary     Status: Abnormal   Collection Time: 09/27/18  9:33 PM  Result Value Ref Range   Glucose-Capillary 195 (H) 70 - 99 mg/dL  CBC     Status: Abnormal   Collection Time: 09/28/18  3:29 AM  Result Value Ref Range   WBC 14.2 (H) 4.0 - 10.5 K/uL   RBC 2.73 (L) 4.22 - 5.81 MIL/uL   Hemoglobin 7.9 (L) 13.0 - 17.0 g/dL   HCT 26.8 (L) 39.0 - 52.0 %   MCV 98.2 80.0 - 100.0 fL   MCH 28.9 26.0 - 34.0 pg   MCHC 29.5 (L) 30.0 - 36.0 g/dL   RDW 17.2 (H) 11.5 - 15.5 %   Platelets 440 (H) 150 -  400 K/uL   nRBC 0.5 (H) 0.0 - 0.2 %    Comment: Performed at [facility], [address] [place], [place] [address]  Basic metabolic panel     Status: Abnormal   Collection Time: 09/28/18  3:29 AM  Result Value Ref Range   Sodium 135 135 - 145 mmol/L   Potassium 4.5 3.5 - 5.1 mmol/L   Chloride 95 (L) 98 - 111 mmol/L   CO2 29 22 - 32 mmol/L   Glucose, Bld 219 (H) 70 - 99 mg/dL   BUN 42 (H) 6 - 20 mg/dL   Creatinine, Ser 4.23 (H) 0.61 - 1.24 mg/dL   Calcium 9.1 8.9 - 10.3 mg/dL   GFR calc non Af Amer 14 (L) >60 mL/min   GFR calc Af Amer 17 (L) >60 mL/min   Anion gap 11 5 - 15    Comment: Performed at [facility], [address] [place], [place] [address]  Glucose, capillary     Status: Abnormal   Collection Time: 09/28/18  7:59 AM  Result Value Ref Range   Glucose-Capillary 159 (H) 70 - 99 mg/dL   Comment 1 Notify RN   Brain natriuretic peptide     Status: Abnormal   Collection Time: 10/09/18  6:37 AM  Result Value Ref Range   B Natriuretic Peptide 3,708.0 (H) 0.0 - 100.0 pg/mL    Comment: Performed at [facility], [address], [place], [place] [address]  Blood Culture (routine x 2)     Status: None   Collection Time: 10/09/18  6:40 AM  Result Value Ref Range   Specimen Description  BLOOD BLOOD RIGHT HAND    Special Requests      BOTTLES DRAWN AEROBIC AND ANAEROBIC Blood Culture adequate volume   Culture      NO GROWTH 5 DAYS Performed at [facility], [address] [place], [place] [address]    Report Status 10/14/2018 FINAL   Influenza panel by PCR (type A & B)     Status: None   Collection Time: 10/09/18  6:41 AM  Result Value Ref Range   Influenza A By PCR NEGATIVE NEGATIVE   Influenza B By PCR NEGATIVE NEGATIVE    Comment: (NOTE) The Xpert Xpress Flu assay is intended as an aid in the diagnosis of  influenza and should not be used as a sole basis for treatment.  This  assay is FDA approved for nasopharyngeal swab specimens only. Nasal  washings and aspirates are unacceptable for Xpert Xpress Flu testing. Performed at [facility], [address] [place], [place] [address]   Basic metabolic panel     Status: Abnormal   Collection Time: 10/09/18  6:51 AM  Result Value Ref Range   Sodium 135 135 - 145 mmol/L   Potassium 5.8 (H) 3.5 - 5.1 mmol/L   Chloride 93 (L) 98 - 111 mmol/L   CO2 25 22 - 32 mmol/L   Glucose, Bld 247 (H) 70 - 99 mg/dL   BUN 76 (H) 6 - 20 mg/dL   Creatinine, Ser 8.40 (H) 0.61 - 1.24 mg/dL   Calcium 8.8 (L) 8.9 - 10.3 mg/dL   GFR calc non Af Amer 6 (L) >60 mL/min   GFR calc Af Amer 7 (L) >60 mL/min   Anion gap 17 (H) 5 - 15    Comment: Performed at [facility], [address], [place], [place] [address]  CBC     Status: Abnormal  Collection Time: 10/09/18  6:51 AM  Result Value Ref Range   WBC 19.5 (H) 4.0 - 10.5 K/uL   RBC 3.21 (L) 4.22 - 5.81 MIL/uL   Hemoglobin 9.2 (L) 13.0 - 17.0 g/dL   HCT 31.0 (L) 39.0 - 52.0 %   MCV 96.6 80.0 - 100.0 fL   MCH 28.7 26.0 - 34.0 pg   MCHC 29.7 (L) 30.0 - 36.0 g/dL   RDW 17.1 (H) 11.5 - 15.5 %   Platelets 469 (H) 150 - 400 K/uL   nRBC 0.0 0.0 - 0.2 %    Comment: Performed at [facility], [address] [place], [place] [address]  Troponin I -  ONCE - STAT     Status: Abnormal   Collection Time: 10/09/18  6:51 AM  Result Value Ref Range   Troponin I 0.05 (HH) <0.03 ng/mL    Comment: CRITICAL RESULT CALLED TO, READ BACK BY AND VERIFIED WITH JANE RYAN AT 0745 10/09/18 DAS Performed at [facility], [address], [place], [place] [address]   CG4 I-STAT (Lactic acid)     Status: None   Collection Time: 10/09/18  7:05 AM  Result Value Ref Range   Lactic Acid, Venous 1.37 0.5 - 1.9 mmol/L  Phosphorus     Status: None   Collection Time: 10/09/18  1:08 PM  Result Value Ref Range   Phosphorus 3.0 2.5 - 4.6 mg/dL    Comment: Performed at [facility], [address] [place], [place] [address]  Parathyroid hormone, intact (no Ca)     Status: Abnormal   Collection Time: 10/09/18  1:08 PM  Result Value Ref Range   PTH 242 (H) 15 - 65 pg/mL    Comment: (NOTE) Performed At: [facility] [place], [place] [id number] Rush Farmer MD SW:[id number]   Blood Culture (routine x 2)     Status: None   Collection Time: 10/09/18  2:47 PM  Result Value Ref Range   Specimen Description BLOOD PORTA CATH    Special Requests      BOTTLES DRAWN AEROBIC AND ANAEROBIC Blood Culture adequate volume   Culture      NO GROWTH 5 DAYS Performed at [facility], [address] [place], [place] [address]    Report Status 10/14/2018 FINAL   Protime-INR     Status: Abnormal   Collection Time: 10/10/18  4:03 AM  Result Value Ref Range   Prothrombin Time 16.2 (H) 11.4 - 15.2 seconds   INR 1.32     Comment: Performed at [facility], [address] [place], [place] [address]  Procalcitonin     Status: None   Collection Time: 10/10/18  4:03 AM  Result Value Ref Range   Procalcitonin 5.47 ng/mL    Comment:        Interpretation: PCT > 2 ng/mL: Systemic infection (sepsis) is likely, unless other causes are known. (NOTE)       Sepsis PCT Algorithm           Lower Respiratory Tract                                       Infection PCT Algorithm    ----------------------------     ----------------------------         PCT < 0.25 ng/mL  PCT < 0.10 ng/mL         Strongly encourage             Strongly discourage   discontinuation of antibiotics    initiation of antibiotics    ----------------------------     -----------------------------       PCT 0.25 - 0.50 ng/mL            PCT 0.10 - 0.25 ng/mL               OR       >80% decrease in PCT            Discourage initiation of                                            antibiotics      Encourage discontinuation           of antibiotics    ----------------------------     -----------------------------         PCT >= 0.50 ng/mL              PCT 0.26 - 0.50 ng/mL               AND       <80% decrease in PCT              Encourage initiation of                                             antibiotics       Encourage continuation           of antibiotics    ----------------------------     -----------------------------        PCT >= 0.50 ng/mL                  PCT > 0.50 ng/mL               AND         increase in PCT                  Strongly encourage                                      initiation of antibiotics    Strongly encourage escalation           of antibiotics                                     -----------------------------                                           PCT <= 0.25 ng/mL                                                 OR                                        >  80% decrease in PCT                                     Discontinue / Do not initiate                                             antibiotics Performed at [facility], [address] [place], [place] [address]   Cortisol-am, blood     Status: None   Collection Time: 10/10/18  4:04 AM  Result Value Ref Range   Cortisol - AM 21.2 6.7 - 22.6 ug/dL    Comment: Performed at [facility] [address]  [place], [place] [address]  CBC with Differential/Platelet     Status: Abnormal   Collection Time: 10/10/18  8:47 AM  Result Value Ref Range   WBC 12.0 (H) 4.0 - 10.5 K/uL   RBC 3.13 (L) 4.22 - 5.81 MIL/uL   Hemoglobin 8.9 (L) 13.0 - 17.0 g/dL   HCT 30.7 (L) 39.0 - 52.0 %   MCV 98.1 80.0 - 100.0 fL   MCH 28.4 26.0 - 34.0 pg   MCHC 29.0 (L) 30.0 - 36.0 g/dL   RDW 16.8 (H) 11.5 - 15.5 %   Platelets 387 150 - 400 K/uL   nRBC 0.0 0.0 - 0.2 %   Neutrophils Relative % 76 %   Neutro Abs 9.3 (H) 1.7 - 7.7 K/uL   Lymphocytes Relative 8 %   Lymphs Abs 1.0 0.7 - 4.0 K/uL   Monocytes Relative 12 %   Monocytes Absolute 1.4 (H) 0.1 - 1.0 K/uL   Eosinophils Relative 2 %   Eosinophils Absolute 0.2 0.0 - 0.5 K/uL   Basophils Relative 1 %   Basophils Absolute 0.1 0.0 - 0.1 K/uL   Immature Granulocytes 1 %   Abs Immature Granulocytes 0.07 0.00 - 0.07 K/uL    Comment: Performed at [facility], [address] [place], [place] [address]  Basic metabolic panel     Status: Abnormal   Collection Time: 10/10/18  8:47 AM  Result Value Ref Range   Sodium 139 135 - 145 mmol/L   Potassium 4.4 3.5 - 5.1 mmol/L   Chloride 98 98 - 111 mmol/L   CO2 27 22 - 32 mmol/L   Glucose, Bld 197 (H) 70 - 99 mg/dL   BUN 50 (H) 6 - 20 mg/dL   Creatinine, Ser 5.57 (H) 0.61 - 1.24 mg/dL   Calcium 9.1 8.9 - 10.3 mg/dL   GFR calc non Af Amer 10 (L) >60 mL/min   GFR calc Af Amer 12 (L) >60 mL/min   Anion gap 14 5 - 15    Comment: Performed at [facility], [address] [place], [place] [address]  Phosphorus     Status: None   Collection Time: 10/10/18  4:04 PM  Result Value Ref Range   Phosphorus 3.6 2.5 - 4.6 mg/dL    Comment: Performed at [facility], [address] [place], [place] [address]  Glucose, capillary     Status: Abnormal   Collection Time: 10/10/18  7:47 PM  Result Value Ref Range   Glucose-Capillary 138 (H) 70 - 99 mg/dL  Glucose, capillary     Status: Abnormal    Collection Time: 10/11/18  7:31 AM  Result Value Ref Range   Glucose-Capillary 156 (H) 70 - 99 mg/dL  Glucose, capillary     Status: Abnormal   Collection Time: 10/11/18 11:35 AM  Result Value Ref Range   Glucose-Capillary 154 (H) 70 - 99 mg/dL  Potassium     Status: Abnormal   Collection Time: 11/21/18  9:50 AM  Result Value Ref Range   Potassium 3.1 (L) 3.5 - 5.1 mmol/L    Comment: Performed at [facility], [address], [place], [place] [address]    Radiology No results found.  Assessment/Plan  ESRD on dialysis [facility]) Impairs wound healing.  Diabetes ([place]) blood glucose control important in reducing the progression of atherosclerotic disease. Also, involved in wound healing. On appropriate medications.   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  At this point, I think he would benefit from debridement to try to remove the dead tissue and get it to heal better.  A negative pressure dressing following debridement will be beneficial I believe.  The wound measures about 12-15 cm in length and about 5 to 6 cm in biggest width.  It has about 1 cm of depth.  I have discussed with them that this would be an outpatient procedure.  They voiced their understanding and desire to proceed.    Leotis Pain, MD  12/09/2018 11:59 AM    This note was created with Dragon medical transcription system.  Any errors from dictation are purely unintentional

## 2018-12-09 NOTE — Assessment & Plan Note (Signed)
He has a persistent wound of his right above-knee amputation stump.  At this point, I think he would benefit from debridement to try to remove the dead tissue and get it to heal better.  A negative pressure dressing following debridement will be beneficial I believe.  The wound measures about 12-15 cm in length and about 5 to 6 cm in biggest width.  It has about 1 cm of depth.  I have discussed with them that this would be an outpatient procedure.  They voiced their understanding and desire to proceed.

## 2018-12-09 NOTE — Assessment & Plan Note (Signed)
Now status post bilateral amputations

## 2018-12-11 ENCOUNTER — Telehealth (INDEPENDENT_AMBULATORY_CARE_PROVIDER_SITE_OTHER): Payer: Self-pay

## 2018-12-11 ENCOUNTER — Inpatient Hospital Stay: Admission: RE | Admit: 2018-12-11 | Payer: Medicare Other | Source: Ambulatory Visit

## 2018-12-11 NOTE — Telephone Encounter (Signed)
I received a skype from pre-admit stating the patient was unaware of his pre-admit appt for today. The patient's surgical information had been faxed to his dialysis center at [facility] on 12/09/2018 . The patient has been rescheduled for his pre-op for 12/12/2018 @ 2:15 pm.

## 2018-12-12 ENCOUNTER — Encounter
Admission: RE | Admit: 2018-12-12 | Discharge: 2018-12-12 | Disposition: A | Discharge status: Home / Self Care | Payer: Medicare Other | Source: Ambulatory Visit | Attending: Vascular Surgery | Admitting: Vascular Surgery

## 2018-12-12 ENCOUNTER — Other Ambulatory Visit: Payer: Self-pay

## 2018-12-12 DIAGNOSIS — X58XXXA Exposure to other specified factors, initial encounter: Secondary | ICD-10-CM | POA: Diagnosis not present

## 2018-12-12 DIAGNOSIS — Z01812 Encounter for preprocedural laboratory examination: Secondary | ICD-10-CM | POA: Diagnosis present

## 2018-12-12 DIAGNOSIS — T8130XA Disruption of wound, unspecified, initial encounter: Secondary | ICD-10-CM | POA: Diagnosis not present

## 2018-12-12 HISTORY — DX: Nausea with vomiting, unspecified: R11.2

## 2018-12-12 HISTORY — DX: Other specified postprocedural states: Z98.890

## 2018-12-12 LAB — CBC WITH DIFFERENTIAL/PLATELET
Abs Immature Granulocytes: 0.05 10*3/uL (ref 0.00–0.07)
BASOS ABS: 0.1 10*3/uL (ref 0.0–0.1)
Basophils Relative: 1 %
Eosinophils Absolute: 0.9 10*3/uL — ABNORMAL HIGH (ref 0.0–0.5)
Eosinophils Relative: 8 %
HCT: 49.1 % (ref 39.0–52.0)
Hemoglobin: 14.8 g/dL (ref 13.0–17.0)
Immature Granulocytes: 1 %
Lymphocytes Relative: 22 %
Lymphs Abs: 2.4 10*3/uL (ref 0.7–4.0)
MCH: 28.9 pg (ref 26.0–34.0)
MCHC: 30.1 g/dL (ref 30.0–36.0)
MCV: 95.9 fL (ref 80.0–100.0)
Monocytes Absolute: 1.2 10*3/uL — ABNORMAL HIGH (ref 0.1–1.0)
Monocytes Relative: 11 %
Neutro Abs: 6.3 10*3/uL (ref 1.7–7.7)
Neutrophils Relative %: 57 %
Platelets: 346 10*3/uL (ref 150–400)
RBC: 5.12 MIL/uL (ref 4.22–5.81)
RDW: 17.3 % — ABNORMAL HIGH (ref 11.5–15.5)
WBC: 10.9 10*3/uL — ABNORMAL HIGH (ref 4.0–10.5)
nRBC: 0 % (ref 0.0–0.2)

## 2018-12-12 LAB — BASIC METABOLIC PANEL
ANION GAP: 15 (ref 5–15)
BUN: 45 mg/dL — ABNORMAL HIGH (ref 6–20)
CO2: 27 mmol/L (ref 22–32)
Calcium: 8.4 mg/dL — ABNORMAL LOW (ref 8.9–10.3)
Chloride: 97 mmol/L — ABNORMAL LOW (ref 98–111)
Creatinine, Ser: 5.74 mg/dL — ABNORMAL HIGH (ref 0.61–1.24)
GFR calc Af Amer: 11 mL/min — ABNORMAL LOW (ref >60)
GFR calc non Af Amer: 10 mL/min — ABNORMAL LOW (ref >60)
Glucose, Bld: 63 mg/dL — ABNORMAL LOW (ref 70–99)
Potassium: 3.9 mmol/L (ref 3.5–5.1)
Sodium: 139 mmol/L (ref 135–145)

## 2018-12-12 LAB — PROTIME-INR
INR: 1 (ref 0.8–1.2)
Prothrombin Time: 13 seconds (ref 11.4–15.2)

## 2018-12-12 LAB — TYPE AND SCREEN
ABO/RH(D): O POS
Antibody Screen: NEGATIVE
Extend sample reason: TRANSFUSED

## 2018-12-12 LAB — APTT: aPTT: 31 seconds (ref 24–36)

## 2018-12-15 ENCOUNTER — Other Ambulatory Visit (INDEPENDENT_AMBULATORY_CARE_PROVIDER_SITE_OTHER): Payer: Self-pay | Admitting: Nurse Practitioner

## 2018-12-17 MED ORDER — CEFAZOLIN SODIUM-DEXTROSE 1-4 GM/50ML-% IV SOLN
1.0000 g | INTRAVENOUS | Status: AC
  Administered 2018-12-18: 2 g via INTRAVENOUS

## 2018-12-18 ENCOUNTER — Encounter: Payer: Self-pay | Admitting: *Deleted

## 2018-12-18 ENCOUNTER — Ambulatory Visit
Admission: RE | Admit: 2018-12-18 | Discharge: 2018-12-18 | Disposition: A | Discharge status: Home / Self Care | Payer: Medicare Other | Source: Ambulatory Visit | Attending: Vascular Surgery | Admitting: Vascular Surgery

## 2018-12-18 ENCOUNTER — Other Ambulatory Visit: Payer: Self-pay

## 2018-12-18 ENCOUNTER — Encounter
Admission: RE | Disposition: A | Discharge status: Home / Self Care | Payer: Self-pay | Source: Ambulatory Visit | Attending: Vascular Surgery

## 2018-12-18 ENCOUNTER — Ambulatory Visit: Payer: Medicare Other | Admitting: Anesthesiology

## 2018-12-18 DIAGNOSIS — Z992 Dependence on renal dialysis: Secondary | ICD-10-CM | POA: Diagnosis not present

## 2018-12-18 DIAGNOSIS — T8751 Necrosis of amputation stump, right upper extremity: Secondary | ICD-10-CM | POA: Diagnosis not present

## 2018-12-18 DIAGNOSIS — Z89512 Acquired absence of left leg below knee: Secondary | ICD-10-CM | POA: Insufficient documentation

## 2018-12-18 DIAGNOSIS — E1151 Type 2 diabetes mellitus with diabetic peripheral angiopathy without gangrene: Secondary | ICD-10-CM | POA: Insufficient documentation

## 2018-12-18 DIAGNOSIS — I480 Paroxysmal atrial fibrillation: Secondary | ICD-10-CM | POA: Diagnosis not present

## 2018-12-18 DIAGNOSIS — Y835 Amputation of limb(s) as the cause of abnormal reaction of the patient, or of later complication, without mention of misadventure at the time of the procedure: Secondary | ICD-10-CM | POA: Insufficient documentation

## 2018-12-18 DIAGNOSIS — I69351 Hemiplegia and hemiparesis following cerebral infarction affecting right dominant side: Secondary | ICD-10-CM | POA: Insufficient documentation

## 2018-12-18 DIAGNOSIS — Z79899 Other long term (current) drug therapy: Secondary | ICD-10-CM | POA: Insufficient documentation

## 2018-12-18 DIAGNOSIS — I1311 Hypertensive heart and chronic kidney disease without heart failure, with stage 5 chronic kidney disease, or end stage renal disease: Secondary | ICD-10-CM | POA: Diagnosis not present

## 2018-12-18 DIAGNOSIS — Z6835 Body mass index (BMI) 35.0-35.9, adult: Secondary | ICD-10-CM | POA: Diagnosis not present

## 2018-12-18 DIAGNOSIS — I2510 Atherosclerotic heart disease of native coronary artery without angina pectoris: Secondary | ICD-10-CM | POA: Diagnosis not present

## 2018-12-18 DIAGNOSIS — T8743 Infection of amputation stump, right lower extremity: Secondary | ICD-10-CM | POA: Insufficient documentation

## 2018-12-18 DIAGNOSIS — Z7984 Long term (current) use of oral hypoglycemic drugs: Secondary | ICD-10-CM | POA: Insufficient documentation

## 2018-12-18 DIAGNOSIS — Z7982 Long term (current) use of aspirin: Secondary | ICD-10-CM | POA: Insufficient documentation

## 2018-12-18 DIAGNOSIS — I5030 Unspecified diastolic (congestive) heart failure: Secondary | ICD-10-CM | POA: Insufficient documentation

## 2018-12-18 DIAGNOSIS — N186 End stage renal disease: Secondary | ICD-10-CM | POA: Diagnosis not present

## 2018-12-18 DIAGNOSIS — Z89611 Acquired absence of right leg above knee: Secondary | ICD-10-CM | POA: Insufficient documentation

## 2018-12-18 DIAGNOSIS — E1122 Type 2 diabetes mellitus with diabetic chronic kidney disease: Secondary | ICD-10-CM | POA: Diagnosis not present

## 2018-12-18 DIAGNOSIS — D631 Anemia in chronic kidney disease: Secondary | ICD-10-CM | POA: Diagnosis not present

## 2018-12-18 HISTORY — PX: APPLICATION OF WOUND VAC: SHX5189

## 2018-12-18 HISTORY — PX: WOUND DEBRIDEMENT: SHX247

## 2018-12-18 LAB — POCT I-STAT 4, (NA,K, GLUC, HGB,HCT)
Glucose, Bld: 116 mg/dL — ABNORMAL HIGH (ref 70–99)
HCT: 51 % (ref 39.0–52.0)
Hemoglobin: 17.3 g/dL — ABNORMAL HIGH (ref 13.0–17.0)
Potassium: 5.5 mmol/L — ABNORMAL HIGH (ref 3.5–5.1)
Sodium: 133 mmol/L — ABNORMAL LOW (ref 135–145)

## 2018-12-18 LAB — GLUCOSE, CAPILLARY: GLUCOSE-CAPILLARY: 108 mg/dL — AB (ref 70–99)

## 2018-12-18 LAB — TYPE AND SCREEN
ABO/RH(D): O POS
Antibody Screen: NEGATIVE

## 2018-12-18 SURGERY — DEBRIDEMENT, WOUND
Anesthesia: General | Site: Leg Upper | Laterality: Right

## 2018-12-18 MED ORDER — FAMOTIDINE 20 MG PO TABS: 20.0000 mg | ORAL_TABLET | Freq: Once | ORAL | Status: DC

## 2018-12-18 MED ORDER — FAMOTIDINE 20 MG PO TABS
ORAL_TABLET | ORAL | Status: AC
  Filled 2018-12-18: qty 1

## 2018-12-18 MED ORDER — FENTANYL CITRATE (PF) 100 MCG/2ML IJ SOLN: 25.0000 ug | INTRAMUSCULAR | Status: DC | PRN

## 2018-12-18 MED ORDER — MIDAZOLAM HCL 2 MG/2ML IJ SOLN
INTRAMUSCULAR | Status: AC
  Filled 2018-12-18: qty 2

## 2018-12-18 MED ORDER — PROPOFOL 10 MG/ML IV BOLUS
INTRAVENOUS | Status: AC
  Filled 2018-12-18: qty 20

## 2018-12-18 MED ORDER — FENTANYL CITRATE (PF) 100 MCG/2ML IJ SOLN
INTRAMUSCULAR | Status: AC
  Filled 2018-12-18: qty 2

## 2018-12-18 MED ORDER — CHLORHEXIDINE GLUCONATE CLOTH 2 % EX PADS: 6.0000 | MEDICATED_PAD | Freq: Once | CUTANEOUS | Status: DC

## 2018-12-18 MED ORDER — PROPOFOL 500 MG/50ML IV EMUL
INTRAVENOUS | Status: AC
  Filled 2018-12-18: qty 50

## 2018-12-18 MED ORDER — ONDANSETRON HCL 4 MG/2ML IJ SOLN: 4.0000 mg | Freq: Once | INTRAMUSCULAR | Status: DC | PRN

## 2018-12-18 MED ORDER — OXYCODONE HCL 5 MG PO TABS: 5.0000 mg | ORAL_TABLET | Freq: Four times a day (QID) | ORAL | 0 refills | Status: DC | PRN

## 2018-12-18 MED ORDER — CEFAZOLIN SODIUM-DEXTROSE 1-4 GM/50ML-% IV SOLN
INTRAVENOUS | Status: AC
  Filled 2018-12-18: qty 50

## 2018-12-18 MED ORDER — SODIUM CHLORIDE 0.9 % IV SOLN
INTRAVENOUS | Status: DC
  Administered 2018-12-18: 14:00:00 via INTRAVENOUS

## 2018-12-18 MED ORDER — PHENYLEPHRINE HCL 10 MG/ML IJ SOLN
INTRAMUSCULAR | Status: DC | PRN
  Administered 2018-12-18: 100 ug via INTRAVENOUS
  Administered 2018-12-18: 150 ug via INTRAVENOUS
  Administered 2018-12-18: 200 ug via INTRAVENOUS
  Administered 2018-12-18: 150 ug via INTRAVENOUS

## 2018-12-18 MED ORDER — FENTANYL CITRATE (PF) 100 MCG/2ML IJ SOLN
INTRAMUSCULAR | Status: DC | PRN
  Administered 2018-12-18 (×2): 25 ug via INTRAVENOUS

## 2018-12-18 MED ORDER — PROPOFOL 500 MG/50ML IV EMUL
INTRAVENOUS | Status: DC | PRN
  Administered 2018-12-18: 100 ug/kg/min via INTRAVENOUS

## 2018-12-18 MED ORDER — MIDAZOLAM HCL 2 MG/2ML IJ SOLN
INTRAMUSCULAR | Status: DC | PRN
  Administered 2018-12-18 (×2): .5 mg via INTRAVENOUS
  Administered 2018-12-18: 1 mg via INTRAVENOUS

## 2018-12-18 SURGICAL SUPPLY — 20 items
CANISTER SUCT 1200ML W/VALVE (MISCELLANEOUS) ×2 IMPLANT
COVER WAND RF STERILE (DRAPES) ×2 IMPLANT
DRAPE INCISE IOBAN 66X45 STRL (DRAPES) ×2 IMPLANT
DRAPE LAPAROTOMY 100X77 ABD (DRAPES) ×2 IMPLANT
ELECT REM PT RETURN 9FT ADLT (ELECTROSURGICAL) ×2
ELECTRODE REM PT RTRN 9FT ADLT (ELECTROSURGICAL) ×1 IMPLANT
GLOVE BIO SURGEON STRL SZ7 (GLOVE) ×2 IMPLANT
GOWN STRL REUS W/ TWL LRG LVL3 (GOWN DISPOSABLE) ×1 IMPLANT
GOWN STRL REUS W/ TWL XL LVL3 (GOWN DISPOSABLE) ×1 IMPLANT
GOWN STRL REUS W/TWL LRG LVL3 (GOWN DISPOSABLE) ×1
GOWN STRL REUS W/TWL XL LVL3 (GOWN DISPOSABLE) ×1
HANDLE YANKAUER SUCT BULB TIP (MISCELLANEOUS) ×1 IMPLANT
KIT TURNOVER KIT A (KITS) ×2 IMPLANT
NS IRRIG 1000ML POUR BTL (IV SOLUTION) ×2 IMPLANT
PACK BASIN MINOR ARMC (MISCELLANEOUS) ×2 IMPLANT
PULSAVAC PLUS IRRIG FAN TIP (DISPOSABLE) ×2
SOL PREP PVP 2OZ (MISCELLANEOUS) ×2
SOLUTION PREP PVP 2OZ (MISCELLANEOUS) ×1 IMPLANT
SPONGE LAP 18X18 RF (DISPOSABLE) ×2 IMPLANT
TIP FAN IRRIG PULSAVAC PLUS (DISPOSABLE) IMPLANT

## 2018-12-18 NOTE — H&P (Signed)
[facility] History & Physical Update  The patient was interviewed and re-examined.  The patient's previous History and Physical has been reviewed and is unchanged.  There is no change in the plan of care. We plan to proceed with the scheduled procedure.  Leotis Pain, MD  12/18/2018, 2:37 PM

## 2018-12-18 NOTE — Anesthesia Post-op Follow-up Note (Signed)
Anesthesia QCDR form completed.        

## 2018-12-18 NOTE — Progress Notes (Signed)
Potassium 5.5, Dr. Rosey Bath notified. Pt states he had dialysis yesterday.

## 2018-12-18 NOTE — Transfer of Care (Signed)
Immediate Anesthesia Transfer of Care Note  Patient: [name]  Procedure(s) Performed: DEBRIDEMENT WOUND (Right Leg Upper) APPLICATION OF WOUND VAC (Right Leg Upper)  Patient Location: PACU  Anesthesia Type:General  Level of Consciousness: awake and patient cooperative  Airway & Oxygen Therapy: Patient Spontanous Breathing and Patient connected to face mask oxygen  Post-op Assessment: Report given to RN and Post -op Vital signs reviewed and stable  Post vital signs: Reviewed and stable  Last Vitals:  Vitals Value Taken Time  BP 126/62 12/18/2018  3:59 PM  Temp 36.3 C 12/18/2018  3:59 PM  Pulse 75 12/18/2018  3:59 PM  Resp 13 12/18/2018  3:59 PM  SpO2 100 % 12/18/2018  3:59 PM    Last Pain:  Vitals:   12/18/18 1335  TempSrc: Oral  PainSc: 0-No pain         Complications: No apparent anesthesia complications

## 2018-12-18 NOTE — Anesthesia Preprocedure Evaluation (Signed)
Anesthesia Evaluation  Patient identified by MRN, date of birth, ID band Patient awake    Reviewed: Allergy & Precautions, NPO status   History of Anesthesia Complications (+) PONVNegative for: history of anesthetic complications  Airway Mallampati: III  TM Distance: >3 FB Neck ROM: Full    Dental  (+) Missing, Poor Dentition, Dental Advidsory Given   Pulmonary shortness of breath, with exertion and at rest, neg COPD, neg recent URI,  Dry cough for 3 months          Cardiovascular Exercise Tolerance: Poor hypertension, Pt. on medications (-) angina+ CAD, + Peripheral Vascular Disease, +CHF and + DOE  (-) Past MI, (-) Cardiac Stents and (-) CABG + dysrhythmias Atrial Fibrillation (-) Valvular Problems/Murmurs     Neuro/Psych neg Seizures  Neuromuscular disease CVA (2008, right sided weakness), Residual Symptoms negative psych ROS   GI/Hepatic negative GI ROS, Neg liver ROS, neg GERD  ,  Endo/Other  diabetes, Type 2, Oral Hypoglycemic AgentsMorbid obesity  Renal/GU Dialysis and ESRFRenal disease (10 years on dialysis)     Musculoskeletal Some back pain   Abdominal (+) + obese,   Peds  Hematology  (+) Blood dyscrasia, anemia ,   Anesthesia Other Findings Past Medical History:   Chronic kidney disease                                       Hypertension                                                 Diabetes mellitus without complication (HCC)                 Hypercholesteremia                                           Atherosclerosis                                              Stroke ([place])                                                 Anemia                                                       Reproductive/Obstetrics                            Anesthesia Physical  Anesthesia Plan  ASA: IV  Anesthesia Plan: General   Post-op Pain Management:    Induction: Intravenous  PONV Risk  Score and Plan: Propofol infusion and TIVA  Airway Management Planned: Simple Face Mask  Additional Equipment:   Intra-op Plan:   Post-operative Plan:  Informed Consent: I have reviewed the patients History and Physical, chart, labs and discussed the procedure including the risks, benefits and alternatives for the proposed anesthesia with the patient or authorized representative who has indicated his/her understanding and acceptance.     Dental Advisory Given  Plan Discussed with: CRNA, Anesthesiologist and Surgeon  Anesthesia Plan Comments:        Anesthesia Quick Evaluation

## 2018-12-18 NOTE — Op Note (Signed)
    OPERATIVE NOTE   PROCEDURE: 1. Irrigation and debridement of right above-knee amputation site for approximately 30 cm of skin and soft tissue  PRE-OPERATIVE DIAGNOSIS: Nonviable tissue and infection of right above-knee amputation site  POST-OPERATIVE DIAGNOSIS: Same as above  SURGEON: Leotis Pain, MD  ASSISTANT(S): Hezzie Bump, PA-C  ANESTHESIA: general  ESTIMATED BLOOD LOSS: 5 cc  FINDING(S): None  SPECIMEN(S):  none  INDICATIONS:   [name] is a 58 y.o. male who presents with some necrotic eschar and nonviable fat in his right above-knee amputation wound.  Debridement is performed to remove all nonviable tissue and promote wound healing as well as reduce infection.  Risks and benefits are discussed and informed consent is obtained. An assistant was present during the procedure to help facilitate the exposure and expedite the procedure.  DESCRIPTION: After obtaining full informed written consent, the patient was brought back to the operating room and placed supine upon the operating table.  The patient received IV antibiotics prior to induction.  After obtaining adequate anesthesia, the patient was prepped and draped in the standard fashion for. The assistant provided retraction and mobilization to help facilitate exposure and expedite the procedure throughout the entire procedure.  This included following suture, using retractors, and optimizing lighting.  The wound was then opened and excisional debridement was performed to the skin and soft tissue to remove all clearly non-viable tissue.  The tissue was taken back to bleeding tissue that appeared viable.  The debridement was performed with scalpel and Metzenbaum scissors and encompassed an area of approximately 30-35 cm2 with the wound being 10 to 11 cm in length and a little over 3 cm in width.  The wound was irrigated copiously with 1 L of pulse lavage saline.  After all clearly non-viable tissue was removed, a negative  pressure dressing was cut to fit the wound.  Strips of [place] were used and a good occlusive seal was obtained with suction was connected. The patient was then awakened from anesthesia and taken to the recovery room in stable condition having tolerated the procedure well.  COMPLICATIONS: none  CONDITION: stable  Leotis Pain  12/18/2018, 3:53 PM   This note was created with Dragon Medical transcription system. Any errors in dictation are purely unintentional.

## 2018-12-19 ENCOUNTER — Encounter: Payer: Self-pay | Admitting: Vascular Surgery

## 2018-12-19 NOTE — Anesthesia Postprocedure Evaluation (Signed)
Anesthesia Post Note  Patient: [name]  Procedure(s) Performed: DEBRIDEMENT WOUND (Right Leg Upper) APPLICATION OF WOUND VAC (Right Leg Upper)  Patient location during evaluation: PACU Anesthesia Type: General Level of consciousness: awake and alert Pain management: pain level controlled Vital Signs Assessment: post-procedure vital signs reviewed and stable Respiratory status: spontaneous breathing, nonlabored ventilation, respiratory function stable and patient connected to nasal cannula oxygen Cardiovascular status: blood pressure returned to baseline and stable Postop Assessment: no apparent nausea or vomiting Anesthetic complications: no     Last Vitals:  Vitals:   12/18/18 1629 12/18/18 1653  BP: 116/75 132/75  Pulse: 75 75  Resp: 13 16  Temp: (!) 36.2 C (!) 36.4 C  SpO2: 97% 100%    Last Pain:  Vitals:   12/18/18 1653  TempSrc:   PainSc: 0-No pain                 Precious Haws Chari Parmenter

## 2018-12-24 ENCOUNTER — Telehealth (INDEPENDENT_AMBULATORY_CARE_PROVIDER_SITE_OTHER): Payer: Self-pay | Admitting: Vascular Surgery

## 2018-12-24 NOTE — Telephone Encounter (Signed)
Per Arna Medici patient has the right to refuse home health but she is concerned that the leg will become infected if the dressing is not done properly. Also, that the black foam used in wound vac could grow into patient leg and cause patient to need more surgeries. Wanted to know if the wife knew how to change wound vac.   Called patient using interpreter ID# [phone], Asked patient if he had refused home health, patient stated yes. Told patient consequences of not changing the wound vac properly (stated above). Patient verbalized understanding. Asked patient if his wife knew how to change wound vac, because that's who he told home health was going to do it from now on, he said yes that she had been trained. Patient states that his wife does a better job and that he doesn't want or need any help from home health for that.   I spoke with Caryl Pina and let her know if was ok to DC for non-compliance. AS, CMA

## 2018-12-24 NOTE — Telephone Encounter (Signed)
Caryl Pina from [facility] went to patient house today for wound care. Patient has wound vac. Patient is refusing home health.   Homehealth went out Monday to change wound vac-asked patient several times if he was ok and patient would reply yes. At end of visit patient told wife he didn't want to do this again. Home health went out today and patient refused home health bc he states it was to painful to have wound vac changed monday. Patient said his wife would change his wound vac. Nurse asked wife if she knew how to do this and she said she did but nurse is concerned that that is not so.   Patient is not taking pain medication except at night before bed due to the med causing patient to be drowsy. Patient is also not taking any tylenol. Patient pain rate is reported to be 8-10 constantly.

## 2019-01-02 ENCOUNTER — Other Ambulatory Visit: Payer: Self-pay

## 2019-01-02 ENCOUNTER — Encounter (INDEPENDENT_AMBULATORY_CARE_PROVIDER_SITE_OTHER): Payer: Self-pay | Admitting: Nurse Practitioner

## 2019-01-02 ENCOUNTER — Ambulatory Visit (INDEPENDENT_AMBULATORY_CARE_PROVIDER_SITE_OTHER): Payer: Medicare Other | Admitting: Nurse Practitioner

## 2019-01-02 VITALS — BP 155/73 | HR 69 | Resp 12 | Ht 67.0 in | Wt 222.7 lb

## 2019-01-02 DIAGNOSIS — Z992 Dependence on renal dialysis: Secondary | ICD-10-CM

## 2019-01-02 DIAGNOSIS — E1152 Type 2 diabetes mellitus with diabetic peripheral angiopathy with gangrene: Secondary | ICD-10-CM

## 2019-01-02 DIAGNOSIS — T879 Unspecified complications of amputation stump: Secondary | ICD-10-CM

## 2019-01-02 DIAGNOSIS — N186 End stage renal disease: Secondary | ICD-10-CM | POA: Diagnosis not present

## 2019-01-02 DIAGNOSIS — Z89611 Acquired absence of right leg above knee: Secondary | ICD-10-CM | POA: Diagnosis not present

## 2019-01-02 DIAGNOSIS — Z7982 Long term (current) use of aspirin: Secondary | ICD-10-CM

## 2019-01-02 MED ORDER — CEPHALEXIN 500 MG PO CAPS: 500.0000 mg | ORAL_CAPSULE | Freq: Two times a day (BID) | ORAL | 0 refills | Status: DC

## 2019-01-02 MED ORDER — AQUACEL EXTRA HYDROFIBER 6X6 EX PADS: 1.0000 | MEDICATED_PAD | CUTANEOUS | 3 refills | Status: DC

## 2019-01-08 ENCOUNTER — Encounter (INDEPENDENT_AMBULATORY_CARE_PROVIDER_SITE_OTHER): Payer: Self-pay | Admitting: Nurse Practitioner

## 2019-01-08 NOTE — Progress Notes (Signed)
SUBJECTIVE:  Patient ID: [name], male    DOB: Nov 24, 1960, 58 y.o.   MRN: [id number] Chief Complaint  Patient presents with  . Follow-up    HPI  [name] is a 58 y.o. male that returns following debridement and wound VAC placement for complication with his right lower extremity wound of his above-knee amputation.  The patient originally had a wound VAC placed in the OR following debridement, however when home health came to his house to change it he notify them that he did not want home health and that his wife changed wound.  Our office contacted him to notify him of the possible complications of not allowing a trained wound nurse to change his dressing such as infection, worsening of the wound, and delayed healing.  The patient still stated that he did not want home health to change his dressings due to the pain and he felt that his wife would be able to do a better job.  Today the wound appears wet with erythema around the edges.  The patient also states that he stopped utilizing his wound VAC several days ago due to the fact that he felt that there was no drainage.  There is some granulation tissue at the edges of the wound.  He denies any fever, chills, nausea, vomiting or diarrhea.  He denies any chest pain shortness of breath.  Past Medical History:  Diagnosis Date  . (HFpEF) heart failure with preserved ejection fraction ([place])    a. 2017 EF per LHC 30-35%; b. 09/2018 Echo: EF 55-60%, no rwma, Gr2 DD, mild MR, mildly dil LA. PASP 38mmHg. small to mod circumferential pericardial eff, no tamponade.  . Anemia of chronic disease   . Atherosclerosis   . CAD (coronary artery disease)    a. 2017 LHC, severe 1v disease, occluded pRCA, L-R collaterals, moderate LV dysfunction EF 30-35.  . Diabetes mellitus with complication ([place])   . End stage renal disease on dialysis ([place])    Labile BP  . History of ventricular tachycardia    a. 2017 polymorphic VT and cardiac arrest.  .  Hypercholesteremia   . Hyperlipidemia LDL goal <70   . Hypertension   . Labile blood pressure    on HD  . NSVT (nonsustained ventricular tachycardia) ([place])    08/2018 NSVT  . PAF (paroxysmal atrial fibrillation) ([place])    a. 08/2018 CHA2DS2VASc at least 6-->initially placed on eliquis but d/c'd 2/2 anemia. On Amio.  Marland Kitchen Peripheral arterial occlusive disease (HCC)    s/p L BKA, R AKA  . PONV (postoperative nausea and vomiting)   . S/P AKA (above knee amputation) unilateral, right ([place])    08/2018  . S/P BKA (below knee amputation) unilateral, left ([place])    2015  . Stroke [facility])     Past Surgical History:  Procedure Laterality Date  . A/V FISTULAGRAM Left 01/09/2017   Procedure: A/V Fistulagram;  Surgeon: [name], MD;  Location: [place] CV LAB;  Service: Cardiovascular;  Laterality: Left;  . A/V FISTULAGRAM Left 06/06/2017   Procedure: A/V Fistulagram;  Surgeon: [name], MD;  Location: [place] CV LAB;  Service: Cardiovascular;  Laterality: Left;  . A/V FISTULAGRAM Left 08/15/2017   Procedure: A/V Fistulagram;  Surgeon: [name], MD;  Location: [place] CV LAB;  Service: Cardiovascular;  Laterality: Left;  . A/V FISTULAGRAM Left 01/22/2018   Procedure: A/V FISTULAGRAM;  Surgeon: [name], MD;  Location: [facility] INVASIVE CV  LAB;  Service: Cardiovascular;  Laterality: Left;  . A/V SHUNT INTERVENTION N/A 06/06/2017   Procedure: A/V SHUNT INTERVENTION;  Surgeon: [name], MD;  Location: [place] CV LAB;  Service: Cardiovascular;  Laterality: N/A;  . AMPUTATION Right 04/25/2016   Procedure: TOE AMPUTATION;  Surgeon: Katha Cabal, MD;  Location: [facility] ORS;  Service: Vascular;  Laterality: Right;  . AMPUTATION Right 09/09/2018   Procedure: AMPUTATION ABOVE KNEE;  Surgeon: [name], MD;  Location: [facility] ORS;  Service: Vascular;  Laterality: Right;  . APPLICATION OF WOUND VAC Right 04/25/2016   Procedure: APPLICATION OF WOUND VAC;  Surgeon: Katha Cabal, MD;  Location: [facility] ORS;  Service: Vascular;  Laterality: Right;  . APPLICATION OF WOUND VAC Right 12/18/2018   Procedure: APPLICATION OF WOUND VAC;  Surgeon: [name], MD;  Location: [facility] ORS;  Service: Vascular;  Laterality: Right;  wound vac  . AV FISTULA PLACEMENT Left 2015  . CARDIAC CATHETERIZATION N/A 04/30/2016   Procedure: Left Heart Cath and Coronary Angiography;  Surgeon: Wellington Hampshire, MD;  Location: [place] CV LAB;  Service: Cardiovascular;  Laterality: N/A;  . CATARACT EXTRACTION, BILATERAL    . LEG AMPUTATION BELOW KNEE Left   . LOWER EXTREMITY ANGIOGRAPHY Right 07/24/2018   Procedure: LOWER EXTREMITY ANGIOGRAPHY;  Surgeon: [name], MD;  Location: [place] CV LAB;  Service: Cardiovascular;  Laterality: Right;  . LOWER EXTREMITY ANGIOGRAPHY Right 08/13/2018   Procedure: LOWER EXTREMITY ANGIOGRAPHY;  Surgeon: [name], MD;  Location: [place] CV LAB;  Service: Cardiovascular;  Laterality: Right;  . LOWER EXTREMITY ANGIOGRAPHY Right 09/04/2018   Procedure: Lower Extremity Angiography;  Surgeon: [name], MD;  Location: [place] CV LAB;  Service: Cardiovascular;  Laterality: Right;  . PERIPHERAL VASCULAR CATHETERIZATION N/A 10/03/2015   Procedure: A/V Shuntogram/Fistulagram;  Surgeon: [name], MD;  Location: [place] CV LAB;  Service: Cardiovascular;  Laterality: N/A;  . PERIPHERAL VASCULAR CATHETERIZATION N/A 01/16/2016   Procedure: Abdominal Aortogram w/Lower Extremity;  Surgeon: [name], MD;  Location: [place] CV LAB;  Service: Cardiovascular;  Laterality: N/A;  . PERIPHERAL VASCULAR CATHETERIZATION  01/16/2016   Procedure: Lower Extremity Intervention;  Surgeon: [name], MD;  Location: [place] CV LAB;  Service: Cardiovascular;;  . PERIPHERAL VASCULAR CATHETERIZATION Right 04/27/2016   Procedure: Lower Extremity Angiography;  Surgeon: Katha Cabal, MD;  Location: [place] CV LAB;  Service:  Cardiovascular;  Laterality: Right;  . PERIPHERAL VASCULAR CATHETERIZATION  04/27/2016   Procedure: Lower Extremity Intervention;  Surgeon: Katha Cabal, MD;  Location: [place] CV LAB;  Service: Cardiovascular;;  . TRANSMETATARSAL AMPUTATION Right 04/28/2016   Procedure: TRANSMETATARSAL AMPUTATION;  Surgeon: Albertine Patricia, DPM;  Location: [facility] ORS;  Service: Podiatry;  Laterality: Right;  . WOUND DEBRIDEMENT Right 04/25/2016   Procedure: DEBRIDEMENT WOUND;  Surgeon: Katha Cabal, MD;  Location: [facility] ORS;  Service: Vascular;  Laterality: Right;  . WOUND DEBRIDEMENT Right 12/18/2018   Procedure: DEBRIDEMENT WOUND;  Surgeon: [name], MD;  Location: [facility] ORS;  Service: Vascular;  Laterality: Right;  right stump    Social History   Socioeconomic History  . Marital status: Married    Spouse name: Not on file  . Number of children: Not on file  . Years of education: Not on file  . Highest education level: Not on file  Occupational History  . Not on file  Social Needs  . Financial resource strain:  Not on file  . Food insecurity:    Worry: Patient refused    Inability: Patient refused  . Transportation needs:    Medical: No    Non-medical: No  Tobacco Use  . Smoking status: Never Smoker  . Smokeless tobacco: Never Used  Substance and Sexual Activity  . Alcohol use: No  . Drug use: No  . Sexual activity: Not on file  Lifestyle  . Physical activity:    Days per week: Not on file    Minutes per session: Not on file  . Stress: Not at all  Relationships  . Social connections:    Talks on phone: More than three times a week    Gets together: Three times a week    Attends religious service: Not on file    Active member of club or organization: Not on file    Attends meetings of clubs or organizations: Not on file    Relationship status: Not on file  . Intimate partner violence:    Fear of current or ex partner: No    Emotionally abused: Not on file    Physically  abused: Not on file    Forced sexual activity: Not on file  Other Topics Concern  . Not on file  Social History Narrative  . Not on file    Family History  Problem Relation Age of Onset  . Diabetes Father   . Prostate cancer Father     No Known Allergies   Review of Systems   Review of Systems: Negative Unless Checked Constitutional: [] Weight loss  [] Fever  [] Chills Cardiac: [] Chest pain   []  Atrial Fibrillation  [] Palpitations   [] Shortness of breath when laying flat   [] Shortness of breath with exertion. [] Shortness of breath at rest Vascular:  [] Pain in legs with walking   [] Pain in legs with standing [] Pain in legs when laying flat   [] Claudication    [] Pain in feet when laying flat    [] History of DVT   [] Phlebitis   [] Swelling in legs   [] Varicose veins   [] Non-healing ulcers Pulmonary:   [] Uses home oxygen   [] Productive cough   [] Hemoptysis   [] Wheeze  [x] COPD   [] Asthma Neurologic:  [] Dizziness   [] Seizures  [] Blackouts [] History of stroke   [] History of TIA  [] Aphasia   [] Temporary Blindness   [] Weakness or numbness in arm   [x] Weakness or numbness in leg Musculoskeletal:   [] Joint swelling   [] Joint pain   [] Low back pain  []  History of Knee Replacement [] Arthritis [] back Surgeries  []  Spinal Stenosis    Hematologic:  [] Easy bruising  [] Easy bleeding   [] Hypercoagulable state   [x] Anemic Gastrointestinal:  [] Diarrhea   [] Vomiting  [] Gastroesophageal reflux/heartburn   [] Difficulty swallowing. [] Abdominal pain Genitourinary:  [x] Chronic kidney disease   [] Difficult urination  [] Anuric   [] Blood in urine [] Frequent urination  [] Burning with urination   [] Hematuria Skin:  [] Rashes   [] Ulcers [x] Wounds Psychological:  [] History of anxiety   []  History of major depression  []  Memory Difficulties      OBJECTIVE:   Physical Exam  BP (!) 155/73 (BP Location: Left Arm, Patient Position: Sitting, Cuff Size: Large)   Pulse 69   Resp 12   Ht 5\' 7"  (1.702 m)   Wt 222 lb 10.6 oz  (101 kg)   BMI 34.87 kg/m   Gen: WD/WN, NAD Head: [place]/AT, No temporalis wasting.  Ear/Nose/Throat: Hearing grossly intact, nares w/o erythema or drainage Eyes: PER, EOMI,  sclera nonicteric.  Neck: Supple, no masses.  No JVD.  Pulmonary:  Good air movement, no use of accessory muscles.  Cardiac: RRR Vascular:  Good thrill and bruit Vessel Right Left  Radial Palpable Palpable  Gastrointestinal: soft, non-distended. No guarding/no peritoneal signs.  Musculoskeletal: Bilateral amputee Neurologic: Pain and light touch intact in extremities.  Symmetrical.  Speech is fluent. Motor exam as listed above. Psychiatric: Judgment intact, Mood & affect appropriate for pt's clinical situation. Dermatologic: No Venous rashes. No Ulcers Noted.  No changes consistent with cellulitis. Lymph : No Cervical lymphadenopathy, no lichenification or skin changes of chronic lymphedema.       ASSESSMENT AND PLAN:  1. Complication of above knee amputation stump ([place]) Patient's wound is wet and not necessarily foul-smelling.  There is some granulation tissue near the edges.  We will do Keflex in order to see about drying of the wound and helping to facilitate small wound healing.  I have also instructed the wife to purchase Aquasol AG over-the-counter to utilize with the wound.  This is due to the fact that the patient has refused nursing care services, as they would provide dressing services as well.  I feel that the wound VAC would likely be a good option to continue with for the patient, however he has decided that he no longer wants to use as he feels that there is no longer any drainage.  There is also concerned that he has not allowed home health changes however he has had his wife to change the dressings without any known training that we are aware of.  Patient will follow-up in 3 weeks.  2. Type 2 diabetes mellitus with diabetic peripheral angiopathy with gangrene, unspecified whether long term insulin use  ([place]) Continue hypoglycemic medications as already ordered, these medications have been reviewed and there are no changes at this time.  Hgb A1C to be monitored as already arranged by primary service   3. End stage renal disease on dialysis [facility]) Currently the patient has no issues with dialysis.  We will continue on his regularly scheduled follow-up schedule.   Current Outpatient Medications on File Prior to Visit  Medication Sig Dispense Refill  . acetaminophen (TYLENOL) 325 MG tablet Take 650 mg by mouth every 6 (six) hours as needed.    Marland Kitchen amiodarone (PACERONE) 200 MG tablet Take 1 tablet (200 mg total) by mouth daily. 90 tablet 3  . aspirin EC 81 MG tablet Take 81 mg by mouth daily.    Marland Kitchen b complex-vitamin c-folic acid (NEPHRO-VITE) 0.8 MG TABS tablet Take 1 tablet by mouth daily.    . cinacalcet (SENSIPAR) 60 MG tablet Take 60 mg by mouth daily.     . ferrous sulfate 325 (65 FE) MG EC tablet Take 325 mg by mouth 3 (three) times daily with meals.    . gabapentin (NEURONTIN) 100 MG capsule Take 1 capsule (100 mg total) by mouth 3 (three) times daily. (Patient taking differently: Take 100 mg by mouth 3 (three) times daily as needed. ) 90 capsule 0  . glipiZIDE (GLUCOTROL) 10 MG tablet Take 10 mg by mouth daily.     Marland Kitchen lactulose (CEPHULAC) 20 g packet Take 1 packet (20 g total) by mouth 3 (three) times daily. (Patient taking differently: Take 20 g by mouth daily as needed. ) 30 each 0  . lidocaine-prilocaine (EMLA) cream Apply 1 application topically as needed (port access).    . midodrine (PROAMATINE) 10 MG tablet Take 1 tablet (10 mg  total) by mouth 3 (three) times daily with meals. 30 tablet 0  . omeprazole (PRILOSEC) 40 MG capsule Take 40 mg by mouth 2 (two) times daily.     . ondansetron (ZOFRAN) 8 MG tablet Take 8 mg by mouth every 8 (eight) hours as needed.     Marland Kitchen oxyCODONE (OXY IR/ROXICODONE) 5 MG immediate release tablet Take 1 tablet (5 mg total) by mouth every 6 (six) hours as  needed for moderate pain or severe pain. 20 tablet 0  . polyethylene glycol (MIRALAX / GLYCOLAX) packet Take 17 g by mouth 2 (two) times daily.     . sevelamer carbonate (RENVELA) 800 MG tablet Take 2,400 mg by mouth 3 (three) times daily with meals.     Marland Kitchen albuterol (ACCUNEB) 0.63 MG/3ML nebulizer solution Take 3 mLs (0.63 mg total) by nebulization every 4 (four) hours as needed for wheezing. (Patient not taking: Reported on 12/18/2018) 200 mL 3  . Nutritional Supplements (FEEDING SUPPLEMENT, NEPRO CARB STEADY,) LIQD Take 237 mLs by mouth 2 (two) times daily between meals.  0  . silver sulfADIAZINE (SILVADENE) 1 % cream Apply 1 application topically daily. (Patient not taking: Reported on 01/02/2019) 400 g 0   No current facility-administered medications on file prior to visit.     There are no Patient Instructions on file for this visit. No follow-ups on file.   Kris Hartmann, NP  This note was completed with Sales executive.  Any errors are purely unintentional.

## 2019-01-20 ENCOUNTER — Encounter (INDEPENDENT_AMBULATORY_CARE_PROVIDER_SITE_OTHER): Payer: Self-pay | Admitting: Nurse Practitioner

## 2019-01-20 ENCOUNTER — Other Ambulatory Visit: Payer: Self-pay

## 2019-01-20 ENCOUNTER — Ambulatory Visit (INDEPENDENT_AMBULATORY_CARE_PROVIDER_SITE_OTHER): Payer: Medicare Other | Admitting: Nurse Practitioner

## 2019-01-20 VITALS — BP 101/64 | HR 78 | Resp 16

## 2019-01-20 DIAGNOSIS — N186 End stage renal disease: Secondary | ICD-10-CM

## 2019-01-20 DIAGNOSIS — Z89611 Acquired absence of right leg above knee: Secondary | ICD-10-CM

## 2019-01-20 DIAGNOSIS — E1152 Type 2 diabetes mellitus with diabetic peripheral angiopathy with gangrene: Secondary | ICD-10-CM | POA: Diagnosis not present

## 2019-01-20 DIAGNOSIS — Z992 Dependence on renal dialysis: Secondary | ICD-10-CM

## 2019-01-20 DIAGNOSIS — T879 Unspecified complications of amputation stump: Secondary | ICD-10-CM

## 2019-01-20 DIAGNOSIS — I255 Ischemic cardiomyopathy: Secondary | ICD-10-CM

## 2019-01-20 MED ORDER — CEPHALEXIN 500 MG PO CAPS: 500.0000 mg | ORAL_CAPSULE | Freq: Three times a day (TID) | ORAL | 0 refills | Status: DC

## 2019-01-20 MED ORDER — SILVER SULFADIAZINE 1 % EX CREA: 1.0000 "application " | TOPICAL_CREAM | Freq: Every day | CUTANEOUS | 0 refills | Status: DC

## 2019-01-20 MED ORDER — TRAMADOL HCL 50 MG PO TABS: 100.0000 mg | ORAL_TABLET | Freq: Four times a day (QID) | ORAL | 0 refills | Status: AC | PRN

## 2019-01-27 ENCOUNTER — Other Ambulatory Visit: Payer: Self-pay

## 2019-01-27 ENCOUNTER — Encounter: Payer: Medicare Other | Attending: Physician Assistant | Admitting: Physician Assistant

## 2019-01-27 DIAGNOSIS — E11622 Type 2 diabetes mellitus with other skin ulcer: Secondary | ICD-10-CM | POA: Diagnosis present

## 2019-01-27 DIAGNOSIS — E1122 Type 2 diabetes mellitus with diabetic chronic kidney disease: Secondary | ICD-10-CM | POA: Diagnosis not present

## 2019-01-27 DIAGNOSIS — E1151 Type 2 diabetes mellitus with diabetic peripheral angiopathy without gangrene: Secondary | ICD-10-CM | POA: Diagnosis not present

## 2019-01-27 DIAGNOSIS — I480 Paroxysmal atrial fibrillation: Secondary | ICD-10-CM | POA: Insufficient documentation

## 2019-01-27 DIAGNOSIS — Z992 Dependence on renal dialysis: Secondary | ICD-10-CM | POA: Insufficient documentation

## 2019-01-27 DIAGNOSIS — Z8673 Personal history of transient ischemic attack (TIA), and cerebral infarction without residual deficits: Secondary | ICD-10-CM | POA: Diagnosis not present

## 2019-01-27 DIAGNOSIS — Z87891 Personal history of nicotine dependence: Secondary | ICD-10-CM | POA: Insufficient documentation

## 2019-01-27 DIAGNOSIS — I132 Hypertensive heart and chronic kidney disease with heart failure and with stage 5 chronic kidney disease, or end stage renal disease: Secondary | ICD-10-CM | POA: Insufficient documentation

## 2019-01-27 DIAGNOSIS — N186 End stage renal disease: Secondary | ICD-10-CM | POA: Insufficient documentation

## 2019-01-27 DIAGNOSIS — I5042 Chronic combined systolic (congestive) and diastolic (congestive) heart failure: Secondary | ICD-10-CM | POA: Diagnosis not present

## 2019-01-27 DIAGNOSIS — Z89512 Acquired absence of left leg below knee: Secondary | ICD-10-CM | POA: Diagnosis not present

## 2019-01-27 DIAGNOSIS — Z89611 Acquired absence of right leg above knee: Secondary | ICD-10-CM | POA: Diagnosis not present

## 2019-01-27 DIAGNOSIS — L97812 Non-pressure chronic ulcer of other part of right lower leg with fat layer exposed: Secondary | ICD-10-CM | POA: Diagnosis not present

## 2019-01-27 NOTE — Progress Notes (Signed)
BANYAN, GOODCHILD ([id number]) Visit Report for 01/27/2019 Allergy List Details Patient Name: [name], [name] Date of Service: 01/27/2019 10:15 AM Medical Record Number: [id number] Patient Account Number: [id number] Date of Birth/Sex: 12/11/1960 (58 y.o. M) Treating RN: Cornell Barman Primary Care Armilda Vanderlinden: [name] Other Clinician: Referring Hamp Moreland: Leotis Pain Treating Elta Angell/Extender: [name], HOYT Weeks in Treatment: 0 Allergies Active Allergies No Known Drug Allergies Allergy Notes Electronic Signature(s) Signed: 01/27/2019 4:24:58 PM By: Gretta Cool, BSN, RN, CWS, Kim RN, BSN Entered By: Gretta Cool, BSN, RN, CWS, Kim on 01/27/2019 10:59:46 [name] ([id number]) -------------------------------------------------------------------------------- Arrival Information Details Patient Name: [name], [name] Date of Service: 01/27/2019 10:15 AM Medical Record Number: [id number] Patient Account Number: [id number] Date of Birth/Sex: [birth year] (58 y.o. M) Treating RN: Cornell Barman Primary Care Roselynne Lortz: [name] Other Clinician: Referring Clarissia Mckeen: Leotis Pain Treating Lashon Hillier/Extender: [name], HOYT Weeks in Treatment: 0 Visit Information Patient Arrived: Wheel Chair Arrival Time: 10:56 Accompanied By: self Transfer Assistance: Manual Patient Identification Verified: Yes Secondary Verification Process Completed: Yes Patient Has Alerts: Yes Patient Alerts: Type II Diabetic Aspirin 81mg  Electronic Signature(s) Signed: 01/27/2019 4:24:58 PM By: Gretta Cool, BSN, RN, CWS, Kim RN, BSN Entered By: Gretta Cool, BSN, RN, CWS, Kim on 01/27/2019 10:57:30 [name] ([id number]) -------------------------------------------------------------------------------- Clinic Level of Care Assessment Details Patient Name: [name], [name] Date of Service: 01/27/2019 10:15 AM Medical Record Number: [id number] Patient Account Number: [id number] Date of Birth/Sex: December 14, 1960 (58 y.o. M) Treating RN: [name] Primary Care Calani Gick: [name] Other Clinician: Referring Ashtyn Freilich: Leotis Pain Treating Surie Suchocki/Extender: [name], HOYT Weeks in Treatment: 0 Clinic Level of Care Assessment Items TOOL 1 Quantity Score []  - Use when EandM and Procedure is performed on INITIAL visit 0 ASSESSMENTS - Nursing Assessment / Reassessment X - General Physical Exam (combine w/ comprehensive assessment (listed just below) when 1 20 performed on new pt. evals) X- 1 25 Comprehensive Assessment (HX, ROS, Risk Assessments, Wounds Hx, etc.) ASSESSMENTS - Wound and Skin Assessment / Reassessment []  - Dermatologic / Skin Assessment (not related to wound area) 0 ASSESSMENTS - Ostomy and/or Continence Assessment and Care []  - Incontinence Assessment and Management 0 []  - 0 Ostomy Care Assessment and Management (repouching, etc.) PROCESS - Coordination of Care X - Simple Patient / Family Education for ongoing care 1 15 []  - 0 Complex (extensive) Patient / Family Education for ongoing care X- 1 10 Staff obtains Programmer, systems, Records, Test Results / Process Orders []  - 0 Staff telephones HHA, Nursing Homes / Clarify orders / etc []  - 0 Routine Transfer to another Facility (non-emergent condition) []  - 0 Routine Hospital Admission (non-emergent condition) X- 1 15 New Admissions / Biomedical engineer / Ordering NPWT, Apligraf, etc. []  - 0 Emergency Hospital Admission (emergent condition) PROCESS - Special Needs []  - Pediatric / Minor Patient Management 0 []  - 0 Isolation Patient Management []  - 0 Hearing / Language / Visual special needs []  - 0 Assessment of Community assistance (transportation, D/C planning, etc.) []  - 0 Additional assistance / Altered mentation []  - 0 Support Surface(s) Assessment (bed, cushion, seat, etc.) [name], [name] ([id number]) INTERVENTIONS - Miscellaneous []  - External ear exam 0 []  - 0 Patient Transfer (multiple staff / Civil Service fast streamer / Similar devices) []  -  0 Simple Staple / Suture removal (25 or less) []  - 0 Complex Staple / Suture removal (26 or more) []  - 0 Hypo/Hyperglycemic Management (do not check if billed separately) []  - 0 Ankle / Brachial Index (ABI) - do not  check if billed separately Has the patient been seen at the hospital within the last three years: Yes Total Score: 85 Level Of Care: New/Established - Level 3 Electronic Signature(s) Signed: 01/27/2019 12:59:05 PM By: [name] Entered By: [name] on 01/27/2019 11:55:08 [name] ([id number]) -------------------------------------------------------------------------------- Encounter Discharge Information Details Patient Name: [name], [name] Date of Service: 01/27/2019 10:15 AM Medical Record Number: [id number] Patient Account Number: [id number] Date of Birth/Sex: 1961-06-14 (58 y.o. M) Treating RN: Cornell Barman Primary Care Kamisha Ell: [name] Other Clinician: Referring Zyanne Schumm: Leotis Pain Treating Lafern Brinkley/Extender: [name], HOYT Weeks in Treatment: 0 Encounter Discharge Information Items Post Procedure Vitals Discharge Condition: Stable Temperature (F): 97.6 Ambulatory Status: Wheelchair Pulse (bpm): 79 Discharge Destination: Home Respiratory Rate (breaths/min): 16 Transportation: Private Auto Blood Pressure (mmHg): 110/71 Accompanied By: wife and intrepreter Schedule Follow-up Appointment: Yes Clinical Summary of Care: Electronic Signature(s) Signed: 01/27/2019 4:24:58 PM By: Gretta Cool, BSN, RN, CWS, Kim RN, BSN Entered By: Gretta Cool, BSN, RN, CWS, Kim on 01/27/2019 12:31:33 [name] ([id number]) -------------------------------------------------------------------------------- Lower Extremity Assessment Details Patient Name: [name], [name] Date of Service: 01/27/2019 10:15 AM Medical Record Number: [id number] Patient Account Number: [id number] Date of Birth/Sex: 1961-06-16 (58 y.o. M) Treating RN: Cornell Barman Primary Care Dafina Suk: [name] Other Clinician: Referring Vernadette Stutsman: Leotis Pain Treating Cathlyn Tersigni/Extender: Sharalyn Ink in Treatment: 0 Electronic Signature(s) Signed: 01/27/2019 4:24:58 PM By: Gretta Cool, BSN, RN, CWS, Kim RN, BSN Entered By: Gretta Cool, BSN, RN, CWS, Kim on 01/27/2019 11:23:11 [name] ([id number]) -------------------------------------------------------------------------------- Multi Wound Chart Details Patient Name: [name], [name] Date of Service: 01/27/2019 10:15 AM Medical Record Number: [id number] Patient Account Number: [id number] Date of Birth/Sex: [birth year] (58 y.o. M) Treating RN: [name] Primary Care Lachrista Heslin: [name] Other Clinician: Referring Kwane Rohl: Leotis Pain Treating Tavis Kring/Extender: STONE III, HOYT Weeks in Treatment: 0 Vital Signs Height(in): 67 Pulse(bpm): 79 Weight(lbs): 220 Blood Pressure(mmHg): 110/71 Body Mass Index(BMI): 34 Temperature(F): 97.6 Respiratory Rate 16 (breaths/min): Photos: [N/A:N/A] Wound Location: Right Amputation Site - Above N/A N/A Knee Wounding Event: Surgical Injury N/A N/A Primary Etiology: Arterial Insufficiency Ulcer N/A N/A Secondary Etiology: Diabetic Wound/Ulcer of the N/A N/A Lower Extremity Comorbid History: Arrhythmia, Type II Diabetes, N/A N/A End Stage Renal Disease Date Acquired: 09/09/2018 N/A N/A Weeks of Treatment: 0 N/A N/A Wound Status: Open N/A N/A Measurements L x W x D 3x6.5x0.6 N/A N/A (cm) Area (cm) : 15.315 N/A N/A Volume (cm) : 9.189 N/A N/A Classification: Full Thickness Without N/A N/A Exposed Support Structures Exudate Amount: Large N/A N/A Exudate Type: Serosanguineous N/A N/A Exudate Color: red, brown N/A N/A Wound Margin: Thickened N/A N/A Granulation Amount: Small (1-33%) N/A N/A Granulation Quality: Red, Hyper-granulation N/A N/A Necrotic Amount: Large (67-100%) N/A N/A Necrotic Tissue: Eschar, Adherent Slough N/A N/A Exposed Structures: Fat Layer (Subcutaneous N/A  N/A Tissue) Exposed: Yes Fascia: No Tendon: No Hemstreet, Nakul L. ([id number]) Muscle: No Joint: No Bone: No Epithelialization: None N/A N/A Periwound Skin Texture: Excoriation: Yes N/A N/A Induration: No Callus: No Crepitus: No Rash: No Scarring: No Periwound Skin Moisture: Maceration: Yes N/A N/A Dry/Scaly: No Periwound Skin Color: Hemosiderin Staining: Yes N/A N/A Atrophie Blanche: No Cyanosis: No Ecchymosis: No Erythema: No Mottled: No Pallor: No Rubor: No Tenderness on Palpation: No N/A N/A Treatment Notes Electronic Signature(s) Signed: 01/27/2019 12:59:05 PM By: [name] Entered By: [name] on 01/27/2019 Orin, [place]. ([id number]) -------------------------------------------------------------------------------- [place] Details Patient Name: [name], [name] Date of Service: 01/27/2019 10:15 AM Medical  Record Number: [id number] Patient Account Number: [id number] Date of Birth/Sex: [birth year] (57 y.o. M) Treating RN: [name] Primary Care Kiano Terrien: [name] Other Clinician: Referring Kelvin Burpee: Leotis Pain Treating Winfred Redel/Extender: [name], HOYT Weeks in Treatment: 0 Active Inactive Abuse / Safety / Falls / Self Care Management Nursing Diagnoses: Impaired physical mobility Goals: Patient will not develop complications from immobility Date Initiated: 01/27/2019 Target Resolution Date: 04/25/2019 Goal Status: Active Interventions: Assess fall risk on admission and as needed Notes: Orientation to the Wound Care Program Nursing Diagnoses: Knowledge deficit related to the wound healing center program Goals: Patient/caregiver will verbalize understanding of the [place] Program Date Initiated: 01/27/2019 Target Resolution Date: 04/25/2019 Goal Status: Active Interventions: Provide education on orientation to the wound center Notes: Pain, Acute or Chronic Nursing Diagnoses: Pain, acute or  chronic: actual or potential Goals: Patient will verbalize adequate pain control and receive pain control interventions during procedures as needed Date Initiated: 01/27/2019 Target Resolution Date: 04/25/2019 Goal Status: Active Interventions: Complete pain assessment as per visit requirements PREM, COYKENDALL. ([id number]) Notes: Wound/Skin Impairment Nursing Diagnoses: Impaired tissue integrity Goals: Ulcer/skin breakdown will heal within 14 weeks Date Initiated: 01/27/2019 Target Resolution Date: 04/25/2019 Goal Status: Active Interventions: Assess patient/caregiver ability to obtain necessary supplies Assess patient/caregiver ability to perform ulcer/skin care regimen upon admission and as needed Assess ulceration(s) every visit Notes: Electronic Signature(s) Signed: 01/27/2019 12:59:05 PM By: [name] Entered By: [name] on 01/27/2019 11:46:00 [name] ([id number]) -------------------------------------------------------------------------------- Pain Assessment Details Patient Name: [name], [name] Date of Service: 01/27/2019 10:15 AM Medical Record Number: [id number] Patient Account Number: [id number] Date of Birth/Sex: 28-Feb-1961 (58 y.o. M) Treating RN: Cornell Barman Primary Care Lakeyn Dokken: [name] Other Clinician: Referring Nichole Keltner: Leotis Pain Treating Takeem Krotzer/Extender: [name], HOYT Weeks in Treatment: 0 Active Problems Location of Pain Severity and Description of Pain Patient Has Paino Yes Site Locations Pain Location: Pain in Ulcers Duration of the Pain. Constant / Intermittento Constant Character of Pain Describe the Pain: Shooting, Other: stinging, pinching Pain Management and Medication Current Pain Management: Electronic Signature(s) Signed: 01/27/2019 4:24:58 PM By: Gretta Cool, BSN, RN, CWS, Kim RN, BSN Entered By: Gretta Cool, BSN, RN, CWS, Kim on 01/27/2019 10:59:31 [name]  ([id number]) -------------------------------------------------------------------------------- Patient/Caregiver Education Details Patient Name: [name], [name] Date of Service: 01/27/2019 10:15 AM Medical Record Number: [id number] Patient Account Number: [id number] Date of Birth/Gender: 27-Nov-1960 (58 y.o. M) Treating RN: [name] Primary Care Physician: [name] Other Clinician: Referring Physician: Leotis Pain Treating Physician/Extender: [name], HOYT Weeks in Treatment: 0 Education Assessment Education Provided To: Patient and Caregiver Education Topics Provided Wound Debridement: Handouts: Other: purpose of debridement Methods: Explain/Verbal Responses: State content correctly Wound/Skin Impairment: Handouts: Other: wound care as ordered Methods: Demonstration, Explain/Verbal Responses: State content correctly Electronic Signature(s) Signed: 01/27/2019 12:59:05 PM By: [name] Entered By: [name] on 01/27/2019 11:55:50 [name], [name] ([id number]) -------------------------------------------------------------------------------- Wound Assessment Details Patient Name: [name], [name] Date of Service: 01/27/2019 10:15 AM Medical Record Number: [id number] Patient Account Number: [id number] Date of Birth/Sex: [birth year] (58 y.o. 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: Extremity Wounding Event: Surgical Injury Wound Status: Open Date Acquired: 09/09/2018 Comorbid Arrhythmia, Type II Diabetes, End Stage Weeks Of Treatment: 0 History: Renal Disease  Clustered Wound: No Photos Wound Measurements Length: (cm) 3 Width: (cm) 6.5 Depth:  (cm) 0.6 Area: (cm) 15.315 Volume: (cm) 9.189 % Reduction in Area: % Reduction in Volume: Epithelialization: None Tunneling: No Wound Description Full Thickness Without Exposed Support Classification: Structures Wound Margin: Thickened Exudate Large Amount: Exudate Type: Serosanguineous Exudate Color: red, brown Foul Odor After Cleansing: No Slough/Fibrino Yes Wound Bed Granulation Amount: Small (1-33%) Exposed Structure Granulation Quality: Red, Hyper-granulation Fascia Exposed: No Necrotic Amount: Large (67-100%) Fat Layer (Subcutaneous Tissue) Exposed: Yes Necrotic Quality: Eschar, Adherent Slough Tendon Exposed: No Muscle Exposed: No Joint Exposed: No Bone Exposed: No Periwound Skin Texture Gasiorowski, Martell L. ([id number]) Texture Color No Abnormalities Noted: No No Abnormalities Noted: No Callus: No Atrophie Blanche: No Crepitus: No Cyanosis: No Excoriation: Yes Ecchymosis: No Induration: No Erythema: No Rash: No Hemosiderin Staining: Yes Scarring: No Mottled: No Pallor: No Moisture Rubor: No No Abnormalities Noted: No Dry / Scaly: No Maceration: Yes Treatment Notes Wound #1 (Right Amputation Site - Above Knee) Notes Santyl, saline moistened gauze, ABD secured with tape and stretch net Electronic Signature(s) Signed: 01/27/2019 4:24:58 PM By: Gretta Cool, BSN, RN, CWS, Kim RN, BSN Entered By: Gretta Cool, BSN, RN, CWS, Kim on 01/27/2019 11:22:29 [name] ([id number]) -------------------------------------------------------------------------------- Vitals Details Patient Name: [name], Raef L. Date of Service: 01/27/2019 10:15 AM Medical Record Number: [id number] Patient Account Number: [id number] Date of Birth/Sex: May 08, 1961 (58 y.o. M) Treating RN: Cornell Barman Primary Care Lareen Mullings: [name] Other Clinician: Referring Leveta Wahab: Leotis Pain Treating Rainen Vanrossum/Extender: [name], HOYT Weeks in Treatment: 0 Vital Signs Time Taken: 10:57 Temperature  (F): 97.6 Height (in): 67 Pulse (bpm): 79 Weight (lbs): 220 Respiratory Rate (breaths/min): 16 Body Mass Index (BMI): 34.5 Blood Pressure (mmHg): 110/71 Reference Range: 80 - 120 mg / dl Electronic Signature(s) Signed: 01/27/2019 4:24:58 PM By: Gretta Cool, BSN, RN, CWS, Kim RN, BSN Entered By: Gretta Cool, BSN, RN, CWS, Kim on 01/27/2019 10:59:00

## 2019-01-27 NOTE — Progress Notes (Signed)
SEGUNDO, MAKELA ([id number]) Visit Report for 01/27/2019 Chief Complaint Document Details Patient Name: [name], [name] Date of Service: 01/27/2019 10:15 AM Medical Record Number: [id number] Patient Account Number: [id number] Date of Birth/Sex: 1960-11-07 (58 y.o. M) Treating RN: [name] Primary Care Provider: [name] Other Clinician: Referring Provider: Leotis Pain Treating Provider/Extender: [name], HOYT Weeks in Treatment: 0 Information Obtained from: Patient Chief Complaint Right AKA Ulcer at surgical incision Electronic Signature(s) Signed: 01/27/2019 1:21:29 PM By: Worthy Keeler PA-C Entered By: Worthy Keeler on 01/27/2019 11:34:30 [name] ([id number]) -------------------------------------------------------------------------------- Debridement Details Patient Name: [name], [name] Date of Service: 01/27/2019 10:15 AM Medical Record Number: [id number] Patient Account Number: [id number] Date of Birth/Sex: 10-25-1960 (58 y.o. M) Treating RN: [name] Primary Care Provider: [name] Other Clinician: Referring Provider: Leotis Pain Treating Provider/Extender: [name], HOYT Weeks in Treatment: 0 Debridement Performed for Wound #1 Right Amputation Site - Above Knee Assessment: Performed By: Physician STONE III, HOYT E., PA-C Debridement Type: Debridement Severity of Tissue Pre Fat layer exposed Debridement: Level of Consciousness (Pre- Awake and Alert procedure): Pre-procedure Verification/Time Yes - 11:49 Out Taken: Start Time: 11:49 Pain Control: Lidocaine 4% Topical Solution Total Area Debrided (L x W): 3 (cm) x 6.5 (cm) = 19.5 (cm) Tissue and other material Viable, Non-Viable, Eschar, Slough, Subcutaneous, Slough debrided: Level: Skin/Subcutaneous Tissue Debridement Description: Excisional Instrument: Blade, Curette, Forceps Bleeding: Minimum Hemostasis Achieved: Pressure End Time: 12:04 Procedural Pain: 0 Post Procedural  Pain: 0 Response to Treatment: Procedure was tolerated well Level of Consciousness Awake and Alert (Post-procedure): Post Debridement Measurements of Total Wound Length: (cm) 3 Width: (cm) 6.5 Depth: (cm) 0.7 Volume: (cm) 10.721 Character of Wound/Ulcer Post Debridement: Improved Severity of Tissue Post Debridement: Fat layer exposed Post Procedure Diagnosis Same as Pre-procedure Electronic Signature(s) Signed: 01/27/2019 12:59:05 PM By: [name] Signed: 01/27/2019 1:21:29 PM By: Worthy Keeler PA-C Entered By: [name] on 01/27/2019 12:06:21 [name] ([id number]) -------------------------------------------------------------------------------- HPI Details Patient Name: [name], [name] Date of Service: 01/27/2019 10:15 AM Medical Record Number: [id number] Patient Account Number: [id number] Date of Birth/Sex: 01-May-1961 (58 y.o. M) Treating RN: [name] Primary Care Provider: [name] Other Clinician: Referring Provider: Leotis Pain Treating Provider/Extender: [name], HOYT Weeks in Treatment: 0 History of Present Illness HPI Description: 01/27/19 patient presents today for initial evaluation here in our clinic concerning an issue he's been having with a wound at the right above knee amputation site which began shortly following the surgery in November 2019. 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 He has a history of hypertension, diabetes, congestive heart failure, and states that he is having a lot of discomfort at the wound site unfortunately. There does not appear to be any signs of active infection upon initial inspection today. No fevers, chills, nausea, or vomiting noted at this time.. Silvadene Cream has been utilized at some point on this wound as well.  Patient did have home health coming out but unfortunately the patient and his wife state that they were not happy with the services being rendered and therefore discontinued the service. There does not appear to be any signs of otherwise active infection at this time which is good news. The patient states he's definitely ready for this to heal and has been a symptomatic  time since his surgery and he is becoming somewhat frustrated. Iodoflex also sounds to have previously been used on the wound that is when the patient's wife states the wound seem to get worse not better. As best I can tell [name] has not been utilized. Electronic Signature(s) Signed: 01/27/2019 1:21:29 PM By: Worthy Keeler PA-C Entered By: Worthy Keeler on 01/27/2019 13:18:38 [name] ([id number]) -------------------------------------------------------------------------------- Physical Exam Details Patient Name: [name], [name] Date of Service: 01/27/2019 10:15 AM Medical Record Number: [id number] Patient Account Number: [id number] Date of Birth/Sex: Jan 01, 1961 (58 y.o. M) Treating RN: [name] Primary Care Provider: [name] Other Clinician: Referring Provider: Leotis Pain Treating Provider/Extender: STONE III, HOYT Weeks in Treatment: 0 Constitutional sitting or standing blood pressure is within target range for patient.. pulse regular and within target range for patient.Marland Kitchen respirations regular, non-labored and within target range for patient.Marland Kitchen temperature within target range for patient.. Well- nourished and well-hydrated in no acute distress. Eyes conjunctiva clear no eyelid edema noted. pupils equal round and reactive to light and accommodation. Ears, Nose, Mouth, and Throat no gross abnormality of ear auricles or external auditory canals. normal hearing noted during conversation. mucus membranes moist. Respiratory normal breathing without difficulty. clear to auscultation  bilaterally. Cardiovascular regular rate and rhythm with normal S1, S2. Left BKA and right AKA. Gastrointestinal (GI) soft, non-tender, non-distended, +BS. no ventral hernia noted. Musculoskeletal Patient unable to walk without assistance due to amputations. Psychiatric this patient is able to make decisions and demonstrates good insight into disease process. Alert and Oriented x 3. pleasant and cooperative. Notes Upon evaluation today patient's wound bed did have some Slough as well as eschar especially around the edge of the wound noted at this point. Subsequently this did require sharp debridement I discussed this with the patient as well he did want me to proceed with sharp debridement as such. I was able to remove a significant portion of the eschar and Slough from the surface and perimeter of the wound three not the edges which should allow for more appropriate tissue growth going forward. Especially with the use of cental I think that this will likely do quite well. The patient tolerated the debridement today without complication which was good news. He did have some pain but was able to tolerate. Electronic Signature(s) Signed: 01/27/2019 1:21:29 PM By: Worthy Keeler PA-C Entered By: Worthy Keeler on 01/27/2019 13:20:01 [name] ([id number]) -------------------------------------------------------------------------------- Physician Orders Details Patient Name: Kristopher Haggard L. Date of Service: 01/27/2019 10:15 AM Medical Record Number: [id number] Patient Account Number: [id number] Date of Birth/Sex: [birth year] (58 y.o. M) Treating RN: [name] Primary Care Provider: [name] Other Clinician: Referring Provider: Leotis Pain Treating Provider/Extender: [name], HOYT Weeks in Treatment: 0 Verbal / Phone Orders: No Diagnosis Coding ICD-10 Coding Code Description I73.89 Other specified peripheral vascular diseases L97.812 Non-pressure chronic ulcer of other  part of right lower leg with fat layer exposed Z89.611 Acquired absence of right leg above knee E11.622 Type 2 diabetes mellitus with other skin ulcer I50.42 Chronic combined systolic (congestive) and diastolic (congestive) heart failure N18.6 End stage renal disease Z99.2 Dependence on renal dialysis I10 Essential (primary) hypertension I48.0 Paroxysmal atrial fibrillation Wound Cleansing Wound #1 Right Amputation Site - Above Knee o Clean wound with Normal Saline. Anesthetic (add to Medication List) Wound #1 Right Amputation Site - Above Knee o Topical Lidocaine 4% cream applied to wound bed prior to debridement (In Clinic Only). Primary Wound Dressing Wound #1 Right  Amputation Site - Above Knee o Santyl Ointment Secondary Dressing Wound #1 Right Amputation Site - Above Knee o ABD pad - secure with tape o Saline moistened gauze Dressing Change Frequency Wound #1 Right Amputation Site - Above Knee o Change dressing every day. Follow-up Appointments Wound #1 Right Amputation Site - Above Knee o Return Appointment in 1 week. Medications-please add to medication list. Wound #1 Right Amputation Site - Above Knee Hinds, Kimm L. ([id number]) o Santyl Enzymatic Ointment Patient Medications Allergies: No Known Drug Allergies Notifications Medication Indication Start End Santyl 01/27/2019 DOSE topical 250 unit/gram ointment - ointment topical applied nickel thick to the wound bed and then cover with a dressing as directed Electronic Signature(s) Signed: 01/27/2019 12:37:38 PM By: Worthy Keeler PA-C Entered By: Worthy Keeler on 01/27/2019 12:37:38 [name] ([id number]) -------------------------------------------------------------------------------- Problem List Details Patient Name: [name], Benino L. Date of Service: 01/27/2019 10:15 AM Medical Record Number: [id number] Patient Account Number: [id number] Date of Birth/Sex: [birth year] (58 y.o. M) Treating RN:  [name] Primary Care Provider: [name] Other Clinician: Referring Provider: Leotis Pain Treating Provider/Extender: [name], HOYT Weeks in Treatment: 0 Active Problems ICD-10 Evaluated Encounter Code Description Active Date Today Diagnosis I73.89 Other specified peripheral vascular diseases 01/27/2019 No Yes L97.812 Non-pressure chronic ulcer of other part of right lower leg 01/27/2019 No Yes with fat layer exposed Z89.611 Acquired absence of right leg above knee 01/27/2019 No Yes E11.622 Type 2 diabetes mellitus with other skin ulcer 01/27/2019 No Yes I50.42 Chronic combined systolic (congestive) and diastolic 1/54/0086 No Yes (congestive) heart failure N18.6 End stage renal disease 01/27/2019 No Yes Z99.2 Dependence on renal dialysis 01/27/2019 No Yes I10 Essential (primary) hypertension 01/27/2019 No Yes I48.0 Paroxysmal atrial fibrillation 01/27/2019 No Yes Inactive Problems Resolved Problems HATIM, HOMANN ([id number]) Electronic Signature(s) Signed: 01/27/2019 1:21:29 PM By: Worthy Keeler PA-C Entered By: Worthy Keeler on 01/27/2019 11:33:40 [name] ([id number]) -------------------------------------------------------------------------------- Progress Note Details Patient Name: [name], [name] Date of Service: 01/27/2019 10:15 AM Medical Record Number: [id number] Patient Account Number: [id number] Date of Birth/Sex: 1960-11-22 (58 y.o. M) Treating RN: [name] Primary Care Provider: [name] Other Clinician: Referring Provider: Leotis Pain Treating Provider/Extender: [name], HOYT Weeks in Treatment: 0 Subjective Chief Complaint Information obtained from Patient Right AKA Ulcer at surgical incision History of Present Illness (HPI) 01/27/19 patient presents today for initial evaluation here in our clinic concerning an issue he's been having with a wound at the right above knee amputation site which began shortly following the surgery in  November 2019. 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 He has a history of hypertension, diabetes, congestive heart failure, and states that he is having a lot of discomfort at the wound site unfortunately. There does not appear to be any signs of active infection upon initial inspection today. No fevers, chills, nausea, or vomiting noted at this time.. Silvadene Cream has been utilized at some point on this wound as well. Patient did have home health coming out but unfortunately the patient and his wife state that they were not happy with the services being rendered and therefore discontinued the service. There does not appear to be any signs of otherwise active infection at this time which is good news. The patient states he's definitely ready for  this to heal and has been a symptomatic time since his surgery and he is becoming somewhat frustrated. Iodoflex also sounds to have previously been used on the wound that is when the patient's wife states the wound seem to get worse not better. As best I can tell [name] has not been utilized. Patient History Information obtained from Patient. Allergies No Known Drug Allergies Family History Cancer - Father, Diabetes - Siblings, No family history of Hereditary Spherocytosis. Social History Former smoker - ended on 10/16/1994, Marital Status - Married, Alcohol Use - Never, Drug Use - No History, Caffeine Use - Rarely. Medical History Cardiovascular Patient has history of Arrhythmia - A-fib, Congestive Heart Failure, Hypertension, Peripheral Arterial Disease - Bilateral LE amputations Endocrine Patient has history of Type II Diabetes Genitourinary Patient has history of End Stage Renal Disease - Dialysis M,W,F Patient is treated with Oral Agents. Blood  sugar is not tested. Blood sugar results noted at the following times: Breakfast - 128, Lunch - 145. [name], [name] ([id number]) Hospitalization/Surgery History - Right AKA. - Left BKA. 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 Eyes Denies complaints or symptoms of Dry Eyes, Vision Changes, Glasses / Contacts. Ear/Nose/Mouth/Throat Denies complaints or symptoms of Difficult clearing ears, Sinusitis. Hematologic/Lymphatic Denies complaints or symptoms of Bleeding / Clotting Disorders, Human Immunodeficiency Virus. Respiratory Denies complaints or symptoms of Chronic or frequent coughs, Shortness of Breath. Cardiovascular Denies complaints or symptoms of Chest pain, LE edema. Gastrointestinal Denies complaints or symptoms of Frequent diarrhea, Nausea, Vomiting. Genitourinary Complains or has symptoms of Kidney failure/ Dialysis. Immunological Denies complaints or symptoms of Hives, Itching. Integumentary (Skin) Complains or has symptoms of Wounds, Bleeding or bruising tendency. Musculoskeletal Denies complaints or symptoms of Muscle Pain, Muscle Weakness. Neurologic Denies complaints or symptoms of Numbness/parasthesias, Focal/Weakness. Psychiatric Denies complaints or symptoms of Anxiety, Claustrophobia. Objective Constitutional sitting or standing blood pressure is within target range for patient.. pulse regular and within target range for patient.Marland Kitchen respirations regular, non-labored and within target range for patient.Marland Kitchen temperature within target range for patient.. Well- nourished and well-hydrated in no acute distress. Vitals Time Taken: 10:57 AM, Height: 67 in, Weight: 220 lbs, BMI: 34.5, Temperature: 97.6 F, Pulse: 79 bpm,  Respiratory Rate: 16 breaths/min, Blood Pressure: 110/71 mmHg. Eyes conjunctiva clear no eyelid edema noted. pupils equal round and reactive to light and accommodation. Ears, Nose, Mouth, and Throat no gross abnormality of ear auricles or external auditory canals. normal hearing noted during conversation. mucus membranes moist. Shippee, [name] ([id number]) Respiratory normal breathing without difficulty. clear to auscultation bilaterally. Cardiovascular regular rate and rhythm with normal S1, S2. Left BKA and right AKA. Gastrointestinal (GI) soft, non-tender, non-distended, +BS. no ventral hernia noted. Musculoskeletal Patient unable to walk without assistance due to amputations. Psychiatric this patient is able to make decisions and demonstrates good insight into disease process. Alert and Oriented x 3. pleasant and cooperative. General Notes: Upon evaluation today patient's wound bed did have some Slough as well as eschar especially around the edge of the wound noted at this point. Subsequently this did require sharp debridement I discussed this with the patient as well he did want me to proceed with sharp debridement as such. I was able to remove a significant portion of the eschar and Slough from the surface and perimeter of  the wound three not the edges which should allow for more appropriate tissue growth going forward. Especially with the use of cental I think that this will likely do quite well. The patient tolerated the debridement today without complication which was good news. He did have some pain but was able to tolerate. Integumentary (Hair, Skin) Wound #1 status is Open. Original cause of wound was Surgical Injury. The wound is located on the Right Amputation Site - Above Knee. The wound measures 3cm length x 6.5cm width x 0.6cm depth; 15.315cm^2 area and 9.189cm^3 volume. There is Fat Layer (Subcutaneous Tissue) Exposed exposed. There is no tunneling noted. There is a large  amount of serosanguineous drainage noted. The wound margin is thickened. There is small (1-33%) red, hyper - granulation within the wound bed. There is a large (67-100%) amount of necrotic tissue within the wound bed including Eschar and Adherent Slough. The periwound skin appearance exhibited: Excoriation, Maceration, Hemosiderin Staining. The periwound skin appearance did not exhibit: Callus, Crepitus, Induration, Rash, Scarring, Dry/Scaly, Atrophie Blanche, Cyanosis, Ecchymosis, Mottled, Pallor, Rubor, Erythema. 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 ([id number]) Procedures Wound #1 Pre-procedure diagnosis of Wound #1 is an Arterial Insufficiency Ulcer located on the Right Amputation Site - Above Knee .Severity of Tissue Pre Debridement is: Fat layer exposed. There was a Excisional Skin/Subcutaneous Tissue Debridement with a total area of 19.5 sq cm performed by STONE III, HOYT E., PA-C. With the following instrument(s): Blade, Curette, and Forceps to remove Viable and Non-Viable tissue/material. Material removed includes Eschar, Subcutaneous Tissue, and Slough after achieving pain control using Lidocaine 4% Topical Solution. No specimens were taken. A time out was conducted at 11:49, prior to the start of the procedure. A Minimum amount of bleeding was controlled with Pressure. The procedure was tolerated well with a pain level of 0 throughout and a pain level of 0 following the procedure. Post Debridement Measurements: 3cm length x 6.5cm width x 0.7cm depth; 10.721cm^3  volume. Character of Wound/Ulcer Post Debridement is improved. Severity of Tissue Post Debridement is: Fat layer exposed. Post procedure Diagnosis Wound #1: Same as Pre-Procedure Plan Wound Cleansing: Wound #1 Right Amputation Site - Above Knee: Clean wound with Normal Saline. Anesthetic (add to Medication List): Wound #1 Right Amputation Site - Above Knee: Topical Lidocaine 4% cream applied to wound bed prior to debridement (In Clinic Only). Primary Wound Dressing: Wound #1 Right Amputation Site - Above Knee: Santyl Ointment Secondary Dressing: Wound #1 Right Amputation Site - Above Knee: ABD pad - secure with tape Saline moistened gauze Dressing Change Frequency: Wound #1 Right Amputation Site - Above Knee: Change dressing every day. Follow-up Appointments: Wound #1 Right Amputation Site - Above Knee: Return Appointment in 1 week. Medications-please add to medication list.: Wound #1 Right Amputation Site - Above Knee: Santyl Enzymatic Ointment The following medication(s) was prescribed: Santyl topical 250 unit/gram ointment ointment topical applied nickel thick to the wound bed and then cover with a dressing as directed starting 01/27/2019 At this point I'm gonna initiate treatment with cental over the next week and I will see him for reevaluation at that time. I sent just a two week supply the  Santyl start with we will see how things do and then subsequently make any adjustments as necessary in the future. If anything changes or worsens in the meantime he will contact the office and let me know. The patient's wife was present for the entire evaluation today and I did have the help of the interpreter as well which was greatly beneficial. [name], [name] ([id number]) Please see above for specific wound care orders. We will see patient for re-evaluation in 1 week(s) here in the clinic. If anything worsens or changes patient will contact our office for additional  recommendations. Electronic Signature(s) Signed: 01/27/2019 1:21:29 PM By: Worthy Keeler PA-C Entered By: Worthy Keeler on 01/27/2019 13:20:47 [name] ([id number]) -------------------------------------------------------------------------------- ROS/PFSH Details Patient Name: [name], [name] Date of Service: 01/27/2019 10:15 AM Medical Record Number: [id number] Patient Account Number: [id number] Date of Birth/Sex: Feb 05, 1961 (58 y.o. M) Treating RN: Cornell Barman Primary Care Provider: [name] Other Clinician: Referring Provider: Leotis Pain Treating Provider/Extender: [name], HOYT Weeks in Treatment: 0 Information Obtained From Patient Constitutional Symptoms (General Health) Complaints and Symptoms: Negative for: Fatigue; Fever; Chills; Marked Weight Change Medical History: Past Medical History Notes: Right AKA (September 09, 2018) BLOOD FLOW) ; Left BKA (2015) Gangrene Stroke Eyes Complaints and Symptoms: Negative for: Dry Eyes; Vision Changes; Glasses / Contacts Ear/Nose/Mouth/Throat Complaints and Symptoms: Negative for: Difficult clearing ears; Sinusitis Hematologic/Lymphatic Complaints and Symptoms: Negative for: Bleeding / Clotting Disorders; Human Immunodeficiency Virus Respiratory Complaints and Symptoms: Negative for: Chronic or frequent coughs; Shortness of Breath Cardiovascular Complaints and Symptoms: Negative for: Chest pain; LE edema Medical History: Positive for: Arrhythmia - A-fib; Congestive Heart Failure; Hypertension; Peripheral Arterial Disease - Bilateral LE amputations Gastrointestinal Complaints and Symptoms: Negative for: Frequent diarrhea; Nausea; Vomiting Genitourinary [name], [name] ([id number]) Complaints and Symptoms: Positive for: Kidney failure/ Dialysis Medical History: Positive for: End Stage Renal Disease - Dialysis M,W,F Immunological Complaints and Symptoms: Negative for: Hives; Itching Integumentary  (Skin) Complaints and Symptoms: Positive for: Wounds; Bleeding or bruising tendency Musculoskeletal Complaints and Symptoms: Negative for: Muscle Pain; Muscle Weakness Neurologic Complaints and Symptoms: Negative for: Numbness/parasthesias; Focal/Weakness Psychiatric Complaints and Symptoms: Negative for: Anxiety; Claustrophobia Endocrine Medical History: Positive for: Type II Diabetes Time with diabetes: 14 years Treated with: Oral agents Blood sugar tested every day: No Blood sugar testing results: Breakfast: 128; Lunch: 145 Immunizations Pneumococcal Vaccine: Received Pneumococcal Vaccination: Yes Implantable Devices None Hospitalization / Surgery History Type of Hospitalization/Surgery Right AKA Left BKA Family and Social History Cancer: Yes - Father; Diabetes: Yes - Siblings; Hereditary Spherocytosis: No; Former smoker - ended on 10/16/1994; Marital Status - Married; Alcohol Use: Never; Drug Use: No History; Caffeine Use: Rarely [name], [name] ([id number]) Electronic Signature(s) Signed: 01/27/2019 1:21:29 PM By: Worthy Keeler PA-C Signed: 01/27/2019 4:24:58 PM By: Gretta Cool, BSN, RN, CWS, Kim RN, BSN Entered By: Gretta Cool, BSN, RN, CWS, Kim on 01/27/2019 11:44:08 [name] ([id number]) -------------------------------------------------------------------------------- [place] Details Patient Name: [name], [name] Date of Service: 01/27/2019 Medical Record Number: [id number] Patient Account Number: [id number] Date of Birth/Sex: 18-Jan-1961 (58 y.o. 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 N18.6 End stage renal disease Z99.2 Dependence on renal dialysis I10 Essential (primary) hypertension I48.0 Paroxysmal atrial fibrillation Facility Procedures CPT4 Code Description: 51834373 99213 - WOUND CARE VISIT-LEV 3 EST PT Modifier: Quantity: 1 CPT4 Code Description: [id number] 11042 - DEB SUBQ TISSUE 20 SQ CM/< ICD-10 Diagnosis Description Q41.282 Non-pressure chronic ulcer of other part of right lower leg wit Modifier: h fat layer expo Quantity: 1 sed Physician Procedures CPT4 Code Description: [id number] WC PHYS LEVEL 3 o NEW PT ICD-10 Diagnosis Description I73.89 Other specified peripheral vascular diseases L97.812 Non-pressure chronic ulcer of other part of right lower leg wi 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

## 2019-01-27 NOTE — Progress Notes (Signed)
[name], [name] ([id number]) Visit Report for 01/27/2019 Abuse/Suicide Risk Screen Details Patient Name: [name], [name] Date of Service: 01/27/2019 10:15 AM Medical Record Number: [id number] Patient Account Number: [id number] Date of Birth/Sex: 1961-03-30 (58 y.o. M) Treating RN: Cornell Barman Primary Care Kizzi Overbey: [name] Other Clinician: Referring Anju Sereno: Leotis Pain Treating Thomas Rhude/Extender: [name], HOYT Weeks in Treatment: 0 Abuse/Suicide Risk Screen Items Answer ABUSE/SUICIDE RISK SCREEN: Has anyone close to you tried to hurt or harm you recentlyo No Do you feel uncomfortable with anyone in your familyo No Has anyone forced you do things that you didnot want to doo No Do you have any thoughts of harming yourselfo No Patient displays signs or symptoms of abuse and/or neglect. No Electronic Signature(s) Signed: 01/27/2019 4:24:58 PM By: Gretta Cool, BSN, RN, CWS, Kim RN, BSN Entered By: Gretta Cool, BSN, RN, CWS, Kim on 01/27/2019 11:18:06 [name] ([id number]) -------------------------------------------------------------------------------- Activities of Daily Living Details Patient Name: [name], [name] Date of Service: 01/27/2019 10:15 AM Medical Record Number: [id number] Patient Account Number: [id number] Date of Birth/Sex: 09-12-1961 (58 y.o. M) Treating RN: Cornell Barman Primary Care Tacha Manni: [name] Other Clinician: Referring Dante Cooter: Leotis Pain Treating Sharnelle Cappelli/Extender: [name], HOYT Weeks in Treatment: 0 Activities of Daily Living Items Answer Activities of Daily Living (Please select one for each item) Drive Automobile Not Able Take Medications Completely Able Use Telephone Completely Able Care for Appearance Completely Able Use Toilet Completely Able Bath / Shower Completely Able Dress Self Completely Able Feed Self Completely Able Walk Not Able Get In / Out Bed Completely Able Housework Completely Able Prepare Meals Completely [place] for Self Need Assistance Electronic Signature(s) Signed: 01/27/2019 4:24:58 PM By: Gretta Cool, BSN, RN, CWS, Kim RN, BSN Entered By: Gretta Cool, BSN, RN, CWS, Kim on 01/27/2019 11:18:39 [name] ([id number]) -------------------------------------------------------------------------------- Education Screening Details Patient Name: [name], [name] Date of Service: 01/27/2019 10:15 AM Medical Record Number: [id number] Patient Account Number: [id number] Date of Birth/Sex: October 15, 1961 (58 y.o. 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, occupational Needed: Yes Hospital Employed Language Interpreter Memory Deficit: No Emotional Barrier: No Cultural/Religious Beliefs Affecting Medical Care: No Physical Barrier Impaired Vision: No Impaired Hearing: No Decreased Hand dexterity: No Knowledge/Comprehension Knowledge Level: High Comprehension Level: High Ability to understand written High instructions: Ability to understand verbal High instructions: Motivation Anxiety Level: Calm Cooperation: Cooperative Education Importance: Acknowledges Need Interest in Health Problems: Asks Questions Perception: Coherent Willingness to Engage in Self- High Management Activities: Readiness to Engage in Self- High Management Activities: Electronic Signature(s) Signed: 01/27/2019 4:24:58 PM By: Gretta Cool, BSN, RN, CWS, Kim RN, BSN Entered By: Gretta Cool, BSN, RN, CWS, Kim on 01/27/2019 11:19:20 [name] ([id number]) -------------------------------------------------------------------------------- Fall  Risk Assessment Details Patient Name: [name], [name] Date of Service: 01/27/2019 10:15 AM Medical Record Number: [id number] Patient Account Number: [id number] Date of Birth/Sex: Oct 11, 1961 (58 y.o. M) Treating RN: Cornell Barman Primary Care Jaydis Duchene: [name] Other Clinician: Referring Julie-Anne Torain: Leotis Pain Treating Isacc Turney/Extender: [name], HOYT Weeks in Treatment: 0 Fall Risk Assessment Items Have you had 2 or more falls in the last 12 monthso 0 No Have you had any fall that resulted in injury in the last 12 monthso 0 No FALL RISK ASSESSMENT: History  of falling - immediate or within 3 months 25 Yes Secondary diagnosis 0 No Ambulatory aid None/bed rest/wheelchair/nurse 0 Yes Crutches/cane/walker 0 No Furniture 0 No IV Access/Saline Lock 0 No Gait/Training Normal/bed rest/immobile 0 Yes Weak 0 No Impaired 0 No Mental Status Oriented to own ability 0 Yes Electronic Signature(s) Signed: 01/27/2019 4:24:58 PM By: Gretta Cool, BSN, RN, CWS, Kim RN, BSN Entered By: Gretta Cool, BSN, RN, CWS, Kim on 01/27/2019 11:19:37 [name] ([id number]) -------------------------------------------------------------------------------- Foot Assessment Details Patient Name: [name], [name] Date of Service: 01/27/2019 10:15 AM Medical Record Number: [id number] Patient Account Number: [id number] Date of Birth/Sex: 1961-01-18 (58 y.o. 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 [x]  Unable to perform left foot assessment due to amputation Site Locations + = Sensation present, - = Sensation absent, C = Callus, U = Ulcer R = Redness, W = Warmth, M = Maceration, PU = Pre-ulcerative lesion F = Fissure, S = Swelling, D = Dryness Assessment Right: Left: Other Deformity: Prior Foot  Ulcer: Prior Amputation: Charcot Joint: Ambulatory Status: Non-ambulatory Assistance Device: Wheelchair Gait: Engineer, maintenance) Signed: 01/27/2019 4:24:58 PM By: Gretta Cool, BSN, RN, CWS, Kim RN, BSN Entered By: Gretta Cool, BSN, RN, CWS, Kim on 01/27/2019 11:20:11 [name] ([id number]) -------------------------------------------------------------------------------- Nutrition Risk Screening Details Patient Name: [name], [name] Date of Service: 01/27/2019 10:15 AM Medical Record Number: [id number] Patient Account Number: [id number] Date of Birth/Sex: Aug 18, 1961 (58 y.o. M) Treating RN: Cornell Barman Primary Care Jaelle Campanile: [name] Other Clinician: Referring Kirah Stice: Leotis Pain Treating [place] Sloop/Extender: [name], HOYT Weeks in Treatment: 0 Height (in): 67 Weight (lbs): 220 Body Mass Index (BMI): 34.5 Nutrition Risk Screening Items Score Screening NUTRITION RISK SCREEN: I have an illness or condition that made me change the kind and/or amount of 0 No food I eat I eat fewer than two meals per day 0 No I eat few fruits and vegetables, or milk products 0 No I have three or more drinks of beer, liquor or wine almost every day 0 No I have tooth or mouth problems that make it hard for me to eat 0 No I don't always have enough money to buy the food I need 0 No I eat alone most of the time 0 No I take three or more different prescribed or over-the-counter drugs a day 0 No Without wanting to, I have lost or gained 10 pounds in the last six months 0 No I am not always physically able to shop, cook and/or feed myself 0 No Nutrition Protocols Good Risk Protocol Provide education on Moderate Risk Protocol 0 nutrition High Risk Proctocol Risk Level: Good Risk Score: 0 Electronic Signature(s) Signed: 01/27/2019 4:24:58 PM By: Gretta Cool, BSN, RN, CWS, Kim RN, BSN Entered By: Gretta Cool, BSN, RN, CWS, Kim on 01/27/2019 11:19:58

## 2019-01-29 ENCOUNTER — Encounter (INDEPENDENT_AMBULATORY_CARE_PROVIDER_SITE_OTHER): Payer: Self-pay | Admitting: Nurse Practitioner

## 2019-01-29 NOTE — Progress Notes (Signed)
SUBJECTIVE:  Patient ID: [name], male    DOB: 04-Nov-1960, 58 y.o.   MRN: [id number] Chief Complaint  Patient presents with  . Follow-up    3week follow up    HPI  [name] is a 58 y.o. male presents today for wound follow-up of his right above-knee amputation.  The patient has had a long complicated course with healing his right above-knee amputation.  He recently underwent debridement of the wound with wound VAC placement.  However, the patient did not wish to have home health and prefer for his wife to change the wound VAC.  His wife is not a Buyer, retail.  The patient also decided to stop wound VAC therapy without medical advice.  The patient was instructed to obtain Aquasol AG in order to place in the wound, however they were unable to obtain Aquacel Ag therefore they have been doing wet-to-dry dressings.  The wound shows little evidence of healing.  He denies any fevers, chills, nausea, vomiting or diarrhea.  He denies any chest pain or shortness of breath.  Past Medical History:  Diagnosis Date  . (HFpEF) heart failure with preserved ejection fraction ([place])    a. 2017 EF per LHC 30-35%; b. 09/2018 Echo: EF 55-60%, no rwma, Gr2 DD, mild MR, mildly dil LA. PASP 25mmHg. small to mod circumferential pericardial eff, no tamponade.  . Anemia of chronic disease   . Atherosclerosis   . CAD (coronary artery disease)    a. 2017 LHC, severe 1v disease, occluded pRCA, L-R collaterals, moderate LV dysfunction EF 30-35.  . Diabetes mellitus with complication ([place])   . End stage renal disease on dialysis ([place])    Labile BP  . History of ventricular tachycardia    a. 2017 polymorphic VT and cardiac arrest.  . Hypercholesteremia   . Hyperlipidemia LDL goal <70   . Hypertension   . Labile blood pressure    on HD  . NSVT (nonsustained ventricular tachycardia) ([place])    08/2018 NSVT  . PAF (paroxysmal atrial fibrillation) ([place])    a. 08/2018 CHA2DS2VASc at least  6-->initially placed on eliquis but d/c'd 2/2 anemia. On Amio.  Marland Kitchen Peripheral arterial occlusive disease (HCC)    s/p L BKA, R AKA  . PONV (postoperative nausea and vomiting)   . S/P AKA (above knee amputation) unilateral, right ([place])    08/2018  . S/P BKA (below knee amputation) unilateral, left ([place])    2015  . Stroke [facility])     Past Surgical History:  Procedure Laterality Date  . A/V FISTULAGRAM Left 01/09/2017   Procedure: A/V Fistulagram;  Surgeon: [name], MD;  Location: [place] [facility];  Service: Cardiovascular;  Laterality: Left;  . A/V FISTULAGRAM Left 06/06/2017   Procedure: A/V Fistulagram;  Surgeon: [name], MD;  Location: [place] [facility];  Service: Cardiovascular;  Laterality: Left;  . A/V FISTULAGRAM Left 08/15/2017   Procedure: A/V Fistulagram;  Surgeon: [name], MD;  Location: [place] [facility];  Service: Cardiovascular;  Laterality: Left;  . A/V FISTULAGRAM Left 01/22/2018   Procedure: A/V FISTULAGRAM;  Surgeon: [name], MD;  Location: [place] [facility];  Service: Cardiovascular;  Laterality: Left;  . A/V SHUNT INTERVENTION N/A 06/06/2017   Procedure: A/V SHUNT INTERVENTION;  Surgeon: [name], MD;  Location: [place] [facility];  Service: Cardiovascular;  Laterality: N/A;  . AMPUTATION Right 04/25/2016   Procedure: TOE AMPUTATION;  Surgeon: Katha Cabal,  MD;  Location: [facility];  Service: Vascular;  Laterality: Right;  . AMPUTATION Right 09/09/2018   Procedure: AMPUTATION ABOVE KNEE;  Surgeon: [name], MD;  Location: [facility];  Service: Vascular;  Laterality: Right;  . APPLICATION OF WOUND VAC Right 04/25/2016   Procedure: APPLICATION OF WOUND VAC;  Surgeon: Katha Cabal, MD;  Location: [facility];  Service: Vascular;  Laterality: Right;  . APPLICATION OF WOUND VAC Right 12/18/2018   Procedure: APPLICATION OF WOUND VAC;  Surgeon: [name], MD;  Location: [facility];  Service: Vascular;  Laterality: Right;  wound vac  . AV  FISTULA PLACEMENT Left 2015  . CARDIAC CATHETERIZATION N/A 04/30/2016   Procedure: Left Heart Cath and Coronary Angiography;  Surgeon: Wellington Hampshire, MD;  Location: [place] [facility];  Service: Cardiovascular;  Laterality: N/A;  . CATARACT EXTRACTION, BILATERAL    . LEG AMPUTATION BELOW KNEE Left   . LOWER EXTREMITY ANGIOGRAPHY Right 07/24/2018   Procedure: LOWER EXTREMITY ANGIOGRAPHY;  Surgeon: [name], MD;  Location: [place] [facility];  Service: Cardiovascular;  Laterality: Right;  . LOWER EXTREMITY ANGIOGRAPHY Right 08/13/2018   Procedure: LOWER EXTREMITY ANGIOGRAPHY;  Surgeon: [name], MD;  Location: [place] [facility];  Service: Cardiovascular;  Laterality: Right;  . LOWER EXTREMITY ANGIOGRAPHY Right 09/04/2018   Procedure: Lower Extremity Angiography;  Surgeon: [name], MD;  Location: [place] [facility];  Service: Cardiovascular;  Laterality: Right;  . PERIPHERAL VASCULAR CATHETERIZATION N/A 10/03/2015   Procedure: A/V Shuntogram/Fistulagram;  Surgeon: [name], MD;  Location: [place] [facility];  Service: Cardiovascular;  Laterality: N/A;  . PERIPHERAL VASCULAR CATHETERIZATION N/A 01/16/2016   Procedure: Abdominal Aortogram w/Lower Extremity;  Surgeon: [name], MD;  Location: [address] [facility];  Service: Cardiovascular;  Laterality: N/A;  . PERIPHERAL VASCULAR CATHETERIZATION  01/16/2016   Procedure: Lower Extremity Intervention;  Surgeon: [name], MD;  Location: [place] [facility];  Service: Cardiovascular;;  . PERIPHERAL VASCULAR CATHETERIZATION Right 04/27/2016   Procedure: Lower Extremity Angiography;  Surgeon: Katha Cabal, MD;  Location: [place] [facility];  Service: Cardiovascular;  Laterality: Right;  . PERIPHERAL VASCULAR CATHETERIZATION  04/27/2016   Procedure: Lower Extremity Intervention;  Surgeon: Katha Cabal, MD;  Location: [place] [facility];  Service: Cardiovascular;;  . TRANSMETATARSAL AMPUTATION Right 04/28/2016    Procedure: TRANSMETATARSAL AMPUTATION;  Surgeon: Albertine Patricia, DPM;  Location: [facility];  Service: Podiatry;  Laterality: Right;  . WOUND DEBRIDEMENT Right 04/25/2016   Procedure: DEBRIDEMENT WOUND;  Surgeon: Katha Cabal, MD;  Location: [facility];  Service: Vascular;  Laterality: Right;  . WOUND DEBRIDEMENT Right 12/18/2018   Procedure: DEBRIDEMENT WOUND;  Surgeon: [name], MD;  Location: [facility];  Service: Vascular;  Laterality: Right;  right stump    Social History   Socioeconomic History  . Marital status: Married    Spouse name: Not on file  . Number of children: Not on file  . Years of education: Not on file  . Highest education level: Not on file  Occupational History  . Not on file  Social Needs  . Financial resource strain: Not on file  . Food insecurity:    Worry: Patient refused    Inability: Patient refused  . Transportation needs:    Medical: No    Non-medical: No  Tobacco Use  . Smoking status: Never Smoker  . Smokeless tobacco: Never Used  Substance and Sexual Activity  .  Alcohol use: No  . Drug use: No  . Sexual activity: Not on file  Lifestyle  . Physical activity:    Days per week: Not on file    Minutes per session: Not on file  . Stress: Not at all  Relationships  . Social connections:    Talks on phone: More than three times a week    Gets together: Three times a week    Attends religious service: Not on file    Active member of club or organization: Not on file    Attends meetings of clubs or organizations: Not on file    Relationship status: Not on file  . Intimate partner violence:    Fear of current or ex partner: No    Emotionally abused: Not on file    Physically abused: Not on file    Forced sexual activity: Not on file  Other Topics Concern  . Not on file  Social History Narrative  . Not on file    Family History  Problem Relation Age of Onset  . Diabetes Father   . Prostate cancer Father     No Known Allergies    Review of Systems   Review of Systems: Negative Unless Checked Constitutional: [] Weight loss  [] Fever  [] Chills Cardiac: [] Chest pain   []  Atrial Fibrillation  [] Palpitations   [] Shortness of breath when laying flat   [] Shortness of breath with exertion. [] Shortness of breath at rest Vascular:  [] Pain in legs with walking   [] Pain in legs with standing [] Pain in legs when laying flat   [] Claudication    [] Pain in feet when laying flat    [] History of DVT   [] Phlebitis   [] Swelling in legs   [] Varicose veins   [] Non-healing ulcers Pulmonary:   [] Uses home oxygen   [] Productive cough   [] Hemoptysis   [] Wheeze  [] COPD   [] Asthma Neurologic:  [] Dizziness   [] Seizures  [] Blackouts [x] History of stroke   [] History of TIA  [] Aphasia   [] Temporary Blindness   [] Weakness or numbness in arm   [x] Weakness or numbness in leg Musculoskeletal:   [] Joint swelling   [] Joint pain   [] Low back pain  []  History of Knee Replacement [] Arthritis [] back Surgeries  []  Spinal Stenosis    Hematologic:  [] Easy bruising  [] Easy bleeding   [] Hypercoagulable state   [x] Anemic Gastrointestinal:  [] Diarrhea   [] Vomiting  [] Gastroesophageal reflux/heartburn   [] Difficulty swallowing. [] Abdominal pain Genitourinary:  [] Chronic kidney disease   [] Difficult urination  [] Anuric   [] Blood in urine [] Frequent urination  [] Burning with urination   [] Hematuria Skin:  [] Rashes   [] Ulcers [x] Wounds Psychological:  [] History of anxiety   []  History of major depression  []  Memory Difficulties      OBJECTIVE:   Physical Exam  BP 101/64 (BP Location: Right Arm)   Pulse 78   Resp 16   Gen: WD/WN, NAD Head: [place]/AT, No temporalis wasting.  Ear/Nose/Throat: Hearing grossly intact, nares w/o erythema or drainage Eyes: PER, EOMI, sclera nonicteric.  Neck: Supple, no masses.  No JVD.  Pulmonary:  Good air movement, no use of accessory muscles.  Cardiac: RRR Vascular:  Left radiocephalic fistula Vessel Right Left  Radial Palpable  Palpable   Gastrointestinal: soft, non-distended. No guarding/no peritoneal signs.  Musculoskeletal: M/S 5/5 throughout.    Bilateral above-knee amputations Neurologic: Pain and light touch intact in extremities.  Symmetrical.  Speech is fluent. Motor exam as listed above. Psychiatric: Judgment intact, Mood & affect appropriate for  pt's clinical situation. Dermatologic: No Venous rashes. No Ulcers Noted.  No changes consistent with cellulitis. Lymph : No Cervical lymphadenopathy, no lichenification or skin changes of chronic lymphedema.       ASSESSMENT AND PLAN:  1. Complication of above knee amputation stump ([place]) We have unsuccessfully tried multiple modalities for wound healing.  At this time, after consulting with Dr. do we feel that it may be best if wound care is assisted by the wound clinic.  We have placed a referral for him to be seen in their office.  The patient also complains of pain in his stump.  Previously the patient has denied wanting pain pills however he states that this is been a miscommunication.  Therefore we will try him on tramadol.  Also the wound appears to be a little wet with some area of erythema around it so we will also do a short course of Keflex.  I have also given himself Silvadene to apply to the wound in addition to the wet-to-dry dressings.  We will see the patient back in 6 weeks following treatment with the wound clinic in order to see what progress has been made.  - Ambulatory referral to Wound Clinic - traMADol (ULTRAM) 50 MG tablet; Take 2 tablets (100 mg total) by mouth every 6 (six) hours as needed for up to 7 days.  Dispense: 50 tablet; Refill: 0 - cephALEXin (KEFLEX) 500 MG capsule; Take 1 capsule (500 mg total) by mouth 3 (three) times daily.  Dispense: 21 capsule; Refill: 0 - silver sulfADIAZINE (SILVADENE) 1 % cream; Apply 1 application topically daily.  Dispense: 400 g; Refill: 0  2. Type 2 diabetes mellitus with diabetic peripheral  angiopathy with gangrene, unspecified whether long term insulin use (HCC) Continue hypoglycemic medications as already ordered, these medications have been reviewed and there are no changes at this time.  Hgb A1C to be monitored as already arranged by primary service   3. ESRD on dialysis [facility]) Patient currently reports no issues with dialysis access.  He has good thrill and bruit.  We will continue with follow-up on current schedule.   Current Outpatient Medications on File Prior to Visit  Medication Sig Dispense Refill  . acetaminophen (TYLENOL) 325 MG tablet Take 650 mg by mouth every 6 (six) hours as needed.    Marland Kitchen amiodarone (PACERONE) 200 MG tablet Take 1 tablet (200 mg total) by mouth daily. 90 tablet 3  . aspirin EC 81 MG tablet Take 81 mg by mouth daily.    Marland Kitchen b complex-vitamin c-folic acid (NEPHRO-VITE) 0.8 MG TABS tablet Take 1 tablet by mouth daily.    . cephALEXin (KEFLEX) 500 MG capsule Take 1 capsule (500 mg total) by mouth 2 (two) times daily. 28 capsule 0  . cinacalcet (SENSIPAR) 60 MG tablet Take 60 mg by mouth daily.     . ferrous sulfate 325 (65 FE) MG EC tablet Take 325 mg by mouth 3 (three) times daily with meals.    . gabapentin (NEURONTIN) 100 MG capsule Take 1 capsule (100 mg total) by mouth 3 (three) times daily. (Patient taking differently: Take 100 mg by mouth 3 (three) times daily as needed. ) 90 capsule 0  . glipiZIDE (GLUCOTROL) 10 MG tablet Take 10 mg by mouth daily.     Marland Kitchen lactulose (CEPHULAC) 20 g packet Take 1 packet (20 g total) by mouth 3 (three) times daily. (Patient taking differently: Take 20 g by mouth daily as needed. ) 30 each 0  .  lidocaine-prilocaine (EMLA) cream Apply 1 application topically as needed (port access).    . midodrine (PROAMATINE) 10 MG tablet Take 1 tablet (10 mg total) by mouth 3 (three) times daily with meals. 30 tablet 0  . Nutritional Supplements (FEEDING SUPPLEMENT, NEPRO CARB STEADY,) LIQD Take 237 mLs by mouth 2 (two) times daily  between meals.  0  . omeprazole (PRILOSEC) 40 MG capsule Take 40 mg by mouth 2 (two) times daily.     . ondansetron (ZOFRAN) 8 MG tablet Take 8 mg by mouth every 8 (eight) hours as needed.     Marland Kitchen oxyCODONE (OXY IR/ROXICODONE) 5 MG immediate release tablet Take 1 tablet (5 mg total) by mouth every 6 (six) hours as needed for moderate pain or severe pain. 20 tablet 0  . polyethylene glycol (MIRALAX / GLYCOLAX) packet Take 17 g by mouth 2 (two) times daily.     . sevelamer carbonate (RENVELA) 800 MG tablet Take 2,400 mg by mouth 3 (three) times daily with meals.     . Wound Dressings (AQUACEL EXTRA HYDROFIBER 6X6) PADS Apply 1 Package topically 3 (three) times a week. 1 each 3  . albuterol (ACCUNEB) 0.63 MG/3ML nebulizer solution Take 3 mLs (0.63 mg total) by nebulization every 4 (four) hours as needed for wheezing. (Patient not taking: Reported on 12/18/2018) 200 mL 3   No current facility-administered medications on file prior to visit.     There are no Patient Instructions on file for this visit. No follow-ups on file.   Kris Hartmann, NP  This note was completed with Sales executive.  Any errors are purely unintentional.

## 2019-02-03 ENCOUNTER — Other Ambulatory Visit: Payer: Self-pay

## 2019-02-03 ENCOUNTER — Encounter: Payer: Medicare Other | Admitting: Physician Assistant

## 2019-02-03 DIAGNOSIS — E11622 Type 2 diabetes mellitus with other skin ulcer: Secondary | ICD-10-CM | POA: Diagnosis not present

## 2019-02-06 NOTE — Progress Notes (Signed)
SUMNER, KIRCHMAN ([id number]) Visit Report for 02/03/2019 Chief Complaint Document Details Patient Name: [name], [name] Date of Service: 02/03/2019 9:00 AM Medical Record Number: [id number] Patient Account Number: [id number] Date of Birth/Sex: 1961/05/16 (58 y.o. M) Treating RN: [name] Primary Care Provider: [name] Other Clinician: Referring Provider: [name] Treating Provider/Extender: [name], HOYT Weeks in Treatment: 1 Information Obtained from: Patient Chief Complaint Right AKA Ulcer at surgical incision Electronic Signature(s) Signed: 02/06/2019 8:49:36 AM By: Worthy Keeler PA-C Entered By: Worthy Keeler on 02/03/2019 09:01:24 [name] ([id number]) -------------------------------------------------------------------------------- Debridement Details Patient Name: [name], Juniper L. Date of Service: 02/03/2019 9:00 AM Medical Record Number: [id number] Patient Account Number: [id number] Date of Birth/Sex: 04/26/1961 (58 y.o. M) Treating RN: [name] Primary Care Provider: [name] Other Clinician: Referring Provider: [name] Treating Provider/Extender: [name], HOYT Weeks in Treatment: 1 Debridement Performed for Wound #1 Right Amputation Site - Above Knee Assessment: Performed By: Physician STONE III, HOYT E., PA-C Debridement Type: Debridement Severity of Tissue Pre Fat layer exposed Debridement: Level of Consciousness (Pre- Awake and Alert procedure): Pre-procedure Verification/Time Yes - 09:20 Out Taken: Start Time: 09:20 Pain Control: Lidocaine 4% Topical Solution Total Area Debrided (L x W): 3.8 (cm) x 7.5 (cm) = 28.5 (cm) Tissue and other material Viable, Non-Viable, Slough, Subcutaneous, Slough debrided: Level: Skin/Subcutaneous Tissue Debridement Description: Excisional Instrument: Curette Bleeding: Minimum Hemostasis Achieved: Pressure End Time: 09:27 Procedural Pain: 0 Post Procedural Pain: 0 Response  to Treatment: Procedure was tolerated well Level of Consciousness Awake and Alert (Post-procedure): Post Debridement Measurements of Total Wound Length: (cm) 3.8 Width: (cm) 7.5 Depth: (cm) 1.8 Volume: (cm) 40.291 Character of Wound/Ulcer Post Debridement: Improved Severity of Tissue Post Debridement: Fat layer exposed Post Procedure Diagnosis Same as Pre-procedure Electronic Signature(s) Signed: 02/03/2019 2:55:40 PM By: [name] Signed: 02/06/2019 8:49:36 AM By: Worthy Keeler PA-C Entered By: [name] on 02/03/2019 09:28:26 [name] ([id number]) -------------------------------------------------------------------------------- HPI Details Patient Name: [name], [name] Date of Service: 02/03/2019 9:00 AM Medical Record Number: [id number] Patient Account Number: [id number] Date of Birth/Sex: [birth year] (58 y.o. 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 He has a history of hypertension, diabetes, congestive heart failure, and states that he is having a lot of discomfort at the wound site unfortunately. There does not appear to be any signs of active infection upon initial inspection today. No fevers, chills, nausea, or vomiting noted at this time.. Silvadene Cream has been utilized at some point on this wound as well. Patient  did have home health coming out but unfortunately the patient and his wife state that they were not happy with the services being rendered and therefore discontinued the service. There does not appear to be any signs of otherwise active infection at this time which is good news. The patient states he's definitely ready for this to heal and has been a symptomatic time since his  surgery and he is becoming somewhat frustrated. Iodoflex also sounds to have previously been used on the wound that is when the patient's wife states the wound seem to get worse not better. As best I can tell [name] has not been utilized. 02/03/19 on evaluation today patient appears to be doing much better compared to last week's evaluation in regard to his ulcer on the right amputation site. He's been tolerating the dressing changes without complication which is good news. The Santyl does seem to be of benefit in fact the wound appears to be doing significantly better compared to my prior evaluation. Overall very pleased in this regard. No fevers, chills, nausea, or vomiting noted at this time. Electronic Signature(s) Signed: 02/06/2019 8:49:36 AM By: Worthy Keeler PA-C Entered By: Worthy Keeler on 02/03/2019 09:33:28 [name] ([id number]) -------------------------------------------------------------------------------- Physical Exam Details Patient Name: [name], [name] Date of Service: 02/03/2019 9:00 AM Medical Record Number: [id number] Patient Account Number: [id number] Date of Birth/Sex: October 09, 1961 (58 y.o. M) Treating RN: [name] Primary Care Provider: [name] Other Clinician: Referring Provider: [name] Treating Provider/Extender: STONE III, HOYT Weeks in Treatment: 1 Constitutional Well-nourished and well-hydrated in no acute distress. Respiratory normal breathing without difficulty. Psychiatric this patient is able to make decisions and demonstrates good insight into disease  process. Alert and Oriented x 3. pleasant and cooperative. Notes Patient's wound bed did require sharp agreement to remove some remaining Slough and a scar that was loosening but not completely removed at this point. He tolerated this today without any significant discomfort which was good news he did have some minimal pain but fortunately nothing too severe. Overall I do feel like the Santyl is of benefit for him currently they do need a refill of that. Electronic Signature(s) Signed: 02/06/2019 8:49:36 AM By: Worthy Keeler PA-C Entered By: Worthy Keeler on 02/03/2019 09:33:57 [name] ([id number]) -------------------------------------------------------------------------------- Physician Orders Details Patient Name: [name], Carla L. Date of Service: 02/03/2019 9:00 AM Medical Record Number: [id number] Patient Account Number: [id number] Date of Birth/Sex: 1961/04/18 (58 y.o. M) Treating RN: [name] Primary Care Provider: [name] Other Clinician: Referring Provider: [name] Treating Provider/Extender: [name], HOYT Weeks in Treatment: 1 Verbal / Phone Orders: No Diagnosis Coding ICD-10 Coding Code Description I73.89 Other specified peripheral vascular diseases L97.812 Non-pressure chronic ulcer of other part of right lower leg with fat layer exposed Z89.611 Acquired absence of right leg above knee E11.622 Type 2 diabetes mellitus with other skin ulcer I50.42 Chronic combined systolic (congestive) and diastolic (congestive) heart failure N18.6 End stage renal disease Z99.2 Dependence on renal dialysis I10 Essential (primary) hypertension I48.0 Paroxysmal atrial fibrillation Wound Cleansing Wound #1 Right Amputation Site - Above Knee o Clean wound with Normal Saline. Anesthetic (add to Medication List) Wound #1 Right Amputation Site - Above Knee o Topical Lidocaine 4% cream applied to wound bed prior to debridement (In Clinic Only). Primary Wound  Dressing Wound #1 Right Amputation Site - Above Knee o Santyl Ointment Secondary Dressing Wound #1 Right Amputation Site - Above Knee o ABD pad - secure with tape o Saline moistened gauze Dressing Change Frequency Wound #1 Right Amputation Site - Above Knee o Change dressing every day. Follow-up Appointments Wound #1 Right Amputation Site - Above Knee o Return Appointment in 1 week. Medications-please add to medication list. Wound #1 Right Amputation Site - Above Knee Langner, Jveon L. ([id number]) o Santyl Enzymatic Ointment Patient Medications Allergies: No Known Drug Allergies Notifications Medication Indication  Start End Santyl 02/03/2019 DOSE topical 250 unit/gram ointment - ointment topical applied nickel thick to the wound bed and then cover with a dressing as directed Electronic Signature(s) Signed: 02/03/2019 9:57:36 AM By: Worthy Keeler PA-C Entered By: Worthy Keeler on 02/03/2019 09:57:36 Spellman, [name] ([id number]) -------------------------------------------------------------------------------- Problem List Details Patient Name: [name], Porter L. Date of Service: 02/03/2019 9:00 AM Medical Record Number: [id number] Patient Account Number: [id number] Date of Birth/Sex: 1961-01-20 (58 y.o. M) Treating RN: [name] Primary Care Provider: [name] Other Clinician: Referring Provider: [name] Treating Provider/Extender: [name], HOYT Weeks in Treatment: 1 Active Problems ICD-10 Evaluated Encounter Code Description Active Date Today Diagnosis I73.89 Other specified peripheral vascular diseases 01/27/2019 No Yes L97.812 Non-pressure chronic ulcer of other part of right lower leg 01/27/2019 No Yes with fat layer exposed Z89.611 Acquired absence of right leg above knee 01/27/2019 No Yes E11.622 Type 2 diabetes mellitus with other skin ulcer 01/27/2019 No Yes I50.42 Chronic combined systolic (congestive) and diastolic 0/62/6948 No  Yes (congestive) heart failure N18.6 End stage renal disease 01/27/2019 No Yes Z99.2 Dependence on renal dialysis 01/27/2019 No Yes I10 Essential (primary) hypertension 01/27/2019 No Yes I48.0 Paroxysmal atrial fibrillation 01/27/2019 No Yes Inactive Problems Resolved Problems D'ARCY, ABRAHA ([id number]) Electronic Signature(s) Signed: 02/06/2019 8:49:36 AM By: Worthy Keeler PA-C Entered By: Worthy Keeler on 02/03/2019 09:01:20 [name] ([id number]) -------------------------------------------------------------------------------- Progress Note Details Patient Name: [name], Beryle L. Date of Service: 02/03/2019 9:00 AM Medical Record Number: [id number] Patient Account Number: [id number] Date of Birth/Sex: Feb 22, 1961 (58 y.o. M) Treating RN: [name] Primary Care Provider: [name] Other Clinician: Referring Provider: [name] Treating Provider/Extender: [name], HOYT Weeks in Treatment: 1 Subjective Chief Complaint Information obtained from Patient Right AKA Ulcer at surgical incision History of Present Illness (HPI) 01/27/19 patient presents today for initial evaluation here in our clinic concerning an issue he's been having with a wound at the right above knee amputation site which began shortly following the surgery in November 2019. 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 He has a history of hypertension, diabetes, congestive heart failure, and states that he is having a lot of discomfort at the wound site unfortunately. There does not appear to be any signs of active infection upon initial inspection today. No fevers, chills, nausea, or vomiting noted at this time.. Silvadene Cream has been utilized at some point on this wound as well. Patient did have home health  coming out but unfortunately the patient and his wife state that they were not happy with the services being rendered and therefore discontinued the service. There does not appear to be any signs of otherwise active infection at this time which is good news. The patient states he's definitely ready for this to heal and has been a symptomatic time since his surgery and he is becoming somewhat frustrated. Iodoflex also sounds to have previously been used on the wound that is when the patient's wife states the wound seem to get worse not better. As best I can tell [name] has not been utilized. 02/03/19 on evaluation today patient appears to be doing much better compared to last week's evaluation in regard to his ulcer on the right amputation site. He's been tolerating the dressing changes without complication which is good news.  The Santyl does seem to be of benefit in fact the wound appears to be doing significantly better compared to my prior evaluation. Overall very pleased in this regard. No fevers, chills, nausea, or vomiting noted at this time. Patient History Information obtained from Patient. Family History Cancer - Father, Diabetes - Siblings, No family history of Hereditary Spherocytosis. Social History Former smoker - ended on 10/16/1994, Marital Status - Married, Alcohol Use - Never, Drug Use - No History, Caffeine Use - Rarely. Medical History Cardiovascular Patient has history of Arrhythmia - A-fib, Congestive Heart Failure, Hypertension, Peripheral Arterial Disease - Bilateral LE amputations Endocrine Patient has history of Type II Diabetes Genitourinary Patient has history of End Stage Renal Disease - Dialysis M,W,F Hospitalization/Surgery History - Right AKA. - Left BKA. 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 Respiratory Denies complaints or symptoms of Chronic or frequent coughs, Shortness of Breath. Cardiovascular Denies complaints or symptoms of Chest pain, LE edema. Psychiatric Denies complaints or symptoms of Anxiety, Claustrophobia. Objective Constitutional Well-nourished and well-hydrated in no acute distress. Vitals Time Taken: 9:06 AM, Height: 67 in, Weight: 220 lbs, BMI: 34.5, Temperature: 97.8 F, Pulse: 79 bpm, Respiratory Rate: 16 breaths/min, Blood Pressure: 132/79 mmHg. Respiratory normal breathing without difficulty. Psychiatric this patient is able to make decisions and demonstrates good insight into disease process. Alert and Oriented x 3. pleasant and cooperative. General Notes: Patient's wound bed did require sharp agreement to remove some remaining Slough and a scar that was loosening but not completely removed at this point. He tolerated this today without any significant discomfort which was good news he did have some minimal pain but fortunately nothing too severe. Overall I do feel like the Santyl is of benefit for him currently they do need a refill of that. Integumentary (Hair, Skin) Wound #1 status is Open. Original cause of wound was Surgical Injury. The wound is located on the Right Amputation Site - Above Knee. The wound measures 3.8cm length x 7.5cm width x 0.4cm depth; 22.384cm^2 area and 8.954cm^3 volume. There is Fat Layer (Subcutaneous Tissue) Exposed exposed. There is no tunneling or undermining noted. There is a large amount of serosanguineous drainage noted. The wound margin is thickened. There is small (1-33%) red, hyper - granulation within the wound bed. There is a large (67-100%) amount of necrotic tissue within the wound bed including Adherent Slough. The periwound skin appearance exhibited: Induration, Maceration,  Hemosiderin Staining. The periwound skin appearance did not exhibit: Callus, Crepitus, Excoriation, Rash, Scarring, Dry/Scaly, Atrophie Blanche, Cyanosis, Ecchymosis, Mottled, Pallor, Rubor, Erythema. 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 There was a Excisional Skin/Subcutaneous Tissue Debridement with a total area of 28.5 sq cm performed by STONE III, HOYT E., PA-C. With the following instrument(s): Curette to remove  Viable and Non-Viable tissue/material. Material removed includes Subcutaneous Tissue and Slough and after achieving pain control using Lidocaine 4% Topical Solution. No specimens were taken. A time out was conducted at 09:20, prior to the start of the procedure. A Minimum amount of bleeding was controlled with Pressure. The procedure was tolerated well with a pain level of 0 throughout and a pain level of 0 following the procedure. Post Debridement Measurements: 3.8cm length x 7.5cm width x 1.8cm depth; 40.291cm^3 volume. Character of Wound/Ulcer Post Debridement is improved. Severity of Tissue Post Debridement is: Fat layer exposed. Post procedure Diagnosis Wound #1: Same as Pre-Procedure Plan Wound Cleansing: Wound #1 Right Amputation Site - Above Knee: Clean wound with Normal Saline. Anesthetic (add to Medication List): Wound #1 Right  Amputation Site - Above Knee: Topical Lidocaine 4% cream applied to wound bed prior to debridement (In Clinic Only). Primary Wound Dressing: Wound #1 Right Amputation Site - Above Knee: Santyl Ointment Secondary Dressing: Wound #1 Right Amputation Site - Above Knee: ABD pad - secure with tape Saline moistened gauze Dressing Change Frequency: Wound #1 Right Amputation Site - Above Knee: Change dressing every day. Follow-up Appointments: GRACYN, SANTILLANES ([id number]) Wound #1 Right Amputation Site - Above Knee: Return Appointment in 1 week. Medications-please add to medication list.: Wound #1 Right Amputation Site - Above Knee: Santyl Enzymatic Ointment The following medication(s) was prescribed: Santyl topical 250 unit/gram ointment ointment topical applied nickel thick to the wound bed and then cover with a dressing as directed starting 02/03/2019 My suggestion at this point is gonna be that we go ahead and continue with the above wound care measures for the next week and the patient as well as his wife are in agreement with plan. We will subsequently see were things stand at follow-up. Please see above for specific wound care orders. We will see patient for re-evaluation in 1 week(s) here in the clinic. If anything worsens or changes patient will contact our office for additional recommendations. Electronic Signature(s) Signed: 02/06/2019 8:49:36 AM By: Worthy Keeler PA-C Entered By: Worthy Keeler on 02/03/2019 09:57:46 [name] ([id number]) -------------------------------------------------------------------------------- ROS/PFSH Details Patient Name: [name], Terique L. Date of Service: 02/03/2019 9:00 AM Medical Record Number: [id number] Patient Account Number: [id number] Date of Birth/Sex: 1961-02-14 (58 y.o. M) Treating RN: [name] Primary Care Provider: [name] Other Clinician: Referring Provider: [name] Treating Provider/Extender: STONE III,  HOYT Weeks in Treatment: 1 Information Obtained From Patient Constitutional Symptoms (General Health) Complaints and Symptoms: Negative for: Fatigue; Fever; Chills; Marked Weight Change Medical History: Past Medical History Notes: Right AKA (September 09, 2018) BLOOD FLOW) ; Left BKA (2015) Gangrene Stroke Respiratory Complaints and Symptoms: Negative for: Chronic or frequent coughs; Shortness of Breath Cardiovascular Complaints and Symptoms: Negative for: Chest pain; LE edema Medical History: Positive for: Arrhythmia - A-fib; Congestive Heart Failure; Hypertension; Peripheral Arterial Disease - Bilateral LE amputations Psychiatric Complaints and Symptoms: Negative for: Anxiety; Claustrophobia Endocrine Medical History: Positive for: Type II Diabetes Time with diabetes: 14 years Treated with: Oral agents Blood sugar tested every day: No Blood sugar testing results: Breakfast: 128; Lunch: 145 Genitourinary Medical History: Positive for: End Stage Renal Disease - Dialysis M,W,F Immunizations TEJ, MURDAUGH. ([id number]) Pneumococcal Vaccine: Received Pneumococcal Vaccination: Yes Implantable Devices None Hospitalization / Surgery History Type of Hospitalization/Surgery Right AKA Left BKA Family and Social History Cancer: Yes - Father; Diabetes: Yes - Siblings; Hereditary Spherocytosis: No; Former smoker - ended on 10/16/1994; Marital Status - Married; Alcohol Use:  Never; Drug Use: No History; Caffeine Use: Rarely Physician Affirmation I have reviewed and agree with the above information. Electronic Signature(s) Signed: 02/03/2019 2:55:40 PM By: [name] Signed: 02/06/2019 8:49:36 AM By: Worthy Keeler PA-C Entered By: Worthy Keeler on 02/03/2019 09:33:43 Ocon, [name] ([id number]) -------------------------------------------------------------------------------- SuperBill Details Patient Name: [name], Emily L. Date of Service: 02/03/2019 Medical Record Number:  [id number] Patient Account Number: [id number] Date of Birth/Sex: 1961-05-26 (58 y.o. M) Treating RN: [name] Primary Care Provider: [name] Other Clinician: Referring Provider: [name] Treating Provider/Extender: [name], HOYT Weeks in Treatment: 1 Diagnosis Coding ICD-10 Codes Code Description I73.89 Other specified peripheral vascular diseases L97.812 Non-pressure chronic ulcer of other part of right lower leg with fat layer exposed Z89.611 Acquired absence of right leg above knee E11.622 Type 2 diabetes mellitus with other skin ulcer I50.42 Chronic combined systolic (congestive) and diastolic (congestive) heart failure N18.6 End stage renal disease Z99.2 Dependence on renal dialysis I10 Essential (primary) hypertension I48.0 Paroxysmal atrial fibrillation Facility Procedures CPT4 Code Description: 81771165 11042 - DEB SUBQ TISSUE 20 SQ CM/< ICD-10 Diagnosis Description L97.812 Non-pressure chronic ulcer of other part of right lower leg wi Modifier: th fat layer expo Quantity: 1 sed CPT4 Code Description: 79038333 11045 - DEB SUBQ TISS EA ADDL 20CM ICD-10 Diagnosis Description L97.812 Non-pressure chronic ulcer of other part of right lower leg wi Modifier: th fat layer expo Quantity: 1 sed Physician Procedures CPT4 Code Description: 8329191 66060 - WC PHYS SUBQ TISS 20 SQ CM ICD-10 Diagnosis Description O45.997 Non-pressure chronic ulcer of other part of right lower leg with Modifier: fat layer expo Quantity: 1 sed CPT4 Code Description: [id number] 53202 - WC PHYS SUBQ TISS EA ADDL 20 CM ICD-10 Diagnosis Description B34.356 Non-pressure chronic ulcer of other part of right lower leg with Modifier: fat layer expo Quantity: 1 sed Electronic Signature(s) Signed: 02/06/2019 8:49:36 AM By: Worthy Keeler PA-C Entered By: Worthy Keeler on 02/03/2019 09:58:07

## 2019-02-06 NOTE — Progress Notes (Signed)
Kristopher, [name] ([id number]) Visit Report for 02/03/2019 Arrival Information Details Patient Name: [name], [name] Date of Service: 02/03/2019 9:00 AM Medical Record Number: [id number] Patient Account Number: [id number] Date of Birth/Sex: 09-23-1961 (58 y.o. M) Treating RN: Cornell Barman Primary Care Song Myre: [name] Other Clinician: Referring Mignonne Afonso: [name] Treating Jhoselyn Ruffini/Extender: [name], HOYT Weeks in Treatment: 1 Visit Information History Since Last Visit Added or deleted any medications: No Patient Arrived: Wheel Chair Any new allergies or adverse reactions: No Arrival Time: 09:02 Had a fall or experienced change in No Accompanied By: wife activities of daily living that may affect Transfer Assistance: Manual risk of falls: Patient Identification Verified: Yes Signs or symptoms of abuse/neglect since last visito No Secondary Verification Process Completed: Yes Hospitalized since last visit: No Patient Has Alerts: Yes Implantable device outside of the clinic excluding No Patient Alerts: Type II Diabetic cellular tissue based products placed in the center Aspirin 81mg  since last visit: Pain Present Now: No Electronic Signature(s) Signed: 02/03/2019 3:23:54 PM By: Gretta Cool, BSN, RN, CWS, Kim RN, BSN Entered By: Gretta Cool, BSN, RN, CWS, Kim on 02/03/2019 09:05:54 [name] ([id number]) -------------------------------------------------------------------------------- Encounter Discharge Information Details Patient Name: [name], [name] Date of Service: 02/03/2019 9:00 AM Medical Record Number: [id number] Patient Account Number: [id number] Date of Birth/Sex: June 22, 1961 (58 y.o. M) Treating RN: [name] Primary Care Zeeva Courser: [name] Other Clinician: Referring Kam Kushnir: [name] Treating Keymani Glynn/Extender: [name], HOYT Weeks in Treatment: 1 Encounter Discharge Information Items Post Procedure Vitals Discharge Condition: Stable Temperature  (F): 97.8 Ambulatory Status: Wheelchair Pulse (bpm): 79 Discharge Destination: Home Respiratory Rate (breaths/min): 16 Transportation: Private Auto Blood Pressure (mmHg): 132/79 Accompanied By: spouse Schedule Follow-up Appointment: Yes Clinical Summary of Care: Electronic Signature(s) Signed: 02/03/2019 2:55:40 PM By: [name] Entered By: [name] on 02/03/2019 09:45:14 [place], [name] ([id number]) -------------------------------------------------------------------------------- Lower Extremity Assessment Details Patient Name: [name], Theran L. Date of Service: 02/03/2019 9:00 AM Medical Record Number: [id number] Patient Account Number: [id number] Date of Birth/Sex: 1961-10-09 (58 y.o. M) Treating RN: Cornell Barman Primary Care Sanjuana Mruk: [name] Other Clinician: Referring Khila Papp: [name] Treating Blessing Ozga/Extender: [name], HOYT Weeks in Treatment: 1 Notes Patient has bilateral amputations of legs Electronic Signature(s) Signed: 02/03/2019 3:23:54 PM By: Gretta Cool, BSN, RN, CWS, Kim RN, BSN Entered By: Gretta Cool, BSN, RN, CWS, Kim on 02/03/2019 09:10:58 [name] ([id number]) -------------------------------------------------------------------------------- Multi Wound Chart Details Patient Name: [name], Ren L. Date of Service: 02/03/2019 9:00 AM Medical Record Number: [id number] Patient Account Number: [id number] Date of Birth/Sex: 02-24-1961 (58 y.o. M) Treating RN: Cornell Barman Primary Care Borna Wessinger: [name] Other Clinician: Referring Horst Ostermiller: [name] Treating Roizy Harold/Extender: [name], HOYT Weeks in Treatment: 1 Vital Signs Height(in): 67 Pulse(bpm): 79 Weight(lbs): 220 Blood Pressure(mmHg): 132/79 Body Mass Index(BMI): 34 Temperature(F): 97.8 Respiratory Rate 16 (breaths/min): Photos: [N/A:N/A] Wound Location: Right Amputation Site - Above N/A N/A Knee Wounding Event: Surgical Injury N/A N/A Primary Etiology: Arterial  Insufficiency Ulcer N/A N/A Secondary Etiology: Diabetic Wound/Ulcer of the N/A N/A Lower Extremity Comorbid History: Arrhythmia, Congestive Heart N/A N/A Failure, Hypertension, Peripheral Arterial Disease, Type II Diabetes, End Stage Renal Disease Date Acquired: 09/09/2018 N/A N/A Weeks of Treatment: 1 N/A N/A Wound Status: Open N/A N/A Measurements L x W x D 3.8x7.5x0.4 N/A N/A (cm) Area (cm) : 22.384 N/A N/A Volume (cm) : 8.954 N/A N/A % Reduction in Area: -46.20% N/A N/A % Reduction in Volume: 2.60% N/A N/A Classification: Full Thickness Without N/A N/A Exposed Support Structures Exudate Amount:  Large N/A N/A Exudate Type: Serosanguineous N/A N/A Exudate Color: red, brown N/A N/A Wound Margin: Thickened N/A N/A Granulation Amount: Small (1-33%) N/A N/A Granulation Quality: Red, Hyper-granulation N/A N/A Necrotic Amount: Large (67-100%) N/A N/A Cottrill, Saketh L. ([id number]) Exposed Structures: Fat Layer (Subcutaneous N/A N/A Tissue) Exposed: Yes Fascia: No Tendon: No Muscle: No Joint: No Bone: No Epithelialization: None N/A N/A Periwound Skin Texture: Induration: Yes N/A N/A Excoriation: No Callus: No Crepitus: No Rash: No Scarring: No Periwound Skin Moisture: Maceration: Yes N/A N/A Dry/Scaly: No Periwound Skin Color: Hemosiderin Staining: Yes N/A N/A Atrophie Blanche: No Cyanosis: No Ecchymosis: No Erythema: No Mottled: No Pallor: No Rubor: No Tenderness on Palpation: No N/A N/A Treatment Notes Electronic Signature(s) Signed: 02/03/2019 3:23:54 PM By: Gretta Cool, BSN, RN, CWS, Kim RN, BSN Entered By: Gretta Cool, BSN, RN, CWS, Kim on 02/03/2019 09:11:59 [name] ([id number]) -------------------------------------------------------------------------------- [place] Details Patient Name: [name], [name] Date of Service: 02/03/2019 9:00 AM Medical Record Number: [id number] Patient Account Number: [id number] Date of Birth/Sex: 1961/09/21  (58 y.o. M) Treating RN: Cornell Barman Primary Care Cherrill Scrima: [name] Other Clinician: Referring Reford Olliff: [name] Treating Aleria Maheu/Extender: [name], HOYT Weeks in Treatment: 1 Active Inactive Abuse / Safety / Falls / Self Care Management Nursing Diagnoses: Impaired physical mobility Goals: Patient will not develop complications from immobility Date Initiated: 01/27/2019 Target Resolution Date: 04/25/2019 Goal Status: Active Interventions: Assess fall risk on admission and as needed Notes: Orientation to the Wound Care Program Nursing Diagnoses: Knowledge deficit related to the wound healing center program Goals: Patient/caregiver will verbalize understanding of the [facility] Date Initiated: 01/27/2019 Target Resolution Date: 04/25/2019 Goal Status: Active Interventions: Provide education on orientation to the wound center Notes: Pain, Acute or Chronic Nursing Diagnoses: Pain, acute or chronic: actual or potential Goals: Patient will verbalize adequate pain control and receive pain control interventions during procedures as needed Date Initiated: 01/27/2019 Target Resolution Date: 04/25/2019 Goal Status: Active Interventions: Complete pain assessment as per visit requirements KENZO, OZMENT. ([id number]) Notes: Wound/Skin Impairment Nursing Diagnoses: Impaired tissue integrity Goals: Ulcer/skin breakdown will heal within 14 weeks Date Initiated: 01/27/2019 Target Resolution Date: 04/25/2019 Goal Status: Active Interventions: Assess patient/caregiver ability to obtain necessary supplies Assess patient/caregiver ability to perform ulcer/skin care regimen upon admission and as needed Assess ulceration(s) every visit Notes: Electronic Signature(s) Signed: 02/03/2019 3:23:54 PM By: Gretta Cool, BSN, RN, CWS, Kim RN, BSN Entered By: Gretta Cool, BSN, RN, CWS, Kim on 02/03/2019 09:11:09 [name]  ([id number]) -------------------------------------------------------------------------------- Pain Assessment Details Patient Name: [name], Kasyn L. Date of Service: 02/03/2019 9:00 AM Medical Record Number: [id number] Patient Account Number: [id number] Date of Birth/Sex: [birth year] (57 y.o. M) Treating RN: Cornell Barman Primary Care Kiyona Mcnall: [name] Other Clinician: Referring Joniyah Mallinger: [name] Treating Ayla Dunigan/Extender: [name], HOYT Weeks in Treatment: 1 Active Problems Location of Pain Severity and Description of Pain Patient Has Paino No Site Locations Pain Management and Medication Current Pain Management: Notes Patient denies pain at this time. Electronic Signature(s) Signed: 02/03/2019 3:23:54 PM By: Gretta Cool, BSN, RN, CWS, Kim RN, BSN Entered By: Gretta Cool, BSN, RN, CWS, Kim on 02/03/2019 09:06:03 [name] ([id number]) -------------------------------------------------------------------------------- Patient/Caregiver Education Details Patient Name: [name] Date of Service: 02/03/2019 9:00 AM Medical Record Number: [id number] Patient Account Number: [id number] Date of Birth/Gender: [birth year] (57 y.o. M) Treating RN: [name] Primary Care Physician: [name] Other Clinician: Referring Physician: [name] Treating Physician/Extender: Sharalyn Ink in Treatment: 1 Education Assessment Education Provided  To: Patient and Caregiver Education Topics Provided Wound/Skin Impairment: Handouts: Other: wound care as ordered Methods: Demonstration, Explain/Verbal Responses: State content correctly Electronic Signature(s) Signed: 02/03/2019 2:55:40 PM By: [name] Entered By: [name] on 02/03/2019 09:44:28 Spacek, [name] ([id number]) -------------------------------------------------------------------------------- Wound Assessment Details Patient Name: Berenguer, Crue L. Date of Service: 02/03/2019 9:00 AM Medical Record  Number: [id number] Patient Account Number: [id number] Date of Birth/Sex: [birth year] (57 y.o. M) Treating RN: Cornell Barman Primary Care Katha Kuehne: [name] Other Clinician: Referring Aide Wojnar: [name] Treating Youcef Klas/Extender: [name], HOYT Weeks in Treatment: 1 Wound Status Wound Number: 1 Primary Arterial Insufficiency Ulcer Etiology: Wound Location: Right Amputation Site - Above Knee Secondary Diabetic Wound/Ulcer of the Lower Extremity Wounding Event: Surgical Injury Etiology: Date Acquired: 09/09/2018 Wound Open Weeks Of Treatment: 1 Status: Clustered Wound: No Comorbid Arrhythmia, Congestive Heart Failure, History: Hypertension, Peripheral Arterial Disease, Type II Diabetes, End Stage Renal Disease Photos Wound Measurements Length: (cm) 3.8 Width: (cm) 7.5 Depth: (cm) 0.4 Area: (cm) 22.384 Volume: (cm) 8.954 % Reduction in Area: -46.2% % Reduction in Volume: 2.6% Epithelialization: None Tunneling: No Undermining: No Wound Description Full Thickness Without Exposed Support Classification: Structures Wound Margin: Thickened Exudate Large Amount: Exudate Type: Serosanguineous Exudate Color: red, brown Foul Odor After Cleansing: No Slough/Fibrino Yes Wound Bed Granulation Amount: Small (1-33%) Exposed Structure Granulation Quality: Red, Hyper-granulation Fascia Exposed: No Necrotic Amount: Large (67-100%) Fat Layer (Subcutaneous Tissue) Exposed: Yes Necrotic Quality: Adherent Slough Tendon Exposed: No Muscle Exposed: No Joint Exposed: No Lukin, Fabian L. ([id number]) Bone Exposed: No Periwound Skin Texture Texture Color No Abnormalities Noted: No No Abnormalities Noted: No Callus: No Atrophie Blanche: No Crepitus: No Cyanosis: No Excoriation: No Ecchymosis: No Induration: Yes Erythema: No Rash: No Hemosiderin Staining: Yes Scarring: No Mottled: No Pallor: No Moisture Rubor: No No Abnormalities Noted: No Dry / Scaly:  No Maceration: Yes Treatment Notes Wound #1 (Right Amputation Site - Above Knee) Notes Santyl, saline moistened gauze, ABD secured with tape and stretch net Electronic Signature(s) Signed: 02/03/2019 3:23:54 PM By: Gretta Cool, BSN, RN, CWS, Kim RN, BSN Entered By: Gretta Cool, BSN, RN, CWS, Kim on 02/03/2019 09:10:23 [name] ([id number]) -------------------------------------------------------------------------------- Vitals Details Patient Name: [name], Adekunle L. Date of Service: 02/03/2019 9:00 AM Medical Record Number: [id number] Patient Account Number: [id number] Date of Birth/Sex: 03/21/1961 (57 y.o. M) Treating RN: Cornell Barman Primary Care Emony Dormer: [name] Other Clinician: Referring Tabathia Knoche: [name] Treating Roy Tokarz/Extender: [name], HOYT Weeks in Treatment: 1 Vital Signs Time Taken: 09:06 Temperature (F): 97.8 Height (in): 67 Pulse (bpm): 79 Weight (lbs): 220 Respiratory Rate (breaths/min): 16 Body Mass Index (BMI): 34.5 Blood Pressure (mmHg): 132/79 Reference Range: 80 - 120 mg / dl Electronic Signature(s) Signed: 02/03/2019 3:23:54 PM By: Gretta Cool, BSN, RN, CWS, Kim RN, BSN Entered By: Gretta Cool, BSN, RN, CWS, Kim on 02/03/2019 09:06:46

## 2019-02-10 ENCOUNTER — Other Ambulatory Visit: Payer: Self-pay

## 2019-02-10 ENCOUNTER — Encounter: Payer: Medicare Other | Admitting: Physician Assistant

## 2019-02-10 DIAGNOSIS — E11622 Type 2 diabetes mellitus with other skin ulcer: Secondary | ICD-10-CM | POA: Diagnosis not present

## 2019-02-11 NOTE — Progress Notes (Signed)
[name], [name] ([id number]) Visit Report for 02/10/2019 Arrival Information Details Patient Name: [name], [name] Date of Service: 02/10/2019 8:15 AM Medical Record Number: [id number] Patient Account Number: [id number] Date of Birth/Sex: 03-Sep-1961 (58 y.o. M) Treating RN: Harold Barban Primary Care Tyauna Lacaze: [name] Other Clinician: Referring Amiree No: [name] Treating Silus Lanzo/Extender: [name], HOYT Weeks in Treatment: 2 Visit Information History Since Last Visit Added or deleted any medications: No Patient Arrived: Wheel Chair Any new allergies or adverse reactions: No Arrival Time: 08:20 Had a fall or experienced change in No Accompanied By: caregiver activities of daily living that may affect Transfer Assistance: Transfer Board risk of falls: Patient Identification Verified: Yes Signs or symptoms of abuse/neglect since last visito No Secondary Verification Process Completed: Yes Hospitalized since last visit: No Patient Has Alerts: Yes Has Dressing in Place as Prescribed: Yes Patient Alerts: Type II Diabetic Pain Present Now: No Aspirin 81mg  Electronic Signature(s) Signed: 02/11/2019 4:45:14 PM By: Harold Barban Entered By: Harold Barban on 02/10/2019 08:22:14 [name] ([id number]) -------------------------------------------------------------------------------- Encounter Discharge Information Details Patient Name: [name], [name] Date of Service: 02/10/2019 8:15 AM Medical Record Number: [id number] Patient Account Number: [id number] Date of Birth/Sex: 03-31-1961 (58 y.o. M) Treating RN: [name] Primary Care Tahje Borawski: [name] Other Clinician: Referring Shrihan Putt: [name] Treating Lenya Sterne/Extender: [name], HOYT Weeks in Treatment: 2 Encounter Discharge Information Items Post Procedure Vitals Discharge Condition: Stable Temperature (F): 97.8 Ambulatory Status: Wheelchair Pulse (bpm): 74 Discharge Destination:  Home Respiratory Rate (breaths/min): 16 Transportation: Private Auto Blood Pressure (mmHg): 110/57 Accompanied By: spouse Schedule Follow-up Appointment: Yes Clinical Summary of Care: Electronic Signature(s) Signed: 02/10/2019 3:19:36 PM By: [name] Entered By: [name] on 02/10/2019 08:48:55 Stene, [name] ([id number]) -------------------------------------------------------------------------------- Lower Extremity Assessment Details Patient Name: [name], [name] Date of Service: 02/10/2019 8:15 AM Medical Record Number: [id number] Patient Account Number: [id number] Date of Birth/Sex: 08/20/1961 (58 y.o. M) Treating RN: Harold Barban Primary Care Candiace West: [name] Other Clinician: Referring Dalores Weger: [name] Treating Amzie Sillas/Extender: [name], HOYT Weeks in Treatment: 2 Electronic Signature(s) Signed: 02/11/2019 4:45:14 PM By: Harold Barban Entered By: Harold Barban on 02/10/2019 08:26:02 [name] ([id number]) -------------------------------------------------------------------------------- Multi Wound Chart Details Patient Name: [name], [name] Date of Service: 02/10/2019 8:15 AM Medical Record Number: [id number] Patient Account Number: [id number] Date of Birth/Sex: 10-19-1960 (58 y.o. M) Treating RN: [name] Primary Care Fawaz Borquez: [name] Other Clinician: Referring Izekiel Flegel: [name] Treating Markell Schrier/Extender: STONE III, HOYT Weeks in Treatment: 2 Vital Signs Height(in): 67 Pulse(bpm): 74 Weight(lbs): 220 Blood Pressure(mmHg): 110/57 Body Mass Index(BMI): 34 Temperature(F): 97.8 Respiratory Rate 18 (breaths/min): Photos: [N/A:N/A] Wound Location: Right Amputation Site - Above N/A N/A Knee Wounding Event: Surgical Injury N/A N/A Primary Etiology: Arterial Insufficiency Ulcer N/A N/A Secondary Etiology: Diabetic Wound/Ulcer of the N/A N/A Lower Extremity Comorbid History: Arrhythmia, Congestive Heart N/A  N/A Failure, Hypertension, Peripheral Arterial Disease, Type II Diabetes, End Stage Renal Disease Date Acquired: 09/09/2018 N/A N/A Weeks of Treatment: 2 N/A N/A Wound Status: Open N/A N/A Measurements L x W x D 3.6x7.5x0.6 N/A N/A (cm) Area (cm) : 21.206 N/A N/A Volume (cm) : 12.723 N/A N/A % Reduction in Area: -38.50% N/A N/A % Reduction in Volume: -38.50% N/A N/A Classification: Full Thickness Without N/A N/A Exposed Support Structures Exudate Amount: Large N/A N/A Exudate Type: Serosanguineous N/A N/A Exudate Color: red, brown N/A N/A Wound Margin: Thickened N/A N/A Granulation Amount: Small (1-33%) N/A N/A Granulation Quality: Red, Hyper-granulation N/A N/A Necrotic Amount: Large (  67-100%) N/A N/A [name], [name] ([id number]) Exposed Structures: Fat Layer (Subcutaneous N/A N/A Tissue) Exposed: Yes Fascia: No Tendon: No Muscle: No Joint: No Bone: No Epithelialization: None N/A N/A Periwound Skin Texture: Induration: Yes N/A N/A Excoriation: No Callus: No Crepitus: No Rash: No Scarring: No Periwound Skin Moisture: Maceration: Yes N/A N/A Dry/Scaly: No Periwound Skin Color: Hemosiderin Staining: Yes N/A N/A Atrophie Blanche: No Cyanosis: No Ecchymosis: No Erythema: No Mottled: No Pallor: No Rubor: No Temperature: No Abnormality N/A N/A Tenderness on Palpation: No N/A N/A Treatment Notes Electronic Signature(s) Signed: 02/10/2019 3:19:36 PM By: [name] Entered By: [name] on 02/10/2019 08:42:34 [name] ([id number]) -------------------------------------------------------------------------------- [place] Details Patient Name: [name], [name] Date of Service: 02/10/2019 8:15 AM Medical Record Number: [id number] Patient Account Number: [id number] Date of Birth/Sex: Nov 28, 1960 (58 y.o. M) Treating RN: [name] Primary Care Decie Verne: [name] Other Clinician: Referring Fadil Macmaster: [name] Treating  Maridel Pixler/Extender: [name], HOYT Weeks in Treatment: 2 Active Inactive Abuse / Safety / Falls / Self Care Management Nursing Diagnoses: Impaired physical mobility Goals: Patient will not develop complications from immobility Date Initiated: 01/27/2019 Target Resolution Date: 04/25/2019 Goal Status: Active Interventions: Assess fall risk on admission and as needed Notes: Orientation to the Wound Care Program Nursing Diagnoses: Knowledge deficit related to the wound healing center program Goals: Patient/caregiver will verbalize understanding of the [facility] Date Initiated: 01/27/2019 Target Resolution Date: 04/25/2019 Goal Status: Active Interventions: Provide education on orientation to the wound center Notes: Pain, Acute or Chronic Nursing Diagnoses: Pain, acute or chronic: actual or potential Goals: Patient will verbalize adequate pain control and receive pain control interventions during procedures as needed Date Initiated: 01/27/2019 Target Resolution Date: 04/25/2019 Goal Status: Active Interventions: Complete pain assessment as per visit requirements ANUSH, WIEDEMAN. ([id number]) Notes: Wound/Skin Impairment Nursing Diagnoses: Impaired tissue integrity Goals: Ulcer/skin breakdown will heal within 14 weeks Date Initiated: 01/27/2019 Target Resolution Date: 04/25/2019 Goal Status: Active Interventions: Assess patient/caregiver ability to obtain necessary supplies Assess patient/caregiver ability to perform ulcer/skin care regimen upon admission and as needed Assess ulceration(s) every visit Notes: Electronic Signature(s) Signed: 02/10/2019 3:19:36 PM By: [name] Entered By: [name] on 02/10/2019 08:42:22 Panjwani, [name] ([id number]) -------------------------------------------------------------------------------- Pain Assessment Details Patient Name: [name], [name] Date of Service: 02/10/2019 8:15 AM Medical Record Number:  [id number] Patient Account Number: [id number] Date of Birth/Sex: 09/20/1961 (58 y.o. M) Treating RN: Harold Barban Primary Care Marene Gilliam: [name] Other Clinician: Referring Elick Aguilera: [name] Treating Olajuwon Fosdick/Extender: [name], HOYT Weeks in Treatment: 2 Active Problems Location of Pain Severity and Description of Pain Patient Has Paino No Site Locations Pain Management and Medication Current Pain Management: Electronic Signature(s) Signed: 02/11/2019 4:45:14 PM By: Harold Barban Entered By: Harold Barban on 02/10/2019 08:22:40 [name] ([id number]) -------------------------------------------------------------------------------- Patient/Caregiver Education Details Patient Name: [name], Miachel L. Date of Service: 02/10/2019 8:15 AM Medical Record Number: [id number] Patient Account Number: [id number] Date of Birth/Gender: [birth year] (58 y.o. M) Treating RN: [name] Primary Care Physician: [name] Other Clinician: Referring Physician: [name] Treating Physician/Extender: Sharalyn Ink in Treatment: 2 Education Assessment Education Provided To: Patient and Caregiver Education Topics Provided Wound/Skin Impairment: Handouts: Other: wound care as ordered Methods: Demonstration, Explain/Verbal Responses: State content correctly Electronic Signature(s) Signed: 02/10/2019 3:19:36 PM By: [name] Entered By: [name] on 02/10/2019 08:48:11 Castelli, [name] ([id number]) -------------------------------------------------------------------------------- Wound Assessment Details Patient Name: [name], [name] Date of Service: 02/10/2019 8:15 AM Medical Record Number:  [id number] Patient Account Number: [id number] Date of Birth/Sex: [birth year] (57 y.o. 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 Wounding Event: Surgical Injury Etiology: Date Acquired: 09/09/2018 Wound Open Weeks Of Treatment: 2 Status: Clustered Wound: No Comorbid Arrhythmia, Congestive Heart Failure, History: Hypertension, Peripheral Arterial Disease, Type II Diabetes, End Stage Renal Disease Photos Wound Measurements Length: (cm) 3.6 Width: (cm) 7.5 Depth: (cm) 0.6 Area: (cm) 21.206 Volume: (cm) 12.723 % Reduction in Area: -38.5% % Reduction in Volume: -38.5% Epithelialization: None Tunneling: No Undermining: No Wound Description Full Thickness Without Exposed Support Classification: Structures Wound Margin: Thickened Exudate Large Amount: Exudate Type: Serosanguineous Exudate Color: red, brown Foul Odor After Cleansing: No Slough/Fibrino Yes Wound Bed Granulation Amount: Small (1-33%) Exposed Structure Granulation Quality: Red, Hyper-granulation Fascia Exposed: No Necrotic Amount: Large (67-100%) Fat Layer (Subcutaneous Tissue) Exposed: Yes Necrotic Quality: Adherent Slough Tendon Exposed: No Muscle Exposed: No Joint Exposed: No [name], [name] ([id number]) Bone Exposed: No Periwound Skin Texture Texture Color No Abnormalities Noted: No No Abnormalities Noted: No Callus: No Atrophie Blanche: No Crepitus: No Cyanosis: No Excoriation: No Ecchymosis: No Induration: Yes Erythema: No Rash: No Hemosiderin Staining: Yes Scarring: No Mottled: No Pallor: No Moisture Rubor: No No Abnormalities Noted: No Dry / Scaly: No Temperature / Pain Maceration: Yes Temperature: No Abnormality Treatment Notes Wound #1 (Right Amputation Site - Above Knee) Notes Santyl, saline moistened gauze, ABD secured with tape and stretch net Electronic Signature(s) Signed: 02/11/2019 4:45:14 PM By:  Harold Barban Entered By: Harold Barban on 02/10/2019 08:25:50 [name] ([id number]) -------------------------------------------------------------------------------- Vitals Details Patient Name: [name], Kemar L. Date of Service: 02/10/2019 8:15 AM Medical Record Number: [id number] Patient Account Number: [id number] Date of Birth/Sex: April 22, 1961 (58 y.o. M) Treating RN: Harold Barban Primary Care Kaylynn Chamblin: [name] Other Clinician: Referring Siena Poehler: [name] Treating Kagan Mutchler/Extender: STONE III, HOYT Weeks in Treatment: 2 Vital Signs Time Taken: 08:22 Temperature (F): 97.8 Height (in): 67 Pulse (bpm): 74 Weight (lbs): 220 Respiratory Rate (breaths/min): 18 Body Mass Index (BMI): 34.5 Blood Pressure (mmHg): 110/57 Reference Range: 80 - 120 mg / dl Electronic Signature(s) Signed: 02/11/2019 4:45:14 PM By: Harold Barban Entered By: Harold Barban on 02/10/2019 08:22:56

## 2019-02-11 NOTE — Progress Notes (Signed)
[name] ([id number]) Visit Report for 02/10/2019 Chief Complaint Document Details Patient Name: [name], [name] Date of Service: 02/10/2019 8:15 AM Medical Record Number: [id number] Patient Account Number: [id number] Date of Birth/Sex: [birth year] (58 y.o. M) Treating RN: [name] Primary Care Provider: [name] Other Clinician: Referring Provider: [name] Treating Provider/Extender: [name], Anaja Monts Weeks in Treatment: 2 Information Obtained from: Patient Chief Complaint Right AKA Ulcer at surgical incision Electronic Signature(s) Signed: 02/11/2019 7:59:38 AM By: Worthy Keeler PA-C Entered By: Worthy Keeler on 02/10/2019 08:26:52 [name] ([id number]) -------------------------------------------------------------------------------- Debridement Details Patient Name: [name], [name] Date of Service: 02/10/2019 8:15 AM Medical Record Number: [id number] Patient Account Number: [id number] Date of Birth/Sex: [birth year] (58 y.o. M) Treating RN: [name] Primary Care Provider: [name] Other Clinician: Referring Provider: [name] Treating Provider/Extender: [name], Tarun Patchell Weeks in Treatment: 2 Debridement Performed for Wound #1 Right Amputation Site - Above Knee Assessment: Performed By: Physician STONE III, Shafin Pollio E., PA-C Debridement Type: Debridement Severity of Tissue Pre Fat layer exposed Debridement: Level of Consciousness (Pre- Awake and Alert procedure): Pre-procedure Verification/Time Yes - 08:44 Out Taken: Start Time: 08:44 Pain Control: Lidocaine 4% Topical Solution Total Area Debrided (L x W): 3.6 (cm) x 7.5 (cm) = 27 (cm) Tissue and other material Viable, Non-Viable, Slough, Subcutaneous, Slough debrided: Level: Skin/Subcutaneous Tissue Debridement Description: Excisional Instrument: Curette Bleeding: Minimum Hemostasis Achieved: Pressure End Time: 08:47 Procedural Pain: 0 Post Procedural Pain: 0 Response to  Treatment: Procedure was tolerated well Level of Consciousness Awake and Alert (Post-procedure): Post Debridement Measurements of Total Wound Length: (cm) 3.6 Width: (cm) 7.5 Depth: (cm) 0.7 Volume: (cm) 14.844 Character of Wound/Ulcer Post Debridement: Improved Severity of Tissue Post Debridement: Fat layer exposed Post Procedure Diagnosis Same as Pre-procedure Electronic Signature(s) Signed: 02/10/2019 3:19:36 PM By: [name] Signed: 02/11/2019 7:59:38 AM By: Worthy Keeler PA-C Entered By: [name] on 02/10/2019 08:47:29 [name] ([id number]) -------------------------------------------------------------------------------- HPI Details Patient Name: [name], [name] Date of Service: 02/10/2019 8:15 AM Medical Record Number: [id number] Patient Account Number: [id number] Date of Birth/Sex: 30-Jun-1961 (59 y.o. M) Treating RN: [name] Primary Care Provider: [name] Other Clinician: Referring Provider: [name] Treating Provider/Extender: [name], Taren Toops Weeks in Treatment: 2 History of Present Illness HPI Description: 01/27/19 patient presents today for initial evaluation here in our clinic concerning an issue he's been having with a wound at the right above knee amputation site which began shortly following the surgery in November 2019. 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 He has a history of hypertension, diabetes, congestive heart failure, and states that he is having a lot of discomfort at the wound site unfortunately. There does not appear to be any signs of active infection upon initial inspection today. No fevers, chills, nausea, or vomiting noted at this time.. Silvadene Cream has been utilized at some point on this wound as well. Patient did have  home health coming out but unfortunately the patient and his wife state that they were not happy with the services being rendered and therefore discontinued the service. There does not appear to be any signs of otherwise active infection at this time which is good news. The patient states he's definitely ready for this to heal and has been a symptomatic time since his  surgery and he is becoming somewhat frustrated. Iodoflex also sounds to have previously been used on the wound that is when the patient's wife states the wound seem to get worse not better. As best I can tell [name] has not been utilized. 02/03/19 on evaluation today patient appears to be doing much better compared to last week's evaluation in regard to his ulcer on the right amputation site. He's been tolerating the dressing changes without complication which is good news. The Santyl does seem to be of benefit in fact the wound appears to be doing significantly better compared to my prior evaluation. Overall very pleased in this regard. No fevers, chills, nausea, or vomiting noted at this time. 02/10/19 on evaluation today patient's wound actually appears to be showing signs of excellent improvement at this time which is great news. He has been tolerating the dressing changes without complication overall I feel like that there is no signs of active infection at this point which is also good news. I feel like that the [name] is very beneficial. Patient's wife likewise feels like things are doing quite well and is not having pain like he was in the past. Electronic Signature(s) Signed: 02/11/2019 7:59:38 AM By: Worthy Keeler PA-C Entered By: Worthy Keeler on 02/10/2019 11:22:11 [name] ([id number]) -------------------------------------------------------------------------------- Physical Exam Details Patient Name: [name], [name] Date of Service: 02/10/2019 8:15 AM Medical Record Number: [id number] Patient Account Number:  [id number] Date of Birth/Sex: [birth year] (58 y.o. M) Treating RN: [name] Primary Care Provider: [name] Other Clinician: Referring Provider: [name] Treating Provider/Extender: STONE III, Zi Sek Weeks in Treatment: 2 Constitutional Well-nourished and well-hydrated in no acute distress. Respiratory normal breathing without difficulty. Psychiatric this patient is able to make decisions and demonstrates good insight into disease process. Alert and Oriented x 3. pleasant and cooperative. Notes Patient's wound did have some Slough noted on the surface of the wound bed which I was able to mechanically debride off to some very other sharp debridement also was required. Post debridement wound bed appears to be doing much better. Overall I'm extremely pleased with the progress at this point. Electronic Signature(s) Signed: 02/11/2019 7:59:38 AM By: Worthy Keeler PA-C Entered By: Worthy Keeler on 02/10/2019 13:21:32 [name] ([id number]) -------------------------------------------------------------------------------- Physician Orders Details Patient Name: Kristopher Haggard L. Date of Service: 02/10/2019 8:15 AM Medical Record Number: [id number] Patient Account Number: [id number] Date of Birth/Sex: 1961-07-16 (58 y.o. M) Treating RN: [name] Primary Care Provider: [name] Other Clinician: Referring Provider: [name] Treating Provider/Extender: [name], Shadasia Oldfield Weeks in Treatment: 2 Verbal / Phone Orders: No Diagnosis Coding ICD-10 Coding Code Description I73.89 Other specified peripheral vascular diseases L97.812 Non-pressure chronic ulcer of other part of right lower leg with fat layer exposed Z89.611 Acquired absence of right leg above knee 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 Anesthetic (add to Medication List) Wound #1 Right Amputation Site - Above Knee o Topical Lidocaine 4% cream applied to wound bed prior to debridement (In Clinic Only). Primary Wound Dressing Wound #1 Right Amputation Site - Above Knee o Santyl Ointment Secondary Dressing Wound #1 Right Amputation Site - Above Knee o ABD pad - secure with tape o Saline moistened  gauze Dressing Change Frequency Wound #1 Right Amputation Site - Above Knee o Change dressing every day. Follow-up Appointments Wound #1 Right Amputation Site - Above Knee o Return Appointment in 1 week. Medications-please add to medication list. Wound #1 Right Amputation Site - Above Knee MEHTAB, DOLBERRY. ([id number]) o Santyl Enzymatic Ointment Electronic Signature(s) Signed: 02/10/2019 3:19:36 PM By: [name] Signed: 02/11/2019 7:59:38 AM By: Worthy Keeler PA-C Entered By: [name] on 02/10/2019 08:47:49 [name], [name] ([id number]) -------------------------------------------------------------------------------- Problem List Details Patient Name: [name], [name] Date of Service: 02/10/2019 8:15 AM Medical Record Number: [id number] Patient Account Number: [id number] Date of Birth/Sex: [birth year] (58 y.o. M) Treating RN: [name] Primary Care Provider: [name] Other Clinician: Referring Provider: [name] Treating Provider/Extender: [name], Karmelo Bass Weeks in Treatment: 2 Active Problems ICD-10 Evaluated Encounter Code Description Active Date Today Diagnosis I73.89 Other specified peripheral vascular diseases 01/27/2019 No Yes L97.812 Non-pressure chronic ulcer of other part of right lower leg 01/27/2019 No Yes with fat layer exposed Z89.611 Acquired absence of right leg above knee 01/27/2019 No  Yes E11.622 Type 2 diabetes mellitus with other skin ulcer 01/27/2019 No Yes I50.42 Chronic combined systolic (congestive) and diastolic 04/09/9484 No Yes (congestive) heart failure N18.6 End stage renal disease 01/27/2019 No Yes Z99.2 Dependence on renal dialysis 01/27/2019 No Yes I10 Essential (primary) hypertension 01/27/2019 No Yes I48.0 Paroxysmal atrial fibrillation 01/27/2019 No Yes Inactive Problems Resolved Problems [name], [name] ([id number]) Electronic Signature(s) Signed: 02/11/2019 7:59:38 AM By: Worthy Keeler PA-C Entered By: Worthy Keeler on 02/10/2019 08:26:48 [name] ([id number]) -------------------------------------------------------------------------------- Progress Note Details Patient Name: [name], Ata L. Date of Service: 02/10/2019 8:15 AM Medical Record Number: [id number] Patient Account Number: [id number] Date of Birth/Sex: [birth year] (58 y.o. M) Treating RN: [name] Primary Care Provider: [name] Other Clinician: Referring Provider: [name] Treating Provider/Extender: [name], Anton Cheramie Weeks in Treatment: 2 Subjective Chief Complaint Information obtained from Patient Right AKA Ulcer at surgical incision History of Present Illness (HPI) 01/27/19 patient presents today for initial evaluation here in our clinic concerning an issue he's been having with a wound at the right above knee amputation site which began shortly following the surgery in November 2019. 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 He has a history of hypertension, diabetes, congestive heart failure, and states that he is having a lot of discomfort at the wound site unfortunately. There does not appear to be any signs of active infection upon initial inspection today. No fevers,  chills, nausea, or vomiting noted at this time.. Silvadene Cream has been utilized at some point on this wound as well. Patient did have home health coming out but unfortunately the patient and his wife state that they were not happy with the services being rendered and therefore discontinued the service. There does not appear to be any signs of otherwise active infection at this time which is good news. The patient states he's definitely ready for this to heal and has been a symptomatic time since his surgery and he is becoming somewhat frustrated. Iodoflex also sounds to have previously been used on the wound that is when the patient's wife states the wound seem to get worse not better. As best I can tell [name] has not been utilized. 02/03/19 on evaluation today patient  appears to be doing much better compared to last week's evaluation in regard to his ulcer on the right amputation site. He's been tolerating the dressing changes without complication which is good news. The Santyl does seem to be of benefit in fact the wound appears to be doing significantly better compared to my prior evaluation. Overall very pleased in this regard. No fevers, chills, nausea, or vomiting noted at this time. 02/10/19 on evaluation today patient's wound actually appears to be showing signs of excellent improvement at this time which is great news. He has been tolerating the dressing changes without complication overall I feel like that there is no signs of active infection at this point which is also good news. I feel like that the [name] is very beneficial. Patient's wife likewise feels like things are doing quite well and is not having pain like he was in the past. Patient History Information obtained from Patient. Family History Cancer - Father, Diabetes - Siblings, No family history of Hereditary Spherocytosis. Social History Former smoker - ended on 10/16/1994, Marital Status - Married, Alcohol Use - Never,  Drug Use - No History, Caffeine Use - Rarely. Medical History Cardiovascular Patient has history of Arrhythmia - A-fib, Congestive Heart Failure, Hypertension, Peripheral Arterial Disease - Bilateral LE amputations Endocrine Patient has history of Type II Diabetes [name], [name] ([id number]) Genitourinary Patient has history of End Stage Renal Disease - Dialysis M,W,F Hospitalization/Surgery History - Right AKA. - Left BKA. 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 Respiratory Denies complaints or symptoms of Chronic or frequent coughs, Shortness of Breath. Cardiovascular Denies complaints or symptoms of Chest pain, LE edema. Psychiatric Denies complaints or symptoms of Anxiety, Claustrophobia. Objective Constitutional Well-nourished and well-hydrated in no acute distress. Vitals Time Taken: 8:22 AM, Height: 67 in, Weight: 220 lbs, BMI: 34.5, Temperature: 97.8 F, Pulse: 74 bpm, Respiratory Rate: 18 breaths/min, Blood Pressure: 110/57 mmHg. Respiratory normal breathing without difficulty. Psychiatric this patient is able to make decisions and demonstrates good insight into disease process. Alert and Oriented x 3. pleasant and cooperative. General Notes: Patient's wound did have some Slough noted on the surface of the wound bed which I was able to mechanically debride off to some very other sharp debridement also was required. Post debridement wound bed appears to be doing much better. Overall I'm extremely pleased with the progress at this point. Integumentary (Hair, Skin) Wound #1 status is Open. Original cause of wound was Surgical Injury. The wound is located on the Right Amputation Site - Above Knee. The wound measures  3.6cm length x 7.5cm width x 0.6cm depth; 21.206cm^2 area and 12.723cm^3 volume. There is Fat Layer (Subcutaneous Tissue) Exposed exposed. There is no tunneling or undermining noted. There is a large amount of serosanguineous drainage noted. The wound margin is thickened. There is small (1-33%) red, hyper - granulation within the wound bed. There is a large (67-100%) amount of necrotic tissue within the wound bed including Adherent Slough. The periwound skin appearance exhibited: Induration, Maceration, Hemosiderin Staining. The periwound skin appearance did not exhibit: Callus, Crepitus, Excoriation, Rash, Scarring, Dry/Scaly, Atrophie Blanche, Cyanosis, Ecchymosis, Mottled, Pallor, Rubor, Erythema. Periwound temperature was noted as No Abnormality. 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 There was a Excisional Skin/Subcutaneous Tissue Debridement with a total area of 27 sq cm performed by STONE III, Taziah Difatta E., PA-C. With the following instrument(s): Curette to remove Viable and Non-Viable tissue/material. Material removed includes Subcutaneous Tissue and Slough and after achieving pain control using Lidocaine 4% Topical Solution. No specimens were taken. A time out was conducted at 08:44, prior to the start of the procedure. A Minimum amount of bleeding was  controlled with Pressure. The procedure was tolerated well with a pain level of 0 throughout and a pain level of 0 following the procedure. Post Debridement Measurements: 3.6cm length x 7.5cm width x 0.7cm depth; 14.844cm^3 volume. Character of Wound/Ulcer Post Debridement is improved. Severity of Tissue Post Debridement is: Fat layer exposed. Post procedure Diagnosis Wound #1: Same as Pre-Procedure Plan Wound Cleansing: Wound #1 Right Amputation Site - Above Knee: Clean wound with Normal Saline. Anesthetic (add to Medication List): Wound #1 Right Amputation Site - Above Knee: Topical Lidocaine 4% cream applied to wound bed prior to debridement (In Clinic Only). Primary Wound Dressing: Wound #1 Right Amputation Site - Above Knee: Santyl Ointment Secondary Dressing: Wound #1 Right Amputation Site - Above Knee: ABD pad - secure with tape Saline moistened gauze Dressing Change Frequency: Wound #1 Right Amputation Site - Above Knee: Kiss, Shaheen L. ([id number]) Change dressing every day. Follow-up Appointments: Wound #1 Right Amputation Site - Above Knee: Return Appointment in 1 week. Medications-please add to medication list.: Wound #1 Right Amputation Site - Above Knee: Santyl Enzymatic Ointment My suggestion is gonna be that we go ahead and continue with the above wound care measures for the next week. The patient is in agreement the plan. We will subsequently see were things stand at follow-up. Anything changes or worsens in the meantime he will contact the office and let me know. Please see above for specific wound care orders. We will see patient for re-evaluation in 1 week(s) here in the clinic. If anything worsens or changes patient will contact our office for additional recommendations. Electronic Signature(s) Signed: 02/11/2019 7:59:38 AM By: Worthy Keeler PA-C Entered By: Worthy Keeler on 02/10/2019 13:22:05 [name]  ([id number]) -------------------------------------------------------------------------------- ROS/PFSH Details Patient Name: [name], Karl L. Date of Service: 02/10/2019 8:15 AM Medical Record Number: [id number] Patient Account Number: [id number] Date of Birth/Sex: September 22, 1961 (58 y.o. M) Treating RN: [name] Primary Care Provider: [name] Other Clinician: Referring Provider: [name] Treating Provider/Extender: STONE III, Fe Okubo Weeks in Treatment: 2 Information Obtained From Patient Constitutional Symptoms (General Health) Complaints and Symptoms: Negative for: Fatigue; Fever; Chills; Marked Weight Change Medical History: Past Medical History Notes: Right AKA (September 09, 2018) BLOOD FLOW) ; Left BKA (2015) Gangrene Stroke Respiratory Complaints and Symptoms: Negative for: Chronic or frequent coughs; Shortness of Breath Cardiovascular Complaints and Symptoms: Negative for: Chest pain; LE edema Medical History: Positive for: Arrhythmia - A-fib; Congestive Heart Failure; Hypertension; Peripheral Arterial Disease - Bilateral LE amputations Psychiatric Complaints and Symptoms: Negative for: Anxiety; Claustrophobia Endocrine Medical History: Positive for: Type II Diabetes Time with diabetes: 14 years  Treated with: Oral agents Blood sugar tested every day: No Blood sugar testing results: Breakfast: 128; Lunch: 145 Genitourinary Medical History: Positive for: End Stage Renal Disease - Dialysis M,W,F Immunizations [name], [name] ([id number]) Pneumococcal Vaccine: Received Pneumococcal Vaccination: Yes Implantable Devices None Hospitalization / Surgery History Type of Hospitalization/Surgery Right AKA Left BKA Family and Social History Cancer: Yes - Father; Diabetes: Yes - Siblings; Hereditary Spherocytosis: No; Former smoker - ended on 10/16/1994; Marital Status - Married; Alcohol Use: Never; Drug Use: No History; Caffeine Use: Rarely Physician  Affirmation I have reviewed and agree with the above information. Electronic Signature(s) Signed: 02/10/2019 3:19:36 PM By: [name] Signed: 02/11/2019 7:59:38 AM By: Worthy Keeler PA-C Entered By: Worthy Keeler on 02/10/2019 11:22:26 [name] ([id number]) -------------------------------------------------------------------------------- SuperBill Details Patient Name: [name], Jasyn L. Date of Service: 02/10/2019 Medical Record Number: [id number] Patient Account Number: [id number] Date of Birth/Sex: [birth year] (58 y.o. M) Treating RN: [name] Primary Care Provider: [name] Other Clinician: Referring Provider: [name] Treating Provider/Extender: [name], Aishwarya Shiplett Weeks in Treatment: 2 Diagnosis Coding ICD-10 Codes Code Description I73.89 Other specified peripheral vascular diseases L97.812 Non-pressure chronic ulcer of other part of right lower leg with fat layer exposed Z89.611 Acquired absence of right leg above knee E11.622 Type 2 diabetes mellitus with other skin ulcer I50.42 Chronic combined systolic (congestive) and diastolic (congestive) heart failure N18.6 End stage renal disease Z99.2 Dependence on renal dialysis I10 Essential (primary) hypertension I48.0 Paroxysmal atrial fibrillation Facility Procedures CPT4 Code Description: 69678938 11042 - DEB SUBQ TISSUE 20 SQ CM/< ICD-10 Diagnosis Description L97.812 Non-pressure chronic ulcer of other part of right lower leg wi Modifier: th fat layer expo Quantity: 1 sed CPT4 Code Description: 10175102 11045 - DEB SUBQ TISS EA ADDL 20CM ICD-10 Diagnosis Description L97.812 Non-pressure chronic ulcer of other part of right lower leg wi Modifier: th fat layer expo Quantity: 1 sed Physician Procedures CPT4 Code Description: 5852778 24235 - WC PHYS SUBQ TISS 20 SQ CM ICD-10 Diagnosis Description T61.443 Non-pressure chronic ulcer of other part of right lower leg with Modifier: fat layer expo Quantity: 1  sed CPT4 Code Description: 1540086 76195 - WC PHYS SUBQ TISS EA ADDL 20 CM ICD-10 Diagnosis Description K93.267 Non-pressure chronic ulcer of other part of right lower leg with Modifier: fat layer expo Quantity: 1 sed Electronic Signature(s) Signed: 02/11/2019 7:59:38 AM By: Worthy Keeler PA-C Entered By: Worthy Keeler on 02/10/2019 13:23:15

## 2019-02-17 ENCOUNTER — Other Ambulatory Visit: Payer: Self-pay

## 2019-02-17 ENCOUNTER — Encounter: Payer: Medicare Other | Attending: Physician Assistant | Admitting: Physician Assistant

## 2019-02-17 DIAGNOSIS — I132 Hypertensive heart and chronic kidney disease with heart failure and with stage 5 chronic kidney disease, or end stage renal disease: Secondary | ICD-10-CM | POA: Insufficient documentation

## 2019-02-17 DIAGNOSIS — E1122 Type 2 diabetes mellitus with diabetic chronic kidney disease: Secondary | ICD-10-CM | POA: Diagnosis not present

## 2019-02-17 DIAGNOSIS — E1151 Type 2 diabetes mellitus with diabetic peripheral angiopathy without gangrene: Secondary | ICD-10-CM | POA: Diagnosis not present

## 2019-02-17 DIAGNOSIS — Z89611 Acquired absence of right leg above knee: Secondary | ICD-10-CM | POA: Diagnosis not present

## 2019-02-17 DIAGNOSIS — Z87891 Personal history of nicotine dependence: Secondary | ICD-10-CM | POA: Diagnosis not present

## 2019-02-17 DIAGNOSIS — E11622 Type 2 diabetes mellitus with other skin ulcer: Secondary | ICD-10-CM | POA: Diagnosis not present

## 2019-02-17 DIAGNOSIS — Z89512 Acquired absence of left leg below knee: Secondary | ICD-10-CM | POA: Diagnosis not present

## 2019-02-17 DIAGNOSIS — Y835 Amputation of limb(s) as the cause of abnormal reaction of the patient, or of later complication, without mention of misadventure at the time of the procedure: Secondary | ICD-10-CM | POA: Insufficient documentation

## 2019-02-17 DIAGNOSIS — I5042 Chronic combined systolic (congestive) and diastolic (congestive) heart failure: Secondary | ICD-10-CM | POA: Insufficient documentation

## 2019-02-17 DIAGNOSIS — T8789 Other complications of amputation stump: Secondary | ICD-10-CM | POA: Insufficient documentation

## 2019-02-17 DIAGNOSIS — Z992 Dependence on renal dialysis: Secondary | ICD-10-CM | POA: Insufficient documentation

## 2019-02-17 DIAGNOSIS — N186 End stage renal disease: Secondary | ICD-10-CM | POA: Insufficient documentation

## 2019-02-17 DIAGNOSIS — L97812 Non-pressure chronic ulcer of other part of right lower leg with fat layer exposed: Secondary | ICD-10-CM | POA: Diagnosis not present

## 2019-02-17 DIAGNOSIS — Z8673 Personal history of transient ischemic attack (TIA), and cerebral infarction without residual deficits: Secondary | ICD-10-CM | POA: Insufficient documentation

## 2019-02-17 DIAGNOSIS — I480 Paroxysmal atrial fibrillation: Secondary | ICD-10-CM | POA: Insufficient documentation

## 2019-02-17 NOTE — Progress Notes (Signed)
[name] ([id number]) Visit Report for 02/17/2019 Arrival Information Details Patient Name: [name], [name] Date of Service: 02/17/2019 9:00 AM Medical Record Number: [id number] Patient Account Number: [id number] Date of Birth/Sex: [birth year] (57 y.o. M) Treating RN: Cornell Barman Primary Care Marinna Blane: [name] Other Clinician: Referring Karrina Lye: [name] Treating Demarie Hyneman/Extender: [name], HOYT Weeks in Treatment: 3 Visit Information History Since Last Visit Added or deleted any medications: No Patient Arrived: Wheel Chair Any new allergies or adverse reactions: No Arrival Time: 09:09 Had a fall or experienced change in No Accompanied By: wife and activities of daily living that may affect intrepreter risk of falls: Transfer Assistance: None Signs or symptoms of abuse/neglect since last visito No Patient Identification Verified: Yes Hospitalized since last visit: No Secondary Verification Process Yes Implantable device outside of the clinic excluding No Completed: cellular tissue based products placed in the center Patient Has Alerts: Yes since last visit: Patient Alerts: Type II Diabetic Has Dressing in Place as Prescribed: Yes Aspirin 81mg  Pain Present Now: No Notes Patient to remain in wheelchair for the visit. Electronic Signature(s) Signed: 02/17/2019 1:36:45 PM By: Gretta Cool, BSN, RN, CWS, Kim RN, BSN Entered By: Gretta Cool, BSN, RN, CWS, Kim on 02/17/2019 09:10:00 [name] ([id number]) -------------------------------------------------------------------------------- Encounter Discharge Information Details Patient Name: Kristopher Rich, [name] Date of Service: 02/17/2019 9:00 AM Medical Record Number: [id number] Patient Account Number: [id number] Date of Birth/Sex: 1961-07-11 (57 y.o. M) Treating RN: [name] Primary Care Winferd Wease: [name] Other Clinician: Referring Lechelle Wrigley: [name] Treating Melinna Linarez/Extender: [name], HOYT Weeks in  Treatment: 3 Encounter Discharge Information Items Post Procedure Vitals Discharge Condition: Stable Temperature (F): 97.9 Ambulatory Status: Wheelchair Pulse (bpm): 75 Discharge Destination: Home Respiratory Rate (breaths/min): 16 Transportation: Private Auto Blood Pressure (mmHg): 99/60 Accompanied By: wife Schedule Follow-up Appointment: Yes Clinical Summary of Care: Electronic Signature(s) Signed: 02/17/2019 1:35:27 PM By: [name] Entered By: [name] on 02/17/2019 09:41:36 [name] ([id number]) -------------------------------------------------------------------------------- Lower Extremity Assessment Details Patient Name: Kristopher Rich, [name] Date of Service: 02/17/2019 9:00 AM Medical Record Number: [id number] Patient Account Number: [id number] Date of Birth/Sex: 05/25/1961 (57 y.o. 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 Electronic Signature(s) Signed: 02/17/2019 1:36:45 PM By: Gretta Cool, BSN, RN, CWS, Kim RN, BSN Entered By: Gretta Cool, BSN, RN, CWS, Kim on 02/17/2019 09:19:02 [name] ([id number]) -------------------------------------------------------------------------------- Multi Wound Chart Details Patient Name: Kristopher Rich, [name] Date of Service: 02/17/2019 9:00 AM Medical Record Number: [id number] Patient Account Number: [id number] Date of Birth/Sex: 1961/07/20 (57 y.o. M) Treating RN: [name] Primary Care Jaece Ducharme: [name] Other Clinician: Referring Achilles Neville: [name] Treating Bradi Arbuthnot/Extender: STONE III, HOYT Weeks in Treatment: 3 Vital Signs Height(in): 67 Pulse(bpm): 75 Weight(lbs): 220 Blood Pressure(mmHg): 99/60 Body Mass Index(BMI): 34 Temperature(F): 97.9 Respiratory Rate 16 (breaths/min): Photos: [N/A:N/A] Wound Location: Right Amputation Site -  Above N/A N/A Knee Wounding Event: Surgical Injury N/A N/A Primary Etiology: Arterial Insufficiency Ulcer N/A N/A Secondary Etiology: Diabetic Wound/Ulcer of the N/A N/A Lower Extremity Comorbid History: Arrhythmia, Congestive Heart N/A N/A Failure, Hypertension, Peripheral Arterial Disease, Type II Diabetes, End Stage Renal Disease Date Acquired: 09/09/2018 N/A N/A Weeks of Treatment: 3 N/A N/A Wound Status: Open N/A N/A Measurements L x W x D 4x5.5x0.3 N/A N/A (cm) Area (cm) : 17.279 N/A N/A Volume (cm) : 5.184 N/A N/A % Reduction in Area: -12.80% N/A N/A %  Reduction in Volume: 43.60% N/A N/A Classification: Full Thickness Without N/A N/A Exposed Support Structures Exudate Amount: Large N/A N/A Exudate Type: Serosanguineous N/A N/A Exudate Color: red, brown N/A N/A Wound Margin: Thickened N/A N/A Granulation Amount: Medium (34-66%) N/A N/A Granulation Quality: Red, Hyper-granulation N/A N/A Necrotic Amount: Medium (34-66%) N/A N/A Conley, Calin L. ([id number]) Exposed Structures: Fat Layer (Subcutaneous N/A N/A Tissue) Exposed: Yes Fascia: No Tendon: No Muscle: No Joint: No Bone: No Epithelialization: None N/A N/A Periwound Skin Texture: Induration: Yes N/A N/A Excoriation: No Callus: No Crepitus: No Rash: No Scarring: No Periwound Skin Moisture: Maceration: Yes N/A N/A Dry/Scaly: No Periwound Skin Color: Hemosiderin Staining: Yes N/A N/A Atrophie Blanche: No Cyanosis: No Ecchymosis: No Erythema: No Mottled: No Pallor: No Rubor: No Temperature: No Abnormality N/A N/A Tenderness on Palpation: Yes N/A N/A Treatment Notes Electronic Signature(s) Signed: 02/17/2019 1:35:27 PM By: [name] Entered By: [name] on 02/17/2019 09:32:21 [name] ([id number]) -------------------------------------------------------------------------------- [place] Details Patient Name: Kristopher Rich, [name] Date of Service: 02/17/2019 9:00  AM Medical Record Number: [id number] Patient Account Number: [id number] Date of Birth/Sex: 1960-12-11 (57 y.o. M) Treating RN: [name] Primary Care Nyesha Cliff: [name] Other Clinician: Referring Shawnese Magner: [name] Treating Philmore Lepore/Extender: [name], HOYT Weeks in Treatment: 3 Active Inactive Abuse / Safety / Falls / Self Care Management Nursing Diagnoses: Impaired physical mobility Goals: Patient will not develop complications from immobility Date Initiated: 01/27/2019 Target Resolution Date: 04/25/2019 Goal Status: Active Interventions: Assess fall risk on admission and as needed Notes: Orientation to the Wound Care Program Nursing Diagnoses: Knowledge deficit related to the wound healing center program Goals: Patient/caregiver will verbalize understanding of the [facility] Date Initiated: 01/27/2019 Target Resolution Date: 04/25/2019 Goal Status: Active Interventions: Provide education on orientation to the wound center Notes: Pain, Acute or Chronic Nursing Diagnoses: Pain, acute or chronic: actual or potential Goals: Patient will verbalize adequate pain control and receive pain control interventions during procedures as needed Date Initiated: 01/27/2019 Target Resolution Date: 04/25/2019 Goal Status: Active Interventions: Complete pain assessment as per visit requirements ETTORE, TREBILCOCK. ([id number]) Notes: Wound/Skin Impairment Nursing Diagnoses: Impaired tissue integrity Goals: Ulcer/skin breakdown will heal within 14 weeks Date Initiated: 01/27/2019 Target Resolution Date: 04/25/2019 Goal Status: Active Interventions: Assess patient/caregiver ability to obtain necessary supplies Assess patient/caregiver ability to perform ulcer/skin care regimen upon admission and as needed Assess ulceration(s) every visit Notes: Electronic Signature(s) Signed: 02/17/2019 1:35:27 PM By: [name] Entered By: [name] on 02/17/2019  09:32:12 [name] ([id number]) -------------------------------------------------------------------------------- Pain Assessment Details Patient Name: Kristopher Rich, [name] Date of Service: 02/17/2019 9:00 AM Medical Record Number: [id number] Patient Account Number: [id number] Date of Birth/Sex: October 23, 1960 (57 y.o. M) Treating RN: Cornell Barman Primary Care Joanna Hall: [name] Other Clinician: Referring Lannie Heaps: [name] Treating Anjuli Gemmill/Extender: [name], HOYT Weeks in Treatment: 3 Active Problems Location of Pain Severity and Description of Pain Patient Has Paino Yes Site Locations Pain Location: Pain in Ulcers Rate the pain. Current Pain Level: 2 Pain Management and Medication Current Pain Management: Electronic Signature(s) Signed: 02/17/2019 1:36:45 PM By: Gretta Cool, BSN, RN, CWS, Kim RN, BSN Entered By: Gretta Cool, BSN, RN, CWS, Kim on 02/17/2019 09:10:21 [name] ([id number]) -------------------------------------------------------------------------------- Patient/Caregiver Education Details Patient Name: [name] Date of Service: 02/17/2019 9:00 AM Medical Record Number: [id number] Patient Account Number: [id number] Date of Birth/Gender: October 10, 1961 (58 y.o. M) Treating RN: [name] Primary Care Physician: [name] Other Clinician: Referring Physician: [name] Treating Physician/Extender:  STONE III, HOYT Weeks in Treatment: 3 Education Assessment Education Provided To: Patient and Caregiver Education Topics Provided Wound/Skin Impairment: Handouts: Other: wound care as ordered Methods: Demonstration, Explain/Verbal Responses: State content correctly Electronic Signature(s) Signed: 02/17/2019 1:35:27 PM By: [name] Entered By: [name] on 02/17/2019 09:40:09 [name] ([id number]) -------------------------------------------------------------------------------- Wound Assessment Details Patient Name: Laba, [name] Date  of Service: 02/17/2019 9:00 AM Medical Record Number: [id number] Patient Account Number: [id number] Date of Birth/Sex: 27-Aug-1961 (57 y.o. M) Treating RN: Cornell Barman Primary Care Lindel Marcell: [name] Other Clinician: Referring Odes Lolli: [name] Treating Peytin Dechert/Extender: [name], HOYT Weeks in Treatment: 3 Wound Status Wound Number: 1 Primary Arterial Insufficiency Ulcer Etiology: Wound Location: Right Amputation Site - Above Knee Secondary Diabetic Wound/Ulcer of the Lower Extremity Wounding Event: Surgical Injury Etiology: Date Acquired: 09/09/2018 Wound Open Weeks Of Treatment: 3 Status: Clustered Wound: No Comorbid Arrhythmia, Congestive Heart Failure, History: Hypertension, Peripheral Arterial Disease, Type II Diabetes, End Stage Renal Disease Photos Wound Measurements Length: (cm) 4 Width: (cm) 5.5 Depth: (cm) 0.3 Area: (cm) 17.279 Volume: (cm) 5.184 % Reduction in Area: -12.8% % Reduction in Volume: 43.6% Epithelialization: None Tunneling: No Undermining: No Wound Description Full Thickness Without Exposed Support Classification: Structures Wound Margin: Thickened Exudate Large Amount: Exudate Type: Serosanguineous Exudate Color: red, brown Foul Odor After Cleansing: No Slough/Fibrino Yes Wound Bed Granulation Amount: Medium (34-66%) Exposed Structure Granulation Quality: Red, Hyper-granulation Fascia Exposed: No Necrotic Amount: Medium (34-66%) Fat Layer (Subcutaneous Tissue) Exposed: Yes Necrotic Quality: Adherent Slough Tendon Exposed: No Muscle Exposed: No Joint Exposed: No Hannay, [name] ([id number]) Bone Exposed: No Periwound Skin Texture Texture Color No Abnormalities Noted: No No Abnormalities Noted: No Callus: No Atrophie Blanche: No Crepitus: No Cyanosis: No Excoriation: No Ecchymosis: No Induration: Yes Erythema: No Rash: No Hemosiderin Staining: Yes Scarring: No Mottled: No Pallor: No Moisture Rubor: No No  Abnormalities Noted: No Dry / Scaly: No Temperature / Pain Maceration: Yes Temperature: No Abnormality Tenderness on Palpation: Yes Treatment Notes Wound #1 (Right Amputation Site - Above Knee) Notes Santyl, saline moistened gauze, ABD secured with tape and stretch net Electronic Signature(s) Signed: 02/17/2019 1:36:45 PM By: Gretta Cool, BSN, RN, CWS, Kim RN, BSN Entered By: Gretta Cool, BSN, RN, CWS, Kim on 02/17/2019 09:16:53 [name] ([id number]) -------------------------------------------------------------------------------- Vitals Details Patient Name: Kristopher Rich, Selestino L. Date of Service: 02/17/2019 9:00 AM Medical Record Number: [id number] Patient Account Number: [id number] Date of Birth/Sex: July 31, 1961 (57 y.o. M) Treating RN: Cornell Barman Primary Care Sherle Mello: [name] Other Clinician: Referring Mariel Lukins: [name] Treating Esley Brooking/Extender: [name], HOYT Weeks in Treatment: 3 Vital Signs Time Taken: 09:10 Temperature (F): 97.9 Height (in): 67 Pulse (bpm): 75 Weight (lbs): 220 Respiratory Rate (breaths/min): 16 Body Mass Index (BMI): 34.5 Blood Pressure (mmHg): 99/60 Reference Range: 80 - 120 mg / dl Electronic Signature(s) Signed: 02/17/2019 1:36:45 PM By: Gretta Cool, BSN, RN, CWS, Kim RN, BSN Entered By: Gretta Cool, BSN, RN, CWS, Kim on 02/17/2019 09:12:01

## 2019-02-19 NOTE — Progress Notes (Signed)
[name], [name] ([id number]) Visit Report for 02/17/2019 Chief Complaint Document Details Patient Name: Kristopher, BASALDUA [name] Date of Service: 02/17/2019 9:00 AM Medical Record Number: [id number] Patient Account Number: [id number] Date of Birth/Sex: October 16, 1960 (57 y.o. M) Treating RN: [name] Primary Care Provider: [name] Other Clinician: Referring Provider: [name] Treating Provider/Extender: [name], HOYT Weeks in Treatment: 3 Information Obtained from: Patient Chief Complaint Right AKA Ulcer at surgical incision Electronic Signature(s) Signed: 02/17/2019 3:19:36 PM By: Worthy Keeler PA-C Entered By: Worthy Keeler on 02/17/2019 09:42:42 [name] ([id number]) -------------------------------------------------------------------------------- Debridement Details Patient Name: [name], [name] Date of Service: 02/17/2019 9:00 AM Medical Record Number: [id number] Patient Account Number: [id number] Date of Birth/Sex: 18-Jun-1961 (57 y.o. M) Treating RN: [name] Primary Care Provider: [name] Other Clinician: Referring Provider: [name] Treating Provider/Extender: [name], HOYT Weeks in Treatment: 3 Debridement Performed for Wound #1 Right Amputation Site - Above Knee Assessment: Performed By: Physician STONE III, HOYT E., PA-C Debridement Type: Chemical/Enzymatic/Mechanical Agent Used: Santyl Severity of Tissue Pre Fat layer exposed Debridement: Level of Consciousness (Pre- Awake and Alert procedure): Pre-procedure Verification/Time Yes - 09:40 Out Taken: Start Time: 09:40 Pain Control: Lidocaine 4% Topical Solution Instrument: Other : tongue blade Bleeding: None End Time: 09:41 Procedural Pain: 0 Post Procedural Pain: 0 Response to Treatment: Procedure was tolerated well Level of Consciousness Awake and Alert (Post-procedure): Post Debridement Measurements of Total Wound Length: (cm) 4 Width: (cm) 5.5 Depth: (cm) 0.3 Volume:  (cm) 5.184 Character of Wound/Ulcer Post Debridement: Stable Severity of Tissue Post Debridement: Fat layer exposed Post Procedure Diagnosis Same as Pre-procedure Electronic Signature(s) Signed: 02/17/2019 1:35:27 PM By: [name] Signed: 02/17/2019 3:19:36 PM By: Worthy Keeler PA-C Entered By: [name] on 02/17/2019 09:39:29 Meter, [name] ([id number]) -------------------------------------------------------------------------------- HPI Details Patient Name: [name], [name] Date of Service: 02/17/2019 9:00 AM Medical Record Number: [id number] Patient Account Number: [id number] Date of Birth/Sex: [birth year] (57 y.o. M) Treating RN: [name] Primary Care Provider: [name] Other Clinician: Referring Provider: [name] Treating Provider/Extender: [name], HOYT Weeks in Treatment: 3 History of Present Illness HPI Description: 01/27/19 patient presents today for initial evaluation here in our clinic concerning an issue he's been having with a wound at the right above knee amputation site which began shortly following the surgery in November 2019. 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 He has a history of hypertension, diabetes, congestive heart failure, and states that he is having a lot of discomfort at the wound site unfortunately. There does not appear to be any signs of active infection upon initial inspection today. No fevers, chills, nausea, or vomiting noted at this time.. Silvadene Cream has been utilized at some point on this wound as well. Patient did have home health coming out but unfortunately the patient and his wife state that they were not happy with the services being rendered and therefore discontinued the service. There does not appear to be any signs of  otherwise active infection at this time which is good news. The patient states he's definitely ready for this to heal and has been a symptomatic time since his surgery and he is becoming somewhat frustrated. Iodoflex also sounds to have previously been used on the wound that is when the patient's wife states the wound  seem to get worse not better. As best I can tell [name] has not been utilized. 02/03/19 on evaluation today patient appears to be doing much better compared to last week's evaluation in regard to his ulcer on the right amputation site. He's been tolerating the dressing changes without complication which is good news. The Santyl does seem to be of benefit in fact the wound appears to be doing significantly better compared to my prior evaluation. Overall very pleased in this regard. No fevers, chills, nausea, or vomiting noted at this time. 02/10/19 on evaluation today patient's wound actually appears to be showing signs of excellent improvement at this time which is great news. He has been tolerating the dressing changes without complication overall I feel like that there is no signs of active infection at this point which is also good news. I feel like that the [name] is very beneficial. Patient's wife likewise feels like things are doing quite well and is not having pain like he was in the past. 02/17/19 on evaluation today patient appears to be doing well in regard to the wound on the distal aspect of his right amputation site. He's been tolerating treatment with the Santyl at this point. I think he is nearing completion as far as treatment with cental is concerned I think were very close to getting this significantly better. Fortunately there's no evidence of infection currently. Electronic Signature(s) Signed: 02/17/2019 3:19:36 PM By: Worthy Keeler PA-C Entered By: Worthy Keeler on 02/17/2019 14:39:58 [name]  ([id number]) -------------------------------------------------------------------------------- Physical Exam Details Patient Name: [name], [name] Date of Service: 02/17/2019 9:00 AM Medical Record Number: [id number] Patient Account Number: [id number] Date of Birth/Sex: 05-05-1961 (57 y.o. M) Treating RN: [name] Primary Care Provider: [name] Other Clinician: Referring Provider: [name] Treating Provider/Extender: STONE III, HOYT Weeks in Treatment: 3 Constitutional Well-nourished and well-hydrated in no acute distress. Respiratory normal breathing without difficulty. clear to auscultation bilaterally. Cardiovascular regular rate and rhythm with normal S1, S2. Psychiatric this patient is able to make decisions and demonstrates good insight into disease process. Alert and Oriented x 3. pleasant and cooperative. Notes Patient's wound bed currently showed signs of good granulation at this time. No sharp debridement was really necessary today although I do think that keeping the cental for one more week would likely be of benefit for him due to some the slough buildup this seems to happen I was simply able to mechanically debride this way today with saline and gauze. Electronic Signature(s) Signed: 02/17/2019 3:19:36 PM By: Worthy Keeler PA-C Entered By: Worthy Keeler on 02/17/2019 14:50:43 [name] ([id number]) -------------------------------------------------------------------------------- Physician Orders Details Patient Name: [name] Date of Service: 02/17/2019 9:00 AM Medical Record Number: [id number] Patient Account Number: [id number] Date of Birth/Sex: 12/21/1960 (57 y.o. M) Treating RN: [name] Primary Care Provider: [name] Other Clinician: Referring Provider: [name] Treating Provider/Extender: [name], HOYT Weeks in Treatment: 3 Verbal / Phone Orders: No Diagnosis Coding Wound Cleansing Wound #1 Right Amputation Site  - Above Knee o Clean wound with Normal Saline. Anesthetic (add to Medication List) Wound #1 Right Amputation Site - Above Knee o Topical Lidocaine 4% cream applied to wound bed prior to debridement (In Clinic Only). Primary Wound Dressing Wound #1 Right Amputation Site - Above Knee o Santyl Ointment Secondary Dressing Wound #1 Right Amputation Site - Above Knee o ABD pad - secure with tape o Saline moistened gauze Dressing Change Frequency Wound #1 Right Amputation  Site - Above Knee o Change dressing every day. Follow-up Appointments Wound #1 Right Amputation Site - Above Knee o Return Appointment in 2 weeks. Medications-please add to medication list. Wound #1 Right Amputation Site - Above Knee o Santyl Enzymatic Ointment Electronic Signature(s) Signed: 02/17/2019 1:35:27 PM By: [name] Signed: 02/17/2019 3:19:36 PM By: Worthy Keeler PA-C Entered By: [name] on 02/17/2019 09:36:26 Neria, [name] ([id number]) -------------------------------------------------------------------------------- Problem List Details Patient Name: [name], [name] Date of Service: 02/17/2019 9:00 AM Medical Record Number: [id number] Patient Account Number: [id number] Date of Birth/Sex: 09/06/1961 (57 y.o. M) Treating RN: [name] Primary Care Provider: [name] Other Clinician: Referring Provider: [name] Treating Provider/Extender: [name], HOYT Weeks in Treatment: 3 Active Problems ICD-10 Evaluated Encounter Code Description Active Date Today Diagnosis I73.89 Other specified peripheral vascular diseases 01/27/2019 No Yes L97.812 Non-pressure chronic ulcer of other part of right lower leg 01/27/2019 No Yes with fat layer exposed Z89.611 Acquired absence of right leg above knee 01/27/2019 No Yes E11.622 Type 2 diabetes mellitus with other skin ulcer 01/27/2019 No Yes I50.42 Chronic combined systolic (congestive) and diastolic 2/77/4128 No Yes (congestive)  heart failure N18.6 End stage renal disease 01/27/2019 No Yes Z99.2 Dependence on renal dialysis 01/27/2019 No Yes I10 Essential (primary) hypertension 01/27/2019 No Yes I48.0 Paroxysmal atrial fibrillation 01/27/2019 No Yes Inactive Problems Resolved Problems [name], [name] ([id number]) Electronic Signature(s) Signed: 02/17/2019 3:19:36 PM By: Worthy Keeler PA-C Entered By: Worthy Keeler on 02/17/2019 09:42:37 Lamson, [name] ([id number]) -------------------------------------------------------------------------------- Progress Note Details Patient Name: [name], [name] Date of Service: 02/17/2019 9:00 AM Medical Record Number: [id number] Patient Account Number: [id number] Date of Birth/Sex: 12-Feb-1961 (57 y.o. M) Treating RN: [name] Primary Care Provider: [name] Other Clinician: Referring Provider: [name] Treating Provider/Extender: [name], HOYT Weeks in Treatment: 3 Subjective Chief Complaint Information obtained from Patient Right AKA Ulcer at surgical incision History of Present Illness (HPI) 01/27/19 patient presents today for initial evaluation here in our clinic concerning an issue he's been having with a wound at the right above knee amputation site which began shortly following the surgery in November 2019. 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 He has a history of hypertension, diabetes, congestive heart failure, and states that he is having a lot of discomfort at the wound site unfortunately. There does not appear to be any signs of active infection upon initial inspection today. No fevers, chills, nausea, or vomiting noted at this time.. Silvadene Cream has been utilized at some point on this wound as well. Patient did have home health coming out  but unfortunately the patient and his wife state that they were not happy with the services being rendered and therefore discontinued the service. There does not appear to be any signs of otherwise active infection at this time which is good news. The patient states he's definitely ready for this to heal and has been a symptomatic time since his surgery and he is becoming somewhat frustrated. Iodoflex also sounds to have previously been used on the wound that is when the patient's wife states the wound seem to get worse not better. As best I can tell [name] has not been utilized. 02/03/19 on evaluation today patient appears to be doing much better compared to last week's evaluation in  regard to his ulcer on the right amputation site. He's been tolerating the dressing changes without complication which is good news. The Santyl does seem to be of benefit in fact the wound appears to be doing significantly better compared to my prior evaluation. Overall very pleased in this regard. No fevers, chills, nausea, or vomiting noted at this time. 02/10/19 on evaluation today patient's wound actually appears to be showing signs of excellent improvement at this time which is great news. He has been tolerating the dressing changes without complication overall I feel like that there is no signs of active infection at this point which is also good news. I feel like that the [name] is very beneficial. Patient's wife likewise feels like things are doing quite well and is not having pain like he was in the past. 02/17/19 on evaluation today patient appears to be doing well in regard to the wound on the distal aspect of his right amputation site. He's been tolerating treatment with the Santyl at this point. I think he is nearing completion as far as treatment with cental is concerned I think were very close to getting this significantly better. Fortunately there's no evidence of infection currently. Patient  History Information obtained from Patient. Family History Cancer - Father, Diabetes - Siblings, No family history of Hereditary Spherocytosis. Social History Former smoker - ended on 10/16/1994, Marital Status - Married, Alcohol Use - Never, Drug Use - No History, Caffeine Use - Rarely. Medical History Cardiovascular ARLINGTON, SIGMUND ([id number]) Patient has history of Arrhythmia - A-fib, Congestive Heart Failure, Hypertension, Peripheral Arterial Disease - Bilateral LE amputations Endocrine Patient has history of Type II Diabetes Genitourinary Patient has history of End Stage Renal Disease - Dialysis M,W,F Hospitalization/Surgery History - Right AKA. - Left BKA. 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 Respiratory Denies complaints or symptoms of Chronic or frequent coughs, Shortness of Breath. Cardiovascular Denies complaints or symptoms of Chest pain, LE edema. Psychiatric Denies complaints or symptoms of Anxiety, Claustrophobia. Objective Constitutional Well-nourished and well-hydrated in no acute distress. Vitals Time Taken: 9:10 AM, Height: 67 in, Weight: 220 lbs, BMI: 34.5, Temperature: 97.9 F, Pulse: 75 bpm, Respiratory Rate: 16 breaths/min, Blood Pressure: 99/60 mmHg. Respiratory normal breathing without difficulty. clear to auscultation bilaterally. Cardiovascular regular rate and rhythm with normal S1, S2. Psychiatric this patient is able to make decisions and demonstrates good insight into disease process. Alert and Oriented x 3. pleasant and cooperative. General Notes: Patient's wound bed currently showed signs of good granulation at this time. No sharp debridement was really necessary today although I do think  that keeping the cental for one more week would likely be of benefit for him due to some the slough buildup this seems to happen I was simply able to mechanically debride this way today with saline and gauze. Integumentary (Hair, Skin) Wound #1 status is Open. Original cause of wound was Surgical Injury. The wound is located on the Right Amputation Site - Above Knee. The wound measures 4cm length x 5.5cm width x 0.3cm depth; 17.279cm^2 area and 5.184cm^3 volume. There is Fat Layer (Subcutaneous Tissue) Exposed exposed. There is no tunneling or undermining noted. There is a large [name], Khizar [name] ([id number]) amount of serosanguineous drainage noted. The wound margin is thickened. There is medium (34-66%) red,  hyper - granulation within the wound bed. There is a medium (34-66%) amount of necrotic tissue within the wound bed including Adherent Slough. The periwound skin appearance exhibited: Induration, Maceration, Hemosiderin Staining. The periwound skin appearance did not exhibit: Callus, Crepitus, Excoriation, Rash, Scarring, Dry/Scaly, Atrophie Blanche, Cyanosis, Ecchymosis, Mottled, Pallor, Rubor, Erythema. Periwound temperature was noted as No Abnormality. The periwound has tenderness on palpation. 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 There was a Chemical/Enzymatic/Mechanical  debridement performed by STONE III, HOYT E., PA-C. With the following instrument(s): tongue blade after achieving pain control using Lidocaine 4% Topical Solution. Agent used was [employer]. A time out was conducted at 09:40, prior to the start of the procedure. There was no bleeding. The procedure was tolerated well with a pain level of 0 throughout and a pain level of 0 following the procedure. Post Debridement Measurements: 4cm length x 5.5cm width x 0.3cm depth; 5.184cm^3 volume. Character of Wound/Ulcer Post Debridement is stable. Severity of Tissue Post Debridement is: Fat layer exposed. Post procedure Diagnosis Wound #1: Same as Pre-Procedure Plan Wound Cleansing: Wound #1 Right Amputation Site - Above Knee: Clean wound with Normal Saline. Anesthetic (add to Medication List): Wound #1 Right Amputation Site - Above Knee: Topical Lidocaine 4% cream applied to wound bed prior to debridement (In Clinic Only). Primary Wound Dressing: Wound #1 Right Amputation Site - Above Knee: Santyl Ointment [name], [name] ([id number]) Secondary Dressing: Wound #1 Right Amputation Site - Above Knee: ABD pad - secure with tape Saline moistened gauze Dressing Change Frequency: Wound #1 Right Amputation Site - Above Knee: Change dressing every day. Follow-up Appointments: Wound #1 Right Amputation Site - Above Knee: Return Appointment in 2 weeks. Medications-please add to medication list.: Wound #1 Right Amputation Site - Above Knee: Santyl Enzymatic Ointment I my recommendation at this point is gonna be that we go ahead and continue with the above wound care measures for the next week and the patient is in agreement with plan. Subsequently will see were things stand at follow-up. If anything changes or worsens in the meantime he will contact the office and let me know. Please see above for specific wound care orders. We will see patient for re-evaluation in 2 week(s) here in the clinic. If anything  worsens or changes patient will contact our office for additional recommendations. Electronic Signature(s) Signed: 02/17/2019 3:19:36 PM By: Worthy Keeler PA-C Entered By: Worthy Keeler on 02/17/2019 14:51:18 [name] ([id number]) -------------------------------------------------------------------------------- ROS/PFSH Details Patient Name: [name], Demorris [name] Date of Service: 02/17/2019 9:00 AM Medical Record Number: [id number] Patient Account Number: [id number] Date of Birth/Sex: 02-13-1961 (57 y.o. M) Treating RN: [name] Primary Care Provider: [name] Other Clinician: Referring Provider: [name] Treating Provider/Extender: STONE III, HOYT Weeks in Treatment: 3 Information Obtained From Patient Constitutional Symptoms (General Health) Complaints and Symptoms: Negative for: Fatigue; Fever; Chills; Marked Weight Change Medical History: Past Medical History Notes: Right AKA (September 09, 2018) BLOOD FLOW) ; Left BKA (2015) Gangrene Stroke Respiratory Complaints  and Symptoms: Negative for: Chronic or frequent coughs; Shortness of Breath Cardiovascular Complaints and Symptoms: Negative for: Chest pain; LE edema Medical History: Positive for: Arrhythmia - A-fib; Congestive Heart Failure; Hypertension; Peripheral Arterial Disease - Bilateral LE amputations Psychiatric Complaints and Symptoms: Negative for: Anxiety; Claustrophobia Endocrine Medical History: Positive for: Type II Diabetes Time with diabetes: 14 years Treated with: Oral agents Blood sugar tested every day: No Blood sugar testing results: Breakfast: 128; Lunch: 145 Genitourinary Medical History: Positive for: End Stage Renal Disease - Dialysis M,W,F Immunizations [name], [name] ([id number]) Pneumococcal Vaccine: Received Pneumococcal Vaccination: Yes Implantable Devices None Hospitalization / Surgery History Type of Hospitalization/Surgery Right AKA Left BKA Family and Social  History Cancer: Yes - Father; Diabetes: Yes - Siblings; Hereditary Spherocytosis: No; Former smoker - ended on 10/16/1994; Marital Status - Married; Alcohol Use: Never; Drug Use: No History; Caffeine Use: Rarely Physician Affirmation I have reviewed and agree with the above information. Electronic Signature(s) Signed: 02/17/2019 3:19:36 PM By: Worthy Keeler PA-C Signed: 02/18/2019 4:44:18 PM By: [name] Entered By: Worthy Keeler on 02/17/2019 14:50:23 [name] ([id number]) -------------------------------------------------------------------------------- SuperBill Details Patient Name: [name], Adal [name] Date of Service: 02/17/2019 Medical Record Number: [id number] Patient Account Number: [id number] Date of Birth/Sex: 1961-05-21 (58 y.o. M) Treating RN: [name] Primary Care Provider: [name] Other Clinician: Referring Provider: [name] Treating Provider/Extender: [name], HOYT Weeks in Treatment: 3 Diagnosis Coding ICD-10 Codes Code Description I73.89 Other specified peripheral vascular diseases L97.812 Non-pressure chronic ulcer of other part of right lower leg with fat layer exposed Z89.611 Acquired absence of right leg above knee E11.622 Type 2 diabetes mellitus with other skin ulcer I50.42 Chronic combined systolic (congestive) and diastolic (congestive) heart failure N18.6 End stage renal disease Z99.2 Dependence on renal dialysis I10 Essential (primary) hypertension I48.0 Paroxysmal atrial fibrillation Facility Procedures CPT4 Code: 60737106 Description: 26948 - DEBRIDE W/O ANES NON SELECT Modifier: Quantity: 1 Physician Procedures CPT4 Code Description: 5462703 50093 - WC PHYS LEVEL 4 - EST PT ICD-10 Diagnosis Description I73.89 Other specified peripheral vascular diseases L97.812 Non-pressure chronic ulcer of other part of right lower leg w Z89.611 Acquired absence of right leg  above knee E11.622 Type 2 diabetes mellitus with other skin  ulcer Modifier: ith fat layer expo Quantity: 1 sed Electronic Signature(s) Signed: 02/17/2019 3:19:36 PM By: Worthy Keeler PA-C Entered By: Worthy Keeler on 02/17/2019 14:51:51

## 2019-02-24 ENCOUNTER — Other Ambulatory Visit: Payer: Self-pay

## 2019-02-24 ENCOUNTER — Ambulatory Visit (INDEPENDENT_AMBULATORY_CARE_PROVIDER_SITE_OTHER): Payer: Medicare Other | Admitting: Vascular Surgery

## 2019-02-24 ENCOUNTER — Encounter (INDEPENDENT_AMBULATORY_CARE_PROVIDER_SITE_OTHER): Payer: Self-pay | Admitting: Vascular Surgery

## 2019-02-24 ENCOUNTER — Ambulatory Visit (INDEPENDENT_AMBULATORY_CARE_PROVIDER_SITE_OTHER): Payer: Medicare Other

## 2019-02-24 VITALS — BP 130/80 | HR 78 | Resp 10 | Ht 67.0 in | Wt 227.1 lb

## 2019-02-24 DIAGNOSIS — Z992 Dependence on renal dialysis: Secondary | ICD-10-CM

## 2019-02-24 DIAGNOSIS — N186 End stage renal disease: Secondary | ICD-10-CM

## 2019-02-24 DIAGNOSIS — T879 Unspecified complications of amputation stump: Secondary | ICD-10-CM | POA: Diagnosis not present

## 2019-02-24 DIAGNOSIS — I739 Peripheral vascular disease, unspecified: Secondary | ICD-10-CM

## 2019-02-24 DIAGNOSIS — E1152 Type 2 diabetes mellitus with diabetic peripheral angiopathy with gangrene: Secondary | ICD-10-CM

## 2019-02-24 DIAGNOSIS — I255 Ischemic cardiomyopathy: Secondary | ICD-10-CM

## 2019-02-24 DIAGNOSIS — Z89611 Acquired absence of right leg above knee: Secondary | ICD-10-CM

## 2019-02-24 NOTE — Progress Notes (Signed)
MRN : [id number]  [name] is a 58 y.o. (1961/09/03) male who presents with chief complaint of  Chief Complaint  Patient presents with  . Follow-up  .  History of Present Illness: Patient returns today in follow up of his dialysis access at the request of the dialysis center.  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  He reports no major issues with his dialysis as far as he knows.  They have been rotating the access sites.  They are getting decent flows.  He is not having prolonged bleeding. Duplex today shows pretty good flow velocities with mild narrowing at the anastomosis but no significant changes from previous studies in years past.  Current Outpatient Medications  Medication Sig Dispense Refill  . amiodarone (PACERONE) 200 MG tablet Take 1 tablet (200 mg total) by mouth daily. 90 tablet 3  . aspirin EC 81 MG tablet Take 81 mg by mouth daily.    Marland Kitchen b complex-vitamin c-folic acid (NEPHRO-VITE) 0.8 MG TABS tablet Take 1 tablet by mouth daily.    . cinacalcet (SENSIPAR) 60 MG tablet Take 60 mg by mouth daily.     . ferrous sulfate 325 (65 FE) MG EC tablet Take 325 mg by mouth 3 (three) times daily with meals.    Marland Kitchen glipiZIDE (GLUCOTROL) 10 MG tablet Take 10 mg by mouth daily.     Marland Kitchen lactulose (CEPHULAC) 20 g packet Take 1 packet (20 g total) by mouth 3 (three) times daily. (Patient taking differently: Take 20 g by mouth daily as needed. ) 30 each 0  . lidocaine-prilocaine (EMLA) cream Apply 1 application topically as needed (port access).    . midodrine (PROAMATINE) 10 MG tablet Take 1 tablet (10 mg total) by mouth 3 (three) times daily with meals. 30 tablet 0  . Nutritional Supplements (FEEDING SUPPLEMENT, NEPRO CARB STEADY,) LIQD Take 237 mLs by mouth 2 (two) times daily between meals.  0  . omeprazole (PRILOSEC) 40 MG capsule Take 40 mg by mouth 2 (two) times  daily.     . ondansetron (ZOFRAN) 8 MG tablet Take 8 mg by mouth every 8 (eight) hours as needed.     Marland Kitchen oxyCODONE (OXY IR/ROXICODONE) 5 MG immediate release tablet Take 1 tablet (5 mg total) by mouth every 6 (six) hours as needed for moderate pain or severe pain. 20 tablet 0  . polyethylene glycol (MIRALAX / GLYCOLAX) packet Take 17 g by mouth 2 (two) times daily.     . sevelamer carbonate (RENVELA) 800 MG tablet Take 2,400 mg by mouth 3 (three) times daily with meals.     . Wound Dressings (AQUACEL EXTRA HYDROFIBER 6X6) PADS Apply 1 Package topically 3 (three) times a week. 1 each 3  . acetaminophen (TYLENOL) 325 MG tablet Take 650 mg by mouth every 6 (six) hours as needed.    Marland Kitchen albuterol (ACCUNEB) 0.63 MG/3ML nebulizer solution Take 3 mLs (0.63 mg total) by nebulization every 4 (four) hours as needed for wheezing. (Patient not taking: Reported on 12/18/2018) 200 mL 3  . cephALEXin (KEFLEX) 500 MG capsule Take 1 capsule (500 mg total) by mouth 2 (two) times daily. (Patient not taking: Reported on 02/24/2019) 28 capsule 0  . cephALEXin (KEFLEX) 500 MG capsule Take 1 capsule (500 mg total) by mouth 3 (three) times daily. (Patient not taking: Reported on 02/24/2019) 21  capsule 0  . gabapentin (NEURONTIN) 100 MG capsule Take 1 capsule (100 mg total) by mouth 3 (three) times daily. (Patient not taking: Reported on 02/24/2019) 90 capsule 0  . silver sulfADIAZINE (SILVADENE) 1 % cream Apply 1 application topically daily. (Patient not taking: Reported on 02/24/2019) 400 g 0   No current facility-administered medications for this visit.     Past Medical History:  Diagnosis Date  . (HFpEF) heart failure with preserved ejection fraction ([place])    a. 2017 EF per LHC 30-35%; b. 09/2018 Echo: EF 55-60%, no rwma, Gr2 DD, mild MR, mildly dil LA. PASP 35mmHg. small to mod circumferential pericardial eff, no tamponade.  . Anemia of chronic disease   . Atherosclerosis   . CAD (coronary artery disease)    a. 2017 LHC,  severe 1v disease, occluded pRCA, L-R collaterals, moderate LV dysfunction EF 30-35.  . Diabetes mellitus with complication ([place])   . End stage renal disease on dialysis ([place])    Labile BP  . History of ventricular tachycardia    a. 2017 polymorphic VT and cardiac arrest.  . Hypercholesteremia   . Hyperlipidemia LDL goal <70   . Hypertension   . Labile blood pressure    on HD  . NSVT (nonsustained ventricular tachycardia) ([place])    08/2018 NSVT  . PAF (paroxysmal atrial fibrillation) ([place])    a. 08/2018 CHA2DS2VASc at least 6-->initially placed on eliquis but d/c'd 2/2 anemia. On Amio.  Marland Kitchen Peripheral arterial occlusive disease (HCC)    s/p L BKA, R AKA  . PONV (postoperative nausea and vomiting)   . S/P AKA (above knee amputation) unilateral, right ([place])    08/2018  . S/P BKA (below knee amputation) unilateral, left ([place])    2015  . Stroke [facility])     Past Surgical History:  Procedure Laterality Date  . A/V FISTULAGRAM Left 01/09/2017   Procedure: A/V Fistulagram;  Surgeon: [name], MD;  Location: [facility];  Service: Cardiovascular;  Laterality: Left;  . A/V FISTULAGRAM Left 06/06/2017   Procedure: A/V Fistulagram;  Surgeon: [name], MD;  Location: [facility];  Service: Cardiovascular;  Laterality: Left;  . A/V FISTULAGRAM Left 08/15/2017   Procedure: A/V Fistulagram;  Surgeon: [name], MD;  Location: [place] [facility];  Service: Cardiovascular;  Laterality: Left;  . A/V FISTULAGRAM Left 01/22/2018   Procedure: A/V FISTULAGRAM;  Surgeon: [name], MD;  Location: [place] [facility];  Service: Cardiovascular;  Laterality: Left;  . A/V SHUNT INTERVENTION N/A 06/06/2017   Procedure: A/V SHUNT INTERVENTION;  Surgeon: [name], MD;  Location: [place] [facility];  Service: Cardiovascular;  Laterality: N/A;  . AMPUTATION Right 04/25/2016   Procedure: TOE AMPUTATION;  Surgeon: Katha Cabal, MD;  Location: [facility];  Service: Vascular;   Laterality: Right;  . AMPUTATION Right 09/09/2018   Procedure: AMPUTATION ABOVE KNEE;  Surgeon: [name], MD;  Location: [facility];  Service: Vascular;  Laterality: Right;  . APPLICATION OF WOUND VAC Right 04/25/2016   Procedure: APPLICATION OF WOUND VAC;  Surgeon: Katha Cabal, MD;  Location: [facility];  Service: Vascular;  Laterality: Right;  . APPLICATION OF WOUND VAC Right 12/18/2018   Procedure: APPLICATION OF WOUND VAC;  Surgeon: [name], MD;  Location: [facility];  Service: Vascular;  Laterality: Right;  wound vac  . AV FISTULA PLACEMENT Left 2015  . CARDIAC CATHETERIZATION N/A 04/30/2016   Procedure: Left Heart  Cath and Coronary Angiography;  Surgeon: Wellington Hampshire, MD;  Location: [facility];  Service: Cardiovascular;  Laterality: N/A;  . CATARACT EXTRACTION, BILATERAL    . LEG AMPUTATION BELOW KNEE Left   . LOWER EXTREMITY ANGIOGRAPHY Right 07/24/2018   Procedure: LOWER EXTREMITY ANGIOGRAPHY;  Surgeon: [name], MD;  Location: [place] [facility];  Service: Cardiovascular;  Laterality: Right;  . LOWER EXTREMITY ANGIOGRAPHY Right 08/13/2018   Procedure: LOWER EXTREMITY ANGIOGRAPHY;  Surgeon: [name], MD;  Location: [place] [facility];  Service: Cardiovascular;  Laterality: Right;  . LOWER EXTREMITY ANGIOGRAPHY Right 09/04/2018   Procedure: Lower Extremity Angiography;  Surgeon: [name], MD;  Location: [place] [facility];  Service: Cardiovascular;  Laterality: Right;  . PERIPHERAL VASCULAR CATHETERIZATION N/A 10/03/2015   Procedure: A/V Shuntogram/Fistulagram;  Surgeon: [name], MD;  Location: [facility];  Service: Cardiovascular;  Laterality: N/A;  . PERIPHERAL VASCULAR CATHETERIZATION N/A 01/16/2016   Procedure: Abdominal Aortogram w/Lower Extremity;  Surgeon: [name], MD;  Location: [facility];  Service: Cardiovascular;  Laterality: N/A;  . PERIPHERAL VASCULAR CATHETERIZATION  01/16/2016   Procedure: Lower Extremity  Intervention;  Surgeon: [name], MD;  Location: [facility];  Service: Cardiovascular;;  . PERIPHERAL VASCULAR CATHETERIZATION Right 04/27/2016   Procedure: Lower Extremity Angiography;  Surgeon: Katha Cabal, MD;  Location: [facility] [facility];  Service: Cardiovascular;  Laterality: Right;  . PERIPHERAL VASCULAR CATHETERIZATION  04/27/2016   Procedure: Lower Extremity Intervention;  Surgeon: Katha Cabal, MD;  Location: [place] [facility];  Service: Cardiovascular;;  . TRANSMETATARSAL AMPUTATION Right 04/28/2016   Procedure: TRANSMETATARSAL AMPUTATION;  Surgeon: Albertine Patricia, DPM;  Location: [facility];  Service: Podiatry;  Laterality: Right;  . WOUND DEBRIDEMENT Right 04/25/2016   Procedure: DEBRIDEMENT WOUND;  Surgeon: Katha Cabal, MD;  Location: [facility];  Service: Vascular;  Laterality: Right;  . WOUND DEBRIDEMENT Right 12/18/2018   Procedure: DEBRIDEMENT WOUND;  Surgeon: [name], MD;  Location: [facility];  Service: Vascular;  Laterality: Right;  right stump   Social History       Tobacco Use  . Smoking status: Never Smoker  . Smokeless tobacco: Never Used  Substance Use Topics  . Alcohol use: No  . Drug use: No  married  Family History      Family History  Problem Relation Age of Onset  . Diabetes Father   . Prostate cancer Father   no bleeding or clotting disorders  No Known Allergies   REVIEW OF SYSTEMS (Negative unless checked)  Constitutional: [] ?Weight loss  [] ?Fever  [] ?Chills Cardiac: [] ?Chest pain   [] ?Chest pressure   [] ?Palpitations   [] ?Shortness of breath when laying flat   [] ?Shortness of breath at rest   [] ?Shortness of breath with exertion. Vascular:  [] ?Pain in legs with walking   [x] ?Pain in legs at rest   [x] ?Pain in legs when laying flat   [] ?Claudication   [] ?Pain in feet when walking  [] ?Pain in feet at rest  [] ?Pain in feet when laying flat   [] ?History of DVT   [] ?Phlebitis   [] ?Swelling in legs   [] ?Varicose  veins   [x] ?Non-healing ulcers Pulmonary:   [] ?Uses home oxygen   [] ?Productive cough   [] ?Hemoptysis   [] ?Wheeze  [] ?COPD   [] ?Asthma Neurologic:  [] ?Dizziness  [] ?Blackouts   [] ?Seizures   [] ?History of stroke   [] ?History of TIA  [] ?Aphasia   [] ?Temporary blindness   [] ?  Dysphagia   [] ?Weakness or numbness in arms   [x] ?Weakness or numbness in legs Musculoskeletal:  [x] ?Arthritis   [] ?Joint swelling   [] ?Joint pain   [] ?Low back pain Hematologic:  [] ?Easy bruising  [] ?Easy bleeding   [] ?Hypercoagulable state   [] ?Anemic   Gastrointestinal:  [] ?Blood in stool   [] ?Vomiting blood  [] ?Gastroesophageal reflux/heartburn   [] ?Abdominal pain Genitourinary:  [x] ?Chronic kidney disease   [] ?Difficult urination  [] ?Frequent urination  [] ?Burning with urination   [] ?Hematuria Skin:  [] ?Rashes   [x] ?Ulcers   [x] ?Wounds Psychological:  [] ?History of anxiety   [] ? History of major depression.     Physical Examination  BP 130/80 (BP Location: Right Arm, Patient Position: Sitting, Cuff Size: Large)   Pulse 78   Resp 10   Ht 5\' 7"  (9.892 m)   Wt 227 lb 1.2 oz (103 kg)   BMI 35.56 kg/m  Gen:  WD/WN, NAD Head: [place]/AT, No temporalis wasting. Ear/Nose/Throat: Hearing grossly intact, nares w/o erythema or drainage Eyes: Conjunctiva clear. Sclera non-icteric Neck: Supple.  Trachea midline Pulmonary:  Good air movement, no use of accessory muscles.  Cardiac: RRR, no JVD Vascular: Good thrill in left radiocephalic AV fistula Vessel Right Left  Radial Palpable Palpable                       Musculoskeletal: M/S 5/5 throughout.  Left BKA, right AKA with right AKA wound dressed currently.  Uses a wheelchair. Neurologic: Sensation grossly intact in extremities.  Symmetrical.  Speech is fluent.  Psychiatric: Judgment intact, Mood & affect appropriate for pt's clinical situation. Dermatologic: Right BKA wound is present and dressed today.       Labs Recent Results (from the past 2160 hour(s))   APTT     Status: None   Collection Time: 12/12/18  3:24 PM  Result Value Ref Range   aPTT 31 24 - 36 seconds    Comment: Performed at [facility], [address] [place], [place] [address]  Basic metabolic panel     Status: Abnormal   Collection Time: 12/12/18  3:24 PM  Result Value Ref Range   Sodium 139 135 - 145 mmol/L   Potassium 3.9 3.5 - 5.1 mmol/L   Chloride 97 (L) 98 - 111 mmol/L   CO2 27 22 - 32 mmol/L   Glucose, Bld 63 (L) 70 - 99 mg/dL   BUN 45 (H) 6 - 20 mg/dL   Creatinine, Ser 5.74 (H) 0.61 - 1.24 mg/dL   Calcium 8.4 (L) 8.9 - 10.3 mg/dL   GFR calc non Af Amer 10 (L) >60 mL/min   GFR calc Af Amer 11 (L) >60 mL/min   Anion gap 15 5 - 15    Comment: Performed at [facility], [address] [place], [place] [address]  CBC WITH DIFFERENTIAL     Status: Abnormal   Collection Time: 12/12/18  3:24 PM  Result Value Ref Range   WBC 10.9 (H) 4.0 - 10.5 K/uL   RBC 5.12 4.22 - 5.81 MIL/uL   Hemoglobin 14.8 13.0 - 17.0 g/dL   HCT 49.1 39.0 - 52.0 %   MCV 95.9 80.0 - 100.0 fL   MCH 28.9 26.0 - 34.0 pg   MCHC 30.1 30.0 - 36.0 g/dL   RDW 17.3 (H) 11.5 - 15.5 %   Platelets 346 150 - 400 K/uL   nRBC 0.0 0.0 - 0.2 %   Neutrophils Relative % 57 %   Neutro Abs 6.3  1.7 - 7.7 K/uL   Lymphocytes Relative 22 %   Lymphs Abs 2.4 0.7 - 4.0 K/uL   Monocytes Relative 11 %   Monocytes Absolute 1.2 (H) 0.1 - 1.0 K/uL   Eosinophils Relative 8 %   Eosinophils Absolute 0.9 (H) 0.0 - 0.5 K/uL   Basophils Relative 1 %   Basophils Absolute 0.1 0.0 - 0.1 K/uL   Immature Granulocytes 1 %   Abs Immature Granulocytes 0.05 0.00 - 0.07 K/uL    Comment: Performed at [facility], [address] [place], [place] [address]  Protime-INR     Status: None   Collection Time: 12/12/18  3:24 PM  Result Value Ref Range   Prothrombin Time 13.0 11.4 - 15.2 seconds   INR 1.0 0.8 - 1.2    Comment: (NOTE) INR goal varies based on device and disease states. Performed at  [facility], [address] [place], [place] [address]   Type and screen     Status: None   Collection Time: 12/12/18  3:24 PM  Result Value Ref Range   ABO/RH(D) O POS    Antibody Screen NEG    Sample Expiration 12/15/2018    Extend sample reason      TRANSFUSED IN PAST 3 MONTHS, UNABLE TO EXTEND Performed at [facility], [address] [place], [place] [address]   Type and screen [place]     Status: None   Collection Time: 12/18/18  1:38 PM  Result Value Ref Range   ABO/RH(D) O POS    Antibody Screen NEG    Sample Expiration      12/21/2018 Performed at [facility], [address], [place],  [address]   I-STAT 4, (NA,K, GLUC, HGB,HCT)     Status: Abnormal   Collection Time: 12/18/18  1:44 PM  Result Value Ref Range   Sodium 133 (L) 135 - 145 mmol/L   Potassium 5.5 (H) 3.5 - 5.1 mmol/L   Glucose, Bld 116 (H) 70 - 99 mg/dL   HCT 51.0 39.0 - 52.0 %   Hemoglobin 17.3 (H) 13.0 - 17.0 g/dL  Glucose, capillary     Status: Abnormal   Collection Time: 12/18/18  4:02 PM  Result Value Ref Range   Glucose-Capillary 108 (H) 70 - 99 mg/dL    Radiology No results found.  Assessment/Plan ESRD on dialysis [facility]) The patient has been on dialysis now for about a decade and has used this AV fistula in his left arm.  Duplex today shows pretty good flow velocities with mild narrowing at the anastomosis but no significant changes from previous studies in years past.  No prolonged bleeding or other issues.  Would continue to use this AV fistula for his dialysis without reservation.  We will not plan a follow-up visit unless they have issues at dialysis.  Diabetes ([place]) blood glucose control important in reducing the progression of atherosclerotic disease. Also, involved in wound healing. On appropriate medications.   PVD (peripheral vascular disease) ([place]) Now status post bilateral amputations  Complication of above knee  amputation stump [facility]) Now being followed by the [place], MD  02/24/2019 8:52 AM    This note was created with Dragon medical transcription system.  Any errors from dictation are purely unintentional

## 2019-02-24 NOTE — Assessment & Plan Note (Signed)
Now being followed by the [facility] [place]

## 2019-03-03 ENCOUNTER — Ambulatory Visit (INDEPENDENT_AMBULATORY_CARE_PROVIDER_SITE_OTHER): Payer: Medicare Other | Admitting: Vascular Surgery

## 2019-03-03 ENCOUNTER — Other Ambulatory Visit: Payer: Self-pay

## 2019-03-03 ENCOUNTER — Encounter: Payer: Medicare Other | Admitting: Physician Assistant

## 2019-03-03 DIAGNOSIS — T8789 Other complications of amputation stump: Secondary | ICD-10-CM | POA: Diagnosis not present

## 2019-03-04 NOTE — Progress Notes (Signed)
CHESNEY, KLIMASZEWSKI ([id number]) Visit Report for 03/03/2019 Chief Complaint Document Details Patient Name: [name], [name] Date of Service: 03/03/2019 8:15 AM Medical Record Number: [id number] Patient Account Number: [id number] Date of Birth/Sex: 28-May-1961 (58 y.o. M) Treating RN: Harold Barban Primary Care Provider: [name] Other Clinician: Referring Provider: [name] Treating Provider/Extender: [name], Nasiir Monts Weeks in Treatment: 5 Information Obtained from: Patient Chief Complaint Right AKA Ulcer at surgical incision Electronic Signature(s) Signed: 03/03/2019 11:00:46 PM By: Worthy Keeler PA-C Entered By: Worthy Keeler on 03/03/2019 08:24:49 [name] ([id number]) -------------------------------------------------------------------------------- Debridement Details Patient Name: [name], [name] Date of Service: 03/03/2019 8:15 AM Medical Record Number: [id number] Patient Account Number: [id number] Date of Birth/Sex: [birth year] (58 y.o. M) Treating RN: Harold Barban Primary Care Provider: [name] Other Clinician: Referring Provider: [name] Treating Provider/Extender: [name], Vedha Tercero Weeks in Treatment: 5 Debridement Performed for Wound #1 Right Amputation Site - Above Knee Assessment: Performed By: Physician STONE III, Adarian Bur E., PA-C Debridement Type: Debridement Severity of Tissue Pre Fat layer exposed Debridement: Level of Consciousness (Pre- Awake and Alert procedure): Pre-procedure Verification/Time Yes - 08:40 Out Taken: Start Time: 08:40 Pain Control: Lidocaine Total Area Debrided (L x W): 3.5 (cm) x 5.5 (cm) = 19.25 (cm) Tissue and other material Viable, Non-Viable, Slough, Subcutaneous, Slough debrided: Level: Skin/Subcutaneous Tissue Debridement Description: Excisional Instrument: Curette Bleeding: Moderate Hemostasis Achieved: Pressure End Time: 08:43 Procedural Pain: 6 Post Procedural Pain: 6 Response to Treatment:  Procedure was tolerated well Level of Consciousness Awake and Alert (Post-procedure): Post Debridement Measurements of Total Wound Length: (cm) 3.5 Width: (cm) 5.5 Depth: (cm) 0.2 Volume: (cm) 3.024 Character of Wound/Ulcer Post Debridement: Improved Severity of Tissue Post Debridement: Fat layer exposed Post Procedure Diagnosis Same as Pre-procedure Electronic Signature(s) Signed: 03/03/2019 4:39:00 PM By: Harold Barban Signed: 03/03/2019 11:00:46 PM By: Worthy Keeler PA-C Entered By: Harold Barban on 03/03/2019 08:42:21 [name], [name] ([id number]) -------------------------------------------------------------------------------- HPI Details Patient Name: [name], [name] Date of Service: 03/03/2019 8:15 AM Medical Record Number: [id number] Patient Account Number: [id number] Date of Birth/Sex: 1960/10/16 (58 y.o. M) Treating RN: Harold Barban Primary Care Provider: [name] Other Clinician: Referring Provider: [name] Treating Provider/Extender: [name], Hanifah Royse Weeks in Treatment: 5 History of Present Illness HPI Description: 01/27/19 patient presents today for initial evaluation here in our clinic concerning an issue he's been having with a wound at the right above knee amputation site which began shortly following the surgery in November 2019. 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 He has a history of hypertension, diabetes, congestive heart failure, and states that he is having a lot of discomfort at the wound site unfortunately. There does not appear to be any signs of active infection upon initial inspection today. No fevers, chills, nausea, or vomiting noted at this time.. Silvadene Cream has been utilized at some point on this wound as well. Patient did have home  health coming out but unfortunately the patient and his wife state that they were not happy with the services being rendered and therefore discontinued the service. There does not appear to be any signs of otherwise active infection at this time which is good news. The patient states he's definitely ready for this to heal and has been a symptomatic time since his surgery and he  is becoming somewhat frustrated. Iodoflex also sounds to have previously been used on the wound that is when the patient's wife states the wound seem to get worse not better. As best I can tell [name] has not been utilized. 02/03/19 on evaluation today patient appears to be doing much better compared to last week's evaluation in regard to his ulcer on the right amputation site. He's been tolerating the dressing changes without complication which is good news. The Santyl does seem to be of benefit in fact the wound appears to be doing significantly better compared to my prior evaluation. Overall very pleased in this regard. No fevers, chills, nausea, or vomiting noted at this time. 02/10/19 on evaluation today patient's wound actually appears to be showing signs of excellent improvement at this time which is great news. He has been tolerating the dressing changes without complication overall I feel like that there is no signs of active infection at this point which is also good news. I feel like that the [name] is very beneficial. Patient's wife likewise feels like things are doing quite well and is not having pain like he was in the past. 02/17/19 on evaluation today patient appears to be doing well in regard to the wound on the distal aspect of his right amputation site. He's been tolerating treatment with the Santyl at this point. I think he is nearing completion as far as treatment with cental is concerned I think were very close to getting this significantly better. Fortunately there's no evidence of infection  currently. 03/03/19 on evaluation today patient actually appears to be doing quite well in regard to his his right imputation site ulceration. In fact there's a lot of new skin growth which is excellent news. I'm very pleased in this regard. Fortunately there's no signs of active infection also good news. No fevers, chills, nausea, or vomiting noted at this time. Electronic Signature(s) Signed: 03/03/2019 11:00:46 PM By: Worthy Keeler PA-C Entered By: Worthy Keeler on 03/03/2019 09:13:47 [name] ([id number]) -------------------------------------------------------------------------------- Physical Exam Details Patient Name: [name], [name] Date of Service: 03/03/2019 8:15 AM Medical Record Number: [id number] Patient Account Number: [id number] Date of Birth/Sex: January 16, 1961 (58 y.o. M) Treating RN: Harold Barban Primary Care Provider: [name] Other Clinician: Referring Provider: [name] Treating Provider/Extender: STONE III, Oral Remache Weeks in Treatment: 5 Constitutional Well-nourished and well-hydrated in no acute distress. Respiratory normal breathing without difficulty. Psychiatric this patient is able to make decisions and demonstrates good insight into disease process. Alert and Oriented x 3. pleasant and cooperative. Notes Patient's wound bed actually showed signs of some Slough noted on the surface of the wound but for the most part it was excellent granulation with a lot of new skin growth needed as well. Overall I'm very pleased in this regard. Sharp debridement was performed to remove the slough from the surface of the wound which he tolerated with some discomfort. Fortunately there was not a tremendous amount of discomfort however. Electronic Signature(s) Signed: 03/03/2019 11:00:46 PM By: Worthy Keeler PA-C Entered By: Worthy Keeler on 03/03/2019 09:17:56 [name]  ([id number]) -------------------------------------------------------------------------------- Physician Orders Details Patient Name: Kristopher Haggard L. Date of Service: 03/03/2019 8:15 AM Medical Record Number: [id number] Patient Account Number: [id number] Date of Birth/Sex: 04/26/1961 (58 y.o. 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 Anesthetic (add to Medication List) Wound #1 Right Amputation Site - Above Knee o Topical Lidocaine 4% cream applied to wound bed prior to debridement (In Clinic Only). Primary Wound Dressing Wound #1 Right Amputation Site - Above Knee o Silver Collagen - Moisten with saline Secondary Dressing Wound #1 Right Amputation Site - Above Knee o ABD pad - secure with tape o Saline moistened gauze Dressing Change Frequency Wound #1 Right Amputation Site - Above Knee o Change dressing every other day. Follow-up Appointments Wound #1 Right Amputation Site - Above Knee o Return Appointment in 2 weeks. Electronic Signature(s) Signed: 03/03/2019 4:39:00 PM By: Harold Barban Signed: 03/03/2019 11:00:46 PM By: Worthy Keeler PA-C Entered By: Harold Barban on 03/03/2019  08:47:29 [name] ([id number]) -------------------------------------------------------------------------------- Problem List Details Patient Name: [name], [name] Date of Service: 03/03/2019 8:15 AM Medical Record Number: [id number] Patient Account Number: [id number] Date of Birth/Sex: Sep 12, 1961 (58 y.o. 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 01/27/2019 No Yes E11.622 Type 2 diabetes mellitus with other skin ulcer 01/27/2019 No Yes I50.42 Chronic combined systolic (congestive) and diastolic 4/85/4627 No Yes (congestive) heart failure N18.6 End stage renal disease 01/27/2019 No Yes Z99.2 Dependence on renal dialysis 01/27/2019 No Yes I10 Essential (primary) hypertension 01/27/2019 No Yes I48.0 Paroxysmal atrial fibrillation 01/27/2019 No Yes Inactive Problems Resolved Problems Electronic Signature(s) Signed: 03/03/2019 11:00:46 PM By: Worthy Keeler PA-C Entered By: Worthy Keeler on 03/03/2019 08:24:45 [name] ([id number]) -------------------------------------------------------------------------------- Progress Note Details Patient Name: [name], [name] Date of Service: 03/03/2019 8:15 AM Medical Record Number: [id number] Patient Account Number: [id number] Date of Birth/Sex: [birth year] (58 y.o. M) Treating RN: Harold Barban Primary Care Provider: [name] Other Clinician: Referring Provider: [name] Treating Provider/Extender: [name], Dominiq Fontaine Weeks in Treatment: 5 Subjective Chief  Complaint Information obtained from Patient Right AKA Ulcer at surgical incision History of Present Illness (HPI) 01/27/19 patient presents today for initial evaluation here in our clinic concerning an issue he's been having with a wound at the right above knee amputation site which began shortly following the surgery in November 2019. 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 He has a history of hypertension, diabetes, congestive heart failure, and states that he is having a lot of discomfort at the wound site unfortunately. There does not appear to be any signs of active infection upon initial inspection today. No fevers, chills, nausea, or vomiting noted at this time.. Silvadene Cream has been utilized  at some point on this wound as well. Patient did have home health coming out but unfortunately the patient and his wife state that they were not happy with the services being rendered and therefore discontinued the service. There does not appear to be any signs of otherwise active infection at this time which is good news. The patient states he's definitely ready for this to heal and has been a symptomatic time since his surgery and he is becoming somewhat frustrated. Iodoflex also sounds to have previously been used on the wound that is when the patient's wife states the wound seem to get worse not better. As best I can tell [name] has not been utilized. 02/03/19 on evaluation today patient appears to be doing much better compared to last week's evaluation in regard to his ulcer on the right amputation site. He's been tolerating the dressing changes without complication which is good news. The Santyl does seem to be of benefit in fact the wound appears to be doing significantly better compared to my prior  evaluation. Overall very pleased in this regard. No fevers, chills, nausea, or vomiting noted at this time. 02/10/19 on evaluation today patient's wound actually appears to be showing signs of excellent improvement at this time which is great news. He has been tolerating the dressing changes without complication overall I feel like that there is no signs of active infection at this point which is also good news. I feel like that the [name] is very beneficial. Patient's wife likewise feels like things are doing quite well and is not having pain like he was in the past. 02/17/19 on evaluation today patient appears to be doing well in regard to the wound on the distal aspect of his right amputation site. He's been tolerating treatment with the Santyl at this point. I think he is nearing completion as far as treatment with cental is concerned I think were very close to getting this significantly better. Fortunately there's no evidence of infection currently. 03/03/19 on evaluation today patient actually appears to be doing quite well in regard to his his right imputation site ulceration. In fact there's a lot of new skin growth which is excellent news. I'm very pleased in this regard. Fortunately there's no signs of active infection also good news. No fevers, chills, nausea, or vomiting noted at this time. Patient History Information obtained from Patient. Family History Cancer - Father, Diabetes - Siblings, No family history of Hereditary Spherocytosis. Social History Former smoker - ended on 10/16/1994, Marital Status - Married, Alcohol Use - Never, Drug Use - No History, Caffeine Use - Rarely. Medical History Cardiovascular Patient has history of Arrhythmia - A-fib, Congestive Heart Failure, Hypertension, Peripheral Arterial Disease - Bilateral LE amputations Endocrine Patient has history of Type II Diabetes Genitourinary [name], [name] ([id number]) Patient has history of End Stage Renal Disease  - Dialysis M,W,F Hospitalization/Surgery History - Right AKA. - Left BKA. 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 Respiratory Denies complaints or symptoms of Chronic or frequent coughs, Shortness of Breath. Cardiovascular Denies complaints or symptoms of Chest pain, LE edema. Psychiatric Denies complaints or symptoms of Anxiety, Claustrophobia. Objective Constitutional Well-nourished and well-hydrated in no acute distress. Vitals Time Taken: 8:16 AM, Height: 67 in, Weight: 220 lbs, BMI: 34.5, Temperature: 97.7 F, Pulse: 77  bpm, Respiratory Rate: 16 breaths/min, Blood Pressure: 100/60 mmHg. Respiratory normal breathing without difficulty. Psychiatric this patient is able to make decisions and demonstrates good insight into disease process. Alert and Oriented x 3. pleasant and cooperative. General Notes: Patient's wound bed actually showed signs of some Slough noted on the surface of the wound but for the most part it was excellent granulation with a lot of new skin growth needed as well. Overall I'm very pleased in this regard. Sharp debridement was performed to remove the slough from the surface of the wound which he tolerated with some discomfort. Fortunately there was not a tremendous amount of discomfort however. Integumentary (Hair, Skin) Wound #1 status is Open. Original cause of wound was Surgical Injury. The wound is located on the Right Amputation Site - Above Knee. The wound measures 3.5cm length x 5.5cm width x 0.2cm depth; 15.119cm^2 area and 3.024cm^3 volume. There is Fat Layer (Subcutaneous Tissue) Exposed exposed. There is no tunneling or undermining noted. There is a medium amount of serosanguineous drainage noted.  The wound margin is thickened. There is medium (34-66%) red, hyper - granulation within the wound bed. There is a medium (34-66%) amount of necrotic tissue within the wound bed including Adherent Slough. 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 There was a Excisional Skin/Subcutaneous Tissue Debridement with a total area of 19.25 sq cm performed by STONE III, Levorn Oleski E., PA-C. With the following instrument(s): Curette to remove Viable and Non-Viable tissue/material. Material removed includes Subcutaneous Tissue and Slough and after achieving pain control using Lidocaine. No specimens were taken. A time out was conducted at 08:40, prior to the start of the procedure. A Moderate amount of bleeding was controlled with Pressure. The procedure was tolerated well with a pain level of 6 throughout and a pain level of 6 following the procedure. Post Debridement Measurements: 3.5cm length x 5.5cm width x 0.2cm depth; 3.024cm^3 volume. Character of Wound/Ulcer Post Debridement is improved. Severity of Tissue Post Debridement is: Fat layer exposed. Post procedure Diagnosis Wound #1: Same as Pre-Procedure Plan Wound Cleansing: Wound #1 Right Amputation Site - Above Knee: Clean wound with Normal Saline. Anesthetic (add to Medication List): Wound #1 Right Amputation Site - Above  Knee: Topical Lidocaine 4% cream applied to wound bed prior to debridement (In Clinic Only). Primary Wound Dressing: Wound #1 Right Amputation Site - Above Knee: Silver Collagen - Moisten with saline Secondary Dressing: Wound #1 Right Amputation Site - Above Knee: ABD pad - secure with tape Saline moistened gauze Dressing Change Frequency: Wound #1 Right Amputation Site - Above Knee: Change dressing every other day. Follow-up Appointments: Wound #1 Right Amputation Site - Above Knee: Return Appointment in 2 weeks. My suggestion at this point is gonna be that we go ahead and continue with the above wound care measures for the time being. The patient is in agreement with that plan. We will subsequently see were things stand at follow-up. If anything changes or worsens in the meantime  patient will contact the office and let me know. Please see above for specific wound care orders. We will see patient for re-evaluation in 2 week(s) here in the clinic. If anything worsens or changes patient will contact our office for additional recommendations. Electronic Signature(s) Signed: 03/03/2019 11:00:46 PM By: Clovis Pu, Laquinn Carlean Jews ([id number]) Entered By: Worthy Keeler on 03/03/2019 14:55:19 [name] ([id number]) -------------------------------------------------------------------------------- ROS/PFSH Details Patient Name: [name], [name] Date of Service: 03/03/2019 8:15 AM Medical Record Number: [id number] Patient Account Number: [id number] Date of Birth/Sex: 01-15-1961 (58 y.o. 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 Respiratory Complaints and Symptoms: Negative for: Chronic or frequent coughs; Shortness of Breath Cardiovascular Complaints and Symptoms: Negative for: Chest pain; LE edema Medical History: Positive for: Arrhythmia - A-fib; Congestive Heart Failure; Hypertension; Peripheral Arterial Disease - Bilateral LE amputations Psychiatric Complaints and Symptoms: Negative for: Anxiety; Claustrophobia Endocrine Medical History: Positive for: Type II Diabetes Time with diabetes: 14 years Treated with: Oral agents Blood sugar tested every day: No Blood sugar testing results: Breakfast: 128; Lunch: 145 Genitourinary Medical History: Positive for: End Stage Renal Disease - Dialysis M,W,F Immunizations Pneumococcal Vaccine: Received Pneumococcal Vaccination: Yes Implantable Devices None Hospitalization / Surgery History Type of Hospitalization/Surgery [name], [facility]. ([id number]) Right AKA Left BKA Family and Social History Cancer: Yes - Father; Diabetes: Yes - Siblings; Hereditary Spherocytosis: No; Former smoker - ended on 10/16/1994; Marital Status - Married; Alcohol Use: Never; Drug Use: No History; Caffeine Use: Rarely Physician Affirmation I have reviewed and agree with the above information. Electronic Signature(s) Signed: 03/03/2019 4:39:00 PM By: Harold Barban Signed: 03/03/2019 11:00:46 PM By: Worthy Keeler PA-C Entered By: Worthy Keeler on 03/03/2019 09:14:03 [name] ([id number]) -------------------------------------------------------------------------------- SuperBill Details Patient Name: [name], Trenden L. Date of Service: 03/03/2019 Medical Record Number: [id number] Patient Account Number: [id number] Date of Birth/Sex: [birth year] (58 y.o. 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.6 End stage renal disease Z99.2 Dependence on renal dialysis I10 Essential (primary) hypertension I48.0 Paroxysmal atrial fibrillation Facility Procedures CPT4 Code Description: [id number] 11042 - DEB SUBQ TISSUE 20 SQ CM/< ICD-10 Diagnosis Description L97.812 Non-pressure chronic ulcer of other part of right lower leg wi Modifier: th fat layer expo Quantity: 1 sed Physician Procedures CPT4 Code Description: 3557322 [id number] - WC PHYS SUBQ TISS 20 SQ CM ICD-10 Diagnosis Description 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

## 2019-03-04 NOTE — Progress Notes (Signed)
ZEVIN, NEVARES ([id number]) Visit Report for 03/03/2019 Arrival Information Details Patient Name: [name], [name] Date of Service: 03/03/2019 8:15 AM Medical Record Number: [id number] Patient Account Number: [id number] Date of Birth/Sex: [birth year] (58 y.o. M) Treating RN: Cornell Barman Primary Care Jahbari Repinski: [name] Other Clinician: Referring Xavion Muscat: [name] Treating Easton Fetty/Extender: [name], HOYT Weeks in Treatment: 5 Visit Information History Since Last Visit Added or deleted any medications: No Patient Arrived: Wheel Chair Any new allergies or adverse reactions: No Arrival Time: 08:15 Had a fall or experienced change in No Accompanied By: wife and activities of daily living that may affect interpreter risk of falls: Transfer Assistance: None Signs or symptoms of abuse/neglect since last visito No Patient Identification Verified: Yes Hospitalized since last visit: No Secondary Verification Process Yes Implantable device outside of the clinic excluding No Completed: cellular tissue based products placed in the center Patient Has Alerts: Yes since last visit: Patient Alerts: Type II Diabetic Pain Present Now: No Aspirin 81mg  Notes Patient to remain in wheelchair for the visit. Electronic Signature(s) Signed: 03/03/2019 4:47:44 PM By: Gretta Cool, BSN, RN, CWS, Kim RN, BSN Entered By: Gretta Cool, BSN, RN, CWS, Kim on 03/03/2019 08:15:59 [name] ([id number]) -------------------------------------------------------------------------------- Lower Extremity Assessment Details Patient Name: [name], [name] Date of Service: 03/03/2019 8:15 AM Medical Record Number: [id number] Patient Account Number: [id number] Date of Birth/Sex: 05-Dec-1960 (58 y.o. 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 Date of Service: 03/03/2019 8:15 AM Medical Record Number: [id number] Patient Account Number: [id number] Date of Birth/Sex: [birth year] (58 y.o. M) Treating RN: Harold Barban Primary Care Kelisha Dall: [name] Other Clinician: Referring Koral Thaden: [name] Treating Annelisa Ryback/Extender: STONE III, HOYT Weeks in Treatment: 5 Vital Signs Height(in): 67 Pulse(bpm): 47 Weight(lbs): 220 Blood Pressure(mmHg): 100/60 Body Mass Index(BMI): 34 Temperature(F): 97.7 Respiratory Rate 16 (breaths/min): Photos: [N/A:N/A] Wound Location: Right Amputation Site - Above N/A N/A Knee Wounding Event: Surgical Injury N/A N/A Primary Etiology: Arterial Insufficiency Ulcer N/A N/A Secondary Etiology: Diabetic Wound/Ulcer of the N/A N/A Lower Extremity Comorbid History: Arrhythmia, Congestive Heart N/A N/A Failure, Hypertension, Peripheral Arterial Disease, Type II Diabetes, End Stage Renal Disease Date Acquired: 09/09/2018 N/A N/A Weeks of Treatment: 5 N/A N/A Wound Status: Open N/A N/A Measurements L x W x D 3.5x5.5x0.2 N/A N/A (cm) Area (cm) : 15.119 N/A N/A Volume (cm) : 3.024 N/A N/A % Reduction in Area: 1.30% N/A N/A % Reduction in Volume: 67.10% N/A N/A Classification: Full Thickness Without N/A N/A Exposed Support Structures Exudate Amount: Large N/A N/A Exudate Type: Serosanguineous N/A N/A Exudate Color: red, brown N/A N/A Wound Margin: Thickened N/A N/A Granulation Amount: Medium (34-66%) N/A N/A Granulation Quality: Red, Hyper-granulation N/A N/A Necrotic Amount: Medium (34-66%) N/A N/A Exposed Structures: Fat Layer (Subcutaneous N/A N/A Tissue)  Exposed: Yes Fascia: No Tendon: No Muscle: No Joint: No Bone: No Epithelialization: Small (1-33%) N/A N/A ANDERS, HOHMANN ([id number]) Treatment Notes Electronic Signature(s) Signed: 03/03/2019 4:39:00 PM By: Harold Barban Entered By: Harold Barban on 03/03/2019 08:37:22 [name] ([id number]) -------------------------------------------------------------------------------- [place] Details Patient Name: Kristopher Haggard L. Date of Service: 03/03/2019 8:15 AM Medical Record  Number: [id number] Patient Account Number: [id number] Date of Birth/Sex: 1960-10-27 (57 y.o. M) Treating RN: Harold Barban Primary Care Zauria Dombek: [name] Other Clinician: Referring Davidjames Blansett: [name] Treating Darothy Courtright/Extender: [name], HOYT Weeks in Treatment: 5 Active Inactive Abuse / Safety / Falls / Self Care Management Nursing Diagnoses: Impaired physical mobility Goals: Patient will not develop complications from immobility Date Initiated: 01/27/2019 Target Resolution Date: 04/25/2019 Goal Status: Active Interventions: Assess fall risk on admission and as needed Notes: Orientation to the Wound Care Program Nursing Diagnoses: Knowledge deficit related to the wound healing center program Goals: Patient/caregiver will verbalize understanding of the [place] [facility] Date Initiated: 01/27/2019 Target Resolution Date: 04/25/2019 Goal Status: Active Interventions: Provide education on orientation to the wound center Notes: Pain, Acute or Chronic Nursing Diagnoses: Pain, acute or chronic: actual or potential Goals: Patient will verbalize adequate pain control and receive pain control interventions during procedures as needed Date Initiated: 01/27/2019 Target Resolution Date: 04/25/2019 Goal Status: Active Interventions: Complete pain assessment as per visit requirements Notes: Wound/Skin Impairment Nursing Diagnoses: Impaired tissue  integrity Trigg, Blaine ([id number]) Goals: Ulcer/skin breakdown will heal within 14 weeks Date Initiated: 01/27/2019 Target Resolution Date: 04/25/2019 Goal Status: Active Interventions: Assess patient/caregiver ability to obtain necessary supplies Assess patient/caregiver ability to perform ulcer/skin care regimen upon admission and as needed Assess ulceration(s) every visit Notes: Electronic Signature(s) Signed: 03/03/2019 4:39:00 PM By: Harold Barban Entered By: Harold Barban on 03/03/2019 08:37:02 [name] ([id number]) -------------------------------------------------------------------------------- Pain Assessment Details Patient Name: Kristopher Rich, Arman L. Date of Service: 03/03/2019 8:15 AM Medical Record Number: [id number] Patient Account Number: [id number] Date of Birth/Sex: March 27, 1961 (58 y.o. M) Treating RN: Cornell Barman Primary Care Bronsen Serano: [name] Other Clinician: Referring Tassie Pollett: [name] Treating Kelton Bultman/Extender: [name], HOYT Weeks in Treatment: 5 Active Problems Location of Pain Severity and Description of Pain Patient Has Paino Yes Site Locations Pain Location: Pain in Ulcers Rate the pain. Current Pain Level: 3 Worst Pain Level: 7 Character of Pain Describe the Pain: Sharp, Other: Biting pain Pain Management and Medication Current Pain Management: How does your wound impact your activities of daily livingo Sleep: Yes Electronic Signature(s) Signed: 03/03/2019 4:47:44 PM By: Gretta Cool, BSN, RN, CWS, Kim RN, BSN Entered By: Gretta Cool, BSN, RN, CWS, Kim on 03/03/2019 08:23:14 [name] ([id number]) -------------------------------------------------------------------------------- Patient/Caregiver Education Details Patient Name: Kristopher Rich, Connery L. Date of Service: 03/03/2019 8:15 AM Medical Record Number: [id number] Patient Account Number: [id number] Date of Birth/Gender: 17-Oct-1960 (58 y.o. M) Treating RN: Harold Barban Primary Care  Physician: [name] Other Clinician: Referring Physician: [name] Treating Physician/Extender: Sharalyn Ink in Treatment: 5 Education Assessment Education Provided To: Patient Education Topics Provided Wound/Skin Impairment: Handouts: Caring for Your Ulcer Methods: Demonstration, Explain/Verbal Responses: State content correctly Electronic Signature(s) Signed: 03/03/2019 4:39:00 PM By: Harold Barban Entered By: Harold Barban on 03/03/2019 08:37:39 Schafer, [name] ([id number]) -------------------------------------------------------------------------------- Wound Assessment Details Patient Name: Sumler, Oreste L. Date of Service: 03/03/2019 8:15 AM Medical Record Number: [id number] Patient Account Number: [id number] Date of Birth/Sex: 1961/05/21 (58 y.o. M) Treating RN: Cornell Barman Primary Care Uriah Trueba: [name] Other Clinician: Referring Aaronjames Kelsay: [name] Treating Tashawn Laswell/Extender: STONE III, HOYT Weeks in Treatment: 5 Wound Status Wound Number: 1 Primary Arterial Insufficiency Ulcer Etiology: Wound Location: Right Amputation Site - Above Knee Secondary Diabetic Wound/Ulcer of the Lower Extremity Wounding Event: Surgical Injury Etiology: Date Acquired: 09/09/2018 Wound Open Weeks Of Treatment: 5 Status: Clustered Wound: No Comorbid Arrhythmia, Congestive Heart Failure, History: Hypertension, Peripheral Arterial  Disease, Type II Diabetes, End Stage Renal Disease Photos Wound Measurements Length: (cm) 3.5 Width: (cm) 5.5 Depth: (cm) 0.2 Area: (cm) 15.119 Volume: (cm) 3.024 % Reduction in Area: 1.3% % Reduction in Volume: 67.1% Epithelialization: Small (1-33%) Tunneling: No Undermining: No Wound Description Full Thickness Without Exposed Support Classification: Structures Wound Margin: Thickened Exudate Medium Amount: Exudate Type: Serosanguineous Exudate Color: red, brown Foul Odor After Cleansing: No Slough/Fibrino  Yes Wound Bed Granulation Amount: Medium (34-66%) Exposed Structure Granulation Quality: Red, Hyper-granulation Fascia Exposed: No Necrotic Amount: Medium (34-66%) Fat Layer (Subcutaneous Tissue) Exposed: Yes Necrotic Quality: Adherent Slough Tendon Exposed: No Muscle Exposed: No Joint Exposed: No Bone Exposed: No Electronic Signature(s) Signed: 03/03/2019 2:47:54 PM By: Harold Barban Signed: 03/03/2019 4:47:44 PM By: Gretta Cool, BSN, RN, CWS, Kim RN, BSN Previous Signature: 03/03/2019 2:33:29 PM Version By: Harold Barban Previous Signature: 03/03/2019 9:01:56 AM Version By: Laqueta Jean, [name] ([id number]) Entered By: Harold Barban on 03/03/2019 14:47:54 Whetstine, [name] ([id number]) -------------------------------------------------------------------------------- Vitals Details Patient Name: Werling, Balthazar L. Date of Service: 03/03/2019 8:15 AM Medical Record Number: [id number] Patient Account Number: [id number] Date of Birth/Sex: [birth year] (58 y.o. M) Treating RN: Cornell Barman Primary Care Tedi Hughson: [name] Other Clinician: Referring Freddie Nghiem: [name] Treating Akasia Ahmad/Extender: [name], HOYT Weeks in Treatment: 5 Vital Signs Time Taken: 08:16 Temperature (F): 97.7 Height (in): 67 Pulse (bpm): 77 Weight (lbs): 220 Respiratory Rate (breaths/min): 16 Body Mass Index (BMI): 34.5 Blood Pressure (mmHg): 100/60 Reference Range: 80 - 120 mg / dl Electronic Signature(s) Signed: 03/03/2019 4:47:44 PM By: Gretta Cool, BSN, RN, CWS, Kim RN, BSN Entered By: Gretta Cool, BSN, RN, CWS, Kim on 03/03/2019 08:17:11

## 2019-03-17 ENCOUNTER — Encounter: Payer: Medicare Other | Attending: Physician Assistant | Admitting: Physician Assistant

## 2019-03-17 DIAGNOSIS — Z89611 Acquired absence of right leg above knee: Secondary | ICD-10-CM | POA: Insufficient documentation

## 2019-03-17 DIAGNOSIS — Z992 Dependence on renal dialysis: Secondary | ICD-10-CM | POA: Diagnosis not present

## 2019-03-17 DIAGNOSIS — Z8673 Personal history of transient ischemic attack (TIA), and cerebral infarction without residual deficits: Secondary | ICD-10-CM | POA: Insufficient documentation

## 2019-03-17 DIAGNOSIS — E1122 Type 2 diabetes mellitus with diabetic chronic kidney disease: Secondary | ICD-10-CM | POA: Diagnosis not present

## 2019-03-17 DIAGNOSIS — N186 End stage renal disease: Secondary | ICD-10-CM | POA: Insufficient documentation

## 2019-03-17 DIAGNOSIS — Z89512 Acquired absence of left leg below knee: Secondary | ICD-10-CM | POA: Diagnosis not present

## 2019-03-17 DIAGNOSIS — I132 Hypertensive heart and chronic kidney disease with heart failure and with stage 5 chronic kidney disease, or end stage renal disease: Secondary | ICD-10-CM | POA: Diagnosis not present

## 2019-03-17 DIAGNOSIS — E1151 Type 2 diabetes mellitus with diabetic peripheral angiopathy without gangrene: Secondary | ICD-10-CM | POA: Insufficient documentation

## 2019-03-17 DIAGNOSIS — Z87891 Personal history of nicotine dependence: Secondary | ICD-10-CM | POA: Diagnosis not present

## 2019-03-17 DIAGNOSIS — I5042 Chronic combined systolic (congestive) and diastolic (congestive) heart failure: Secondary | ICD-10-CM | POA: Diagnosis not present

## 2019-03-17 DIAGNOSIS — I480 Paroxysmal atrial fibrillation: Secondary | ICD-10-CM | POA: Diagnosis not present

## 2019-03-17 DIAGNOSIS — L97812 Non-pressure chronic ulcer of other part of right lower leg with fat layer exposed: Secondary | ICD-10-CM | POA: Diagnosis not present

## 2019-03-17 DIAGNOSIS — E11622 Type 2 diabetes mellitus with other skin ulcer: Secondary | ICD-10-CM | POA: Diagnosis present

## 2019-03-18 NOTE — Progress Notes (Signed)
TOMIE, ELKO ([id number]) Visit Report for 03/17/2019 Arrival Information Details Patient Name: [name], [name] Date of Service: 03/17/2019 8:15 AM Medical Record Number: [id number] Patient Account Number: [id number] Date of Birth/Sex: 08-16-1961 (58 y.o. M) Treating RN: Harold Barban Primary Care Graydon Fofana: [name] Other Clinician: Referring Kevonna Nolte: [name] Treating Mistee Soliman/Extender: [name], HOYT Weeks in Treatment: 7 Visit Information History Since Last Visit Added or deleted any medications: No Patient Arrived: Wheel Chair Any new allergies or adverse reactions: No Arrival Time: 08:16 Had a fall or experienced change in No Accompanied By: wife activities of daily living that may affect Transfer Assistance: None risk of falls: Patient Identification Verified: Yes Signs or symptoms of abuse/neglect since last visito No Secondary Verification Process Completed: Yes Hospitalized since last visit: No Patient Has Alerts: Yes Has Dressing in Place as Prescribed: Yes Patient Alerts: Type II Diabetic Pain Present Now: Yes Aspirin 81mg  Electronic Signature(s) Signed: 03/17/2019 3:14:04 PM By: Harold Barban Entered By: Harold Barban on 03/17/2019 08:20:29 [name] ([id number]) -------------------------------------------------------------------------------- Encounter Discharge Information Details Patient Name: [name], [name] Date of Service: 03/17/2019 8:15 AM Medical Record Number: [id number] Patient Account Number: [id number] Date of Birth/Sex: 1961-06-22 (58 y.o. M) Treating RN: Harold Barban Primary Care Sora Olivo: [name] Other Clinician: Referring Artemisia Auvil: [name] Treating Daryon Remmert/Extender: [name], HOYT Weeks in Treatment: 7 Encounter Discharge Information Items Post Procedure Vitals Discharge Condition: Stable Temperature (F): 98.0 Ambulatory Status: Wheelchair Pulse (bpm): 78 Discharge Destination: Home Respiratory Rate  (breaths/min): 18 Transportation: Private Auto Blood Pressure (mmHg): 118/59 Accompanied By: family Schedule Follow-up Appointment: Yes Clinical Summary of Care: Electronic Signature(s) Signed: 03/17/2019 3:14:04 PM By: Harold Barban Entered By: Harold Barban on 03/17/2019 09:10:37 [name] ([id number]) -------------------------------------------------------------------------------- Lower Extremity Assessment Details Patient Name: [name], [name] Date of Service: 03/17/2019 8:15 AM Medical Record Number: [id number] Patient Account Number: [id number] Date of Birth/Sex: [birth year] (58 y.o. M) Treating RN: Harold Barban Primary Care Andriea Hasegawa: [name] Other Clinician: Referring Lycan Davee: [name] Treating Kastiel Simonian/Extender: [name], HOYT Weeks in Treatment: 7 Electronic Signature(s) Signed: 03/17/2019 3:14:04 PM By: Harold Barban Entered By: Harold Barban on 03/17/2019 08:28:39 [name] ([id number]) -------------------------------------------------------------------------------- Multi Wound Chart Details Patient Name: [name], [name] Date of Service: 03/17/2019 8:15 AM Medical Record Number: [id number] Patient Account Number: [id number] Date of Birth/Sex: 1961-06-12 (58 y.o. M) Treating RN: Harold Barban Primary Care Shanitha Twining: [name] Other Clinician: Referring Ayssa Bentivegna: [name] Treating Zai Chmiel/Extender: STONE III, HOYT Weeks in Treatment: 7 Vital Signs Height(in): 67 Pulse(bpm): 78 Weight(lbs): 220 Blood Pressure(mmHg): 118/59 Body Mass Index(BMI): 34 Temperature(F): 98.0 Respiratory Rate 18 (breaths/min): Photos: [N/A:N/A] Wound Location: Right Amputation Site - Above N/A N/A Knee Wounding Event: Surgical Injury N/A N/A Primary Etiology: Arterial Insufficiency Ulcer N/A N/A Secondary Etiology: Diabetic Wound/Ulcer of the N/A N/A Lower Extremity Comorbid History: Arrhythmia, Congestive Heart N/A N/A Failure,  Hypertension, Peripheral Arterial Disease, Type II Diabetes, End Stage Renal Disease Date Acquired: 09/09/2018 N/A N/A Weeks of Treatment: 7 N/A N/A Wound Status: Open N/A N/A Measurements L x W x D 3.5x6x0.2 N/A N/A (cm) Area (cm) : 16.493 N/A N/A Volume (cm) : 3.299 N/A N/A % Reduction in Area: -7.70% N/A N/A % Reduction in Volume: 64.10% N/A N/A Classification: Full Thickness Without N/A N/A Exposed Support Structures Exudate Amount: Medium N/A N/A Exudate Type: Serosanguineous N/A N/A Exudate Color: red, brown N/A N/A Wound Margin: Thickened N/A N/A Granulation Amount: Medium (34-66%) N/A N/A Granulation Quality: Red, Hyper-granulation N/A N/A Necrotic Amount: Medium (34-66%)  N/A N/A [name], [name] ([id number]) Exposed Structures: Fat Layer (Subcutaneous N/A N/A Tissue) Exposed: Yes Fascia: No Tendon: No Muscle: No Joint: No Bone: No Epithelialization: Small (1-33%) N/A N/A Treatment Notes Electronic Signature(s) Signed: 03/17/2019 3:14:04 PM By: Harold Barban Entered By: Harold Barban on 03/17/2019 08:42:35 [name] ([id number]) -------------------------------------------------------------------------------- [place] Details Patient Name: [name], [name] Date of Service: 03/17/2019 8:15 AM Medical Record Number: [id number] Patient Account Number: [id number] Date of Birth/Sex: 1961/01/22 (58 y.o. M) Treating RN: Harold Barban Primary Care Dannie Hattabaugh: [name] Other Clinician: Referring Alaisha Eversley: [name] Treating Adela Esteban/Extender: [name], HOYT Weeks in Treatment: 7 Active Inactive Abuse / Safety / Falls / Self Care Management Nursing Diagnoses: Impaired physical mobility Goals: Patient will not develop complications from immobility Date Initiated: 01/27/2019 Target Resolution Date: 04/25/2019 Goal Status: Active Interventions: Assess fall risk on admission and as needed Notes: Orientation to the Wound Care  Program Nursing Diagnoses: Knowledge deficit related to the wound healing center program Goals: Patient/caregiver will verbalize understanding of the [place] [facility] Date Initiated: 01/27/2019 Target Resolution Date: 04/25/2019 Goal Status: Active Interventions: Provide education on orientation to the wound center Notes: Pain, Acute or Chronic Nursing Diagnoses: Pain, acute or chronic: actual or potential Goals: Patient will verbalize adequate pain control and receive pain control interventions during procedures as needed Date Initiated: 01/27/2019 Target Resolution Date: 04/25/2019 Goal Status: Active Interventions: Complete pain assessment as per visit requirements HAIDAR, MUSE. ([id number]) Notes: Wound/Skin Impairment Nursing Diagnoses: Impaired tissue integrity Goals: Ulcer/skin breakdown will heal within 14 weeks Date Initiated: 01/27/2019 Target Resolution Date: 04/25/2019 Goal Status: Active Interventions: Assess patient/caregiver ability to obtain necessary supplies Assess patient/caregiver ability to perform ulcer/skin care regimen upon admission and as needed Assess ulceration(s) every visit Notes: Electronic Signature(s) Signed: 03/17/2019 3:14:04 PM By: Harold Barban Entered By: Harold Barban on 03/17/2019 08:48:34 Summey, [name] ([id number]) -------------------------------------------------------------------------------- Pain Assessment Details Patient Name: [name], [name] Date of Service: 03/17/2019 8:15 AM Medical Record Number: [id number] Patient Account Number: [id number] Date of Birth/Sex: 1960-11-19 (58 y.o. M) Treating RN: Harold Barban Primary Care Sukhmani Fetherolf: [name] Other Clinician: Referring Einer Meals: [name] Treating Riaan Toledo/Extender: STONE III, HOYT Weeks in Treatment: 7 Active Problems Location of Pain Severity and Description of Pain Patient Has Paino Yes Site Locations Rate the pain. Current Pain Level:  3 Pain Management and Medication Current Pain Management: Electronic Signature(s) Signed: 03/17/2019 3:14:04 PM By: Harold Barban Entered By: Harold Barban on 03/17/2019 08:20:48 [name] ([id number]) -------------------------------------------------------------------------------- Patient/Caregiver Education Details Patient Name: [name], Hermann L. Date of Service: 03/17/2019 8:15 AM Medical Record Number: [id number] Patient Account Number: [id number] Date of Birth/Gender: 1961-02-27 (58 y.o. M) Treating RN: Harold Barban Primary Care Physician: [name] Other Clinician: Referring Physician: [name] Treating Physician/Extender: Sharalyn Ink in Treatment: 7 Education Assessment Education Provided To: Patient Education Topics Provided Wound/Skin Impairment: Handouts: Caring for Your Ulcer Spanish Methods: Demonstration, Explain/Verbal Responses: State content correctly Electronic Signature(s) Signed: 03/17/2019 3:14:04 PM By: Harold Barban Entered By: Harold Barban on 03/17/2019 08:42:54 Zendejas, [name] ([id number]) -------------------------------------------------------------------------------- Wound Assessment Details Patient Name: [name], [name] Date of Service: 03/17/2019 8:15 AM Medical Record Number: [id number] Patient Account Number: [id number] Date of Birth/Sex: 05-Oct-1961 (58 y.o. M) Treating RN: Harold Barban Primary Care Jaylia Pettus: [name] Other Clinician: Referring Kristain Filo: [name] Treating Taiana Temkin/Extender: STONE III, HOYT Weeks in Treatment: 7 Wound Status Wound Number: 1 Primary Arterial Insufficiency Ulcer Etiology: Wound Location: Right Amputation Site - Above Knee Secondary  Diabetic Wound/Ulcer of the Lower Extremity Wounding Event: Surgical Injury Etiology: Date Acquired: 09/09/2018 Wound Open Weeks Of Treatment: 7 Status: Clustered Wound: No Comorbid Arrhythmia, Congestive Heart Failure, History:  Hypertension, Peripheral Arterial Disease, Type II Diabetes, End Stage Renal Disease Photos Wound Measurements Length: (cm) 3.5 Width: (cm) 6 Depth: (cm) 0.2 Area: (cm) 16.493 Volume: (cm) 3.299 % Reduction in Area: -7.7% % Reduction in Volume: 64.1% Epithelialization: Small (1-33%) Tunneling: No Undermining: No Wound Description Full Thickness Without Exposed Support Foul Od Classification: Structures Slough/ Wound Margin: Thickened Exudate Medium Amount: Exudate Type: Serosanguineous Exudate Color: red, brown or After Cleansing: No Fibrino Yes Wound Bed Granulation Amount: Medium (34-66%) Exposed Structure Granulation Quality: Red, Hyper-granulation Fascia Exposed: No Necrotic Amount: Medium (34-66%) Fat Layer (Subcutaneous Tissue) Exposed: Yes Necrotic Quality: Adherent Slough Tendon Exposed: No Muscle Exposed: No Joint Exposed: No [name], [name] ([id number]) Bone Exposed: No Treatment Notes Wound #1 (Right Amputation Site - Above Knee) Notes Santyl, saline moistened gauze, BFD Electronic Signature(s) Signed: 03/17/2019 3:14:04 PM By: Harold Barban Entered By: Harold Barban on 03/17/2019 49:82:64 [name] ([id number]) -------------------------------------------------------------------------------- Vitals Details Patient Name: [name], Finley L. Date of Service: 03/17/2019 8:15 AM Medical Record Number: [id number] Patient Account Number: [id number] Date of Birth/Sex: 1960/12/11 (58 y.o. M) Treating RN: Harold Barban Primary Care Jaymz Traywick: [name] Other Clinician: Referring Lasandra Batley: [name] Treating Jt Brabec/Extender: STONE III, HOYT Weeks in Treatment: 7 Vital Signs Time Taken: 08:20 Temperature (F): 98.0 Height (in): 67 Pulse (bpm): 78 Weight (lbs): 220 Respiratory Rate (breaths/min): 18 Body Mass Index (BMI): 34.5 Blood Pressure (mmHg): 118/59 Reference Range: 80 - 120 mg / dl Electronic Signature(s) Signed: 03/17/2019  3:14:04 PM By: Harold Barban Entered By: Harold Barban on 03/17/2019 08:23:30

## 2019-03-20 NOTE — Progress Notes (Signed)
HERB, BELTRE ([id number]) Visit Report for 03/17/2019 Chief Complaint Document Details Patient Name: [name], [name] Date of Service: 03/17/2019 8:15 AM Medical Record Number: [id number] Patient Account Number: [id number] Date of Birth/Sex: 1961-03-18 (58 y.o. M) Treating RN: [name] Primary Care Provider: [name] Other Clinician: Referring Provider: [name] Treating Provider/Extender: [name], Kristopher Weeks in Treatment: 7 Information Obtained from: Patient Chief Complaint Right AKA Ulcer at surgical incision Electronic Signature(s) Signed: 03/18/2019 2:50:08 AM By: Worthy Keeler PA-C Entered By: Worthy Keeler on 03/17/2019 08:23:58 [name] ([id number]) -------------------------------------------------------------------------------- Debridement Details Patient Name: [name], [name] Date of Service: 03/17/2019 8:15 AM Medical Record Number: [id number] Patient Account Number: [id number] Date of Birth/Sex: 05-24-1961 (58 y.o. M) Treating RN: Harold Barban Primary Care Provider: [name] Other Clinician: Referring Provider: [name] Treating Provider/Extender: [name], Kristopher Weeks in Treatment: 7 Debridement Performed for Wound #1 Right Amputation Site - Above Knee Assessment: Performed By: Physician [name], Kristopher E., PA-C Debridement Type: Debridement Severity of Tissue Pre Fat layer exposed Debridement: Level of Consciousness (Pre- Awake and Alert procedure): Pre-procedure Verification/Time Yes - 09:02 Out Taken: Start Time: 09:02 Pain Control: Lidocaine Total Area Debrided (L x W): 3.5 (cm) x 6 (cm) = 21 (cm) Tissue and other material Viable, Non-Viable, Slough, Subcutaneous, Slough debrided: Level: Skin/Subcutaneous Tissue Debridement Description: Excisional Instrument: Curette Bleeding: Minimum Hemostasis Achieved: Pressure End Time: 09:04 Procedural Pain: 5 Post Procedural Pain: 0 Response to Treatment: Procedure was  tolerated well Level of Consciousness Awake and Alert (Post-procedure): Post Debridement Measurements of Total Wound Length: (cm) 3.5 Width: (cm) 6 Depth: (cm) 0.2 Volume: (cm) 3.299 Character of Wound/Ulcer Post Debridement: Improved Severity of Tissue Post Debridement: Fat layer exposed Post Procedure Diagnosis Same as Pre-procedure Electronic Signature(s) Signed: 03/17/2019 3:14:04 PM By: Harold Barban Signed: 03/18/2019 2:50:08 AM By: Worthy Keeler PA-C Entered By: Harold Barban on 03/17/2019 09:04:35 [name], [name] ([id number]) -------------------------------------------------------------------------------- HPI Details Patient Name: [name], [name] Date of Service: 03/17/2019 8:15 AM Medical Record Number: [id number] Patient Account Number: [id number] Date of Birth/Sex: 09-May-1961 (58 y.o. M) Treating RN: [name] Primary Care Provider: [name] Other Clinician: Referring Provider: [name] Treating Provider/Extender: [name], Kristopher Weeks in Treatment: 7 History of Present Illness HPI Description: 01/27/19 patient presents today for initial evaluation here in our clinic concerning an issue he's been having with a wound at the right above knee amputation site which began shortly following the surgery in November 2019. 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 He has a history of hypertension, diabetes, congestive heart failure, and states that he is having a lot of discomfort at the wound site unfortunately. There does not appear to be any signs of active infection upon initial inspection today. No fevers, chills, nausea, or vomiting noted at this time.. Silvadene Cream has been utilized at some point on this wound as well. Patient did have home health coming out but  unfortunately the patient and his wife state that they were not happy with the services being rendered and therefore discontinued the service. There does not appear to be any signs of otherwise active infection at this time which is good news. The patient states he's definitely ready for this to heal and has been a symptomatic time since his surgery and he  is becoming somewhat frustrated. Iodoflex also sounds to have previously been used on the wound that is when the patient's wife states the wound seem to get worse not better. As best I can tell [name] has not been utilized. 02/03/19 on evaluation today patient appears to be doing much better compared to last week's evaluation in regard to his ulcer on the right amputation site. He's been tolerating the dressing changes without complication which is good news. The Santyl does seem to be of benefit in fact the wound appears to be doing significantly better compared to my prior evaluation. Overall very pleased in this regard. No fevers, chills, nausea, or vomiting noted at this time. 02/10/19 on evaluation today patient's wound actually appears to be showing signs of excellent improvement at this time which is great news. He has been tolerating the dressing changes without complication overall I feel like that there is no signs of active infection at this point which is also good news. I feel like that the [name] is very beneficial. Patient's wife likewise feels like things are doing quite well and is not having pain like he was in the past. 02/17/19 on evaluation today patient appears to be doing well in regard to the wound on the distal aspect of his right amputation site. He's been tolerating treatment with the Santyl at this point. I think he is nearing completion as far as treatment with cental is concerned I think were very close to getting this significantly better. Fortunately there's no evidence of infection currently. 03/03/19 on evaluation today  patient actually appears to be doing quite well in regard to his his right imputation site ulceration. In fact there's a lot of new skin growth which is excellent news. I'm very pleased in this regard. Fortunately there's no signs of active infection also good news. No fevers, chills, nausea, or vomiting noted at this time. 03/17/19 on evaluation today patient's wound actually appears to be showing signs of improvement which is good news. He does have a lot of issues however with concerns about how slowly this seems to be healing as far as he and his wife are concerned. I do believe some of the issues that were having currently are from the language barrier perspective but even that we have an interpreter that has been present for each evaluation I still think that there is some issues with their overall understanding of the treatment plan and how long wounds of this nature do take to heal. We began seeing him on April 14 and in [name] over a month and a half he has made improvement to where the wound is one third the size originally began. Nonetheless there still quite a bit of feeling left to do but again he has made great progress up to this point. Unfortunately he did have some issues with the silver collagen that was ordered he received something different from the [employer]. This was Colactive. Unfortunately this actually calls some burning he seemed to have some irritation from the dressing itself that caused irritation and redness around the edge of the wound that seems to be improving but nonetheless he and his wife both believe that the Santyl might've done better. They also really want to try to utilize something that will prevent me having to degree the wound as he typically has discomfort with the debridement obviously. Electronic Signature(s) Signed: 03/18/2019 2:50:08 AM By: Clovis Pu, Draedyn Carlean Jews ([id number]) Entered By: Worthy Keeler on 03/18/2019 00:05:12 [name],  [name]  ([id number]) -------------------------------------------------------------------------------- Physical Exam Details Patient Name: [name], [name] Date of Service: 03/17/2019 8:15 AM Medical Record Number: [id number] Patient Account Number: [id number] Date of Birth/Sex: [birth year] (58 y.o. M) Treating RN: [name] Primary Care Provider: [name] Other Clinician: Referring Provider: [name] Treating Provider/Extender: [name], Kristopher Weeks in Treatment: 7 Constitutional Well-nourished and well-hydrated in no acute distress. Respiratory normal breathing without difficulty. Psychiatric this patient is able to make decisions and demonstrates good insight into disease process. Alert and Oriented x 3. pleasant and cooperative. Notes Patient's wound bed actually shows fairly good granulation there was some Slough especially around the edge of the wound which did require sharp debridement today and I did perform this without any Senate campaign he made his pain at the worst to be 5/10. That was doing the actual debridement. This was better post debridement. Nonetheless it was something that was necessary to clear up some of the more adhered slough eschar around the edge of the wound in order to allow this to completely and more effectively healed. Electronic Signature(s) Signed: 03/18/2019 2:50:08 AM By: Worthy Keeler PA-C Entered By: Worthy Keeler on 03/18/2019 00:05:49 [name] ([id number]) -------------------------------------------------------------------------------- Physician Orders Details Patient Name: [name], Ludwin L. Date of Service: 03/17/2019 8:15 AM Medical Record Number: [id number] Patient Account Number: [id number] Date of Birth/Sex: 15-May-1961 (58 y.o. M) Treating RN: Harold Barban Primary Care Provider: [name] Other Clinician: Referring Provider: [name] Treating Provider/Extender: [name], Kristopher Weeks in Treatment: 7 Verbal / Phone  Orders: No Diagnosis Coding ICD-10 Coding Code Description I73.89 Other specified peripheral vascular diseases L97.812 Non-pressure chronic ulcer of other part of right lower leg with fat layer exposed Z89.611 Acquired absence of right leg above knee E11.622 Type 2 diabetes mellitus with other skin ulcer I50.42 Chronic combined systolic (congestive) and diastolic (congestive) heart failure N18.6 End stage renal disease Z99.2 Dependence on renal dialysis I10 Essential (primary) hypertension I48.0 Paroxysmal atrial fibrillation Wound Cleansing Wound #1 Right Amputation Site - Above Knee o Clean wound with Normal Saline. Anesthetic (add to Medication List) Wound #1 Right Amputation Site - Above Knee o Topical Lidocaine 4% cream applied to wound bed prior to debridement (In Clinic Only). Primary Wound Dressing Wound #1 Right Amputation Site - Above Knee o Santyl Ointment - Apply evenly over open wound, nickle thickness. Secondary Dressing Wound #1 Right Amputation Site - Above Knee o Saline moistened gauze o Boardered Foam Dressing Dressing Change Frequency Wound #1 Right Amputation Site - Above Knee o Change dressing every other day. Follow-up Appointments Wound #1 Right Amputation Site - Above Knee o Return Appointment in 1 week. Patient Medications [name], [name] ([id number]) Allergies: No Known Drug Allergies Notifications Medication Indication Start End Santyl 03/17/2019 DOSE topical 250 unit/gram ointment - ointment topical applied nickel thick to the wound bed and then cover with a dressing as directed Electronic Signature(s) Signed: 03/17/2019 9:33:59 AM By: Worthy Keeler PA-C Entered By: Worthy Keeler on 03/17/2019 09:33:58 [name], [name] ([id number]) -------------------------------------------------------------------------------- Problem List Details Patient Name: [name], Tylee L. Date of Service: 03/17/2019 8:15 AM Medical Record Number:  [id number] Patient Account Number: [id number] Date of Birth/Sex: 07-Oct-1961 (58 y.o. M) Treating RN: [name] Primary Care Provider: [name] Other Clinician: Referring Provider: [name] Treating Provider/Extender: [name], Kristopher Weeks in Treatment: 7 Active Problems ICD-10 Evaluated Encounter Code Description Active Date Today Diagnosis I73.89 Other specified peripheral vascular diseases 01/27/2019 No Yes L97.812 Non-pressure chronic ulcer of  other part of right lower leg 01/27/2019 No Yes with fat layer exposed Z89.611 Acquired absence of right leg above knee 01/27/2019 No Yes E11.622 Type 2 diabetes mellitus with other skin ulcer 01/27/2019 No Yes I50.42 Chronic combined systolic (congestive) and diastolic 11/08/5807 No Yes (congestive) heart failure N18.6 End stage renal disease 01/27/2019 No Yes Z99.2 Dependence on renal dialysis 01/27/2019 No Yes I10 Essential (primary) hypertension 01/27/2019 No Yes I48.0 Paroxysmal atrial fibrillation 01/27/2019 No Yes Inactive Problems Resolved Problems Kristopher, [name] ([id number]) Electronic Signature(s) Signed: 03/18/2019 2:50:08 AM By: Worthy Keeler PA-C Entered By: Worthy Keeler on 03/17/2019 08:23:52 [name], [name] ([id number]) -------------------------------------------------------------------------------- Progress Note Details Patient Name: [name], Jamail L. Date of Service: 03/17/2019 8:15 AM Medical Record Number: [id number] Patient Account Number: [id number] Date of Birth/Sex: December 04, 1960 (58 y.o. 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 He has a history of hypertension, diabetes, congestive heart failure, and states that he is having a lot of discomfort at the wound site unfortunately. There does not appear to be any signs of active infection upon initial inspection today. No fevers, chills, nausea, or vomiting noted at this time.. Silvadene Cream has been utilized at some point on this wound as well. Patient did have home health coming out but unfortunately the patient and his wife state that they were not happy with the services being rendered and therefore discontinued the service. There does not appear to be any signs of otherwise active infection at this time which is good news. The patient states he's definitely ready for this to heal and has been a symptomatic time since his surgery and he is becoming somewhat frustrated. Iodoflex also sounds to have previously been used on the wound that is when the patient's wife states the wound seem to get worse not better. As best I can tell [name] has not been utilized. 02/03/19 on evaluation today patient appears to be doing much better compared to last week's evaluation in regard to his ulcer on the right amputation site. He's been tolerating the dressing changes without complication which is good news. The Santyl does seem to be of benefit in fact the wound appears to be doing significantly better compared to my prior evaluation. Overall very pleased in this regard. No fevers, chills, nausea, or vomiting noted at this time. 02/10/19 on evaluation today patient's wound actually appears to be showing signs of excellent improvement at this  time which is great news. He has been tolerating the dressing changes without complication overall I feel like that there is no signs of active infection at this point which is also good news. I feel like that the [name] is very beneficial. Patient's wife likewise feels like things are doing quite well and is not having pain like he was in the past. 02/17/19 on evaluation today patient appears to be doing  well in regard to the wound on the distal aspect of his right amputation site. He's been tolerating treatment with the Santyl at this point. I think he is nearing completion as far as treatment with cental is concerned I think were very close to getting this significantly better. Fortunately there's no evidence of infection currently. 03/03/19 on evaluation today patient actually appears to be doing quite well in regard to his his right imputation site ulceration. In fact there's a lot of new skin growth which is excellent news. I'm very pleased in this regard. Fortunately there's no signs of active infection also good news. No fevers, chills, nausea, or vomiting noted at this time. 03/17/19 on evaluation today patient's wound actually appears to be showing signs of improvement which is good news. He does have a lot of issues however with concerns about how slowly this seems to be healing as far as he and his wife are concerned. I do believe some of the issues that were having currently are from the language barrier perspective but even that we have an interpreter that has been present for each evaluation I still think that there is some issues with their overall understanding of the treatment plan and how long wounds of this nature do take to heal. We began seeing him on April 14 and in [name] over a month and a half he has made improvement to where the wound is one third the size originally began. Nonetheless there still quite a bit of feeling left to do but again he has made great progress up to this  point. Unfortunately he did have some issues with the silver collagen that was ordered he received something different from the [employer]. This was Colactive. Unfortunately this actually calls some burning he seemed to have some irritation from the dressing itself that caused irritation and redness around the edge of the wound that seems to be improving but nonetheless he and his wife both [name], [name] ([id number]) believe that the Santyl might've done better. They also really want to try to utilize something that will prevent me having to degree the wound as he typically has discomfort with the debridement obviously. Patient History Information obtained from Patient. Family History Cancer - Father, Diabetes - Siblings, No family history of Hereditary Spherocytosis. Social History Former smoker - ended on 10/16/1994, Marital Status - Married, Alcohol Use - Never, Drug Use - No History, Caffeine Use - Rarely. Medical History Cardiovascular Patient has history of Arrhythmia - A-fib, Congestive Heart Failure, Hypertension, Peripheral Arterial Disease - Bilateral LE amputations Endocrine Patient has history of Type II Diabetes Genitourinary Patient has history of End Stage Renal Disease - Dialysis M,W,F Hospitalization/Surgery History - Right AKA. - Left BKA. 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 Respiratory Denies complaints or symptoms of Chronic or frequent coughs, Shortness of Breath. Cardiovascular Denies complaints or symptoms of Chest pain, LE edema. Psychiatric Denies complaints or symptoms of Anxiety, Claustrophobia. Objective Constitutional Well-nourished and well-hydrated in no acute distress. Vitals Time Taken: 8:20 AM,  Height: 67 in, Weight: 220 lbs, BMI: 34.5, Temperature: 98.0 F, Pulse: 78 bpm, Respiratory Rate: 18 breaths/min, Blood Pressure: 118/59 mmHg. Respiratory normal breathing without difficulty. [name], [name] ([id number]) Psychiatric this patient is able to make decisions and demonstrates good insight into disease process.  Alert and Oriented x 3. pleasant and cooperative. General Notes: Patient's wound bed actually shows fairly good granulation there was some Slough especially around the edge of the wound which did require sharp debridement today and I did perform this without any Senate campaign he made his pain at the worst to be 5/10. That was doing the actual debridement. This was better post debridement. Nonetheless it was something that was necessary to clear up some of the more adhered slough eschar around the edge of the wound in order to allow this to completely and more effectively healed. Integumentary (Hair, Skin) Wound #1 status is Open. Original cause of wound was Surgical Injury. The wound is located on the Right Amputation Site - Above Knee. The wound measures 3.5cm length x 6cm width x 0.2cm depth; 16.493cm^2 area and 3.299cm^3 volume. There is Fat Layer (Subcutaneous Tissue) Exposed exposed. There is no tunneling or undermining noted. There is a medium amount of serosanguineous drainage noted. The wound margin is thickened. There is medium (34-66%) red, hyper - granulation within the wound bed. There is a medium (34-66%) amount of necrotic tissue within the wound bed including Adherent Slough. 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 There was a Excisional Skin/Subcutaneous Tissue Debridement with a total area of 21 sq cm performed by [name], Kristopher E., PA-C. With the following instrument(s): Curette to remove Viable and Non-Viable tissue/material. Material removed includes Subcutaneous Tissue and Slough and after achieving pain control using Lidocaine. No specimens were taken. A time out was conducted at 09:02, prior to the start of the procedure. A Minimum amount of bleeding was controlled with Pressure. The procedure was tolerated well with a pain level of 5 throughout and a pain level of 0 following the procedure. Post Debridement Measurements: 3.5cm length x 6cm width x 0.2cm depth; 3.299cm^3 volume. Character of Wound/Ulcer Post Debridement is improved. Severity of Tissue Post Debridement is: Fat layer exposed. Post procedure Diagnosis Wound #1: Same as Pre-Procedure Juste, Tonnie L. ([id number]) Plan Wound Cleansing: Wound #1 Right Amputation Site - Above Knee: Clean wound with Normal Saline. Anesthetic (add to Medication List): Wound #1 Right Amputation Site - Above Knee: Topical Lidocaine 4% cream applied to wound bed prior to debridement (In Clinic Only). Primary Wound Dressing: Wound #1 Right Amputation Site - Above Knee: Santyl Ointment - Apply evenly over open wound, nickle thickness. Secondary Dressing: Wound #1 Right Amputation Site - Above Knee: Saline moistened gauze Boardered Foam Dressing Dressing Change Frequency: Wound #1 Right Amputation Site - Above Knee: Change dressing every other day. Follow-up Appointments: Wound #1 Right Amputation Site - Above Knee: Return Appointment in 1 week. The following medication(s) was  prescribed: Santyl topical 250 unit/gram ointment ointment topical applied nickel thick to the wound bed and then cover with a dressing as directed starting 03/17/2019 I had an extremely lengthy conversation with the patient today. In fact this extended to the point that we were discussing his wound in the progress along with treatment options where  we are gonna go for at least 30 minutes today. Obviously that is a significant amount of time but again does seem to be some issues even with the interpreter with the language barrier although I'm not exactly sure what the issue was. The interpreter did an excellent job at passing along information to the patient but they seem to be having a difficult time following and understanding. Nonetheless again we were able to think effectively explain the situation and why this is gonna take such a long time although again I think they also finally understand that the wound has gotten significantly smaller and better and seems to be doing quite well. I am gonna switch back to the cental as he was more happy with this and it will help to debride the wound and hopefully prevent me from having to perform sharp agreement in the office more effectively. He's in agreement that plan. If anything changes or worsens meantime he will contact the office and let me know otherwise I'm gonna see were things stand in one weeks time. I did send in a refill for the sample to the pharmacy for him. Please see above for specific wound care orders. We will see patient for re-evaluation in 1 week(s) here in the clinic. If anything worsens or changes patient will contact our office for additional recommendations. Electronic Signature(s) Signed: 03/18/2019 2:50:08 AM By: Worthy Keeler PA-C Entered By: Worthy Keeler on 03/18/2019 00:07:21 [name] ([id number]) -------------------------------------------------------------------------------- ROS/PFSH Details Patient Name:  [name], Mikale L. Date of Service: 03/17/2019 8:15 AM Medical Record Number: [id number] Patient Account Number: [id number] Date of Birth/Sex: October 08, 1961 (58 y.o. 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 Respiratory Complaints and Symptoms: Negative for: Chronic or frequent coughs; Shortness of Breath Cardiovascular Complaints and Symptoms: Negative for: Chest pain; LE edema Medical History: Positive for: Arrhythmia - A-fib; Congestive Heart Failure; Hypertension; Peripheral Arterial Disease - Bilateral LE amputations Psychiatric Complaints and Symptoms: Negative for: Anxiety; Claustrophobia Endocrine Medical History: Positive for: Type II Diabetes Time with diabetes: 14 years Treated with: Oral agents Blood sugar tested every day: No Blood sugar testing results: Breakfast: 128; Lunch: 145 Genitourinary Medical History: Positive for: End Stage Renal Disease - Dialysis M,W,F Immunizations DOSSIE, SWOR. ([id number]) Pneumococcal Vaccine: Received Pneumococcal Vaccination: Yes Implantable Devices None Hospitalization / Surgery History Type of Hospitalization/Surgery Right AKA Left BKA Family and Social History Cancer: Yes - Father; Diabetes: Yes - Siblings; Hereditary Spherocytosis: No; Former smoker - ended on 10/16/1994; Marital Status - Married; Alcohol Use: Never; Drug Use: No History; Caffeine Use: Rarely Physician Affirmation I have reviewed and agree with the above information. Electronic Signature(s) Signed: 03/19/2019 8:12:09 AM By: Gretta Cool, BSN, RN,  CWS, Kim RN, BSN Signed: 03/20/2019 11:48:14 AM By: Worthy Keeler PA-C Previous Signature: 03/18/2019 2:50:08 AM Version By: Worthy Keeler PA-C Entered By: Gretta Cool BSN, RN, CWS, Kim on 03/19/2019 07:58:56 [name] ([id number]) -------------------------------------------------------------------------------- SuperBill Details Patient Name: GEESEY, Riddik L. Date of Service: 03/17/2019 Medical Record Number: [id number] Patient Account Number: [id number] Date of Birth/Sex: [birth year] (58 y.o. 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 E11.622 Type 2 diabetes mellitus with other skin ulcer I50.42 Chronic combined systolic (congestive) and diastolic (congestive) heart failure N18.6 End stage renal disease Z99.2 Dependence on renal dialysis I10 Essential (primary) hypertension I48.0 Paroxysmal atrial fibrillation Facility Procedures CPT4 Code Description: 00459977 11042 - DEB SUBQ TISSUE 20 SQ CM/< ICD-10 Diagnosis Description L97.812 Non-pressure chronic ulcer of other part of right lower leg wi Modifier: th fat layer expo Quantity: 1 sed CPT4 Code Description: 41423953 11045 - DEB SUBQ TISS EA ADDL 20CM ICD-10 Diagnosis Description U02.334 Non-pressure chronic ulcer of other part of right lower leg wi Modifier: th fat layer expo Quantity: 1 sed Physician Procedures CPT4 Code Description: 3568616 83729 - WC PHYS SUBQ TISS 20 SQ CM ICD-10 Diagnosis Description M21.115 Non-pressure chronic ulcer of other part of right lower leg with Modifier: fat layer expo Quantity: 1 sed CPT4 Code Description: 5208022 33612 -  WC PHYS SUBQ TISS EA ADDL 20 CM ICD-10 Diagnosis Description A44.975 Non-pressure chronic ulcer of other part of right lower leg with Modifier: fat layer expo Quantity: 1 sed Electronic Signature(s) Signed: 03/18/2019 2:50:08 AM By: Worthy Keeler PA-C Entered By: Worthy Keeler on 03/18/2019 00:07:36

## 2019-03-31 ENCOUNTER — Other Ambulatory Visit: Payer: Self-pay

## 2019-03-31 ENCOUNTER — Encounter: Payer: Medicare Other | Admitting: Physician Assistant

## 2019-03-31 DIAGNOSIS — E11622 Type 2 diabetes mellitus with other skin ulcer: Secondary | ICD-10-CM | POA: Diagnosis not present

## 2019-04-01 NOTE — Progress Notes (Addendum)
LORANZO, DESHA ([id number]) Visit Report for 03/31/2019 Arrival Information Details Patient Name: [name], [name] Date of Service: 03/31/2019 8:15 AM Medical Record Number: [id number] Patient Account Number: [id number] Date of Birth/Sex: May 02, 1961 (58 y.o. M) Treating RN: Army Melia Primary Care Aleczander Fandino: [name] Other Clinician: Referring [place] Dube: [name] Treating Yarethzi Branan/Extender: [name], HOYT Weeks in Treatment: 9 Visit Information History Since Last Visit Added or deleted any medications: No Patient Arrived: Ambulatory Any new allergies or adverse reactions: No Arrival Time: 08:23 Had a fall or experienced change in No Accompanied By: wife activities of daily living that may affect Transfer Assistance: None risk of falls: Patient Has Alerts: Yes Signs or symptoms of abuse/neglect since last visito No Patient Alerts: Type II Diabetic Hospitalized since last visit: No Aspirin 81mg  Has Dressing in Place as Prescribed: Yes Pain Present Now: Yes Electronic Signature(s) Signed: 03/31/2019 9:57:52 AM By: Army Melia Entered By: Army Melia on 03/31/2019 08:24:27 [name] ([id number]) -------------------------------------------------------------------------------- Encounter Discharge Information Details Patient Name: [name], [name] Date of Service: 03/31/2019 8:15 AM Medical Record Number: [id number] Patient Account Number: [id number] Date of Birth/Sex: Mar 15, 1961 (58 y.o. M) Treating RN: [name] Primary Care Jerritt Cardoza: [name] Other Clinician: Referring Sherly Brodbeck: [name] Treating Janean Eischen/Extender: [name], HOYT Weeks in Treatment: 9 Encounter Discharge Information Items Post Procedure Vitals Discharge Condition: Stable Temperature (F): 98.3 Ambulatory Status: Wheelchair Pulse (bpm): 74 Discharge Destination: Home Respiratory Rate (breaths/min): 16 Transportation: Private Auto Blood Pressure (mmHg): 113/64 Accompanied  By: spouse Schedule Follow-up Appointment: Yes Clinical Summary of Care: Electronic Signature(s) Signed: 03/31/2019 4:26:15 PM By: [name] Entered By: [name] on 03/31/2019 08:51:26 Musleh, [name] ([id number]) -------------------------------------------------------------------------------- Lower Extremity Assessment Details Patient Name: [name], [name] Date of Service: 03/31/2019 8:15 AM Medical Record Number: [id number] Patient Account Number: [id number] Date of Birth/Sex: [birth year] (58 y.o. M) Treating RN: Army Melia Primary Care Maleiah Dula: [name] Other Clinician: Referring Jaima Janney: [name] Treating Cinque Begley/Extender: [name], HOYT Weeks in Treatment: 9 Electronic Signature(s) Signed: 03/31/2019 9:57:52 AM By: Army Melia Entered By: Army Melia on 03/31/2019 08:28:02 [name] ([id number]) -------------------------------------------------------------------------------- Multi Wound Chart Details Patient Name: [name], [name] Date of Service: 03/31/2019 8:15 AM Medical Record Number: [id number] Patient Account Number: [id number] Date of Birth/Sex: 07-13-1961 (58 y.o. M) Treating RN: [name] Primary Care Greysyn Vanderberg: [name] Other Clinician: Referring Rangel Echeverri: [name] Treating Elijahjames Fuelling/Extender: STONE III, HOYT Weeks in Treatment: 9 Vital Signs Height(in): 67 Pulse(bpm): 74 Weight(lbs): 220 Blood Pressure(mmHg): 113/64 Body Mass Index(BMI): 34 Temperature(F): 98.3 Respiratory Rate 16 (breaths/min): Photos: [1:No Photos] [N/A:N/A] Wound Location: [1:Right Amputation Site - Above Knee] [N/A:N/A] Wounding Event: [1:Surgical Injury] [N/A:N/A] Primary Etiology: [1:Arterial Insufficiency Ulcer] [N/A:N/A] Secondary Etiology: [1:Diabetic Wound/Ulcer of the Lower Extremity] [N/A:N/A] Comorbid History: [1:Arrhythmia, Congestive Heart Failure, Hypertension, Peripheral Arterial Disease, Type II Diabetes, End Stage Renal  Disease] [N/A:N/A] Date Acquired: [1:09/09/2018] [N/A:N/A] Weeks of Treatment: [1:9] [N/A:N/A] Wound Status: [1:Open] [N/A:N/A] Measurements L x W x D [1:3.5x5.4x0.2] [N/A:N/A] (cm) Area (cm) : [1:14.844] [N/A:N/A] Volume (cm) : [1:2.969] [N/A:N/A] % Reduction in Area: [1:3.10%] [N/A:N/A] % Reduction in Volume: [1:67.70%] [N/A:N/A] Classification: [1:Full Thickness Without Exposed Support Structures] [N/A:N/A] Exudate Amount: [1:Medium] [N/A:N/A] Exudate Type: [1:Serosanguineous] [N/A:N/A] Exudate Color: [1:red, brown] [N/A:N/A] Wound Margin: [1:Thickened] [N/A:N/A] Granulation Amount: [1:Medium (34-66%)] [N/A:N/A] Granulation Quality: [1:Red, Hyper-granulation] [N/A:N/A] Necrotic Amount: [1:Medium (34-66%)] [N/A:N/A] Exposed Structures: [1:Fat Layer (Subcutaneous Tissue) Exposed: Yes Fascia: No Tendon: No Muscle: No Joint: No Bone: No] [N/A:N/A] Epithelialization: Small (1-33%) N/A N/A Treatment Notes Electronic  Signature(s) Signed: 03/31/2019 4:26:15 PM By: [name] Entered By: [name] on 03/31/2019 08:38:44 [name] ([id number]) -------------------------------------------------------------------------------- [place] Details Patient Name: [name], [name] Date of Service: 03/31/2019 8:15 AM Medical Record Number: [id number] Patient Account Number: [id number] Date of Birth/Sex: April 26, 1961 (58 y.o. M) Treating RN: [name] Primary Care Yamilee Harmes: [name] Other Clinician: Referring Estelle Greenleaf: [name] Treating Carmelita Amparo/Extender: [name], HOYT Weeks in Treatment: 9 Active Inactive Abuse / Safety / Falls / Self Care Management Nursing Diagnoses: Impaired physical mobility Goals: Patient will not develop complications from immobility Date Initiated: 01/27/2019 Target Resolution Date: 04/25/2019 Goal Status: Active Interventions: Assess fall risk on admission and as needed Notes: Orientation to the Wound Care  Program Nursing Diagnoses: Knowledge deficit related to the wound healing center program Goals: Patient/caregiver will verbalize understanding of the [place] Program Date Initiated: 01/27/2019 Target Resolution Date: 04/25/2019 Goal Status: Active Interventions: Provide education on orientation to the wound center Notes: Pain, Acute or Chronic Nursing Diagnoses: Pain, acute or chronic: actual or potential Goals: Patient will verbalize adequate pain control and receive pain control interventions during procedures as needed Date Initiated: 01/27/2019 Target Resolution Date: 04/25/2019 Goal Status: Active Interventions: Complete pain assessment as per visit requirements DIONISIO, ARAGONES. ([id number]) Notes: Wound/Skin Impairment Nursing Diagnoses: Impaired tissue integrity Goals: Ulcer/skin breakdown will heal within 14 weeks Date Initiated: 01/27/2019 Target Resolution Date: 04/25/2019 Goal Status: Active Interventions: Assess patient/caregiver ability to obtain necessary supplies Assess patient/caregiver ability to perform ulcer/skin care regimen upon admission and as needed Assess ulceration(s) every visit Notes: Electronic Signature(s) Signed: 03/31/2019 4:26:15 PM By: [name] Entered By: [name] on 03/31/2019 08:38:34 [name] ([id number]) -------------------------------------------------------------------------------- Pain Assessment Details Patient Name: [name], [name] Date of Service: 03/31/2019 8:15 AM Medical Record Number: [id number] Patient Account Number: [id number] Date of Birth/Sex: [birth year] (58 y.o. M) Treating RN: Army Melia Primary Care Noam Karaffa: [name] Other Clinician: Referring Aly Hauser: [name] Treating Santiaga Butzin/Extender: STONE III, HOYT Weeks in Treatment: 9 Active Problems Location of Pain Severity and Description of Pain Patient Has Paino Yes Site Locations Pain Location: Generalized Pain, Pain in  Ulcers Rate the pain. Current Pain Level: 6 Pain Management and Medication Current Pain Management: Electronic Signature(s) Signed: 03/31/2019 9:57:52 AM By: Army Melia Entered By: Army Melia on 03/31/2019 08:24:38 [name] ([id number]) -------------------------------------------------------------------------------- Patient/Caregiver Education Details Patient Name: [name], [name] Date of Service: 03/31/2019 8:15 AM Medical Record Number: [id number] Patient Account Number: [id number] Date of Birth/Gender: 1961/04/01 (58 y.o. M) Treating RN: [name] Primary Care Physician: [name] Other Clinician: Referring Physician: [name] Treating Physician/Extender: Sharalyn Ink in Treatment: 9 Education Assessment Education Provided To: Patient and Caregiver Education Topics Provided Wound/Skin Impairment: Handouts: Other: wound care as ordered Methods: Demonstration, Explain/Verbal Responses: State content correctly Electronic Signature(s) Signed: 03/31/2019 4:26:15 PM By: [name] Entered By: [name] on 03/31/2019 08:50:26 [name] ([id number]) -------------------------------------------------------------------------------- Wound Assessment Details Patient Name: [name], [name] Date of Service: 03/31/2019 8:15 AM Medical Record Number: [id number] Patient Account Number: [id number] Date of Birth/Sex: [birth year] (58 y.o. M) Treating RN: Army Melia Primary Care Analleli Gierke: [name] Other Clinician: Referring Lianni Kanaan: [name] Treating Kerstin Crusoe/Extender: STONE III, HOYT Weeks in Treatment: 9 Wound Status Wound Number: 1 Primary Arterial Insufficiency Ulcer Etiology: Wound Location: Right Amputation Site - Above Knee Secondary Diabetic Wound/Ulcer of the Lower Extremity Wounding Event: Surgical Injury Etiology: Date Acquired: 09/09/2018 Wound Open Weeks Of Treatment: 9 Status: Clustered Wound: No Comorbid  Arrhythmia, Congestive Heart Failure, History: Hypertension, Peripheral Arterial Disease, Type II Diabetes, End Stage Renal Disease Photos Photo Uploaded By: Army Melia on 03/31/2019 09:12:16 Wound Measurements Length: (cm) 3.5 Width: (cm) 5.4 Depth: (cm) 0.2 Area: (cm) 14.844 Volume: (cm) 2.969 % Reduction in Area: 3.1% % Reduction in Volume: 67.7% Epithelialization: Small (1-33%) Tunneling: No Undermining: No Wound Description Full Thickness Without Exposed Support Foul O Classification: Structures Slough Wound Margin: Thickened Exudate Medium Amount: Exudate Type: Serosanguineous Exudate Color: red, brown dor After Cleansing: No /Fibrino Yes Wound Bed Granulation Amount: Medium (34-66%) Exposed Structure Granulation Quality: Red, Hyper-granulation Fascia Exposed: No Necrotic Amount: Medium (34-66%) Fat Layer (Subcutaneous Tissue) Exposed: Yes Necrotic Quality: Adherent Slough Tendon Exposed: No Muscle Exposed: No [place], [place] ([id number]) Joint Exposed: No Bone Exposed: No Treatment Notes Wound #1 (Right Amputation Site - Above Knee) Notes Santyl, saline moistened gauze, ABD secured with tape Electronic Signature(s) Signed: 03/31/2019 9:57:52 AM By: Army Melia Entered By: Army Melia on 03/31/2019 08:27:18 [name] ([id number]) -------------------------------------------------------------------------------- Vitals Details Patient Name: [name], [name] Date of Service: 03/31/2019 8:15 AM Medical Record Number: [id number] Patient Account Number: [id number] Date of Birth/Sex: 1960-12-14 (58 y.o. M) Treating RN: Army Melia Primary Care Miachel Nardelli: [name] Other Clinician: Referring Bowe Sidor: [name] Treating Sunday Klos/Extender: STONE III, HOYT Weeks in Treatment: 9 Vital Signs Time Taken: 08:24 Temperature (F): 98.3 Height (in): 67 Pulse (bpm): 74 Weight (lbs): 220 Respiratory Rate (breaths/min): 16 Body Mass Index (BMI):  34.5 Blood Pressure (mmHg): 113/64 Reference Range: 80 - 120 mg / dl Electronic Signature(s) Signed: 03/31/2019 9:57:52 AM By: Army Melia Entered By: Army Melia on 03/31/2019 08:25:01

## 2019-04-02 NOTE — Progress Notes (Signed)
IKAIKA, SHOWERS ([id number]) Visit Report for 03/31/2019 Chief Complaint Document Details Patient Name: [name], [name] Date of Service: 03/31/2019 8:15 AM Medical Record Number: [id number] Patient Account Number: [id number] Date of Birth/Sex: [birth year] (58 y.o. M) Treating RN: [name] Primary Care Provider: [name] Other Clinician: Referring Provider: [name] Treating Provider/Extender: [name], HOYT Weeks in Treatment: 9 Information Obtained from: Patient Chief Complaint Right AKA Ulcer at surgical incision Electronic Signature(s) Signed: 04/01/2019 6:29:07 AM By: Worthy Keeler PA-C Entered By: Worthy Keeler on 04/01/2019 05:34:28 [name] ([id number]) -------------------------------------------------------------------------------- Debridement Details Patient Name: [name], [name] Date of Service: 03/31/2019 8:15 AM Medical Record Number: [id number] Patient Account Number: [id number] Date of Birth/Sex: 07/06/1961 (58 y.o. M) Treating RN: [name] Primary Care Provider: [name] Other Clinician: Referring Provider: [name] Treating Provider/Extender: [name], HOYT Weeks in Treatment: 9 Debridement Performed for Wound #1 Right Amputation Site - Above Knee Assessment: Performed By: Clinician [name], RN Debridement Type: Chemical/Enzymatic/Mechanical Agent Used: Santyl Severity of Tissue Pre Fat layer exposed Debridement: Level of Consciousness (Pre- Awake and Alert procedure): Pre-procedure Verification/Time Yes - 08:50 Out Taken: Start Time: 08:50 Pain Control: Lidocaine 4% Topical Solution Instrument: Other : tongue blade Bleeding: None End Time: 08:51 Procedural Pain: 0 Post Procedural Pain: 0 Response to Treatment: Procedure was tolerated well Level of Consciousness Awake and Alert (Post-procedure): Post Debridement Measurements of Total Wound Length: (cm) 3.5 Width: (cm) 5.5 Depth: (cm) 0.2 Volume:  (cm) 3.024 Character of Wound/Ulcer Post Debridement: Improved Severity of Tissue Post Debridement: Fat layer exposed Post Procedure Diagnosis Same as Pre-procedure Electronic Signature(s) Signed: 03/31/2019 4:26:15 PM By: [name] Signed: 04/01/2019 6:29:07 AM By: Worthy Keeler PA-C Entered By: [name] on 03/31/2019 08:48:15 [name], [name] ([id number]) -------------------------------------------------------------------------------- HPI Details Patient Name: [name], [name] Date of Service: 03/31/2019 8:15 AM Medical Record Number: [id number] Patient Account Number: [id number] Date of Birth/Sex: 10-09-1961 (58 y.o. M) Treating RN: [name] Primary Care Provider: [name] Other Clinician: Referring Provider: [name] Treating Provider/Extender: [name], HOYT Weeks in Treatment: 9 History of Present Illness HPI Description: 01/27/19 patient presents today for initial evaluation here in our clinic concerning an issue he's been having with a wound at the right above knee amputation site which began shortly following the surgery in November 2019. 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 He has a history of hypertension, diabetes, congestive heart failure, and states that he is having a lot of discomfort at the wound site unfortunately. There does not appear to be any signs of active infection upon initial inspection today. No fevers, chills, nausea, or vomiting noted at this time.. Silvadene Cream has been utilized at some point on this wound as well. Patient did have home health coming out but unfortunately the patient and his wife state that they were not happy with the services being rendered and therefore discontinued the service. There does not appear to be any signs  of otherwise active infection at this time which is good news. The patient states he's definitely ready for this to heal and has been a symptomatic time since his surgery and he is becoming somewhat frustrated. Iodoflex also sounds to have previously been used on the wound that is when the patient's wife states the wound seem to  get worse not better. As best I can tell [name] has not been utilized. 02/03/19 on evaluation today patient appears to be doing much better compared to last week's evaluation in regard to his ulcer on the right amputation site. He's been tolerating the dressing changes without complication which is good news. The Santyl does seem to be of benefit in fact the wound appears to be doing significantly better compared to my prior evaluation. Overall very pleased in this regard. No fevers, chills, nausea, or vomiting noted at this time. 02/10/19 on evaluation today patient's wound actually appears to be showing signs of excellent improvement at this time which is great news. He has been tolerating the dressing changes without complication overall I feel like that there is no signs of active infection at this point which is also good news. I feel like that the [name] is very beneficial. Patient's wife likewise feels like things are doing quite well and is not having pain like he was in the past. 02/17/19 on evaluation today patient appears to be doing well in regard to the wound on the distal aspect of his right amputation site. He's been tolerating treatment with the Santyl at this point. I think he is nearing completion as far as treatment with cental is concerned I think were very close to getting this significantly better. Fortunately there's no evidence of infection currently. 03/03/19 on evaluation today patient actually appears to be doing quite well in regard to his his right imputation site ulceration. In fact there's a lot of new skin growth which is excellent news. I'm very  pleased in this regard. Fortunately there's no signs of active infection also good news. No fevers, chills, nausea, or vomiting noted at this time. 03/17/19 on evaluation today patient's wound actually appears to be showing signs of improvement which is good news. He does have a lot of issues however with concerns about how slowly this seems to be healing as far as he and his wife are concerned. I do believe some of the issues that were having currently are from the language barrier perspective but even that we have an interpreter that has been present for each evaluation I still think that there is some issues with their overall understanding of the treatment plan and how long wounds of this nature do take to heal. We began seeing him on April 14 and in just over a month and a half he has made improvement to where the wound is one third the size originally began. Nonetheless there still quite a bit of feeling left to do but again he has made great progress up to this point. Unfortunately he did have some issues with the silver collagen that was ordered he received something different from the [employer]. This was Colactive. Unfortunately this actually calls some burning he seemed to have some irritation from the dressing itself that caused irritation and redness around the edge of the wound that seems to be improving but nonetheless he and his wife both believe that the Santyl might've done better. They also really want to try to utilize something that will prevent me having to degree the wound as he typically has discomfort with the debridement obviously. 03/31/19 on evaluation today patient actually appears to be doing better in regard to his lower extremity ulcer. He's been tolerating the dressing changes without complication. Fortunately there's no signs of active infection at this time. He has a lot of new skin growth in the Santyl does seem to  be working appropriately for him at this time. I'm very  pleased in this regard. [name], [name] ([id number]) Electronic Signature(s) Signed: 04/01/2019 6:29:07 AM By: Worthy Keeler PA-C Entered By: Worthy Keeler on 04/01/2019 05:34:36 [name] ([id number]) -------------------------------------------------------------------------------- Physical Exam Details Patient Name: [name], [name] Date of Service: 03/31/2019 8:15 AM Medical Record Number: [id number] Patient Account Number: [id number] Date of Birth/Sex: [birth year] (58 y.o. M) Treating RN: [name] Primary Care Provider: [name] Other Clinician: Referring Provider: [name] Treating Provider/Extender: STONE III, HOYT Weeks in Treatment: 9 Constitutional Well-nourished and well-hydrated in no acute distress. Respiratory normal breathing without difficulty. clear to auscultation bilaterally. Cardiovascular regular rate and rhythm with normal S1, S2. Psychiatric this patient is able to make decisions and demonstrates good insight into disease process. Alert and Oriented x 3. pleasant and cooperative. Notes Patient's wound bed currently showed signs of good granulation at this time. There does not appear to be active infection which is good news in the wrong very pleased with the progress that is made up to this point. Electronic Signature(s) Signed: 04/01/2019 6:29:07 AM By: Worthy Keeler PA-C Entered By: Worthy Keeler on 04/01/2019 05:35:09 [name] ([id number]) -------------------------------------------------------------------------------- Physician Orders Details Patient Name: [name], [name] Date of Service: 03/31/2019 8:15 AM Medical Record Number: [id number] Patient Account Number: [id number] Date of Birth/Sex: [birth year] (58 y.o. M) Treating RN: [name] Primary Care Provider: [name] Other Clinician: Referring Provider: [name] Treating Provider/Extender: [name], HOYT Weeks in Treatment: 9 Verbal / Phone Orders:  No Diagnosis Coding ICD-10 Coding Code Description I73.89 Other specified peripheral vascular diseases L97.812 Non-pressure chronic ulcer of other part of right lower leg with fat layer exposed Z89.611 Acquired absence of right leg above knee E11.622 Type 2 diabetes mellitus with other skin ulcer I50.42 Chronic combined systolic (congestive) and diastolic (congestive) heart failure N18.6 End stage renal disease Z99.2 Dependence on renal dialysis I10 Essential (primary) hypertension I48.0 Paroxysmal atrial fibrillation Wound Cleansing Wound #1 Right Amputation Site - Above Knee o Clean wound with Normal Saline. Anesthetic (add to Medication List) Wound #1 Right Amputation Site - Above Knee o Topical Lidocaine 4% cream applied to wound bed prior to debridement (In Clinic Only). Primary Wound Dressing Wound #1 Right Amputation Site - Above Knee o Santyl Ointment - Apply evenly over open wound, nickle thickness. Secondary Dressing Wound #1 Right Amputation Site - Above Knee o ABD pad o Saline moistened gauze Dressing Change Frequency Wound #1 Right Amputation Site - Above Knee o Change dressing every day. Follow-up Appointments Wound #1 Right Amputation Site - Above Knee o Return Appointment in 1 week. Electronic Signature(s) [name], [name] ([id number]) Signed: 03/31/2019 4:26:15 PM By: [name] Signed: 04/01/2019 6:29:07 AM By: Worthy Keeler PA-C Entered By: [name] on 03/31/2019 08:50:02 [name] ([id number]) -------------------------------------------------------------------------------- Problem List Details Patient Name: [name], [name] Date of Service: 03/31/2019 8:15 AM Medical Record Number: [id number] Patient Account Number: [id number] Date of Birth/Sex: 21-Feb-1961 (58 y.o. 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 01/27/2019 No Yes E11.622 Type 2 diabetes mellitus with other skin  ulcer 01/27/2019 No Yes I50.42 Chronic combined systolic (congestive) and diastolic 6/43/3295 No Yes (congestive) heart failure N18.6 End stage renal disease 01/27/2019 No Yes Z99.2 Dependence on renal dialysis 01/27/2019 No Yes I10 Essential (primary) hypertension 01/27/2019 No Yes I48.0 Paroxysmal atrial fibrillation 01/27/2019 No Yes Inactive Problems Resolved Problems SILVESTRE, MINES ([id number]) Electronic Signature(s) Signed: 04/01/2019 6:29:07 AM By: Worthy Keeler PA-C Entered By: Worthy Keeler on 03/31/2019 08:18:50 [name] ([id number]) -------------------------------------------------------------------------------- Progress Note Details Patient Name: [name], [name] Date of Service: 03/31/2019 8:15 AM Medical Record Number: [id number] Patient Account Number: [id number] Date of Birth/Sex: 28-Mar-1961 (58 y.o. M) Treating RN: [name] Primary Care Provider: [name] Other Clinician: Referring Provider: [name] Treating Provider/Extender: [name], HOYT Weeks in Treatment: 9 Subjective Chief Complaint Information obtained from Patient Right AKA Ulcer at surgical incision History of Present Illness (HPI) 01/27/19 patient presents today for initial evaluation here in our clinic concerning an issue he's been having with a wound at the right above knee amputation site which began shortly following the surgery in November 2019. 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 He has a history of hypertension, diabetes, congestive heart failure, and states that he is having a lot of discomfort at the wound site unfortunately. There does not appear to be any signs of active infection upon initial inspection today. No fevers, chills, nausea, or vomiting noted at this time.. Silvadene Cream has been utilized at some point on this wound as well. Patient did have home health coming out but unfortunately the patient and his wife state that they were not happy with the services being rendered and therefore discontinued the service. There does not appear to be any signs of otherwise active infection at this time which is good news. The patient states he's definitely ready for this to heal and has been a symptomatic time since his surgery and he is becoming somewhat frustrated. Iodoflex also sounds to have previously been used on the wound that is when the patient's wife states the wound seem to get worse not better. As best I can tell [name] has not been utilized. 02/03/19 on evaluation today patient appears to be doing much better compared to last week's evaluation in regard to his ulcer on the right amputation site. He's been tolerating the dressing changes without complication which is good news. The Santyl does seem to be of benefit in fact the wound appears to be doing significantly better compared to my prior evaluation. Overall very pleased in this regard. No fevers, chills, nausea, or vomiting noted at this time. 02/10/19 on evaluation today patient's wound actually appears to be showing signs of excellent improvement at this time which is great news. He has been tolerating the dressing changes without complication overall I feel like that there is no signs of active infection at this point which is also good news. I feel like that the [name] is very beneficial.  Patient's wife likewise feels like things are doing quite well and is not having pain like he was in the past. 02/17/19 on evaluation today patient appears to be doing well in regard to the wound on the distal aspect of his right amputation site. He's been tolerating treatment with the Santyl at this point. I think he is nearing completion as  far as treatment with cental is concerned I think were very close to getting this significantly better. Fortunately there's no evidence of infection currently. 03/03/19 on evaluation today patient actually appears to be doing quite well in regard to his his right imputation site ulceration. In fact there's a lot of new skin growth which is excellent news. I'm very pleased in this regard. Fortunately there's no signs of active infection also good news. No fevers, chills, nausea, or vomiting noted at this time. 03/17/19 on evaluation today patient's wound actually appears to be showing signs of improvement which is good news. He does have a lot of issues however with concerns about how slowly this seems to be healing as far as he and his wife are concerned. I do believe some of the issues that were having currently are from the language barrier perspective but even that we have an interpreter that has been present for each evaluation I still think that there is some issues with their overall understanding of the treatment plan and how long wounds of this nature do take to heal. We began seeing him on April 14 and in just over a month and a half he has made improvement to where the wound is one third the size originally began. Nonetheless there still quite a bit of feeling left to do but again he has made great progress up to this point. Unfortunately he did have some issues with the silver collagen that was ordered he received something different from the [employer]. This was Colactive. Unfortunately this actually calls some burning he seemed to have some irritation from  the dressing itself that caused irritation and redness around the edge of the wound that seems to be improving but nonetheless he and his wife both [name], [name] ([id number]) believe that the Santyl might've done better. They also really want to try to utilize something that will prevent me having to degree the wound as he typically has discomfort with the debridement obviously. 03/31/19 on evaluation today patient actually appears to be doing better in regard to his lower extremity ulcer. He's been tolerating the dressing changes without complication. Fortunately there's no signs of active infection at this time. He has a lot of new skin growth in the Santyl does seem to be working appropriately for him at this time. I'm very pleased in this regard. Patient History Information obtained from Patient. Family History Cancer - Father, Diabetes - Siblings, No family history of Hereditary Spherocytosis. Social History Former smoker - ended on 10/16/1994, Marital Status - Married, Alcohol Use - Never, Drug Use - No History, Caffeine Use - Rarely. Medical History Cardiovascular Patient has history of Arrhythmia - A-fib, Congestive Heart Failure, Hypertension, Peripheral Arterial Disease - Bilateral LE amputations Endocrine Patient has history of Type II Diabetes Genitourinary Patient has history of End Stage Renal Disease - Dialysis M,W,F Hospitalization/Surgery History - Right AKA. - Left BKA. 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 Respiratory Denies complaints or symptoms of Chronic or frequent coughs, Shortness of Breath. Cardiovascular Denies complaints or symptoms of Chest pain, LE edema. Psychiatric Denies complaints or symptoms of  Anxiety, Claustrophobia. Objective Constitutional Well-nourished and well-hydrated in no acute distress. Vitals Time Taken: 8:24 AM, Height: 67 in, Weight: 220 lbs, BMI: 34.5, Temperature: 98.3 F, Pulse: 74 bpm,  Respiratory Rate: 16 breaths/min, Blood Pressure: 113/64 mmHg. [name], [name] ([id number]) Respiratory normal breathing without difficulty. clear to auscultation bilaterally. Cardiovascular regular rate and rhythm with normal S1, S2. Psychiatric this patient is able to make decisions and demonstrates good insight into disease process. Alert and Oriented x 3. pleasant and cooperative. General Notes: Patient's wound bed currently showed signs of good granulation at this time. There does not appear to be active infection which is good news in the wrong very pleased with the progress that is made up to this point. Integumentary (Hair, Skin) Wound #1 status is Open. Original cause of wound was Surgical Injury. The wound is located on the Right Amputation Site - Above Knee. The wound measures 3.5cm length x 5.4cm width x 0.2cm depth; 14.844cm^2 area and 2.969cm^3 volume. There is Fat Layer (Subcutaneous Tissue) Exposed exposed. There is no tunneling or undermining noted. There is a medium amount of serosanguineous drainage noted. The wound margin is thickened. There is medium (34-66%) red, hyper - granulation within the wound bed. There is a medium (34-66%) amount of necrotic tissue within the wound bed including Adherent Slough. 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 There was a Chemical/Enzymatic/Mechanical debridement performed by [name], RN. With the following instrument(s): tongue blade after achieving pain control using Lidocaine 4% Topical Solution. Agent used was [employer]. A time out was conducted at 08:50, prior to the start of the procedure. There was no bleeding. The procedure was tolerated well with a pain level of 0 throughout and a pain level of 0 following the procedure. Post Debridement Measurements: 3.5cm length x 5.5cm width x 0.2cm depth; 3.024cm^3 volume. Character of Wound/Ulcer Post Debridement is improved. Severity of Tissue Post Debridement is: Fat layer exposed. Post procedure Diagnosis Wound #1: Same as Pre-Procedure [name], [name] L. ([id number]) Plan Wound Cleansing: Wound #1 Right Amputation Site - Above Knee: Clean wound with Normal Saline. Anesthetic (add to Medication List): Wound #1 Right Amputation Site - Above Knee: Topical Lidocaine 4% cream applied to wound bed prior to debridement (In Clinic Only). Primary Wound Dressing: Wound #1 Right Amputation Site - Above Knee: Santyl Ointment - Apply evenly over open wound, nickle thickness. Secondary Dressing: Wound #1 Right Amputation Site - Above Knee: ABD pad Saline moistened gauze Dressing Change Frequency: Wound #1 Right Amputation Site - Above Knee: Change dressing every day. Follow-up Appointments: Wound #1 Right Amputation Site - Above Knee: Return Appointment in 1 week. My suggestion is gonna be that we continue with the Santyl at this point since this seems to be effective for him to keep in the slough down on the not even have to perform sharp debridement today which is excellent news. We will subsequently see were things stand in one week.  Nothing is worse me that he continues to shows improvement I think I will just continue with the Santyl until closure. Otherwise if anything changes shall contact the office and let me know. Please see above for specific wound care orders. We  will see patient for re-evaluation in 1 week(s) here in the clinic. If anything worsens or changes patient will contact our office for additional recommendations. Electronic Signature(s) Signed: 04/01/2019 6:29:07 AM By: Worthy Keeler PA-C Entered By: Worthy Keeler on 04/01/2019 05:35:20 [name] ([id number]) -------------------------------------------------------------------------------- ROS/PFSH Details Patient Name: [name], Mamie L. Date of Service: 03/31/2019 8:15 AM Medical Record Number: [id number] Patient Account Number: [id number] Date of Birth/Sex: 1961/09/11 (58 y.o. 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 Respiratory Complaints and Symptoms: Negative for: Chronic or frequent coughs; Shortness of Breath Cardiovascular Complaints and Symptoms: Negative for: Chest pain; LE edema Medical History: Positive for: Arrhythmia - A-fib; Congestive Heart Failure; Hypertension; Peripheral Arterial Disease - Bilateral LE amputations Psychiatric Complaints and Symptoms: Negative for: Anxiety; Claustrophobia Endocrine Medical History: Positive for: Type II Diabetes Time with diabetes: 14 years Treated with: Oral agents Blood sugar tested every day: No Blood sugar testing results: Breakfast: 128; Lunch:  145 Genitourinary Medical History: Positive for: End Stage Renal Disease - Dialysis M,W,F Immunizations BENEDICT, KUE. ([id number]) Pneumococcal Vaccine: Received Pneumococcal Vaccination: Yes Implantable Devices None Hospitalization / Surgery History Type of Hospitalization/Surgery Right AKA Left BKA Family and Social History Cancer: Yes - Father; Diabetes: Yes - Siblings; Hereditary Spherocytosis: No; Former smoker - ended on 10/16/1994; Marital Status - Married; Alcohol Use: Never; Drug Use: No History; Caffeine Use: Rarely Physician Affirmation I have reviewed and agree with the above information. Electronic Signature(s) Signed: 04/01/2019 6:29:07 AM By: Worthy Keeler PA-C Signed: 04/01/2019 4:48:03 PM By: [name] Entered By: Worthy Keeler on 04/01/2019 05:34:59 [name] ([id number]) -------------------------------------------------------------------------------- SuperBill Details Patient Name: [name], Bard L. Date of Service: 03/31/2019 Medical Record Number: [id number] Patient Account Number: [id number] Date of Birth/Sex: 1961-06-24 (58 y.o. 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 E11.622 Type 2 diabetes mellitus with other skin ulcer I50.42 Chronic combined systolic (congestive) and diastolic (congestive) heart failure N18.6 End stage renal disease Z99.2 Dependence on renal dialysis I10 Essential (primary) hypertension I48.0 Paroxysmal atrial fibrillation Facility Procedures CPT4 Code: [id number] Description: 88916 - DEBRIDE W/O ANES NON SELECT Modifier: Quantity:  1 Physician Procedures CPT4 Code Description: [id number] 82800 - WC PHYS LEVEL 4 - EST PT ICD-10 Diagnosis Description I73.89 Other specified peripheral vascular diseases L97.812 Non-pressure chronic ulcer of other part of right lower leg w Z89.611 Acquired absence of right leg  above knee E11.622 Type 2 diabetes mellitus with other skin ulcer Modifier: ith fat layer expo Quantity: 1 sed Electronic Signature(s) Signed: 04/01/2019 6:29:07 AM By: Worthy Keeler PA-C Entered By: Worthy Keeler on 04/01/2019 05:35:35

## 2019-04-14 ENCOUNTER — Encounter: Payer: Medicare Other | Admitting: Physician Assistant

## 2019-04-14 ENCOUNTER — Other Ambulatory Visit: Payer: Self-pay

## 2019-04-14 DIAGNOSIS — E11622 Type 2 diabetes mellitus with other skin ulcer: Secondary | ICD-10-CM | POA: Diagnosis not present

## 2019-04-15 NOTE — Progress Notes (Signed)
[name], [name] ([id number]) Visit Report for 04/14/2019 Chief Complaint Document Details Patient Name: DRAE, MITZEL L. Date of Service: 04/14/2019 8:15 AM Medical Record Number: [id number] Patient Account Number: [id number] Date of Birth/Sex: February 11, 1961 (58 y.o. M) Treating RN: [name] Primary Care Provider: [name] Other Clinician: Referring Provider: [name] Treating Provider/Extender: [name], HOYT Weeks in Treatment: 11 Information Obtained from: Patient Chief Complaint Right AKA Ulcer at surgical incision Electronic Signature(s) Signed: 04/15/2019 12:14:37 AM By: Worthy Keeler PA-C Entered By: Worthy Keeler on 04/14/2019 08:06:19 [name] ([id number]) -------------------------------------------------------------------------------- Debridement Details Patient Name: [name], [name] Date of Service: 04/14/2019 8:15 AM Medical Record Number: [id number] Patient Account Number: [id number] Date of Birth/Sex: 02-16-1961 (58 y.o. M) Treating RN: [name] Primary Care Provider: [name] Other Clinician: Referring Provider: [name] Treating Provider/Extender: [name], HOYT Weeks in Treatment: 11 Debridement Performed for Wound #1 Right Amputation Site - Above Knee Assessment: Performed By: Clinician [name], RN Debridement Type: Chemical/Enzymatic/Mechanical Agent Used: Santyl Severity of Tissue Pre Fat layer exposed Debridement: Level of Consciousness (Pre- Awake and Alert procedure): Pre-procedure Verification/Time Yes - 08:40 Out Taken: Start Time: 08:40 Pain Control: Lidocaine 4% Topical Solution Instrument: Other : tongue blade Bleeding: None End Time: 08:41 Procedural Pain: 0 Post Procedural Pain: 0 Response to Treatment: Procedure was tolerated well Level of Consciousness Awake and Alert (Post-procedure): Post Debridement Measurements of Total Wound Length: (cm) 2.7 Width: (cm) 3 Depth: (cm) 0.2 Volume:  (cm) 1.272 Character of Wound/Ulcer Post Debridement: Improved Severity of Tissue Post Debridement: Fat layer exposed Post Procedure Diagnosis Same as Pre-procedure Electronic Signature(s) Signed: 04/14/2019 4:41:12 PM By: [name] Signed: 04/15/2019 12:14:37 AM By: Worthy Keeler PA-C Entered By: [name] on 04/14/2019 08:41:39 [name], [name] ([id number]) -------------------------------------------------------------------------------- HPI Details Patient Name: [name], [name] Date of Service: 04/14/2019 8:15 AM Medical Record Number: [id number] Patient Account Number: [id number] Date of Birth/Sex: Dec 23, 1960 (58 y.o. M) Treating RN: [name] Primary Care Provider: [name] Other Clinician: Referring Provider: [name] Treating Provider/Extender: [name], HOYT Weeks in Treatment: 11 History of Present Illness HPI Description: 01/27/19 patient presents today for initial evaluation here in our clinic concerning an issue he's been having with a wound at the right above knee amputation site which began shortly following the surgery in November 2019. 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 He has a history of hypertension, diabetes, congestive heart failure, and states that he is having a lot of discomfort at the wound site unfortunately. There does not appear to be any signs of active infection upon initial inspection today. No fevers, chills, nausea, or vomiting noted at this time.. Silvadene Cream has been utilized at some point on this wound as well. Patient did have home health coming out but unfortunately the patient and his wife state that they were not happy with the services being rendered and therefore discontinued the service. There does not appear to be any  signs of otherwise active infection at this time which is good news. The patient states he's definitely ready for this to heal and has been a symptomatic time since his surgery and he is becoming somewhat frustrated. Iodoflex also sounds to have previously been used on the wound that is when the patient's wife states the wound seem to  get worse not better. As best I can tell [name] has not been utilized. 02/03/19 on evaluation today patient appears to be doing much better compared to last week's evaluation in regard to his ulcer on the right amputation site. He's been tolerating the dressing changes without complication which is good news. The Santyl does seem to be of benefit in fact the wound appears to be doing significantly better compared to my prior evaluation. Overall very pleased in this regard. No fevers, chills, nausea, or vomiting noted at this time. 02/10/19 on evaluation today patient's wound actually appears to be showing signs of excellent improvement at this time which is great news. He has been tolerating the dressing changes without complication overall I feel like that there is no signs of active infection at this point which is also good news. I feel like that the [name] is very beneficial. Patient's wife likewise feels like things are doing quite well and is not having pain like he was in the past. 02/17/19 on evaluation today patient appears to be doing well in regard to the wound on the distal aspect of his right amputation site. He's been tolerating treatment with the Santyl at this point. I think he is nearing completion as far as treatment with cental is concerned I think were very close to getting this significantly better. Fortunately there's no evidence of infection currently. 03/03/19 on evaluation today patient actually appears to be doing quite well in regard to his his right imputation site ulceration. In fact there's a lot of new skin growth which is excellent news. I'm  very pleased in this regard. Fortunately there's no signs of active infection also good news. No fevers, chills, nausea, or vomiting noted at this time. 03/17/19 on evaluation today patient's wound actually appears to be showing signs of improvement which is good news. He does have a lot of issues however with concerns about how slowly this seems to be healing as far as he and his wife are concerned. I do believe some of the issues that were having currently are from the language barrier perspective but even that we have an interpreter that has been present for each evaluation I still think that there is some issues with their overall understanding of the treatment plan and how long wounds of this nature do take to heal. We began seeing him on April 14 and in just over a month and a half he has made improvement to where the wound is one third the size originally began. Nonetheless there still quite a bit of feeling left to do but again he has made great progress up to this point. Unfortunately he did have some issues with the silver collagen that was ordered he received something different from the [employer]. This was Colactive. Unfortunately this actually calls some burning he seemed to have some irritation from the dressing itself that caused irritation and redness around the edge of the wound that seems to be improving but nonetheless he and his wife both believe that the Santyl might've done better. They also really want to try to utilize something that will prevent me having to degree the wound as he typically has discomfort with the debridement obviously. 03/31/19 on evaluation today patient actually appears to be doing better in regard to his lower extremity ulcer. He's been tolerating the dressing changes without complication. Fortunately there's no signs of active infection at this time. He has a lot of new skin growth in the Santyl does seem to  be working appropriately for him at this time. I'm  very pleased in this regard. [name], [name] ([id number]) 04/14/19 on evaluation today patient appears to be doing quite well in regard to his invitation site ulcer. This is a little smaller even than half the size of where it was when I saw him last. Fortunately there is no sign of active infection at this time which is good news. Overall he has been tolerating the dressing changes without complication still states that he has a lot of pain but again I really cannot see anything with regard to the wound that would be causing this and maybe more for neuropathy type pain. He states his weeks and up at night sometimes. Electronic Signature(s) Signed: 04/15/2019 12:14:37 AM By: Worthy Keeler PA-C Entered By: Worthy Keeler on 04/14/2019 08:43:31 [name] ([id number]) -------------------------------------------------------------------------------- Physical Exam Details Patient Name: Salonga, Kashawn L. Date of Service: 04/14/2019 8:15 AM Medical Record Number: [id number] Patient Account Number: [id number] Date of Birth/Sex: 19-Apr-1961 (58 y.o. M) Treating RN: [name] Primary Care Provider: [name] Other Clinician: Referring Provider: [name] Treating Provider/Extender: STONE III, HOYT Weeks in Treatment: 21 Constitutional Well-nourished and well-hydrated in no acute distress. Respiratory normal breathing without difficulty. clear to auscultation bilaterally. Cardiovascular regular rate and rhythm with normal S1, S2. Psychiatric this patient is able to make decisions and demonstrates good insight into disease process. Alert and Oriented x 3. pleasant and cooperative. Notes Upon inspection today patient's wound bed did not even require any kind of sharp agreement since it seems to be doing so well which is excellent news. Overall very pleased in this regard. Nonetheless I think the [name] is doing a good job of keeping wound service clean and allowing the new skin  growth overall I see no concerns currently. Electronic Signature(s) Signed: 04/15/2019 12:14:37 AM By: Worthy Keeler PA-C Entered By: Worthy Keeler on 04/14/2019 08:45:31 [name] ([id number]) -------------------------------------------------------------------------------- Physician Orders Details Patient Name: [name], Yahmir L. Date of Service: 04/14/2019 8:15 AM Medical Record Number: [id number] Patient Account Number: [id number] Date of Birth/Sex: [birth year] (58 y.o. M) Treating RN: [name] Primary Care Provider: [name] Other Clinician: Referring Provider: [name] Treating Provider/Extender: [name], HOYT Weeks in Treatment: 11 Verbal / Phone Orders: No Diagnosis Coding ICD-10 Coding Code Description I73.89 Other specified peripheral vascular diseases L97.812 Non-pressure chronic ulcer of other part of right lower leg with fat layer exposed Z89.611 Acquired absence of right leg above knee E11.622 Type 2 diabetes mellitus with other skin ulcer I50.42 Chronic combined systolic (congestive) and diastolic (congestive) heart failure N18.6 End stage renal disease Z99.2 Dependence on renal dialysis I10 Essential (primary) hypertension I48.0 Paroxysmal atrial fibrillation Wound Cleansing Wound #1 Right Amputation Site - Above Knee o Clean wound with Normal Saline. Anesthetic (add to Medication List) Wound #1 Right Amputation Site - Above Knee o Topical Lidocaine 4% cream applied to wound bed prior to debridement (In Clinic Only). Primary Wound Dressing Wound #1 Right Amputation Site - Above Knee o Santyl Ointment - Apply evenly over open wound, nickle thickness. Secondary Dressing Wound #1 Right Amputation Site - Above Knee o ABD pad o Saline moistened gauze Dressing Change Frequency Wound #1 Right Amputation Site - Above Knee o Change dressing every day. Follow-up Appointments Wound #1 Right Amputation Site - Above Knee o Return  Appointment in 1 week. Electronic Signature(s) [name], [name] ([id number]) Signed: 04/14/2019 4:41:12 PM By: [name] Signed: 04/15/2019 12:14:37 AM By: Joaquim Lai  III, Hoyt PA-C Entered By: [name] on 04/14/2019 08:41:58 [name] ([id number]) -------------------------------------------------------------------------------- Problem List Details Patient Name: [name], [name] Date of Service: 04/14/2019 8:15 AM Medical Record Number: [id number] Patient Account Number: [id number] Date of Birth/Sex: [birth year] (58 y.o. M) Treating RN: [name] Primary Care Provider: [name] Other Clinician: Referring Provider: [name] Treating Provider/Extender: [name], HOYT Weeks in Treatment: 11 Active Problems ICD-10 Evaluated Encounter Code Description Active Date Today Diagnosis I73.89 Other specified peripheral vascular diseases 01/27/2019 No Yes L97.812 Non-pressure chronic ulcer of other part of right lower leg 01/27/2019 No Yes with fat layer exposed Z89.611 Acquired absence of right leg above knee 01/27/2019 No Yes E11.622 Type 2 diabetes mellitus with other skin ulcer 01/27/2019 No Yes I50.42 Chronic combined systolic (congestive) and diastolic 04/11/3661 No Yes (congestive) heart failure N18.6 End stage renal disease 01/27/2019 No Yes Z99.2 Dependence on renal dialysis 01/27/2019 No Yes I10 Essential (primary) hypertension 01/27/2019 No Yes I48.0 Paroxysmal atrial fibrillation 01/27/2019 No Yes Inactive Problems Resolved Problems PEDRAM, GOODCHILD ([id number]) Electronic Signature(s) Signed: 04/15/2019 12:14:37 AM By: Worthy Keeler PA-C Entered By: Worthy Keeler on 04/14/2019 08:06:11 [name] ([id number]) -------------------------------------------------------------------------------- Progress Note Details Patient Name: [name], Antonio L. Date of Service: 04/14/2019 8:15 AM Medical Record Number: [id number] Patient Account Number: [id number] Date of  Birth/Sex: [birth year] (58 y.o. M) Treating RN: [name] Primary Care Provider: [name] Other Clinician: Referring Provider: [name] Treating Provider/Extender: [name], HOYT Weeks in Treatment: 11 Subjective Chief Complaint Information obtained from Patient Right AKA Ulcer at surgical incision History of Present Illness (HPI) 01/27/19 patient presents today for initial evaluation here in our clinic concerning an issue he's been having with a wound at the right above knee amputation site which began shortly following the surgery in November 2019. 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 He has a history of hypertension, diabetes, congestive heart failure, and states that he is having a lot of discomfort at the wound site unfortunately. There does not appear to be any signs of active infection upon initial inspection today. No fevers, chills, nausea, or vomiting noted at this time.. Silvadene Cream has been utilized at some point on this wound as well. Patient did have home health coming out but unfortunately the patient and his wife state that they were not happy with the services being rendered and therefore discontinued the service. There does not appear to be any signs of otherwise active infection at this time which is good news. The patient states he's definitely ready for this to heal and has been a symptomatic time since his surgery and he is becoming somewhat frustrated. Iodoflex also sounds to have previously been used on the wound that is when the patient's wife states the wound seem to get worse not better. As best I can tell [name] has not been utilized. 02/03/19 on evaluation today patient appears to be doing much better compared to last week's evaluation in regard to his  ulcer on the right amputation site. He's been tolerating the dressing changes without complication which is good news. The Santyl does seem to be of benefit in fact the wound appears to be doing significantly better compared to my prior evaluation. Overall very pleased in this regard. No fevers, chills, nausea, or vomiting noted  at this time. 02/10/19 on evaluation today patient's wound actually appears to be showing signs of excellent improvement at this time which is great news. He has been tolerating the dressing changes without complication overall I feel like that there is no signs of active infection at this point which is also good news. I feel like that the [name] is very beneficial. Patient's wife likewise feels like things are doing quite well and is not having pain like he was in the past. 02/17/19 on evaluation today patient appears to be doing well in regard to the wound on the distal aspect of his right amputation site. He's been tolerating treatment with the Santyl at this point. I think he is nearing completion as far as treatment with cental is concerned I think were very close to getting this significantly better. Fortunately there's no evidence of infection currently. 03/03/19 on evaluation today patient actually appears to be doing quite well in regard to his his right imputation site ulceration. In fact there's a lot of new skin growth which is excellent news. I'm very pleased in this regard. Fortunately there's no signs of active infection also good news. No fevers, chills, nausea, or vomiting noted at this time. 03/17/19 on evaluation today patient's wound actually appears to be showing signs of improvement which is good news. He does have a lot of issues however with concerns about how slowly this seems to be healing as far as he and his wife are concerned. I do believe some of the issues that were having currently are from the language barrier perspective but even that we have an  interpreter that has been present for each evaluation I still think that there is some issues with their overall understanding of the treatment plan and how long wounds of this nature do take to heal. We began seeing him on April 14 and in just over a month and a half he has made improvement to where the wound is one third the size originally began. Nonetheless there still quite a bit of feeling left to do but again he has made great progress up to this point. Unfortunately he did have some issues with the silver collagen that was ordered he received something different from the [employer]. This was Colactive. Unfortunately this actually calls some burning he seemed to have some irritation from the dressing itself that caused irritation and redness around the edge of the wound that seems to be improving but nonetheless he and his wife both [name], [name] ([id number]) believe that the Santyl might've done better. They also really want to try to utilize something that will prevent me having to degree the wound as he typically has discomfort with the debridement obviously. 03/31/19 on evaluation today patient actually appears to be doing better in regard to his lower extremity ulcer. He's been tolerating the dressing changes without complication. Fortunately there's no signs of active infection at this time. He has a lot of new skin growth in the Santyl does seem to be working appropriately for him at this time. I'm very pleased in this regard. 04/14/19 on evaluation today patient appears to be doing quite well in regard to his invitation site ulcer. This is a little smaller even than half the size of where it was when I saw him last. Fortunately there is no sign of active infection at this time which is good news. Overall he has been tolerating the dressing changes without complication still states that he has a lot of  pain but again I really cannot see anything with regard to the wound that would be causing  this and maybe more for neuropathy type pain. He states his weeks and up at night sometimes. Patient History Information obtained from Patient. Family History Cancer - Father, Diabetes - Siblings, No family history of Hereditary Spherocytosis. Social History Former smoker - ended on 10/16/1994, Marital Status - Married, Alcohol Use - Never, Drug Use - No History, Caffeine Use - Rarely. Medical History Cardiovascular Patient has history of Arrhythmia - A-fib, Congestive Heart Failure, Hypertension, Peripheral Arterial Disease - Bilateral LE amputations Endocrine Patient has history of Type II Diabetes Genitourinary Patient has history of End Stage Renal Disease - Dialysis M,W,F Hospitalization/Surgery History - Right AKA. - Left BKA. 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 Respiratory Denies complaints or symptoms of Chronic or frequent coughs, Shortness of Breath. Cardiovascular Denies complaints or symptoms of Chest pain, LE edema. Psychiatric Denies complaints or symptoms of Anxiety, Claustrophobia. Objective [name], [name] ([id number]) Constitutional Well-nourished and well-hydrated in no acute distress. Vitals Time Taken: 8:10 AM, Height: 67 in, Weight: 220 lbs, BMI: 34.5, Temperature: 97.8 F, Pulse: 72 bpm, Respiratory Rate: 18 breaths/min, Blood Pressure: 87/55 mmHg. Respiratory normal breathing without difficulty. clear to auscultation bilaterally. Cardiovascular regular rate and rhythm with normal S1, S2. Psychiatric this patient is able to make decisions and demonstrates good insight into disease process. Alert and Oriented x 3. pleasant and cooperative. General Notes: Upon inspection today patient's wound bed  did not even require any kind of sharp agreement since it seems to be doing so well which is excellent news. Overall very pleased in this regard. Nonetheless I think the [name] is doing a good job of keeping wound service clean and allowing the new skin growth overall I see no concerns currently. Integumentary (Hair, Skin) Wound #1 status is Open. Original cause of wound was Surgical Injury. The wound is located on the Right Amputation Site - Above Knee. The wound measures 2.7cm length x 3cm width x 0.2cm depth; 6.362cm^2 area and 1.272cm^3 volume. There is Fat Layer (Subcutaneous Tissue) Exposed exposed. There is no tunneling or undermining noted. There is a medium amount of serosanguineous drainage noted. The wound margin is thickened. There is medium (34-66%) red, hyper - granulation within the wound bed. There is a medium (34-66%) amount of necrotic tissue within the wound bed including Adherent Slough. 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 ([id number]) Knee .Severity of Tissue Pre Debridement is: Fat layer exposed. There was a Chemical/Enzymatic/Mechanical debridement performed by [name], RN. With the following instrument(s): tongue blade after achieving pain control using Lidocaine 4% Topical Solution. Agent used was [employer]. A time out was conducted at 08:40, prior to the start of the procedure. There was no bleeding. The  procedure was tolerated well with a pain level of 0  throughout and a pain level of 0 following the procedure. Post Debridement Measurements: 2.7cm length x 3cm width x 0.2cm depth; 1.272cm^3 volume. Character of Wound/Ulcer Post Debridement is improved. Severity of Tissue Post Debridement is: Fat layer exposed. Post procedure Diagnosis Wound #1: Same as Pre-Procedure Plan Wound Cleansing: Wound #1 Right Amputation Site - Above Knee: Clean wound with Normal Saline. Anesthetic (add to Medication List): Wound #1 Right Amputation Site - Above Knee: Topical Lidocaine 4% cream applied to wound bed prior to debridement (In Clinic Only). Primary Wound Dressing: Wound #1 Right Amputation Site - Above Knee: Santyl Ointment - Apply evenly over open wound, nickle thickness. Secondary Dressing: Wound #1 Right Amputation Site - Above Knee: ABD pad Saline moistened gauze Dressing Change Frequency: Wound #1 Right Amputation Site - Above Knee: Change dressing every day. Follow-up Appointments: Wound #1 Right Amputation Site - Above Knee: Return Appointment in 1 week. My suggestion is to continue with the Santyl at this time as it seems to be doing so well. Patient is in agreement that plan. We will subsequently see him back for reevaluation next week to see were things stand up anything changes or worsens meantime he will contact the office and let me know. Please see above for specific wound care orders. We will see patient for re-evaluation in 1 week(s) here in the clinic. If anything worsens or changes patient will contact our office for additional recommendations. Electronic Signature(s) Signed: 04/15/2019 12:14:37 AM By: Worthy Keeler PA-C Entered By: Worthy Keeler on 04/14/2019 08:45:57 [name] ([id number]) -------------------------------------------------------------------------------- ROS/PFSH Details Patient Name: [name], [name] Date of Service: 04/14/2019 8:15 AM Medical Record  Number: [id number] Patient Account Number: [id number] Date of Birth/Sex: 1961/04/28 (58 y.o. 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 Respiratory Complaints and Symptoms: Negative for: Chronic or frequent coughs; Shortness of Breath Cardiovascular Complaints and Symptoms: Negative for: Chest pain; LE edema Medical History: Positive for: Arrhythmia - A-fib; Congestive Heart Failure; Hypertension; Peripheral Arterial Disease - Bilateral LE amputations Psychiatric Complaints and Symptoms: Negative for: Anxiety; Claustrophobia Endocrine Medical History: Positive for: Type II Diabetes Time with diabetes: 14 years Treated with: Oral agents Blood sugar tested every day: No Blood sugar testing results: Breakfast: 128; Lunch: 145 Genitourinary Medical History: Positive for: End Stage Renal Disease - Dialysis M,W,F Immunizations [name], FLINCHUM. ([id number]) Pneumococcal Vaccine: Received Pneumococcal Vaccination: Yes Implantable Devices None Hospitalization / Surgery History Type of Hospitalization/Surgery Right AKA Left BKA Family and Social History Cancer: Yes - Father; Diabetes: Yes - Siblings; Hereditary Spherocytosis: No; Former smoker - ended on 10/16/1994; Marital Status - Married; Alcohol Use: Never; Drug Use: No History; Caffeine Use: Rarely Physician Affirmation I have reviewed and agree with the above information. Electronic Signature(s) Signed: 04/14/2019 4:41:12 PM By: [name] Signed: 04/15/2019 12:14:37 AM By: Worthy Keeler  PA-C Entered By: Worthy Keeler on 04/14/2019 08:44:57 [name] ([id number]) -------------------------------------------------------------------------------- SuperBill Details Patient Name: [name], Sage L. Date of Service: 04/14/2019 Medical Record Number: [id number] Patient Account Number: [id number] Date of Birth/Sex: [birth year] (58 y.o. 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 E11.622 Type 2 diabetes mellitus with other skin ulcer I50.42 Chronic combined systolic (congestive) and diastolic (congestive) heart failure N18.6 End stage renal disease Z99.2 Dependence on renal dialysis I10 Essential (primary) hypertension I48.0 Paroxysmal atrial fibrillation Facility Procedures CPT4 Code: 56256389 Description: 37342 - DEBRIDE W/O ANES NON SELECT Modifier: Quantity: 1 Physician Procedures CPT4 Code Description: 8768115 72620 - WC PHYS LEVEL 4 - EST PT ICD-10 Diagnosis Description I73.89 Other specified peripheral vascular diseases L97.812 Non-pressure chronic ulcer of other part of right lower leg w Z89.611 Acquired absence of right leg  above knee E11.622 Type 2 diabetes mellitus with other skin ulcer Modifier: ith fat layer expo Quantity: 1 sed Electronic Signature(s) Signed: 04/15/2019 12:14:37 AM By: Worthy Keeler PA-C Entered By: Worthy Keeler on 04/14/2019 08:46:13

## 2019-04-15 NOTE — Progress Notes (Signed)
PRESTEN, JOOST ([id number]) Visit Report for 04/14/2019 Arrival Information Details Patient Name: [name] Date of Service: 04/14/2019 8:15 AM Medical Record Number: [id number] Patient Account Number: [id number] Date of Birth/Sex: 1961-09-29 (58 y.o. M) Treating RN: Harold Barban Primary Care Trusten Hume: [name] Other Clinician: Referring Karolyne Timmons: [name] Treating Veronica Fretz/Extender: [name], HOYT Weeks in Treatment: 11 Visit Information History Since Last Visit Added or deleted any medications: No Patient Arrived: Wheel Chair Any new allergies or adverse reactions: No Arrival Time: 08:13 Had a fall or experienced change in No Accompanied By: wife activities of daily living that may affect Transfer Assistance: None risk of falls: Patient Identification Verified: Yes Signs or symptoms of abuse/neglect since last visito No Secondary Verification Process Completed: Yes Hospitalized since last visit: No Patient Has Alerts: Yes Has Dressing in Place as Prescribed: Yes Patient Alerts: Type II Diabetic Pain Present Now: Yes Aspirin 81mg  Electronic Signature(s) Signed: 04/14/2019 3:07:20 PM By: Harold Barban Entered By: Harold Barban on 04/14/2019 08:13:58 [name] ([id number]) -------------------------------------------------------------------------------- Encounter Discharge Information Details Patient Name: [name], [name] Date of Service: 04/14/2019 8:15 AM Medical Record Number: [id number] Patient Account Number: [id number] Date of Birth/Sex: 07/26/1961 (58 y.o. M) Treating RN: [name] Primary Care Alyse Kathan: [name] Other Clinician: Referring Cherae Marton: [name] Treating Khalfani Weideman/Extender: [name], HOYT Weeks in Treatment: 11 Encounter Discharge Information Items Post Procedure Vitals Discharge Condition: Stable Temperature (F): 97.8 Ambulatory Status: Wheelchair Pulse (bpm): 72 Discharge Destination: Home Respiratory  Rate (breaths/min): 16 Transportation: Private Auto Blood Pressure (mmHg): 87/55 Accompanied By: spouse Schedule Follow-up Appointment: Yes Clinical Summary of Care: Electronic Signature(s) Signed: 04/14/2019 8:46:04 AM By: [name] Entered By: [name] on 04/14/2019 08:46:04 Drumheller, [name] ([id number]) -------------------------------------------------------------------------------- Lower Extremity Assessment Details Patient Name: [name], [name] Date of Service: 04/14/2019 8:15 AM Medical Record Number: [id number] Patient Account Number: [id number] Date of Birth/Sex: 1961-03-01 (58 y.o. M) Treating RN: Harold Barban Primary Care Dorianne Perret: [name] Other Clinician: Referring Mehr Depaoli: [name] Treating Jahziah Simonin/Extender: [name], HOYT Weeks in Treatment: 11 Electronic Signature(s) Signed: 04/14/2019 3:07:20 PM By: Harold Barban Entered By: Harold Barban on 04/14/2019 08:18:43 [name] ([id number]) -------------------------------------------------------------------------------- Multi Wound Chart Details Patient Name: [name], [name] Date of Service: 04/14/2019 8:15 AM Medical Record Number: [id number] Patient Account Number: [id number] Date of Birth/Sex: 1961-07-13 (58 y.o. M) Treating RN: [name] Primary Care Bettejane Leavens: [name] Other Clinician: Referring Arilyn Brierley: [name] Treating Cadence Haslam/Extender: STONE III, HOYT Weeks in Treatment: 11 Vital Signs Height(in): 67 Pulse(bpm): 72 Weight(lbs): 220 Blood Pressure(mmHg): 87/55 Body Mass Index(BMI): 34 Temperature(F): 97.8 Respiratory Rate 18 (breaths/min): Photos: [N/A:N/A] Wound Location: Right Amputation Site - Above N/A N/A Knee Wounding Event: Surgical Injury N/A N/A Primary Etiology: Arterial Insufficiency Ulcer N/A N/A Secondary Etiology: Diabetic Wound/Ulcer of the N/A N/A Lower Extremity Comorbid History: Arrhythmia, Congestive Heart N/A N/A Failure,  Hypertension, Peripheral Arterial Disease, Type II Diabetes, End Stage Renal Disease Date Acquired: 09/09/2018 N/A N/A Weeks of Treatment: 11 N/A N/A Wound Status: Open N/A N/A Measurements L x W x D 2.7x3x0.2 N/A N/A (cm) Area (cm) : 6.362 N/A N/A Volume (cm) : 1.272 N/A N/A % Reduction in Area: 58.50% N/A N/A % Reduction in Volume: 86.20% N/A N/A Classification: Full Thickness Without N/A N/A Exposed Support Structures Exudate Amount: Medium N/A N/A Exudate Type: Serosanguineous N/A N/A Exudate Color: red, brown N/A N/A Wound Margin: Thickened N/A N/A Granulation Amount: Medium (34-66%) N/A N/A Granulation Quality: Red, Hyper-granulation N/A N/A Necrotic Amount: Medium (34-66%)  N/A N/A [name], [name] Exposed Structures: Fat Layer (Subcutaneous N/A N/A Tissue) Exposed: Yes Fascia: No Tendon: No Muscle: No Joint: No Bone: No Epithelialization: Small (1-33%) N/A N/A Treatment Notes Electronic Signature(s) Signed: 04/14/2019 4:41:12 PM By: [name] Entered By: [name] on 04/14/2019 08:37:22 [name] ([id number]) -------------------------------------------------------------------------------- [place] Details Patient Name: [name], [name] Date of Service: 04/14/2019 8:15 AM Medical Record Number: [id number] Patient Account Number: [id number] Date of Birth/Sex: 11/18/1960 (58 y.o. M) Treating RN: [name] Primary Care Nonnie Pickney: [name] Other Clinician: Referring Tyrece Vanterpool: [name] Treating Amora Sheehy/Extender: [name], HOYT Weeks in Treatment: 11 Active Inactive Abuse / Safety / Falls / Self Care Management Nursing Diagnoses: Impaired physical mobility Goals: Patient will not develop complications from immobility Date Initiated: 01/27/2019 Target Resolution Date: 04/25/2019 Goal Status: Active Interventions: Assess fall risk on admission and as needed Notes: Orientation to the Wound Care  Program Nursing Diagnoses: Knowledge deficit related to the wound healing center program Goals: Patient/caregiver will verbalize understanding of the [facility] Program Date Initiated: 01/27/2019 Target Resolution Date: 04/25/2019 Goal Status: Active Interventions: Provide education on orientation to the wound center Notes: Pain, Acute or Chronic Nursing Diagnoses: Pain, acute or chronic: actual or potential Goals: Patient will verbalize adequate pain control and receive pain control interventions during procedures as needed Date Initiated: 01/27/2019 Target Resolution Date: 04/25/2019 Goal Status: Active Interventions: Complete pain assessment as per visit requirements VALMORE, ARABIE. ([id number]) Notes: Wound/Skin Impairment Nursing Diagnoses: Impaired tissue integrity Goals: Ulcer/skin breakdown will heal within 14 weeks Date Initiated: 01/27/2019 Target Resolution Date: 04/25/2019 Goal Status: Active Interventions: Assess patient/caregiver ability to obtain necessary supplies Assess patient/caregiver ability to perform ulcer/skin care regimen upon admission and as needed Assess ulceration(s) every visit Notes: Electronic Signature(s) Signed: 04/14/2019 4:41:12 PM By: [name] Entered By: [name] on 04/14/2019 08:37:14 Nathaniel, [name] ([id number]) -------------------------------------------------------------------------------- Pain Assessment Details Patient Name: [name], [name] Date of Service: 04/14/2019 8:15 AM Medical Record Number: [id number] Patient Account Number: [id number] Date of Birth/Sex: 1961/10/03 (58 y.o. M) Treating RN: Harold Barban Primary Care Joeline Freer: [name] Other Clinician: Referring Clayton Bosserman: [name] Treating Deyci Gesell/Extender: STONE III, HOYT Weeks in Treatment: 11 Active Problems Location of Pain Severity and Description of Pain Patient Has Paino Yes Site Locations Pain Location: Pain in Ulcers With  Dressing Change: Yes Rate the pain. Current Pain Level: 5 Pain Management and Medication Current Pain Management: Electronic Signature(s) Signed: 04/14/2019 3:07:20 PM By: Harold Barban Entered By: Harold Barban on 04/14/2019 08:14:54 [name] ([id number]) -------------------------------------------------------------------------------- Patient/Caregiver Education Details Patient Name: [name], Geovanie L. Date of Service: 04/14/2019 8:15 AM Medical Record Number: [id number] Patient Account Number: [id number] Date of Birth/Gender: 09-30-1961 (58 y.o. M) Treating RN: [name] Primary Care Physician: [name] Other Clinician: Referring Physician: [name] Treating Physician/Extender: Sharalyn Ink in Treatment: 11 Education Assessment Education Provided To: Patient and Caregiver Education Topics Provided Wound/Skin Impairment: Handouts: Other: wound care s ordered Methods: Demonstration, Explain/Verbal Responses: State content correctly Electronic Signature(s) Signed: 04/14/2019 4:41:12 PM By: [name] Entered By: [name] on 04/14/2019 08:46:47 Waltman, [name] ([id number]) -------------------------------------------------------------------------------- Wound Assessment Details Patient Name: [name], [name] Date of Service: 04/14/2019 8:15 AM Medical Record Number: [id number] Patient Account Number: [id number] Date of Birth/Sex: [birth year] (58 y.o. M) Treating RN: Harold Barban Primary Care Thad Osoria: [name] Other Clinician: Referring Clemon Devaul: [name] Treating Shantel Helwig/Extender: STONE III, HOYT Weeks in Treatment: 11 Wound Status Wound Number: 1 Primary Arterial Insufficiency  Ulcer Etiology: Wound Location: Right Amputation Site - Above Knee Secondary Diabetic Wound/Ulcer of the Lower Extremity Wounding Event: Surgical Injury Etiology: Date Acquired: 09/09/2018 Wound Open Weeks Of Treatment: 11 Status: Clustered  Wound: No Comorbid Arrhythmia, Congestive Heart Failure, History: Hypertension, Peripheral Arterial Disease, Type II Diabetes, End Stage Renal Disease Photos Wound Measurements Length: (cm) 2.7 Width: (cm) 3 Depth: (cm) 0.2 Area: (cm) 6.362 Volume: (cm) 1.272 % Reduction in Area: 58.5% % Reduction in Volume: 86.2% Epithelialization: Small (1-33%) Tunneling: No Undermining: No Wound Description Full Thickness Without Exposed Support Foul O Classification: Structures Slough Wound Margin: Thickened Exudate Medium Amount: Exudate Type: Serosanguineous Exudate Color: red, brown dor After Cleansing: No /Fibrino Yes Wound Bed Granulation Amount: Medium (34-66%) Exposed Structure Granulation Quality: Red, Hyper-granulation Fascia Exposed: No Necrotic Amount: Medium (34-66%) Fat Layer (Subcutaneous Tissue) Exposed: Yes Necrotic Quality: Adherent Slough Tendon Exposed: No Muscle Exposed: No Joint Exposed: No Godinho, [place] ([id number]) Bone Exposed: No Treatment Notes Wound #1 (Right Amputation Site - Above Knee) Notes Santyl, saline moistened gauze, ABD secured with tape Electronic Signature(s) Signed: 04/14/2019 3:07:20 PM By: Harold Barban Entered By: Harold Barban on 04/14/2019 08:18:22 [name] ([id number]) -------------------------------------------------------------------------------- Vitals Details Patient Name: [name], [name] Date of Service: 04/14/2019 8:15 AM Medical Record Number: [id number] Patient Account Number: [id number] Date of Birth/Sex: 1961/04/15 (58 y.o. M) Treating RN: Harold Barban Primary Care Jet Traynham: [name] Other Clinician: Referring Esbeidy Mclaine: [name] Treating Desare Duddy/Extender: STONE III, HOYT Weeks in Treatment: 11 Vital Signs Time Taken: 08:10 Temperature (F): 97.8 Height (in): 67 Pulse (bpm): 72 Weight (lbs): 220 Respiratory Rate (breaths/min): 18 Body Mass Index (BMI): 34.5 Blood Pressure (mmHg):  87/55 Reference Range: 80 - 120 mg / dl Electronic Signature(s) Signed: 04/14/2019 3:07:20 PM By: Harold Barban Entered By: Harold Barban on 04/14/2019 08:19:49

## 2019-04-28 ENCOUNTER — Encounter: Payer: Medicare Other | Attending: Internal Medicine | Admitting: Internal Medicine

## 2019-04-28 ENCOUNTER — Other Ambulatory Visit: Payer: Self-pay

## 2019-04-28 DIAGNOSIS — E11622 Type 2 diabetes mellitus with other skin ulcer: Secondary | ICD-10-CM | POA: Insufficient documentation

## 2019-04-28 DIAGNOSIS — L97812 Non-pressure chronic ulcer of other part of right lower leg with fat layer exposed: Secondary | ICD-10-CM | POA: Insufficient documentation

## 2019-04-28 DIAGNOSIS — E1151 Type 2 diabetes mellitus with diabetic peripheral angiopathy without gangrene: Secondary | ICD-10-CM | POA: Insufficient documentation

## 2019-04-28 DIAGNOSIS — Z6834 Body mass index (BMI) 34.0-34.9, adult: Secondary | ICD-10-CM | POA: Diagnosis not present

## 2019-04-28 DIAGNOSIS — Z8673 Personal history of transient ischemic attack (TIA), and cerebral infarction without residual deficits: Secondary | ICD-10-CM | POA: Insufficient documentation

## 2019-04-28 DIAGNOSIS — E1122 Type 2 diabetes mellitus with diabetic chronic kidney disease: Secondary | ICD-10-CM | POA: Insufficient documentation

## 2019-04-28 DIAGNOSIS — Z992 Dependence on renal dialysis: Secondary | ICD-10-CM | POA: Insufficient documentation

## 2019-04-28 DIAGNOSIS — E669 Obesity, unspecified: Secondary | ICD-10-CM | POA: Diagnosis not present

## 2019-04-28 DIAGNOSIS — I5042 Chronic combined systolic (congestive) and diastolic (congestive) heart failure: Secondary | ICD-10-CM | POA: Insufficient documentation

## 2019-04-28 DIAGNOSIS — I480 Paroxysmal atrial fibrillation: Secondary | ICD-10-CM | POA: Diagnosis not present

## 2019-04-28 DIAGNOSIS — Y835 Amputation of limb(s) as the cause of abnormal reaction of the patient, or of later complication, without mention of misadventure at the time of the procedure: Secondary | ICD-10-CM | POA: Diagnosis not present

## 2019-04-28 DIAGNOSIS — Z89611 Acquired absence of right leg above knee: Secondary | ICD-10-CM | POA: Insufficient documentation

## 2019-04-28 DIAGNOSIS — Z87891 Personal history of nicotine dependence: Secondary | ICD-10-CM | POA: Insufficient documentation

## 2019-04-28 DIAGNOSIS — N186 End stage renal disease: Secondary | ICD-10-CM | POA: Diagnosis not present

## 2019-04-28 DIAGNOSIS — Z89512 Acquired absence of left leg below knee: Secondary | ICD-10-CM | POA: Diagnosis not present

## 2019-04-28 DIAGNOSIS — T8189XA Other complications of procedures, not elsewhere classified, initial encounter: Secondary | ICD-10-CM | POA: Insufficient documentation

## 2019-04-28 DIAGNOSIS — I132 Hypertensive heart and chronic kidney disease with heart failure and with stage 5 chronic kidney disease, or end stage renal disease: Secondary | ICD-10-CM | POA: Insufficient documentation

## 2019-04-28 NOTE — Progress Notes (Signed)
[name], [name] ([id number]) Visit Report for 04/28/2019 HPI Details Patient Name: [name], [name] Date of Service: 04/28/2019 9:00 AM Medical Record Number: [id number] Patient Account Number: [id number] Date of Birth/Sex: 10/21/1960 (57 y.o. 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 He has a history of hypertension, diabetes, congestive heart failure, and states that he is having a lot of discomfort at the wound site unfortunately. There does not appear to be any signs of active infection upon initial inspection today. No fevers, chills, nausea, or vomiting noted at this time.. Silvadene Cream has been utilized at some point on this wound as well. Patient did have home health coming out but unfortunately the patient and his wife state that they were not happy with the services being rendered and therefore discontinued the service. There does not appear to be any signs of otherwise active infection at this time which is good news. The patient states he's definitely ready for this to heal and has been a symptomatic time since his surgery and he is becoming somewhat frustrated. Iodoflex also sounds to have previously been used on the wound that is when the patient's wife states the wound seem to get worse not better. As best I can tell [name] has not been  utilized. 02/03/19 on evaluation today patient appears to be doing much better compared to last week's evaluation in regard to his ulcer on the right amputation site. He's been tolerating the dressing changes without complication which is good news. The Santyl does seem to be of benefit in fact the wound appears to be doing significantly better compared to my prior evaluation. Overall very pleased in this regard. No fevers, chills, nausea, or vomiting noted at this time. 02/10/19 on evaluation today patient's wound actually appears to be showing signs of excellent improvement at this time which is great news. He has been tolerating the dressing changes without complication overall I feel like that there is no signs of active infection at this point which is also good news. I feel like that the [name] is very beneficial. Patient's wife likewise feels like things are doing quite well and is not having pain like he was in the past. 02/17/19 on evaluation today patient appears to be doing well in regard to the wound on the distal aspect of his right amputation site. He's been tolerating treatment with the Santyl at this point. I think he is nearing completion as far as treatment with cental is concerned I think were very close to getting this significantly better. Fortunately there's no evidence of infection currently. 03/03/19 on evaluation today patient actually appears to be doing quite well in regard to his his right imputation site ulceration. In fact there's a lot of new skin growth which is excellent news. I'm very pleased in this regard. Fortunately there's no signs of active infection also good news. No fevers, chills, nausea, or vomiting noted at this time. 03/17/19 on  evaluation today patient's wound actually appears to be showing signs of improvement which is good news. He does have a lot of issues however with concerns about how slowly this seems to be healing as far as he and his wife  are concerned. I do believe some of the issues that were having currently are from the language barrier perspective but even that we have an interpreter that has been present for each evaluation I still think that there is some issues with their overall understanding of the treatment plan and how long wounds of this nature do take to heal. We began seeing him on April 14 and in just over a month and a half he has made improvement to where the wound is one third the size originally began. Nonetheless there still quite a bit of feeling left to do but again he has made great progress up to this point. Unfortunately he did have some issues with the silver collagen that was ordered he received something different from the [employer]. This was Colactive. Unfortunately this actually calls some burning he seemed to have some irritation from the dressing itself that caused irritation and redness around the edge of the wound that seems to be improving but nonetheless he and his wife both believe that the Santyl might've done better. They also really want to try to utilize something that will prevent me having to degree the wound as he typically has discomfort with the debridement obviously. Kristopher, [name] ([id number]) 03/31/19 on evaluation today patient actually appears to be doing better in regard to his lower extremity ulcer. He's been tolerating the dressing changes without complication. Fortunately there's no signs of active infection at this time. He has a lot of new skin growth in the Santyl does seem to be working appropriately for him at this time. I'm very pleased in this regard. 04/14/19 on evaluation today patient appears to be doing quite well in regard to his invitation site ulcer. This is a little smaller even than half the size of where it was when I saw him last. Fortunately there is no sign of active infection at this time which is good news. Overall he has been tolerating the dressing changes  without complication still states that he has a lot of pain but again I really cannot see anything with regard to the wound that would be causing this and maybe more for neuropathy type pain. He states his weeks and up at night sometimes. 04/28/19-Patient presents at 2 weeks, with the right AKA wound, we have been using Santyl for this and there was mention of changing it to a different dressing this time around. Overall patient appears to be improving Electronic Signature(s) Signed: 04/28/2019 9:20:33 AM By: Tobi Bastos Entered By: Tobi Bastos on 04/28/2019 09:20:32 [name] ([id number]) -------------------------------------------------------------------------------- Physical Exam Details Patient Name: Aponte, [name] Date of Service: 04/28/2019 9:00 AM Medical Record Number: [id number] Patient Account Number: [id number] Date of Birth/Sex: 09-22-1961 (57 y.o. M) Treating RN: [name] Primary Care Provider: Royetta [name] Other Clinician: Referring Provider: Royetta [name] Treating Provider/Extender: [name] in Treatment: 13 Constitutional alert and oriented x 3. sitting or standing blood pressure is within target range for patient.. supine blood pressure is within target range for patient.. pulse regular and within target range for patient.Marland Kitchen respirations regular, non-labored and within target range for patient.Marland Kitchen temperature within target range for patient.. . . Well-nourished and well-hydrated in no acute distress. Notes The wound consisting of 2 separate entities  on the right AKA stump is looking good with healthy base surrounding skin appears intact Electronic Signature(s) Signed: 04/28/2019 9:21:08 AM By: Tobi Bastos Entered By: Tobi Bastos on 04/28/2019 09:21:08 [name] ([id number]) -------------------------------------------------------------------------------- Physician Orders Details Patient Name: [name], [name] Date of Service:  04/28/2019 9:00 AM Medical Record Number: [id number] Patient Account Number: [id number] Date of Birth/Sex: 1961/09/19 (57 y.o. M) Treating RN: [name] Primary Care Provider: Royetta [name] Other Clinician: Referring Provider: Royetta [name] Treating Provider/Extender: [name] in Treatment: 66 Verbal / Phone Orders: No Diagnosis Coding Wound Cleansing Wound #1 Right Amputation Site - Above Knee o Clean wound with Normal Saline. Anesthetic (add to Medication List) Wound #1 Right Amputation Site - Above Knee o Topical Lidocaine 4% cream applied to wound bed prior to debridement (In Clinic Only). Primary Wound Dressing Wound #1 Right Amputation Site - Above Knee o Silver Collagen Secondary Dressing Wound #1 Right Amputation Site - Above Knee o ABD pad Dressing Change Frequency Wound #1 Right Amputation Site - Above Knee o Change dressing every other day. Follow-up Appointments Wound #1 Right Amputation Site - Above Knee o Return Appointment in 2 weeks. Electronic Signature(s) Signed: 04/28/2019 10:21:11 AM By: Tobi Bastos Signed: 04/28/2019 4:20:53 PM By: Harold Barban Entered By: Harold Barban on 04/28/2019 09:30:09 [name] ([id number]) -------------------------------------------------------------------------------- Progress Note Details Patient Name: Manigo, [name] Date of Service: 04/28/2019 9:00 AM Medical Record Number: [id number] Patient Account Number: [id number] Date of Birth/Sex: 1961/07/18 (57 y.o. 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 He has a history of hypertension, diabetes, congestive heart failure, and states that he is having a lot of discomfort at the wound site unfortunately. There does not appear to be any signs of active infection upon initial inspection today. No fevers, chills, nausea, or vomiting noted at this time.. Silvadene Cream has been utilized at some point on this wound as well. Patient did have home health coming out but unfortunately the patient and his wife state that they were not happy with the services being rendered and therefore discontinued the service. There does not appear to be any signs of otherwise active infection at this time which is good news. The patient states he's definitely ready for this to heal and has been a symptomatic time since his surgery and he is becoming somewhat frustrated. Iodoflex also sounds to have previously been used on the wound that is when the patient's wife states the wound seem to get worse not better. As best I can tell [name] has not been utilized. 02/03/19 on evaluation today patient appears to be doing much better compared to last week's evaluation in regard to his ulcer on the right amputation site. He's been tolerating the dressing changes without complication which is good news. The Santyl does seem to be of benefit in fact the wound appears to be doing significantly better compared to my prior evaluation. Overall very pleased  in this regard. No fevers, chills, nausea, or vomiting noted at this time. 02/10/19 on evaluation today patient's wound actually appears to be showing signs of excellent improvement at this time which is great news. He has been tolerating the dressing changes without complication  overall I feel like that there is no signs of active infection at this point which is also good news. I feel like that the [name] is very beneficial. Patient's wife likewise feels like things are doing quite well and is not having pain like he was in the past. 02/17/19 on evaluation today patient appears to be doing well in regard to the wound on the distal aspect of his right amputation site. He's been tolerating treatment with the Santyl at this point. I think he is nearing completion as far as treatment with cental is concerned I think were very close to getting this significantly better. Fortunately there's no evidence of infection currently. 03/03/19 on evaluation today patient actually appears to be doing quite well in regard to his his right imputation site ulceration. In fact there's a lot of new skin growth which is excellent news. I'm very pleased in this regard. Fortunately there's no signs of active infection also good news. No fevers, chills, nausea, or vomiting noted at this time. 03/17/19 on evaluation today patient's wound actually appears to be showing signs of improvement which is good news. He does have a lot of issues however with concerns about how slowly this seems to be healing as far as he and his wife are concerned. I do believe some of the issues that were having currently are from the language barrier perspective but even that we have an interpreter that has been present for each evaluation I still think that there is some issues with their overall understanding of the treatment plan and how long wounds of this nature do take to heal. We began seeing him on April 14 and in just over a month and a half he has made improvement to where the wound is one third the size originally began. Nonetheless there still quite a bit of feeling left to do but again he has made great progress up to this point. Unfortunately he did have some issues with the silver collagen that was ordered he  received something different from the [employer]. This was Colactive. Unfortunately this actually calls some burning he seemed to have some irritation from the dressing itself that caused irritation and redness around the edge of the wound that seems to be improving but nonetheless he and his wife both believe that the Santyl might've done better. They also really want to try to utilize something that will prevent me having to degree the wound as he typically has discomfort with the debridement obviously. 03/31/19 on evaluation today patient actually appears to be doing better in regard to his lower extremity ulcer. He's been tolerating the dressing changes without complication. Fortunately there's no signs of active infection at this time. He has a lot [place], [place] ([id number]) of new skin growth in the Santyl does seem to be working appropriately for him at this time. I'm very pleased in this regard. 04/14/19 on evaluation today patient appears to be doing quite well in regard to his invitation site ulcer. This is a little smaller even than half the size of where it was when I saw him last. Fortunately there is no sign of active infection at this time which is good news. Overall he has been tolerating the dressing  changes without complication still states that he has a lot of pain but again I really cannot see anything with regard to the wound that would be causing this and maybe more for neuropathy type pain. He states his weeks and up at night sometimes. 04/28/19-Patient presents at 2 weeks, with the right AKA wound, we have been using Santyl for this and there was mention of changing it to a different dressing this time around. Overall patient appears to be improving Objective Constitutional alert and oriented x 3. sitting or standing blood pressure is within target range for patient.. supine blood pressure is within target range for patient.. pulse regular and within target range for patient.Marland Kitchen  respirations regular, non-labored and within target range for patient.Marland Kitchen temperature within target range for patient.. Well-nourished and well-hydrated in no acute distress. Vitals Time Taken: 8:55 AM, Height: 67 in, Weight: 220 lbs, BMI: 34.5, Temperature: 97.8 F, Pulse: 78 bpm, Respiratory Rate: 18 breaths/min, Blood Pressure: 100/62 mmHg. General Notes: The wound consisting of 2 separate entities on the right AKA stump is looking good with healthy base surrounding skin appears intact Integumentary (Hair, Skin) Wound #1 status is Open. Original cause of wound was Surgical Injury. The wound is located on the Right Amputation Site - Above Knee. The wound measures 2.1cm length x 5.3cm width x 0.2cm depth; 8.741cm^2 area and 1.748cm^3 volume. There is Fat Layer (Subcutaneous Tissue) Exposed exposed. There is no tunneling or undermining noted. There is a medium amount of serosanguineous drainage noted. The wound margin is thickened. There is medium (34-66%) red, hyper - granulation within the wound bed. There is a medium (34-66%) amount of necrotic tissue within the wound bed including Adherent Slough. Plan Wound Cleansing: Wound #1 Right Amputation Site - Above Knee: Clean wound with Normal Saline. Anesthetic (add to Medication List): Wound #1 Right Amputation Site - Above Knee: Topical Lidocaine 4% cream applied to wound bed prior to debridement (In Clinic Only). Primary Wound Dressing: Wound #1 Right Amputation Site - Above Knee: Silver Collagen Schweikert, Asiah L. ([id number]) Secondary Dressing: Wound #1 Right Amputation Site - Above Knee: ABD pad Dressing Change Frequency: Wound #1 Right Amputation Site - Above Knee: Change dressing every other day. Follow-up Appointments: Wound #1 Right Amputation Site - Above Knee: Return Appointment in 1 week. 1. We will change her dressing to silver collagen 2. Return to clinic in 2 weeks 3. Overall progress appears to be in the right  direction Electronic Signature(s) Signed: 04/28/2019 9:21:44 AM By: Tobi Bastos Entered By: Tobi Bastos on 04/28/2019 09:21:44 [name] ([id number]) -------------------------------------------------------------------------------- [place] Details Patient Name: [name], [name] Date of Service: 04/28/2019 Medical Record Number: [id number] Patient Account Number: [id number] Date of Birth/Sex: 1961/06/15 (58 y.o. 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

## 2019-04-29 NOTE — Progress Notes (Signed)
KAIROS, PANETTA ([id number]) Visit Report for 04/28/2019 Arrival Information Details Patient Name: [name], [name] Date of Service: 04/28/2019 9:00 AM Medical Record Number: [id number] Patient Account Number: [id number] Date of Birth/Sex: Feb 14, 1961 (58 y.o. M) Treating RN: Harold Barban Primary Care [place] Basham: [name] Other Clinician: Referring Sherrod Toothman: [name] Treating Ramie Palladino/Extender: Beverly Gust in Treatment: 13 Visit Information History Since Last Visit Added or deleted any medications: No Patient Arrived: Wheel Chair Any new allergies or adverse reactions: No Arrival Time: 08:54 Had a fall or experienced change in No Accompanied By: wife activities of daily living that may affect Transfer Assistance: None risk of falls: Patient Identification Verified: Yes Signs or symptoms of abuse/neglect since last visito No Secondary Verification Process Completed: Yes Hospitalized since last visit: No Patient Has Alerts: Yes Has Dressing in Place as Prescribed: Yes Patient Alerts: Type II Diabetic Pain Present Now: No Aspirin 81mg  Electronic Signature(s) Signed: 04/28/2019 4:20:53 PM By: Harold Barban Entered By: Harold Barban on 04/28/2019 08:55:00 [name] ([id number]) -------------------------------------------------------------------------------- Clinic Level of Care Assessment Details Patient Name: [name] Date of Service: 04/28/2019 9:00 AM Medical Record Number: [id number] Patient Account Number: [id number] Date of Birth/Sex: 09-18-1961 (58 y.o. M) Treating RN: [name] Primary Care Sharisa Toves: [name] Other Clinician: Referring Tiyon Sanor: [name] Treating Ivee Poellnitz/Extender: Beverly Gust in Treatment: 13 Clinic Level of Care Assessment Items TOOL 4 Quantity Score []  - Use when only an EandM is performed on FOLLOW-UP visit 0 ASSESSMENTS - Nursing Assessment / Reassessment X - Reassessment of  Co-morbidities (includes updates in patient status) 1 10 X- 1 5 Reassessment of Adherence to Treatment Plan ASSESSMENTS - Wound and Skin Assessment / Reassessment X - Simple Wound Assessment / Reassessment - one wound 1 5 []  - 0 Complex Wound Assessment / Reassessment - multiple wounds []  - 0 Dermatologic / Skin Assessment (not related to wound area) ASSESSMENTS - Focused Assessment []  - Circumferential Edema Measurements - multi extremities 0 []  - 0 Nutritional Assessment / Counseling / Intervention []  - 0 Lower Extremity Assessment (monofilament, tuning fork, pulses) []  - 0 Peripheral Arterial Disease Assessment (using hand held doppler) ASSESSMENTS - Ostomy and/or Continence Assessment and Care []  - Incontinence Assessment and Management 0 []  - 0 Ostomy Care Assessment and Management (repouching, etc.) PROCESS - Coordination of Care X - Simple Patient / Family Education for ongoing care 1 15 []  - 0 Complex (extensive) Patient / Family Education for ongoing care X- 1 10 Staff obtains Programmer, systems, Records, Test Results / Process Orders []  - 0 Staff telephones HHA, Nursing Homes / Clarify orders / etc []  - 0 Routine Transfer to another Facility (non-emergent condition) []  - 0 Routine Hospital Admission (non-emergent condition) []  - 0 New Admissions / Biomedical engineer / Ordering NPWT, Apligraf, etc. []  - 0 Emergency Hospital Admission (emergent condition) X- 1 10 Simple Discharge Coordination ZAYED, GRIFFIE. ([id number]) []  - 0 Complex (extensive) Discharge Coordination PROCESS - Special Needs []  - Pediatric / Minor Patient Management 0 []  - 0 Isolation Patient Management []  - 0 Hearing / Language / Visual special needs []  - 0 Assessment of Community assistance (transportation, D/C planning, etc.) []  - 0 Additional assistance / Altered mentation []  - 0 Support Surface(s) Assessment (bed, cushion, seat, etc.) INTERVENTIONS - Wound Cleansing / Measurement X -  Simple Wound Cleansing - one wound 1 5 []  - 0 Complex Wound Cleansing - multiple wounds X- 1 5 Wound Imaging (photographs - any number of wounds) []  - 0  Wound Tracing (instead of photographs) X- 1 5 Simple Wound Measurement - one wound []  - 0 Complex Wound Measurement - multiple wounds INTERVENTIONS - Wound Dressings X - Small Wound Dressing one or multiple wounds 1 10 []  - 0 Medium Wound Dressing one or multiple wounds []  - 0 Large Wound Dressing one or multiple wounds []  - 0 Application of Medications - topical []  - 0 Application of Medications - injection INTERVENTIONS - Miscellaneous []  - External ear exam 0 []  - 0 Specimen Collection (cultures, biopsies, blood, body fluids, etc.) []  - 0 Specimen(s) / Culture(s) sent or taken to Lab for analysis []  - 0 Patient Transfer (multiple staff / Civil Service fast streamer / Similar devices) []  - 0 Simple Staple / Suture removal (25 or less) []  - 0 Complex Staple / Suture removal (26 or more) []  - 0 Hypo / Hyperglycemic Management (close monitor of Blood Glucose) []  - 0 Ankle / Brachial Index (ABI) - do not check if billed separately X- 1 5 Vital Signs Jernberg, Lucero L. ([id number]) Has the patient been seen at the hospital within the last three years: Yes Total Score: 85 Level Of Care: New/Established - Level 3 Electronic Signature(s) Signed: 04/28/2019 4:43:58 PM By: [name] Entered By: [name] on 04/28/2019 09:14:32 [name] ([id number]) -------------------------------------------------------------------------------- Encounter Discharge Information Details Patient Name: [name], Fay L. Date of Service: 04/28/2019 9:00 AM Medical Record Number: [id number] Patient Account Number: [id number] Date of Birth/Sex: 1961-09-10 (58 y.o. M) Treating RN: [name] Primary Care Adriena Manfre: [name] Other Clinician: Referring Valor Turber[place]: [name] Treating Joann Jorge/Extender: Beverly Gust in Treatment:  13 Encounter Discharge Information Items Discharge Condition: Stable Ambulatory Status: Wheelchair Discharge Destination: Home Transportation: Private Auto Accompanied By: wife Schedule Follow-up Appointment: Yes Clinical Summary of Care: Electronic Signature(s) Signed: 04/28/2019 4:43:58 PM By: [name] Entered By: [name] on 04/28/2019 09:16:10 [name] ([id number]) -------------------------------------------------------------------------------- Lower Extremity Assessment Details Patient Name: Rucci, Baine L. Date of Service: 04/28/2019 9:00 AM Medical Record Number: [id number] Patient Account Number: [id number] Date of Birth/Sex: [birth year] (57 y.o. M) Treating RN: Harold Barban Primary Care Damion Kant: [name] Other Clinician: Referring Divine Hansley: [name] Treating Suann Klier/Extender: Beverly Gust in Treatment: 13 Electronic Signature(s) Signed: 04/28/2019 4:20:53 PM By: Harold Barban Entered By: Harold Barban on 04/28/2019 09:06:57 [name] ([id number]) -------------------------------------------------------------------------------- Multi Wound Chart Details Patient Name: Wronski, Cru L. Date of Service: 04/28/2019 9:00 AM Medical Record Number: [id number] Patient Account Number: [id number] Date of Birth/Sex: [birth year] (57 y.o. 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 Comorbid History: Arrhythmia, Congestive Heart N/A N/A Failure, Hypertension, Peripheral Arterial Disease, Type II Diabetes, End Stage Renal Disease Date Acquired: 09/09/2018 N/A N/A Weeks of Treatment: 13 N/A N/A Wound Status: Open N/A N/A Measurements L x W x D 2.1x5.3x0.2 N/A N/A (cm) Area (cm) : 8.741 N/A N/A Volume (cm) : 1.748 N/A N/A % Reduction in Area: 42.90% N/A N/A % Reduction in Volume: 81.00% N/A N/A Classification: Full Thickness Without N/A N/A Exposed Support Structures Exudate Amount: Medium N/A N/A Exudate Type: Serosanguineous N/A N/A Exudate  Color: red, brown N/A N/A Wound Margin: Thickened N/A N/A Granulation Amount: Medium (34-66%) N/A N/A Granulation Quality: Red, Hyper-granulation N/A N/A Necrotic Amount: Medium (34-66%) N/A N/A Deats, Renell L. ([id number]) Exposed Structures: Fat Layer (Subcutaneous N/A N/A Tissue) Exposed: Yes Fascia: No Tendon: No Muscle: No Joint: No Bone: No Epithelialization: Small (1-33%) N/A N/A Treatment Notes Electronic Signature(s) Signed: 04/28/2019 4:43:58 PM By: [name] Entered By: [name] on 04/28/2019 09:11:12 [name] ([id number]) -------------------------------------------------------------------------------- [place] Details Patient Name: [name], Leigh L. Date of Service: 04/28/2019 9:00 AM Medical Record Number: [id number] Patient Account Number: [id number] Date of Birth/Sex: February 09, 1961 (57 y.o. M) Treating RN: [name] Primary Care Ruhaan Nordahl: [name] Other Clinician: Referring Jet Armbrust: [name] Treating Keyri Salberg/Extender: Beverly Gust in Treatment: 55 Active Inactive Abuse / Safety / Falls / Self Care Management Nursing Diagnoses: Impaired physical mobility Goals: Patient will not develop complications from immobility Date Initiated: 01/27/2019 Target Resolution Date: 04/25/2019 Goal Status: Active Interventions: Assess fall  risk on admission and as needed Notes: Orientation to the Wound Care Program Nursing Diagnoses: Knowledge deficit related to the wound healing center program Goals: Patient/caregiver will verbalize understanding of the [facility] Date Initiated: 01/27/2019 Target Resolution Date: 04/25/2019 Goal Status: Active Interventions: Provide education on orientation to the wound center Notes: Pain, Acute or Chronic Nursing Diagnoses: Pain, acute or chronic: actual or potential Goals: Patient will verbalize adequate pain control and receive pain control interventions during procedures as needed Date Initiated: 01/27/2019 Target Resolution Date: 04/25/2019 Goal Status: Active Interventions: Complete pain assessment as per visit requirements CLAUDE, WALDMAN. ([id number]) Notes: Wound/Skin Impairment Nursing Diagnoses: Impaired tissue integrity Goals: Ulcer/skin breakdown will heal within 14 weeks Date Initiated: 01/27/2019 Target Resolution Date: 04/25/2019 Goal Status: Active Interventions: Assess patient/caregiver ability to obtain necessary supplies Assess patient/caregiver ability to perform ulcer/skin care regimen upon admission and as needed Assess ulceration(s) every visit Notes: Electronic Signature(s) Signed: 04/28/2019 4:43:58 PM By: [name] Entered By: [name] on 04/28/2019 09:10:59 [name] ([id number]) -------------------------------------------------------------------------------- Pain Assessment Details Patient Name: [name], Windell L. Date of Service: 04/28/2019 9:00 AM Medical Record Number: [id number] Patient Account Number: [id number] Date of Birth/Sex: August 14, 1961 (57 y.o. M) Treating RN: Harold Barban Primary Care Almina Schul: [name] Other Clinician: Referring Velora Horstman: [name] Treating Marlow Berenguer/Extender: Beverly Gust in Treatment: 13 Active Problems Location of Pain Severity and Description of Pain Patient  Has Paino No Site Locations Pain Management and Medication Current Pain Management: Electronic Signature(s) Signed: 04/28/2019 4:20:53 PM By: Harold Barban Entered By: Harold Barban on 04/28/2019 08:55:06 [name] ([id number]) -------------------------------------------------------------------------------- Patient/Caregiver Education Details Patient Name: [name], Avant L. Date of Service: 04/28/2019 9:00 AM Medical Record Number: [id number] Patient Account Number: [id number] Date of Birth/Gender: 01/18/1961 (57 y.o. M) Treating RN: [name] Primary Care Physician: [name] Other Clinician: Referring Physician: [name] Treating Physician/Extender: Beverly Gust in Treatment: 13 Education Assessment Education Provided To: Patient and Caregiver Education Topics Provided Wound/Skin Impairment: Handouts: Other: wound care as ordered Methods: Demonstration, Explain/Verbal Responses: State content correctly Electronic Signature(s) Signed: 04/28/2019 4:43:58 PM By: [name] Entered By: [name] on 04/28/2019 09:15:33 [name] ([id number]) -------------------------------------------------------------------------------- Wound Assessment Details Patient Name: Rumble, Damein L. Date of Service: 04/28/2019 9:00 AM Medical Record Number: [id number] Patient Account Number: [id number] Date of Birth/Sex: March 16, 1961 (57 y.o. 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 Wounding Event: Surgical Injury Etiology: Date Acquired: 09/09/2018 Wound Open Weeks Of Treatment: 13 Status: Clustered Wound: No Comorbid Arrhythmia,  Congestive Heart Failure, History: Hypertension, Peripheral Arterial Disease, Type II Diabetes, End Stage Renal Disease Photos Wound Measurements Length: (cm) 2.1 Width: (cm) 5.3 Depth: (cm) 0.2 Area: (cm) 8.741 Volume: (cm) 1.748 % Reduction in Area: 42.9% % Reduction in Volume: 81% Epithelialization: Small (1-33%) Tunneling: No Undermining: No Wound Description Full Thickness Without Exposed Support Foul O Classification: Structures Slough Wound Margin: Thickened Exudate Medium Amount: Exudate Type: Serosanguineous Exudate Color: red, brown dor After Cleansing: No /Fibrino Yes Wound Bed Granulation Amount: Medium (34-66%) Exposed Structure Granulation Quality: Red, Hyper-granulation Fascia Exposed: No Necrotic Amount: Medium (34-66%) Fat Layer (Subcutaneous Tissue) Exposed: Yes Necrotic Quality: Adherent Slough Tendon Exposed: No Muscle Exposed: No Joint Exposed: No Harnois, [place] ([id number]) Bone Exposed: No Treatment Notes Wound #1 (Right Amputation Site - Above Knee) Notes prisma, ABD secured with tape Electronic Signature(s) Signed: 04/28/2019 4:20:53 PM By: Harold Barban Entered By: Harold Barban on 04/28/2019 09:06:42 [name] ([id number]) -------------------------------------------------------------------------------- Vitals Details Patient Name: [name], Cora L. Date of Service: 04/28/2019 9:00 AM Medical Record Number: [id number] Patient Account Number: [id number] Date of Birth/Sex: [birth year] (57 y.o. M) Treating RN: Harold Barban Primary Care Avril Busser: [name] Other Clinician: Referring Nailyn Dearinger: [name] Treating Dyon Rotert/Extender: Beverly Gust in Treatment: 13 Vital Signs Time Taken: 08:55 Temperature (F): 97.8 Height (in): 67 Pulse (bpm): 78 Weight (lbs): 220 Respiratory Rate (breaths/min): 18 Body Mass Index (BMI): 34.5 Blood Pressure (mmHg): 100/62 Reference Range: 80 - 120 mg / dl Electronic  Signature(s) Signed: 04/28/2019 4:20:53 PM By: Harold Barban Entered By: Harold Barban on 04/28/2019 09:01:56

## 2019-05-04 ENCOUNTER — Ambulatory Visit: Payer: Medicare Other | Attending: Family Medicine | Admitting: Physical Therapy

## 2019-05-04 ENCOUNTER — Other Ambulatory Visit: Payer: Self-pay

## 2019-05-04 DIAGNOSIS — M6281 Muscle weakness (generalized): Secondary | ICD-10-CM | POA: Diagnosis present

## 2019-05-04 DIAGNOSIS — R262 Difficulty in walking, not elsewhere classified: Secondary | ICD-10-CM | POA: Insufficient documentation

## 2019-05-04 NOTE — Therapy (Signed)
[place] MAIN [facility] SERVICES [address] [place], [place], [address] Phone: [phone]   Fax:  [phone]  Physical Therapy Evaluation  Patient Details  Name: [name] MRN: [id number] Date of Birth: 31-Dec-1960 No data recorded  Encounter Date: 05/04/2019  PT End of Session - 05/04/19 1043    Visit Number  1    PT Start Time  2563    PT Stop Time  1200    PT Time Calculation (min)  80 min       Past Medical History:  Diagnosis Date  . (HFpEF) heart failure with preserved ejection fraction ([place])    a. 2017 EF per LHC 30-35%; b. 09/2018 Echo: EF 55-60%, no rwma, Gr2 DD, mild MR, mildly dil LA. PASP 91mHg. small to mod circumferential pericardial eff, no tamponade.  . Anemia of chronic disease   . Atherosclerosis   . CAD (coronary artery disease)    a. 2017 LHC, severe 1v disease, occluded pRCA, L-R collaterals, moderate LV dysfunction EF 30-35.  . Diabetes mellitus with complication ([place]   . End stage renal disease on dialysis ([place]    Labile BP  . History of ventricular tachycardia    a. 2017 polymorphic VT and cardiac arrest.  . Hypercholesteremia   . Hyperlipidemia LDL goal <70   . Hypertension   . Labile blood pressure    on HD  . NSVT (nonsustained ventricular tachycardia) ([place]    08/2018 NSVT  . PAF (paroxysmal atrial fibrillation) ([place]    a. 08/2018 CHA2DS2VASc at least 6-->initially placed on eliquis but d/c'd 2/2 anemia. On Amio.  .Marland KitchenPeripheral arterial occlusive disease (HCC)    s/p L BKA, R AKA  . PONV (postoperative nausea and vomiting)   . S/P AKA (above knee amputation) unilateral, right ([place]    08/2018  . S/P BKA (below knee amputation) unilateral, left ([place]    2015  . Stroke ([facility]     Past Surgical History:  Procedure Laterality Date  . A/V FISTULAGRAM Left 01/09/2017   Procedure: A/V Fistulagram;  Surgeon: JAlgernon Huxley MD;  [place] [place];  Service: Cardiovascular;  Laterality:  Left;  . A/V FISTULAGRAM Left 06/06/2017   Procedure: A/V Fistulagram;  Surgeon: DAlgernon Huxley MD;  [place];  Service: Cardiovascular;  Laterality: Left;  . A/V FISTULAGRAM Left 08/15/2017   Procedure: A/V Fistulagram;  Surgeon: DAlgernon Huxley MD;  [place] [facility];  Service: Cardiovascular;  Laterality: Left;  . A/V FISTULAGRAM Left 01/22/2018   Procedure: A/V FISTULAGRAM;  Surgeon: DAlgernon Huxley MD;  [place] [facility];  Service: Cardiovascular;  Laterality: Left;  . A/V SHUNT INTERVENTION N/A 06/06/2017   Procedure: A/V SHUNT INTERVENTION;  Surgeon: DAlgernon Huxley MD;  [place];  Service: Cardiovascular;  Laterality: N/A;  . AMPUTATION Right 04/25/2016   Procedure: TOE AMPUTATION;  Surgeon: GKatha Cabal MD;  Location: [facility] ORS;  Service: Vascular;  Laterality: Right;  . AMPUTATION Right 09/09/2018   Procedure: AMPUTATION ABOVE KNEE;  Surgeon: DAlgernon Huxley MD;  Location: [facility] ORS;  Service: Vascular;  Laterality: Right;  . APPLICATION OF WOUND VAC Right 04/25/2016   Procedure: APPLICATION OF WOUND VAC;  Surgeon: GKatha Cabal MD;  Location: [facility] ORS;  Service: Vascular;  Laterality: Right;  . APPLICATION OF WOUND VAC Right 12/18/2018   Procedure: APPLICATION OF WOUND VAC;  Surgeon: DAlgernon Huxley MD;  Location:  [facility] ORS;  Service: Vascular;  Laterality: Right;  wound vac  . AV FISTULA PLACEMENT Left 2015  . CARDIAC CATHETERIZATION N/A 04/30/2016   Procedure: Left Heart Cath and Coronary Angiography;  Surgeon: Wellington Hampshire, MD;  Location: [place] CV LAB;  Service: Cardiovascular;  Laterality: N/A;  . CATARACT EXTRACTION, BILATERAL    . LEG AMPUTATION BELOW KNEE Left   . LOWER EXTREMITY ANGIOGRAPHY Right 07/24/2018   Procedure: LOWER EXTREMITY ANGIOGRAPHY;  Surgeon: [name], MD;  Location: [place] CV LAB;  Service: Cardiovascular;  Laterality: Right;  . LOWER EXTREMITY ANGIOGRAPHY Right 08/13/2018    Procedure: LOWER EXTREMITY ANGIOGRAPHY;  Surgeon: [name], MD;  Location: [place] CV LAB;  Service: Cardiovascular;  Laterality: Right;  . LOWER EXTREMITY ANGIOGRAPHY Right 09/04/2018   Procedure: Lower Extremity Angiography;  Surgeon: [name], MD;  Location: [place] [facility] LAB;  Service: Cardiovascular;  Laterality: Right;  . PERIPHERAL VASCULAR CATHETERIZATION N/A 10/03/2015   Procedure: A/V Shuntogram/Fistulagram;  Surgeon: [name], MD;  Location: [place] CV LAB;  Service: Cardiovascular;  Laterality: N/A;  . PERIPHERAL VASCULAR CATHETERIZATION N/A 01/16/2016   Procedure: Abdominal Aortogram w/Lower Extremity;  Surgeon: [name], MD;  Location: [place] CV LAB;  Service: Cardiovascular;  Laterality: N/A;  . PERIPHERAL VASCULAR CATHETERIZATION  01/16/2016   Procedure: Lower Extremity Intervention;  Surgeon: [name], MD;  Location: [place] CV LAB;  Service: Cardiovascular;;  . PERIPHERAL VASCULAR CATHETERIZATION Right 04/27/2016   Procedure: Lower Extremity Angiography;  Surgeon: Katha Cabal, MD;  Location: [place] CV LAB;  Service: Cardiovascular;  Laterality: Right;  . PERIPHERAL VASCULAR CATHETERIZATION  04/27/2016   Procedure: Lower Extremity Intervention;  Surgeon: Katha Cabal, MD;  Location: [place] CV LAB;  Service: Cardiovascular;;  . TRANSMETATARSAL AMPUTATION Right 04/28/2016   Procedure: TRANSMETATARSAL AMPUTATION;  Surgeon: Albertine Patricia, DPM;  Location: [facility] OR[facility];  Service: Podiatry;  Laterality: Right;  . WOUND DEBRIDEMENT Right 04/25/2016   Procedure: DEBRIDEMENT WOUND;  Surgeon: Katha Cabal, MD;  Location: [facility] OR[facility];  Service: Vascular;  Laterality: Right;  . WOUND DEBRIDEMENT Right 12/18/2018   Procedure: DEBRIDEMENT WOUND;  Surgeon: [name], MD;  Location: [facility] OR[facility];  Service: Vascular;  Laterality: Right;  right stump    There were no vitals filed for this visit.   Subjective Assessment - 05/04/19 1042     Subjective  Patient has a manula wc and is needing a power wc    Patient is accompained by:  Family member;Interpreter    Pertinent History  otto is the interpreter, Patient has B AKA dx.         PATIENT INFORMATION: This Evaluation form will serve as the LMN for the following suppliers:  Supplier:[employer]  Contact Person:Josh Cadle Phone:[phone]  Reason for Referral:Power WC Patient/caregiver Goals: to be able to perform household mobility and ADL's Patient was seen for face-to-face evaluation for new power wheelchair.  Also present was  Josh Cadel                       to discuss recommendations and wheelchair options.  Further paperwork was completed and sent to vendor.  Patient appears to qualify for power mobility device at this time per objective findings.   MEDICAL HISTORY:AKA L June 2015, R AKA 09/09/2018, Diabetes, dialysis 3 x /week, CVA 2008, PVD,  Diagnosis:PVD, Diabetes, ESRD Primary Diagnosis Onset:4/ 2008 ESRD  _0 Progressive Disease Relevant  Past and Future Surgeries:03/2014 l AKA, R AKA 09/09/2018 Height:5 , 7" Weight:220 lbs Explain and recent changes or trends in weight:   Relevant History including falls:No       HOME ENVIRONMENT: _0 House  _1 Condo/town home  _2 Apartment  _3 Assisted Living :lives in Mobile home  _4 Lives Alone _5  Lives with Others                                                    Hours with caregiver: No  _6 Home is accessible to patient            Stairs  _7 Yes _8  No     Ramp _9 Yes _10 No Comments:     COMMUNITY ADL: TRANSPORTATION: _11 Car    _12 Van    <[id number]>_9<\/XIPJASNKNLZJQBHA>_19 Public Transportation    _14 Adapted w/c Lift   _15 Ambulance   _16 Other:       _17 Sits in wheelchair during transport  Employment/School:     Specific requirements pertaining to mobility                                                     Other:      NA                                 FUNCTIONAL/SENSORY PROCESSING SKILLS:  Handedness:   _18 Right     _19 Left    _20 NA   Comments:                                 Functional Processing Skills for Wheeled Mobility _21 Processing Skills are adequate for safe wheelchair operation  Areas of concern than may interfere with safe operation of wheelchair Description of problem   _22  Attention to environment     _23 Judgment     _24  Hearing  _25  Vision or visual processing    _26 Motor Planning  _27  Fluctuations in Behavior                                                   VERBAL COMMUNICATION: _28 WFL receptive _29  WFL expressive _30 Understandable  _31 Difficult to understand  _32 non-communicative _33  Uses an augmented communication device    CURRENT SEATING / MOBILITY: Current Mobility Base:   _34 None  _35 Dependent  _36 Manual  _37 Scooter  _38 Power   Type of Control:                       Manufacturer:  Drive sport 2                        Size:   18 x 16                      Age:                           Current Condition of  Mobility Base:  Arms tapped, brakes don't lock,no cushion,                                                                                                                  Current Wheelchair components:   L swing away leg                                                                                                                                Describe posture in present seating system:    Not deep enough                                                                        SENSATION and SKIN ISSUES: Sensation _0 Intact _1 Impaired _2 Absent   Level of sensation:                           Pressure Relief: Able to perform effective pressure relief :   _3 Yes  _4  No Method:    Unable to push up on arms and raise his bottom                                                                          If not, Why?:   BUE weakness and overall weakness from dialysis                                                                       Skin Issues/Skin Integrity Current Skin Issues   _5 Yes  _6 No  _7 Intact _8  Red area ( buttocks) _9  Open Area ( right stump) _10 Scar Tissue _11 At risk from prolonged sitting  Where  Bottocks,  Right stump residual limb                          History of Skin Issues   _0 Yes _1 No  Where    R stump   , buttocks                                   When   08/2018  R stump                   10/2018 buttocks                       Hx of skin flap surgeries _2 Yes _3 No  Where   L AKA               When   02/2014                                               Limited sitting tolerance _4 Yes _5 No Hours spent sitting in wheelchair daily:                                       7-8 hours                   Complaint of Pain:  Please describe:   Buttocks pain ,R residual limb pain                                                                                                          Swelling/Edema:  NA                                                                                                                                                ADL STATUS (in reference to wheelchair use):  Indep Assist Unable Indep with Equip Not assessed Comments  Dressing                           x  Eating     x                                                                                                                         Toileting                               x                                                                                                Bathing                x                                                                                                                      Grooming/ Hygiene                 x                                                                                                             Meal Prep                              x  IADLS                    x                                                                                               Bowel Management: _0 Continent  _1 Incontinent  _2 Accidents Comments:                                                  Bladder Management: _3 Continent  _4 Incontinent  _5 Accidents Comments:                                     NA , dialysis         WHEELCHAIR SKILLS: Manual w/c Propulsion:unable to propel wc due to decreased UE strenght _6 UE or LE strength and endurance sufficient to participate in ADLs using manual wheelchair Arm :  _7 left _8 right  _9 Both                                   Foot:   _10 left _11 right  _12 Both  Distance:   Operate Scooter: _13  Strength, hand grip, balance and transfer appropriate for use _14 Living environment is accessible for use of scooter  Operate Power w/c:  _15  Std. Joystick   _16  Alternative Controls Indep _17  Assist _18  Dependent/ Unable _19  N/A _20  _21 Safe          _22  Functional      Distance:                Bed confined without wheelchair _23  Yes _24  No   STRENGTH/RANGE OF MOTION:  Range of Motion Strength  Shoulder          WFL                                                                                                 4/5  Elbow                     WFL                                        4/5  Wrist/Hand                                                              WFL                  4/5                                                         Hip                        WFL                                                                                                  NT  Knee              NA                                                                                                            NA  Ankle NA                                                                 NA      MOBILITY/BALANCE:  _0  Patient is totally dependent for mobility                                                                                                Balance Transfers Ambulation  Sitting Balance: Standing Balance: _1  Independent _2  Independent/Modified Independent  _3  WFL     _4  WFL _5  Supervision _6  Supervision  _7  Uses UE for balance  _8  Supervision _9  Min Assist _10   Ambulates with Assist                           _0  Min Assist _1  Min assist _2  Mod Assist _3  Ambulates with Device:  _4  RW   _5  StW   _6  Cane   _7                 _8  Mod Assist _9  Mod assist _10  Max assist   _11  Max Assist _12  Max assist _13  Dependent _14  Indep. Short Distance Only  _15  Unable _16  Unable _17  Lift / Sling Required Distance (in feet)                             _18  Sliding board _19  Unable to Ambulate: (Explain:L AKA with prosthesis, R AKA without prosthesis, Patient is not able to take any steps or stand up at all. He uses his L prosthesis for balance stability during transfers using sliding board due to decreased sitting balance.    Cardio Status:  _20 Intact  _21  Impaired   _22  NA                              Respiratory Status:  _23 Intact   _24 Impaired   _25 NA                                     Orthotics/Prosthetics:   L LE prosthesis                                                                       Comments (Address manual vs power w/c vs scooter):    Patient needs power wc to be able to propel inside his home                                                          Anterior / Posterior Obliquity Rotation-Pelvis  PELVIS    _26 Neutral  _27  Posterior  _28  Anterior     _29 WFL  _30 Right Elevated  _31 Left Elevated   _32 WFL  _33 Right Anterior _34   Left Anterior    _35  Fixed _36  Partly Flexible _37  Flexible  _38  Other  _39  Fixed  _40  Partly Flexible  _41  Flexible _42  Other  _43  Fixed  _44  Partly Flexible  _45  Flexible _46  Other  TRUNK _47 WFL _48 Thoracic Kyphosis _49 Lumbar Lordosis   _50  WFL _51 Convex Right _52 Convex Left   _53 c-curve _54 s-curve _55 multiple  _56  Neutral _57  Left-anterior _58  Right-anterior    _59  Fixed _60  Flexible _61   Partly Flexible       Other  _62  Fixed _63  Flexible _64  Partly Flexible _65  Other  _66  Fixed           _67  Flexible _68  Partly Flexible _69  Other   Position Windswept   HIPS  _70  Neutral _71  Abduct _72  ADduct _73  Neutral _74  Right _75  Left       _76   Fixed  _0  Partly Flexible             _1  Dislocated _2  Flexible _3  Subluxed    _4  Fixed _5  Partly Flexible  _6  Flexible _7  Other              Foot Positioning Knee Positioning   Knees and  Feet  _8  WFL _9 Left _10 Right _11  WFL _12 Left _13 Right   KNEES ROM concerns: ROM concerns:   & Dorsi-Flexed                    _14 Lt _15 Rt                                  FEET Plantar Flexed                  _16 Lt _17 Rt     Inversion                    _18 Lt _19 Rt     Eversion                    _20 Lt _21 Rt    HEAD _22  Functional _23  Good Head Control   & _24  Flexed         _25  Extended _26  Adequate Head Control   NECK _27  Rotated  Lt  _28  Lat Flexed Lt _29  Rotated  Rt _30  Lat Flexed Rt _31  Limited Head Control    _32  Cervical Hyperextension _33  Absent  Head Control    SHOULDERS ELBOWS WRIST& HAND         Left     Right    Left     Right  U/E _34 Functional  Left            _35 Functional  Right                                 _36 Fisting             _37 Fisting     _38 elevated Left _39 depressed  Left _40 elevated Right _41 depressed  Right      _42 protracted Left _43 retracted Left _44 protracted Right _45 retracted Right _46 subluxed  Left              _47 subluxed  Right         Goals for Wheelchair Mobility  _48  Independence with mobility in the home with motor related ADLs (MRADLs)  _49  Independence with MRADLs in the community _50  Provide dependent mobility  _51  Provide recline     _52 Provide tilt   Goals for Seating system _53  Optimize pressure distribution _54  Provide support needed to facilitate function or safety _55  Provide corrective forces to assist with maintaining or improving posture _56  Accommodate client's posture: current seated postures and  positions are not flexible or will not tolerate corrective forces _57  Client to be independent with relieving pressure in the wheelchair _58 Enhance physiological function such as breathing, swallowing, digestion  Simulation ideas/Equipment trials:  State why other equipment was unsuccessful:                                                                                 MOBILITY BASE RECOMMENDATIONS and JUSTIFICATION: MOBILITY COMPONENT JUSTIFICATION  Manufacturer:           Model:   Quantum stretto           Size: Width    18 x19       Seat Depth             _0 provide transport from point A to B _1 promote Indep mobility  _2 is not a safe, functional ambulator _3 walker or cane inadequate _4 non-standard width/depth necessary to accommodate anatomical measurement _5                             _6 Manual Mobility Base _7 non-functional ambulator    _8 Scooter/POV  _9 can safely operate  _10 can safely transfer   _11 has adequate trunk stability  _12 cannot functionally propel manual w/c  _13 Power Mobility Base  _14 non-ambulatory  _15 cannot functionally propel manual wheelchair  _16  cannot functionally and safely operate scooter/POV _17 can safely operate and willing to  _18 Stroller Base _19 infant/child  _20 unable to propel manual wheelchair _21 allows for growth _22 non-functional ambulator _23 non-functional UE _24 Indep mobility is not a goal at this time  _25 Tilt  _26 Forward                   _27 Backward                  _28 Powered tilt              _29 Manual tilt  _30 change position against gravitational force on head and shoulders  _31 change position for pressure relief/cannot weight shift _32 transfers  _33 management of tone _34 rest periods _35 control edema _36 facilitate postural control  _37                                       _38 Recline  _39 Power recline on power base _40 Manual recline on manual  base  _41 accommodate femur to back angle  _42 bring to full recline for ADL care  _43 change position for pressure relief/cannot weight shift _44 rest periods _45 repositioning for transfers or clothing/diaper /catheter changes _46 head positioning  _47 Lighter weight required _48 self- propulsion  _49 lifting _50                                                 _51 Heavy Duty required _52 user weight greater than 250# _53 extreme tone/ over active movement _54 broken frame on previous chair _55                                     _56  Back  _57  Angle Adjustable _58  Custom molded                           [  x]postural control _0 control of tone/spasticity _1 accommodation of range of motion _2 UE functional control _3 accommodation for seating system _4                                          _5 provide lateral trunk support _6 accommodate deformity _7 provide posterior trunk support _8 provide lumbar/sacral support _9 support trunk in midline _10 Pressure relief over spinal processes  _11  Seat Cushion                       _12 impaired sensation  _13 decubitus ulcers present _14 history of pressure ulceration _15 prevent pelvic extension _16 low maintenance  _17 stabilize pelvis  _18 accommodate obliquity _19 accommodate multiple deformity _20 neutralize lower extremity position _21 increase pressure distribution _22                                           _23  Pelvic/thigh support  _24  Lateral thigh guide _25  Distal medial pad  _26  Distal lateral pad _27  pelvis in neutral _28 accommodate pelvis _29  position upper legs _30  alignment _31  accommodate ROM _32  decrease adduction _33 accommodate tone _34 removable for transfers _35 decrease abduction  _36  Lateral trunk Supports _37  Lt     _38  Rt _39 decrease lateral trunk leaning _40 control tone _41 contour for increased contact _42 safety  _43 accommodate asymmetry _44                                                 _45  Mounting hardware  _46 lateral trunk supports  _47 back   _48 seat _49 headrest       _50  thigh support _51 fixed   _52 swing away _53 attach seat platform/cushion to w/c frame _54 attach back cushion to w/c frame _55 mount postural supports _56 mount headrest  _57 swing medial thigh support away _58 swing lateral supports away for transfers  _59                                                     Armrests  _60 fixed _61 adjustable height _62 removable   _63 swing away  _64 flip back   _65 reclining _66 full length pads _67 desk    _68 pads tubular  _69 provide support with elbow at 90   _70 provide support for w/c tray _71 change of height/angles for variable activities _72 remove for transfers _73 allow to come closer to table top _74 remove for access to tables _75                                               Hangers/ Leg rests  _76 60 _77 70 _78 90 _79 elevating _80 heavy duty  _81 articulating _82 fixed _83 lift off _84 swing away     _85 power _86 provide LE support  _87 accommodate to hamstring tightness _88 elevate legs during recline   _89 provide change in position for Legs _90 Maintain placement of feet on footplate _91 durability _92 enable transfers _93 decrease edema _94 Accommodate lower leg length _95   Foot support Footplate    <[id number]>_4<\/YJEHUDJSHFWYOVZC>_5 Lt  _1  Rt  _2  Center mount _3 flip up                            _4 depth/angle adjustable _5 Amputee adapter    _6  Lt     _7  Rt _8 provide foot support _9 accommodate to ankle ROM _10 transfers _11 Provide support for residual extremity _12  allow foot to go under wheelchair base _13  decrease tone  _14                                                 _15  Ankle strap/heel loops _16 support foot on foot support _17 decrease extraneous movement _18 provide input to heel  _19 protect foot  Tires: _20 pneumatic  _21 flat free inserts  _22 solid  _23 decrease maintenance  _24 prevent frequent flats _25 increase shock absorbency _26 decrease pain from road shock _27 decrease spasms from road shock _28                                              _29  Headrest  _30 provide posterior head  support _31 provide posterior neck support _32 provide lateral head support _33 provide anterior head support _34 support during tilt and recline _35 improve feeding   _36 improve respiration _37 placement of switches _38 safety  _39 accommodate ROM  _40 accommodate tone _41 improve visual orientation  _42  Anterior chest strap _43  Vest _44  Shoulder retractors  _45 decrease forward movement of shoulder _46 accommodation of TLSO _47 decrease forward movement of trunk _48 decrease shoulder elevation _49 added abdominal support _50 alignment _51 assistance with shoulder control  _52                                               Pelvic Positioner _53 Belt _54 SubASIS bar _55 Dual Pull _56 stabilize tone _57 decrease falling out of chair/ **will not Decrease potential for sliding due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

## 2019-05-12 ENCOUNTER — Encounter: Payer: Medicare Other | Admitting: Physician Assistant

## 2019-05-12 ENCOUNTER — Other Ambulatory Visit: Payer: Self-pay

## 2019-05-12 DIAGNOSIS — E11622 Type 2 diabetes mellitus with other skin ulcer: Secondary | ICD-10-CM | POA: Diagnosis not present

## 2019-05-12 NOTE — Progress Notes (Addendum)
[name], [name] ([id number]) Visit Report for 05/12/2019 Arrival Information Details Patient Name: [name], [name] Date of Service: 05/12/2019 9:00 AM Medical Record Number: [id number] Patient Account Number: [id number] Date of Birth/Sex: 1961/07/21 (57 y.o. M) Treating RN: Army Melia Primary Care Arionna Hoggard: [name] Other Clinician: Referring Renesme Kerrigan: [name] Treating Alysabeth Scalia/Extender: [name], HOYT Weeks in Treatment: 15 Visit Information History Since Last Visit Added or deleted any medications: No Patient Arrived: Wheel Chair Any new allergies or adverse reactions: No Arrival Time: 09:07 Had a fall or experienced change in No Accompanied By: wife activities of daily living that may affect Transfer Assistance: None risk of falls: Patient Has Alerts: Yes Signs or symptoms of abuse/neglect since last visito No Patient Alerts: Type II Diabetic Hospitalized since last visit: No Aspirin 81mg  Has Dressing in Place as Prescribed: Yes Pain Present Now: No Electronic Signature(s) Signed: 05/12/2019 11:40:32 AM By: Army Melia Entered By: Army Melia on 05/12/2019 09:08:09 [name] ([id number]) -------------------------------------------------------------------------------- Encounter Discharge Information Details Patient Name: [name], [name] Date of Service: 05/12/2019 9:00 AM Medical Record Number: [id number] Patient Account Number: [id number] Date of Birth/Sex: [birth year] (57 y.o. M) Treating RN: [name] Primary Care Sher Shampine: [name] Other Clinician: Referring Tanner Vigna: [name] Treating Lourdes Manning/Extender: [name], HOYT Weeks in Treatment: 15 Encounter Discharge Information Items Post Procedure Vitals Discharge Condition: Stable Temperature (F): 98.2 Ambulatory Status: Wheelchair Pulse (bpm): 60 Discharge Destination: Home Respiratory Rate (breaths/min): 16 Transportation: Private Auto Blood Pressure (mmHg): 87/57 Accompanied  By: spouse Schedule Follow-up Appointment: Yes Clinical Summary of Care: Electronic Signature(s) Signed: 05/12/2019 4:49:07 PM By: [name] Entered By: [name] on 05/12/2019 09:41:13 Iafrate, [name] ([id number]) -------------------------------------------------------------------------------- Lower Extremity Assessment Details Patient Name: [name], [name] Date of Service: 05/12/2019 9:00 AM Medical Record Number: [id number] Patient Account Number: [id number] Date of Birth/Sex: Feb 26, 1961 (57 y.o. M) Treating RN: Army Melia Primary Care Peretz Thieme: [name] Other Clinician: Referring Danel Requena: [name] Treating Ranita Stjulien/Extender: [name], HOYT Weeks in Treatment: 15 Electronic Signature(s) Signed: 05/12/2019 11:40:32 AM By: Army Melia Entered By: Army Melia on 05/12/2019 09:13:48 [name] ([id number]) -------------------------------------------------------------------------------- Multi Wound Chart Details Patient Name: Jurczyk, [name] Date of Service: 05/12/2019 9:00 AM Medical Record Number: [id number] Patient Account Number: [id number] Date of Birth/Sex: 08/12/1961 (57 y.o. M) Treating RN: [name] Primary Care Infantof Villagomez: [name] Other Clinician: Referring Bilan Tedesco: [name] Treating Birttany Dechellis/Extender: STONE III, HOYT Weeks in Treatment: 15 Vital Signs Height(in): 67 Pulse(bpm): 60 Weight(lbs): 220 Blood Pressure(mmHg): 87/57 Body Mass Index(BMI): 34 Temperature(F): 98.2 Respiratory Rate 16 (breaths/min): Photos: [N/A:N/A] Wound Location: Right Amputation Site - Above N/A N/A Knee Wounding Event: Surgical Injury N/A N/A Primary Etiology: Arterial Insufficiency Ulcer N/A N/A Secondary Etiology: Diabetic Wound/Ulcer of the N/A N/A Lower Extremity Comorbid History: Arrhythmia, Congestive Heart N/A N/A Failure, Hypertension, Peripheral Arterial Disease, Type II Diabetes, End Stage Renal Disease Date Acquired:  09/09/2018 N/A N/A Weeks of Treatment: 15 N/A N/A Wound Status: Open N/A N/A Measurements L x W x D 2.5x3x0.2 N/A N/A (cm) Area (cm) : 5.89 N/A N/A Volume (cm) : 1.178 N/A N/A % Reduction in Area: 61.50% N/A N/A % Reduction in Volume: 87.20% N/A N/A Classification: Full Thickness Without N/A N/A Exposed Support Structures Exudate Amount: Medium N/A N/A Exudate Type: Serosanguineous N/A N/A Exudate Color: red, brown N/A N/A Wound Margin: Thickened N/A N/A Granulation Amount: Medium (34-66%) N/A N/A Granulation Quality: Red, Hyper-granulation N/A N/A Necrotic Amount: Medium (34-66%) N/A N/A Aungst, Keatyn L. ([id number]) Exposed Structures: Fat  Layer (Subcutaneous N/A N/A Tissue) Exposed: Yes Fascia: No Tendon: No Muscle: No Joint: No Bone: No Epithelialization: Small (1-33%) N/A N/A Treatment Notes Electronic Signature(s) Signed: 05/12/2019 4:49:07 PM By: [name] Entered By: [name] on 05/12/2019 09:31:55 [name] ([id number]) -------------------------------------------------------------------------------- [facility] Details Patient Name: [name], [name] Date of Service: 05/12/2019 9:00 AM Medical Record Number: [id number] Patient Account Number: [id number] Date of Birth/Sex: [birth year] (57 y.o. M) Treating RN: [name] Primary Care Caliph Borowiak: [name] Other Clinician: Referring Keimon Basaldua: [name] Treating Dagan Heinz/Extender: [name], HOYT Weeks in Treatment: 15 Active Inactive Abuse / Safety / Falls / Self Care Management Nursing Diagnoses: Impaired physical mobility Goals: Patient will not develop complications from immobility Date Initiated: 01/27/2019 Target Resolution Date: 04/25/2019 Goal Status: Active Interventions: Assess fall risk on admission and as needed Notes: Orientation to the Wound Care Program Nursing Diagnoses: Knowledge deficit related to the wound healing center  program Goals: Patient/caregiver will verbalize understanding of the [place] Program Date Initiated: 01/27/2019 Target Resolution Date: 04/25/2019 Goal Status: Active Interventions: Provide education on orientation to the wound center Notes: Pain, Acute or Chronic Nursing Diagnoses: Pain, acute or chronic: actual or potential Goals: Patient will verbalize adequate pain control and receive pain control interventions during procedures as needed Date Initiated: 01/27/2019 Target Resolution Date: 04/25/2019 Goal Status: Active Interventions: Complete pain assessment as per visit requirements IVO, MOGA. ([id number]) Notes: Wound/Skin Impairment Nursing Diagnoses: Impaired tissue integrity Goals: Ulcer/skin breakdown will heal within 14 weeks Date Initiated: 01/27/2019 Target Resolution Date: 04/25/2019 Goal Status: Active Interventions: Assess patient/caregiver ability to obtain necessary supplies Assess patient/caregiver ability to perform ulcer/skin care regimen upon admission and as needed Assess ulceration(s) every visit Notes: Electronic Signature(s) Signed: 05/12/2019 4:49:07 PM By: [name] Entered By: [name] on 05/12/2019 09:31:44 [place]leman, [name] ([id number]) -------------------------------------------------------------------------------- Pain Assessment Details Patient Name: [name], Jamarcus L. Date of Service: 05/12/2019 9:00 AM Medical Record Number: [id number] Patient Account Number: [id number] Date of Birth/Sex: Jun 04, 1961 (57 y.o. M) Treating RN: Army Melia Primary Care Yazaira Speas: [name] Other Clinician: Referring Trinitie Mcgirr: [name] Treating Ryllie Nieland/Extender: STONE III, HOYT Weeks in Treatment: 15 Active Problems Location of Pain Severity and Description of Pain Patient Has Paino No Site Locations Pain Management and Medication Current Pain Management: Electronic Signature(s) Signed: 05/12/2019 11:40:32 AM By: Army Melia Entered By: Army Melia on 05/12/2019 09:08:21 [name] ([id number]) -------------------------------------------------------------------------------- Patient/Caregiver Education Details Patient Name: [name], Micael L. Date of Service: 05/12/2019 9:00 AM Medical Record Number: [id number] Patient Account Number: [id number] Date of Birth/Gender: Mar 31, 1961 (57 y.o. M) Treating RN: [name] Primary Care Physician: [name] Other Clinician: Referring Physician: [name] Treating Physician/Extender: Sharalyn Ink in Treatment: 15 Education Assessment Education Provided To: Patient and Caregiver Education Topics Provided Wound/Skin Impairment: Handouts: Other: wound care as ordered Methods: Demonstration, Explain/Verbal Responses: State content correctly Electronic Signature(s) Signed: 05/12/2019 4:49:07 PM By: [name] Entered By: [name] on 05/12/2019 09:39:26 Speigner, [name] ([id number]) -------------------------------------------------------------------------------- Wound Assessment Details Patient Name: Grudzinski, Benjiman L. Date of Service: 05/12/2019 9:00 AM Medical Record Number: [id number] Patient Account Number: [id number] Date of Birth/Sex: 1961/09/28 (57 y.o. M) Treating RN: Army Melia Primary Care Roshawn Lacina: [name] Other Clinician: Referring Teyton Pattillo: [name] Treating Ahren Pettinger/Extender: STONE III, HOYT Weeks in Treatment: 15 Wound Status Wound Number: 1 Primary Arterial Insufficiency Ulcer Etiology: Wound Location: Right Amputation Site - Above Knee Secondary Diabetic Wound/Ulcer of the Lower Extremity Wounding Event: Surgical Injury Etiology: Date Acquired: 09/09/2018  Wound Open Weeks Of Treatment: 15 Status: Clustered Wound: No Comorbid Arrhythmia, Congestive Heart Failure, History: Hypertension, Peripheral Arterial Disease, Type II Diabetes, End Stage Renal Disease Photos Wound Measurements Length:  (cm) 2.5 Width: (cm) 3 Depth: (cm) 0.2 Area: (cm) 5.89 Volume: (cm) 1.178 % Reduction in Area: 61.5% % Reduction in Volume: 87.2% Epithelialization: Small (1-33%) Tunneling: No Undermining: No Wound Description Full Thickness Without Exposed Support Foul O Classification: Structures Slough Wound Margin: Thickened Exudate Medium Amount: Exudate Type: Serosanguineous Exudate Color: red, brown dor After Cleansing: No /Fibrino Yes Wound Bed Granulation Amount: Medium (34-66%) Exposed Structure Granulation Quality: Red, Hyper-granulation Fascia Exposed: No Necrotic Amount: Medium (34-66%) Fat Layer (Subcutaneous Tissue) Exposed: Yes Necrotic Quality: Adherent Slough Tendon Exposed: No Muscle Exposed: No Joint Exposed: No Henne, Declo ([id number]) Bone Exposed: No Treatment Notes Wound #1 (Right Amputation Site - Above Knee) Notes santyl, BFD Electronic Signature(s) Signed: 05/12/2019 11:40:32 AM By: Army Melia Entered By: Army Melia on 05/12/2019 09:12:24 [name] ([id number]) -------------------------------------------------------------------------------- Vitals Details Patient Name: [name], Coe L. Date of Service: 05/12/2019 9:00 AM Medical Record Number: [id number] Patient Account Number: [id number] Date of Birth/Sex: April 14, 1961 (57 y.o. M) Treating RN: Army Melia Primary Care Aman Bonet: [name] Other Clinician: Referring Linton Stolp: [name] Treating Arohi Salvatierra/Extender: STONE III, HOYT Weeks in Treatment: 15 Vital Signs Time Taken: 09:08 Temperature (F): 98.2 Height (in): 67 Pulse (bpm): 60 Weight (lbs): 220 Respiratory Rate (breaths/min): 16 Body Mass Index (BMI): 34.5 Blood Pressure (mmHg): 87/57 Reference Range: 80 - 120 mg / dl Electronic Signature(s) Signed: 05/12/2019 11:40:32 AM By: Army Melia Entered By: Army Melia on 05/12/2019 09:10:01

## 2019-05-12 NOTE — Progress Notes (Addendum)
AITAN, ROSSBACH ([id number]) Visit Report for 05/12/2019 Chief Complaint Document Details Patient Name: [name], [name] Date of Service: 05/12/2019 9:00 AM Medical Record Number: [id number] Patient Account Number: [id number] Date of Birth/Sex: [birth year] (57 y.o. M) Treating RN: [name] Primary Care Provider: [name] Other Clinician: Referring Provider: [name] Treating Provider/Extender: [name], HOYT Weeks in Treatment: 15 Information Obtained from: Patient Chief Complaint Right AKA Ulcer at surgical incision Electronic Signature(s) Signed: 05/12/2019 9:06:35 AM By: Worthy Keeler PA-C Entered By: Worthy Keeler on 05/12/2019 09:06:34 [name] ([id number]) -------------------------------------------------------------------------------- Debridement Details Patient Name: [name], [name] Date of Service: 05/12/2019 9:00 AM Medical Record Number: [id number] Patient Account Number: [id number] Date of Birth/Sex: [birth year] (57 y.o. M) Treating RN: [name] Primary Care Provider: [name] Other Clinician: Referring Provider: [name] Treating Provider/Extender: [name], HOYT Weeks in Treatment: 15 Debridement Performed for Wound #1 Right Amputation Site - Above Knee Assessment: Performed By: Clinician [name], RN Debridement Type: Chemical/Enzymatic/Mechanical Agent Used: Santyl Severity of Tissue Pre Fat layer exposed Debridement: Level of Consciousness (Pre- Awake and Alert procedure): Pre-procedure Verification/Time Yes - 09:35 Out Taken: Start Time: 09:35 Pain Control: Lidocaine 4% Topical Solution Instrument: Other : tongue blade Bleeding: None End Time: 09:36 Procedural Pain: 0 Post Procedural Pain: 0 Response to Treatment: Procedure was tolerated well Level of Consciousness Awake and Alert (Post-procedure): Post Debridement Measurements of Total Wound Length: (cm) 2.5 Width: (cm) 3 Depth: (cm) 0.2 Volume:  (cm) 1.178 Character of Wound/Ulcer Post Debridement: Improved Severity of Tissue Post Debridement: Fat layer exposed Post Procedure Diagnosis Same as Pre-procedure Electronic Signature(s) Signed: 05/12/2019 4:49:07 PM By: [name] Signed: 05/13/2019 9:47:59 AM By: Worthy Keeler PA-C Entered By: [name] on 05/12/2019 09:39:00 [name] ([id number]) -------------------------------------------------------------------------------- HPI Details Patient Name: [name], [name] Date of Service: 05/12/2019 9:00 AM Medical Record Number: [id number] Patient Account Number: [id number] Date of Birth/Sex: 1960-11-20 (57 y.o. M) Treating RN: [name] Primary Care Provider: [name] Other Clinician: Referring Provider: [name] Treating Provider/Extender: [name], HOYT Weeks in Treatment: 15 History of Present Illness HPI Description: 01/27/19 patient presents today for initial evaluation here in our clinic concerning an issue he's been having with a wound at the right above knee amputation site which began shortly following the surgery in November 2019. 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 He has a history of hypertension, diabetes, congestive heart failure, and states that he is having a lot of discomfort at the wound site unfortunately. There does not appear to be any signs of active infection upon initial inspection today. No fevers, chills, nausea, or vomiting noted at this time.. Silvadene Cream has been utilized at some point on this wound as well. Patient did have home health coming out but unfortunately the patient and his wife state that they were not happy with the services being rendered and therefore discontinued the service. There does not appear to be any  signs of otherwise active infection at this time which is good news. The patient states he's definitely ready for this to heal and has been a symptomatic time since his surgery and he is becoming somewhat frustrated. Iodoflex also sounds to have previously been used on the wound that is when the patient's wife states the wound seem to  get worse not better. As best I can tell [name] has not been utilized. 02/03/19 on evaluation today patient appears to be doing much better compared to last week's evaluation in regard to his ulcer on the right amputation site. He's been tolerating the dressing changes without complication which is good news. The Santyl does seem to be of benefit in fact the wound appears to be doing significantly better compared to my prior evaluation. Overall very pleased in this regard. No fevers, chills, nausea, or vomiting noted at this time. 02/10/19 on evaluation today patient's wound actually appears to be showing signs of excellent improvement at this time which is great news. He has been tolerating the dressing changes without complication overall I feel like that there is no signs of active infection at this point which is also good news. I feel like that the [name] is very beneficial. Patient's wife likewise feels like things are doing quite well and is not having pain like he was in the past. 02/17/19 on evaluation today patient appears to be doing well in regard to the wound on the distal aspect of his right amputation site. He's been tolerating treatment with the Santyl at this point. I think he is nearing completion as far as treatment with cental is concerned I think were very close to getting this significantly better. Fortunately there's no evidence of infection currently. 03/03/19 on evaluation today patient actually appears to be doing quite well in regard to his his right imputation site ulceration. In fact there's a lot of new skin growth which is excellent news. I'm  very pleased in this regard. Fortunately there's no signs of active infection also good news. No fevers, chills, nausea, or vomiting noted at this time. 03/17/19 on evaluation today patient's wound actually appears to be showing signs of improvement which is good news. He does have a lot of issues however with concerns about how slowly this seems to be healing as far as he and his wife are concerned. I do believe some of the issues that were having currently are from the language barrier perspective but even that we have an interpreter that has been present for each evaluation I still think that there is some issues with their overall understanding of the treatment plan and how long wounds of this nature do take to heal. We began seeing him on April 14 and in just over a month and a half he has made improvement to where the wound is one third the size originally began. Nonetheless there still quite a bit of feeling left to do but again he has made great progress up to this point. Unfortunately he did have some issues with the silver collagen that was ordered he received something different from the [facility]. This was Colactive. Unfortunately this actually calls some burning he seemed to have some irritation from the dressing itself that caused irritation and redness around the edge of the wound that seems to be improving but nonetheless he and his wife both believe that the Santyl might've done better. They also really want to try to utilize something that will prevent me having to degree the wound as he typically has discomfort with the debridement obviously. 03/31/19 on evaluation today patient actually appears to be doing better in regard to his lower extremity ulcer. He's been tolerating the dressing changes without complication. Fortunately there's no signs of active infection at this time. He has a lot of new skin growth in the Santyl does seem to  be working appropriately for him at this time. I'm  very pleased in this regard. [name], [name] ([id number]) 04/14/19 on evaluation today patient appears to be doing quite well in regard to his invitation site ulcer. This is a little smaller even than half the size of where it was when I saw him last. Fortunately there is no sign of active infection at this time which is good news. Overall he has been tolerating the dressing changes without complication still states that he has a lot of pain but again I really cannot see anything with regard to the wound that would be causing this and maybe more for neuropathy type pain. He states his weeks and up at night sometimes. 04/28/19-Patient presents at 2 weeks, with the right AKA wound, we have been using Santyl for this and there was mention of changing it to a different dressing this time around. 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 He's been tolerating the dressing changes with the Santyl unfortunately when we switched to the collagen dressing again last time he was seen in the office when I wasn't here his wound bed deteriorated as well in the following days. His wife came in to discuss this with me and subsequently we opted to go back to the [place] which seems to be doing excellent for him at this point. Electronic Signature(s) Signed: 05/13/2019 9:47:59 AM By: Worthy Keeler PA-C Entered By: Worthy Keeler on 05/12/2019 23:56:11 [name] ([id number]) -------------------------------------------------------------------------------- Physical Exam Details Patient Name: [name], [name] Date of Service: 05/12/2019 9:00 AM Medical Record Number: [id number] Patient Account Number: [id number] Date of Birth/Sex: Sep 21, 1961 (57 y.o. M) Treating RN: [name] Primary Care Provider: [name] Other Clinician: Referring Provider: [name] Treating  Provider/Extender: STONE III, HOYT Weeks in Treatment: 15 Constitutional Obese and well-hydrated in no acute distress. Respiratory normal breathing without difficulty. clear to auscultation bilaterally. Cardiovascular regular rate and rhythm with normal S1, S2. Psychiatric this patient is able to make decisions and demonstrates good insight into disease process. Alert and Oriented x 3. pleasant and cooperative. Notes Patient's wound bed did not require any sharp debridement today since he seems to be doing excellent in this regard I think that we would definitely want to continue with the current measures I do not see any need to switch up the dressing especially since every time we seem to do this things seem to go downhill. He is very close to complete closure. Electronic Signature(s) Signed: 05/13/2019 9:47:59 AM By: Worthy Keeler PA-C Entered By: Worthy Keeler on 05/12/2019 23:57:11 [name] ([id number]) -------------------------------------------------------------------------------- Physician Orders Details Patient Name: Kristopher Haggard L. Date of Service: 05/12/2019 9:00 AM Medical Record Number: [id number] Patient Account Number: [id number] Date of Birth/Sex: 02-03-1961 (57 y.o. M) Treating RN: [name] Primary Care Provider: [name] Other Clinician: Referring Provider: [name] Treating Provider/Extender: [name], HOYT Weeks in Treatment: 15 Verbal / Phone Orders: No Diagnosis Coding ICD-10 Coding Code Description I73.89 Other specified peripheral vascular diseases L97.812 Non-pressure chronic ulcer of other part of right lower leg with fat layer exposed Z89.611 Acquired absence of right leg above knee E11.622 Type 2 diabetes mellitus with other skin ulcer I50.42 Chronic combined systolic (congestive) and diastolic (congestive) heart failure N18.6 End stage renal disease Z99.2 Dependence on renal dialysis I10 Essential (primary)  hypertension I48.0 Paroxysmal atrial fibrillation  Wound Cleansing Wound #1 Right Amputation Site - Above Knee o Clean wound with Normal Saline. Anesthetic (add to Medication List) Wound #1 Right Amputation Site - Above Knee o Topical Lidocaine 4% cream applied to wound bed prior to debridement (In Clinic Only). Primary Wound Dressing Wound #1 Right Amputation Site - Above Knee o Santyl Ointment Secondary Dressing Wound #1 Right Amputation Site - Above Knee o ABD pad Dressing Change Frequency Wound #1 Right Amputation Site - Above Knee o Change dressing every other day. Follow-up Appointments Wound #1 Right Amputation Site - Above Knee o Return Appointment in 2 weeks. Electronic Signature(s) Signed: 05/12/2019 4:49:07 PM By: Rob Bunting, [name] ([id number]) Signed: 05/13/2019 9:47:59 AM By: Worthy Keeler PA-C Entered By: [name] on 05/12/2019 09:34:23 [name] ([id number]) -------------------------------------------------------------------------------- Problem List Details Patient Name: [name], [name] Date of Service: 05/12/2019 9:00 AM Medical Record Number: [id number] Patient Account Number: [id number] Date of Birth/Sex: 25-Dec-1960 (57 y.o. M) Treating RN: [name] Primary Care Provider: [name] Other Clinician: Referring Provider: [name] Treating Provider/Extender: [name], HOYT Weeks in Treatment: 15 Active Problems ICD-10 Evaluated Encounter Code Description Active Date Today Diagnosis I73.89 Other specified peripheral vascular diseases 01/27/2019 No Yes L97.812 Non-pressure chronic ulcer of other part of right lower leg 01/27/2019 No Yes with fat layer exposed Z89.611 Acquired absence of right leg above knee 01/27/2019 No Yes E11.622 Type 2 diabetes mellitus with other skin ulcer 01/27/2019 No Yes I50.42 Chronic combined systolic (congestive) and diastolic 4/46/2863 No Yes (congestive) heart failure N18.6 End  stage renal disease 01/27/2019 No Yes Z99.2 Dependence on renal dialysis 01/27/2019 No Yes I10 Essential (primary) hypertension 01/27/2019 No Yes I48.0 Paroxysmal atrial fibrillation 01/27/2019 No Yes Inactive Problems Resolved Problems BURLEIGH, BROCKMANN ([id number]) Electronic Signature(s) Signed: 05/12/2019 9:06:28 AM By: Worthy Keeler PA-C Entered By: Worthy Keeler on 05/12/2019 09:06:28 [name] ([id number]) -------------------------------------------------------------------------------- Progress Note Details Patient Name: [name], [name] Date of Service: 05/12/2019 9:00 AM Medical Record Number: [id number] Patient Account Number: [id number] Date of Birth/Sex: 12/21/1960 (57 y.o. M) Treating RN: [name] Primary Care Provider: [name] Other Clinician: Referring Provider: [name] Treating Provider/Extender: [name], HOYT Weeks in Treatment: 15 Subjective Chief Complaint Information obtained from Patient Right AKA Ulcer at surgical incision History of Present Illness (HPI) 01/27/19 patient presents today for initial evaluation here in our clinic concerning an issue he's been having with a wound at the right above knee amputation site which began shortly following the surgery in November 2019. 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 He has a history of hypertension, diabetes, congestive heart failure, and states that he is having a lot of discomfort at the wound site unfortunately. There does not appear to be any signs of active infection upon initial inspection today. No fevers, chills, nausea, or vomiting noted at this time.. Silvadene Cream has been utilized at some point on this wound as well. Patient did have home health coming out but unfortunately the patient and  his wife state that they were not happy with the services being rendered and therefore discontinued the service. There does not appear to be any signs of otherwise active infection at this time which is good news. The patient states he's definitely ready for this to heal and has  been a symptomatic time since his surgery and he is becoming somewhat frustrated. Iodoflex also sounds to have previously been used on the wound that is when the patient's wife states the wound seem to get worse not better. As best I can tell [name] has not been utilized. 02/03/19 on evaluation today patient appears to be doing much better compared to last week's evaluation in regard to his ulcer on the right amputation site. He's been tolerating the dressing changes without complication which is good news. The Santyl does seem to be of benefit in fact the wound appears to be doing significantly better compared to my prior evaluation. Overall very pleased in this regard. No fevers, chills, nausea, or vomiting noted at this time. 02/10/19 on evaluation today patient's wound actually appears to be showing signs of excellent improvement at this time which is great news. He has been tolerating the dressing changes without complication overall I feel like that there is no signs of active infection at this point which is also good news. I feel like that the [name] is very beneficial. Patient's wife likewise feels like things are doing quite well and is not having pain like he was in the past. 02/17/19 on evaluation today patient appears to be doing well in regard to the wound on the distal aspect of his right amputation site. He's been tolerating treatment with the Santyl at this point. I think he is nearing completion as far as treatment with cental is concerned I think were very close to getting this significantly better. Fortunately there's no evidence of infection currently. 03/03/19 on evaluation today patient actually appears to  be doing quite well in regard to his his right imputation site ulceration. In fact there's a lot of new skin growth which is excellent news. I'm very pleased in this regard. Fortunately there's no signs of active infection also good news. No fevers, chills, nausea, or vomiting noted at this time. 03/17/19 on evaluation today patient's wound actually appears to be showing signs of improvement which is good news. He does have a lot of issues however with concerns about how slowly this seems to be healing as far as he and his wife are concerned. I do believe some of the issues that were having currently are from the language barrier perspective but even that we have an interpreter that has been present for each evaluation I still think that there is some issues with their overall understanding of the treatment plan and how long wounds of this nature do take to heal. We began seeing him on April 14 and in just over a month and a half he has made improvement to where the wound is one third the size originally began. Nonetheless there still quite a bit of feeling left to do but again he has made great progress up to this point. Unfortunately he did have some issues with the silver collagen that was ordered he received something different from the [facility]. This was Colactive. Unfortunately this actually calls some burning he seemed to have some irritation from the dressing itself that caused irritation and redness around the edge of the wound that seems to be improving but nonetheless he and his wife both [name], [name] ([id number]) believe that the Santyl might've done better. They also really want to try to utilize something that will prevent me having to degree the wound as he typically has discomfort with the debridement obviously. 03/31/19 on evaluation today patient actually appears to be doing better  in regard to his lower extremity ulcer. He's been tolerating the dressing changes without complication.  Fortunately there's no signs of active infection at this time. He has a lot of new skin growth in the Santyl does seem to be working appropriately for him at this time. I'm very pleased in this regard. 04/14/19 on evaluation today patient appears to be doing quite well in regard to his invitation site ulcer. This is a little smaller even than half the size of where it was when I saw him last. Fortunately there is no sign of active infection at this time which is good news. Overall he has been tolerating the dressing changes without complication still states that he has a lot of pain but again I really cannot see anything with regard to the wound that would be causing this and maybe more for neuropathy type pain. He states his weeks and up at night sometimes. 04/28/19-Patient presents at 2 weeks, with the right AKA wound, we have been using Santyl for this and there was mention of changing it to a different dressing this time around. 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 He's been tolerating the dressing changes with the Santyl unfortunately when we switched to the collagen dressing again last time he was seen in the office when I wasn't here his wound bed deteriorated as well in the following days. His wife came in to discuss this with me and subsequently we opted to go back to the [place] which seems to be doing excellent for him at this point. Patient History Information obtained from Patient. Family History Cancer - Father, Diabetes - Siblings, No family history of Hereditary Spherocytosis. Social History Former smoker - ended on 10/16/1994, Marital Status - Married, Alcohol Use - Never, Drug Use - No History, Caffeine Use - Rarely. Medical History Cardiovascular Patient has history of Arrhythmia - A-fib, Congestive Heart Failure, Hypertension, Peripheral  Arterial Disease - Bilateral LE amputations Endocrine Patient has history of Type II Diabetes Genitourinary Patient has history of End Stage Renal Disease - Dialysis M,W,F Hospitalization/Surgery History - Right AKA. - Left BKA. 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 Respiratory Denies complaints or symptoms of Chronic or frequent coughs, Shortness of Breath. Cardiovascular Denies complaints or symptoms of Chest pain, LE edema. Psychiatric Denies complaints or symptoms of Anxiety, Claustrophobia. [name], [name] ([id number]) Objective Constitutional Obese and well-hydrated in no acute distress. Vitals Time Taken: 9:08 AM, Height: 67 in, Weight: 220 lbs, BMI: 34.5, Temperature: 98.2 F, Pulse: 60 bpm, Respiratory Rate: 16 breaths/min, Blood Pressure: 87/57 mmHg. Respiratory normal breathing without difficulty. clear to auscultation bilaterally. Cardiovascular regular rate and rhythm with normal S1, S2. Psychiatric this patient is able to make decisions and demonstrates good insight into disease process. Alert and Oriented x 3. pleasant and cooperative. General Notes: Patient's wound bed did not require any sharp debridement today since he seems to be doing excellent in this regard I think that we would definitely want to continue with the current measures I do not see any need to switch up the dressing especially since every time we seem to do this things seem to go downhill. He is very close to  complete closure. Integumentary (Hair, Skin) Wound #1 status is Open. Original cause of wound was Surgical Injury. The wound is located on the Right Amputation Site - Above Knee. The wound measures 2.5cm length x 3cm width x 0.2cm depth;  5.89cm^2 area and 1.178cm^3 volume. There is Fat Layer (Subcutaneous Tissue) Exposed exposed. There is no tunneling or undermining noted. There is a medium amount of serosanguineous drainage noted. The wound margin is thickened. There is medium (34-66%) red, hyper - granulation within the wound bed. There is a medium (34-66%) amount of necrotic tissue within the wound bed including Adherent Slough. 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 There was a Chemical/Enzymatic/Mechanical debridement performed by [name], RN. With the following instrument(s): tongue blade after achieving pain control using Lidocaine 4% Topical Solution. Agent used was [employer]. A time out was conducted at 09:35, prior to the start of the procedure. There was no bleeding. The procedure was tolerated well with a pain level of 0 throughout and a pain level of 0 following the procedure. Post Debridement Measurements: 2.5cm length x 3cm width x 0.2cm depth; 1.178cm^3 volume. Character of Wound/Ulcer Post Debridement is improved. Severity of Tissue Post Debridement is: Fat layer exposed. Post procedure Diagnosis Wound #1: Same as Pre-Procedure Plan Wound Cleansing: Wound #1 Right Amputation Site - Above Knee: Clean wound with Normal Saline. Anesthetic (add to Medication List): Wound #1  Right Amputation Site - Above Knee: Topical Lidocaine 4% cream applied to wound bed prior to debridement (In Clinic Only). Primary Wound Dressing: Wound #1 Right Amputation Site - Above Knee: Santyl Ointment Secondary Dressing: Wound #1 Right Amputation Site - Above Knee: ABD pad Dressing Change Frequency: Wound #1 Right Amputation Site - Above Knee: Change dressing every other day. Follow-up Appointments: Wound #1 Right Amputation Site - Above Knee: Return Appointment in 2 weeks. We can continue with the above wound care measures for the next week including the Santyl. Subsequently if anything changes or worsens in the interim between now and then he or his wife will contact the office and let me know. Otherwise I'm hopeful that he will continue to show signs of good improvement I do not think it will be much longer for this wound to completely close. Patient was seen in conjunction with the interpreter today in the clinic and this was greatly appreciated. Please see above for specific wound care orders. We will see patient for re-evaluation in 2 week(s) here in the clinic. If anything worsens or changes patient will contact our office for additional recommendations. Electronic Signature(s) Signed: 05/13/2019 9:47:59 AM By: Clovis Pu, Corinthian Carlean Jews ([id number]) Entered By: Worthy Keeler on 05/12/2019 23:57:54 [name] ([id number]) -------------------------------------------------------------------------------- ROS/PFSH Details Patient Name: [name], Zaide L. Date of Service: 05/12/2019 9:00 AM Medical Record Number: [id number] Patient Account Number: [id number] Date of Birth/Sex: 1961/03/30 (57 y.o. 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 Respiratory Complaints and Symptoms: Negative for: Chronic or frequent coughs; Shortness of Breath Cardiovascular Complaints and Symptoms: Negative for: Chest pain; LE edema Medical History: Positive for: Arrhythmia - A-fib; Congestive Heart Failure; Hypertension; Peripheral Arterial Disease - Bilateral LE amputations Psychiatric Complaints and Symptoms: Negative for: Anxiety; Claustrophobia Endocrine Medical History: Positive for: Type II Diabetes Time with diabetes: 14 years Treated with: Oral agents Blood sugar tested every day: No Blood sugar testing results: Breakfast: 128; Lunch: 145 Genitourinary Medical History: Positive for: End Stage Renal Disease - Dialysis M,W,F Immunizations [name], LASHOMB. ([id number]) Pneumococcal Vaccine: Received Pneumococcal Vaccination: Yes Implantable Devices None Hospitalization / Surgery History Type of Hospitalization/Surgery Right AKA Left BKA Family and Social History Cancer: Yes - Father; Diabetes: Yes - Siblings; Hereditary Spherocytosis: No; Former smoker - ended on 10/16/1994; Marital Status - Married; Alcohol Use: Never; Drug Use: No History; Caffeine Use: Rarely Physician Affirmation I have reviewed and agree with the above information. Electronic Signature(s) Signed: 05/13/2019 9:47:59 AM By: Worthy Keeler PA-C Signed: 05/13/2019 4:41:53 PM By: [name] Entered By: Worthy Keeler on 05/12/2019 23:56:33 [name], [name] ([id number]) -------------------------------------------------------------------------------- SuperBill Details Patient Name: [name], Kaemon L. Date of Service: 05/12/2019 Medical Record Number: [id number] Patient Account Number: [id number] Date of Birth/Sex: 06-09-1961 (58 y.o. 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 E11.622 Type 2 diabetes mellitus with other skin ulcer I50.42 Chronic combined systolic (congestive) and diastolic (congestive) heart failure N18.6 End stage renal disease Z99.2 Dependence on renal dialysis I10 Essential (primary) hypertension I48.0 Paroxysmal atrial fibrillation Facility Procedures CPT4 Code: 38177116 Description: 57903 - DEBRIDE W/O ANES NON SELECT Modifier: Quantity: 1 Physician Procedures CPT4 Code Description: [id number] 91916 - WC PHYS LEVEL 4 - EST PT ICD-10 Diagnosis Description I73.89 Other specified peripheral vascular diseases L97.812 Non-pressure chronic ulcer of other part of right lower leg w Z89.611 Acquired absence of right leg  above knee E11.622 Type 2 diabetes mellitus with other skin ulcer Modifier: ith fat layer expo Quantity: 1 sed Electronic Signature(s) Signed: 05/13/2019 9:47:59 AM By: Worthy Keeler PA-C Entered By: Worthy Keeler on 05/12/2019 23:58:13

## 2019-05-26 ENCOUNTER — Encounter: Payer: Medicare Other | Attending: Physician Assistant | Admitting: Physician Assistant

## 2019-05-26 ENCOUNTER — Other Ambulatory Visit: Payer: Self-pay

## 2019-05-26 DIAGNOSIS — Z7984 Long term (current) use of oral hypoglycemic drugs: Secondary | ICD-10-CM | POA: Diagnosis not present

## 2019-05-26 DIAGNOSIS — N186 End stage renal disease: Secondary | ICD-10-CM | POA: Diagnosis not present

## 2019-05-26 DIAGNOSIS — Z992 Dependence on renal dialysis: Secondary | ICD-10-CM | POA: Insufficient documentation

## 2019-05-26 DIAGNOSIS — Z89611 Acquired absence of right leg above knee: Secondary | ICD-10-CM | POA: Diagnosis not present

## 2019-05-26 DIAGNOSIS — Z79899 Other long term (current) drug therapy: Secondary | ICD-10-CM | POA: Insufficient documentation

## 2019-05-26 DIAGNOSIS — L97812 Non-pressure chronic ulcer of other part of right lower leg with fat layer exposed: Secondary | ICD-10-CM | POA: Insufficient documentation

## 2019-05-26 DIAGNOSIS — E1122 Type 2 diabetes mellitus with diabetic chronic kidney disease: Secondary | ICD-10-CM | POA: Diagnosis not present

## 2019-05-26 DIAGNOSIS — I509 Heart failure, unspecified: Secondary | ICD-10-CM | POA: Diagnosis not present

## 2019-05-26 DIAGNOSIS — L97822 Non-pressure chronic ulcer of other part of left lower leg with fat layer exposed: Secondary | ICD-10-CM | POA: Insufficient documentation

## 2019-05-26 DIAGNOSIS — I132 Hypertensive heart and chronic kidney disease with heart failure and with stage 5 chronic kidney disease, or end stage renal disease: Secondary | ICD-10-CM | POA: Insufficient documentation

## 2019-05-26 DIAGNOSIS — Z89512 Acquired absence of left leg below knee: Secondary | ICD-10-CM | POA: Diagnosis not present

## 2019-05-26 DIAGNOSIS — E11622 Type 2 diabetes mellitus with other skin ulcer: Secondary | ICD-10-CM | POA: Insufficient documentation

## 2019-05-26 DIAGNOSIS — T8189XA Other complications of procedures, not elsewhere classified, initial encounter: Secondary | ICD-10-CM | POA: Diagnosis not present

## 2019-05-26 NOTE — Progress Notes (Signed)
NAI, MATEL ([id number]) Visit Report for 05/26/2019 Chief Complaint Document Details Patient Name: [name], [name] Date of Service: 05/26/2019 9:00 AM Medical Record Number: [id number] Patient Account Number: [id number] Date of Birth/Sex: [birth year] (58 y.o. M) Treating RN: [name] Primary Care Provider: [name] Other Clinician: Referring Provider: [name] Treating Provider/Extender: [name], Aeryn Medici Weeks in Treatment: 17 Information Obtained from: Patient Chief Complaint Right AKA Ulcer at surgical incision Electronic Signature(s) Signed: 05/26/2019 9:28:31 AM By: Worthy Keeler PA-C Entered By: Worthy Keeler on 05/26/2019 09:28:30 [name] ([id number]) -------------------------------------------------------------------------------- Debridement Details Patient Name: [name], Brailen L. Date of Service: 05/26/2019 9:00 AM Medical Record Number: [id number] Patient Account Number: [id number] Date of Birth/Sex: [birth year] (58 y.o. M) Treating RN: [name] Primary Care Provider: [name] Other Clinician: Referring Provider: [name] Treating Provider/Extender: [name], Tanasha Menees Weeks in Treatment: 17 Debridement Performed for Wound #2 Left Amputation Site - Below Knee Assessment: Performed By: Physician STONE III, Omarion Minnehan E., PA-C Debridement Type: Debridement Severity of Tissue Pre Fat layer exposed Debridement: Level of Consciousness (Pre- Awake and Alert procedure): Pre-procedure Verification/Time Yes - 09:38 Out Taken: Start Time: 09:38 Pain Control: Lidocaine 4% Topical Solution Total Area Debrided (L x W): 0.4 (cm) x 1.5 (cm) = 0.6 (cm) Tissue and other material Viable, Non-Viable, Slough, Subcutaneous, Slough debrided: Level: Skin/Subcutaneous Tissue Debridement Description: Excisional Instrument: Curette Bleeding: Minimum Hemostasis Achieved: Pressure End Time: 09:40 Procedural Pain: 0 Post Procedural Pain: 0 Response  to Treatment: Procedure was tolerated well Level of Consciousness Awake and Alert (Post-procedure): Post Debridement Measurements of Total Wound Length: (cm) 0.5 Stage: Category/Stage III Width: (cm) 1.5 Depth: (cm) 0.4 Volume: (cm) 0.236 Character of Wound/Ulcer Post Improved Debridement: Severity of Tissue Post Debridement: Fat layer exposed Post Procedure Diagnosis Same as Pre-procedure Electronic Signature(s) Unsigned Entered By: [name] on 05/26/2019 09:40:24 Signature(s): Date(s): [name] ([id number]) [place], [name] ([id number]) -------------------------------------------------------------------------------- Physician Orders Details Patient Name: [name], [name] Date of Service: 05/26/2019 9:00 AM Medical Record Number: [id number] Patient Account Number: [id number] Date of Birth/Sex: [birth year] (58 y.o. 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 Wound #2 Left Amputation Site - Below Knee o Clean wound with Normal Saline. Anesthetic (add to Medication List) Wound #1 Right Amputation Site - Above Knee o Topical Lidocaine 4% cream applied to wound bed prior to  debridement (In Clinic Only). Wound #2 Left Amputation Site - Below Knee o Topical Lidocaine 4% cream applied to wound bed prior to debridement (In Clinic Only). Primary Wound Dressing Wound #1 Right Amputation Site - Above Knee o Santyl Ointment Wound #2 Left Amputation Site - Below Knee o Santyl Ointment Secondary Dressing Wound #1 Right Amputation Site - Above Knee o ABD pad o Boardered Foam Dressing Wound #2 Left Amputation Site - Below Knee o ABD pad o Boardered Foam Dressing Thier, Beauregard  L. ([id number]) Dressing Change Frequency Wound #1 Right Amputation Site - Above Knee o Change dressing every other day. Wound #2 Left Amputation Site - Below Knee o Change dressing every other day. Follow-up Appointments Wound #1 Right Amputation Site - Above Knee o Return Appointment in 2 weeks. Wound #2 Left Amputation Site - Below Knee o Return Appointment in 2 weeks. Electronic Signature(s) Unsigned Entered By: [name] on 05/26/2019 09:41:56 Signature(s): Date(s): [name] ([id number]) -------------------------------------------------------------------------------- Problem List Details Patient Name: [name], [name] Date of Service: 05/26/2019 9:00 AM Medical Record Number: [id number] Patient Account Number: [id number] Date of Birth/Sex: 01-15-1961 (58 y.o. 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

## 2019-06-09 ENCOUNTER — Other Ambulatory Visit: Payer: Self-pay

## 2019-06-09 ENCOUNTER — Encounter: Payer: Medicare Other | Admitting: Physician Assistant

## 2019-06-09 DIAGNOSIS — T8189XA Other complications of procedures, not elsewhere classified, initial encounter: Secondary | ICD-10-CM | POA: Diagnosis not present

## 2019-06-09 NOTE — Progress Notes (Addendum)
LAMOND, RORIE ([id number]) Visit Report for 06/09/2019 Chief Complaint Document Details Patient Name: [name], [name] Date of Service: 06/09/2019 9:00 AM Medical Record Number: [id number] Patient Account Number: [id number] Date of Birth/Sex: 09/20/1961 (57 y.o. 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 Date of Service: 06/09/2019 9:00 AM Medical Record Number: [id number] Patient Account Number: [id number] Date of Birth/Sex: Mar 17, 1961 (57 y.o. M) Treating RN: [name] Primary Care Provider: [name] Other Clinician: Referring Provider: [name] Treating Provider/Extender: [name], HOYT Weeks in Treatment: 19 Debridement Performed for Wound #2 Left Amputation Site - Below Knee Assessment: Performed By: Physician STONE III, HOYT E., PA-C Debridement Type: Debridement Severity of Tissue Pre Fat layer exposed Debridement: Level of Consciousness (Pre- Awake and Alert procedure): Pre-procedure Verification/Time Yes - 09:33 Out Taken: Start Time: 09:33 Pain Control: Lidocaine 4% Topical Solution Total Area Debrided (L x W): 0.4 (cm) x 1.3 (cm) = 0.52 (cm) Tissue and other material Viable, Non-Viable, Slough, Subcutaneous, Skin: Dermis , Skin: Epidermis, Slough debrided: Level: Skin/Subcutaneous Tissue Debridement Description: Excisional Instrument: Curette Bleeding: Minimum Hemostasis Achieved: Pressure End Time: 09:38 Procedural Pain:  0 Post Procedural Pain: 0 Response to Treatment: Procedure was tolerated well Level of Consciousness Awake and Alert (Post-procedure): Post Debridement Measurements of Total Wound Length: (cm) 0.5 Stage: Category/Stage III Width: (cm) 1.4 Depth: (cm) 0.4 Volume: (cm) 0.22 Character of Wound/Ulcer Post Improved Debridement: Severity of Tissue Post Debridement: Fat layer exposed Post Procedure Diagnosis Same as Pre-procedure Electronic Signature(s) Signed: 06/09/2019 4:25:23 PM By: [name] Signed: 06/10/2019 11:36:49 PM By: Worthy Keeler PA-C Entered By: [name] on 06/09/2019 09:38:07 [name], [name] ([id number]) -------------------------------------------------------------------------------- HPI Details Patient Name: [name], [name] Date of Service: 06/09/2019 9:00 AM Medical Record Number: [id number] Patient Account Number: [id number] Date of Birth/Sex: 03/04/1961 (57 y.o. M) Treating RN: [name] Primary Care Provider: [name] Other Clinician: Referring Provider: [name] Treating Provider/Extender: [name], HOYT Weeks in Treatment: 19 History of Present Illness HPI Description: 01/27/19 patient presents today for initial evaluation here in our clinic concerning an issue he's been having with a wound at the right above knee amputation site which began shortly following the surgery in November 2019. 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 He has a history of hypertension, diabetes, congestive heart failure, and states that he is having a lot of discomfort at the wound site unfortunately. There does not appear to be any signs of active infection upon initial inspection today. No fevers, chills, nausea, or vomiting noted at this time.. Silvadene Cream has  been utilized at some point on this wound as well. Patient did have home health coming out but unfortunately the patient and his wife state that they were not happy with the services being rendered and therefore discontinued the service. There does not appear to be any signs of otherwise active infection at this time which is good news. The patient states he's definitely ready for this to heal  and has been a symptomatic time since his surgery and he is becoming somewhat frustrated. Iodoflex also sounds to have previously been used on the wound that is when the patient's wife states the wound seem to get worse not better. As best I can tell [name] has not been utilized. 02/03/19 on evaluation today patient appears to be doing much better compared to last week's evaluation in regard to his ulcer on the right amputation site. He's been tolerating the dressing changes without complication which is good news. The Santyl does seem to be of benefit in fact the wound appears to be doing significantly better compared to my prior evaluation. Overall very pleased in this regard. No fevers, chills, nausea, or vomiting noted at this time. 02/10/19 on evaluation today patient's wound actually appears to be showing signs of excellent improvement at this time which is great news. He has been tolerating the dressing changes without complication overall I feel like that there is no signs of active infection at this point which is also good news. I feel like that the [name] is very beneficial. Patient's wife likewise feels like things are doing quite well and is not having pain like he was in the past. 02/17/19 on evaluation today patient appears to be doing well in regard to the wound on the distal aspect of his right amputation site. He's been tolerating treatment with the Santyl at this point. I think he is nearing completion as far as treatment with cental is concerned I think were very close to getting this  significantly better. Fortunately there's no evidence of infection currently. 03/03/19 on evaluation today patient actually appears to be doing quite well in regard to his his right imputation site ulceration. In fact there's a lot of new skin growth which is excellent news. I'm very pleased in this regard. Fortunately there's no signs of active infection also good news. No fevers, chills, nausea, or vomiting noted at this time. 03/17/19 on evaluation today patient's wound actually appears to be showing signs of improvement which is good news. He does have a lot of issues however with concerns about how slowly this seems to be healing as far as he and his wife are concerned. I do believe some of the issues that were having currently are from the language barrier perspective but even that we have an interpreter that has been present for each evaluation I still think that there is some issues with their overall understanding of the treatment plan and how long wounds of this nature do take to heal. We began seeing him on April 14 and in just over a month and a half he has made improvement to where the wound is one third the size originally began. Nonetheless there still quite a bit of feeling left to do but again he has made great progress up to this point. Unfortunately he did have some issues with the silver collagen that was ordered he received something different from the [employer]. This was Colactive. Unfortunately this actually calls some burning he seemed to have some irritation from the dressing itself that caused irritation and redness around the edge of the wound that seems to be improving but nonetheless he and his wife both believe that the Santyl might've done better. They also really want to try to utilize something that will prevent me having to degree the wound as he typically has discomfort with the debridement obviously. 03/31/19 on evaluation today patient actually appears to be doing better  in  regard to his lower extremity ulcer. He's been tolerating the dressing changes without complication. Fortunately there's no signs of active infection at this time. He has a lot of new skin growth in the Santyl does seem to be working appropriately for him at this time. I'm very pleased in this regard. [name], [name] ([id number]) 04/14/19 on evaluation today patient appears to be doing quite well in regard to his invitation site ulcer. This is a little smaller even than half the size of where it was when I saw him last. Fortunately there is no sign of active infection at this time which is good news. Overall he has been tolerating the dressing changes without complication still states that he has a lot of pain but again I really cannot see anything with regard to the wound that would be causing this and maybe more for neuropathy type pain. He states his weeks and up at night sometimes. 04/28/19-Patient presents at 2 weeks, with the right AKA wound, we have been using Santyl for this and there was mention of changing it to a different dressing this time around. 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 He's been tolerating the dressing changes with the Santyl unfortunately when we switched to the collagen dressing again last time he was seen in the office when I wasn't here his wound bed deteriorated as well in the following days. His wife came in to discuss this with me and subsequently we opted to go back to the [place] which seems to be doing excellent for him at this point. 05/26/2019 upon evaluation today patient actually appears to be doing excellent in regard to his wound on the right amputation site above the knee. In fact this appears to be almost completely healed which is great news. He has been utilizing Santyl. However he does have an issue on his left  below-knee amputation stump where he has a wound as well he did not want to show me his wife wanted him to. In the end he decided he would and we did evaluate that today as well. It is not a very deep wound but nonetheless is something that I think needs to be addressed sooner rather than later to prevent any additional issues at this point. 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 There may still be just a slight area where there some drainage but I really feel like most of the moisture is likely coming from the [place]. With that being said his left BKA ulcer is not doing quite as well there still a lot of Slough noted at this time and again there is some Epiboly as well this would require sharp debridement today. Electronic Signature(s) Signed: 06/10/2019 11:36:49 PM By: Worthy Keeler PA-C Entered By: Worthy Keeler on 06/09/2019 23:25:32 [name] ([id number]) -------------------------------------------------------------------------------- Physical Exam Details Patient Name: [name], [name] Date of Service: 06/09/2019 9:00 AM Medical Record Number: [id number] Patient Account Number: [id number] Date of Birth/Sex: 1961/09/14 (57 y.o. M) Treating RN: [name] Primary Care Provider: [name] Other Clinician: Referring Provider: [name] Treating Provider/Extender: STONE III, HOYT Weeks in Treatment: 42 Constitutional Well-nourished and well-hydrated in no acute distress. Respiratory normal breathing without difficulty. clear to auscultation bilaterally. Cardiovascular regular rate and rhythm with normal S1, S2. Psychiatric this patient is able  to make decisions and demonstrates good insight into disease process. Alert and Oriented x 3. pleasant and cooperative. Notes Upon inspection today patient's wound again on the right seems to be I believe completely healed although  there may just be a very thin layer of skin on the surface of the wound location previously noted. Nonetheless I think that I'm gonna watch this one more week before calling it completely healed although I don't believe he's gonna require Santyl at this point. With regard to the left BKA ulcer this actually did require significant sharp agreement to remove necrotic tissue from the surface of the wound down to good subcutaneous tissue along with removal of the Epiboly around the edge of the wound. He tolerated this today without complication and no significant pain which is great news. Electronic Signature(s) Signed: 06/10/2019 11:36:49 PM By: Worthy Keeler PA-C Entered By: Worthy Keeler on 06/09/2019 23:26:34 [name] ([id number]) -------------------------------------------------------------------------------- Physician Orders Details Patient Name: [name], Detrick L. Date of Service: 06/09/2019 9:00 AM Medical Record Number: [id number] Patient Account Number: [id number] Date of Birth/Sex: 14-Dec-1960 (57 y.o. 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 Wound #2 Left Amputation Site - Below Knee o Clean wound with Normal Saline. Anesthetic (add to Medication List) Wound #1 Right Amputation Site - Above Knee o Topical Lidocaine 4% cream applied to wound bed prior to debridement (In Clinic Only). Wound #2 Left Amputation Site - Below Knee o Topical Lidocaine 4% cream applied to wound bed prior to debridement (In Clinic Only). Primary Wound Dressing Wound #1 Right Amputation Site - Above Knee o Silver Alginate Wound #2 Left Amputation Site - Below Knee o Santyl Ointment Secondary Dressing Wound #1 Right Amputation Site - Above Knee o ABD pad o Boardered Foam Dressing [name], [name] ([id number]) Wound #2 Left Amputation Site - Below Knee o ABD pad o Boardered Foam Dressing Dressing Change Frequency Wound #1 Right Amputation Site - Above Knee o Change dressing every other day. Wound #2 Left Amputation Site - Below Knee o Change dressing every other day. Follow-up Appointments Wound #1 Right Amputation Site - Above Knee o Return Appointment in 2 weeks. Wound #2 Left Amputation Site - Below Knee o Return Appointment in 2 weeks. Electronic Signature(s) Signed: 06/09/2019 4:25:23 PM By: [name] Signed: 06/10/2019 11:36:49 PM By: Worthy Keeler PA-C Entered By: [name] on 06/09/2019 09:28:57 [name] ([id number]) -------------------------------------------------------------------------------- Problem List Details Patient Name: [name], [name] Date of Service: 06/09/2019 9:00 AM Medical Record Number: [id number] Patient Account Number: [id number] Date of Birth/Sex: 1961-03-25 (57 y.o. 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 01/27/2019 No Yes Z89.512 Acquired absence of left leg below knee 05/26/2019 No Yes L97.822 Non-pressure chronic ulcer of other part of left lower leg with 05/26/2019 No Yes fat layer exposed I73.89 Other specified peripheral vascular diseases 01/27/2019 No Yes E11.622 Type 2 diabetes mellitus with other skin ulcer 01/27/2019 No Yes I50.42 Chronic combined systolic (congestive) and diastolic [id number] No Yes (congestive) heart failure N18.6 End stage renal disease 01/27/2019 No Yes Z99.2 Dependence on renal dialysis 01/27/2019 No Yes I10 Essential (primary) hypertension 01/27/2019 No Yes Harshbarger, Gerrett L. ([id number]) I48.0 Paroxysmal atrial fibrillation 01/27/2019 No Yes Inactive Problems Resolved Problems Electronic Signature(s) Signed: 06/09/2019 9:19:33 AM By: Worthy Keeler PA-C Entered By: Worthy Keeler on 06/09/2019 09:19:33 Antwine, [name] ([id number]) -------------------------------------------------------------------------------- Progress Note Details Patient Name: [name], [name] Date of Service: 06/09/2019 9:00 AM Medical Record Number: [id number] Patient Account Number: [id number] Date of Birth/Sex: 12-27-1960 (57 y.o. M) Treating RN: [name] Primary Care Provider: [name] Other Clinician: Referring Provider: [name] Treating Provider/Extender: [name], HOYT Weeks in Treatment: 19 Subjective Chief Complaint Information obtained from Patient Right AKA Ulcer at surgical incision History of Present Illness (HPI) 01/27/19 patient presents today for initial evaluation here in our clinic concerning an issue he's been having with a wound at the right above knee amputation site which began shortly following the surgery in  November 2019. 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 He has a history of hypertension, diabetes, congestive heart failure, and states that he is having a lot of discomfort at the wound site unfortunately. There does not appear to be any signs of active infection upon initial inspection today. No fevers, chills, nausea, or vomiting noted at this time.. Silvadene Cream has been utilized at some point on this wound as well. Patient did have home health coming out but unfortunately the patient and his wife state that they were not happy with the services being rendered and therefore discontinued the service. There does not appear to be any signs of otherwise active infection at this time which is good news. The patient states he's definitely ready for this to heal and has been a symptomatic time since his surgery and he is becoming somewhat frustrated. Iodoflex also sounds to have previously been used on the wound that is when the patient's wife states the wound seem to get worse not better. As best I can tell [name] has not been utilized. 02/03/19 on evaluation today patient appears to be doing much better compared to last week's evaluation in regard to his ulcer on the right amputation site. He's been tolerating the dressing changes without complication which is good news. The Santyl does seem to be of benefit in fact the wound appears to be doing significantly better compared to my prior evaluation. Overall very pleased in this regard. No fevers, chills, nausea, or vomiting noted at this time. 02/10/19 on evaluation today patient's wound actually appears to be showing signs of excellent improvement at this time which is great news. He has been tolerating the dressing changes without complication overall I feel  like that there is no signs of active infection at this point which is also good news. I feel like that the [name] is very beneficial. Patient's wife likewise feels like things are doing quite well and is not having pain like he was in the past. 02/17/19 on evaluation today patient appears to be doing well in regard to the wound on the distal aspect of his right amputation site. He's been tolerating treatment with the Santyl at this point. I think he is nearing completion as far as treatment with cental is concerned I think were very close to getting this significantly better. Fortunately there's no evidence of infection currently. 03/03/19 on evaluation today patient actually appears to be doing quite well in regard to his his right imputation site ulceration. In fact there's a lot of new skin growth which is excellent news. I'm very pleased in this regard. Fortunately there's no signs of active infection also good news. No fevers, chills, nausea, or vomiting noted at this time. 03/17/19 on evaluation today patient's wound actually appears to be showing signs of improvement which is good news. He does have a lot of issues however with concerns about how slowly this seems to be healing as far as he and his wife are concerned. I do believe some of the issues that were having currently are from the language barrier perspective but even that we have an interpreter that has been present for each evaluation I still think that there is some issues with their overall understanding of the treatment plan and how long wounds of this nature do take to heal. We began seeing him on April 14 and in just over a month and a half he has made improvement to where the wound is one third the size originally began. Nonetheless there still quite a bit of feeling left to do but again he has made great progress up to this point. Unfortunately he did have some issues with the silver collagen that was ordered he received something  different from the [employer]. This was Colactive. Unfortunately this actually calls some burning he seemed to have some irritation from the dressing itself that caused irritation and redness around the edge of the wound that seems to be improving but nonetheless he and his wife both [name], [name] ([id number]) believe that the Santyl might've done better. They also really want to try to utilize something that will prevent me having to degree the wound as he typically has discomfort with the debridement obviously. 03/31/19 on evaluation today patient actually appears to be doing better in regard to his lower extremity ulcer. He's been tolerating the dressing changes without complication. Fortunately there's no signs of active infection at this time. He has a lot of new skin growth in the Santyl does seem to be working appropriately for him at this time. I'm very pleased in this regard. 04/14/19 on evaluation today patient appears to be doing quite well in regard to his invitation site ulcer. This is a little smaller even than half the size of where it was when I saw him last. Fortunately there is no sign of active infection at this time which is good news. Overall he has been tolerating the dressing changes without complication still states that he has a lot of pain but again I really cannot see anything with regard to the wound that would be causing this and maybe more for neuropathy type pain. He states his weeks and up at night sometimes. 04/28/19-Patient presents at  2 weeks, with the right AKA wound, we have been using Santyl for this and there was mention of changing it to a different dressing this time around. 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 He's been tolerating the dressing changes with the Santyl unfortunately when we switched to the collagen  dressing again last time he was seen in the office when I wasn't here his wound bed deteriorated as well in the following days. His wife came in to discuss this with me and subsequently we opted to go back to the [place] which seems to be doing excellent for him at this point. 05/26/2019 upon evaluation today patient actually appears to be doing excellent in regard to his wound on the right amputation site above the knee. In fact this appears to be almost completely healed which is great news. He has been utilizing Santyl. However he does have an issue on his left below-knee amputation stump where he has a wound as well he did not want to show me his wife wanted him to. In the end he decided he would and we did evaluate that today as well. It is not a very deep wound but nonetheless is something that I think needs to be addressed sooner rather than later to prevent any additional issues at this point. 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 There may still be just a slight area where there some drainage but I really feel like most of the moisture is likely coming from the [place]. With that being said his left BKA ulcer is not doing quite as well there still a lot of Slough noted at this time and again there is some Epiboly as well this would require sharp debridement today. Patient History Information obtained from Patient. Family History Cancer - Father, Diabetes - Siblings, No family history of Hereditary Spherocytosis. Social History Former smoker - ended on 10/16/1994, Marital Status - Married, Alcohol Use - Never, Drug Use - No History, Caffeine Use - Rarely. Medical History Cardiovascular Patient has history of Arrhythmia - A-fib, Congestive Heart Failure, Hypertension, Peripheral Arterial Disease - Bilateral LE amputations Endocrine Patient has history of Type II Diabetes Genitourinary Patient  has history of End Stage Renal Disease - Dialysis M,W,F Hospitalization/Surgery History - Right AKA. - Left BKA. 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 ([id number]) Review of Systems (ROS) Constitutional Symptoms (General Health) Denies complaints or symptoms of Fatigue, Fever, Chills, Marked Weight Change. Respiratory Denies complaints or symptoms of Chronic or frequent coughs, Shortness of Breath. Cardiovascular Denies complaints or symptoms of Chest pain, LE edema. Psychiatric Denies complaints or symptoms of Anxiety, Claustrophobia. Objective Constitutional Well-nourished and well-hydrated in no acute distress. Vitals Time Taken: 9:06 AM, Height: 67 in, Weight: 220 lbs, BMI: 34.5, Temperature: 98.6 F, Pulse: 76 bpm, Respiratory Rate: 18 breaths/min, Blood Pressure: 98/50 mmHg. Respiratory normal breathing without difficulty. clear to auscultation bilaterally. Cardiovascular regular rate and rhythm with normal S1, S2. Psychiatric this patient is able to make decisions and demonstrates good insight into disease process. Alert and Oriented x 3. pleasant and cooperative. General Notes: Upon inspection today patient's wound again on the right seems to be I believe completely healed although there may just be  a very thin layer of skin on the surface of the wound location previously noted. Nonetheless I think that I'm gonna watch this one more week before calling it completely healed although I don't believe he's gonna require Santyl at this point. With regard to the left BKA ulcer this actually did require significant sharp agreement to remove necrotic tissue from the surface of the wound down to good subcutaneous tissue along with removal of the Epiboly around the edge of the wound. He tolerated this today without complication and no significant pain which  is great news. Integumentary (Hair, Skin) Wound #1 status is Open. Original cause of wound was Surgical Injury. The wound is located on the Right Amputation Site - Above Knee. The wound measures 1cm length x 1cm width x 0.1cm depth; 0.785cm^2 area and 0.079cm^3 volume. There is Fat Layer (Subcutaneous Tissue) Exposed exposed. There is no tunneling or undermining noted. There is a medium amount of serosanguineous drainage noted. The wound margin is thickened. There is large (67-100%) red, hyper - granulation within the wound bed. There is a small (1-33%) amount of necrotic tissue within the wound bed including Eschar and Adherent Slough. Wound #2 status is Open. Original cause of wound was Pressure Injury. The wound is located on the Left Amputation Site - Below Knee. The wound measures 0.4cm length x 1.3cm width x 0.3cm depth; 0.408cm^2 area and 0.123cm^3 volume. There is Fat Layer (Subcutaneous Tissue) Exposed exposed. There is no tunneling or undermining noted. There is a medium amount of serous drainage noted. The wound margin is flat and intact. There is small (1-33%) pink granulation within the Tucker, [name]:[id number]) wound bed. There is a large (67-100%) amount of necrotic tissue within the wound bed including Adherent Slough. 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 There was a Excisional Skin/Subcutaneous Tissue Debridement with a total area of 0.52 sq cm performed by STONE III, HOYT E., PA-C. With the following instrument(s): Curette to remove Viable and Non-Viable tissue/material. Material removed includes Subcutaneous Tissue, Slough, Skin: Dermis, and Skin: Epidermis after achieving pain control using Lidocaine 4% Topical Solution. No specimens were taken. A time out was conducted at 09:33, prior to the start of the procedure. A Minimum amount of bleeding was controlled with Pressure. The procedure was tolerated well with a pain level of 0 throughout and a pain level of 0 following the procedure. Post Debridement Measurements: 0.5cm length x 1.4cm width x 0.4cm depth; 0.22cm^3 volume. Post debridement Stage noted as Category/Stage III. Character of Wound/Ulcer Post Debridement is improved. Severity of Tissue Post Debridement is: Fat layer exposed. Post procedure Diagnosis Wound #2: Same as Pre-Procedure Plan Wound Cleansing: Wound #1 Right Amputation Site - Above Knee: Clean wound with Normal Saline. Wound #2 Left Amputation Site - Below Knee: Clean wound with Normal Saline. Anesthetic (add to Medication List): Wound #1 Right Amputation Site - Above  Knee: Topical Lidocaine 4% cream applied to wound bed prior to debridement (In Clinic Only). Wound #2 Left Amputation Site - Below Knee: Topical Lidocaine 4% cream applied to wound bed prior to debridement (In Clinic Only). LUAL, BELAIRE ([id number]) Primary Wound Dressing: Wound #1 Right Amputation Site - Above Knee: Silver Alginate Wound #2 Left Amputation Site - Below Knee: Santyl Ointment Secondary Dressing: Wound #1 Right Amputation Site - Above Knee: ABD pad Boardered Foam Dressing Wound #2 Left Amputation Site - Below Knee: ABD pad Boardered Foam Dressing Dressing Change Frequency: Wound #1  Right Amputation Site - Above Knee: Change dressing every other day. Wound #2 Left Amputation Site - Below Knee: Change dressing every other day. Follow-up Appointments: Wound #1 Right Amputation Site - Above Knee: Return Appointment in 2 weeks. Wound #2 Left Amputation Site - Below Knee: Return Appointment in 2 weeks. 1. My suggestion currently is gonna be that we go ahead and just use and I was in the dressing over the right ulcer site just to ensure that there is nothing draining and if everything appears to be doing well and continues to look as good as it does right now I think we can discontinue dressings on the right at that point when I see him back for follow-up. 2. With regard to the ulcer on the left and gonna suggest we continue with the Santyl for the time being I think this is still the best option here and we will see were things stand again when I see him back next week for follow-up. Please see above for specific wound care orders. We will see patient for re-evaluation in 2 week(s) here in the clinic. If anything worsens or changes patient will contact our office for additional recommendations. Electronic Signature(s) Signed: 06/10/2019 11:36:49 PM By: Worthy Keeler PA-C Entered By: Worthy Keeler on 06/09/2019 23:27:43 [name] ([id number]) -------------------------------------------------------------------------------- ROS/PFSH Details Patient Name: [name], Cari L. Date of Service: 06/09/2019 9:00 AM Medical Record Number: [id number] Patient Account Number: [id number] Date of Birth/Sex: 1961/07/01 (57 y.o. 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 Respiratory Complaints and Symptoms: Negative for: Chronic or frequent coughs; Shortness of Breath Cardiovascular Complaints and Symptoms: Negative for: Chest pain; LE edema Medical History: Positive for: Arrhythmia - A-fib; Congestive Heart Failure; Hypertension; Peripheral Arterial Disease - Bilateral LE amputations Psychiatric Complaints and Symptoms: Negative for: Anxiety; Claustrophobia Endocrine Medical History: Positive for: Type II Diabetes Time with diabetes: 14 years Treated with: Oral agents Blood sugar tested every day: No Blood sugar testing results: Breakfast: 128; Lunch: 145 Genitourinary Medical History: Positive for: End Stage Renal Disease - Dialysis M,W,F Immunizations ERIKA, [place]. ([id number]) Pneumococcal Vaccine: Received Pneumococcal Vaccination: Yes Implantable Devices None Hospitalization / Surgery History Type of Hospitalization/Surgery Right AKA Left BKA Family and Social History Cancer: Yes - Father; Diabetes: Yes - Siblings; Hereditary Spherocytosis: No; Former smoker - ended on 10/16/1994; Marital Status - Married; Alcohol Use: Never; Drug Use: No History; Caffeine Use: Rarely Physician Affirmation I have reviewed and agree with the above information. Electronic Signature(s) Signed: 06/10/2019 4:31:31 PM By: [name] Signed: 06/10/2019 11:36:49 PM By: Worthy Keeler PA-C Entered By:  Worthy Keeler on 06/09/2019 23:25:54 MANNING, WILBANKS ([id number]) -------------------------------------------------------------------------------- [place] Details Patient Name: LUNDE, Fed L. Date of Service: 06/09/2019 Medical Record Number: [id number] Patient Account Number: [id number] Date of Birth/Sex: [birth year] (58 y.o. M) Treating RN: [name] Primary Care Provider: [name] Other Clinician: Referring Provider:  [name] Treating Provider/Extender: [name], HOYT Weeks in Treatment: 19 Diagnosis Coding ICD-10 Codes Code Description [phone] Non-pressure chronic ulcer of other part of right lower leg with fat layer exposed Z89.611 Acquired absence of right leg above knee Z89.512 Acquired absence of left leg below knee L97.822 Non-pressure chronic ulcer of other part of left lower leg with fat layer exposed I73.89 Other specified peripheral vascular diseases E11.622 Type 2 diabetes mellitus with other skin ulcer I50.42 Chronic combined systolic (congestive) and diastolic (congestive) heart failure N18.6 End stage renal disease Z99.2 Dependence on renal dialysis I10 Essential (primary) hypertension I48.0 Paroxysmal atrial fibrillation Facility Procedures CPT4 Code Description: [id number] - DEB SUBQ TISSUE 20 SQ CM/< ICD-10 Diagnosis Description L97.822 Non-pressure chronic ulcer of other part of left lower leg wit Modifier: h fat layer expos Quantity: 1 ed Physician Procedures CPT4 Code Description: [id number] - WC PHYS SUBQ TISS 20 SQ CM ICD-10 Diagnosis Description L97.822 Non-pressure chronic ulcer of other part of left lower leg wit Modifier: h fat layer expos Quantity: 1 ed Electronic Signature(s) Signed: 06/10/2019 11:36:49 PM By: Worthy Keeler PA-C Entered By: Worthy Keeler on 06/09/2019 23:28:08

## 2019-06-09 NOTE — Progress Notes (Signed)
[name] ([id number]) Visit Report for 06/09/2019 Arrival Information Details Patient Name: [name], [name] Date of Service: 06/09/2019 9:00 AM Medical Record Number: [id number] Patient Account Number: [id number] Date of Birth/Sex: 1961-04-25 (57 y.o. M) Treating RN: Harold Barban Primary Care Renold Kozar: [name] Other Clinician: Referring Arrin Ishler: [name] Treating Mechille Varghese/Extender: [name], HOYT Weeks in Treatment: 53 Visit Information History Since Last Visit Added or deleted any medications: No Patient Arrived: Wheel Chair Any new allergies or adverse reactions: No Arrival Time: 09:05 Had a fall or experienced change in No Accompanied By: wife activities of daily living that may affect Transfer Assistance: None risk of falls: Patient Identification Verified: Yes Signs or symptoms of abuse/neglect since last visito No Secondary Verification Process Completed: Yes Hospitalized since last visit: No Patient Has Alerts: Yes Has Dressing in Place as Prescribed: Yes Patient Alerts: Type II Diabetic Pain Present Now: No Aspirin 81mg  Electronic Signature(s) Signed: 06/09/2019 4:07:54 PM By: Harold Barban Entered By: Harold Barban on 06/09/2019 09:06:14 [name] ([id number]) -------------------------------------------------------------------------------- Encounter Discharge Information Details Patient Name: [name], [name] Date of Service: 06/09/2019 9:00 AM Medical Record Number: [id number] Patient Account Number: [id number] Date of Birth/Sex: 27-Nov-1960 (57 y.o. M) Treating RN: [name] Primary Care Kayne Yuhas: [name] Other Clinician: Referring Alexios Keown: [name] Treating Kazaria Gaertner/Extender: [name], HOYT Weeks in Treatment: 76 Encounter Discharge Information Items Post Procedure Vitals Discharge Condition: Stable Temperature (F): 98.6 Ambulatory Status: Wheelchair Pulse (bpm): 76 Discharge Destination: Home Respiratory Rate  (breaths/min): 16 Transportation: Private Auto Blood Pressure (mmHg): 98/50 Accompanied By: wife Schedule Follow-up Appointment: Yes Clinical Summary of Care: Electronic Signature(s) Signed: 06/09/2019 4:25:23 PM By: [name] Entered By: [name] on 06/09/2019 09:39:40 [name] ([id number]) -------------------------------------------------------------------------------- Lower Extremity Assessment Details Patient Name: [name], [name] Date of Service: 06/09/2019 9:00 AM Medical Record Number: [id number] Patient Account Number: [id number] Date of Birth/Sex: 12/25/1960 (57 y.o. M) Treating RN: Harold Barban Primary Care Mariapaula Krist: [name] Other Clinician: Referring Azka Steger: [name] Treating Kristianna Saperstein/Extender: [name], HOYT Weeks in Treatment: 19 Electronic Signature(s) Signed: 06/09/2019 4:07:54 PM By: Harold Barban Entered By: Harold Barban on 06/09/2019 09:17:56 [name] ([id number]) -------------------------------------------------------------------------------- Multi Wound Chart Details Patient Name: [name], [name] Date of Service: 06/09/2019 9:00 AM Medical Record Number: [id number] Patient Account Number: [id number] Date of Birth/Sex: 29-Apr-1961 (57 y.o. 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/A Lower Extremity  Lower Extremity Comorbid History: Arrhythmia, Congestive Heart Arrhythmia, Congestive Heart N/A Failure, Hypertension, Failure, Hypertension, Peripheral Arterial Disease, Peripheral Arterial Disease, Type II Diabetes, End Stage Type II Diabetes, End Stage Renal Disease Renal Disease Date Acquired: 09/09/2018 04/25/2019 N/A Weeks of Treatment: 19 2 N/A Wound Status: Open Open N/A Measurements L x W x D 1x1x0.1 0.4x1.3x0.3 N/A (cm) Area (cm) : 0.785 0.408 N/A Volume (cm) : 0.079 0.123 N/A % Reduction in Area: 94.90% 13.40% N/A % Reduction in Volume: 99.10% 12.80% N/A Classification: Full Thickness Without Category/Stage III N/A Exposed Support Structures Exudate Amount: Medium Medium N/A Exudate Type: Serosanguineous Serous N/A  Exudate Color: red, brown amber N/A Wound Margin: Thickened Flat and Intact N/A Granulation Amount: Large (67-100%) Small (1-33%) N/A Granulation Quality: Red, Hyper-granulation Pink N/A Necrotic Amount: Small (1-33%) Large (67-100%) N/A Cragle, Xaidyn L. ([id number]) Necrotic Tissue: Eschar, Adherent [facility] N/A Exposed Structures: Fat Layer (Subcutaneous Fat Layer (Subcutaneous N/A Tissue) Exposed: Yes Tissue) Exposed: Yes Fascia: No Fascia: No Tendon: No Tendon: No Muscle: No Muscle: No Joint: No Joint: No Bone: No Bone: No Epithelialization: Small (1-33%) None N/A Treatment Notes Electronic Signature(s) Signed: 06/09/2019 4:25:23 PM By: [name] Entered By: [name] on 06/09/2019 09:25:33 [name] ([id number]) -------------------------------------------------------------------------------- [place] Details Patient Name: [name], [name] Date of Service: 06/09/2019 9:00 AM Medical Record Number: [id number] Patient Account Number: [id number] Date of Birth/Sex: 1961/09/11 (57 y.o. M) Treating RN: [name] Primary Care Raghav Verrilli: [name] Other Clinician: Referring Klayten Jolliff: [name] Treating Shalice Woodring/Extender: [name], HOYT Weeks in Treatment: 54 Active Inactive Abuse / Safety / Falls / Self Care Management Nursing Diagnoses: Impaired physical mobility Goals: Patient will not develop complications from immobility Date Initiated: 01/27/2019 Target Resolution Date: 04/25/2019 Goal Status: Active Interventions: Assess fall risk on admission and as needed Notes: Orientation to the Wound Care Program Nursing Diagnoses: Knowledge deficit related to the wound healing center program Goals: Patient/caregiver will verbalize understanding of the [facility] Date Initiated: 01/27/2019 Target Resolution Date: 04/25/2019 Goal Status: Active Interventions: Provide education on orientation to the wound center Notes: Pain, Acute or Chronic Nursing Diagnoses: Pain, acute or chronic: actual or potential Goals: Patient will verbalize adequate pain control and receive pain control interventions during procedures as needed Date Initiated: 01/27/2019 Target Resolution Date: 04/25/2019 Goal Status: Active Interventions: Complete pain assessment as per visit requirements AUSTINE, WERITO. ([id number]) Notes: Wound/Skin Impairment Nursing Diagnoses: Impaired tissue integrity Goals: Ulcer/skin breakdown will heal within 14 weeks Date Initiated: 01/27/2019 Target Resolution Date: 04/25/2019 Goal Status: Active Interventions: Assess patient/caregiver ability to obtain necessary supplies Assess patient/caregiver ability to perform ulcer/skin care regimen upon admission and as needed Assess ulceration(s) every visit Notes: Electronic Signature(s) Signed: 06/09/2019 4:25:23 PM By: [name] Entered By: [name] on 06/09/2019 09:24:44 Archila, Izora [name] ([id number]) -------------------------------------------------------------------------------- Pain Assessment Details Patient Name: [name], [name] Date of Service: 06/09/2019 9:00 AM Medical Record  Number: [id number] Patient Account Number: [id number] Date of Birth/Sex: 1961-06-19 (57 y.o. M) Treating RN: Harold Barban Primary Care Richetta Cubillos: [name] Other Clinician: Referring Rease Swinson: [name] Treating Mally Gavina/Extender: STONE III, HOYT Weeks in Treatment: 19 Active Problems Location of Pain Severity and Description of Pain Patient Has Paino No Site Locations Pain Management and Medication Current Pain Management: Electronic Signature(s) Signed: 06/09/2019 4:07:54 PM By: Harold Barban Entered By: Harold Barban on 06/09/2019 09:06:20 [name] ([id number]) -------------------------------------------------------------------------------- Patient/Caregiver Education Details Patient Name: [name], Sully L. Date of Service: 06/09/2019 9:00 AM Medical Record Number: [id number] Patient Account Number: [id number] Date of Birth/Gender: 08-Sep-1961 (58 y.o. M) Treating RN: [name] Primary Care Physician: [name] Other Clinician: Referring Physician: [name] Treating Physician/Extender: Sharalyn Ink in Treatment: 53 Education Assessment Education Provided To: Patient and Caregiver Education Topics Provided Wound/Skin Impairment: Handouts: Other: wound care as ordered Methods: Demonstration, Explain/Verbal Responses: State content correctly Electronic Signature(s) Signed: 06/09/2019 4:25:23 PM By: [name] Entered By: [name] on 06/09/2019 09:38:28 [name] ([id number]) -------------------------------------------------------------------------------- Wound Assessment Details Patient Name: Schoenberg, [name] Date of Service: 06/09/2019 9:00 AM Medical Record Number: [id number] Patient Account Number: [id number] Date  of Birth/Sex: 04-12-1961 (58 y.o. 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 Event: Surgical Injury Etiology: Date Acquired: 09/09/2018 Wound Open Weeks Of Treatment: 19 Status: Clustered Wound: No Comorbid Arrhythmia, Congestive Heart Failure, History: Hypertension, Peripheral Arterial Disease, Type II Diabetes, End Stage Renal Disease Photos Wound Measurements Length: (cm) 1 Width: (cm) 1 Depth: (cm) 0.1 Area: (cm) 0.785 Volume: (cm) 0.079 % Reduction in Area: 94.9% % Reduction in Volume: 99.1% Epithelialization: Small (1-33%) Tunneling: No Undermining: No Wound Description Full Thickness Without Exposed Support Foul O Classification: Structures Slough Wound Margin: Thickened Exudate Medium Amount: Exudate Type: Serosanguineous Exudate Color: red, brown dor After Cleansing: No /Fibrino Yes Wound Bed Granulation Amount: Large (67-100%) Exposed Structure Granulation Quality: Red, Hyper-granulation Fascia Exposed: No Necrotic Amount: Small (1-33%) Fat Layer (Subcutaneous Tissue) Exposed: Yes Necrotic Quality: Eschar, Adherent Slough Tendon Exposed: No Muscle Exposed: No Joint Exposed: No [name], [name] ([id number]) Bone Exposed: No Treatment Notes Wound #1 (Right Amputation Site - Above Knee) Notes R - silvercel, BFD; L - santyl and BFD Electronic Signature(s) Signed: 06/09/2019 4:07:54 PM By: Harold Barban Entered By: Harold Barban on 06/09/2019 09:17:13 Alling, Izora [name] ([id number]) -------------------------------------------------------------------------------- Wound Assessment Details Patient Name: [name], [name] Date of Service: 06/09/2019 9:00 AM Medical Record Number: [id number] Patient Account Number: [id number] Date of Birth/Sex: 1961/02/02 (57 y.o. 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 Event: Pressure Injury Etiology: Date Acquired: 04/25/2019 Wound Open Weeks Of Treatment: 2 Status: Clustered Wound: No Comorbid Arrhythmia, Congestive Heart Failure, History: Hypertension, Peripheral Arterial Disease, Type II Diabetes, End Stage Renal Disease Photos Wound Measurements Length: (cm) 0.4 Width: (cm) 1.3 Depth: (cm) 0.3 Area: (cm) 0.408 Volume: (cm) 0.123 % Reduction in Area: 13.4% % Reduction in Volume: 12.8% Epithelialization: None Tunneling: No Undermining: No Wound Description Classification: Category/Stage III Wound Margin: Flat and Intact Exudate Amount: Medium Exudate Type: Serous Exudate Color: amber Foul Odor After Cleansing: No Slough/Fibrino Yes Wound Bed Granulation Amount: Small (1-33%) Exposed Structure Granulation Quality: Pink Fascia Exposed: No Necrotic Amount: Large (67-100%) Fat Layer (Subcutaneous Tissue) Exposed: Yes Necrotic Quality: Adherent Slough Tendon Exposed: No Muscle Exposed: No Joint Exposed: No Bone Exposed: No [name], [name] ([id number]) Treatment Notes Wound #2 (Left Amputation Site - Below Knee) Notes R - silvercel, BFD; L - santyl and BFD Electronic Signature(s) Signed: 06/09/2019 4:07:54 PM By: Harold Barban Entered By: Harold Barban on 06/09/2019 09:17:42 [place], Izora [name] ([id number]) -------------------------------------------------------------------------------- Vitals Details Patient Name: [name], [name] Date of Service: 06/09/2019 9:00 AM Medical Record Number: [id number] Patient Account Number: [id number] Date of Birth/Sex: 10-16-1960 (57 y.o. M) Treating RN: Harold Barban Primary Care Dymir Neeson:  [name] Other Clinician: Referring Iretha Kirley: [name] Treating Spyros Winch/Extender: STONE III, HOYT Weeks in Treatment: 19 Vital Signs Time Taken: 09:06 Temperature (F): 98.6 Height (in): 67 Pulse (bpm): 76 Weight (lbs): 220 Respiratory Rate (breaths/min): 18 Body Mass Index (BMI): 34.5 Blood Pressure (mmHg): 98/50 Reference Range: 80 - 120 mg / dl Electronic Signature(s) Signed: 06/09/2019 4:07:54 PM By:  Harold Barban Entered By: Harold Barban on 06/09/2019 09:07:01

## 2019-06-23 ENCOUNTER — Encounter: Payer: Medicare Other | Attending: Physician Assistant | Admitting: Physician Assistant

## 2019-06-23 ENCOUNTER — Other Ambulatory Visit: Payer: Self-pay

## 2019-06-23 DIAGNOSIS — E1151 Type 2 diabetes mellitus with diabetic peripheral angiopathy without gangrene: Secondary | ICD-10-CM | POA: Diagnosis not present

## 2019-06-23 DIAGNOSIS — I132 Hypertensive heart and chronic kidney disease with heart failure and with stage 5 chronic kidney disease, or end stage renal disease: Secondary | ICD-10-CM | POA: Insufficient documentation

## 2019-06-23 DIAGNOSIS — N186 End stage renal disease: Secondary | ICD-10-CM | POA: Insufficient documentation

## 2019-06-23 DIAGNOSIS — Z8673 Personal history of transient ischemic attack (TIA), and cerebral infarction without residual deficits: Secondary | ICD-10-CM | POA: Insufficient documentation

## 2019-06-23 DIAGNOSIS — I5042 Chronic combined systolic (congestive) and diastolic (congestive) heart failure: Secondary | ICD-10-CM | POA: Diagnosis not present

## 2019-06-23 DIAGNOSIS — I480 Paroxysmal atrial fibrillation: Secondary | ICD-10-CM | POA: Diagnosis not present

## 2019-06-23 DIAGNOSIS — L97822 Non-pressure chronic ulcer of other part of left lower leg with fat layer exposed: Secondary | ICD-10-CM | POA: Insufficient documentation

## 2019-06-23 DIAGNOSIS — Z89611 Acquired absence of right leg above knee: Secondary | ICD-10-CM | POA: Diagnosis not present

## 2019-06-23 DIAGNOSIS — Z992 Dependence on renal dialysis: Secondary | ICD-10-CM | POA: Insufficient documentation

## 2019-06-23 DIAGNOSIS — Z89512 Acquired absence of left leg below knee: Secondary | ICD-10-CM | POA: Insufficient documentation

## 2019-06-23 DIAGNOSIS — E1122 Type 2 diabetes mellitus with diabetic chronic kidney disease: Secondary | ICD-10-CM | POA: Diagnosis not present

## 2019-06-23 DIAGNOSIS — Z87891 Personal history of nicotine dependence: Secondary | ICD-10-CM | POA: Diagnosis not present

## 2019-06-23 DIAGNOSIS — E11622 Type 2 diabetes mellitus with other skin ulcer: Secondary | ICD-10-CM | POA: Insufficient documentation

## 2019-06-23 DIAGNOSIS — L97812 Non-pressure chronic ulcer of other part of right lower leg with fat layer exposed: Secondary | ICD-10-CM | POA: Diagnosis not present

## 2019-06-23 NOTE — Progress Notes (Signed)
NIDAL, MARCUS ([id number]) Visit Report for 06/23/2019 Arrival Information [name] Patient Name: [name], [name] Date of Service: 06/23/2019 8:15 AM Medical Record Number: [id number] Patient Account Number: [id number] Date of Birth/Sex: [birth year] (58 y.o. M) Treating RN: [name] Primary Care Airlie Blumenberg: [name] Other Clinician: Referring Arnesia Vincelette: [name] Treating Keeon Zurn/Extender: [name], HOYT Weeks in Treatment: 21 Visit Information History Since Last Visit Added or deleted any medications: No Patient Arrived: Wheel Chair Any new allergies or adverse reactions: No Arrival Time: 08:21 Had a fall or experienced change in No Accompanied By: wife activities of daily living that may affect Transfer Assistance: None risk of falls: Patient Identification Verified: Yes Signs or symptoms of abuse/neglect since last visito No Secondary Verification Process Completed: Yes Hospitalized since last visit: No Patient Has Alerts: Yes Implantable device outside of the clinic excluding No Patient Alerts: Type II Diabetic cellular tissue based products placed in the center Aspirin 81mg  since last visit: Has Dressing in Place as Prescribed: Yes Pain Present Now: No Electronic Signature(s) Signed: 06/23/2019 4:14:48 PM By: Lorine Bears RCP, RRT, CHT Entered By: Lorine Bears on 06/23/2019 08:22:25 [name] ([id number]) -------------------------------------------------------------------------------- Encounter Discharge Information [name] Patient Name: [name], [name] Date of Service: 06/23/2019 8:15 AM Medical Record Number: [id number] Patient Account Number: [id number] Date of Birth/Sex: [birth year] (58 y.o. M) Treating RN: [name] Primary Care Shalisa Mcquade: [name] Other Clinician: Referring Nyoka Alcoser: [name] Treating Sidney Silberman/Extender: [name], HOYT Weeks in Treatment: 21 Encounter Discharge Information Items Post  Procedure Vitals Discharge Condition: Stable Temperature (F): 98.4 Ambulatory Status: Wheelchair Pulse (bpm): 74 Discharge Destination: Home Respiratory Rate (breaths/min): 18 Transportation: Private Auto Blood Pressure (mmHg): 118/57 Accompanied By: spouse Schedule Follow-up Appointment: Yes Clinical Summary of Care: Electronic Signature(s) Signed: 06/23/2019 4:33:06 PM By: [name] Entered By: [name] on 06/23/2019 09:06:07 [name] ([id number]) -------------------------------------------------------------------------------- Lower Extremity Assessment [name] Patient Name: [name], [name] Date of Service: 06/23/2019 8:15 AM Medical Record Number: [id number] Patient Account Number: [id number] Date of Birth/Sex: [birth year] (58 y.o. M) Treating RN: Army Melia Primary Care Avanell Banwart: [name] Other Clinician: Referring Royce Sciara: [name] Treating Reilley Valentine/Extender: [name], HOYT Weeks in Treatment: 21 Electronic Signature(s) Signed: 06/23/2019 11:37:35 AM By: Army Melia Entered By: Army Melia on 06/23/2019 08:32:34 [name] ([id number]) -------------------------------------------------------------------------------- Multi Wound Chart [name] Patient Name: [name], [name] Date of Service: 06/23/2019 8:15 AM Medical Record Number: [id number] Patient Account Number: [id number] Date of Birth/Sex: [birth year] (58 y.o. M) Treating RN: [name] Primary Care Yan Okray: [name] Other Clinician: Referring Saidy Ormand: [name] Treating Chasady Longwell/Extender: STONE III, HOYT Weeks in Treatment: 21 Vital Signs Height(in): 35 Pulse(bpm): 62 Weight(lbs): 220 Blood Pressure(mmHg): 118/57 Body Mass Index(BMI): 34 Temperature(F): 98.4 Respiratory Rate 18 (breaths/min): Photos: [N/A:N/A] Wound Location: Right Amputation Site - Above Left Amputation Site - Below N/A Knee Knee Wounding Event: Surgical Injury Pressure Injury N/A Primary  Etiology: Arterial Insufficiency Ulcer Pressure Ulcer N/A Secondary Etiology: 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 Comorbid History: Arrhythmia, Congestive Heart Arrhythmia, Congestive Heart N/A Failure, Hypertension, Failure, Hypertension, Peripheral Arterial Disease, Peripheral Arterial Disease, Type II Diabetes, End Stage Type II Diabetes, End Stage Renal Disease Renal Disease Date Acquired: 09/09/2018 04/25/2019 N/A Weeks of Treatment: 21 4 N/A Wound Status: Open Open N/A Measurements L x W x D 0.5x0.8x0.1 0.5x1.8x0.2 N/A (cm) Area (cm) : 0.314 0.707 N/A Volume (cm) : 0.031 0.141 N/A % Reduction in Area: 97.90% -50.10% N/A % Reduction in  Volume: 99.70% 0.00% N/A Classification: Full Thickness Without Category/Stage III N/A Exposed Support Structures Exudate Amount: None Present Medium N/A Exudate Type: N/A Serous N/A Exudate Color: N/A amber N/A Wound Margin: Thickened Flat and Intact N/A Granulation Amount: None Present (0%) Small (1-33%) N/A Granulation Quality: N/A Pink N/A Necrotic Amount: Large (67-100%) Large (67-100%) N/A ANZIO, OREJEL. ([id number]) Necrotic Tissue: Eschar, Adherent [facility] N/A Exposed Structures: Fat Layer (Subcutaneous Fat Layer (Subcutaneous N/A Tissue) Exposed: Yes Tissue) Exposed: Yes Fascia: No Fascia: No Tendon: No Tendon: No Muscle: No Muscle: No Joint: No Joint: No Bone: No Bone: No Epithelialization: Small (1-33%) None N/A Treatment Notes Electronic Signature(s) Signed: 06/23/2019 4:33:06 PM By: [name] Entered By: [name] on 06/23/2019 08:58:59 [name] ([id number]) -------------------------------------------------------------------------------- [name] Patient Name: [name], [name] Date of Service: 06/23/2019 8:15 AM Medical Record Number: [id number] Patient Account Number: [id number] Date of Birth/Sex: 06-09-1961  (58 y.o. M) Treating RN: [name] Primary Care Gearold Wainer: [name] Other Clinician: Referring Isayah Ignasiak: [name] Treating Khamron Gellert/Extender: [name], HOYT Weeks in Treatment: 21 Active Inactive Abuse / Safety / Falls / Self Care Management Nursing Diagnoses: Impaired physical mobility Goals: Patient will not develop complications from immobility Date Initiated: 01/27/2019 Target Resolution Date: 04/25/2019 Goal Status: Active Interventions: Assess fall risk on admission and as needed Notes: Orientation to the Wound Care Program Nursing Diagnoses: Knowledge deficit related to the wound healing center program Goals: Patient/caregiver will verbalize understanding of the [facility] Date Initiated: 01/27/2019 Target Resolution Date: 04/25/2019 Goal Status: Active Interventions: Provide education on orientation to the wound center Notes: Pain, Acute or Chronic Nursing Diagnoses: Pain, acute or chronic: actual or potential Goals: Patient will verbalize adequate pain control and receive pain control interventions during procedures as needed Date Initiated: 01/27/2019 Target Resolution Date: 04/25/2019 Goal Status: Active Interventions: Complete pain assessment as per visit requirements SIDARTH, GOLON. ([id number]) Notes: Wound/Skin Impairment Nursing Diagnoses: Impaired tissue integrity Goals: Ulcer/skin breakdown will heal within 14 weeks Date Initiated: 01/27/2019 Target Resolution Date: 04/25/2019 Goal Status: Active Interventions: Assess patient/caregiver ability to obtain necessary supplies Assess patient/caregiver ability to perform ulcer/skin care regimen upon admission and as needed Assess ulceration(s) every visit Notes: Electronic Signature(s) Signed: 06/23/2019 4:33:06 PM By: [name] Entered By: [name] on 06/23/2019 08:58:48 [place], [name]  ([id number]) -------------------------------------------------------------------------------- Pain Assessment [name] Patient Name: [name], [name] Date of Service: 06/23/2019 8:15 AM Medical Record Number: [id number] Patient Account Number: [id number] Date of Birth/Sex: 04/16/1961 (58 y.o. M) Treating RN: [name] Primary Care Alie Moudy: [name] Other Clinician: Referring Naiyana Barbian: [name] Treating Aarib Pulido/Extender: [name], HOYT Weeks in Treatment: 21 Active Problems Location of Pain Severity and Description of Pain Patient Has Paino No Site Locations Pain Management and Medication Current Pain Management: Electronic Signature(s) Signed: 06/23/2019 4:14:48 PM By: Lorine Bears RCP, RRT, CHT Signed: 06/23/2019 4:33:06 PM By: [name] Entered By: Lorine Bears on 06/23/2019 08:22:32 [name] ([id number]) -------------------------------------------------------------------------------- Patient/Caregiver Education [name] Patient Name: [name], [name] Date of Service: 06/23/2019 8:15 AM Medical Record Number: [id number] Patient Account Number: [id number] Date of Birth/Gender: [birth year] (58 y.o. M) Treating RN: [name] Primary Care Physician: [name] Other Clinician: Referring Physician: [name] Treating Physician/Extender: Sharalyn Ink in Treatment: 21 Education Assessment Education Provided To: Patient and Caregiver Education Topics Provided Wound/Skin Impairment: Handouts: Other: wound care as ordered Methods: Demonstration, Explain/Verbal Responses: State content correctly Electronic Signature(s) Signed: 06/23/2019 4:33:06 PM By: Marjory Lies,  Di Kindle Entered By: [name] on 06/23/2019 09:05:03 [name] ([id number]) -------------------------------------------------------------------------------- Wound Assessment [name] Patient Name: Brackens, [name] Date of Service: 06/23/2019 8:15  AM Medical Record Number: [id number] Patient Account Number: [id number] Date of Birth/Sex: 1961-01-26 (58 y.o. 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 Event: Surgical Injury Etiology: Date Acquired: 09/09/2018 Wound Open Weeks Of Treatment: 21 Status: Clustered Wound: No Comorbid Arrhythmia, Congestive Heart Failure, History: Hypertension, Peripheral Arterial Disease, Type II Diabetes, End Stage Renal Disease Photos Wound Measurements Length: (cm) 0.5 Width: (cm) 0.8 Depth: (cm) 0.1 Area: (cm) 0.314 Volume: (cm) 0.031 % Reduction in Area: 97.9% % Reduction in Volume: 99.7% Epithelialization: Small (1-33%) Tunneling: No Undermining: No Wound Description Full Thickness Without Exposed Support Foul Od Classification: Structures Slough/ Wound Margin: Thickened Exudate None Present Amount: or After Cleansing: No Fibrino Yes Wound Bed Granulation Amount: None Present (0%) Exposed Structure Necrotic Amount: Large (67-100%) Fascia Exposed: No Necrotic Quality: Eschar, Adherent Slough Fat Layer (Subcutaneous Tissue) Exposed: Yes Tendon Exposed: No Muscle Exposed: No Joint Exposed: No Bone Exposed: No Spoon, [name] ([id number]) Treatment Notes Wound #1 (Right Amputation Site - Above Knee) Notes R - vaseline gauze and BFD; L - santyl and BFD Electronic Signature(s) Signed: 06/23/2019 11:37:35 AM By: Army Melia Entered By: Army Melia on 06/23/2019 08:30:36 [name] ([id number]) -------------------------------------------------------------------------------- Wound Assessment [name] Patient Name: [name], [name] Date of  Service: 06/23/2019 8:15 AM Medical Record Number: [id number] Patient Account Number: [id number] Date of Birth/Sex: [birth year] (58 y.o. 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 Wounding Event: Pressure Injury Etiology: Date Acquired: 04/25/2019 Wound Open Weeks Of Treatment: 4 Status: Clustered Wound: No Comorbid Arrhythmia, Congestive Heart Failure, History: Hypertension, Peripheral Arterial Disease, Type II Diabetes, End Stage Renal Disease Photos Wound Measurements Length: (cm) 0.5 Width: (cm) 1.8 Depth: (cm) 0.2 Area: (cm) 0.707 Volume: (cm) 0.141 % Reduction in Area: -50.1% % Reduction in Volume: 0% Epithelialization: None Tunneling: No Undermining: No Wound Description Classification: Category/Stage III Foul Odo Wound Margin: Flat and Intact Slough/F Exudate Amount: Medium Exudate Type: Serous Exudate Color: amber r After Cleansing: No ibrino Yes Wound Bed Granulation Amount: Small (1-33%) Exposed Structure Granulation Quality: Pink Fascia Exposed: No Necrotic Amount: Large (67-100%) Fat Layer (Subcutaneous Tissue) Exposed: Yes Necrotic Quality: Adherent Slough Tendon Exposed: No Muscle Exposed: No Joint Exposed: No Bone Exposed: No Richer, [name] ([id number]) Treatment Notes Wound #2 (Left Amputation Site - Below Knee) Notes R - vaseline gauze and BFD; L - santyl and BFD Electronic Signature(s) Signed: 06/23/2019 11:37:35 AM By: Army Melia Entered By: Army Melia on 06/23/2019 08:32:12 [name] ([id number]) -------------------------------------------------------------------------------- Vitals [name] Patient Name: [name], [name] Date  of Service: 06/23/2019 8:15 AM Medical Record Number: [id number] Patient Account Number: [id number] Date of Birth/Sex: 1961/04/30 (58 y.o. M) Treating RN: [name] Primary Care Doug Bucklin: [name] Other Clinician: Referring Elisah Parmer: [name] Treating Von Quintanar/Extender: STONE III, HOYT Weeks in Treatment: 21 Vital Signs Time Taken: 08:22 Temperature (F): 98.4 Height (in): 67 Pulse (  bpm): 74 Weight (lbs): 220 Respiratory Rate (breaths/min): 18 Body Mass Index (BMI): 34.5 Blood Pressure (mmHg): 118/57 Reference Range: 80 - 120 mg / dl Electronic Signature(s) Signed: 06/23/2019 4:14:48 PM By: Lorine Bears RCP, RRT, CHT Entered By: Lorine Bears on 06/23/2019 08:25:10

## 2019-06-23 NOTE — Progress Notes (Addendum)
ADMIRAL, WIESER ([id number]) Visit Report for 06/23/2019 Chief Complaint Document Details Patient Name: [name], [name] Date of Service: 06/23/2019 8:15 AM Medical Record Number: [id number] Patient Account Number: [id number] Date of Birth/Sex: Nov 24, 1960 (58 y.o. 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 Date of Service: 06/23/2019 8:15 AM Medical Record Number: [id number] Patient Account Number: [id number] Date of Birth/Sex: 26-Nov-1960 (58 y.o. M) Treating RN: [name] Primary Care Provider: [name] Other Clinician: Referring Provider: [name] Treating Provider/Extender: [name], Eliannah Hinde Weeks in Treatment: 21 Debridement Performed for Wound #2 Left Amputation Site - Below Knee Assessment: Performed By: Physician [name], Kristopher Millar E., PA-C Debridement Type: Debridement Severity of Tissue Pre Fat layer exposed Debridement: Level of Consciousness (Pre- Awake and Alert procedure): Pre-procedure Verification/Time Yes - 09:01 Out Taken: Start Time: 09:01 Pain Control: Lidocaine 4% Topical Solution Total Area Debrided (L x W): 0.5 (cm) x 1.8 (cm) = 0.9 (cm) Tissue and other material Viable, Non-Viable, Slough, Subcutaneous, Slough debrided: Level: Skin/Subcutaneous Tissue Debridement Description: Excisional Instrument: Curette Bleeding: Minimum Hemostasis Achieved: Pressure End Time: 09:03 Procedural Pain: 0 Post Procedural Pain: 0 Response to  Treatment: Procedure was tolerated well Level of Consciousness Awake and Alert (Post-procedure): Post Debridement Measurements of Total Wound Length: (cm) 0.5 Stage: Category/Stage [name] Width: (cm) 1.8 Depth: (cm) 0.3 Volume: (cm) 0.212 Character of Wound/Ulcer Post Improved Debridement: Severity of Tissue Post Debridement: Fat layer exposed Post Procedure Diagnosis Same as Pre-procedure Electronic Signature(s) Signed: 06/23/2019 4:33:06 PM By: [name] Signed: 06/23/2019 5:39:49 PM By: Worthy Keeler PA-C Entered By: [name] on 06/23/2019 09:03:43 [name], [name] ([id number]) -------------------------------------------------------------------------------- HPI Details Patient Name: [name], [name] Date of Service: 06/23/2019 8:15 AM Medical Record Number: [id number] Patient Account Number: [id number] Date of Birth/Sex: 25-Jan-1961 (58 y.o. 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 He has [name] history of hypertension, diabetes, congestive heart failure, and states that he is having [name] lot of discomfort at the wound site unfortunately. There does not appear to be any signs of active infection upon initial inspection today. No fevers, chills, nausea, or vomiting noted at this time.. Silvadene Cream has been utilized at some point on this wound as  well. Patient did have home health coming out but unfortunately the patient and his wife state that they were not happy with the services being rendered and therefore discontinued the service. There does not appear to be any signs of otherwise active infection at this time which is good news. The patient states he's definitely ready for this to heal and has been [name] symptomatic  time since his surgery and he is becoming somewhat frustrated. Iodoflex also sounds to have previously been used on the wound that is when the patient's wife states the wound seem to get worse not better. As best I can tell [name] has not been utilized. 02/03/19 on evaluation today patient appears to be doing much better compared to last week's evaluation in regard to his ulcer on the right amputation site. He's been tolerating the dressing changes without complication which is good news. The Santyl does seem to be of benefit in fact the wound appears to be doing significantly better compared to my prior evaluation. Overall very pleased in this regard. No fevers, chills, nausea, or vomiting noted at this time. 02/10/19 on evaluation today patient's wound actually appears to be showing signs of excellent improvement at this time which is great news. He has been tolerating the dressing changes without complication overall I feel like that there is no signs of active infection at this point which is also good news. I feel like that the [name] is very beneficial. Patient's wife likewise feels like things are doing quite well and is not having pain like he was in the past. 02/17/19 on evaluation today patient appears to be doing well in regard to the wound on the distal aspect of his right amputation site. He's been tolerating treatment with the Santyl at this point. I think he is nearing completion as far as treatment with cental is concerned I think were very close to getting this significantly better. Fortunately there's no evidence  of infection currently. 03/03/19 on evaluation today patient actually appears to be doing quite well in regard to his his right imputation site ulceration. In fact there's [name] lot of new skin growth which is excellent news. I'm very pleased in this regard. Fortunately there's no signs of active infection also good news. No fevers, chills, nausea, or vomiting noted at this time. 03/17/19 on evaluation today patient's wound actually appears to be showing signs of improvement which is good news. He does have [name] lot of issues however with concerns about how slowly this seems to be healing as far as he and his wife are concerned. I do believe some of the issues that were having currently are from the language barrier perspective but even that we have an interpreter that has been present for each evaluation I still think that there is some issues with their overall understanding of the treatment plan and how long wounds of this nature do take to heal. We began seeing him on April 14 and in just over [name] month and [name] half he has made improvement to where the wound is one third the size originally began. Nonetheless there still quite [name] bit of feeling left to do but again he has made great progress up to this point. Unfortunately he did have some issues with the silver collagen that was ordered he received something different from the [facility]. This was Colactive. Unfortunately this actually calls some burning he seemed to have some irritation from the dressing itself that caused irritation and redness around the edge of the wound that seems to be improving but nonetheless he and his wife both believe that the Santyl might've done better. They also really want to try to utilize something that will prevent me having to degree the wound as he typically has discomfort with the debridement obviously. 03/31/19 on evaluation today patient actually appears to be doing better in regard to his lower extremity ulcer.  He's  been tolerating the dressing changes without complication. Fortunately there's no signs of active infection at this time. He has [name] lot of new skin growth in the Santyl does seem to be working appropriately for him at this time. I'm very pleased in this regard. [name], [name] ([id number]) 04/14/19 on evaluation today patient appears to be doing quite well in regard to his invitation site ulcer. This is [name] little smaller even than half the size of where it was when I saw him last. Fortunately there is no sign of active infection at this time which is good news. Overall he has been tolerating the dressing changes without complication still states that he has [name] lot of pain but again I really cannot see anything with regard to the wound that would be causing this and maybe more for neuropathy type pain. He states his weeks and up at night sometimes. 04/28/19-Patient presents at 2 weeks, with the right AKA wound, we have been using Santyl for this and there was mention of changing it to [name] different dressing this time around. 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 He's been tolerating the dressing changes with the Santyl unfortunately when we switched to the collagen dressing again last time he was seen in the office when I wasn't here his wound bed deteriorated as well in the following days. His wife came in to discuss this with me and subsequently we opted to go back to the [place] which seems to be doing excellent for him at this point. 05/26/2019 upon evaluation today patient actually appears to be doing excellent in regard to his wound on the right amputation site above the knee. In fact this appears to be almost completely healed which is great news. He has been utilizing Santyl. However he does have an issue on his left below-knee amputation stump where he has [name]  wound as well he did not want to show me his wife wanted him to. In the end he decided he would and we did evaluate that today as well. It is not [name] very deep wound but nonetheless is something that I think [name] to be addressed sooner rather than later to prevent any additional issues at this point. 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 There may still be just [name] slight area where there some drainage but I really feel like most of the moisture is likely coming from the [place]. With that being said his left BKA ulcer is not doing quite as well there still [name] lot of Slough noted at this time and again there is some Epiboly as well this would require sharp debridement today. 06/23/2019 on evaluation today patient appears to be doing well with regard to his right ulcer on the distal stump. This actually is almost completely healed although there is [name] very tiny area still open. Subsequently he is also doing better with regard to the left distal stump location which he has been trying to be very careful about as far as not using his prosthesis anymore than he absolutely has to. I believe this shows and he has done [name] great job in this regard. Electronic Signature(s) Signed: 06/23/2019 5:00:16 PM By: Worthy Keeler PA-C Entered By: Worthy Keeler on 06/23/2019 17:00:16 [name] ([id number]) -------------------------------------------------------------------------------- Physical Exam Details  Patient Name: [name], [name] Date of Service: 06/23/2019 8:15 AM Medical Record Number: [id number] Patient Account Number: [id number] Date of Birth/Sex: 1961-04-07 (58 y.o. M) Treating RN: [name] Primary Care Provider: [name] Other Clinician: Referring Provider: [name] Treating Provider/Extender: [name], Kristopher Adebayo Weeks in Treatment: 21 Constitutional Well-nourished and well-hydrated in no acute  distress. Respiratory normal breathing without difficulty. Psychiatric this patient is able to make decisions and demonstrates good insight into disease process. Alert and Oriented x 3. pleasant and cooperative. Notes Upon inspection today patient's wound bed actually showed signs of excellent granulation there did not appear to be any signs of active infection which is good news. Overall I am very pleased with the way things are appearing. I did have to perform some sharp debridement in regard to the left distal wound as again there was some minimal slough noted I do believe the [name] is doing [name] great job however. Post debridement the wound bed appears to be doing much better. Electronic Signature(s) Signed: 06/23/2019 5:01:05 PM By: Worthy Keeler PA-C Entered By: Worthy Keeler on 06/23/2019 17:01:05 [name] ([id number]) -------------------------------------------------------------------------------- Physician Orders Details Patient Name: Kristopher Haggard L. Date of Service: 06/23/2019 8:15 AM Medical Record Number: [id number] Patient Account Number: [id number] Date of Birth/Sex: 08/08/1961 (58 y.o. M) Treating RN: [name] Primary Care Provider: [name] Other Clinician: Referring Provider: [name] Treating Provider/Extender: [name], Deadrian Toya Weeks in Treatment: 21 Verbal / Phone Orders: No Diagnosis Coding ICD-10 Coding Code Description [id number] Non-pressure chronic ulcer of other part of right lower leg with fat layer exposed Z89.611 Acquired absence of right leg above knee Z89.512 Acquired absence of left leg below knee L97.822 Non-pressure chronic ulcer of other part of left lower leg with fat layer exposed I73.89 Other specified peripheral vascular diseases 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 Wound #2 Left Amputation Site - Below Knee o Clean wound with Normal Saline. Anesthetic (add to Medication List) Wound #1 Right Amputation Site - Above Knee o Topical Lidocaine 4% cream applied to wound bed prior to debridement (In Clinic Only). Wound #2 Left Amputation Site - Below Knee o Topical Lidocaine 4% cream applied to wound bed prior to debridement (In Clinic Only). Primary Wound Dressing Wound #1 Right Amputation Site - Above Knee o petroleum gauze Wound #2 Left Amputation Site - Below Knee o Santyl Ointment Secondary Dressing Wound #1 Right Amputation Site - Above Knee o ABD pad o Boardered Foam Dressing Bigler, Avraj L. ([id number]) Wound #2 Left Amputation Site - Below Knee o ABD pad o Boardered Foam Dressing Dressing Change Frequency Wound #1 Right Amputation Site - Above Knee o Change dressing every other day. Wound #2 Left Amputation Site - Below Knee o Change dressing every day. Follow-up Appointments o Return Appointment in 2 weeks. Electronic Signature(s) Signed: 06/23/2019 4:33:06 PM By: [name] Signed: 06/23/2019 5:39:49 PM By: Worthy Keeler PA-C Entered By: [name] on 06/23/2019 09:04:40 [name] ([id number]) -------------------------------------------------------------------------------- Problem List Details Patient Name: [name], [name] Date of Service: 06/23/2019 8:15 AM Medical Record Number: [id number] Patient Account Number: [id number] Date of Birth/Sex: August 06, 1961 (58 y.o. M) Treating RN: [name] Primary Care Provider: [name] Other Clinician: Referring  Provider: [name] Treating Provider/Extender: [name], Kush Farabee Weeks in Treatment: 21 Active Problems ICD-10 Evaluated Encounter Code Description Active  Date Today Diagnosis L97.812 Non-pressure chronic ulcer of other part of right lower leg 01/27/2019 No Yes with fat layer exposed Z89.611 Acquired absence of right leg above knee 01/27/2019 No Yes Z89.512 Acquired absence of left leg below knee 05/26/2019 No Yes L97.822 Non-pressure chronic ulcer of other part of left lower leg with 05/26/2019 No Yes fat layer exposed I73.89 Other specified peripheral vascular diseases 01/27/2019 No Yes E11.622 Type 2 diabetes mellitus with other skin ulcer 01/27/2019 No Yes I50.42 Chronic combined systolic (congestive) and diastolic [id number] No Yes (congestive) heart failure N18.6 End stage renal disease 01/27/2019 No Yes Z99.2 Dependence on renal dialysis 01/27/2019 No Yes I10 Essential (primary) hypertension 01/27/2019 No Yes Lorton, Dontavion L. ([id number]) I48.0 Paroxysmal atrial fibrillation 01/27/2019 No Yes Inactive Problems Resolved Problems Electronic Signature(s) Signed: 06/23/2019 8:55:41 AM By: Worthy Keeler PA-C Entered By: Worthy Keeler on 06/23/2019 08:55:40 [name] ([id number]) -------------------------------------------------------------------------------- Progress Note Details Patient Name: [name], Tayo L. Date of Service: 06/23/2019 8:15 AM Medical Record Number: [id number] Patient Account Number: [id number] Date of Birth/Sex: [birth year] (58 y.o. 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 He has [name] left below knee imputation which has also  previously been performed in 2015 he wears [name] prosthesis on the left lower extremity. 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 He has [name] history of hypertension, diabetes, congestive heart failure, and states that he is having [name] lot of discomfort at the wound site unfortunately. There does not appear to be any signs of active infection upon initial inspection today. No fevers, chills, nausea, or vomiting noted at this time.. Silvadene Cream has been utilized at some point on this wound as well. Patient did have home health coming out but unfortunately the patient and his wife state that they were not happy with the services being rendered and therefore discontinued the service. There does not appear to be any signs of otherwise active infection at this time which is good news. The patient states he's definitely ready for this to heal and has been [name] symptomatic time since his surgery and he is becoming somewhat frustrated. Iodoflex also sounds to have previously been used on the wound that is when the patient's wife states the wound seem to get worse not better. As best I can tell [name] has not been utilized. 02/03/19 on evaluation today patient appears to be doing much better compared to last week's evaluation in regard to his ulcer on the right amputation site. He's been tolerating the dressing changes without complication which is good news. The Santyl does seem to be of benefit in fact the wound appears to be doing significantly better compared to my prior evaluation. Overall very pleased in this regard. No fevers, chills, nausea, or vomiting noted at this time. 02/10/19 on evaluation today patient's wound actually appears to be showing signs of excellent improvement at this time which is great news. He has  been tolerating the dressing changes without complication overall I feel like that there is no signs of active infection at this point  which is also good news. I feel like that the [name] is very beneficial. Patient's wife likewise feels like things are doing quite well and is not having pain like he was in the past. 02/17/19 on evaluation today patient appears to be doing well in regard to the wound on the distal aspect of his right amputation site. He's been tolerating treatment with the Santyl at this point. I think he is nearing completion as far as treatment with cental is concerned I think were very close to getting this significantly better. Fortunately there's no evidence of infection currently. 03/03/19 on evaluation today patient actually appears to be doing quite well in regard to his his right imputation site ulceration. In fact there's [name] lot of new skin growth which is excellent news. I'm very pleased in this regard. Fortunately there's no signs of active infection also good news. No fevers, chills, nausea, or vomiting noted at this time. 03/17/19 on evaluation today patient's wound actually appears to be showing signs of improvement which is good news. He does have [name] lot of issues however with concerns about how slowly this seems to be healing as far as he and his wife are concerned. I do believe some of the issues that were having currently are from the language barrier perspective but even that we have an interpreter that has been present for each evaluation I still think that there is some issues with their overall understanding of the treatment plan and how long wounds of this nature do take to heal. We began seeing him on April 14 and in just over [name] month and [name] half he has made improvement to where the wound is one third the size originally began. Nonetheless there still quite [name] bit of feeling left to do but again he has made great progress up to this point. Unfortunately he did have some issues with the silver collagen that was ordered he received something different from the [facility]. This was Colactive. Unfortunately  this actually calls some burning he seemed to have some irritation from the dressing itself that caused irritation and redness around the edge of the wound that seems to be improving but nonetheless he and his wife both [name], [name] ([id number]) believe that the Santyl might've done better. They also really want to try to utilize something that will prevent me having to degree the wound as he typically has discomfort with the debridement obviously. 03/31/19 on evaluation today patient actually appears to be doing better in regard to his lower extremity ulcer. He's been tolerating the dressing changes without complication. Fortunately there's no signs of active infection at this time. He has [name] lot of new skin growth in the Santyl does seem to be working appropriately for him at this time. I'm very pleased in this regard. 04/14/19 on evaluation today patient appears to be doing quite well in regard to his invitation site ulcer. This is [name] little smaller even than half the size of where it was when I saw him last. Fortunately there is no sign of active infection at this time which is good news. Overall he has been tolerating the dressing changes without complication still states that he has [name] lot of pain but again I really cannot see anything with regard to the wound that would be causing this and maybe more for neuropathy type pain. He  states his weeks and up at night sometimes. 04/28/19-Patient presents at 2 weeks, with the right AKA wound, we have been using Santyl for this and there was mention of changing it to [name] different dressing this time around. 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 He's been tolerating the dressing changes with the Santyl unfortunately when we switched to the collagen dressing again last time he was seen in the office when I wasn't here  his wound bed deteriorated as well in the following days. His wife came in to discuss this with me and subsequently we opted to go back to the [place] which seems to be doing excellent for him at this point. 05/26/2019 upon evaluation today patient actually appears to be doing excellent in regard to his wound on the right amputation site above the knee. In fact this appears to be almost completely healed which is great news. He has been utilizing Santyl. However he does have an issue on his left below-knee amputation stump where he has [name] wound as well he did not want to show me his wife wanted him to. In the end he decided he would and we did evaluate that today as well. It is not [name] very deep wound but nonetheless is something that I think [name] to be addressed sooner rather than later to prevent any additional issues at this point. 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 There may still be just [name] slight area where there some drainage but I really feel like most of the moisture is likely coming from the [place]. With that being said his left BKA ulcer is not doing quite as well there still [name] lot of Slough noted at this time and again there is some Epiboly as well this would require sharp debridement today. 06/23/2019 on evaluation today patient appears to be doing well with regard to his right ulcer on the distal stump. This actually is almost completely healed although there is [name] very tiny area still open. Subsequently he is also doing better with regard to the left distal stump location which he has been trying to be very careful about as far as not using his prosthesis anymore than he absolutely has to. I believe this shows and he has done [name] great job in this regard. Patient History Information obtained from Patient. Family History Cancer - Father, Diabetes - Siblings, No family history of Hereditary  Spherocytosis. Social History Former smoker - ended on 10/16/1994, Marital Status - Married, Alcohol Use - Never, Drug Use - No History, Caffeine Use - Rarely. Medical History Cardiovascular Patient has history of Arrhythmia - [name]-fib, Congestive Heart Failure, Hypertension, Peripheral Arterial Disease - Bilateral LE amputations Endocrine Patient has history of Type II Diabetes Genitourinary Patient has history of End Stage Renal Disease - Dialysis M,W,F [name], [name] ([id number]) Hospitalization/Surgery History - Right AKA. - Left BKA. 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 Respiratory Denies complaints or symptoms of Chronic or frequent coughs, Shortness of Breath. Cardiovascular Denies complaints or symptoms of Chest pain, LE edema. Psychiatric Denies complaints or symptoms of Anxiety, Claustrophobia. Objective Constitutional Well-nourished  and well-hydrated in no acute distress. Vitals Time Taken: 8:22 AM, Height: 67 in, Weight: 220 lbs, BMI: 34.5, Temperature: 98.4 F, Pulse: 74 bpm, Respiratory Rate: 18 breaths/min, Blood Pressure: 118/57 mmHg. Respiratory normal breathing without difficulty. Psychiatric this patient is able to make decisions and demonstrates good insight into disease process. Alert and Oriented x 3. pleasant and cooperative. General Notes: Upon inspection today patient's wound bed actually showed signs of excellent granulation there did not appear to be any signs of active infection which is good news. Overall I am very pleased with the way things are appearing. I did have to perform some sharp debridement in regard to the left distal wound as again there was some minimal slough noted I do believe the  [name] is doing [name] great job however. Post debridement the wound bed appears to be doing much better. Integumentary (Hair, Skin) Wound #1 status is Open. Original cause of wound was Surgical Injury. The wound is located on the Right Amputation Site - Above Knee. The wound measures 0.5cm length x 0.8cm width x 0.1cm depth; 0.314cm^2 area and 0.031cm^3 volume. There is Fat Layer (Subcutaneous Tissue) Exposed exposed. There is no tunneling or undermining noted. There is [name] none present amount of drainage noted. The wound margin is thickened. There is no granulation within the wound bed. There is [name] large (67-100%) amount of necrotic tissue within the wound bed including Eschar and Adherent Slough. Wound #2 status is Open. Original cause of wound was Pressure Injury. The wound is located on the Left Amputation Site - Below Knee. The wound measures 0.5cm length x 1.8cm width x 0.2cm depth; 0.707cm^2 area and 0.141cm^3 volume. There is Fat Layer (Subcutaneous Tissue) Exposed exposed. There is no tunneling or undermining noted. There is [name] medium amount of serous drainage noted. The wound margin is flat and intact. There is small (1-33%) pink granulation within the wound bed. There is [name] large (67-100%) amount of necrotic tissue within the wound bed including Adherent Slough. 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 There was [name] Excisional Skin/Subcutaneous Tissue Debridement with [name] total area of 0.9 sq cm performed by [name], Verner Mccrone E., PA-C. With the following instrument(s): Curette to remove Viable and Non-Viable tissue/material. Material removed includes Subcutaneous Tissue and Slough and after achieving pain control using Lidocaine 4% Topical Solution. No specimens were taken. [name] time out was conducted at 09:01, prior to the start of the procedure. [name] Minimum amount of bleeding was controlled with Pressure. The procedure was tolerated well with [name] pain level of 0 throughout and [name] pain level of 0 following the procedure. Post Debridement Measurements: 0.5cm length x 1.8cm width x 0.3cm depth; 0.212cm^3 volume. Post debridement Stage noted as Category/Stage [name] Character of Wound/Ulcer Post Debridement is improved. Severity of Tissue Post Debridement is: Fat layer exposed. Post procedure Diagnosis Wound #2: Same as  Pre-Procedure Plan Wound Cleansing: Wound #1 Right Amputation Site - Above Knee: Clean wound with Normal Saline. Wound #2 Left Amputation Site - Below Knee: Clean wound with Normal Saline. Anesthetic (add to Medication List): Wound #1 Right Amputation Site - Above Knee: Topical Lidocaine 4% cream applied to wound bed prior to debridement (In Clinic Only). Wound #2 Left Amputation Site - Below Knee: Topical Lidocaine 4% cream applied to wound bed prior to debridement (In Clinic Only). Primary Wound Dressing: Wound #1 Right Amputation Site - Above Knee: petroleum gauze JOURDYN, SPAW. ([id number]) Wound #2 Left Amputation Site - Below Knee: Santyl Ointment Secondary Dressing: Wound #1 Right Amputation Site - Above Knee: ABD pad Boardered Foam Dressing Wound #2 Left Amputation Site - Below Knee: ABD pad Boardered Foam Dressing Dressing  Change Frequency: Wound #1 Right Amputation Site - Above Knee: Change dressing every other day. Wound #2 Left Amputation Site - Below Knee: Change dressing every day. Follow-up Appointments: Return Appointment in 2 weeks. 1. I would recommend at this time that we go ahead and continue with the [facility] for the left lower extremity ulcer for the time being. The patient is in agreement with that plan. 2. With regard to the right lower extremity ulcer I would recommend [name] petroleum gauze at this time I think this would be best to help keep this area from drying out and allow it to fully close at this point. 3. I do think the patient still [name] to avoid using the prosthesis on the left lower extremity at all times that he possibly can in order to prevent anything from worsening. We will see patient back for reevaluation in 2 weeks here in the clinic. If anything worsens or changes patient will contact our office for additional recommendations. Electronic Signature(s) Signed: 06/23/2019 5:02:41 PM By: Worthy Keeler PA-C Entered By: Worthy Keeler on 06/23/2019 17:02:41 [name] ([id number]) -------------------------------------------------------------------------------- ROS/PFSH Details Patient Name: [name], Marzell L. Date of Service: 06/23/2019 8:15 AM Medical Record Number: [id number] Patient Account Number: [id number] Date of Birth/Sex: January 14, 1961 (58 y.o. 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 Respiratory Complaints and Symptoms: Negative for: Chronic or  frequent coughs; Shortness of Breath Cardiovascular Complaints and Symptoms: Negative for: Chest pain; LE edema Medical History: Positive for: Arrhythmia - [name]-fib; Congestive Heart Failure; Hypertension; Peripheral Arterial Disease - Bilateral LE amputations Psychiatric Complaints and Symptoms: Negative for: Anxiety; Claustrophobia Endocrine Medical History: Positive for: Type II Diabetes Time with diabetes: 14 years Treated with: Oral agents Blood sugar tested every day: No Blood sugar testing results: Breakfast: 128; Lunch: 145 Genitourinary Medical History: Positive for: End Stage Renal Disease - Dialysis M,W,F Immunizations COLDEN, CZERNIAK. ([id number]) Pneumococcal Vaccine: Received Pneumococcal Vaccination: Yes Implantable Devices None Hospitalization / Surgery History Type of Hospitalization/Surgery Right AKA Left BKA Family and Social History Cancer: Yes - Father; Diabetes: Yes - Siblings; Hereditary Spherocytosis: No; Former smoker - ended on 10/16/1994; Marital Status - Married; Alcohol Use: Never; Drug Use: No History; Caffeine Use: Rarely Physician Affirmation I have reviewed and agree with the above information. Electronic Signature(s) Signed: 06/23/2019 5:39:49 PM By: Worthy Keeler PA-C Signed: 06/25/2019 4:59:38 PM By: [name]  Entered By: Worthy Keeler on 06/23/2019 17:00:32 [name] ([id number]) -------------------------------------------------------------------------------- SuperBill Details Patient Name: [name], Zaedyn L. Date of Service: 06/23/2019 Medical Record Number: [id number] Patient Account Number: [id number] Date of Birth/Sex: 20-May-1961 (58 y.o. M) Treating RN: [name] Primary Care Provider: [name] Other Clinician: Referring Provider: [name] Treating Provider/Extender: [name], Ezekeil Bethel Weeks in Treatment: 21 Diagnosis Coding ICD-10 Codes Code Description [id number] Non-pressure chronic ulcer of other part of  right lower leg with fat layer exposed Z89.611 Acquired absence of right leg above knee Z89.512 Acquired absence of left leg below knee L97.822 Non-pressure chronic ulcer of other part of left lower leg with fat layer exposed I73.89 Other specified peripheral vascular diseases E11.622 Type 2 diabetes mellitus with other skin ulcer I50.42 Chronic combined systolic (congestive) and diastolic (congestive) heart failure N18.6 End stage renal disease Z99.2 Dependence on renal dialysis I10 Essential (primary) hypertension I48.0 Paroxysmal atrial fibrillation Facility Procedures CPT4 Code Description: [id number] - DEB SUBQ TISSUE 20 SQ CM/< ICD-10 Diagnosis Description L97.822 Non-pressure chronic ulcer of other part of left lower leg wit Modifier: h fat layer expos Quantity: 1 ed Physician Procedures CPT4 Code Description: [id number] - WC PHYS SUBQ TISS 20 SQ CM ICD-10 Diagnosis Description L97.822 Non-pressure chronic ulcer of other part of left lower leg wit Modifier: h fat layer expos Quantity: 1 ed Electronic Signature(s) Signed: 06/23/2019 5:03:33 PM By: Worthy Keeler PA-C Entered By: Worthy Keeler on 06/23/2019 17:03:33

## 2019-07-01 ENCOUNTER — Other Ambulatory Visit (INDEPENDENT_AMBULATORY_CARE_PROVIDER_SITE_OTHER): Payer: Self-pay | Admitting: Nurse Practitioner

## 2019-07-01 DIAGNOSIS — T829XXS Unspecified complication of cardiac and vascular prosthetic device, implant and graft, sequela: Secondary | ICD-10-CM

## 2019-07-02 ENCOUNTER — Ambulatory Visit (INDEPENDENT_AMBULATORY_CARE_PROVIDER_SITE_OTHER): Payer: Medicare Other | Admitting: Nurse Practitioner

## 2019-07-02 ENCOUNTER — Ambulatory Visit (INDEPENDENT_AMBULATORY_CARE_PROVIDER_SITE_OTHER): Payer: Medicare Other

## 2019-07-02 ENCOUNTER — Telehealth (INDEPENDENT_AMBULATORY_CARE_PROVIDER_SITE_OTHER): Payer: Self-pay

## 2019-07-02 ENCOUNTER — Encounter (INDEPENDENT_AMBULATORY_CARE_PROVIDER_SITE_OTHER): Payer: Self-pay

## 2019-07-02 ENCOUNTER — Other Ambulatory Visit: Payer: Self-pay

## 2019-07-02 DIAGNOSIS — T829XXS Unspecified complication of cardiac and vascular prosthetic device, implant and graft, sequela: Secondary | ICD-10-CM | POA: Diagnosis not present

## 2019-07-02 DIAGNOSIS — N186 End stage renal disease: Secondary | ICD-10-CM | POA: Diagnosis not present

## 2019-07-02 DIAGNOSIS — Z992 Dependence on renal dialysis: Secondary | ICD-10-CM | POA: Diagnosis not present

## 2019-07-02 DIAGNOSIS — E1152 Type 2 diabetes mellitus with diabetic peripheral angiopathy with gangrene: Secondary | ICD-10-CM | POA: Diagnosis not present

## 2019-07-02 NOTE — Telephone Encounter (Signed)
Spoke with the patient's wife and gave her the paperwork regarding the patient's fistulagram with Dr. Lucky Cowboy on 07/16/2019 with a 7:45 am arrival time. Patient will do his Covid testing on 07/13/2019 between 12:30-2:30 pm. I asked when does he get out of dialysis on Monday and she stated he gets out at 11:30 am. Patient was given the pre-procedure instructions in Spanish before they left.

## 2019-07-07 ENCOUNTER — Encounter (INDEPENDENT_AMBULATORY_CARE_PROVIDER_SITE_OTHER): Payer: Self-pay | Admitting: Nurse Practitioner

## 2019-07-07 ENCOUNTER — Other Ambulatory Visit: Payer: Self-pay

## 2019-07-07 ENCOUNTER — Encounter: Payer: Medicare Other | Admitting: Physician Assistant

## 2019-07-07 DIAGNOSIS — E11622 Type 2 diabetes mellitus with other skin ulcer: Secondary | ICD-10-CM | POA: Diagnosis not present

## 2019-07-07 NOTE — Progress Notes (Addendum)
DE, KOPKA ([id number]) Visit Report for 07/07/2019 Arrival Information Details Patient Name: [name], [name] Date of Service: 07/07/2019 8:15 AM Medical Record Number: [id number] Patient Account Number: [id number] Date of Birth/Sex: 09-24-1961 (58 y.o. M) Treating RN: [name] Primary Care Liala Codispoti: [name] Other Clinician: Referring Adrinne Sze: [name] Treating Joyanne Eddinger/Extender: [name], HOYT Weeks in Treatment: 23 Visit Information History Since Last Visit Added or deleted any medications: No Patient Arrived: Wheel Chair Any new allergies or adverse reactions: No Arrival Time: 08:24 Had a fall or experienced change in No Accompanied By: wife activities of daily living that may affect Transfer Assistance: None risk of falls: Patient Identification Verified: Yes Signs or symptoms of abuse/neglect since last visito No Secondary Verification Process Completed: Yes Hospitalized since last visit: No Patient Has Alerts: Yes Implantable device outside of the clinic excluding No Patient Alerts: Type II Diabetic cellular tissue based products placed in the center Aspirin 81mg  since last visit: Has Dressing in Place as Prescribed: Yes Pain Present Now: No Electronic Signature(s) Signed: 07/07/2019 1:07:49 PM By: Lorine Bears RCP, RRT, CHT Entered By: Lorine Bears on 07/07/2019 08:25:22 [name] ([id number]) -------------------------------------------------------------------------------- Encounter Discharge Information Details Patient Name: [name], [name] Date of Service: 07/07/2019 8:15 AM Medical Record Number: [id number] Patient Account Number: [id number] Date of Birth/Sex: Jan 25, 1961 (58 y.o. M) Treating RN: [name] Primary Care Yasamin Karel: [name] Other Clinician: Referring Nate Perri: [name] Treating Faaris Arizpe/Extender: [name], HOYT Weeks in Treatment: 79 Encounter Discharge Information Items  Post Procedure Vitals Discharge Condition: Stable Temperature (F): 99.2 Ambulatory Status: Wheelchair Pulse (bpm): 79 Discharge Destination: Home Respiratory Rate (breaths/min): 16 Transportation: Private Auto Blood Pressure (mmHg): 97/50 Accompanied By: spouse Schedule Follow-up Appointment: Yes Clinical Summary of Care: Electronic Signature(s) Signed: 07/07/2019 4:20:48 PM By: [name] Entered By: [name] on 07/07/2019 09:14:03 [name] ([id number]) -------------------------------------------------------------------------------- Lower Extremity Assessment Details Patient Name: [name], [name] Date of Service: 07/07/2019 8:15 AM Medical Record Number: [id number] Patient Account Number: [id number] Date of Birth/Sex: 08-21-1961 (58 y.o. M) Treating RN: Army Melia Primary Care Atanacio Melnyk: [name] Other Clinician: Referring Anyia Gierke: [name] Treating Avry Monteleone/Extender: [name], HOYT Weeks in Treatment: 23 Electronic Signature(s) Signed: 07/07/2019 3:53:22 PM By: Army Melia Entered By: Army Melia on 07/07/2019 08:41:26 Clear, [name] ([id number]) -------------------------------------------------------------------------------- Multi Wound Chart Details Patient Name: [name], [name] Date of Service: 07/07/2019 8:15 AM Medical Record Number: [id number] Patient Account Number: [id number] Date of Birth/Sex: [birth year] (58 y.o. M) Treating RN: [name] Primary Care Berish Bohman: [name] Other Clinician: Referring Hareem Surowiec: [name] Treating Jacarri Gesner/Extender: STONE III, HOYT Weeks in Treatment: 23 Vital Signs Height(in): 38 Pulse(bpm): 79 Weight(lbs): 220 Blood Pressure(mmHg): 97/50 Body Mass Index(BMI): 34 Temperature(F): 99.2 Respiratory Rate 16 (breaths/min): Photos: [N/A:N/A] Wound Location: Right Amputation Site - Above Left Amputation Site - Below N/A Knee Knee Wounding Event: Surgical Injury Pressure Injury  N/A Primary Etiology: Arterial Insufficiency Ulcer Pressure Ulcer N/A Secondary Etiology: 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 Comorbid History: Arrhythmia, Congestive Heart Arrhythmia, Congestive Heart N/A Failure, Hypertension, Failure, Hypertension, Peripheral Arterial Disease, Peripheral Arterial Disease, Type II Diabetes, End Stage Type II Diabetes, End Stage Renal Disease Renal Disease Date Acquired: 09/09/2018 04/25/2019 N/A Weeks of Treatment: 23 6 N/A Wound Status: Healed - Epithelialized Open N/A Measurements L x W x D 0x0x0 0.6x1.5x0.2 N/A (cm) Area (cm) : 0 0.707 N/A Volume (cm) : 0 0.141 N/A % Reduction in Area: 100.00% -50.10% N/A %  Reduction in Volume: 100.00% 0.00% N/A Classification: Full Thickness Without Category/Stage III N/A Exposed Support Structures Exudate Amount: None Present Medium N/A Exudate Type: N/A Serous N/A Exudate Color: N/A amber N/A Wound Margin: Thickened Flat and Intact N/A Granulation Amount: None Present (0%) Small (1-33%) N/A Granulation Quality: N/A Pink N/A Necrotic Amount: Large (67-100%) Large (67-100%) N/A TOMMIE, JAVENS. ([id number]) Necrotic Tissue: Eschar, [facility] N/A Exposed Structures: Fat Layer (Subcutaneous Fat Layer (Subcutaneous N/A Tissue) Exposed: Yes Tissue) Exposed: Yes Fascia: No Fascia: No Tendon: No Tendon: No Muscle: No Muscle: No Joint: No Joint: No Bone: No Bone: No Epithelialization: Large (67-100%) None N/A Treatment Notes Electronic Signature(s) Signed: 07/07/2019 4:20:48 PM By: [name] Entered By: [name] on 07/07/2019 09:08:17 [name] ([id number]) -------------------------------------------------------------------------------- [place] Details Patient Name: [name], [name] Date of Service: 07/07/2019 8:15 AM Medical Record Number: [id number] Patient Account Number: [id number] Date  of Birth/Sex: 1961/08/22 (58 y.o. M) Treating RN: [name] Primary Care Cybil Senegal: [name] Other Clinician: Referring Bettyjane Shenoy: [name] Treating Antwaun Buth/Extender: [name], HOYT Weeks in Treatment: 23 Active Inactive Electronic Signature(s) Signed: 08/04/2019 7:44:10 AM By: Gretta Cool, BSN, RN, CWS, Kim RN, BSN Signed: 09/02/2019 4:47:56 PM By: [name] Previous Signature: 07/07/2019 4:20:48 PM Version By: [name] Entered By: Gretta Cool BSN, RN, CWS, Kim on 08/04/2019 07:44:09 [name] ([id number]) -------------------------------------------------------------------------------- Pain Assessment Details Patient Name: [name], [name] Date of Service: 07/07/2019 8:15 AM Medical Record Number: [id number] Patient Account Number: [id number] Date of Birth/Sex: 11-11-1960 (58 y.o. M) Treating RN: [name] Primary Care Danon Lograsso: [name] Other Clinician: Referring Clemmie Marxen: [name] Treating Anais Koenen/Extender: STONE III, HOYT Weeks in Treatment: 23 Active Problems Location of Pain Severity and Description of Pain Patient Has Paino No Site Locations Pain Management and Medication Current Pain Management: Electronic Signature(s) Signed: 07/07/2019 1:07:49 PM By: Paulla Fore, RRT, CHT Signed: 07/07/2019 4:20:48 PM By: [name] Entered By: Lorine Bears on 07/07/2019 08:25:30 [name] ([id number]) -------------------------------------------------------------------------------- Patient/Caregiver Education Details Patient Name: [name], Jb L. Date of Service: 07/07/2019 8:15 AM Medical Record Number: [id number] Patient Account Number: [id number] Date of Birth/Gender: 06-Jul-1961 (58 y.o. M) Treating RN: [name] Primary Care Physician: [name] Other Clinician: Referring Physician: [name] Treating Physician/Extender: Sharalyn Ink in Treatment: 89 Education  Assessment Education Provided To: Patient and Caregiver Education Topics Provided Wound/Skin Impairment: Handouts: Other: reportable s/s and wound care as ordered Methods: Explain/Verbal Responses: State content correctly Electronic Signature(s) Signed: 07/07/2019 4:20:48 PM By: [name] Entered By: [name] on 07/07/2019 09:12:52 [name] ([id number]) -------------------------------------------------------------------------------- Wound Assessment Details Patient Name: [name], [name] Date of Service: 07/07/2019 8:15 AM Medical Record Number: [id number] Patient Account Number: [id number] Date of Birth/Sex: Jul 12, 1961 (58 y.o. 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 Event: Surgical Injury Etiology: Date Acquired: 09/09/2018 Wound Healed - Epithelialized Weeks Of Treatment: 23 Status: Clustered Wound: No Comorbid Arrhythmia, Congestive Heart Failure, History: Hypertension, Peripheral Arterial Disease, Type II Diabetes, End Stage Renal Disease Photos Wound Measurements Length: (cm) 0 % Reduc Width: (cm) 0 % Reduc Depth: (cm) 0 Epithel Area: (cm) 0 Tunnel Volume: (cm) 0 Underm tion in Area: 100% tion in Volume: 100% ialization: Large (67-100%) ing:  No ining: No Wound Description Full Thickness Without Exposed Support Foul O Classification: Structures Slough Wound Margin: Thickened Exudate None Present Amount: dor After Cleansing: No /Fibrino Yes Wound Bed Granulation Amount: None Present (0%) Exposed Structure Necrotic Amount: Large (67-100%) Fascia Exposed: No Necrotic Quality: Eschar, Adherent Slough Fat  Layer (Subcutaneous Tissue) Exposed: Yes Tendon Exposed: No Muscle Exposed: No Joint Exposed: No Bone Exposed: No [name], [name] ([id number]) Electronic Signature(s) Signed: 07/07/2019 4:20:48 PM By: [name] Entered By: [name] on 07/07/2019 09:07:59 Inghram, [name] ([id number]) -------------------------------------------------------------------------------- Wound Assessment Details Patient Name: [name], [name] Date of Service: 07/07/2019 8:15 AM Medical Record Number: [id number] Patient Account Number: [id number] Date of Birth/Sex: 21-Sep-1961 (58 y.o. 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 Event: Pressure Injury Etiology: Date Acquired: 04/25/2019 Wound Open Weeks Of Treatment: 6 Status: Clustered Wound: No Comorbid Arrhythmia, Congestive Heart Failure, History: Hypertension, Peripheral Arterial Disease, Type II Diabetes, End Stage Renal Disease Photos Wound Measurements Length: (cm) 0.6 Width: (cm) 1.5 Depth: (cm) 0.2 Area: (cm) 0.707 Volume: (cm) 0.141 % Reduction in Area: -50.1% % Reduction in Volume: 0% Epithelialization: None Tunneling: No Undermining: No Wound Description Classification: Category/Stage III Wound Margin: Flat and Intact Exudate Amount: Medium Exudate Type: Serous Exudate Color: amber Foul Odor After Cleansing: No Slough/Fibrino Yes Wound Bed Granulation Amount: Small (1-33%) Exposed Structure Granulation Quality: Pink Fascia Exposed: No Necrotic Amount: Large (67-100%) Fat Layer (Subcutaneous Tissue) Exposed: Yes Necrotic Quality: Adherent Slough Tendon Exposed: No Muscle Exposed: No Joint Exposed:  No Bone Exposed: No [name], [name] ([id number]) Electronic Signature(s) Signed: 07/07/2019 3:53:22 PM By: Army Melia Entered By: Army Melia on 07/07/2019 08:40:41 Sustaita, [name] ([id number]) -------------------------------------------------------------------------------- Vitals Details Patient Name: [name], Phillipe L. Date of Service: 07/07/2019 8:15 AM Medical Record Number: [id number] Patient Account Number: [id number] Date of Birth/Sex: 01-28-1961 (58 y.o. M) Treating RN: [name] Primary Care Jerolyn Flenniken: [name] Other Clinician: Referring Queen Abbett: [name] Treating Dru Laurel/Extender: STONE III, HOYT Weeks in Treatment: 23 Vital Signs Time Taken: 08:25 Temperature (F): 99.2 Height (in): 67 Pulse (bpm): 79 Weight (lbs): 220 Respiratory Rate (breaths/min): 16 Body Mass Index (BMI): 34.5 Blood Pressure (mmHg): 97/50 Reference Range: 80 - 120 mg / dl Electronic Signature(s) Signed: 07/07/2019 1:07:49 PM By: Lorine Bears RCP, RRT, CHT Entered By: Becky Sax, Amado Nash on 07/07/2019 [id number]

## 2019-07-07 NOTE — Progress Notes (Addendum)
[name], [name] ([id number]) Visit Report for 07/07/2019 Chief Complaint Document Details Patient Name: [name] Date of Service: 07/07/2019 8:15 AM Medical Record Number: [id number] Patient Account Number: [id number] Date of Birth/Sex: 06-Nov-1960 (58 y.o. 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 Date of Service: 07/07/2019 8:15 AM Medical Record Number: [id number] Patient Account Number: [id number] Date of Birth/Sex: 03-01-1961 (58 y.o. M) Treating RN: [name] Primary Care Provider: [name] Other Clinician: Referring Provider: [name] Treating Provider/Extender: [name], HOYT Weeks in Treatment: 23 Debridement Performed for Wound #2 Left Amputation Site - Below Knee Assessment: Performed By: Physician STONE III, HOYT E., PA-C Debridement Type: Debridement Severity of Tissue Pre Fat layer exposed Debridement: Level of Consciousness (Pre- Awake and Alert procedure): Pre-procedure Verification/Time Yes - 09:09 Out Taken: Start Time: 09:09 Pain Control: Lidocaine 4% Topical Solution Total Area Debrided (L x W): 0.6 (cm) x 1.5 (cm) = 0.9 (cm) Tissue and other material Viable, Non-Viable, Slough, Subcutaneous, Slough debrided: Level: Skin/Subcutaneous Tissue Debridement Description: Excisional Instrument: Curette Bleeding: Minimum Hemostasis Achieved: Pressure End Time: 09:11 Procedural Pain: 0 Post Procedural Pain: 0 Response  to Treatment: Procedure was tolerated well Level of Consciousness Awake and Alert (Post-procedure): Post Debridement Measurements of Total Wound Length: (cm) 0.6 Stage: Category/Stage III Width: (cm) 1.5 Depth: (cm) 0.3 Volume: (cm) 0.212 Character of Wound/Ulcer Post Improved Debridement: Severity of Tissue Post Debridement: Fat layer exposed Post Procedure Diagnosis Same as Pre-procedure Electronic Signature(s) Signed: 07/07/2019 4:20:48 PM By: [name] Signed: 07/07/2019 6:05:24 PM By: Worthy Keeler PA-C Entered By: [name] on 07/07/2019 09:11:26 [name], [name] ([id number]) -------------------------------------------------------------------------------- HPI Details Patient Name: [name], [name] Date of Service: 07/07/2019 8:15 AM Medical Record Number: [id number] Patient Account Number: [id number] Date of Birth/Sex: 02/09/1961 (58 y.o. M) Treating RN: [name] Primary Care Provider: [name] Other Clinician: Referring Provider: [name] Treating Provider/Extender: [name], HOYT Weeks in Treatment: 23 History of Present Illness HPI Description: 01/27/19 patient presents today for initial evaluation here in our clinic concerning an issue he's been having with a wound at the right above knee amputation site which began shortly following the surgery in November 2019. 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 He has a history of hypertension, diabetes, congestive heart failure, and states that he is having a lot of discomfort at the wound site unfortunately. There does not appear to be any signs of active infection upon initial inspection today. No fevers, chills, nausea, or vomiting noted at this time.. Silvadene Cream has been utilized at some point on this  wound as well. Patient did have home health coming out but unfortunately the patient and his wife state that they were not happy with the services being rendered and therefore discontinued the service. There does not appear to be any signs of otherwise active infection at this time which is good news. The patient states he's definitely ready for this to heal and has been a symptomatic  time since his surgery and he is becoming somewhat frustrated. Iodoflex also sounds to have previously been used on the wound that is when the patient's wife states the wound seem to get worse not better. As best I can tell [name] has not been utilized. 02/03/19 on evaluation today patient appears to be doing much better compared to last week's evaluation in regard to his ulcer on the right amputation site. He's been tolerating the dressing changes without complication which is good news. The Santyl does seem to be of benefit in fact the wound appears to be doing significantly better compared to my prior evaluation. Overall very pleased in this regard. No fevers, chills, nausea, or vomiting noted at this time. 02/10/19 on evaluation today patient's wound actually appears to be showing signs of excellent improvement at this time which is great news. He has been tolerating the dressing changes without complication overall I feel like that there is no signs of active infection at this point which is also good news. I feel like that the [name] is very beneficial. Patient's wife likewise feels like things are doing quite well and is not having pain like he was in the past. 02/17/19 on evaluation today patient appears to be doing well in regard to the wound on the distal aspect of his right amputation site. He's been tolerating treatment with the Santyl at this point. I think he is nearing completion as far as treatment with cental is concerned I think were very close to getting this significantly better. Fortunately there's no  evidence of infection currently. 03/03/19 on evaluation today patient actually appears to be doing quite well in regard to his his right imputation site ulceration. In fact there's a lot of new skin growth which is excellent news. I'm very pleased in this regard. Fortunately there's no signs of active infection also good news. No fevers, chills, nausea, or vomiting noted at this time. 03/17/19 on evaluation today patient's wound actually appears to be showing signs of improvement which is good news. He does have a lot of issues however with concerns about how slowly this seems to be healing as far as he and his wife are concerned. I do believe some of the issues that were having currently are from the language barrier perspective but even that we have an interpreter that has been present for each evaluation I still think that there is some issues with their overall understanding of the treatment plan and how long wounds of this nature do take to heal. We began seeing him on April 14 and in just over a month and a half he has made improvement to where the wound is one third the size originally began. Nonetheless there still quite a bit of feeling left to do but again he has made great progress up to this point. Unfortunately he did have some issues with the silver collagen that was ordered he received something different from the [employer]. This was Colactive. Unfortunately this actually calls some burning he seemed to have some irritation from the dressing itself that caused irritation and redness around the edge of the wound that seems to be improving but nonetheless he and his wife both believe that the Santyl might've done better. They also really want to try to utilize something that will prevent me having to degree the wound as he typically has discomfort with the debridement obviously. 03/31/19 on evaluation today patient actually appears to be doing better in regard to his lower extremity ulcer.  He's  been tolerating the dressing changes without complication. Fortunately there's no signs of active infection at this time. He has a lot of new skin growth in the Santyl does seem to be working appropriately for him at this time. I'm very pleased in this regard. HANNA, COMINS ([id number]) 04/14/19 on evaluation today patient appears to be doing quite well in regard to his invitation site ulcer. This is a little smaller even than half the size of where it was when I saw him last. Fortunately there is no sign of active infection at this time which is good news. Overall he has been tolerating the dressing changes without complication still states that he has a lot of pain but again I really cannot see anything with regard to the wound that would be causing this and maybe more for neuropathy type pain. He states his weeks and up at night sometimes. 04/28/19-Patient presents at 2 weeks, with the right AKA wound, we have been using Santyl for this and there was mention of changing it to a different dressing this time around. 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 He's been tolerating the dressing changes with the Santyl unfortunately when we switched to the collagen dressing again last time he was seen in the office when I wasn't here his wound bed deteriorated as well in the following days. His wife came in to discuss this with me and subsequently we opted to go back to the [place] which seems to be doing excellent for him at this point. 05/26/2019 upon evaluation today patient actually appears to be doing excellent in regard to his wound on the right amputation site above the knee. In fact this appears to be almost completely healed which is great news. He has been utilizing Santyl. However he does have an issue on his left below-knee amputation stump where he has a  wound as well he did not want to show me his wife wanted him to. In the end he decided he would and we did evaluate that today as well. It is not a very deep wound but nonetheless is something that I think needs to be addressed sooner rather than later to prevent any additional issues at this point. 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 There may still be just a slight area where there some drainage but I really feel like most of the moisture is likely coming from the [place]. With that being said his left BKA ulcer is not doing quite as well there still a lot of Slough noted at this time and again there is some Epiboly as well this would require sharp debridement today. 06/23/2019 on evaluation today patient appears to be doing well with regard to his right ulcer on the distal stump. This actually is almost completely healed although there is a very tiny area still open. Subsequently he is also doing better with regard to the left distal stump location which he has been trying to be very careful about as far as not using his prosthesis anymore than he absolutely has to. I believe this shows and he has done a great job in this regard. 07/07/2019 on evaluation today patient actually appears to be doing well with regard to his amputation site on the right which is completely healed. On the left  he is apparently been using his prosthesis way too much and I think that this has actually caused the area to have a more difficult time healing. Overall I am not is pleased with this although the wound bed has a good base to it there was some slough noted this can require debridement the biggest issue that I see in my opinion is going to be getting him to not use his prosthesis in order to allow this to heal. Electronic Signature(s) Signed: 07/07/2019 5:57:49 PM By: Worthy Keeler PA-C Entered By: Worthy Keeler on  07/07/2019 17:57:49 [name] ([id number]) -------------------------------------------------------------------------------- Physical Exam Details Patient Name: [name], [name] Date of Service: 07/07/2019 8:15 AM Medical Record Number: [id number] Patient Account Number: [id number] Date of Birth/Sex: [birth year] (58 y.o. M) Treating RN: [name] Primary Care Provider: [name] Other Clinician: Referring Provider: [name] Treating Provider/Extender: STONE III, HOYT Weeks in Treatment: 47 Constitutional Well-nourished and well-hydrated in no acute distress. Respiratory normal breathing without difficulty. clear to auscultation bilaterally. Cardiovascular regular rate and rhythm with normal S1, S2. Psychiatric this patient is able to make decisions and demonstrates good insight into disease process. Alert and Oriented x 3. pleasant and cooperative. Notes Upon inspection today patient's wound bed actually showed signs of good granulation at this time. There does not appear to be any evidence of active infection which is also good news. He has no real edema on the left which is also good news. Overall I feel like he has done quite well and has healed well on the right now on the left side we will get a try to get this final area closed. I think if he could use the prosthesis less are really not at all that he would be much better off. Electronic Signature(s) Signed: 07/07/2019 5:58:23 PM By: Worthy Keeler PA-C Entered By: Worthy Keeler on 07/07/2019 17:58:23 [name] ([id number]) -------------------------------------------------------------------------------- Physician Orders Details Patient Name: Kristopher Haggard L. Date of Service: 07/07/2019 8:15 AM Medical Record Number: [id number] Patient Account Number: [id number] Date of Birth/Sex: [birth year] (58 y.o. 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 Anesthetic (add to Medication List) Wound #2 Left Amputation Site - Below Knee o Topical Lidocaine 4% cream applied to wound bed prior to debridement (In Clinic Only). Primary Wound Dressing Wound #2 Left Amputation Site - Below Knee o Santyl Ointment o Foam - foam donut around wound Secondary Dressing Wound #2 Left Amputation Site - Below Knee o Boardered Foam Dressing Dressing Change Frequency Wound #2 Left Amputation Site - Below Knee o Change dressing every day. Follow-up Appointments o Return Appointment in 2 weeks. [name], [name] ([id number]) Electronic Signature(s) Signed: 07/07/2019 4:20:48 PM By: [name]  Signed: 07/07/2019 6:05:24 PM By: Worthy Keeler PA-C Entered By: [name] on 07/07/2019 09:14:47 [name] ([id number]) -------------------------------------------------------------------------------- Problem List Details Patient Name: [name], Dijuan L. Date of Service: 07/07/2019 8:15 AM Medical Record Number: [id number] Patient Account Number: [id number] Date of Birth/Sex: 28-Nov-1960 (58  y.o. M) Treating RN: [name] Primary Care Provider: [name] Other Clinician: Referring Provider: [name] Treating Provider/Extender: [name], HOYT Weeks in Treatment: 23 Active Problems ICD-10 Evaluated Encounter Code Description Active Date Today Diagnosis L97.812 Non-pressure chronic ulcer of other part of right lower leg 01/27/2019 No Yes with fat layer exposed Z89.611 Acquired absence of right leg above knee 01/27/2019 No Yes Z89.512 Acquired absence of left leg below knee 05/26/2019 No Yes L97.822 Non-pressure chronic ulcer of other part of left lower leg with 05/26/2019 No Yes fat layer exposed I73.89 Other specified peripheral vascular diseases 01/27/2019 No Yes E11.622 Type 2 diabetes mellitus with other skin ulcer 01/27/2019 No Yes I50.42 Chronic combined systolic (congestive) and diastolic [id number] No Yes (congestive) heart failure N18.6 End stage renal disease 01/27/2019 No Yes Z99.2 Dependence on renal dialysis 01/27/2019 No Yes I10 Essential (primary) hypertension 01/27/2019 No Yes Tuite, Jarret L. ([id number]) I48.0 Paroxysmal atrial fibrillation 01/27/2019 No Yes Inactive Problems Resolved Problems Electronic Signature(s) Signed: 07/07/2019 8:25:18 AM By: Worthy Keeler PA-C Entered By: Worthy Keeler on 07/07/2019 08:25:18 [name] ([id number]) -------------------------------------------------------------------------------- Progress Note Details Patient Name: [name], Avrom L. Date of Service: 07/07/2019 8:15 AM Medical Record Number: [id number] Patient Account Number: [id number] Date of Birth/Sex: 1961/06/03 (58 y.o. 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 He has a history of hypertension, diabetes, congestive heart failure, and states that he is having a lot of discomfort at the wound site unfortunately. There does not appear to be any signs of active infection upon initial inspection today. No fevers, chills, nausea, or vomiting noted at this time.. Silvadene Cream has been utilized at some point on this wound as well. Patient did have home health coming out but unfortunately the patient and his wife state that they were not happy with the services being rendered and therefore discontinued the service. There does not appear to be any signs of otherwise active infection at this time which is good news. The patient states he's definitely ready for this to heal and has been a symptomatic time since his surgery and he is becoming somewhat frustrated. Iodoflex also sounds to have previously been used on the wound that is when the patient's wife states the wound seem to get worse not better. As best I can tell [name] has not been utilized. 02/03/19 on evaluation today patient appears to be doing much better compared to last week's evaluation in regard to his ulcer on the right amputation site. He's been tolerating the dressing changes without complication which is good news. The  Santyl does seem to be of benefit in fact the wound appears to be doing significantly better compared to my prior evaluation. Overall very pleased in this regard. No fevers, chills, nausea, or vomiting noted at this time. 02/10/19 on  evaluation today patient's wound actually appears to be showing signs of excellent improvement at this time which is great news. He has been tolerating the dressing changes without complication overall I feel like that there is no signs of active infection at this point which is also good news. I feel like that the [name] is very beneficial. Patient's wife likewise feels like things are doing quite well and is not having pain like he was in the past. 02/17/19 on evaluation today patient appears to be doing well in regard to the wound on the distal aspect of his right amputation site. He's been tolerating treatment with the Santyl at this point. I think he is nearing completion as far as treatment with cental is concerned I think were very close to getting this significantly better. Fortunately there's no evidence of infection currently. 03/03/19 on evaluation today patient actually appears to be doing quite well in regard to his his right imputation site ulceration. In fact there's a lot of new skin growth which is excellent news. I'm very pleased in this regard. Fortunately there's no signs of active infection also good news. No fevers, chills, nausea, or vomiting noted at this time. 03/17/19 on evaluation today patient's wound actually appears to be showing signs of improvement which is good news. He does have a lot of issues however with concerns about how slowly this seems to be healing as far as he and his wife are concerned. I do believe some of the issues that were having currently are from the language barrier perspective but even that we have an interpreter that has been present for each evaluation I still think that there is some issues with their overall understanding of the treatment plan and how long wounds of this nature do take to heal. We began seeing him on April 14 and in just over a month and a half he has made improvement to where the wound is one third the size originally  began. Nonetheless there still quite a bit of feeling left to do but again he has made great progress up to this point. Unfortunately he did have some issues with the silver collagen that was ordered he received something different from the [employer]. This was Colactive. Unfortunately this actually calls some burning he seemed to have some irritation from the dressing itself that caused irritation and redness around the edge of the wound that seems to be improving but nonetheless he and his wife both [name], [name] ([id number]) believe that the Santyl might've done better. They also really want to try to utilize something that will prevent me having to degree the wound as he typically has discomfort with the debridement obviously. 03/31/19 on evaluation today patient actually appears to be doing better in regard to his lower extremity ulcer. He's been tolerating the dressing changes without complication. Fortunately there's no signs of active infection at this time. He has a lot of new skin growth in the Santyl does seem to be working appropriately for him at this time. I'm very pleased in this regard. 04/14/19 on evaluation today patient appears to be doing quite well in regard to his invitation site ulcer. This is a little smaller even than half the size of where it was when I  saw him last. Fortunately there is no sign of active infection at this time which is good news. Overall he has been tolerating the dressing changes without complication still states that he has a lot of pain but again I really cannot see anything with regard to the wound that would be causing this and maybe more for neuropathy type pain. He states his weeks and up at night sometimes. 04/28/19-Patient presents at 2 weeks, with the right AKA wound, we have been using Santyl for this and there was mention of changing it to a different dressing this time around. Overall patient appears to be improving 05/12/19 on evaluation today  patient actually appears to be doing excellent in regard to his distal right above knee amputation site ulcer. He's been tolerating the dressing changes with the Santyl unfortunately when we switched to the collagen dressing again last time he was seen in the office when I wasn't here his wound bed deteriorated as well in the following days. His wife came in to discuss this with me and subsequently we opted to go back to the [place] which seems to be doing excellent for him at this point. 05/26/2019 upon evaluation today patient actually appears to be doing excellent in regard to his wound on the right amputation site above the knee. In fact this appears to be almost completely healed which is great news. He has been utilizing Santyl. However he does have an issue on his left below-knee amputation stump where he has a wound as well he did not want to show me his wife wanted him to. In the end he decided he would and we did evaluate that today as well. It is not a very deep wound but nonetheless is something that I think needs to be addressed sooner rather than later to prevent any additional issues at this point. 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 There may still be just a slight area where there some drainage but I really feel like most of the moisture is likely coming from the [place]. With that being said his left BKA ulcer is not doing quite as well there still a lot of Slough noted at this time and again there is some Epiboly as well this would require sharp debridement today. 06/23/2019 on evaluation today patient appears to be doing well with regard to his right ulcer on the distal stump. This actually is almost completely healed although there is a very tiny area still open. Subsequently he is also doing better with regard to the left distal stump location which he has been trying to be very careful  about as far as not using his prosthesis anymore than he absolutely has to. I believe this shows and he has done a great job in this regard. 07/07/2019 on evaluation today patient actually appears to be doing well with regard to his amputation site on the right which is completely healed. On the left he is apparently been using his prosthesis way too much and I think that this has actually caused the area to have a more difficult time healing. Overall I am not is pleased with this although the wound bed has a good base to it there was some slough noted this can require debridement the biggest issue that I see in my opinion is going to be getting him to not use his prosthesis in order to allow this to heal. Patient History Information  obtained from Patient. Family History Cancer - Father, Diabetes - Siblings, No family history of Hereditary Spherocytosis. Social History Former smoker - ended on 10/16/1994, Marital Status - Married, Alcohol Use - Never, Drug Use - No History, Caffeine Use - Rarely. Medical History Cardiovascular Patient has history of Arrhythmia - A-fib, Congestive Heart Failure, Hypertension, Peripheral Arterial Disease - Bilateral LE Clinard, Lyden L. ([id number]) amputations Endocrine Patient has history of Type II Diabetes Genitourinary Patient has history of End Stage Renal Disease - Dialysis M,W,F Hospitalization/Surgery History - Right AKA. - Left BKA. 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 Respiratory Denies complaints or symptoms of Chronic or frequent coughs, Shortness of Breath. Cardiovascular Denies complaints or symptoms of Chest pain, LE edema. Psychiatric Denies complaints or symptoms of Anxiety,  Claustrophobia. Objective Constitutional Well-nourished and well-hydrated in no acute distress. Vitals Time Taken: 8:25 AM, Height: 67 in, Weight: 220 lbs, BMI: 34.5, Temperature: 99.2 F, Pulse: 79 bpm, Respiratory Rate: 16 breaths/min, Blood Pressure: 97/50 mmHg. Respiratory normal breathing without difficulty. clear to auscultation bilaterally. Cardiovascular regular rate and rhythm with normal S1, S2. Psychiatric this patient is able to make decisions and demonstrates good insight into disease process. Alert and Oriented x 3. pleasant and cooperative. General Notes: Upon inspection today patient's wound bed actually showed signs of good granulation at this time. There does not appear to be any evidence of active infection which is also good news. He has no real edema on the left which is also good news. Overall I feel like he has done quite well and has healed well on the right now on the left side we will get a try to get this final area closed. I think if he could use the prosthesis less are really not at all that he would be much better off. Integumentary (Hair, Skin) Wound #1 status is Healed - Epithelialized. Original cause of wound was Surgical Injury. The wound is located on the Right Amputation Site - Above Knee. The wound measures 0cm length x 0cm width x 0cm depth; 0cm^2 area and 0cm^3 volume. There is Fat Layer (Subcutaneous Tissue) Exposed exposed. There is no tunneling or undermining noted. There is a none [name], [name] ([id number]) present amount of drainage noted. The wound margin is thickened. There is no granulation within the wound bed. There is a large (67-100%) amount of necrotic tissue within the wound bed including Eschar and Adherent Slough. Wound #2 status is Open. Original cause of wound was Pressure Injury. The wound is located on the Left Amputation Site - Below Knee. The wound measures 0.6cm length x 1.5cm width x 0.2cm depth; 0.707cm^2 area and 0.141cm^3  volume. There is Fat Layer (Subcutaneous Tissue) Exposed exposed. There is no tunneling or undermining noted. There is a medium amount of serous drainage noted. The wound margin is flat and intact. There is small (1-33%) pink granulation within the wound bed. There is a large (67-100%) amount of necrotic tissue within the wound bed including Adherent Slough. 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 There was a Excisional Skin/Subcutaneous Tissue Debridement with a total area of 0.9 sq cm performed by STONE III, HOYT E., PA-C. With the following instrument(s): Curette to remove Viable and Non-Viable tissue/material. Material removed includes Subcutaneous Tissue and Slough and after achieving pain control using Lidocaine 4% Topical Solution. No specimens were taken. A time out was conducted at 09:09, prior to the start of the procedure. A Minimum amount of bleeding was controlled with Pressure. The procedure was tolerated well with a pain level of 0 throughout and a pain level of 0 following the procedure. Post Debridement Measurements: 0.6cm length x 1.5cm width x 0.3cm depth; 0.212cm^3 volume. Post debridement Stage noted as Category/Stage III. Character of Wound/Ulcer Post Debridement is  improved. Severity of Tissue Post Debridement is: Fat layer exposed. Post procedure Diagnosis Wound #2: Same as Pre-Procedure Plan Wound Cleansing: Wound #2 Left Amputation Site - Below Knee: Clean wound with Normal Saline. [name], [name] ([id number]) Anesthetic (add to Medication List): Wound #2 Left Amputation Site - Below Knee: Topical Lidocaine 4% cream applied to wound bed prior to debridement (In Clinic Only). Primary Wound Dressing: Wound #2 Left Amputation Site - Below Knee: Santyl Ointment Foam - foam donut around wound Secondary Dressing: Wound #2 Left Amputation Site - Below Knee: Boardered Foam Dressing Dressing Change Frequency: Wound #2 Left Amputation Site - Below Knee: Change dressing every day. Follow-up Appointments: Return Appointment in 2 weeks. 1. Patient's wound bed currently on the left did require some sharp debridement that I performed today which post debridement appears to be doing better. I am get a recommend currently that we continue with the Santyl ointment for the time being although again the wound is staying pretty clean I think the biggest issue is good to be keeping him from wearing the prosthesis. 2. There is no signs of infection I do not believe he needs any antibiotics at this time I do believe that continuing with a border foam dressing is good for offloading purposes. We are going to try however to do a doughnut foam underneath the border foam in order to try to more appropriately offload the area and see if this helps. If not he is going to have to progress to using the prosthesis less. We will see patient back for reevaluation in 2 weeks here in the clinic. If anything worsens or changes patient will contact our office for additional recommendations. Electronic Signature(s) Signed: 07/07/2019 5:59:31 PM By: Worthy Keeler PA-C Entered By: Worthy Keeler on 07/07/2019 17:59:31 [name]  ([id number]) -------------------------------------------------------------------------------- ROS/PFSH Details Patient Name: Kristopher Haggard L. Date of Service: 07/07/2019 8:15 AM Medical Record Number: [id number] Patient Account Number: [id number] Date of Birth/Sex: 12-07-1960 (58 y.o. 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 Respiratory Complaints and Symptoms: Negative for: Chronic or frequent coughs; Shortness of Breath Cardiovascular  Complaints and Symptoms: Negative for: Chest pain; LE edema Medical History: Positive for: Arrhythmia - A-fib; Congestive Heart Failure; Hypertension; Peripheral Arterial Disease - Bilateral LE amputations Psychiatric Complaints and Symptoms: Negative for: Anxiety; Claustrophobia Endocrine Medical History: Positive for: Type II Diabetes Time with diabetes: 14 years Treated with: Oral agents Blood sugar tested every day: No Blood sugar testing results: Breakfast: 128; Lunch: 145 Genitourinary Medical History: Positive for: End Stage Renal Disease - Dialysis M,W,F Immunizations [name], RIMANDO. ([id number]) Pneumococcal Vaccine: Received Pneumococcal Vaccination: Yes Implantable Devices None Hospitalization / Surgery History Type of Hospitalization/Surgery Right AKA Left BKA Family and Social History Cancer: Yes - Father; Diabetes: Yes - Siblings; Hereditary Spherocytosis: No; Former smoker - ended on 10/16/1994; Marital Status - Married; Alcohol Use: Never; Drug Use: No History; Caffeine Use: Rarely Physician  Affirmation I have reviewed and agree with the above information. Electronic Signature(s) Signed: 07/07/2019 6:05:24 PM By: Worthy Keeler PA-C Signed: 07/08/2019 5:03:49 PM By: [name] Entered By: Worthy Keeler on 07/07/2019 17:58:05 [name], [name] ([id number]) -------------------------------------------------------------------------------- [place] Details Patient Name: [name], Dany L. Date of Service: 07/07/2019 Medical Record Number: [id number] Patient Account Number: [id number] Date of Birth/Sex: [birth year] (58 y.o. M) Treating RN: [name] Primary Care Provider: [name] Other Clinician: Referring Provider: [name] Treating Provider/Extender: [name], HOYT Weeks in Treatment: 23 Diagnosis Coding ICD-10 Codes Code Description [phone] Non-pressure chronic ulcer of other part of right lower leg with fat layer exposed Z89.611 Acquired absence of right leg above knee Z89.512 Acquired absence of left leg below knee L97.822 Non-pressure chronic ulcer of other part of left lower leg with fat layer exposed I73.89 Other specified peripheral vascular diseases E11.622 Type 2 diabetes mellitus with other skin ulcer I50.42 Chronic combined systolic (congestive) and diastolic (congestive) heart failure N18.6 End stage renal disease Z99.2 Dependence on renal dialysis I10 Essential (primary) hypertension I48.0 Paroxysmal atrial fibrillation Facility Procedures CPT4 Code Description: [id number] - DEB SUBQ TISSUE 20 SQ CM/< ICD-10 Diagnosis Description L97.822 Non-pressure chronic ulcer of other part of left lower leg wit Modifier: h fat layer expos Quantity: 1 ed Physician Procedures CPT4 Code Description: [id number] - WC PHYS SUBQ TISS 20 SQ CM ICD-10 Diagnosis Description L97.822 Non-pressure chronic ulcer of other part of left lower leg wit Modifier: h fat layer expos Quantity: 1 ed Electronic Signature(s) Signed: 07/07/2019 5:59:48 PM By: Worthy Keeler PA-C Entered By: Worthy Keeler on 07/07/2019 17:59:48

## 2019-07-07 NOTE — Progress Notes (Signed)
SUBJECTIVE:  Patient ID: [name], male    DOB: 01-Apr-1961, 58 y.o.   MRN: [id number] Chief Complaint  Patient presents with  . Follow-up    ultrasound follow up    HPI  [name] is a 58 y.o. male The patient returns to the office for follow up regarding problem with the dialysis access. Currently the patient is maintained via a left radiocephalic AV fistula.    The patient notes a significant increase in bleeding time after decannulation.  The patient has also been informed that there is increased recirculation.    The patient denies hand pain or other symptoms consistent with steal phenomena.  No significant arm swelling.  The patient denies redness or swelling at the access site. The patient denies fever or chills at home or while on dialysis.  The patient denies amaurosis fugax or recent TIA symptoms. There are no recent neurological changes noted. The patient denies claudication symptoms or rest pain symptoms. The patient denies history of DVT, PE or superficial thrombophlebitis. The patient denies recent episodes of angina or shortness of breath.   Today the patient has a flow volume of 290.  Previous study on 08/28/2018 has a flow volume of 1080.  The distal forearm has a significant stenosis with a change in diameter narrowing to 0.17 cm.   Past Medical History:  Diagnosis Date  . (HFpEF) heart failure with preserved ejection fraction ([place])    a. 2017 EF per LHC 30-35%; b. 09/2018 Echo: EF 55-60%, no rwma, Gr2 DD, mild MR, mildly dil LA. PASP 55mmHg. small to mod circumferential pericardial eff, no tamponade.  . Anemia of chronic disease   . Atherosclerosis   . CAD (coronary artery disease)    a. 2017 LHC, severe 1v disease, occluded pRCA, L-R collaterals, moderate LV dysfunction EF 30-35.  . Diabetes mellitus with complication ([place])   . End stage renal disease on dialysis ([place])    Labile BP  . History of ventricular tachycardia    a. 2017 polymorphic VT and  cardiac arrest.  . Hypercholesteremia   . Hyperlipidemia LDL goal <70   . Hypertension   . Labile blood pressure    on HD  . NSVT (nonsustained ventricular tachycardia) ([place])    08/2018 NSVT  . PAF (paroxysmal atrial fibrillation) ([place])    a. 08/2018 CHA2DS2VASc at least 6-->initially placed on eliquis but d/c'd 2/2 anemia. On Amio.  Marland Kitchen Peripheral arterial occlusive disease (HCC)    s/p L BKA, R AKA  . PONV (postoperative nausea and vomiting)   . S/P AKA (above knee amputation) unilateral, right ([place])    08/2018  . S/P BKA (below knee amputation) unilateral, left ([place])    2015  . Stroke [facility])     Past Surgical History:  Procedure Laterality Date  . A/V FISTULAGRAM Left 01/09/2017   Procedure: A/V Fistulagram;  Surgeon: [name], MD;  Location: [place] [facility];  Service: Cardiovascular;  Laterality: Left;  . A/V FISTULAGRAM Left 06/06/2017   Procedure: A/V Fistulagram;  Surgeon: [name], MD;  Location: [place] [facility];  Service: Cardiovascular;  Laterality: Left;  . A/V FISTULAGRAM Left 08/15/2017   Procedure: A/V Fistulagram;  Surgeon: [name], MD;  Location: [place] [facility];  Service: Cardiovascular;  Laterality: Left;  . A/V FISTULAGRAM Left 01/22/2018   Procedure: A/V FISTULAGRAM;  Surgeon: [name], MD;  Location: [place] [facility];  Service: Cardiovascular;  Laterality: Left;  .  A/V SHUNT INTERVENTION N/A 06/06/2017   Procedure: A/V SHUNT INTERVENTION;  Surgeon: [name], MD;  Location: [place] [facility];  Service: Cardiovascular;  Laterality: N/A;  . AMPUTATION Right 04/25/2016   Procedure: TOE AMPUTATION;  Surgeon: Katha Cabal, MD;  Location: [facility];  Service: Vascular;  Laterality: Right;  . AMPUTATION Right 09/09/2018   Procedure: AMPUTATION ABOVE KNEE;  Surgeon: [name], MD;  Location: [facility];  Service: Vascular;  Laterality: Right;  . APPLICATION OF WOUND VAC Right 04/25/2016   Procedure: APPLICATION OF WOUND VAC;   Surgeon: Katha Cabal, MD;  Location: [facility];  Service: Vascular;  Laterality: Right;  . APPLICATION OF WOUND VAC Right 12/18/2018   Procedure: APPLICATION OF WOUND VAC;  Surgeon: [name], MD;  Location: [facility];  Service: Vascular;  Laterality: Right;  wound vac  . AV FISTULA PLACEMENT Left 2015  . CARDIAC CATHETERIZATION N/A 04/30/2016   Procedure: Left Heart Cath and Coronary Angiography;  Surgeon: Wellington Hampshire, MD;  Location: [place] [facility];  Service: Cardiovascular;  Laterality: N/A;  . CATARACT EXTRACTION, BILATERAL    . LEG AMPUTATION BELOW KNEE Left   . LOWER EXTREMITY ANGIOGRAPHY Right 07/24/2018   Procedure: LOWER EXTREMITY ANGIOGRAPHY;  Surgeon: [name], MD;  Location: [place] [facility];  Service: Cardiovascular;  Laterality: Right;  . LOWER EXTREMITY ANGIOGRAPHY Right 08/13/2018   Procedure: LOWER EXTREMITY ANGIOGRAPHY;  Surgeon: [name], MD;  Location: [place] [facility];  Service: Cardiovascular;  Laterality: Right;  . LOWER EXTREMITY ANGIOGRAPHY Right 09/04/2018   Procedure: Lower Extremity Angiography;  Surgeon: [name], MD;  Location: [place] [facility];  Service: Cardiovascular;  Laterality: Right;  . PERIPHERAL VASCULAR CATHETERIZATION N/A 10/03/2015   Procedure: A/V Shuntogram/Fistulagram;  Surgeon: [name], MD;  Location: [place] [facility];  Service: Cardiovascular;  Laterality: N/A;  . PERIPHERAL VASCULAR CATHETERIZATION N/A 01/16/2016   Procedure: Abdominal Aortogram w/Lower Extremity;  Surgeon: [name], MD;  Location: [place] [facility];  Service: Cardiovascular;  Laterality: N/A;  . PERIPHERAL VASCULAR CATHETERIZATION  01/16/2016   Procedure: Lower Extremity Intervention;  Surgeon: [name], MD;  Location: [place] [facility];  Service: Cardiovascular;;  . PERIPHERAL VASCULAR CATHETERIZATION Right 04/27/2016   Procedure: Lower Extremity Angiography;  Surgeon: Katha Cabal, MD;  Location: [place] [facility];   Service: Cardiovascular;  Laterality: Right;  . PERIPHERAL VASCULAR CATHETERIZATION  04/27/2016   Procedure: Lower Extremity Intervention;  Surgeon: Katha Cabal, MD;  Location: [place] [facility];  Service: Cardiovascular;;  . TRANSMETATARSAL AMPUTATION Right 04/28/2016   Procedure: TRANSMETATARSAL AMPUTATION;  Surgeon: Albertine Patricia, DPM;  Location: [facility];  Service: Podiatry;  Laterality: Right;  . WOUND DEBRIDEMENT Right 04/25/2016   Procedure: DEBRIDEMENT WOUND;  Surgeon: Katha Cabal, MD;  Location: [facility];  Service: Vascular;  Laterality: Right;  . WOUND DEBRIDEMENT Right 12/18/2018   Procedure: DEBRIDEMENT WOUND;  Surgeon: [name], MD;  Location: [facility];  Service: Vascular;  Laterality: Right;  right stump    Social History   Socioeconomic History  . Marital status: Married    Spouse name: Not on file  . Number of children: Not on file  . Years of education: Not on file  . Highest education level: Not on file  Occupational History  . Not on file  Social Needs  . Financial resource strain: Not on file  . Food insecurity  Worry: Patient refused    Inability: Patient refused  . Transportation needs    Medical: No    Non-medical: No  Tobacco Use  . Smoking status: Never Smoker  . Smokeless tobacco: Never Used  Substance and Sexual Activity  . Alcohol use: No  . Drug use: No  . Sexual activity: Not on file  Lifestyle  . Physical activity    Days per week: Not on file    Minutes per session: Not on file  . Stress: Not at all  Relationships  . Social connections    Talks on phone: More than three times a week    Gets together: Three times a week    Attends religious service: Not on file    Active member of club or organization: Not on file    Attends meetings of clubs or organizations: Not on file    Relationship status: Not on file  . Intimate partner violence    Fear of current or ex partner: No    Emotionally abused: Not on file     Physically abused: Not on file    Forced sexual activity: Not on file  Other Topics Concern  . Not on file  Social History Narrative  . Not on file    Family History  Problem Relation Age of Onset  . Diabetes Father   . Prostate cancer Father     No Known Allergies   Review of Systems   Review of Systems: Negative Unless Checked Constitutional: [] Weight loss  [] Fever  [] Chills Cardiac: [] Chest pain   []  Atrial Fibrillation  [] Palpitations   [] Shortness of breath when laying flat   [] Shortness of breath with exertion. [] Shortness of breath at rest Vascular:  [] Pain in legs with walking   [] Pain in legs with standing [] Pain in legs when laying flat   [] Claudication    [] Pain in feet when laying flat    [] History of DVT   [] Phlebitis   [] Swelling in legs   [] Varicose veins   [] Non-healing ulcers Pulmonary:   [] Uses home oxygen   [] Productive cough   [] Hemoptysis   [] Wheeze  [] COPD   [] Asthma Neurologic:  [] Dizziness   [] Seizures  [] Blackouts [] History of stroke   [] History of TIA  [] Aphasia   [] Temporary Blindness   [] Weakness or numbness in arm   [x] Weakness or numbness in leg Musculoskeletal:   [] Joint swelling   [] Joint pain   [] Low back pain  []  History of Knee Replacement [] Arthritis [] back Surgeries  []  Spinal Stenosis    Hematologic:  [] Easy bruising  [] Easy bleeding   [] Hypercoagulable state   [x] Anemic Gastrointestinal:  [] Diarrhea   [] Vomiting  [] Gastroesophageal reflux/heartburn   [] Difficulty swallowing. [] Abdominal pain Genitourinary:  [x] Chronic kidney disease   [] Difficult urination  [] Anuric   [] Blood in urine [] Frequent urination  [] Burning with urination   [] Hematuria Skin:  [] Rashes   [] Ulcers [] Wounds Psychological:  [] History of anxiety   []  History of major depression  []  Memory Difficulties      OBJECTIVE:   Physical Exam  There were no vitals taken for this visit.  Gen: WD/WN, NAD Head: [place]/AT, No temporalis wasting.  Ear/Nose/Throat: Hearing grossly  intact, nares w/o erythema or drainage Eyes: PER, EOMI, sclera nonicteric.  Neck: Supple, no masses.  No JVD.  Pulmonary:  Good air movement, no use of accessory muscles.  Cardiac: RRR Vascular:  Weak thrill and bruit Vessel Right Left  Radial Palpable Not Palpable   Gastrointestinal: soft, non-distended. No guarding/no  peritoneal signs.  Musculoskeletal: Right above-knee amputation with left below-knee amputation Neurologic: Pain and light touch intact in extremities.  Symmetrical.  Speech is fluent. Motor exam as listed above. Psychiatric: Judgment intact, Mood & affect appropriate for pt's clinical situation. Dermatologic: No Venous rashes. No Ulcers Noted.  No changes consistent with cellulitis. Lymph : No Cervical lymphadenopathy, no lichenification or skin changes of chronic lymphedema.       ASSESSMENT AND PLAN:  1. End stage renal disease on dialysis [facility]) Recommend:  The patient is experiencing increasing problems with their dialysis access.  Patient should have a fistulagram with the intention for intervention.  The intention for intervention is to restore appropriate flow and prevent thrombosis and possible loss of the access.  As well as improve the quality of dialysis therapy.  The risks, benefits and alternative therapies were reviewed in detail with the patient.  All questions were answered.  The patient agrees to proceed with angio/intervention.      2. Type 2 diabetes mellitus with diabetic peripheral angiopathy with gangrene, unspecified whether long term insulin use ([place]) Continue hypoglycemic medications as already ordered, these medications have been reviewed and there are no changes at this time.  Hgb A1C to be monitored as already arranged by primary service    Current Outpatient Medications on File Prior to Visit  Medication Sig Dispense Refill  . amiodarone (PACERONE) 200 MG tablet Take 1 tablet (200 mg total) by mouth daily. 90 tablet 3  . aspirin EC  81 MG tablet Take 81 mg by mouth daily.    Marland Kitchen b complex-vitamin c-folic acid (NEPHRO-VITE) 0.8 MG TABS tablet Take 1 tablet by mouth daily.    . cinacalcet (SENSIPAR) 60 MG tablet Take 60 mg by mouth daily.     . ferrous sulfate 325 (65 FE) MG EC tablet Take 325 mg by mouth 3 (three) times daily with meals.    Marland Kitchen glipiZIDE (GLUCOTROL) 10 MG tablet Take 10 mg by mouth daily.     Marland Kitchen lactulose (CEPHULAC) 20 g packet Take 1 packet (20 g total) by mouth 3 (three) times daily. (Patient taking differently: Take 20 g by mouth daily as needed. ) 30 each 0  . lidocaine-prilocaine (EMLA) cream Apply 1 application topically as needed (port access).    . midodrine (PROAMATINE) 10 MG tablet Take 1 tablet (10 mg total) by mouth 3 (three) times daily with meals. 30 tablet 0  . Nutritional Supplements (FEEDING SUPPLEMENT, NEPRO CARB STEADY,) LIQD Take 237 mLs by mouth 2 (two) times daily between meals.  0  . omeprazole (PRILOSEC) 40 MG capsule Take 40 mg by mouth 2 (two) times daily.     . sevelamer carbonate (RENVELA) 800 MG tablet Take 2,400 mg by mouth 3 (three) times daily with meals.     Marland Kitchen acetaminophen (TYLENOL) 325 MG tablet Take 650 mg by mouth every 6 (six) hours as needed.    Marland Kitchen albuterol (ACCUNEB) 0.63 MG/3ML nebulizer solution Take 3 mLs (0.63 mg total) by nebulization every 4 (four) hours as needed for wheezing. (Patient not taking: Reported on 12/18/2018) 200 mL 3  . cephALEXin (KEFLEX) 500 MG capsule Take 1 capsule (500 mg total) by mouth 2 (two) times daily. (Patient not taking: Reported on 02/24/2019) 28 capsule 0  . cephALEXin (KEFLEX) 500 MG capsule Take 1 capsule (500 mg total) by mouth 3 (three) times daily. (Patient not taking: Reported on 02/24/2019) 21 capsule 0  . gabapentin (NEURONTIN) 100 MG capsule Take 1 capsule (100  mg total) by mouth 3 (three) times daily. (Patient not taking: Reported on 02/24/2019) 90 capsule 0  . ondansetron (ZOFRAN) 8 MG tablet Take 8 mg by mouth every 8 (eight) hours as  needed.     Marland Kitchen oxyCODONE (OXY IR/ROXICODONE) 5 MG immediate release tablet Take 1 tablet (5 mg total) by mouth every 6 (six) hours as needed for moderate pain or severe pain. (Patient not taking: Reported on 07/02/2019) 20 tablet 0  . polyethylene glycol (MIRALAX / GLYCOLAX) packet Take 17 g by mouth 2 (two) times daily.     . silver sulfADIAZINE (SILVADENE) 1 % cream Apply 1 application topically daily. (Patient not taking: Reported on 02/24/2019) 400 g 0  . Wound Dressings (AQUACEL EXTRA HYDROFIBER 6X6) PADS Apply 1 Package topically 3 (three) times a week. (Patient not taking: Reported on 07/02/2019) 1 each 3   No current facility-administered medications on file prior to visit.     There are no Patient Instructions on file for this visit. No follow-ups on file.   Kris Hartmann, NP  This note was completed with Sales executive.  Any errors are purely unintentional.

## 2019-07-13 ENCOUNTER — Other Ambulatory Visit
Admission: RE | Admit: 2019-07-13 | Discharge: 2019-07-13 | Disposition: A | Discharge status: Home / Self Care | Payer: Medicare Other | Source: Ambulatory Visit | Attending: Vascular Surgery | Admitting: Vascular Surgery

## 2019-07-13 ENCOUNTER — Other Ambulatory Visit: Payer: Self-pay

## 2019-07-13 DIAGNOSIS — Z01812 Encounter for preprocedural laboratory examination: Secondary | ICD-10-CM | POA: Diagnosis present

## 2019-07-13 DIAGNOSIS — Z20828 Contact with and (suspected) exposure to other viral communicable diseases: Secondary | ICD-10-CM | POA: Diagnosis not present

## 2019-07-14 LAB — SARS CORONAVIRUS 2 (TAT 6-24 HRS): SARS Coronavirus 2: NEGATIVE

## 2019-07-15 ENCOUNTER — Other Ambulatory Visit (INDEPENDENT_AMBULATORY_CARE_PROVIDER_SITE_OTHER): Payer: Self-pay | Admitting: Nurse Practitioner

## 2019-07-16 ENCOUNTER — Ambulatory Visit
Admission: RE | Admit: 2019-07-16 | Discharge: 2019-07-16 | Disposition: A | Discharge status: Home / Self Care | Payer: Medicare Other | Source: Ambulatory Visit | Attending: Vascular Surgery | Admitting: Vascular Surgery

## 2019-07-16 ENCOUNTER — Encounter
Admission: RE | Disposition: A | Discharge status: Home / Self Care | Payer: Self-pay | Source: Ambulatory Visit | Attending: Vascular Surgery

## 2019-07-16 ENCOUNTER — Other Ambulatory Visit: Payer: Self-pay

## 2019-07-16 DIAGNOSIS — Z992 Dependence on renal dialysis: Secondary | ICD-10-CM | POA: Diagnosis not present

## 2019-07-16 DIAGNOSIS — I132 Hypertensive heart and chronic kidney disease with heart failure and with stage 5 chronic kidney disease, or end stage renal disease: Secondary | ICD-10-CM | POA: Insufficient documentation

## 2019-07-16 DIAGNOSIS — I480 Paroxysmal atrial fibrillation: Secondary | ICD-10-CM | POA: Insufficient documentation

## 2019-07-16 DIAGNOSIS — D631 Anemia in chronic kidney disease: Secondary | ICD-10-CM | POA: Insufficient documentation

## 2019-07-16 DIAGNOSIS — N186 End stage renal disease: Secondary | ICD-10-CM | POA: Insufficient documentation

## 2019-07-16 DIAGNOSIS — Z89611 Acquired absence of right leg above knee: Secondary | ICD-10-CM | POA: Diagnosis not present

## 2019-07-16 DIAGNOSIS — Z89612 Acquired absence of left leg above knee: Secondary | ICD-10-CM | POA: Insufficient documentation

## 2019-07-16 DIAGNOSIS — I2510 Atherosclerotic heart disease of native coronary artery without angina pectoris: Secondary | ICD-10-CM | POA: Diagnosis not present

## 2019-07-16 DIAGNOSIS — E785 Hyperlipidemia, unspecified: Secondary | ICD-10-CM | POA: Diagnosis not present

## 2019-07-16 DIAGNOSIS — T82858A Stenosis of vascular prosthetic devices, implants and grafts, initial encounter: Secondary | ICD-10-CM | POA: Insufficient documentation

## 2019-07-16 DIAGNOSIS — Z7984 Long term (current) use of oral hypoglycemic drugs: Secondary | ICD-10-CM | POA: Diagnosis not present

## 2019-07-16 DIAGNOSIS — E7800 Pure hypercholesterolemia, unspecified: Secondary | ICD-10-CM | POA: Diagnosis not present

## 2019-07-16 DIAGNOSIS — Z7982 Long term (current) use of aspirin: Secondary | ICD-10-CM | POA: Diagnosis not present

## 2019-07-16 DIAGNOSIS — E1122 Type 2 diabetes mellitus with diabetic chronic kidney disease: Secondary | ICD-10-CM | POA: Diagnosis not present

## 2019-07-16 DIAGNOSIS — Z79899 Other long term (current) drug therapy: Secondary | ICD-10-CM | POA: Insufficient documentation

## 2019-07-16 DIAGNOSIS — I509 Heart failure, unspecified: Secondary | ICD-10-CM | POA: Diagnosis not present

## 2019-07-16 DIAGNOSIS — I771 Stricture of artery: Secondary | ICD-10-CM

## 2019-07-16 DIAGNOSIS — Z8673 Personal history of transient ischemic attack (TIA), and cerebral infarction without residual deficits: Secondary | ICD-10-CM | POA: Insufficient documentation

## 2019-07-16 DIAGNOSIS — E1151 Type 2 diabetes mellitus with diabetic peripheral angiopathy without gangrene: Secondary | ICD-10-CM | POA: Diagnosis not present

## 2019-07-16 DIAGNOSIS — Y841 Kidney dialysis as the cause of abnormal reaction of the patient, or of later complication, without mention of misadventure at the time of the procedure: Secondary | ICD-10-CM | POA: Diagnosis not present

## 2019-07-16 HISTORY — PX: A/V FISTULAGRAM: CATH118298

## 2019-07-16 LAB — GLUCOSE, CAPILLARY
Glucose-Capillary: 137 mg/dL — ABNORMAL HIGH (ref 70–99)
Glucose-Capillary: 127 mg/dL — ABNORMAL HIGH (ref 70–99)

## 2019-07-16 LAB — POTASSIUM (ARMC VASCULAR LAB ONLY): Potassium (ARMC vascular lab): 4.8 (ref 3.5–5.1)

## 2019-07-16 SURGERY — A/V FISTULAGRAM
Anesthesia: Moderate Sedation | Laterality: Left

## 2019-07-16 MED ORDER — FAMOTIDINE 20 MG PO TABS: 40.0000 mg | ORAL_TABLET | Freq: Once | ORAL | Status: DC | PRN

## 2019-07-16 MED ORDER — SODIUM CHLORIDE 0.9 % IV BOLUS
INTRAVENOUS | Status: AC | PRN
  Administered 2019-07-16: 250 mL via INTRAVENOUS

## 2019-07-16 MED ORDER — CEFAZOLIN SODIUM-DEXTROSE 1-4 GM/50ML-% IV SOLN: 1.0000 g | Freq: Once | INTRAVENOUS | Status: DC

## 2019-07-16 MED ORDER — HYDROMORPHONE HCL 1 MG/ML IJ SOLN: 1.0000 mg | Freq: Once | INTRAMUSCULAR | Status: DC | PRN

## 2019-07-16 MED ORDER — SODIUM CHLORIDE 0.9 % IV SOLN
INTRAVENOUS | Status: DC
  Administered 2019-07-16: 08:00:00 via INTRAVENOUS

## 2019-07-16 MED ORDER — ONDANSETRON HCL 4 MG/2ML IJ SOLN: 4.0000 mg | Freq: Four times a day (QID) | INTRAMUSCULAR | Status: DC | PRN

## 2019-07-16 MED ORDER — FENTANYL CITRATE (PF) 100 MCG/2ML IJ SOLN
INTRAMUSCULAR | Status: DC | PRN
  Administered 2019-07-16: 50 ug via INTRAVENOUS

## 2019-07-16 MED ORDER — MIDAZOLAM HCL 5 MG/5ML IJ SOLN
INTRAMUSCULAR | Status: AC
  Filled 2019-07-16: qty 5

## 2019-07-16 MED ORDER — FENTANYL CITRATE (PF) 100 MCG/2ML IJ SOLN
INTRAMUSCULAR | Status: AC
  Filled 2019-07-16: qty 2

## 2019-07-16 MED ORDER — DIPHENHYDRAMINE HCL 50 MG/ML IJ SOLN: 50.0000 mg | Freq: Once | INTRAMUSCULAR | Status: DC | PRN

## 2019-07-16 MED ORDER — METHYLPREDNISOLONE SODIUM SUCC 125 MG IJ SOLR: 125.0000 mg | Freq: Once | INTRAMUSCULAR | Status: DC | PRN

## 2019-07-16 MED ORDER — HEPARIN SODIUM (PORCINE) 1000 UNIT/ML IJ SOLN
INTRAMUSCULAR | Status: AC
  Filled 2019-07-16: qty 1

## 2019-07-16 MED ORDER — MIDAZOLAM HCL 2 MG/ML PO SYRP: 8.0000 mg | ORAL_SOLUTION | Freq: Once | ORAL | Status: DC | PRN

## 2019-07-16 MED ORDER — IODIXANOL 320 MG/ML IV SOLN
INTRAVENOUS | Status: DC | PRN
  Administered 2019-07-16: 35 mL

## 2019-07-16 MED ORDER — HEPARIN SODIUM (PORCINE) 1000 UNIT/ML IJ SOLN
INTRAMUSCULAR | Status: DC | PRN
  Administered 2019-07-16: 3000 [IU] via INTRAVENOUS

## 2019-07-16 MED ORDER — MIDAZOLAM HCL 2 MG/2ML IJ SOLN
INTRAMUSCULAR | Status: DC | PRN
  Administered 2019-07-16: 2 mg via INTRAVENOUS

## 2019-07-16 SURGICAL SUPPLY — 19 items
BALLN LUTONIX 018 4X150X130 (BALLOONS) ×3
BALLN LUTONIX DCB 6X40X130 (BALLOONS) ×3
BALLN ULTRV 018 4X60X75 (BALLOONS) ×3
BALLOON LUTONIX 018 4X150X130 (BALLOONS) ×1 IMPLANT
BALLOON LUTONIX DCB 6X40X130 (BALLOONS) ×1 IMPLANT
BALLOON ULTRV 018 4X60X75 (BALLOONS) ×1 IMPLANT
CANNULA 5F STIFF (CANNULA) ×3 IMPLANT
CATH BEACON 5 .035 40 KMP TP (CATHETERS) ×1 IMPLANT
CATH BEACON 5 .038 40 KMP TP (CATHETERS) ×2
COVER PROBE U/S 5X48 (MISCELLANEOUS) ×3 IMPLANT
DEVICE PRESTO INFLATION (MISCELLANEOUS) ×3 IMPLANT
DEVICE TORQUE .025-.038 (MISCELLANEOUS) ×3 IMPLANT
DRAPE BRACHIAL (DRAPES) ×3 IMPLANT
GLIDEWIRE STIFF .35X180X3 HYDR (WIRE) ×3 IMPLANT
PACK ANGIOGRAPHY (CUSTOM PROCEDURE TRAY) ×3 IMPLANT
SHEATH BRITE TIP 6FRX5.5 (SHEATH) ×3 IMPLANT
SUT MNCRL AB 4-0 PS2 18 (SUTURE) ×3 IMPLANT
WIRE G 018X200 V18 (WIRE) ×3 IMPLANT
WIRE MAGIC TOR.035 180C (WIRE) ×3 IMPLANT

## 2019-07-16 NOTE — Discharge Instructions (Signed)
Fistulografa de dilisis, cuidados posteriores Dialysis Fistulogram, Care After Esta hoja le proporciona informacin sobre cmo cuidarse despus del procedimiento. Su mdico tambin podr darle instrucciones ms especficas. Comunquese con su mdico si tiene problemas o preguntas. Qu puedo esperar despus del procedimiento? Despus del procedimiento, es comn [facility] siguientes sntomas:  Una pequea molestia en la zona en la que se coloc el tubo delgado y pequeo (catter) para el procedimiento.  Un pequeo hematoma alrededor de la fstula.  Somnolencia y cansancio Company secretary). Siga estas indicaciones en su casa: Actividad   Haga reposo en su casa y no levante objetos que pesen ms de 5 lb (2,3 kg) el da despus del procedimiento.  Reanude sus actividades normales segn lo indicado por el mdico. Pregntele al mdico qu actividades son seguras para usted.  No conduzca ni use maquinaria pesada mientras toma analgsicos recetados.  No conduzca durante 24horas si recibi un medicamento para ayudarlo a relajarse (sedante) durante el procedimiento. Medicamentos   [employer] de venta libre y los recetados solamente como se lo haya indicado el mdico. Cuidado del Environmental consultant de la puncin  Siga las indicaciones de su mdico acerca de cmo Government social research officer donde se insertaron los catteres. Asegrese de hacer lo siguiente: ? Lvese las manos con agua y jabn antes de [place]ar[place] manager las vendas (vendaje). Use desinfectante para manos si no dispone de [place]. ? Cambie el vendaje como se lo haya indicado el mdico. ? No retire los puntos (suturas), la goma para cerrar la piel o las tiras [place]. Es posible que estos cierres cutneos Animal nutritionist en la piel durante 2semanas o ms. Si los bordes de las tiras adhesivas empiezan a despegarse y Therapist, sports, puede recortar los que estn sueltos. No retire las tiras [facility] por completo a menos que el mdico se lo indique.  Controle  la zona de la puncin todos los das para detectar signos de infeccin. Est atento a los siguientes signos: ? Dolor, hinchazn o enrojecimiento. ? Lquido o sangre. ? Calor. ? Pus o mal olor. Instrucciones generales  No tome baos de inmersin, no nade ni use el jacuzzi hasta que el mdico lo autorice. Pregntele al mdico si puede ducharse. Thurston Pounds solo le permitan darse baos de [place].  Controle atentamente la fstula de dilisis. Verifique para asegurarse de que puede sentir una vibracin o un zumbido (frmito) cuando Risk manager los dedos sobre la fstula.  Evite daar el injerto o la fstula: ? No use ropa ajustada ni joyas en el brazo o la pierna que tiene el injerto o la fstula. ? Informe a todos sus mdicos que tiene un injerto o una fstula para dilisis. ? No permita extracciones de sangre, terapias intravenosas ni lecturas de la presin arterial en el brazo que tiene la fstula o el injerto. ? No permita que le apliquen vacunas antigripales ni otras vacunas en el brazo con la fstula o el injerto.  Concurra a todas las visitas de seguimiento como se lo haya indicado el mdico. Esto es importante. Comunquese con un mdico si:  Tiene enrojecimiento, hinchazn o dolor en el lugar donde se insert el catter.  Observa lquido o sangre que proviene del lugar de la insercin del catter.  El lugar de la insercin del catter est caliente al tacto.  Tiene pus o percibe mal olor que proviene del lugar de la insercin del catter.  Tiene fiebre o siente escalofros. Solicite ayuda de inmediato si:  Se siente dbil.  Tiene problemas de equilibrio.  Tiene dificultad para mover los brazos o las piernas. °· Tiene problemas visuales o para hablar. °· Ya no puede sentir una vibración o un zumbido cuando coloca los dedos sobre la fístula de diálisis. °· La extremidad que se usó para el procedimiento: °? Se hincha. °? Duele. °? Está fría. °? Cambia de color, por ejemplo, se torna  azulada o blanco pálido. °· Siente falta de aire o dolor en el pecho. °Resumen °· Después de una fistulografía de diálisis, es común tener una pequeña molestia o algunos moretones en la zona donde se colocó el tubo delgado y pequeño (catéter). °· Descanse en su casa el día después del procedimiento. Reanude sus actividades normales según lo indicado por el médico. °· Tome los medicamentos de venta libre y los recetados solamente como se lo haya indicado el médico. °· Siga las indicaciones de su médico acerca de cómo cuidar el lugar donde se insertó el catéter. °· Concurra a todas las visitas de seguimiento como se lo haya indicado el médico. °Esta información no tiene como fin reemplazar el consejo del médico. Asegúrese de hacerle al médico cualquier pregunta que tenga. °Document Released: 02/15/2014 Document Revised: 12/04/2017 Document Reviewed: 12/04/2017 °Elsevier Patient Education © 2020 Elsevier Inc. ° °Sedación consciente moderada en los adultos, cuidados posteriores °(Moderate Conscious Sedation, Adult, Care After) °Estas indicaciones le proporcionan información acerca de cómo deberá cuidarse después del procedimiento. El médico también podrá darle instrucciones más específicas. El tratamiento ha sido planificado según las prácticas médicas actuales, pero en algunos casos pueden ocurrir problemas. Comuníquese con el médico si tiene algún problema o dudas después del procedimiento. °QUÉ ESPERAR DESPUÉS DEL PROCEDIMIENTO °Después del procedimiento, es común: °· Sentirse somnoliento durante varias horas. °· Sentirse torpe y tener problemas de equilibrio durante varias horas. °· Perder el sentido de la realidad durante varias horas. °· Vomitar si come muy pronto. °INSTRUCCIONES PARA EL CUIDADO EN EL HOGAR ° °Durante al menos 24 horas después del procedimiento: °· No haga lo siguiente: °? Participar en actividades que impliquen posibles caídas o lesiones. °? Conducir vehículos. °? Operar maquinarias  pesadas. °? Beber alcohol. °? Tomar somníferos o medicamentos que causen somnolencia. °? Firmar documentos legales ni tomar decisiones importantes. °? Cuidar a niños por su cuenta. °· Hacer reposo. °Comida y bebida °· Siga la dieta recomendada por el médico. °· Si vomita: °? Pruebe agua, jugo o sopa cuando usted pueda beber sin vomitar. °? Asegúrese de no tener náuseas antes de ingerir alimentos sólidos. °Instrucciones generales °· Permanezca con un adulto responsable hasta que esté completamente despierto y consciente. °· Tome los medicamentos de venta libre y los recetados solamente como se lo haya indicado el médico. °· Si fuma, no lo haga sin supervisión. °· Concurra a todas las visitas de control como se lo haya indicado el médico. Esto es importante. °SOLICITE ATENCIÓN MÉDICA SI: °· Sigue teniendo náuseas o vomitando. °· Tiene sensación de desvanecimiento. °· Le aparece una erupción cutánea. °· Tiene fiebre. °SOLICITE ATENCIÓN MÉDICA DE INMEDIATO SI: °· Tiene dificultad para respirar. °Esta información no tiene como fin reemplazar el consejo del médico. Asegúrese de hacerle al médico cualquier pregunta que tenga. °Document Released: 10/06/2013 Document Revised: 06/07/2016 Document Reviewed: 01/21/2016 °Elsevier Patient Education © 2020 Elsevier Inc. ° °

## 2019-07-16 NOTE — Op Note (Signed)
[facility]    OPERATIVE NOTE   PROCEDURE: 1.   Left radiocephalic arteriovenous fistula cannulation under ultrasound guidance 2.   Left arm fistulagram including central venogram 3.   Catheter placement into left brachial artery through the fistula in a retrograde fashion and left upper extremity angiogram 4.   Percutaneous transluminal angioplasty of the cephalic vein just beyond the radiocephalic anastomosis with 6 mm diameter by 4 cm Lutonix drug-coated angioplasty balloon 5.   Percutaneous transluminal angioplasty of the left radial artery with 4 mm diameter by 15 cm length Lutonix drug-coated angioplasty balloon  PRE-OPERATIVE DIAGNOSIS: 1. ESRD 2. Poorly functional left radiocephalic AVF  POST-OPERATIVE DIAGNOSIS: same as above   SURGEON: Leotis Pain, MD  ANESTHESIA: local with MCS  ESTIMATED BLOOD LOSS: 3 cc  FINDING(S): 1. The left radiocephalic AV fistula had a nearly occlusive greater than 90% stenosis in the cephalic vein about 2 cm beyond the radiocephalic anastomosis.  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  There was dual outflow in the upper arm through both the cephalic and the basilic veins without focal stenosis in the upper arm.  The central venous circulation appeared to be patent although opacification was poor  SPECIMEN(S):  None  CONTRAST: 35 cc  FLUORO TIME: 8.8 minutes  MODERATE CONSCIOUS SEDATION TIME: Approximately 30 minutes with 2 mg of Versed and 50 mcg of Fentanyl   INDICATIONS: [name] is a 58 y.o. male who presents with malfunctioning left radiocephalic arteriovenous fistula.  The patient is scheduled for left arm fistulagram.  The patient is aware the risks include but are not limited to: bleeding, infection, thrombosis of the cannulated access, and possible anaphylactic reaction to the contrast.  The patient is aware  of the risks of the procedure and elects to proceed forward.  DESCRIPTION: After full informed written consent was obtained, the patient was brought back to the angiography suite and placed supine upon the angiography table.  The patient was connected to monitoring equipment. Moderate conscious sedation was administered with a face to face encounter with the patient throughout the procedure with my supervision of the RN administering medicines and monitoring the patient's vital signs and mental status throughout from the start of the procedure until the patient was taken to the recovery room. The left arm was prepped and draped in the standard fashion for a percutaneous access intervention.  Under ultrasound guidance, the proximal forearm cephalic vein portion of the left radiocephalic arteriovenous fistula was cannulated with a micropuncture needle under direct ultrasound guidance where it was patent and a permanent image was performed.  The microwire was advanced into the fistula and the needle was exchanged for the a microsheath.  I then upsized to a 6 Fr Sheath and imaging was performed.  Hand injections were completed to image the access including the central venous system. This demonstrated that the left radiocephalic AV fistula had a nearly occlusive greater than 90% stenosis in the cephalic vein about 2 cm beyond the radiocephalic anastomosis.  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  There was dual outflow in the upper arm through both the cephalic and the basilic veins without focal stenosis in the upper arm.  The central venous circulation appeared to be patent although opacification was poor.  In addition, there appeared  to be sluggish inflow and he has severe arterial disease in multiple locations previously so I elected to evaluate his arterial flow.  Using a Glidewire and a Kumpe catheter I was  able to gain access across the anastomosis into the radial artery and advanced up to the brachial artery.  Selective imaging of the left upper extremity was then performed showing a normal brachial bifurcation with mild to moderate disease within the ulnar artery which was large and dominant to the hand.  The radial artery was more diffusely diseased with the midportion of the radial artery 8 to 10 cm proximal to the anastomosis demonstrating about a 70 to 75% stenosis and several other areas have been stenoses approaching 60%.  Based on the images, this patient will need intervention to both the arterial inflow and at the cephalic vein outflow. I then gave the patient 3000 units of intravenous heparin.  I then placed a V 18 wire through the Kumpe catheter and remove the Kumpe catheter.  The wire was parked into the mid brachial artery to gain access to both lesions to treat.  Based on the imaging, a 6 mm x 4 cm Lutonix drug-coated angioplasty balloon was selected.  The balloon was centered around the cephalic vein stenosis just beyond the anastomosis and inflated to 14 ATM for 1 minute(s).  On completion imaging, a 10-15% residual stenosis was present.   I then turned my attention to the arterial inflow stenosis.  Initially, 4 mm diameter by 15 cm length Lutonix drug-coated angioplasty balloon was selected but it would not easily cross the lesion.  A regular 4 mm diameter by 6 cm length angioplasty balloon was used to treat the radial artery from the proximal radial artery to the midportion.  The 4 mm diameter by 15 cm likely tonics drug-coated angioplasty balloon was then used to treat from the midportion of the radial artery back to the perianastomotic radial artery.  These inflations were 10 to 12 atm for 1 minute.  On completion imaging markedly improved flow with only about a 20 to 25% residual stenosis was present.   Based on the completion imaging, no further intervention is necessary.  The wire and  balloon were removed from the sheath.  A 4-0 Monocryl purse-string suture was sewn around the sheath.  The sheath was removed while tying down the suture.  A sterile bandage was applied to the puncture site.  COMPLICATIONS: None  CONDITION: Stable   Leotis Pain  07/16/2019 10:28 AM   This note was created with Dragon Medical transcription system. Any errors in dictation are purely unintentional.

## 2019-07-16 NOTE — H&P (Signed)
[facility] History & Physical Update  The patient was interviewed and re-examined.  The patient's previous History and Physical has been reviewed and is unchanged.  There is no change in the plan of care. We plan to proceed with the scheduled procedure.  Leotis Pain, MD  07/16/2019, 8:09 AM

## 2019-07-17 ENCOUNTER — Encounter: Payer: Self-pay | Admitting: Vascular Surgery

## 2019-07-22 ENCOUNTER — Other Ambulatory Visit (INDEPENDENT_AMBULATORY_CARE_PROVIDER_SITE_OTHER): Payer: Self-pay | Admitting: Nurse Practitioner

## 2019-07-22 ENCOUNTER — Other Ambulatory Visit: Payer: Self-pay

## 2019-07-22 ENCOUNTER — Encounter
Admission: RE | Disposition: A | Discharge status: Home / Self Care | Payer: Self-pay | Source: Ambulatory Visit | Attending: Vascular Surgery

## 2019-07-22 ENCOUNTER — Ambulatory Visit
Admission: RE | Admit: 2019-07-22 | Discharge: 2019-07-22 | Disposition: A | Discharge status: Home / Self Care | Payer: Medicare Other | Source: Ambulatory Visit | Attending: Vascular Surgery | Admitting: Vascular Surgery

## 2019-07-22 ENCOUNTER — Encounter: Payer: Self-pay | Admitting: *Deleted

## 2019-07-22 ENCOUNTER — Telehealth (INDEPENDENT_AMBULATORY_CARE_PROVIDER_SITE_OTHER): Payer: Self-pay | Admitting: Vascular Surgery

## 2019-07-22 DIAGNOSIS — I132 Hypertensive heart and chronic kidney disease with heart failure and with stage 5 chronic kidney disease, or end stage renal disease: Secondary | ICD-10-CM | POA: Diagnosis not present

## 2019-07-22 DIAGNOSIS — Z992 Dependence on renal dialysis: Secondary | ICD-10-CM | POA: Insufficient documentation

## 2019-07-22 DIAGNOSIS — Z8673 Personal history of transient ischemic attack (TIA), and cerebral infarction without residual deficits: Secondary | ICD-10-CM | POA: Diagnosis not present

## 2019-07-22 DIAGNOSIS — N186 End stage renal disease: Secondary | ICD-10-CM | POA: Insufficient documentation

## 2019-07-22 DIAGNOSIS — Z20828 Contact with and (suspected) exposure to other viral communicable diseases: Secondary | ICD-10-CM | POA: Insufficient documentation

## 2019-07-22 DIAGNOSIS — E1151 Type 2 diabetes mellitus with diabetic peripheral angiopathy without gangrene: Secondary | ICD-10-CM | POA: Diagnosis not present

## 2019-07-22 DIAGNOSIS — E1122 Type 2 diabetes mellitus with diabetic chronic kidney disease: Secondary | ICD-10-CM | POA: Diagnosis not present

## 2019-07-22 DIAGNOSIS — D631 Anemia in chronic kidney disease: Secondary | ICD-10-CM | POA: Insufficient documentation

## 2019-07-22 DIAGNOSIS — E785 Hyperlipidemia, unspecified: Secondary | ICD-10-CM | POA: Insufficient documentation

## 2019-07-22 DIAGNOSIS — I509 Heart failure, unspecified: Secondary | ICD-10-CM | POA: Diagnosis not present

## 2019-07-22 DIAGNOSIS — Z79899 Other long term (current) drug therapy: Secondary | ICD-10-CM | POA: Diagnosis not present

## 2019-07-22 DIAGNOSIS — Z89612 Acquired absence of left leg above knee: Secondary | ICD-10-CM | POA: Diagnosis not present

## 2019-07-22 DIAGNOSIS — E7800 Pure hypercholesterolemia, unspecified: Secondary | ICD-10-CM | POA: Insufficient documentation

## 2019-07-22 DIAGNOSIS — I2510 Atherosclerotic heart disease of native coronary artery without angina pectoris: Secondary | ICD-10-CM | POA: Diagnosis not present

## 2019-07-22 DIAGNOSIS — Y841 Kidney dialysis as the cause of abnormal reaction of the patient, or of later complication, without mention of misadventure at the time of the procedure: Secondary | ICD-10-CM | POA: Insufficient documentation

## 2019-07-22 DIAGNOSIS — Z89611 Acquired absence of right leg above knee: Secondary | ICD-10-CM | POA: Insufficient documentation

## 2019-07-22 DIAGNOSIS — Z7984 Long term (current) use of oral hypoglycemic drugs: Secondary | ICD-10-CM | POA: Diagnosis not present

## 2019-07-22 DIAGNOSIS — Z7982 Long term (current) use of aspirin: Secondary | ICD-10-CM | POA: Diagnosis not present

## 2019-07-22 DIAGNOSIS — T82898A Other specified complication of vascular prosthetic devices, implants and grafts, initial encounter: Secondary | ICD-10-CM | POA: Diagnosis not present

## 2019-07-22 DIAGNOSIS — T82868A Thrombosis of vascular prosthetic devices, implants and grafts, initial encounter: Secondary | ICD-10-CM | POA: Diagnosis not present

## 2019-07-22 DIAGNOSIS — I480 Paroxysmal atrial fibrillation: Secondary | ICD-10-CM | POA: Diagnosis not present

## 2019-07-22 HISTORY — PX: PERIPHERAL VASCULAR THROMBECTOMY: CATH118306

## 2019-07-22 LAB — POTASSIUM (ARMC VASCULAR LAB ONLY): Potassium (ARMC vascular lab): 5 (ref 3.5–5.1)

## 2019-07-22 LAB — GLUCOSE, CAPILLARY
Glucose-Capillary: 147 mg/dL — ABNORMAL HIGH (ref 70–99)
Glucose-Capillary: 122 mg/dL — ABNORMAL HIGH (ref 70–99)

## 2019-07-22 LAB — SARS CORONAVIRUS 2 BY RT PCR (HOSPITAL ORDER, PERFORMED IN ~~LOC~~ HOSPITAL LAB): SARS Coronavirus 2: NEGATIVE

## 2019-07-22 SURGERY — PERIPHERAL VASCULAR THROMBECTOMY
Anesthesia: Moderate Sedation | Laterality: Left

## 2019-07-22 MED ORDER — METHYLPREDNISOLONE SODIUM SUCC 125 MG IJ SOLR: 125.0000 mg | Freq: Once | INTRAMUSCULAR | Status: DC | PRN

## 2019-07-22 MED ORDER — SODIUM CHLORIDE 0.9 % IV SOLN
INTRAVENOUS | Status: DC
  Administered 2019-07-22: 14:00:00 via INTRAVENOUS

## 2019-07-22 MED ORDER — ONDANSETRON HCL 4 MG/2ML IJ SOLN: 4.0000 mg | Freq: Four times a day (QID) | INTRAMUSCULAR | Status: DC | PRN

## 2019-07-22 MED ORDER — DIPHENHYDRAMINE HCL 50 MG/ML IJ SOLN: 50.0000 mg | Freq: Once | INTRAMUSCULAR | Status: DC | PRN

## 2019-07-22 MED ORDER — MIDAZOLAM HCL 2 MG/2ML IJ SOLN
INTRAMUSCULAR | Status: DC | PRN
  Administered 2019-07-22: 2 mg via INTRAVENOUS

## 2019-07-22 MED ORDER — MIDAZOLAM HCL 5 MG/5ML IJ SOLN
INTRAMUSCULAR | Status: AC
  Filled 2019-07-22: qty 5

## 2019-07-22 MED ORDER — FENTANYL CITRATE (PF) 100 MCG/2ML IJ SOLN
INTRAMUSCULAR | Status: AC
  Filled 2019-07-22: qty 2

## 2019-07-22 MED ORDER — FENTANYL CITRATE (PF) 100 MCG/2ML IJ SOLN
INTRAMUSCULAR | Status: DC | PRN
  Administered 2019-07-22: 25 ug via INTRAVENOUS

## 2019-07-22 MED ORDER — CEFAZOLIN SODIUM-DEXTROSE 1-4 GM/50ML-% IV SOLN
1.0000 g | Freq: Once | INTRAVENOUS | Status: AC
  Administered 2019-07-22: 16:00:00 1 g via INTRAVENOUS

## 2019-07-22 MED ORDER — MIDAZOLAM HCL 2 MG/ML PO SYRP: 8.0000 mg | ORAL_SOLUTION | Freq: Once | ORAL | Status: DC | PRN

## 2019-07-22 MED ORDER — FAMOTIDINE 20 MG PO TABS: 40.0000 mg | ORAL_TABLET | Freq: Once | ORAL | Status: DC | PRN

## 2019-07-22 MED ORDER — HYDROMORPHONE HCL 1 MG/ML IJ SOLN: 1.0000 mg | Freq: Once | INTRAMUSCULAR | Status: DC | PRN

## 2019-07-22 SURGICAL SUPPLY — 9 items
CATH CANNON HEMO 15FR 19 (HEMODIALYSIS SUPPLIES) ×3 IMPLANT
DERMABOND ADVANCED (GAUZE/BANDAGES/DRESSINGS) ×2
DERMABOND ADVANCED .7 DNX12 (GAUZE/BANDAGES/DRESSINGS) ×1 IMPLANT
DRAPE INCISE IOBAN 66X45 STRL (DRAPES) ×3 IMPLANT
NEEDLE ENTRY 21GA 7CM ECHOTIP (NEEDLE) ×3 IMPLANT
PACK ANGIOGRAPHY (CUSTOM PROCEDURE TRAY) ×3 IMPLANT
SET INTRO CAPELLA COAXIAL (SET/KITS/TRAYS/PACK) ×3 IMPLANT
SUT MNCRL AB 4-0 PS2 18 (SUTURE) ×3 IMPLANT
SUT SILK 0 FSL (SUTURE) ×3 IMPLANT

## 2019-07-22 NOTE — Discharge Instructions (Signed)
Moderate Conscious Sedation, Adult, Care After °These instructions provide you with information about caring for yourself after your procedure. Your health care provider may also give you more specific instructions. Your treatment has been planned according to current medical practices, but problems sometimes occur. Call your health care provider if you have any problems or questions after your procedure. °What can I expect after the procedure? °After your procedure, it is common: °· To feel sleepy for several hours. °· To feel clumsy and have poor balance for several hours. °· To have poor judgment for several hours. °· To vomit if you eat too soon. °Follow these instructions at home: °For at least 24 hours after the procedure: ° °· Do not: °? Participate in activities where you could fall or become injured. °? Drive. °? Use heavy machinery. °? Drink alcohol. °? Take sleeping pills or medicines that cause drowsiness. °? Make important decisions or sign legal documents. °? Take care of children on your own. °· Rest. °Eating and drinking °· Follow the diet recommended by your health care provider. °· If you vomit: °? Drink water, juice, or soup when you can drink without vomiting. °? Make sure you have little or no nausea before eating solid foods. °General instructions °· Have a responsible adult stay with you until you are awake and alert. °· Take over-the-counter and prescription medicines only as told by your health care provider. °· If you smoke, do not smoke without supervision. °· Keep all follow-up visits as told by your health care provider. This is important. °Contact a health care provider if: °· You keep feeling nauseous or you keep vomiting. °· You feel light-headed. °· You develop a rash. °· You have a fever. °Get help right away if: °· You have trouble breathing. °This information is not intended to replace advice given to you by your health care provider. Make sure you discuss any questions you have  with your health care provider. °Document Released: 07/22/2013 Document Revised: 09/13/2017 Document Reviewed: 01/21/2016 °Elsevier Patient Education © 2020 [employer] °Tunneled Catheter Insertion, Care After °This sheet gives you information about how to care for yourself after your procedure. Your health care provider may also give you more specific instructions. If you have problems or questions, contact your health care provider. °What can I expect after the procedure? °After the procedure, it is common to have: °· Some mild redness, bruising, swelling, and pain around your catheter site. °· A small amount of blood or clear fluid coming from your incisions. °Follow these instructions at home: °Incision care ° °· Follow instructions from your health care provider about how to take care of your incisions. Make sure you: °? Wash your hands with soap and water before and after you change your bandages (dressings). If soap and water are not available, use hand sanitizer. °? Change your dressings as told by your health care provider. Wash the area around your incisions with a germ-killing (antiseptic) solution when you change your dressings. °? Leave stitches (sutures), skin glue, or adhesive strips in place. These skin closures may need to stay in place for 2 weeks or longer. If adhesive strip edges start to loosen and curl up, you may trim the loose edges. Do not remove adhesive strips completely unless your health care provider tells you to do that. °· Keep your dressings clean and dry. °· Check your incision areas every day for signs of infection. Check for: °? More redness, swelling, or pain. °? More fluid or blood. °?   Warmth. °? Pus or a bad smell. °Catheter care ° °· Wash your hands with soap and water before and after caring for your catheter. If soap and water are not available, use hand sanitizer. °· Keep your catheter site clean and dry. °· Apply an antibiotic ointment to your catheter site as told by  your health care provider. °· Flush your catheter as told by your health care provider. This helps prevent it from becoming clogged. °· Do not open the caps on the ends of the catheter. °· Do not pull on your catheter. °Medicines °· Take over-the-counter and prescription medicines only as told by your health care provider. °· If you were prescribed an antibiotic medicine, take it as told by your health care provider. Do not stop taking the antibiotic even if you start to feel better. °Activity °· Return to your normal activities as told by your health care provider. Ask your health care provider what activities are safe for you. °· Follow any other activity restrictions as instructed by your health care provider. °· Do not lift anything that is heavier than 10 lb (4.5 kg), or the limit that you are told, until your health care provider says that it is safe. °Driving °· Do not drive until your health care provider approves. °· Ask your health care provider if the medicine prescribed to you requires you to avoid driving or using heavy machinery. °General instructions °· Follow your health care provider's specific instructions for the type of catheter that you have. °· Do not take baths, swim, or use a hot tub until your health care provider approves. Ask your health care provider if you may take showers. °· Keep all follow-up visits as told by your health care provider. This is important. °Contact a health care provider if: °· You feel unusually weak or nauseous. °· You have more redness, swelling, or pain at your incisions or around the area where your catheter is inserted. °· Your catheter is not working properly. °· You are unable to flush your catheter. °Get help right away if: °· Your catheter develops a hole or it breaks. °· You have pain or swelling when fluids or medicines are being given through the catheter. °· Fluid is leaking from the catheter, under the dressing, or around the dressing. °· Your catheter  comes loose or gets pulled completely out. If this happens, press on your catheter site firmly with a clean cloth until you can get medical help. °· You have swelling in your shoulder, neck, chest, or face. °· You have chest pain or difficulty breathing. °· You feel dizzy or light-headed. °· You have pus or a bad smell coming from your catheter site. °· You have a fever or chills. °· Your catheter site feels warm to the touch. °· You develop bleeding from your catheter or your insertion site, and your bleeding does not stop. °Summary °· After the procedure, it is common to have mild redness, swelling, and pain around your catheter site. °· Return to your normal activities as told by your health care provider. Ask your health care provider what activities are safe for you. °· Follow your health care provider's specific instructions for the type of catheter that you have. °· Keep your catheter site and your dressings clean and dry. °· Contact a health care provider if your catheter is not working properly. Get help right away if you have chest pain, fever, or difficulty breathing. °This information is not intended to replace advice given   to you by your health care provider. Make sure you discuss any questions you have with your health care provider. °Document Released: 09/17/2012 Document Revised: 09/23/2018 Document Reviewed: 09/23/2018 °Elsevier Patient Education © 2020 Else[employer] Inc. ° °

## 2019-07-22 NOTE — Progress Notes (Signed)
Contacted interpreter services to assist with discharge instructions. Contacted [facility] for transportation per patient's request.

## 2019-07-22 NOTE — Telephone Encounter (Signed)
Patient on the schedule to have a fistula declot with Dr. Delana Meyer at the [facility]. Patient will do his Covid testing today at the [facility].

## 2019-07-22 NOTE — Progress Notes (Signed)
Per patient and patient's wife request, called [facility] to ask them to arrange transport for tomorrow's dialysis session. [facility] Ch[facility] stated that they do not arrange for transport after 1700. Called [facility] next and was able to arrange for pickup for patient at his regular dialysis time.

## 2019-07-22 NOTE — Op Note (Signed)
OPERATIVE NOTE   PROCEDURE: 1. Insertion of tunneled dialysis catheter right IJ approach with ultrasound and fluoroscopic guidance.  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  ANESTHESIA: Conscious sedation was administered under my direct supervision by the interventional radiology RN. IV Versed plus fentanyl were utilized. Continuous ECG, pulse oximetry and blood pressure was monitored throughout the entire procedure. Conscious sedation was for a total of 30 minutes.  ESTIMATED BLOOD LOSS: Minimal cc  CONTRAST USED:  None  FLUOROSCOPY TIME: 1.0 minutes  INDICATIONS:   FRIEND [name] a 58 y.o. y.o. male who presents with patient returns after just 1 week recently undergoing declot with intervention for treatment of his left wrist fistula.  He presented to dialysis today and was found to have recurrent thrombosis..  DESCRIPTION: After obtaining full informed written consent, the patient was positioned supine. The right neck and chest wall was prepped and draped in a sterile fashion. Ultrasound was placed in a sterile sleeve. Ultrasound was utilized to identify the right internal jugular vein which is noted to be echolucent and compressible indicating patency. Image is recorded for the permanent record. Under direct ultrasound visualization a micro-needle is inserted into the vein followed by the micro-wire. Micro-sheath was then advanced and a J wire is inserted without difficulty under fluoroscopic guidance. Small counterincision was made at the wire insertion site. Dilators are passed over the wire and the tunneled dialysis catheter is fed into the central venous system without difficulty.  Under fluoroscopy the catheter tip positioned at the atrial caval junction. The catheter is then approximated to the right chest wall and an exit site selected. 1% lidocaine is  infiltrated in soft tissues at this level small incision is made and the tunneling device is then passed from the exit site to the right neck counterincision. Catheter is then connected to the tunneling device and the catheter was pulled subcutaneously. It is then transected and the hub assembly connected without difficulty. Both lumens aspirate and flush easily. After verification of smooth contour with proper tip position under fluoroscopy the catheter is packed with 5000 units of heparin per lumen.  Catheter secured to the skin of the right chest wall with 0 silk. A sterile dressing is applied with a Biopatch.  COMPLICATIONS: None  CONDITION: Good  Hortencia Pilar [facility]. Office:  [phone]   07/22/2019,4:18 PM

## 2019-07-22 NOTE — H&P (Signed)
[facility] History & Physical Update  The patient was interviewed and re-examined.  His fistula malfunction and thrombosis has recurred otherwise there are no new issues.  The patient's previous History and Physical has been reviewed and is unchanged.  There is no change in the plan of care. We plan to proceed with the scheduled procedure.  Hortencia Pilar, MD  07/22/2019, 4:16 PM

## 2019-07-22 NOTE — Telephone Encounter (Signed)
Please advise. AS, CMA 

## 2019-07-23 ENCOUNTER — Encounter: Payer: Self-pay | Admitting: Vascular Surgery

## 2019-07-23 ENCOUNTER — Telehealth (INDEPENDENT_AMBULATORY_CARE_PROVIDER_SITE_OTHER): Payer: Self-pay

## 2019-07-23 NOTE — Telephone Encounter (Signed)
Stegmayer, Clarene Duke, CMA  Cc: [name], MD        Mickel Baas,   As per Dr. Delana Meyer,   Please place this patient on Dr. Bunnie Domino schedule next week for a left brachial cephalic AV fistula creation. His current access is no longer functional. Will place permcath today. Patient is spanish speaking only.   Dx: ESRD  He has dialysis M-W-F   Thanks,  Kim      Patient is now scheduled with Dr. Lucky Cowboy for 07/30/2019 for surgery and will do his Covid testing and pre-op on 07/28/2019 at 11:45 at the [facility]. The pre-surgical instructions will be faxed to [facility] attn: Mariam Dollar. I did inform Mariam Dollar that I had not spoken to the patient, but he is aware that he will need surgery.

## 2019-07-27 ENCOUNTER — Other Ambulatory Visit (INDEPENDENT_AMBULATORY_CARE_PROVIDER_SITE_OTHER): Payer: Self-pay | Admitting: Nurse Practitioner

## 2019-07-28 ENCOUNTER — Encounter
Admission: RE | Admit: 2019-07-28 | Discharge: 2019-07-28 | Disposition: A | Discharge status: Home / Self Care | Payer: Medicare Other | Source: Ambulatory Visit | Attending: Vascular Surgery | Admitting: Vascular Surgery

## 2019-07-28 ENCOUNTER — Ambulatory Visit: Payer: Medicare Other | Admitting: Physician Assistant

## 2019-07-28 ENCOUNTER — Other Ambulatory Visit: Payer: Self-pay

## 2019-07-28 DIAGNOSIS — Z01818 Encounter for other preprocedural examination: Secondary | ICD-10-CM | POA: Insufficient documentation

## 2019-07-28 DIAGNOSIS — Z20828 Contact with and (suspected) exposure to other viral communicable diseases: Secondary | ICD-10-CM | POA: Insufficient documentation

## 2019-07-28 HISTORY — DX: Acute myocardial infarction, unspecified: I21.9

## 2019-07-28 HISTORY — DX: Gastro-esophageal reflux disease without esophagitis: K21.9

## 2019-07-28 LAB — BASIC METABOLIC PANEL
Anion gap: 15 (ref 5–15)
BUN: 41 mg/dL — ABNORMAL HIGH (ref 6–20)
CO2: 27 mmol/L (ref 22–32)
Calcium: 10 mg/dL (ref 8.9–10.3)
Chloride: 92 mmol/L — ABNORMAL LOW (ref 98–111)
Creatinine, Ser: 7.5 mg/dL — ABNORMAL HIGH (ref 0.61–1.24)
GFR calc Af Amer: 8 mL/min — ABNORMAL LOW (ref >60)
GFR calc non Af Amer: 7 mL/min — ABNORMAL LOW (ref >60)
Glucose, Bld: 300 mg/dL — ABNORMAL HIGH (ref 70–99)
Potassium: 4.6 mmol/L (ref 3.5–5.1)
Sodium: 134 mmol/L — ABNORMAL LOW (ref 135–145)

## 2019-07-28 LAB — CBC WITH DIFFERENTIAL/PLATELET
Abs Immature Granulocytes: 0.06 10*3/uL (ref 0.00–0.07)
Basophils Absolute: 0.1 10*3/uL (ref 0.0–0.1)
Basophils Relative: 1 %
Eosinophils Absolute: 0.4 10*3/uL (ref 0.0–0.5)
Eosinophils Relative: 3 %
HCT: 46.8 % (ref 39.0–52.0)
Hemoglobin: 13.9 g/dL (ref 13.0–17.0)
Immature Granulocytes: 1 %
Lymphocytes Relative: 14 %
Lymphs Abs: 1.7 10*3/uL (ref 0.7–4.0)
MCH: 28.1 pg (ref 26.0–34.0)
MCHC: 29.7 g/dL — ABNORMAL LOW (ref 30.0–36.0)
MCV: 94.5 fL (ref 80.0–100.0)
Monocytes Absolute: 1.2 10*3/uL — ABNORMAL HIGH (ref 0.1–1.0)
Monocytes Relative: 10 %
Neutro Abs: 9 10*3/uL — ABNORMAL HIGH (ref 1.7–7.7)
Neutrophils Relative %: 71 %
Platelets: 244 10*3/uL (ref 150–400)
RBC: 4.95 MIL/uL (ref 4.22–5.81)
RDW: 17.4 % — ABNORMAL HIGH (ref 11.5–15.5)
WBC: 12.6 10*3/uL — ABNORMAL HIGH (ref 4.0–10.5)
nRBC: 0 % (ref 0.0–0.2)

## 2019-07-28 LAB — TYPE AND SCREEN
ABO/RH(D): O POS
Antibody Screen: NEGATIVE

## 2019-07-28 LAB — PROTIME-INR
INR: 1.1 (ref 0.8–1.2)
Prothrombin Time: 13.9 seconds (ref 11.4–15.2)

## 2019-07-28 LAB — SARS CORONAVIRUS 2 (TAT 6-24 HRS): SARS Coronavirus 2: NEGATIVE

## 2019-07-28 LAB — APTT: aPTT: 29 seconds (ref 24–36)

## 2019-07-28 NOTE — Patient Instructions (Addendum)
YSu procedimiento est programado para: 07/30/2019 Thurs Report to 2nd floor medical mall Presntese a: To find out your arrival time please call [phone] between [place] on 07/29/2019 Wed Para saber su hora de llegada por favor llame al ([place]:   Remember: Instructions that are not followed completely may result in serious medical risk, up to and including death,  or upon the discretion of your surgeon and anesthesiologist your surgery may need to be rescheduled.  Recuerde: Las instrucciones que no se siguen completamente Heritage manager en un riesgo de salud grave, incluyendo hasta  la [place] o a discrecin de su cirujano y Environmental health practitioner, su ciruga se puede posponer.   __X_ 1.Do not eat food after midnight the night before your procedure. No    gum chewing or hard candies. You may drink clear liquids up to 2 hours     before you are scheduled to arrive for your surgery- DO not drink clear     Liquids within 2 hours of the start of your surgery.     Clear Liquids include:    water, apple juice without pulp, clear carbohydrate drink such as    Clearfast of Gartorade, Black Coffee or Tea (Do not add anything to coffee or tea).      No coma nada despus de la medianoche de la noche anterior a su    procedimiento. No coma chicles ni caramelos duros. Puede tomar    lquidos claros hasta 2 horas antes de su hora programada de llegada al     hospital para su procedimiento. No tome lquidos claros durante el     transcurso de las 2 horas de su llegada programada al hospital para su     procedimiento, ya que esto puede llevar a que su procedimiento se    retrase o tenga que volver a Health and safety inspector.  Los lquidos claros incluyen:          - Agua o jugo de [place] sin pulpa          - Bebidas claras con carbohidratos como ClearFast o Gatorade          - Caf negro o t claro (sin leche, sin cremas, no agregue nada al caf ni al t)  No tome nada que no  est en esta lista.  Los pacientes con diabetes tipo 1 y tipo 2 solo deben Agricultural engineer.  Llame a la clnica de PreCare o a la unidad de Same Day Surgery si  tiene alguna pregunta sobre estas instrucciones.              _X__ 2.Do Not Smoke or use e-cigarettes For 24 Hours Prior to Your Surgery.    Do not use any chewable tobacco products for at least 6   hours prior to surgery.    No fume ni use cigarrillos electrnicos durante las 24 horas previas    a su [place].  No use ningn producto de tabaco masticable durante   al menos 6 horas antes de la [place].     __X_ 3. No alcohol for 24 hours before or after surgery.    No tome alcohol durante las 24 horas antes ni despus de la [place].   ____4. Bring all medications with you on the day of surgery if instructed.    Lleve todos los medicamentos con usted el da de su ciruga si se le    ha indicado as.   ____ 5. Notify  your doctor if there is any change in your medical condition (cold,fever, infections).    Informe a su mdico si hay algn cambio en su condicin mdica  (resfriado, fiebre, infecciones).   Do not wear jewelry, make-up, hairpins, clips or nail polish.  No use joyas, maquillajes, pinzas/ganchos para el cabello ni esmalte de uas.  Do not wear lotions, powders, or perfumes. You may wear deodorant.  No use lociones, polvos o perfumes.  Puede usar desodorante.    Do not shave 48 hours prior to surgery. Men may shave face and neck.  No se afeite 48 horas antes de la [place].  Los hombres pueden [employer] cara  y el cuello.   Do not bring valuables to the hospital.   No lleve objetos [facility] is not responsible for any belongings or valuables.  Mariano Colon no se hace responsable de ningn tipo de pertenencias u objetos de Geographical information systems officer.               Contacts, dentures or bridgework may not be worn into surgery.  Los lentes de [place], las dentaduras postizas o puentes no se pueden usar en la  [place].   Leave your suitcase in the car. After surgery it may be brought to your room.  Deje su maleta en el auto.  Despus de la ciruga podr traerla a su habitacin.   For patients admitted to the hospital, discharge time is determined by your  treatment team.  Para los pacientes que sean ingresados al hospital, el tiempo en el cual se le  dar de alta es determinado por su equipo de [place].   Patients discharged the day of surgery will not be allowed to drive home. A los pacientes que se les da de alta el mismo da de la ciruga no se les permitir conducir a Holiday representative.   Please read over the following fact sheets that you were given: Por favor [place] hojas de informacin que le dieron:   ____ Take these medicines the morning of surgery with A SIP OF WATER:          [employer] medicinas la maana de la ciruga con UN SORBO DE AGUA:  1. midodrine (PROAMATINE) 10 MG tablet  2.omeprazole (PRILOSEC) 40 MG capsule  3. albuterol (ACCUNEB) 0.63 MG/3ML nebulizer solution  4.amiodarone (PACERONE) 200 MG tablet       5.gabapentin (NEURONTIN) 100 MG capsule  6.  ____ Fleet Enema (as directed)          Enema de Fleet (segn lo indicado)    ____ Use CHG Soap as directed          Utilice el jabn de CHG segn lo indicado  ____ Use inhalers on the day of surgery          Use los inhaladores el da de la ciruga  ____ Stop metformin 2 days prior to surgery          Deje de tomar el metformin 2 das antes de la ciruga    ____ Take 1/2 of usual insulin dose the night before surgery and none on the morning of surgery           Tome la mitad de la dosis habitual de insulina la noche antes de la [place] y no tome nada en la maana de la             ciruga  ____ Stop Coumadin/Plavix/aspirin on  Deje de tomar el Coumadin/Plavix/aspirina el da:  ____ Stop Anti-inflammatories on           Deje de tomar antiinflamatorios el da:   ____ Stop supplements until after  surgery            Deje de tomar suplementos hasta despus de la ciruga  ____ Bring C-Pap to the hospital          [place] [facility] al hospital

## 2019-07-28 NOTE — Pre-Procedure Instructions (Addendum)
Called Dr Amie Critchley regarding BP. Medical clearance needed.  Notified Dr Lucky Cowboy and Dr Ladoris Gene, regarding need for clearance. PCP deferred to nephrology.

## 2019-07-28 NOTE — Pre-Procedure Instructions (Signed)
Called and spoke to Dr Juleen China, regarding BP management.  He will change medications and send clearance note.

## 2019-07-30 ENCOUNTER — Ambulatory Visit
Admission: RE | Admit: 2019-07-30 | Discharge: 2019-07-30 | Disposition: A | Discharge status: Home / Self Care | Payer: Medicare Other | Source: Ambulatory Visit | Attending: Vascular Surgery | Admitting: Vascular Surgery

## 2019-07-30 ENCOUNTER — Ambulatory Visit: Payer: Medicare Other | Admitting: Anesthesiology

## 2019-07-30 ENCOUNTER — Ambulatory Visit: Payer: Medicare Other

## 2019-07-30 ENCOUNTER — Encounter
Admission: RE | Disposition: A | Discharge status: Home / Self Care | Payer: Self-pay | Source: Ambulatory Visit | Attending: Vascular Surgery

## 2019-07-30 ENCOUNTER — Encounter: Payer: Self-pay | Admitting: Emergency Medicine

## 2019-07-30 DIAGNOSIS — E1151 Type 2 diabetes mellitus with diabetic peripheral angiopathy without gangrene: Secondary | ICD-10-CM | POA: Diagnosis not present

## 2019-07-30 DIAGNOSIS — I252 Old myocardial infarction: Secondary | ICD-10-CM | POA: Insufficient documentation

## 2019-07-30 DIAGNOSIS — I132 Hypertensive heart and chronic kidney disease with heart failure and with stage 5 chronic kidney disease, or end stage renal disease: Secondary | ICD-10-CM | POA: Diagnosis not present

## 2019-07-30 DIAGNOSIS — N186 End stage renal disease: Secondary | ICD-10-CM | POA: Insufficient documentation

## 2019-07-30 DIAGNOSIS — Z7984 Long term (current) use of oral hypoglycemic drugs: Secondary | ICD-10-CM | POA: Insufficient documentation

## 2019-07-30 DIAGNOSIS — Z9841 Cataract extraction status, right eye: Secondary | ICD-10-CM | POA: Diagnosis not present

## 2019-07-30 DIAGNOSIS — Z89512 Acquired absence of left leg below knee: Secondary | ICD-10-CM | POA: Insufficient documentation

## 2019-07-30 DIAGNOSIS — K219 Gastro-esophageal reflux disease without esophagitis: Secondary | ICD-10-CM | POA: Insufficient documentation

## 2019-07-30 DIAGNOSIS — I509 Heart failure, unspecified: Secondary | ICD-10-CM | POA: Diagnosis not present

## 2019-07-30 DIAGNOSIS — E7800 Pure hypercholesterolemia, unspecified: Secondary | ICD-10-CM | POA: Insufficient documentation

## 2019-07-30 DIAGNOSIS — Z833 Family history of diabetes mellitus: Secondary | ICD-10-CM | POA: Insufficient documentation

## 2019-07-30 DIAGNOSIS — I340 Nonrheumatic mitral (valve) insufficiency: Secondary | ICD-10-CM | POA: Diagnosis not present

## 2019-07-30 DIAGNOSIS — D631 Anemia in chronic kidney disease: Secondary | ICD-10-CM | POA: Insufficient documentation

## 2019-07-30 DIAGNOSIS — I480 Paroxysmal atrial fibrillation: Secondary | ICD-10-CM | POA: Insufficient documentation

## 2019-07-30 DIAGNOSIS — I472 Ventricular tachycardia: Secondary | ICD-10-CM | POA: Diagnosis not present

## 2019-07-30 DIAGNOSIS — Z7982 Long term (current) use of aspirin: Secondary | ICD-10-CM | POA: Insufficient documentation

## 2019-07-30 DIAGNOSIS — I2510 Atherosclerotic heart disease of native coronary artery without angina pectoris: Secondary | ICD-10-CM | POA: Insufficient documentation

## 2019-07-30 DIAGNOSIS — T82898A Other specified complication of vascular prosthetic devices, implants and grafts, initial encounter: Secondary | ICD-10-CM | POA: Diagnosis not present

## 2019-07-30 DIAGNOSIS — E1122 Type 2 diabetes mellitus with diabetic chronic kidney disease: Secondary | ICD-10-CM | POA: Insufficient documentation

## 2019-07-30 DIAGNOSIS — Z992 Dependence on renal dialysis: Secondary | ICD-10-CM | POA: Diagnosis not present

## 2019-07-30 DIAGNOSIS — E785 Hyperlipidemia, unspecified: Secondary | ICD-10-CM | POA: Insufficient documentation

## 2019-07-30 DIAGNOSIS — Z8042 Family history of malignant neoplasm of prostate: Secondary | ICD-10-CM | POA: Insufficient documentation

## 2019-07-30 DIAGNOSIS — N185 Chronic kidney disease, stage 5: Secondary | ICD-10-CM | POA: Diagnosis not present

## 2019-07-30 DIAGNOSIS — Z8673 Personal history of transient ischemic attack (TIA), and cerebral infarction without residual deficits: Secondary | ICD-10-CM | POA: Diagnosis not present

## 2019-07-30 DIAGNOSIS — Z89611 Acquired absence of right leg above knee: Secondary | ICD-10-CM | POA: Insufficient documentation

## 2019-07-30 DIAGNOSIS — Z9842 Cataract extraction status, left eye: Secondary | ICD-10-CM | POA: Insufficient documentation

## 2019-07-30 DIAGNOSIS — X58XXXA Exposure to other specified factors, initial encounter: Secondary | ICD-10-CM | POA: Insufficient documentation

## 2019-07-30 DIAGNOSIS — Z79899 Other long term (current) drug therapy: Secondary | ICD-10-CM | POA: Insufficient documentation

## 2019-07-30 DIAGNOSIS — Z7951 Long term (current) use of inhaled steroids: Secondary | ICD-10-CM | POA: Insufficient documentation

## 2019-07-30 HISTORY — PX: AV FISTULA PLACEMENT: SHX1204

## 2019-07-30 LAB — POCT I-STAT, CHEM 8
BUN: 32 mg/dL — ABNORMAL HIGH (ref 6–20)
Calcium, Ion: 1.18 mmol/L (ref 1.15–1.40)
Chloride: 96 mmol/L — ABNORMAL LOW (ref 98–111)
Creatinine, Ser: 5.7 mg/dL — ABNORMAL HIGH (ref 0.61–1.24)
Glucose, Bld: 221 mg/dL — ABNORMAL HIGH (ref 70–99)
HCT: 51 % (ref 39.0–52.0)
Hemoglobin: 17.3 g/dL — ABNORMAL HIGH (ref 13.0–17.0)
Potassium: 4.9 mmol/L (ref 3.5–5.1)
Sodium: 135 mmol/L (ref 135–145)
TCO2: 28 mmol/L (ref 22–32)

## 2019-07-30 LAB — GLUCOSE, CAPILLARY: Glucose-Capillary: 147 mg/dL — ABNORMAL HIGH (ref 70–99)

## 2019-07-30 SURGERY — ARTERIOVENOUS (AV) FISTULA CREATION
Anesthesia: Monitor Anesthesia Care | Site: Arm Lower | Laterality: Left

## 2019-07-30 MED ORDER — MIDAZOLAM HCL 2 MG/2ML IJ SOLN
INTRAMUSCULAR | Status: AC
  Administered 2019-07-30: 1 mg via INTRAVENOUS
  Filled 2019-07-30: qty 2

## 2019-07-30 MED ORDER — OXYCODONE HCL 5 MG PO TABS: 5.0000 mg | ORAL_TABLET | Freq: Four times a day (QID) | ORAL | 0 refills | Status: DC | PRN

## 2019-07-30 MED ORDER — BUPIVACAINE-EPINEPHRINE (PF) 0.5% -1:200000 IJ SOLN
INTRAMUSCULAR | Status: AC
  Filled 2019-07-30: qty 30

## 2019-07-30 MED ORDER — HEPARIN SODIUM (PORCINE) 1000 UNIT/ML IJ SOLN
INTRAMUSCULAR | Status: AC
  Filled 2019-07-30: qty 1

## 2019-07-30 MED ORDER — EPINEPHRINE PF 1 MG/ML IJ SOLN
INTRAMUSCULAR | Status: AC
  Filled 2019-07-30: qty 1

## 2019-07-30 MED ORDER — ROPIVACAINE HCL 5 MG/ML IJ SOLN
INTRAMUSCULAR | Status: DC | PRN
  Administered 2019-07-30: 30 mL via EPIDURAL

## 2019-07-30 MED ORDER — FENTANYL CITRATE (PF) 100 MCG/2ML IJ SOLN: 25.0000 ug | INTRAMUSCULAR | Status: DC | PRN

## 2019-07-30 MED ORDER — LIDOCAINE HCL (PF) 1 % IJ SOLN
INTRAMUSCULAR | Status: AC
  Filled 2019-07-30: qty 5

## 2019-07-30 MED ORDER — FENTANYL CITRATE (PF) 100 MCG/2ML IJ SOLN
50.0000 ug | Freq: Once | INTRAMUSCULAR | Status: AC
  Administered 2019-07-30: 13:00:00 50 ug via INTRAVENOUS

## 2019-07-30 MED ORDER — ACETAMINOPHEN 160 MG/5ML PO SOLN
325.0000 mg | ORAL | Status: DC | PRN
  Filled 2019-07-30: qty 20.3

## 2019-07-30 MED ORDER — SODIUM CHLORIDE 0.9 % IV SOLN
INTRAVENOUS | Status: DC
  Administered 2019-07-30: 13:00:00 via INTRAVENOUS

## 2019-07-30 MED ORDER — HYDROCODONE-ACETAMINOPHEN 7.5-325 MG PO TABS: 1.0000 | ORAL_TABLET | Freq: Once | ORAL | Status: DC | PRN

## 2019-07-30 MED ORDER — MIDAZOLAM HCL 2 MG/2ML IJ SOLN
1.0000 mg | Freq: Once | INTRAMUSCULAR | Status: AC
  Administered 2019-07-30: 13:00:00 1 mg via INTRAVENOUS

## 2019-07-30 MED ORDER — ACETAMINOPHEN 325 MG PO TABS: 325.0000 mg | ORAL_TABLET | ORAL | Status: DC | PRN

## 2019-07-30 MED ORDER — PROPOFOL 500 MG/50ML IV EMUL
INTRAVENOUS | Status: AC
  Filled 2019-07-30: qty 50

## 2019-07-30 MED ORDER — CHLORHEXIDINE GLUCONATE CLOTH 2 % EX PADS: 6.0000 | MEDICATED_PAD | Freq: Once | CUTANEOUS | Status: DC

## 2019-07-30 MED ORDER — LIDOCAINE HCL (PF) 1 % IJ SOLN
INTRAMUSCULAR | Status: DC | PRN
  Administered 2019-07-30: 4 mL

## 2019-07-30 MED ORDER — DEXMEDETOMIDINE HCL 200 MCG/2ML IV SOLN
INTRAVENOUS | Status: DC | PRN
  Administered 2019-07-30 (×4): 8 ug via INTRAVENOUS

## 2019-07-30 MED ORDER — SODIUM CHLORIDE 0.9 % IV SOLN
INTRAVENOUS | Status: DC | PRN
  Administered 2019-07-30: 14:00:00 via INTRAMUSCULAR

## 2019-07-30 MED ORDER — CEFAZOLIN SODIUM-DEXTROSE 1-4 GM/50ML-% IV SOLN
1.0000 g | INTRAVENOUS | Status: AC
  Administered 2019-07-30: 13:00:00 1 g via INTRAVENOUS

## 2019-07-30 MED ORDER — HEPARIN SODIUM (PORCINE) 5000 UNIT/ML IJ SOLN
INTRAMUSCULAR | Status: AC
  Filled 2019-07-30: qty 1

## 2019-07-30 MED ORDER — PROMETHAZINE HCL 25 MG/ML IJ SOLN: 6.2500 mg | INTRAMUSCULAR | Status: DC | PRN

## 2019-07-30 MED ORDER — CEFAZOLIN SODIUM-DEXTROSE 1-4 GM/50ML-% IV SOLN
INTRAVENOUS | Status: AC
  Filled 2019-07-30: qty 50

## 2019-07-30 MED ORDER — FENTANYL CITRATE (PF) 100 MCG/2ML IJ SOLN
INTRAMUSCULAR | Status: AC
  Administered 2019-07-30: 50 ug via INTRAVENOUS
  Filled 2019-07-30: qty 2

## 2019-07-30 MED ORDER — PAPAVERINE HCL 30 MG/ML IJ SOLN
INTRAMUSCULAR | Status: AC
  Filled 2019-07-30: qty 2

## 2019-07-30 MED ORDER — ROPIVACAINE HCL 5 MG/ML IJ SOLN
INTRAMUSCULAR | Status: AC
  Filled 2019-07-30: qty 30

## 2019-07-30 MED ORDER — HEPARIN SODIUM (PORCINE) 1000 UNIT/ML IJ SOLN
INTRAMUSCULAR | Status: DC | PRN
  Administered 2019-07-30: 3000 [IU] via INTRAVENOUS

## 2019-07-30 SURGICAL SUPPLY — 50 items
BAG DECANTER FOR FLEXI CONT (MISCELLANEOUS) ×3 IMPLANT
BLADE SURG SZ11 CARB STEEL (BLADE) ×3 IMPLANT
BOOT SUTURE AID YELLOW STND (SUTURE) ×3 IMPLANT
BRUSH SCRUB EZ  4% CHG (MISCELLANEOUS) ×2
BRUSH SCRUB EZ 4% CHG (MISCELLANEOUS) ×1 IMPLANT
CANISTER SUCT 1200ML W/VALVE (MISCELLANEOUS) ×3 IMPLANT
CHLORAPREP W/TINT 26 (MISCELLANEOUS) ×3 IMPLANT
CLIP SPRNG 6MM S-JAW DBL (CLIP) ×3
COVER WAND RF STERILE (DRAPES) ×3 IMPLANT
DERMABOND ADVANCED (GAUZE/BANDAGES/DRESSINGS) ×2
DERMABOND ADVANCED .7 DNX12 (GAUZE/BANDAGES/DRESSINGS) ×1 IMPLANT
ELECT CAUTERY BLADE 6.4 (BLADE) ×3 IMPLANT
ELECT REM PT RETURN 9FT ADLT (ELECTROSURGICAL) ×3
ELECTRODE REM PT RTRN 9FT ADLT (ELECTROSURGICAL) ×1 IMPLANT
GEL ULTRASOUND 20GR AQUASONIC (MISCELLANEOUS) IMPLANT
GLOVE BIO SURGEON STRL SZ7 (GLOVE) ×6 IMPLANT
GLOVE INDICATOR 7.5 STRL GRN (GLOVE) ×3 IMPLANT
GOWN STRL REUS W/ TWL LRG LVL3 (GOWN DISPOSABLE) ×2 IMPLANT
GOWN STRL REUS W/ TWL XL LVL3 (GOWN DISPOSABLE) ×1 IMPLANT
GOWN STRL REUS W/TWL LRG LVL3 (GOWN DISPOSABLE) ×4
GOWN STRL REUS W/TWL XL LVL3 (GOWN DISPOSABLE) ×2
HEMOSTAT SURGICEL 2X3 (HEMOSTASIS) ×3 IMPLANT
IV NS 500ML (IV SOLUTION) ×2
IV NS 500ML BAXH (IV SOLUTION) ×1 IMPLANT
KIT TURNOVER KIT A (KITS) ×3 IMPLANT
LABEL OR SOLS (LABEL) ×3 IMPLANT
LOOP RED MAXI  1X406MM (MISCELLANEOUS) ×4
LOOP VESSEL MAXI 1X406 RED (MISCELLANEOUS) ×2 IMPLANT
LOOP VESSEL MINI 0.8X406 BLUE (MISCELLANEOUS) ×1 IMPLANT
LOOPS BLUE MINI 0.8X406MM (MISCELLANEOUS) ×2
NEEDLE FILTER BLUNT 18X 1/2SAF (NEEDLE) ×2
NEEDLE FILTER BLUNT 18X1 1/2 (NEEDLE) ×1 IMPLANT
NS IRRIG 500ML POUR BTL (IV SOLUTION) ×3 IMPLANT
PACK EXTREMITY ARMC (MISCELLANEOUS) ×3 IMPLANT
PAD PREP 24X41 OB/GYN DISP (PERSONAL CARE ITEMS) ×3 IMPLANT
SOLUTION CELL SAVER (CLIP) ×1 IMPLANT
STOCKINETTE STRL 4IN 9604848 (GAUZE/BANDAGES/DRESSINGS) ×3 IMPLANT
SUT MNCRL AB 4-0 PS2 18 (SUTURE) ×3 IMPLANT
SUT PROLENE 6 0 BV (SUTURE) ×12 IMPLANT
SUT SILK 2 0 (SUTURE) ×2
SUT SILK 2-0 18XBRD TIE 12 (SUTURE) ×1 IMPLANT
SUT SILK 3 0 (SUTURE) ×2
SUT SILK 3-0 18XBRD TIE 12 (SUTURE) ×1 IMPLANT
SUT SILK 4 0 (SUTURE) ×2
SUT SILK 4-0 18XBRD TIE 12 (SUTURE) ×1 IMPLANT
SUT VIC AB 3-0 SH 27 (SUTURE) ×2
SUT VIC AB 3-0 SH 27X BRD (SUTURE) ×1 IMPLANT
SYR 20ML LL LF (SYRINGE) ×3 IMPLANT
SYR 3ML LL SCALE MARK (SYRINGE) ×3 IMPLANT
SYR TB 1ML 27GX1/2 LL (SYRINGE) IMPLANT

## 2019-07-30 NOTE — Anesthesia Procedure Notes (Signed)
Anesthesia Regional Block: Supraclavicular block   Pre-Anesthetic Checklist: ,, timeout performed, Correct Patient, Correct Site, Correct Laterality, Correct Procedure, Correct Position, site marked, Risks and benefits discussed,  Surgical consent,  Pre-op evaluation,  At surgeon's request and post-op pain management  Laterality: Left  Prep: chloraprep       Needles:  Injection technique: Single-shot  Needle Type: Echogenic Stimulator Needle     Needle Length: 10cm  Needle Gauge: 20   Needle insertion depth: 7 cm   Additional Needles:   Procedures: Doppler guided,,,, ultrasound used (permanent image in chart),,,,  Narrative:  Start time: 07/30/2019 12:35 PM End time: 07/30/2019 12:44 PM Injection made incrementally with aspirations every 5 mL. Anesthesiologist: Alphonsus Sias, MD  Additional Notes: Functioning IV was confirmed, O2 and monitors were applied. Mild IV sedation. A 120mm 20ga Echoplex needle was used. Sterile prep and drape,hand hygiene and sterile gloves were used.  Negative aspiration and negative test dose prior to incremental administration of local anesthetic. The patient tolerated the procedure well, images were stored. Total LA: 4cc 1% lido for skin, 30cc 0.5% naropin w 1:200 epi.

## 2019-07-30 NOTE — Transfer of Care (Signed)
Immediate Anesthesia Transfer of Care Note  Patient: [name]  Procedure(s) Performed: ARTERIOVENOUS (AV) FISTULA CREATION (BRACHIAL CEPHALIC ) (Left Arm Lower)  Patient Location: PACU  Anesthesia Type:MAC and Regional  Level of Consciousness: awake and alert   Airway & Oxygen Therapy: Patient Spontanous Breathing and Patient connected to face mask oxygen  Post-op Assessment: Report given to RN and Post -op Vital signs reviewed and stable  Post vital signs: Reviewed and stable  Last Vitals:  Vitals Value Taken Time  BP 112/49 07/30/19 1420  Temp    Pulse 63 07/30/19 1420  Resp 13 07/30/19 1420  SpO2 98 % 07/30/19 1420  Vitals shown include unvalidated device data.  Last Pain:  Vitals:   07/30/19 1149  TempSrc: Temporal  PainSc: 0-No pain         Complications: No apparent anesthesia complications

## 2019-07-30 NOTE — Anesthesia Preprocedure Evaluation (Addendum)
Anesthesia Evaluation  Patient identified by MRN, date of birth, ID band Patient awake    Reviewed: Allergy & Precautions, H&P , NPO status , reviewed documented beta blocker date and time   History of Anesthesia Complications (+) PONV and history of anesthetic complications  Airway Mallampati: III  TM Distance: >3 FB Neck ROM: full    Dental  (+) Poor Dentition, Chipped, Missing   Pulmonary shortness of breath,    Pulmonary exam normal        Cardiovascular hypertension, + CAD, + Past MI, + Peripheral Vascular Disease, +CHF and + PND  Normal cardiovascular exam  2019 ECHO Study Conclusions  - Left ventricle: The cavity size was normal. There was moderate   concentric hypertrophy. Systolic function was normal. The   estimated ejection fraction was in the range of 55% to 60%. Wall   motion was normal; there were no regional wall motion   abnormalities. Features are consistent with a pseudonormal left   ventricular filling pattern, with concomitant abnormal relaxation   and increased filling pressure (grade 2 diastolic dysfunction). - Mitral valve: There was mild regurgitation. - Left atrium: The atrium was mildly dilated. - Pulmonary arteries: Systolic pressure was mildly elevated. PA   peak pressure: 41 mm Hg (S). - Pericardium, extracardiac: A small to moderate sized   circumferential pericardial effusion was identified. 3 cm pocket   noted outside the RV free wall, otherwise 1 cm circumferential   effusion. Features were not consistent with tamponade physiology   Neuro/Psych  Neuromuscular disease CVA    GI/Hepatic GERD  Medicated and Controlled,  Endo/Other  diabetes  Renal/GU Renal disease     Musculoskeletal   Abdominal   Peds  Hematology  (+) Blood dyscrasia, anemia ,   Anesthesia Other Findings Past Medical History: No date: (HFpEF) heart failure with preserved ejection fraction ([place])     Comment:   a. 2017 EF per LHC 30-35%; b. 09/2018 Echo: EF 55-60%,               no rwma, Gr2 DD, mild MR, mildly dil LA. PASP 37mHg.               small to mod circumferential pericardial eff, no               tamponade. No date: Anemia of chronic disease No date: Atherosclerosis No date: CAD (coronary artery disease)     Comment:  a. 2017 LHC, severe 1v disease, occluded pRCA, L-R               collaterals, moderate LV dysfunction EF 30-35. No date: Diabetes mellitus with complication (HCC) No date: End stage renal disease on dialysis (HCC)     Comment:  Labile BP No date: GERD (gastroesophageal reflux disease) No date: History of ventricular tachycardia     Comment:  a. 2017 polymorphic VT and cardiac arrest. No date: Hypercholesteremia No date: Hyperlipidemia LDL goal <70 No date: Hypertension No date: Labile blood pressure     Comment:  on HD No date: Myocardial infarction ([facility]     Comment:  heart stopped 2019 No date: NSVT (nonsustained ventricular tachycardia) ([facility]     Comment:  08/2018 NSVT No date: PAF (paroxysmal atrial fibrillation) ([facility]     Comment:  a. 08/2018 CHA2DS2VASc at least 6-->initially placed on               eliquis but d/c'd 2/2 anemia. On Amio. No date: Peripheral arterial occlusive disease ([facility]  Comment:  s/p L BKA, R AKA No date: PONV (postoperative nausea and vomiting) No date: S/P AKA (above knee amputation) unilateral, right ([place])     Comment:  08/2018 No date: S/P BKA (below knee amputation) unilateral, left ([place])     Comment:  2015 No date: Stroke [facility])  Past Surgical History: 01/09/2017: A/V FISTULAGRAM; Left     Comment:  Procedure: A/V Fistulagram;  Surgeon: [name], MD;                Location: [facility];  Service: Cardiovascular;              Laterality: Left; 06/06/2017: A/V FISTULAGRAM; Left     Comment:  Procedure: A/V Fistulagram;  Surgeon: [name], MD;               Location: [facility];  Service:  Cardiovascular;              Laterality: Left; 08/15/2017: A/V FISTULAGRAM; Left     Comment:  Procedure: A/V Fistulagram;  Surgeon: [name], MD;               Location: [facility];  Service: Cardiovascular;              Laterality: Left; 01/22/2018: A/V FISTULAGRAM; Left     Comment:  Procedure: A/V FISTULAGRAM;  Surgeon: [name], MD;               Location: [facility];  Service: Cardiovascular;              Laterality: Left; 07/16/2019: A/V FISTULAGRAM; Left     Comment:  Procedure: A/V FISTULAGRAM;  Surgeon: [name], MD;               Location: [facility];  Service: Cardiovascular;              Laterality: Left; 06/06/2017: A/V SHUNT INTERVENTION; N/A     Comment:  Procedure: A/V SHUNT INTERVENTION;  Surgeon: [name], MD;  Location: [facility];  Service:               Cardiovascular;  Laterality: N/A; 04/25/2016: AMPUTATION; Right     Comment:  Procedure: TOE AMPUTATION;  Surgeon: Katha Cabal,               MD;  Location: [facility];  Service: Vascular;  Laterality:              Right; 09/09/2018: AMPUTATION; Right     Comment:  Procedure: AMPUTATION ABOVE KNEE;  Surgeon: [name], MD;  Location: [facility];  Service: Vascular;                Laterality: Right; 9/50/9326: APPLICATION OF WOUND VAC; Right     Comment:  Procedure: APPLICATION OF WOUND VAC;  Surgeon: Katha Cabal, MD;  Location: [facility];  Service: Vascular;                Laterality: Right; 04/14/2457: APPLICATION OF WOUND VAC; Right     Comment:  Procedure: APPLICATION OF WOUND VAC;  Surgeon: Lucky Cowboy,  Erskine Squibb, MD;  Location: [facility];  Service: Vascular;                Laterality: Right;  wound vac 2015: AV FISTULA PLACEMENT; Left 04/30/2016: CARDIAC CATHETERIZATION; N/A     Comment:  Procedure: Left Heart Cath and Coronary Angiography;                Surgeon: Wellington Hampshire, MD;  Location: [facility]               [facility];  Service: Cardiovascular;  Laterality: N/A; No date: CATARACT EXTRACTION, BILATERAL No date: LEG AMPUTATION BELOW KNEE; Left 07/24/2018: LOWER EXTREMITY ANGIOGRAPHY; Right     Comment:  Procedure: LOWER EXTREMITY ANGIOGRAPHY;  Surgeon: [name], MD;  Location: [facility];  Service:               Cardiovascular;  Laterality: Right; 08/13/2018: LOWER EXTREMITY ANGIOGRAPHY; Right     Comment:  Procedure: LOWER EXTREMITY ANGIOGRAPHY;  Surgeon: [name], MD;  Location: [facility];  Service:               Cardiovascular;  Laterality: Right; 09/04/2018: LOWER EXTREMITY ANGIOGRAPHY; Right     Comment:  Procedure: Lower Extremity Angiography;  Surgeon: [name], MD;  Location: [facility];  Service:               Cardiovascular;  Laterality: Right; 10/03/2015: PERIPHERAL VASCULAR CATHETERIZATION; N/A     Comment:  Procedure: A/V Shuntogram/Fistulagram;  Surgeon: [name], MD;  Location: [facility];  Service:               Cardiovascular;  Laterality: N/A; 01/16/2016: PERIPHERAL VASCULAR CATHETERIZATION; N/A     Comment:  Procedure: Abdominal Aortogram w/Lower Extremity;                Surgeon: [name], MD;  Location: [facility]               [facility];  Service: Cardiovascular;  Laterality: N/A; 01/16/2016: PERIPHERAL VASCULAR CATHETERIZATION     Comment:  Procedure: Lower Extremity Intervention;  Surgeon: [name], MD;  Location: [facility];  Service:               Cardiovascular;; 04/27/2016: PERIPHERAL VASCULAR CATHETERIZATION; Right     Comment:  Procedure: Lower Extremity Angiography;  Surgeon:               Katha Cabal, MD;  Location: [facility];                Service: Cardiovascular;  Laterality: Right; 04/27/2016: PERIPHERAL VASCULAR CATHETERIZATION     Comment:  Procedure: Lower Extremity Intervention;   Surgeon:               Katha Cabal, MD;  Location: [facility];                Service: Cardiovascular;; 07/22/2019: PERIPHERAL VASCULAR THROMBECTOMY; Left     Comment:  Procedure: DIAYLSIS CATHETER INSERTION;  Surgeon:               Katha Cabal, MD;  Location: [facility];               Service: Cardiovascular;  Laterality: Left; 04/28/2016: TRANSMETATARSAL AMPUTATION; Right     Comment:  Procedure: TRANSMETATARSAL AMPUTATION;  Surgeon: Albertine Patricia, DPM;  Location: [facility];  Service: Podiatry;                Laterality: Right; 04/25/2016: WOUND DEBRIDEMENT; Right     Comment:  Procedure: DEBRIDEMENT WOUND;  Surgeon: Katha Cabal, MD;  Location: [facility];  Service: Vascular;                Laterality: Right; 12/18/2018: WOUND DEBRIDEMENT; Right     Comment:  Procedure: DEBRIDEMENT WOUND;  Surgeon: [name],               MD;  Location: [facility];  Service: Vascular;  Laterality:              Right;  right stump  BMI    Body Mass Index: 32.89 kg/m      Reproductive/Obstetrics                           Anesthesia Physical Anesthesia Plan  ASA: IV  Anesthesia Plan: MAC   Post-op Pain Management:  Regional for Post-op pain   Induction: Intravenous  PONV Risk Score and Plan: TIVA  Airway Management Planned: Nasal Cannula and Natural Airway  Additional Equipment:   Intra-op Plan:   Post-operative Plan:   Informed Consent: I have reviewed the patients History and Physical, chart, labs and discussed the procedure including the risks, benefits and alternatives for the proposed anesthesia with the patient or authorized representative who has indicated his/her understanding and acceptance.     Dental Advisory Given  Plan Discussed with: CRNA  Anesthesia Plan Comments:         Anesthesia Quick Evaluation

## 2019-07-30 NOTE — Anesthesia Post-op Follow-up Note (Signed)
Anesthesia QCDR form completed.        

## 2019-07-30 NOTE — OR Nursing (Signed)
Patient with a good thrill and bruit of left arm fistula. Arm in sling for protection due to nerve block. Patient and wife educated via [place] interpreter to protect arm in sling until full sensation has returned. They report good understanding.

## 2019-07-30 NOTE — Op Note (Signed)
[facility]   OPERATIVE NOTE   PROCEDURE: Left brachiocephalic arteriovenous fistula placement  PRE-OPERATIVE DIAGNOSIS: 1.  ESRD      2. Failed left radiocephalic AVF  POST-OPERATIVE DIAGNOSIS: 1. ESRD     2. Failed left radiocephalic AVF  SURGEON: Leotis Pain, MD  ASSISTANT(S): Hezzie Bump, PA-C  ANESTHESIA: general  ESTIMATED BLOOD LOSS: 10 cc  FINDING(S): Adequate cephalic vein for fistula creation  SPECIMEN(S):  none  INDICATIONS:   [name] is a 58 y.o. male who presents with renal failure in need of pemanent dialysis acces.  The patient is scheduled for left arm AVF placement.  The patient is aware the risks include but are not limited to: bleeding, infection, steal syndrome, nerve damage, ischemic monomelic neuropathy, failure to mature, and need for additional procedures.  The patient is aware of the risks of the procedure and elects to proceed forward. An assistant was present during the procedure to help facilitate the exposure and expedite the procedure.  DESCRIPTION: After full informed written consent was obtained from the patient, the patient was brought back to the operating room and placed supine upon the operating table.  Prior to induction, the patient received IV antibiotics.  The assistant provided retraction and mobilization to help facilitate exposure and expedite the procedure throughout the entire procedure.  This included following suture, using retractors, and optimizing lighting. After obtaining adequate anesthesia, the patient was then prepped and draped in the standard fashion for a left arm access procedure.  I made a curvilinear incision at the level of the antecubital fossa and dissected through the subcutaneous tissue and fascia to gain exposure of the brachial artery.  This was noted to be patent and adequate in size for fistula creation.  This was dissected out proximally and distally and prepared for control with vessel  loops .  I then dissected out the cephalic vein.  This was noted to be patent and adequate in size for fistula creation.  I then gave the patient 3000 units of intravenous heparin.  The vein was marked for orientation and the distal segment of the vein and a large branch were ligated with 2-0 silk ties at the level of the branch to create a large fished mouth opening, and the vein was transected.  I then instilled the heparinized saline into the vein and clamped it.  At this point, I reset my exposure of the brachial artery and pulled up control on the vessel loops.  I made an arteriotomy with a #11 blade, and then I extended the arteriotomy with a Potts scissor into the proximal radial artery.  I injected heparinized saline proximal and distal to this arteriotomy.  The vein was then sewn to the artery in an end-to-side configuration with a running stitch of 6-0 Prolene.  Prior to completing this anastomosis, I allowed the vein and artery to backbleed.  There was no evidence of clot from any vessels.  I completed the anastomosis in the usual fashion and then released all vessel loops and clamps.  There was a palpable  thrill in the venous outflow, and there was a palpable pulse in the artery distal to the anastomosis.  At this point, I irrigated out the surgical wound.  Surgicel was placed. There was no further active bleeding.  The subcutaneous tissue was reapproximated with a running stitch of 3-0 Vicryl.  The skin was then closed with a 4-0 Monocryl suture.  The skin was then cleaned, dried, and reinforced  with Dermabond.  The patient tolerated this procedure well and was taken to the recovery room in stable condition  COMPLICATIONS: None  CONDITION: Stable   Leotis Pain    07/30/2019, 2:15 PM  This note was created with Dragon Medical transcription system. Any errors in dictation are purely unintentional.

## 2019-07-30 NOTE — Discharge Instructions (Signed)

## 2019-07-30 NOTE — H&P (Signed)
 [facility] History & Physical Update  The patient was interviewed and re-examined.  The patient's previous History and Physical has been reviewed and is unchanged.  There is no change in the plan of care. We plan to proceed with the scheduled procedure.  Leotis Pain, MD  07/30/2019, 11:59 AM

## 2019-07-31 ENCOUNTER — Encounter: Payer: Self-pay | Admitting: Vascular Surgery

## 2019-08-04 ENCOUNTER — Telehealth (INDEPENDENT_AMBULATORY_CARE_PROVIDER_SITE_OTHER): Payer: Self-pay

## 2019-08-04 NOTE — Telephone Encounter (Signed)
[name] from [employer] called stating that the weekend nurse tried to call the patient all weekend with no answer and once they did get in touch the family stated they speak no english and hang the phone up on weekend nurse.[name] stated they are not sure if will be able to do this patient. [facility] is probably going to close the referral out for this patient.

## 2019-08-05 NOTE — Anesthesia Postprocedure Evaluation (Signed)
Anesthesia Post Note  Patient: [name]  Procedure(s) Performed: ARTERIOVENOUS (AV) FISTULA CREATION (BRACHIAL CEPHALIC ) (Left Arm Lower)  Patient location during evaluation: PACU Anesthesia Type: MAC Level of consciousness: awake and alert Pain management: pain level controlled Vital Signs Assessment: post-procedure vital signs reviewed and stable Respiratory status: spontaneous breathing, nonlabored ventilation and respiratory function stable Cardiovascular status: stable and blood pressure returned to baseline Postop Assessment: no apparent nausea or vomiting Anesthetic complications: no     Last Vitals:  Vitals:   07/30/19 1505 07/30/19 1534  BP: (!) 116/55 111/62  Pulse: 66 73  Resp: 18 18  Temp: (!) 36.1 C   SpO2: 98% 96%    Last Pain:  Vitals:   07/30/19 1534  TempSrc:   PainSc: 0-No pain                 Alphonsus Sias

## 2019-08-13 ENCOUNTER — Ambulatory Visit (INDEPENDENT_AMBULATORY_CARE_PROVIDER_SITE_OTHER): Payer: Medicare Other | Admitting: Nurse Practitioner

## 2019-08-13 ENCOUNTER — Encounter (INDEPENDENT_AMBULATORY_CARE_PROVIDER_SITE_OTHER): Payer: Medicare Other

## 2019-08-27 ENCOUNTER — Encounter (INDEPENDENT_AMBULATORY_CARE_PROVIDER_SITE_OTHER): Payer: Self-pay | Admitting: Nurse Practitioner

## 2019-08-27 ENCOUNTER — Encounter (INDEPENDENT_AMBULATORY_CARE_PROVIDER_SITE_OTHER): Payer: Self-pay

## 2019-08-27 ENCOUNTER — Other Ambulatory Visit: Payer: Self-pay

## 2019-08-27 ENCOUNTER — Ambulatory Visit (INDEPENDENT_AMBULATORY_CARE_PROVIDER_SITE_OTHER): Payer: Medicare Other

## 2019-08-27 ENCOUNTER — Ambulatory Visit (INDEPENDENT_AMBULATORY_CARE_PROVIDER_SITE_OTHER): Payer: Medicare Other | Admitting: Nurse Practitioner

## 2019-08-27 ENCOUNTER — Other Ambulatory Visit (INDEPENDENT_AMBULATORY_CARE_PROVIDER_SITE_OTHER): Payer: Self-pay | Admitting: Vascular Surgery

## 2019-08-27 VITALS — BP 114/72 | HR 75 | Resp 16

## 2019-08-27 DIAGNOSIS — N186 End stage renal disease: Secondary | ICD-10-CM

## 2019-08-27 DIAGNOSIS — T829XXS Unspecified complication of cardiac and vascular prosthetic device, implant and graft, sequela: Secondary | ICD-10-CM

## 2019-08-27 DIAGNOSIS — E1152 Type 2 diabetes mellitus with diabetic peripheral angiopathy with gangrene: Secondary | ICD-10-CM

## 2019-08-27 DIAGNOSIS — Z9889 Other specified postprocedural states: Secondary | ICD-10-CM

## 2019-08-27 MED ORDER — OXYCODONE HCL 5 MG PO TABS: 5.0000 mg | ORAL_TABLET | Freq: Four times a day (QID) | ORAL | 0 refills | Status: DC | PRN

## 2019-08-30 ENCOUNTER — Encounter (INDEPENDENT_AMBULATORY_CARE_PROVIDER_SITE_OTHER): Payer: Self-pay | Admitting: Nurse Practitioner

## 2019-08-30 NOTE — Progress Notes (Signed)
SUBJECTIVE:  Patient ID: [name], male    DOB: [birth year], 58 y.o.   MRN: [id number] Chief Complaint  Patient presents with  . Follow-up    [facility] post fistula follow up    HPI  [name] is a 58 y.o. male that presents today for evaluation of his left brachiocephalic AV fistula.  The patient previously had a failed radiocephalic AV fistula that was converted on 07/30/2019.  The patient complains of some pain on the hand only when being palpated.  He denies any complaints of pain consistent with steal syndrome.  The patient's incision has dehisced superficially.  The wound is clean dry and intact.  He denies any fever, chills, nausea, vomiting or diarrhea.  Today the patient underwent noninvasive studies which revealed a flow volume of 1465.  There was no evidence of any significant stenoses.  Past Medical History:  Diagnosis Date  . (HFpEF) heart failure with preserved ejection fraction ([place])    a. 2017 EF per LHC 30-35%; b. 09/2018 Echo: EF 55-60%, no rwma, Gr2 DD, mild MR, mildly dil LA. PASP 35mmHg. small to mod circumferential pericardial eff, no tamponade.  . Anemia of chronic disease   . Atherosclerosis   . CAD (coronary artery disease)    a. 2017 LHC, severe 1v disease, occluded pRCA, L-R collaterals, moderate LV dysfunction EF 30-35.  . Diabetes mellitus with complication ([place])   . End stage renal disease on dialysis ([place])    Labile BP  . GERD (gastroesophageal reflux disease)   . History of ventricular tachycardia    a. 2017 polymorphic VT and cardiac arrest.  . Hypercholesteremia   . Hyperlipidemia LDL goal <70   . Hypertension   . Labile blood pressure    on HD  . Myocardial infarction ([place])    heart stopped 2019  . NSVT (nonsustained ventricular tachycardia) ([place])    08/2018 NSVT  . PAF (paroxysmal atrial fibrillation) ([place])    a. 08/2018 CHA2DS2VASc at least 6-->initially placed on eliquis but d/c'd 2/2 anemia. On Amio.  Marland Kitchen Peripheral arterial occlusive  disease (HCC)    s/p L BKA, R AKA  . PONV (postoperative nausea and vomiting)   . S/P AKA (above knee amputation) unilateral, right ([place])    08/2018  . S/P BKA (below knee amputation) unilateral, left ([place])    2015  . Stroke [facility])     Past Surgical History:  Procedure Laterality Date  . A/V FISTULAGRAM Left 01/09/2017   Procedure: A/V Fistulagram;  Surgeon: [name], MD;  Location: [place] [facility];  Service: Cardiovascular;  Laterality: Left;  . A/V FISTULAGRAM Left 06/06/2017   Procedure: A/V Fistulagram;  Surgeon: [name], MD;  Location: [place] [facility];  Service: Cardiovascular;  Laterality: Left;  . A/V FISTULAGRAM Left 08/15/2017   Procedure: A/V Fistulagram;  Surgeon: [name], MD;  Location: [place] [facility];  Service: Cardiovascular;  Laterality: Left;  . A/V FISTULAGRAM Left 01/22/2018   Procedure: A/V FISTULAGRAM;  Surgeon: [name], MD;  Location: [place] [facility];  Service: Cardiovascular;  Laterality: Left;  . A/V FISTULAGRAM Left 07/16/2019   Procedure: A/V FISTULAGRAM;  Surgeon: [name], MD;  Location: [place] [facility];  Service: Cardiovascular;  Laterality: Left;  . A/V SHUNT INTERVENTION N/A 06/06/2017   Procedure: A/V SHUNT INTERVENTION;  Surgeon: [name], MD;  Location: [place] [facility];  Service: Cardiovascular;  Laterality: N/A;  . AMPUTATION Right 04/25/2016  Procedure: TOE AMPUTATION;  Surgeon: Katha Cabal, MD;  Location: [facility];  Service: Vascular;  Laterality: Right;  . AMPUTATION Right 09/09/2018   Procedure: AMPUTATION ABOVE KNEE;  Surgeon: [name], MD;  Location: [facility];  Service: Vascular;  Laterality: Right;  . APPLICATION OF WOUND VAC Right 04/25/2016   Procedure: APPLICATION OF WOUND VAC;  Surgeon: Katha Cabal, MD;  Location: [facility];  Service: Vascular;  Laterality: Right;  . APPLICATION OF WOUND VAC Right 12/18/2018   Procedure: APPLICATION OF WOUND VAC;  Surgeon: [name], MD;   Location: [facility];  Service: Vascular;  Laterality: Right;  wound vac  . AV FISTULA PLACEMENT Left 2015  . AV FISTULA PLACEMENT Left 07/30/2019   Procedure: ARTERIOVENOUS (AV) FISTULA CREATION (BRACHIAL CEPHALIC );  Surgeon: [name], MD;  Location: [facility];  Service: Vascular;  Laterality: Left;  . CARDIAC CATHETERIZATION N/A 04/30/2016   Procedure: Left Heart Cath and Coronary Angiography;  Surgeon: Wellington Hampshire, MD;  Location: [place] [facility];  Service: Cardiovascular;  Laterality: N/A;  . CATARACT EXTRACTION, BILATERAL    . LEG AMPUTATION BELOW KNEE Left   . LOWER EXTREMITY ANGIOGRAPHY Right 07/24/2018   Procedure: LOWER EXTREMITY ANGIOGRAPHY;  Surgeon: [name], MD;  Location: [place] [facility];  Service: Cardiovascular;  Laterality: Right;  . LOWER EXTREMITY ANGIOGRAPHY Right 08/13/2018   Procedure: LOWER EXTREMITY ANGIOGRAPHY;  Surgeon: [name], MD;  Location: [place] [facility];  Service: Cardiovascular;  Laterality: Right;  . LOWER EXTREMITY ANGIOGRAPHY Right 09/04/2018   Procedure: Lower Extremity Angiography;  Surgeon: [name], MD;  Location: [place] [facility];  Service: Cardiovascular;  Laterality: Right;  . PERIPHERAL VASCULAR CATHETERIZATION N/A 10/03/2015   Procedure: A/V Shuntogram/Fistulagram;  Surgeon: [name], MD;  Location: [place] [facility];  Service: Cardiovascular;  Laterality: N/A;  . PERIPHERAL VASCULAR CATHETERIZATION N/A 01/16/2016   Procedure: Abdominal Aortogram w/Lower Extremity;  Surgeon: [name], MD;  Location: [place] [facility];  Service: Cardiovascular;  Laterality: N/A;  . PERIPHERAL VASCULAR CATHETERIZATION  01/16/2016   Procedure: Lower Extremity Intervention;  Surgeon: [name], MD;  Location: [place] [facility];  Service: Cardiovascular;;  . PERIPHERAL VASCULAR CATHETERIZATION Right 04/27/2016   Procedure: Lower Extremity Angiography;  Surgeon: Katha Cabal, MD;  Location: [place] [facility];   Service: Cardiovascular;  Laterality: Right;  . PERIPHERAL VASCULAR CATHETERIZATION  04/27/2016   Procedure: Lower Extremity Intervention;  Surgeon: Katha Cabal, MD;  Location: [place] [facility];  Service: Cardiovascular;;  . PERIPHERAL VASCULAR THROMBECTOMY Left 07/22/2019   Procedure: DIAYLSIS CATHETER INSERTION;  Surgeon: Katha Cabal, MD;  Location: [place] [facility];  Service: Cardiovascular;  Laterality: Left;  . TRANSMETATARSAL AMPUTATION Right 04/28/2016   Procedure: TRANSMETATARSAL AMPUTATION;  Surgeon: Albertine Patricia, DPM;  Location: [facility];  Service: Podiatry;  Laterality: Right;  . WOUND DEBRIDEMENT Right 04/25/2016   Procedure: DEBRIDEMENT WOUND;  Surgeon: Katha Cabal, MD;  Location: [facility];  Service: Vascular;  Laterality: Right;  . WOUND DEBRIDEMENT Right 12/18/2018   Procedure: DEBRIDEMENT WOUND;  Surgeon: [name], MD;  Location: [facility];  Service: Vascular;  Laterality: Right;  right stump    Social History   Socioeconomic History  . Marital status: Married    Spouse name: Filbert Schilder   . Number of children: 0  . Years of education: Not on file  . Highest education level: Not on  file  Occupational History  . Not on file  Social Needs  . Financial resource strain: Not very hard  . Food insecurity    Worry: Never true    Inability: Never true  . Transportation needs    Medical: No    Non-medical: No  Tobacco Use  . Smoking status: Never Smoker  . Smokeless tobacco: Never Used  Substance and Sexual Activity  . Alcohol use: No  . Drug use: No  . Sexual activity: Not on file  Lifestyle  . Physical activity    Days per week: Not on file    Minutes per session: Not on file  . Stress: Not at all  Relationships  . Social connections    Talks on phone: More than three times a week    Gets together: Three times a week    Attends religious service: Not on file    Active member of club or organization: Not on file    Attends meetings  of clubs or organizations: Not on file    Relationship status: Not on file  . Intimate partner violence    Fear of current or ex partner: No    Emotionally abused: Not on file    Physically abused: Not on file    Forced sexual activity: Not on file  Other Topics Concern  . Not on file  Social History Narrative  . Not on file    Family History  Problem Relation Age of Onset  . Diabetes Father   . Prostate cancer Father     No Known Allergies   Review of Systems   Review of Systems: Negative Unless Checked Constitutional: [] Weight loss  [] Fever  [] Chills Cardiac: [] Chest pain   []  Atrial Fibrillation  [] Palpitations   [] Shortness of breath when laying flat   [] Shortness of breath with exertion. [x] Shortness of breath at rest Vascular:  [] Pain in legs with walking   [] Pain in legs with standing [] Pain in legs when laying flat   [] Claudication    [] Pain in feet when laying flat    [] History of DVT   [] Phlebitis   [] Swelling in legs   [] Varicose veins   [] Non-healing ulcers Pulmonary:   [] Uses home oxygen   [] Productive cough   [] Hemoptysis   [] Wheeze  [] COPD   [] Asthma Neurologic:  [] Dizziness   [] Seizures  [] Blackouts [] History of stroke   [] History of TIA  [] Aphasia   [] Temporary Blindness   [] Weakness or numbness in arm   [x] Weakness or numbness in leg Musculoskeletal:   [] Joint swelling   [] Joint pain   [] Low back pain  []  History of Knee Replacement [] Arthritis [] back Surgeries  []  Spinal Stenosis    Hematologic:  [] Easy bruising  [] Easy bleeding   [] Hypercoagulable state   [] Anemic Gastrointestinal:  [] Diarrhea   [] Vomiting  [] Gastroesophageal reflux/heartburn   [] Difficulty swallowing. [] Abdominal pain Genitourinary:  [] Chronic kidney disease   [] Difficult urination  [] Anuric   [] Blood in urine [] Frequent urination  [] Burning with urination   [] Hematuria Skin:  [] Rashes   [] Ulcers [] Wounds Psychological:  [x] History of anxiety   []  History of major depression  []  Memory  Difficulties      OBJECTIVE:   Physical Exam  BP 114/72 (BP Location: Right Arm)   Pulse 75   Resp 16   Gen: WD/WN, NAD Head: [place]/AT, No temporalis wasting.  Ear/Nose/Throat: Hearing grossly intact, nares w/o erythema or drainage Eyes: PER, EOMI, sclera nonicteric.  Neck: Supple, no masses.  No JVD.  Pulmonary:  Good air movement, no use of accessory muscles.  Cardiac: RRR Vascular:  Good thrill and bruit.  Superficial dehiscence of the patient's surgical incision Vessel Right Left  Radial Palpable Palpable   Gastrointestinal: soft, non-distended. No guarding/no peritoneal signs.  Musculoskeletal: M/S 5/5 throughout.  Bilateral lower extremity amputations  .  neurologic: Pain and light touch intact in extremities.  Symmetrical.  Speech is fluent. Motor exam as listed above. Psychiatric: Judgment intact, Mood & affect appropriate for pt's clinical situation. Dermatologic: No Venous rashes. No Ulcers Noted.  No changes consistent with cellulitis. Lymph : No Cervical lymphadenopathy, no lichenification or skin changes of chronic lymphedema.       ASSESSMENT AND PLAN:  1. Complication of vascular access for dialysis, sequela Per the patient's request we have placed a referral to the wound clinic for his arm.  The patient has refused home health services for wound care.  The patient insist on his wife doing his wound care.  The patient was sent home with Aquacel to place on the wound and advised to change 3 times weekly.  Also advised to cover the Aquacel with gauze to keep area dry.  The patient is also advised that he will not be able to utilize his dialysis access until this wound is healed.  We will have the patient return to the office in 6 weeks to evaluate the wound. - Ambulatory referral to Wound Clinic  2. Type 2 diabetes mellitus with diabetic peripheral angiopathy with gangrene, unspecified whether long term insulin use (HCC) Continue hypoglycemic medications as already  ordered, these medications have been reviewed and there are no changes at this time.  Hgb A1C to be monitored as already arranged by primary service    Current Outpatient Medications on File Prior to Visit  Medication Sig Dispense Refill  . acetaminophen (TYLENOL) 325 MG tablet Take 650 mg by mouth every 6 (six) hours as needed.    Marland Kitchen albuterol (ACCUNEB) 0.63 MG/3ML nebulizer solution Take 3 mLs (0.63 mg total) by nebulization every 4 (four) hours as needed for wheezing. 200 mL 3  . amiodarone (PACERONE) 200 MG tablet Take 1 tablet (200 mg total) by mouth daily. 90 tablet 3  . aspirin EC 81 MG tablet Take 81 mg by mouth daily.    Marland Kitchen b complex-vitamin c-folic acid (NEPHRO-VITE) 0.8 MG TABS tablet Take 1 tablet by mouth daily.    . cinacalcet (SENSIPAR) 60 MG tablet Take 60 mg by mouth daily.     . ferrous sulfate 325 (65 FE) MG EC tablet Take 325 mg by mouth 3 (three) times daily with meals.    . gabapentin (NEURONTIN) 100 MG capsule Take 1 capsule (100 mg total) by mouth 3 (three) times daily. 90 capsule 0  . glipiZIDE (GLUCOTROL) 10 MG tablet Take 10 mg by mouth daily.     Marland Kitchen lactulose (CEPHULAC) 20 g packet Take 1 packet (20 g total) by mouth 3 (three) times daily. (Patient taking differently: Take 20 g by mouth daily as needed. ) 30 each 0  . lidocaine-prilocaine (EMLA) cream Apply 1 application topically as needed (port access).    . midodrine (PROAMATINE) 10 MG tablet Take 1 tablet (10 mg total) by mouth 3 (three) times daily with meals. 30 tablet 0  . Nutritional Supplements (FEEDING SUPPLEMENT, NEPRO CARB STEADY,) LIQD Take 237 mLs by mouth 2 (two) times daily between meals.  0  . omeprazole (PRILOSEC) 40 MG capsule Take 40 mg by mouth 2 (two)  times daily.     . ondansetron (ZOFRAN) 8 MG tablet Take 8 mg by mouth every 8 (eight) hours as needed.     . polyethylene glycol (MIRALAX / GLYCOLAX) packet Take 17 g by mouth 2 (two) times daily.     . sevelamer carbonate (RENVELA) 800 MG tablet  Take 2,400 mg by mouth 3 (three) times daily with meals.     . silver sulfADIAZINE (SILVADENE) 1 % cream Apply 1 application topically daily. 400 g 0  . Wound Dressings (AQUACEL EXTRA HYDROFIBER 6X6) PADS Apply 1 Package topically 3 (three) times a week. 1 each 3  . cephALEXin (KEFLEX) 500 MG capsule Take 1 capsule (500 mg total) by mouth 2 (two) times daily. (Patient not taking: Reported on 07/28/2019) 28 capsule 0  . cephALEXin (KEFLEX) 500 MG capsule Take 1 capsule (500 mg total) by mouth 3 (three) times daily. (Patient not taking: Reported on 07/28/2019) 21 capsule 0   No current facility-administered medications on file prior to visit.     There are no Patient Instructions on file for this visit. No follow-ups on file.   Kris Hartmann, NP  This note was completed with Sales executive.  Any errors are purely unintentional.

## 2019-09-01 ENCOUNTER — Telehealth (INDEPENDENT_AMBULATORY_CARE_PROVIDER_SITE_OTHER): Payer: Self-pay

## 2019-09-01 ENCOUNTER — Encounter: Payer: Medicare Other | Attending: Physician Assistant | Admitting: Physician Assistant

## 2019-09-01 ENCOUNTER — Other Ambulatory Visit: Payer: Self-pay

## 2019-09-01 DIAGNOSIS — Z87891 Personal history of nicotine dependence: Secondary | ICD-10-CM | POA: Diagnosis not present

## 2019-09-01 DIAGNOSIS — N186 End stage renal disease: Secondary | ICD-10-CM | POA: Insufficient documentation

## 2019-09-01 DIAGNOSIS — Z89611 Acquired absence of right leg above knee: Secondary | ICD-10-CM | POA: Insufficient documentation

## 2019-09-01 DIAGNOSIS — E1122 Type 2 diabetes mellitus with diabetic chronic kidney disease: Secondary | ICD-10-CM | POA: Insufficient documentation

## 2019-09-01 DIAGNOSIS — Z7984 Long term (current) use of oral hypoglycemic drugs: Secondary | ICD-10-CM | POA: Insufficient documentation

## 2019-09-01 DIAGNOSIS — T8131XA Disruption of external operation (surgical) wound, not elsewhere classified, initial encounter: Secondary | ICD-10-CM | POA: Insufficient documentation

## 2019-09-01 DIAGNOSIS — I132 Hypertensive heart and chronic kidney disease with heart failure and with stage 5 chronic kidney disease, or end stage renal disease: Secondary | ICD-10-CM | POA: Diagnosis not present

## 2019-09-01 DIAGNOSIS — I480 Paroxysmal atrial fibrillation: Secondary | ICD-10-CM | POA: Diagnosis not present

## 2019-09-01 DIAGNOSIS — I509 Heart failure, unspecified: Secondary | ICD-10-CM | POA: Diagnosis not present

## 2019-09-01 DIAGNOSIS — Z89512 Acquired absence of left leg below knee: Secondary | ICD-10-CM | POA: Insufficient documentation

## 2019-09-01 DIAGNOSIS — R262 Difficulty in walking, not elsewhere classified: Secondary | ICD-10-CM | POA: Insufficient documentation

## 2019-09-01 DIAGNOSIS — Z8673 Personal history of transient ischemic attack (TIA), and cerebral infarction without residual deficits: Secondary | ICD-10-CM | POA: Insufficient documentation

## 2019-09-01 DIAGNOSIS — Z992 Dependence on renal dialysis: Secondary | ICD-10-CM | POA: Insufficient documentation

## 2019-09-01 DIAGNOSIS — Y832 Surgical operation with anastomosis, bypass or graft as the cause of abnormal reaction of the patient, or of later complication, without mention of misadventure at the time of the procedure: Secondary | ICD-10-CM | POA: Diagnosis not present

## 2019-09-01 NOTE — Telephone Encounter (Signed)
Kim from the [facility] is calling wanting a release of care to them,  regarding the patient's wound because he is still in a 90 day global period from surgery.

## 2019-09-01 NOTE — Telephone Encounter (Signed)
Can we clarify if this is a form or if it is some sort of letter that this requires.  Thanks.

## 2019-09-02 NOTE — Telephone Encounter (Signed)
A note has been faxed to the [facility] with release of care for the patient to do wound care treatment.

## 2019-09-03 NOTE — Progress Notes (Addendum)
KADEEN, [name] ([id number]) Visit Report for 09/01/2019 Chief Complaint Document Details Patient Name: [name], [name] Date of Service: 09/01/2019 8:00 AM Medical Record Number: [id number] Patient Account Number: [id number] Date of Birth/Sex: 11/26/1960 (58 y.o. M) Treating RN: [name] Primary Care Provider: [name] Other Clinician: Referring Provider: [name] Treating Provider/Extender: [name], HOYT Weeks in Treatment: 0 Information Obtained from: Patient Chief Complaint Left forearm surgical ulcer Electronic Signature(s) Signed: 09/06/2019 11:18:12 PM By: Worthy Keeler PA-C Previous Signature: 09/01/2019 9:02:12 AM Version By: Worthy Keeler PA-C Entered By: Worthy Keeler on 09/06/2019 23:16:27 [name] ([id number]) -------------------------------------------------------------------------------- Debridement Details Patient Name: [name], [name] Date of Service: 09/01/2019 8:00 AM Medical Record Number: [id number] Patient Account Number: [id number] Date of Birth/Sex: 1961/05/21 (58 y.o. M) Treating RN: [name] Primary Care Provider: [name] Other Clinician: Referring Provider: [name] Treating Provider/Extender: STONE III, HOYT Weeks in Treatment: 0 Debridement Performed for Wound #3 Left,Medial Forearm Assessment: Performed By: Physician STONE III, HOYT E., PA-C Debridement Type: Debridement Level of Consciousness (Pre- Awake and Alert procedure): Pre-procedure Verification/Time Yes - 09:05 Out Taken: Start Time: 09:05 Pain Control: Lidocaine 4% Topical Solution Total Area Debrided (L x W): 1 (cm) x 5.2 (cm) = 5.2 (cm) Tissue and other material Viable, Non-Viable, Eschar, Slough, Subcutaneous, Slough debrided: Level: Skin/Subcutaneous Tissue Debridement Description: Excisional Instrument: Forceps, Scissors Bleeding: Minimum Hemostasis Achieved: Pressure End Time: 09:08 Procedural Pain: 0 Post Procedural  Pain: 0 Response to Treatment: Procedure was tolerated well Level of Consciousness Awake and Alert (Post-procedure): Post Debridement Measurements of Total Wound Length: (cm) 1 Width: (cm) 5.2 Depth: (cm) 1 Volume: (cm) 4.084 Character of Wound/Ulcer Post Debridement: Improved Post Procedure Diagnosis Same as Pre-procedure Electronic Signature(s) Signed: 09/01/2019 4:34:50 PM By: [name] Signed: 09/01/2019 6:03:25 PM By: Worthy Keeler PA-C Entered By: [name] on 09/01/2019 09:09:21 [name] ([id number]) -------------------------------------------------------------------------------- HPI Details Patient Name: [name], [name] Date of Service: 09/01/2019 8:00 AM Medical Record Number: [id number] Patient Account Number: [id number] Date of Birth/Sex: January 18, 1961 (58 y.o. M) Treating RN: [name] Primary Care Provider: [name] Other Clinician: Referring Provider: [name] Treating Provider/Extender: [name], HOYT Weeks in Treatment: 0 History of Present Illness HPI Description: 01/27/19 patient presents today for initial evaluation here in our clinic concerning an issue he's been having with a wound at the right above knee amputation site which began shortly following the surgery in November 2019. 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 He has a history of hypertension, diabetes, congestive heart failure, and states that he is having a lot of discomfort at the wound site unfortunately. There does not appear to be any signs of active infection upon initial inspection today. No fevers, chills, nausea, or vomiting noted at this time.. Silvadene Cream has been utilized at some point on this wound as well. Patient did have home health coming out but  unfortunately the patient and his wife state that they were not happy with the services being rendered and therefore discontinued the service. There does not appear to be any signs of otherwise active infection at this time which is good news. The patient states he's definitely ready for this to heal and has been a symptomatic time since his surgery and he is becoming somewhat frustrated. Iodoflex also  sounds to have previously been used on the wound that is when the patient's wife states the wound seem to get worse not better. As best I can tell [name] has not been utilized. 02/03/19 on evaluation today patient appears to be doing much better compared to last week's evaluation in regard to his ulcer on the right amputation site. He's been tolerating the dressing changes without complication which is good news. The Santyl does seem to be of benefit in fact the wound appears to be doing significantly better compared to my prior evaluation. Overall very pleased in this regard. No fevers, chills, nausea, or vomiting noted at this time. 02/10/19 on evaluation today patient's wound actually appears to be showing signs of excellent improvement at this time which is great news. He has been tolerating the dressing changes without complication overall I feel like that there is no signs of active infection at this point which is also good news. I feel like that the [name] is very beneficial. Patient's wife likewise feels like things are doing quite well and is not having pain like he was in the past. 02/17/19 on evaluation today patient appears to be doing well in regard to the wound on the distal aspect of his right amputation site. He's been tolerating treatment with the Santyl at this point. I think he is nearing completion as far as treatment with cental is concerned I think were very close to getting this significantly better. Fortunately there's no evidence of infection currently. 03/03/19 on evaluation today  patient actually appears to be doing quite well in regard to his his right imputation site ulceration. In fact there's a lot of new skin growth which is excellent news. I'm very pleased in this regard. Fortunately there's no signs of active infection also good news. No fevers, chills, nausea, or vomiting noted at this time. 03/17/19 on evaluation today patient's wound actually appears to be showing signs of improvement which is good news. He does have a lot of issues however with concerns about how slowly this seems to be healing as far as he and his wife are concerned. I do believe some of the issues that were having currently are from the language barrier perspective but even that we have an interpreter that has been present for each evaluation I still think that there is some issues with their overall understanding of the treatment plan and how long wounds of this nature do take to heal. We began seeing him on April 14 and in just over a month and a half he has made improvement to where the wound is one third the size originally began. Nonetheless there still quite a bit of feeling left to do but again he has made great progress up to this point. Unfortunately he did have some issues with the silver collagen that was ordered he received something different from the [facility]. This was Colactive. Unfortunately this actually calls some burning he seemed to have some irritation from the dressing itself that caused irritation and redness around the edge of the wound that seems to be improving but nonetheless he and his wife both believe that the Santyl might've done better. They also really want to try to utilize something that will prevent me having to degree the wound as he typically has discomfort with the debridement obviously. 03/31/19 on evaluation today patient actually appears to be doing better in regard to his lower extremity ulcer. He's been tolerating the dressing changes without complication.  Fortunately there's no  signs of active infection at this time. He has a lot of new skin growth in the Santyl does seem to be working appropriately for him at this time. I'm very pleased in this regard. RAYLAND, GOWDA ([id number]) 04/14/19 on evaluation today patient appears to be doing quite well in regard to his invitation site ulcer. This is a little smaller even than half the size of where it was when I saw him last. Fortunately there is no sign of active infection at this time which is good news. Overall he has been tolerating the dressing changes without complication still states that he has a lot of pain but again I really cannot see anything with regard to the wound that would be causing this and maybe more for neuropathy type pain. He states his weeks and up at night sometimes. 04/28/19-Patient presents at 2 weeks, with the right AKA wound, we have been using Santyl for this and there was mention of changing it to a different dressing this time around. 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 He's been tolerating the dressing changes with the Santyl unfortunately when we switched to the collagen dressing again last time he was seen in the office when I wasn't here his wound bed deteriorated as well in the following days. His wife came in to discuss this with me and subsequently we opted to go back to the [place] which seems to be doing excellent for him at this point. 05/26/2019 upon evaluation today patient actually appears to be doing excellent in regard to his wound on the right amputation site above the knee. In fact this appears to be almost completely healed which is great news. He has been utilizing Santyl. However he does have an issue on his left below-knee amputation stump where he has a wound as well he did not want to show me his wife wanted him  to. In the end he decided he would and we did evaluate that today as well. It is not a very deep wound but nonetheless is something that I think needs to be addressed sooner rather than later to prevent any additional issues at this point. 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 There may still be just a slight area where there some drainage but I really feel like most of the moisture is likely coming from the [place]. With that being said his left BKA ulcer is not doing quite as well there still a lot of Slough noted at this time and again there is some Epiboly as well this would require sharp debridement today. 06/23/2019 on evaluation today patient appears to be doing well with regard to his right ulcer on the distal stump. This actually is almost completely healed although there is a very tiny area still open. Subsequently he is also doing better with regard to the left distal stump location which he has been trying to be very careful about as far as not using his prosthesis anymore than he absolutely has to. I believe this shows and he has done a great job in this regard. 07/07/2019 on evaluation today patient actually appears to be doing well with regard to his amputation site on the right which is completely healed. On the left he is apparently been using his prosthesis way too much and I  think that this has actually caused the area to have a more difficult time healing. Overall I am not is pleased with this although the wound bed has a good base to it there was some slough noted this can require debridement the biggest issue that I see in my opinion is going to be getting him to not use his prosthesis in order to allow this to heal. Readmission: 09/01/2019 patient actually presents today for readmission into the clinic concerning issues that he has been having with a fistula surgery for his left  forearm. He tells me that unfortunately this did not close appropriately and somewhat dehisced they attempted to glue this back together but did not take. Nonetheless he tells me that since that time he has been having really no significant pain but he is concerned about the fact that the wound does not seem to be closing appropriately and obviously this is a fistula in his arm for dialysis access. 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 Overall he is somewhat frustrated at this point with how things appear in regard to the current surgical incision site. Nonetheless we did contact vascular we really have not heard as to whether or not we were taking over care of the wound at this point but nonetheless he is here in the office he was referred to [place] by vascular because home health was not good to be seeing the patient and nonetheless for the time being I am going to go ahead and initiate therapy if that is not what they would like for [place] to do than they can always take over care at a later point. Electronic Signature(s) Signed: 09/06/2019 11:18:12 PM By: Worthy Keeler PA-C Previous Signature: 09/01/2019 5:56:25 PM Version By: Worthy Keeler PA-C Entered By: Worthy Keeler on 09/06/2019 23:16:58 [name] ([id number]) -------------------------------------------------------------------------------- Physical Exam Details Patient Name: Hermance, Keagan L. Date of Service: 09/01/2019 8:00 AM Medical Record Number: [id number] Patient Account Number: [id number] Date of Birth/Sex: 06/29/1961 (58 y.o. M) Treating RN: [name] Primary Care Provider: [name] Other Clinician: Referring Provider: [name] Treating Provider/Extender: STONE III, HOYT Weeks in Treatment: 0 Constitutional patient is hypotensive.. respirations regular, non-labored and within target  range for patient.Marland Kitchen temperature within target range for patient.. Well-nourished and well-hydrated in no acute distress. Eyes conjunctiva clear no eyelid edema noted. pupils equal round and reactive to light and accommodation. Ears, Nose, Mouth, and Throat no gross abnormality of ear auricles or external auditory canals. normal hearing noted during conversation. mucus membranes moist. Respiratory normal breathing without difficulty. clear to auscultation bilaterally. Cardiovascular regular rate and rhythm with normal S1, S2. no clubbing, cyanosis, significant edema, <3 sec cap refill. Gastrointestinal (GI) soft, non-tender, non-distended, +BS. no ventral hernia noted. Musculoskeletal Patient unable to walk. Psychiatric this patient is able to make decisions and demonstrates good insight into disease process. Alert and Oriented x 3. pleasant and cooperative. Notes Upon evaluation today patient did have some necrotic tissue on the superficial portion of the wound that did require some sharp debridement today I mainly just attempted to clear away the necrotic tissue that was mainly eschar and slough that I could gently trim away with scissors and forceps. He actually tolerated this today without complication and post debridement the wound bed actually appeared to be doing much better which is great news.  Overall I do not see any evidence of active infection at this time which is also good news. The patient has a bilateral amputee. This is a left below-knee and right above- knee amputation. Electronic Signature(s) Signed: 09/01/2019 5:57:25 PM By: Worthy Keeler PA-C Entered By: Worthy Keeler on 09/01/2019 17:57:25 [name] ([id number]) -------------------------------------------------------------------------------- Physician Orders Details Patient Name: [name] Date of Service: 09/01/2019 8:00 AM Medical Record Number: [id number] Patient Account Number: [id number] Date  of Birth/Sex: 10-21-1960 (58 y.o. 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 o May shower with protection. - Please do not get your wound wet Primary Wound Dressing Wound #3 Left,Medial Forearm o Santyl Ointment - to wound bed o Xeroform - over santyl Secondary Dressing Wound #3 Left,Medial Forearm o ABD pad - secured with netting Dressing Change Frequency Wound #3 Left,Medial Forearm o Change dressing every day. Follow-up Appointments Wound #3 Left,Medial Forearm o Return Appointment in 1 week. Patient Medications Allergies: No Known Drug Allergies Notifications Medication Indication Start End Santyl 09/03/2019 JARMARCUS, YSAGUIRRE ([id number]) Notifications Medication Indication Start End DOSE 0 - topical 250 unit/gram ointment - ointment topical Apply nickel thick to the wound bed and then cover with a dressing as directed in clinic. Electronic Signature(s) Signed: 09/03/2019 11:32:01 AM By: Worthy Keeler PA-C Previous Signature: 09/01/2019 4:34:50 PM Version By: [name] Previous Signature: 09/01/2019 6:03:25 PM Version By: Worthy Keeler PA-C Entered By: Worthy Keeler on 09/03/2019  11:32:00 [name] ([id number]) -------------------------------------------------------------------------------- Problem List Details Patient Name: Gladue, Cordarrel L. Date of Service: 09/01/2019 8:00 AM Medical Record Number: [id number] Patient Account Number: [id number] Date of Birth/Sex: 1961-01-22 (58 y.o. 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 with fat layer 09/01/2019 No Yes exposed E11.622 Type 2 diabetes mellitus with other skin ulcer 09/01/2019 No Yes Z89.611 Acquired absence of right leg above knee 09/01/2019 No Yes Z89.512 Acquired absence of left leg below knee 09/01/2019 No Yes N18.6 End stage renal disease 09/01/2019 No Yes Z99.2 Dependence on renal dialysis 09/01/2019 No Yes I10 Essential (primary) hypertension 09/01/2019 No Yes I48.0 Paroxysmal atrial fibrillation 09/01/2019 No Yes Inactive Problems Resolved Problems AXIEL, [place] ([id number]) Electronic Signature(s) Signed: 09/01/2019 9:01:55 AM By: Worthy Keeler PA-C Entered By: Worthy Keeler on 09/01/2019 09:01:55 [name] ([id number]) -------------------------------------------------------------------------------- Progress Note Details Patient Name: [name], Heyward L. Date of Service: 09/01/2019 8:00 AM Medical Record Number: [id number] Patient Account Number: [id number] Date of Birth/Sex: [birth year] (58 y.o. 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 He has a history of hypertension, diabetes, congestive heart failure, and states that he is having a lot of discomfort at the wound site unfortunately. There does not appear to be any signs of active infection upon initial inspection today. No fevers, chills, nausea, or vomiting noted at this time.. Silvadene Cream has been utilized at some point on this wound as well. Patient did have home health coming out but unfortunately the patient and his wife state that they were not happy with the services being rendered and therefore discontinued the service. There does not appear to be any signs of otherwise active infection at this time which is good news. The patient states he's definitely ready for this to heal and has been a symptomatic time since his surgery and he is becoming somewhat frustrated. Iodoflex also sounds to have previously been used on the wound that is when the patient's wife states the wound seem to get worse not better. As best I can tell [name] has not been utilized. 02/03/19 on evaluation today patient appears to be doing much better compared to last week's evaluation in regard to his ulcer on the right amputation site. He's been tolerating the dressing changes without complication which is good news. The Santyl does seem to be of benefit in fact the wound appears to be doing significantly better  compared to my prior evaluation. Overall very pleased in this regard. No fevers, chills, nausea, or vomiting noted at this time. 02/10/19 on evaluation today patient's wound actually appears to be showing signs of excellent improvement at this time which is great news. He has been tolerating the dressing changes without complication overall I feel like that there is no signs of active infection at this point which is also good news. I feel like that the [name] is very beneficial. Patient's wife likewise feels like things are doing quite well and is not having pain like he was in the past. 02/17/19 on evaluation today patient appears to be doing well in regard to the wound on the distal aspect of his right amputation site. He's been tolerating treatment with the Santyl at this point. I think he is nearing completion as far as treatment with cental is concerned I think were very close to getting this significantly better. Fortunately there's no evidence of infection currently. 03/03/19 on evaluation today patient actually appears to be doing quite well in regard to his his right imputation site ulceration. In fact there's a lot of new skin growth which is excellent news. I'm very pleased in this regard. Fortunately there's no signs of active infection also good news. No fevers, chills, nausea, or vomiting noted at this time. 03/17/19 on evaluation today patient's wound actually appears to be showing signs of improvement which is good news. He does have a lot of issues however with concerns about how slowly this seems to be healing as far as  he and his wife are concerned. I do believe some of the issues that were having currently are from the language barrier perspective but even that we have an interpreter that has been present for each evaluation I still think that there is some issues with their overall understanding of the treatment plan and how long wounds of this nature do take to heal. We began seeing  him on April 14 and in just over a month and a half he has made improvement to where the wound is one third the size originally began. Nonetheless there still quite a bit of feeling left to do but again he has made great progress up to this point. Unfortunately he did have some issues with the silver collagen that was ordered he received something different from the [facility]. This was Colactive. Unfortunately this actually calls some burning he seemed to have some irritation from the dressing itself that caused irritation and redness around the edge of the wound that seems to be improving but nonetheless he and his wife both DIENER, Tavarion L. ([id number]) believe that the Santyl might've done better. They also really want to try to utilize something that will prevent me having to degree the wound as he typically has discomfort with the debridement obviously. 03/31/19 on evaluation today patient actually appears to be doing better in regard to his lower extremity ulcer. He's been tolerating the dressing changes without complication. Fortunately there's no signs of active infection at this time. He has a lot of new skin growth in the Santyl does seem to be working appropriately for him at this time. I'm very pleased in this regard. 04/14/19 on evaluation today patient appears to be doing quite well in regard to his invitation site ulcer. This is a little smaller even than half the size of where it was when I saw him last. Fortunately there is no sign of active infection at this time which is good news. Overall he has been tolerating the dressing changes without complication still states that he has a lot of pain but again I really cannot see anything with regard to the wound that would be causing this and maybe more for neuropathy type pain. He states his weeks and up at night sometimes. 04/28/19-Patient presents at 2 weeks, with the right AKA wound, we have been using Santyl for this and there was mention  of changing it to a different dressing this time around. 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 He's been tolerating the dressing changes with the Santyl unfortunately when we switched to the collagen dressing again last time he was seen in the office when I wasn't here his wound bed deteriorated as well in the following days. His wife came in to discuss this with me and subsequently we opted to go back to the [place] which seems to be doing excellent for him at this point. 05/26/2019 upon evaluation today patient actually appears to be doing excellent in regard to his wound on the right amputation site above the knee. In fact this appears to be almost completely healed which is great news. He has been utilizing Santyl. However he does have an issue on his left below-knee amputation stump where he has a wound as well he did not want to show me his wife wanted him to. In the end he decided he would and we did evaluate  that today as well. It is not a very deep wound but nonetheless is something that I think needs to be addressed sooner rather than later to prevent any additional issues at this point. 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 There may still be just a slight area where there some drainage but I really feel like most of the moisture is likely coming from the [place]. With that being said his left BKA ulcer is not doing quite as well there still a lot of Slough noted at this time and again there is some Epiboly as well this would require sharp debridement today. 06/23/2019 on evaluation today patient appears to be doing well with regard to his right ulcer on the distal stump. This actually is almost completely healed although there is a very tiny area still open.  Subsequently he is also doing better with regard to the left distal stump location which he has been trying to be very careful about as far as not using his prosthesis anymore than he absolutely has to. I believe this shows and he has done a great job in this regard. 07/07/2019 on evaluation today patient actually appears to be doing well with regard to his amputation site on the right which is completely healed. On the left he is apparently been using his prosthesis way too much and I think that this has actually caused the area to have a more difficult time healing. Overall I am not is pleased with this although the wound bed has a good base to it there was some slough noted this can require debridement the biggest issue that I see in my opinion is going to be getting him to not use his prosthesis in order to allow this to heal. Readmission: 09/01/2019 patient actually presents today for readmission into the clinic concerning issues that he has been having with a fistula surgery for his left forearm. He tells me that unfortunately this did not close appropriately and somewhat dehisced they attempted to glue this back together but did not take. Nonetheless he tells me that since that time he has been having really no significant pain but he is concerned about the fact that the wound does not seem to be closing appropriately and obviously this is a fistula in his arm for dialysis access. 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 Overall he is somewhat frustrated at this point with how things appear in regard to the current surgical incision site. Nonetheless we did contact vascular we really have not heard as to whether or not we were taking over care of the wound at this point but nonetheless he is here in the office he was referred to [place] by vascular because home health was not good  to be seeing the patient and nonetheless for the time being I am going to go ahead and initiate therapy if that is not what they would like for [place] to do than they can always take over care at a later point. Patient History ZALYN, SANNICOLAS ([id number]) Information obtained from Patient. Allergies No Known Drug Allergies Family History Cancer - Father, Diabetes - Siblings, No family history of Hereditary Spherocytosis. Social History Former smoker - ended on 10/16/1994, Marital Status - Married, Alcohol Use - Never, Drug Use - No  History, Caffeine Use - Rarely. Medical History Cardiovascular Patient has history of Arrhythmia - A-fib, Congestive Heart Failure, Hypertension, Peripheral Arterial Disease - Bilateral LE amputations Endocrine Patient has history of Type II Diabetes Genitourinary Patient has history of End Stage Renal Disease - Dialysis M,W,F Hospitalization/Surgery History - Right AKA. - Left BKA. 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 respirations regular, non-labored and within target range for patient.Marland Kitchen temperature within target range for patient.. Well-nourished and well-hydrated in no acute distress. Vitals Time Taken: 8:41 AM, Height: 67 in, Weight: 212 lbs, BMI: 33.2, Temperature: 98.4 F, Pulse: 70 bpm, Respiratory Rate: 16 breaths/min, Blood Pressure: 97/54 mmHg. Eyes conjunctiva clear no eyelid edema noted. pupils equal round and reactive to light and accommodation. Ears, Nose, Mouth, and Throat no gross abnormality of ear auricles or external auditory canals. normal hearing noted during conversation. mucus membranes moist. Respiratory normal breathing without difficulty. clear to auscultation bilaterally. CARLEN, KAHANE L. ([id number]) Cardiovascular regular rate and rhythm with normal S1, S2. no  clubbing, cyanosis, significant edema, Gastrointestinal (GI) soft, non-tender, non-distended, +BS. no ventral hernia noted. Musculoskeletal Patient unable to walk. Psychiatric this patient is able to make decisions and demonstrates good insight into disease process. Alert and Oriented x 3. pleasant and cooperative. General Notes: Upon evaluation today patient did have some necrotic tissue on the superficial portion of the wound that did require some sharp debridement today I mainly just attempted to clear away the necrotic tissue that was mainly eschar and slough that I could gently trim away with scissors and forceps. He actually tolerated this today without complication and post debridement the wound bed actually appeared to be doing much better which is great news. Overall I do not see any evidence of active infection at this time which is also good news. The patient has a bilateral amputee. This is a left below- knee and right above-knee amputation. Integumentary (Hair, Skin) Wound #3 status is Open. Original cause of wound was Surgical Injury. The wound is located on the Left,Medial Forearm. The wound measures 1cm length x 5.2cm width x 0.9cm depth; 4.084cm^2 area and 3.676cm^3 volume. There is Fat Layer (Subcutaneous Tissue) Exposed exposed. There is no tunneling or undermining noted. There is a medium amount of serous drainage noted. The wound margin is flat and intact. There is no granulation within the wound bed. There is a large (67-100%) amount of necrotic tissue within the wound bed including Eschar and Adherent Slough. 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 There was a Excisional Skin/Subcutaneous Tissue Debridement with a total area of 5.2 sq cm performed by STONE III, HOYT E., PA-C. With the following instrument(s): Forceps, and Scissors to remove Viable and Non-Viable tissue/material. Material removed includes Eschar, Subcutaneous Tissue, and Slough after achieving pain control using Lidocaine 4% Topical Solution. No specimens were taken. A time out was conducted at 09:05, prior to the start of the procedure. A Minimum amount of bleeding was Chohan, Adolpho  L. ([id number]) controlled with Pressure. The procedure was tolerated well with a pain level of 0 throughout and a pain level of 0 following the procedure. Post Debridement Measurements: 1cm length x 5.2cm width x 1cm depth; 4.084cm^3 volume. Character of Wound/Ulcer Post Debridement is improved. Post procedure Diagnosis Wound #3: Same as Pre-Procedure Plan Wound Cleansing: Wound #3 Left,Medial Forearm: Clean wound with Normal Saline. May shower with protection. - Please do not get your wound wet Primary Wound Dressing: Wound #3 Left,Medial Forearm: Santyl Ointment - to wound bed Xeroform - over santyl Secondary Dressing: Wound #3 Left,Medial Forearm: ABD pad - secured with netting Dressing Change Frequency: Wound #3 Left,Medial Forearm: Change dressing every day. Follow-up Appointments: Wound #3 Left,Medial Forearm: Return Appointment in 1 week. The following medication(s) was prescribed: Santyl topical 250 unit/gram ointment 0 ointment topical Apply nickel thick to the wound bed and then cover with a dressing as directed in clinic. starting 09/03/2019 1. Based on what I was seeing today I did gently trim away some of the necrotic tissue to aid in wound healing. There  is actually a deeper portion of this wound that does have me somewhat concerned being that this is a surgical incision site from a fistula access point that was established by vascular. Nonetheless I think that they really need to have a look at this sooner than December 29 which is apparently the stated follow-up time with Dr. Lazaro Arms. We will get a see about get an appointment sooner. 2. I am also going to suggest at this time based on what I am seeing that the best way to debride this wound to try to help it heal would be to use Santyl which I think Santyl ointment is good to be optimal. We do not want this to dry out so we will use Xeroform over top of that. 3. We will cover the Xeroform and ABD pad and secure with netting to help pad the area and also recommended the patient should wear her sling in order to avoid movement in the arm so that this has a better chance to heal without causing complication. We will see patient back for reevaluation in 1 week here in the clinic. If anything worsens or changes patient will contact our office for additional recommendations. Electronic Signature(s) Signed: 09/06/2019 11:18:12 PM By: Worthy Keeler PA-C Previous Signature: 09/03/2019 11:32:10 AM Version By: Worthy Keeler PA-C Previous Signature: 09/01/2019 5:58:35 PM Version By: Worthy Keeler PA-C Entered By: Worthy Keeler on 09/06/2019 23:17:18 [name] ([id number]) [name] ([id number]) -------------------------------------------------------------------------------- ROS/PFSH Details Patient Name: [name], Tilford L. Date of Service: 09/01/2019 8:00 AM Medical Record Number: [id number] Patient Account Number: [id number] Date of Birth/Sex: Jan 07, 1961 (58 y.o. 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 Cardiovascular Medical History: Positive for: Arrhythmia - A-fib; Congestive Heart Failure; Hypertension; Peripheral Arterial Disease - Bilateral LE amputations Endocrine Medical History: Positive for: Type II Diabetes Time with diabetes: 14 years Treated with: Oral agents Blood sugar tested every day: No Blood sugar testing results: Breakfast: 128; Lunch: 145 Genitourinary Medical History: Positive for: End Stage Renal Disease - Dialysis M,W,F Immunizations Pneumococcal Vaccine: Received Pneumococcal Vaccination: Yes Implantable Devices None Hospitalization / Surgery History Type of Hospitalization/Surgery  Right AKA Left BKA Family and Social History Cancer: Yes - Father; Diabetes: Yes - Siblings; Hereditary Spherocytosis: No; Former smoker - ended on 10/16/1994; Marital Status - Married; Alcohol Use: Never; Drug Use: No History; Caffeine Use: Rarely ALVOID, COYT ([id number]) Electronic Signature(s) Signed: 09/01/2019 6:03:25 PM By: Worthy Keeler PA-C Signed: 09/03/2019 1:15:06 PM By: Gretta Cool, BSN, RN, CWS, Kim RN, BSN Entered By: Gretta Cool, BSN, RN, CWS, Kim on 09/01/2019 08:43:39 [name] ([id number]) -------------------------------------------------------------------------------- [place] Details Patient Name: [name], Milano L. Date of Service: 09/01/2019 Medical Record Number: [id number] Patient Account Number: [id number] Date of Birth/Sex: 08/14/1961 (58 y.o. M) Treating RN: [name] Primary Care Provider: [name] Other Clinician: Referring Provider: [name] Treating Provider/Extender: [name], HOYT Weeks in Treatment: 0 Diagnosis Coding ICD-10 Codes Code Description T81.31XA Disruption of external operation (surgical) wound, not elsewhere classified, initial  encounter L98.492 Non-pressure chronic ulcer of skin of other sites with fat layer exposed E11.622 Type 2 diabetes mellitus with other skin ulcer Z89.611 Acquired absence of right leg above knee Z89.512 Acquired absence of left leg below knee N18.6 End stage renal disease Z99.2 Dependence on renal dialysis I10 Essential (primary) hypertension I48.0 Paroxysmal atrial fibrillation Facility Procedures CPT4: Description Modifier Quantity Code [id number] - WOUND CARE VISIT-LEV 3 EST PT 1 CPT4: [id number] - DEB SUBQ TISSUE 20 SQ CM/< 1 ICD-10 Diagnosis Description L98.492 Non-pressure chronic ulcer of skin of other sites with fat layer exposed T81.31XA Disruption of external operation (surgical) wound, not elsewhere classified,  initial encounter Physician Procedures CPT4: Description Modifier Quantity Code [id number] - WC PHYS LEVEL 4 - EST PT 25 1 ICD-10 Diagnosis Description T81.31XA Disruption of external operation (surgical) wound, not elsewhere classified, initial encounter L98.492 Non-pressure chronic ulcer  of skin of other sites with fat layer exposed E11.622 Type 2 diabetes mellitus with other skin ulcer Z89.611 Acquired absence of right leg above knee CPT4: [id number] - WC PHYS SUBQ TISS 20 SQ CM 1 ICD-10 Diagnosis Description L98.492 Non-pressure chronic ulcer of skin of other sites with fat layer exposed T81.31XA Disruption of external operation (surgical) wound, not elsewhere classified, initial  encounter REINHOLD, ATKERSON ([id number]) Electronic Signature(s) Signed: 09/01/2019 5:58:57 PM By: Worthy Keeler PA-C Entered By: Worthy Keeler on 09/01/2019 17:58:56

## 2019-09-03 NOTE — Progress Notes (Addendum)
VYTAUTAS, BARBIERI ([id number]) Visit Report for 09/01/2019 Allergy List Details Patient Name: [name], [name] Date of Service: 09/01/2019 8:00 AM Medical Record Number: [id number] Patient Account Number: [id number] Date of Birth/Sex: [birth year] (57 y.o. M) Treating RN: Cornell Barman Primary Care Kasee Hantz: [name] Other Clinician: Referring Alicia Seib: [name] Treating Semaj Coburn/Extender: [name], HOYT Weeks in Treatment: 0 Allergies Active Allergies No Known Drug Allergies Allergy Notes Electronic Signature(s) Signed: 09/03/2019 1:15:06 PM By: Gretta Cool, BSN, RN, CWS, Kim RN, BSN Entered By: Gretta Cool, BSN, RN, CWS, Kim on 09/01/2019 08:42:52 [name] ([id number]) -------------------------------------------------------------------------------- Arrival Information Details Patient Name: [name], [name] Date of Service: 09/01/2019 8:00 AM Medical Record Number: [id number] Patient Account Number: [id number] Date of Birth/Sex: [birth year] (57 y.o. M) Treating RN: Army Melia Primary Care Paytin Ramakrishnan: [name] Other Clinician: Referring Jazzalynn Rhudy: [name] Treating Tadarius Maland/Extender: [name], HOYT Weeks in Treatment: 0 Visit Information Patient Arrived: Wheel Chair Arrival Time: 08:12 Accompanied By: wife Transfer Assistance: None Patient Identification Verified: Yes History Since Last Visit Added or deleted any medications: No Any new allergies or adverse reactions: No Had a fall or experienced change in activities of daily living that may affect risk of falls: No Signs or symptoms of abuse/neglect since last visito No Hospitalized since last visit: No Has Dressing in Place as Prescribed: Yes Electronic Signature(s) Signed: 09/01/2019 11:27:33 AM By: Army Melia Entered By: Army Melia on 09/01/2019 08:13:10 [name] ([id number]) -------------------------------------------------------------------------------- Clinic Level of Care Assessment  Details Patient Name: [name] Date of Service: 09/01/2019 8:00 AM Medical Record Number: [id number] Patient Account Number: [id number] Date of Birth/Sex: 02/03/1961 (57 y.o. M) Treating RN: [name] Primary Care Wadell Craddock: [name] Other Clinician: Referring Oakley Orban: [name] Treating Gray Doering/Extender: STONE III, HOYT Weeks in Treatment: 0 Clinic Level of Care Assessment Items TOOL 1 Quantity Score []  - Use when EandM and Procedure is performed on INITIAL visit 0 ASSESSMENTS - Nursing Assessment / Reassessment X - General Physical Exam (combine w/ comprehensive assessment (listed just below) when 1 20 performed on new pt. evals) X- 1 25 Comprehensive Assessment (HX, ROS, Risk Assessments, Wounds Hx, etc.) ASSESSMENTS - Wound and Skin Assessment / Reassessment []  - Dermatologic / Skin Assessment (not related to wound area) 0 ASSESSMENTS - Ostomy and/or Continence Assessment and Care []  - Incontinence Assessment and Management 0 []  - 0 Ostomy Care Assessment and Management (repouching, etc.) PROCESS - Coordination of Care X - Simple Patient / Family Education for ongoing care 1 15 []  - 0 Complex (extensive) Patient / Family Education for ongoing care X- 1 10 Staff obtains Programmer, systems, Records, Test Results / Process Orders []  - 0 Staff telephones HHA, Nursing Homes / Clarify orders / etc []  - 0 Routine Transfer to another Facility (non-emergent condition) []  - 0 Routine Hospital Admission (non-emergent condition) X- 1 15 New Admissions / Biomedical engineer / Ordering NPWT, Apligraf, etc. []  - 0 Emergency Hospital Admission (emergent condition) PROCESS - Special Needs []  - Pediatric / Minor Patient Management 0 []  - 0 Isolation Patient Management []  - 0 Hearing / Language / Visual special needs []  - 0 Assessment of Community assistance (transportation, D/C planning, etc.) []  - 0 Additional assistance / Altered mentation []  - 0 Support  Surface(s) Assessment (bed, cushion, seat, etc.) [name], [name] ([id number]) INTERVENTIONS - Miscellaneous []  - External ear exam 0 []  - 0 Patient Transfer (multiple staff / Civil Service fast streamer / Similar devices) []  - 0 Simple Staple / Suture removal (25 or less) []  -  0 Complex Staple / Suture removal (26 or more) []  - 0 Hypo/Hyperglycemic Management (do not check if billed separately) []  - 0 Ankle / Brachial Index (ABI) - do not check if billed separately Has the patient been seen at the hospital within the last three years: Yes Total Score: 85 Level Of Care: New/Established - Level 3 Electronic Signature(s) Signed: 09/01/2019 4:34:50 PM By: [name] Entered By: [name] on 09/01/2019 09:13:13 [name] ([id number]) -------------------------------------------------------------------------------- Encounter Discharge Information Details Patient Name: [name], [name] Date of Service: 09/01/2019 8:00 AM Medical Record Number: [id number] Patient Account Number: [id number] Date of Birth/Sex: 1961-04-03 (57 y.o. M) Treating RN: [name] Primary Care Nyshaun Standage: [name] Other Clinician: Referring Kirkland Figg: [name] Treating Satoru Milich/Extender: [name], HOYT Weeks in Treatment: 0 Encounter Discharge Information Items Post Procedure Vitals Discharge Condition: Stable Temperature (F): 98.4 Ambulatory Status: Wheelchair Pulse (bpm): 70 Discharge Destination: Home Respiratory Rate (breaths/min): 16 Transportation: Private Auto Blood Pressure (mmHg): 97/54 Accompanied By: spouse Schedule Follow-up Appointment: Yes Clinical Summary of Care: Electronic Signature(s) Signed: 09/01/2019 4:34:50 PM By: [name] Entered By: [name] on 09/01/2019 09:14:44 [name] ([id number]) -------------------------------------------------------------------------------- Lower Extremity Assessment Details Patient Name: [name], [name] Date of Service:  09/01/2019 8:00 AM Medical Record Number: [id number] Patient Account Number: [id number] Date of Birth/Sex: 06-Apr-1961 (57 y.o. M) Treating RN: Cornell Barman Primary Care Evanne Matsunaga: [name] Other Clinician: Referring Amari Burnsworth: [name] Treating Dynasia Kercheval/Extender: [name], HOYT Weeks in Treatment: 0 Electronic Signature(s) Signed: 09/03/2019 1:15:06 PM By: Gretta Cool, BSN, RN, CWS, Kim RN, BSN Entered By: Gretta Cool, BSN, RN, CWS, Kim on 09/01/2019 08:49:55 [name] ([id number]) -------------------------------------------------------------------------------- Multi Wound Chart Details Patient Name: [name], [name] Date of Service: 09/01/2019 8:00 AM Medical Record Number: [id number] Patient Account Number: [id number] Date of Birth/Sex: Apr 21, 1961 (57 y.o. M) Treating RN: [name] Primary Care Jariya Reichow: [name] Other Clinician: Referring Garhett Bernhard: [name] Treating Kari Kerth/Extender: STONE III, HOYT Weeks in Treatment: 0 Vital Signs Height(in): 23 Pulse(bpm): 70 Weight(lbs): 212 Blood Pressure(mmHg): 97/54 Body Mass Index(BMI): 33 Temperature(F): 98.4 Respiratory Rate 16 (breaths/min): Photos: [N/A:N/A] Wound Location: Left Forearm - Medial N/A N/A Wounding Event: Surgical Injury N/A N/A Primary Etiology: Dehisced Wound N/A N/A Comorbid History: Arrhythmia, Congestive Heart N/A N/A Failure, Hypertension, Peripheral Arterial Disease, Type II Diabetes, End Stage Renal Disease Date Acquired: 07/30/2019 N/A N/A Weeks of Treatment: 0 N/A N/A Wound Status: Open N/A N/A Measurements L x W x D 1x5.2x0.9 N/A N/A (cm) Area (cm) : 4.084 N/A N/A Volume (cm) : 3.676 N/A N/A % Reduction in Area: 0.00% N/A N/A % Reduction in Volume: 0.00% N/A N/A Classification: Full Thickness Without N/A N/A Exposed Support Structures Exudate Amount: Medium N/A N/A Exudate Type: Serous N/A N/A Exudate Color: amber N/A N/A Wound Margin: Flat and Intact N/A  N/A Granulation Amount: None Present (0%) N/A N/A Necrotic Amount: Large (67-100%) N/A N/A Necrotic Tissue: Eschar, Adherent Slough N/A N/A Exposed Structures: Fat Layer (Subcutaneous N/A N/A Tissue) Exposed: Yes Epithelialization: None N/A N/A KEANEN, THIESEN. ([id number]) Debridement: Debridement - Excisional N/A N/A Pre-procedure 09:05 N/A N/A Verification/Time Out Taken: Pain Control: Lidocaine 4% Topical Solution N/A N/A Tissue Debrided: Necrotic/Eschar, N/A N/A Subcutaneous, Slough Level: Skin/Subcutaneous Tissue N/A N/A Debridement Area (sq cm): 5.2 N/A N/A Instrument: Forceps, Scissors N/A N/A Bleeding: Minimum N/A N/A Hemostasis Achieved: Pressure N/A N/A Procedural Pain: 0 N/A N/A Post Procedural Pain: 0 N/A N/A Debridement Treatment Procedure was tolerated well N/A N/A Response: Post Debridement 1x5.2x1 N/A N/A  Measurements L x W x D (cm) Post Debridement Volume: 4.084 N/A N/A (cm) Procedures Performed: Debridement N/A N/A Treatment Notes Wound #3 (Left, Medial Forearm) Notes santyl, xeroform, abd and netting Electronic Signature(s) Signed: 09/01/2019 4:34:50 PM By: [name] Entered By: [name] on 09/01/2019 09:21:53 [name] ([id number]) -------------------------------------------------------------------------------- [place] Details Patient Name: [name], [name] Date of Service: 09/01/2019 8:00 AM Medical Record Number: [id number] Patient Account Number: [id number] Date of Birth/Sex: July 29, 1961 (57 y.o. M) Treating RN: [name] Primary Care Amariona Rathje: [name] Other Clinician: Referring Kacen Mellinger: [name] Treating Fey Coghill/Extender: [name], HOYT Weeks in Treatment: 0 Active Inactive Abuse / Safety / Falls / Self Care Management Nursing Diagnoses: Potential for falls Goals: Patient will not experience any injury related to falls Date Initiated: 09/01/2019 Target Resolution Date: 11/21/2019 Goal  Status: Active Interventions: Assess fall risk on admission and as needed Notes: Nutrition Nursing Diagnoses: Potential for alteratiion in Nutrition/Potential for imbalanced nutrition Goals: Patient/caregiver agrees to and verbalizes understanding of need to use nutritional supplements and/or vitamins as prescribed Date Initiated: 09/01/2019 Target Resolution Date: 11/21/2019 Goal Status: Active Interventions: Assess patient nutrition upon admission and as needed per policy Notes: Orientation to the Wound Care Program Nursing Diagnoses: Knowledge deficit related to the wound healing center program Goals: Patient/caregiver will verbalize understanding of the [facility] [facility] Date Initiated: 09/01/2019 Target Resolution Date: 11/21/2019 Goal Status: Active Interventions: Provide education on orientation to the wound center Bear [name], [name] ([id number]) Notes: Wound/Skin Impairment Nursing Diagnoses: Impaired tissue integrity Goals: Ulcer/skin breakdown will heal within 14 weeks Date Initiated: 09/01/2019 Target Resolution Date: 11/21/2019 Goal Status: Active Interventions: Assess patient/caregiver ability to obtain necessary supplies Assess patient/caregiver ability to perform ulcer/skin care regimen upon admission and as needed Assess ulceration(s) every visit Notes: Electronic Signature(s) Signed: 09/01/2019 4:34:50 PM By: [name] Entered By: [name] on 09/01/2019 09:05:11 [name] ([id number]) -------------------------------------------------------------------------------- Pain Assessment Details Patient Name: [name], [name] Date of Service: 09/01/2019 8:00 AM Medical Record Number: [id number] Patient Account Number: [id number] Date of Birth/Sex: April 21, 1961 (57 y.o. M) Treating RN: Army Melia Primary Care Demetric Parslow: [name] Other Clinician: Referring Alyzza Andringa: [name] Treating Chrisy Hillebrand/Extender: STONE III, HOYT Weeks in  Treatment: 0 Active Problems Location of Pain Severity and Description of Pain Patient Has Paino No Site Locations Pain Management and Medication Current Pain Management: Electronic Signature(s) Signed: 09/01/2019 11:27:33 AM By: Army Melia Entered By: Army Melia on 09/01/2019 08:13:17 [name] ([id number]) -------------------------------------------------------------------------------- Patient/Caregiver Education Details Patient Name: [name], [name] Date of Service: 09/01/2019 8:00 AM Medical Record Number: [id number] Patient Account Number: [id number] Date of Birth/Gender: August 02, 1961 (57 y.o. M) Treating RN: [name] Primary Care Physician: [name] Other Clinician: Referring Physician: [name] Treating Physician/Extender: [name], HOYT Weeks in Treatment: 0 Education Assessment Education Provided To: Patient and Caregiver Education Topics Provided Wound/Skin Impairment: Handouts: Other: wound care and limited movement of arm Methods: Demonstration, Explain/Verbal Responses: State content correctly Electronic Signature(s) Signed: 09/01/2019 4:34:50 PM By: [name] Entered By: [name] on 09/01/2019 09:13:35 Shull, [name] ([id number]) -------------------------------------------------------------------------------- Wound Assessment Details Patient Name: [name], [name] Date of Service: 09/01/2019 8:00 AM Medical Record Number: [id number] Patient Account Number: [id number] Date of Birth/Sex: October 28, 1960 (57 y.o. M) Treating RN: Cornell Barman Primary Care Ritesh Opara: [name] Other Clinician: Referring Milfred Krammes: [name] Treating Khloey Chern/Extender: STONE III, HOYT Weeks in Treatment: 0 Wound Status Wound Number: 3 Primary Dehisced Wound Etiology: Wound Location: Left Forearm - Medial Wound Open Wounding  Event: Surgical Injury Status: Date Acquired: 07/30/2019 Comorbid Arrhythmia, Congestive Heart Failure, Weeks Of  Treatment: 0 History: Hypertension, Peripheral Arterial Disease, Type Clustered Wound: No II Diabetes, End Stage Renal Disease Photos Wound Measurements Length: (cm) 1 Width: (cm) 5.2 Depth: (cm) 0.9 Area: (cm) 4.084 Volume: (cm) 3.676 % Reduction in Area: 0% % Reduction in Volume: 0% Epithelialization: None Tunneling: No Undermining: No Wound Description Full Thickness Without Exposed Support Foul Od Classification: Structures Slough/ Wound Margin: Flat and Intact Exudate Medium Amount: Exudate Type: Serous Exudate Color: amber or After Cleansing: No Fibrino No Wound Bed Granulation Amount: None Present (0%) Exposed Structure Necrotic Amount: Large (67-100%) Fat Layer (Subcutaneous Tissue) Exposed: Yes Necrotic Quality: Eschar, Adherent Slough Treatment Notes Wound #3 (Left, Medial Forearm) [name], [name] ([id number]) Notes santyl, xeroform, abd and netting Electronic Signature(s) Signed: 09/01/2019 4:34:50 PM By: [name] Signed: 09/03/2019 1:15:06 PM By: Gretta Cool, BSN, RN, CWS, Kim RN, BSN Entered By: [name] on 09/01/2019 09:21:40 [name] ([id number]) -------------------------------------------------------------------------------- [place] Details Patient Name: [name], [name] Date of Service: 09/01/2019 8:00 AM Medical Record Number: [id number] Patient Account Number: [id number] Date of Birth/Sex: May 15, 1961 (57 y.o. M) Treating RN: Cornell Barman Primary Care Porcia Morganti: [name] Other Clinician: Referring Latavious Bitter: [name] Treating Markesia Crilly/Extender: [name], HOYT Weeks in Treatment: 0 Vital Signs Time Taken: 08:41 Temperature (F): 98.4 Height (in): 67 Pulse (bpm): 70 Weight (lbs): 212 Respiratory Rate (breaths/min): 16 Body Mass Index (BMI): 33.2 Blood Pressure (mmHg): 97/54 Reference Range: 80 - 120 mg / dl Electronic Signature(s) Signed: 09/03/2019 1:15:06 PM By: Gretta Cool, BSN, RN, CWS, Kim RN, BSN Entered By: Gretta Cool,  BSN, RN, CWS, Kim on 09/01/2019 08:42:29

## 2019-09-03 NOTE — Progress Notes (Signed)
KWAMEL, REGEN ([id number]) Visit Report for 09/01/2019 Abuse/Suicide Risk Screen Details Patient Name: [name], [name] Date of Service: 09/01/2019 8:00 AM Medical Record Number: [id number] Patient Account Number: [id number] Date of Birth/Sex: May 14, 1961 (57 y.o. M) Treating RN: Cornell Barman Primary Care Errica Dutil: [name] Other Clinician: Referring Larue Lightner: [name] Treating Rolande Moe/Extender: STONE III, HOYT Weeks in Treatment: 0 Abuse/Suicide Risk Screen Items Answer ABUSE RISK SCREEN: Has anyone close to you tried to hurt or harm you recentlyo No Do you feel uncomfortable with anyone in your familyo No Has anyone forced you do things that you didnot want to doo No Electronic Signature(s) Signed: 09/03/2019 1:15:06 PM By: Gretta Cool, BSN, RN, CWS, Kim RN, BSN Entered By: Gretta Cool, BSN, RN, CWS, Kim on 09/01/2019 08:43:52 [name] ([id number]) -------------------------------------------------------------------------------- Activities of Daily Living Details Patient Name: [name], [name] Date of Service: 09/01/2019 8:00 AM Medical Record Number: [id number] Patient Account Number: [id number] Date of Birth/Sex: 12/28/1960 (57 y.o. M) Treating RN: Cornell Barman Primary Care Maxey Ransom: [name] Other Clinician: Referring Numan Zylstra: [name] Treating Anushri Casalino/Extender: [name], HOYT Weeks in Treatment: 0 Activities of Daily Living Items Answer Activities of Daily Living (Please select one for each item) Drive Automobile Not Able Take Medications Completely Able Use Telephone Completely Able Care for Appearance Completely Able Use Toilet Completely Able Bath / Shower Completely Able Dress Self Completely Able Feed Self Completely Able Walk Completely Able Get In / Out Bed Completely Able Housework Completely Able Prepare Meals Completely Able Handle Money Completely Able Shop for Self Completely Able Electronic Signature(s) Signed: 09/03/2019 1:15:06 PM By:  Gretta Cool, BSN, RN, CWS, Kim RN, BSN Entered By: Gretta Cool, BSN, RN, CWS, Kim on 09/01/2019 08:44:21 [name] ([id number]) -------------------------------------------------------------------------------- Education Screening Details Patient Name: [name], [name] Date of Service: 09/01/2019 8:00 AM Medical Record Number: [id number] Patient Account Number: [id number] Date of Birth/Sex: 1961/07/18 (57 y.o. M) Treating RN: Cornell Barman Primary Care Elijah Phommachanh: [name] Other Clinician: Referring Makana Feigel: [name] Treating Baelyn Doring/Extender: [name], HOYT Weeks in Treatment: 0 Primary Learner Assessed: Patient Learning Preferences/Education Level/Primary Language Learning Preference: Explanation, Demonstration Preferred Language: Spanish; Castilian Cognitive Barrier Language Barrier: Psychologist, occupational Needed: Yes Hospital Employed Language Interpreter Memory Deficit: No Emotional Barrier: No Cultural/Religious Beliefs Affecting Medical Care: No Physical Barrier Impaired Vision: No Impaired Hearing: No Decreased Hand dexterity: No Knowledge/Comprehension Knowledge Level: High Comprehension Level: High Ability to understand written High instructions: Ability to understand verbal High instructions: Motivation Anxiety Level: Calm Cooperation: Cooperative Education Importance: Acknowledges Need Interest in Health Problems: Asks Questions Perception: Coherent Willingness to Engage in Self- High Management Activities: Readiness to Engage in Self- High Management Activities: Electronic Signature(s) Signed: 09/03/2019 1:15:06 PM By: Gretta Cool, BSN, RN, CWS, Kim RN, BSN Entered By: Gretta Cool, BSN, RN, CWS, Kim on 09/01/2019 08:45:27 [name] ([id number]) -------------------------------------------------------------------------------- Fall Risk Assessment Details Patient Name: [name], [name] Date of Service: 09/01/2019 8:00 AM Medical Record Number: [id number] Patient  Account Number: [id number] Date of Birth/Sex: June 14, 1961 (57 y.o. M) Treating RN: Cornell Barman Primary Care Quinetta Shilling: [name] Other Clinician: Referring Chavis Tessler: [name] Treating Vernadine Coombs/Extender: [name], HOYT Weeks in Treatment: 0 Fall Risk Assessment Items Have you had 2 or more falls in the last 12 monthso 0 No Have you had any fall that resulted in injury in the last 12 monthso 0 No FALLS RISK SCREEN History of falling - immediate or within 3 months 0 No Secondary diagnosis (Do you have 2 or more medical diagnoseso) 0 No Ambulatory aid  None/bed rest/wheelchair/nurse 0 Yes Crutches/cane/walker 0 No Furniture 0 No Intravenous therapy Access/Saline/Heparin Lock 0 No Gait/Transferring Normal/ bed rest/ wheelchair 0 Yes Weak (short steps with or without shuffle, stooped but able to lift head while 0 No walking, may seek support from furniture) Impaired (short steps with shuffle, may have difficulty arising from chair, head 0 No down, impaired balance) Mental Status Oriented to own ability 0 Yes Electronic Signature(s) Signed: 09/03/2019 1:15:06 PM By: Gretta Cool, BSN, RN, CWS, Kim RN, BSN Entered By: Gretta Cool, BSN, RN, CWS, Kim on 09/01/2019 08:45:46 [name] ([id number]) -------------------------------------------------------------------------------- Foot Assessment Details Patient Name: [name], [name] Date of Service: 09/01/2019 8:00 AM Medical Record Number: [id number] Patient Account Number: [id number] Date of Birth/Sex: [birth year] (57 y.o. 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 Site Locations + = Sensation present, - = Sensation absent, C = Callus, U = Ulcer R =  Redness, W = Warmth, M = Maceration, PU = Pre-ulcerative lesion F = Fissure, S = Swelling, D = Dryness Assessment Right: Left: Other Deformity: Prior Foot Ulcer: Prior Amputation: Charcot Joint: Ambulatory Status: Gait: Electronic Signature(s) Signed: 09/03/2019 1:15:06 PM By: Gretta Cool, BSN, RN, CWS, Kim RN, BSN Entered By: Gretta Cool, BSN, RN, CWS, Kim on 09/01/2019 08:46:17 [name] ([id number]) -------------------------------------------------------------------------------- Nutrition Risk Screening Details Patient Name: [name], [name] Date of Service: 09/01/2019 8:00 AM Medical Record Number: [id number] Patient Account Number: [id number] Date of Birth/Sex: 1960/12/09 (57 y.o. M) Treating RN: Cornell Barman Primary Care Pansey Pinheiro: [name] Other Clinician: Referring Kattleya Kuhnert: [name] Treating Hiroyuki Ozanich/Extender: [name], HOYT Weeks in Treatment: 0 Height (in): 67 Weight (lbs): 212 Body Mass Index (BMI): 33.2 Nutrition Risk Screening Items Score Screening NUTRITION RISK SCREEN: I have an illness or condition that made me change the kind and/or amount of 0 No food I eat I eat fewer than two meals per day 0 No I eat few fruits and vegetables, or milk products 0 No I have three or more drinks of beer, liquor or wine almost every day 0 No I have tooth or mouth problems that make it hard for me to eat 0 No I don't always have enough money to buy the food I need 0 No I eat alone most of the time 0 No I take three or more different prescribed or over-the-counter drugs a day 1 Yes Without wanting to, I have lost or gained 10 pounds in the last six months 0 No I am not always physically able to shop, cook and/or feed myself 0 No Nutrition Protocols Good Risk Protocol 0 No interventions needed Moderate Risk Protocol High Risk Proctocol Risk Level: Good Risk Score: 1 Electronic Signature(s) Signed: 09/03/2019 1:15:06 PM By: Gretta Cool, BSN, RN, CWS, Kim RN, BSN Entered By:  Gretta Cool, BSN, RN, CWS, Kim on 09/01/2019 08:46:02

## 2019-09-08 ENCOUNTER — Encounter: Payer: Medicare Other | Admitting: Physician Assistant

## 2019-09-08 ENCOUNTER — Other Ambulatory Visit: Payer: Self-pay

## 2019-09-08 DIAGNOSIS — T8131XA Disruption of external operation (surgical) wound, not elsewhere classified, initial encounter: Secondary | ICD-10-CM | POA: Diagnosis not present

## 2019-09-09 NOTE — Progress Notes (Signed)
AKIRA, UTSLER ([id number]) Visit Report for 09/08/2019 Arrival Information Details Patient Name: [name], [name] Date of Service: 09/08/2019 4:00 PM Medical Record Number: [id number] Patient Account Number: [id number] Date of Birth/Sex: October 08, 1961 (58 y.o. M) Treating RN: Army Melia Primary Care Gita Dilger: [name] Other Clinician: Referring Nas Wafer: [name] Treating Sabre Romberger/Extender: [name], HOYT Weeks in Treatment: 1 Visit Information History Since Last Visit Added or deleted any medications: No Patient Arrived: Wheel Chair Any new allergies or adverse reactions: No Arrival Time: 16:02 Had a fall or experienced change in No Accompanied By: wife activities of daily living that may affect Transfer Assistance: None risk of falls: Patient Identification Verified: Yes Signs or symptoms of abuse/neglect since last visito No Hospitalized since last visit: No Has Dressing in Place as Prescribed: Yes Pain Present Now: No Electronic Signature(s) Signed: 09/09/2019 4:07:14 PM By: Army Melia Entered By: Army Melia on 09/08/2019 16:02:24 [name] ([id number]) -------------------------------------------------------------------------------- Encounter Discharge Information Details Patient Name: [name], [name] Date of Service: 09/08/2019 4:00 PM Medical Record Number: [id number] Patient Account Number: [id number] Date of Birth/Sex: 02/17/1961 (58 y.o. M) Treating RN: [name] Primary Care Deovion Batrez: [name] Other Clinician: Referring Huston Stonehocker: [name] Treating Ethaniel Garfield/Extender: [name], HOYT Weeks in Treatment: 1 Encounter Discharge Information Items Post Procedure Vitals Discharge Condition: Stable Temperature (F): 98.6 Ambulatory Status: Wheelchair Pulse (bpm): 72 Discharge Destination: Home Respiratory Rate (breaths/min): 16 Transportation: Private Auto Blood Pressure (mmHg): 119/40 Accompanied By: wife Schedule Follow-up  Appointment: Yes Clinical Summary of Care: Electronic Signature(s) Signed: 09/08/2019 5:06:49 PM By: [name] Entered By: [name] on 09/08/2019 16:28:43 [name] ([id number]) -------------------------------------------------------------------------------- Lower Extremity Assessment Details Patient Name: [name], [name] Date of Service: 09/08/2019 4:00 PM Medical Record Number: [id number] Patient Account Number: [id number] Date of Birth/Sex: 10/06/1961 (58 y.o. M) Treating RN: Army Melia Primary Care Macaria Bias: [name] Other Clinician: Referring Ivaan Liddy: [name] Treating Beulah Matusek/Extender: [name], HOYT Weeks in Treatment: 1 Electronic Signature(s) Signed: 09/09/2019 4:07:14 PM By: Army Melia Entered By: Army Melia on 09/08/2019 16:05:20 [name] ([id number]) -------------------------------------------------------------------------------- Multi Wound Chart Details Patient Name: [name], [name] Date of Service: 09/08/2019 4:00 PM Medical Record Number: [id number] Patient Account Number: [id number] Date of Birth/Sex: Mar 03, 1961 (58 y.o. M) Treating RN: [name] Primary Care Jester Klingberg: [name] Other Clinician: Referring Tiffony Kite: [name] Treating Deanette Tullius/Extender: STONE III, HOYT Weeks in Treatment: 1 Vital Signs Height(in): 67 Pulse(bpm): 72 Weight(lbs): 212 Blood Pressure(mmHg): 119/40 Body Mass Index(BMI): 33 Temperature(F): 98.6 Respiratory Rate 16 (breaths/min): Photos: [N/A:N/A] Wound Location: Left Forearm - Medial N/A N/A Wounding Event: Surgical Injury N/A N/A Primary Etiology: Dehisced Wound N/A N/A Comorbid History: Arrhythmia, Congestive Heart N/A N/A Failure, Hypertension, Peripheral Arterial Disease, Type II Diabetes, End Stage Renal Disease Date Acquired: 07/30/2019 N/A N/A Weeks of Treatment: 1 N/A N/A Wound Status: Open N/A N/A Measurements L x W x D 1x3x0.3 N/A N/A (cm) Area (cm) :  2.356 N/A N/A Volume (cm) : 0.707 N/A N/A % Reduction in Area: 42.30% N/A N/A % Reduction in Volume: 80.80% N/A N/A Classification: Full Thickness Without N/A N/A Exposed Support Structures Exudate Amount: Medium N/A N/A Exudate Type: Serous N/A N/A Exudate Color: amber N/A N/A Wound Margin: Flat and Intact N/A N/A Granulation Amount: None Present (0%) N/A N/A Necrotic Amount: Large (67-100%) N/A N/A Necrotic Tissue: Eschar, Adherent Slough N/A N/A Exposed Structures: Fat Layer (Subcutaneous N/A N/A Tissue) Exposed: Yes Epithelialization: None N/A N/A REFORD, NORGAARD ([id number]) Treatment Notes Electronic Signature(s) Signed: 09/08/2019  5:06:49 PM By: [name] Entered By: [name] on 09/08/2019 16:19:12 [name] ([id number]) -------------------------------------------------------------------------------- [place] Details Patient Name: Kristopher Haggard L. Date of Service: 09/08/2019 4:00 PM Medical Record Number: [id number] Patient Account Number: [id number] Date of Birth/Sex: September 23, 1961 (58 y.o. M) Treating RN: [name] Primary Care Tavin Vernet: [name] Other Clinician: Referring Lamarion Mcevers: [name] Treating Verble Styron/Extender: [name], HOYT Weeks in Treatment: 1 Active Inactive Abuse / Safety / Falls / Self Care Management Nursing Diagnoses: Potential for falls Goals: Patient will not experience any injury related to falls Date Initiated: 09/01/2019 Target Resolution Date: 11/21/2019 Goal Status: Active Interventions: Assess fall risk on admission and as needed Notes: Nutrition Nursing Diagnoses: Potential for alteratiion in Nutrition/Potential for imbalanced nutrition Goals: Patient/caregiver agrees to and verbalizes understanding of need to use nutritional supplements and/or vitamins as prescribed Date Initiated: 09/01/2019 Target Resolution Date: 11/21/2019 Goal Status: Active Interventions: Assess patient  nutrition upon admission and as needed per policy Notes: Orientation to the Wound Care Program Nursing Diagnoses: Knowledge deficit related to the wound healing center program Goals: Patient/caregiver will verbalize understanding of the [place] Program Date Initiated: 09/01/2019 Target Resolution Date: 11/21/2019 Goal Status: Active Interventions: Provide education on orientation to the wound center [place], Braidon L. ([id number]) Notes: Wound/Skin Impairment Nursing Diagnoses: Impaired tissue integrity Goals: Ulcer/skin breakdown will heal within 14 weeks Date Initiated: 09/01/2019 Target Resolution Date: 11/21/2019 Goal Status: Active Interventions: Assess patient/caregiver ability to obtain necessary supplies Assess patient/caregiver ability to perform ulcer/skin care regimen upon admission and as needed Assess ulceration(s) every visit Notes: Electronic Signature(s) Signed: 09/08/2019 5:06:49 PM By: [name] Entered By: [name] on 09/08/2019 16:17:54 [name], [name] ([id number]) -------------------------------------------------------------------------------- Pain Assessment Details Patient Name: [name], [name] Date of Service: 09/08/2019 4:00 PM Medical Record Number: [id number] Patient Account Number: [id number] Date of Birth/Sex: [birth year] (58 y.o. M) Treating RN: Army Melia Primary Care Taletha Twiford: [name] Other Clinician: Referring Sacheen Arrasmith: [name] Treating Sareen Randon/Extender: [name], HOYT Weeks in Treatment: 1 Active Problems Location of Pain Severity and Description of Pain Patient Has Paino No Site Locations Pain Management and Medication Current Pain Management: Electronic Signature(s) Signed: 09/09/2019 4:07:14 PM By: Army Melia Entered By: Army Melia on 09/08/2019 16:02:29 [name] ([id number]) -------------------------------------------------------------------------------- Patient/Caregiver Education  Details Patient Name: [name], Amaan L. Date of Service: 09/08/2019 4:00 PM Medical Record Number: [id number] Patient Account Number: [id number] Date of Birth/Gender: December 19, 1960 (58 y.o. M) Treating RN: [name] Primary Care Physician: [name] Other Clinician: Referring Physician: [name] Treating Physician/Extender: Sharalyn Ink in Treatment: 1 Education Assessment Education Provided To: Patient and Caregiver Education Topics Provided Wound/Skin Impairment: Handouts: Other: wound care as ordered Methods: Demonstration, Explain/Verbal Responses: State content correctly Electronic Signature(s) Signed: 09/08/2019 5:06:49 PM By: [name] Entered By: [name] on 09/08/2019 16:24:20 [name], [name] ([id number]) -------------------------------------------------------------------------------- Wound Assessment Details Patient Name: [name], [name] Date of Service: 09/08/2019 4:00 PM Medical Record Number: [id number] Patient Account Number: [id number] Date of Birth/Sex: 06/01/1961 (58 y.o. M) Treating RN: Army Melia Primary Care Kristjan Derner: [name] Other Clinician: Referring Khaiden Segreto: [name] Treating Avanthika Dehnert/Extender: STONE III, HOYT Weeks in Treatment: 1 Wound Status Wound Number: 3 Primary Dehisced Wound Etiology: Wound Location: Left Forearm - Medial Wound Open Wounding Event: Surgical Injury Status: Date Acquired: 07/30/2019 Comorbid Arrhythmia, Congestive Heart Failure, Weeks Of Treatment: 1 History: Hypertension, Peripheral Arterial Disease, Type Clustered Wound: No II Diabetes, End Stage Renal Disease Photos Wound Measurements Length: (cm) 1 Width: (cm)  3 Depth: (cm) 0.3 Area: (cm) 2.356 Volume: (cm) 0.707 % Reduction in Area: 42.3% % Reduction in Volume: 80.8% Epithelialization: None Tunneling: No Wound Description Full Thickness Without Exposed Support Foul Od Classification: Structures Slough/ Wound  Margin: Flat and Intact Exudate Medium Amount: Exudate Type: Serous Exudate Color: amber or After Cleansing: No Fibrino No Wound Bed Granulation Amount: None Present (0%) Exposed Structure Necrotic Amount: Large (67-100%) Fat Layer (Subcutaneous Tissue) Exposed: Yes Necrotic Quality: Eschar, Adherent Slough Treatment Notes Wound #3 (Left, Medial Forearm) Hargraves, [name] ([id number]) Notes santyl, xeroform, abd and netting Electronic Signature(s) Signed: 09/09/2019 4:07:14 PM By: Army Melia Entered By: Army Melia on 09/08/2019 16:04:39 [name], [name] ([id number]) -------------------------------------------------------------------------------- Vitals Details Patient Name: [name], [name] Date of Service: 09/08/2019 4:00 PM Medical Record Number: [id number] Patient Account Number: [id number] Date of Birth/Sex: [birth year] (58 y.o. M) Treating RN: Army Melia Primary Care Shaday Rayborn: [name] Other Clinician: Referring Pamelia Botto: [name] Treating Nayanna Seaborn/Extender: STONE III, HOYT Weeks in Treatment: 1 Vital Signs Time Taken: 16:02 Temperature (F): 98.6 Height (in): 67 Pulse (bpm): 72 Weight (lbs): 212 Respiratory Rate (breaths/min): 16 Body Mass Index (BMI): 33.2 Blood Pressure (mmHg): 119/40 Reference Range: 80 - 120 mg / dl Electronic Signature(s) Signed: 09/09/2019 4:07:14 PM By: Army Melia Entered By: Army Melia on 09/08/2019 16:03:08

## 2019-09-09 NOTE — Progress Notes (Signed)
ONUR, DEJA ([id number]) Visit Report for 09/08/2019 Chief Complaint Document Details Patient Name: [name] [name] L. Date of Service: 09/08/2019 4:00 PM Medical Record Number: [id number] Patient Account Number: [id number] Date of Birth/Sex: Jul 21, 1961 (57 y.o. M) Treating RN: [name] Primary Care Provider: [name] Other Clinician: Referring Provider: [name] Treating Provider/Extender: [name], HOYT Weeks in Treatment: 1 Information Obtained from: Patient Chief Complaint Left forearm surgical ulcer Electronic Signature(s) Signed: 09/08/2019 6:12:02 PM By: Worthy Keeler PA-C Entered By: Worthy Keeler on 09/08/2019 16:13:42 [name] [name] [name] ([id number]) -------------------------------------------------------------------------------- Debridement Details Patient Name: [name] [name], [name] Date of Service: 09/08/2019 4:00 PM Medical Record Number: [id number] Patient Account Number: [id number] Date of Birth/Sex: 1961/06/07 (57 y.o. M) Treating RN: [name] Primary Care Provider: [name] Other Clinician: Referring Provider: [name] Treating Provider/Extender: [name], HOYT Weeks in Treatment: 1 Debridement Performed for Wound #3 Left,Medial Forearm Assessment: Performed By: Physician STONE III, HOYT E., PA-C Debridement Type: Debridement Level of Consciousness (Pre- Awake and Alert procedure): Pre-procedure Verification/Time Yes - 16:20 Out Taken: Start Time: 16:20 Pain Control: Lidocaine 4% Topical Solution Total Area Debrided (L x W): 1 (cm) x 3 (cm) = 3 (cm) Tissue and other material Viable, Non-Viable, Slough, Subcutaneous, Slough debrided: Level: Skin/Subcutaneous Tissue Debridement Description: Excisional Instrument: Curette Bleeding: Minimum Hemostasis Achieved: Pressure End Time: 16:22 Procedural Pain: 0 Post Procedural Pain: 0 Response to Treatment: Procedure was tolerated well Level of Consciousness Awake and  Alert (Post-procedure): Post Debridement Measurements of Total Wound Length: (cm) 1 Width: (cm) 3 Depth: (cm) 0.5 Volume: (cm) 1.178 Character of Wound/Ulcer Post Debridement: Improved Post Procedure Diagnosis Same as Pre-procedure Electronic Signature(s) Signed: 09/08/2019 5:06:49 PM By: [name] Signed: 09/08/2019 6:12:02 PM By: Worthy Keeler PA-C Entered By: [name] on 09/08/2019 16:23:01 [name], [name] [name] ([id number]) -------------------------------------------------------------------------------- HPI Details Patient Name: [name] [name] [name] L. Date of Service: 09/08/2019 4:00 PM Medical Record Number: [id number] Patient Account Number: [id number] Date of Birth/Sex: [birth year] (57 y.o. 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 He has a history of hypertension, diabetes, congestive heart failure, and states that he is having a lot of discomfort at the wound site unfortunately. There does not appear to be any signs of active infection upon initial inspection today. No fevers, chills, nausea, or vomiting noted at this time.. Silvadene Cream has been utilized at some point on this wound as well. Patient did have home health coming out but unfortunately the patient and his wife state that they were not happy with the services being rendered and  therefore discontinued the service. There does not appear to be any signs of otherwise active infection at this time which is good news. The patient states he's definitely ready for this to heal and has been a symptomatic time since his surgery and he is becoming somewhat frustrated. Iodoflex also sounds to have previously been used on the wound that is when the  patient's wife states the wound seem to get worse not better. As best I can tell [name] has not been utilized. 02/03/19 on evaluation today patient appears to be doing much better compared to last week's evaluation in regard to his ulcer on the right amputation site. He's been tolerating the dressing changes without complication which is good news. The Santyl does seem to be of benefit in fact the wound appears to be doing significantly better compared to my prior evaluation. Overall very pleased in this regard. No fevers, chills, nausea, or vomiting noted at this time. 02/10/19 on evaluation today patient's wound actually appears to be showing signs of excellent improvement at this time which is great news. He has been tolerating the dressing changes without complication overall I feel like that there is no signs of active infection at this point which is also good news. I feel like that the [name] is very beneficial. Patient's wife likewise feels like things are doing quite well and is not having pain like he was in the past. 02/17/19 on evaluation today patient appears to be doing well in regard to the wound on the distal aspect of his right amputation site. He's been tolerating treatment with the Santyl at this point. I think he is nearing completion as far as treatment with cental is concerned I think were very close to getting this significantly better. Fortunately there's no evidence of infection currently. 03/03/19 on evaluation today patient actually appears to be doing quite well in regard to his his right imputation site ulceration. In  fact there's a lot of new skin growth which is excellent news. I'm very pleased in this regard. Fortunately there's no signs of active infection also good news. No fevers, chills, nausea, or vomiting noted at this time. 03/17/19 on evaluation today patient's wound actually appears to be showing signs of improvement which is good news. He does have a lot of issues however with concerns about how slowly this seems to be healing as far as he and his wife are concerned. I do believe some of the issues that were having currently are from the language barrier perspective but even that we have an interpreter that has been present for each evaluation I still think that there is some issues with their overall understanding of the treatment plan and how long wounds of this nature do take to heal. We began seeing him on April 14 and in just over a month and a half he has made improvement to where the wound is one third the size originally began. Nonetheless there still quite a bit of feeling left to do but again he has made great progress up to this point. Unfortunately he did have some issues with the silver collagen that was ordered he received something different from the [employer]. This was Colactive. Unfortunately this actually calls some burning he seemed to have some irritation from the dressing itself that caused irritation and redness around the edge of the wound that seems to be improving but nonetheless he and his wife both believe that the Santyl might've done better. They also really want to try to utilize something that will prevent me having to degree the wound as he typically has discomfort with the debridement obviously. 03/31/19 on evaluation today patient actually appears to be doing better in regard to his lower extremity ulcer. He's been tolerating the dressing changes without complication. Fortunately there's no signs of active infection at this time. He has a lot of new skin  growth in the  Santyl does seem to be working appropriately for him at this time. I'm very pleased in this regard. [name] [name] [name] ([id number]) 04/14/19 on evaluation today patient appears to be doing quite well in regard to his invitation site ulcer. This is a little smaller even than half the size of where it was when I saw him last. Fortunately there is no sign of active infection at this time which is good news. Overall he has been tolerating the dressing changes without complication still states that he has a lot of pain but again I really cannot see anything with regard to the wound that would be causing this and maybe more for neuropathy type pain. He states his weeks and up at night sometimes. 04/28/19-Patient presents at 2 weeks, with the right AKA wound, we have been using Santyl for this and there was mention of changing it to a different dressing this time around. 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 He's been tolerating the dressing changes with the Santyl unfortunately when we switched to the collagen dressing again last time he was seen in the office when I wasn't here his wound bed deteriorated as well in the following days. His wife came in to discuss this with me and subsequently we opted to go back to the [place] which seems to be doing excellent for him at this point. 05/26/2019 upon evaluation today patient actually appears to be doing excellent in regard to his wound on the right amputation site above the knee. In fact this appears to be almost completely healed which is great news. He has been utilizing Santyl. However he does have an issue on his left below-knee amputation stump where he has a wound as well he did not want to show me his wife wanted him to. In the end he decided he would and we did evaluate that today as well. It is not a very deep wound  but nonetheless is something that I think needs to be addressed sooner rather than later to prevent any additional issues at this point. 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 There may still be just a slight area where there some drainage but I really feel like most of the moisture is likely coming from the [place]. With that being said his left BKA ulcer is not doing quite as well there still a lot of Slough noted at this time and again there is some Epiboly as well this would require sharp debridement today. 06/23/2019 on evaluation today patient appears to be doing well with regard to his right ulcer on the distal stump. This actually is almost completely healed although there is a very tiny area still open. Subsequently he is also doing better with regard to the left distal stump location which he has been trying to be very careful about as far as not using his prosthesis anymore than he absolutely has to. I believe this shows and he has done a great job in this regard. 07/07/2019 on evaluation today patient actually appears to be doing well with regard to his amputation site on the right which is completely healed. On the left he is apparently been using his prosthesis way too much and I think that this has actually caused the area to have a more difficult  time healing. Overall I am not is pleased with this although the wound bed has a good base to it there was some slough noted this can require debridement the biggest issue that I see in my opinion is going to be getting him to not use his prosthesis in order to allow this to heal. Readmission: 09/01/2019 patient actually presents today for readmission into the clinic concerning issues that he has been having with a fistula surgery for his left forearm. He tells me that unfortunately this did not close appropriately and somewhat dehisced they attempted  to glue this back together but did not take. Nonetheless he tells me that since that time he has been having really no significant pain but he is concerned about the fact that the wound does not seem to be closing appropriately and obviously this is a fistula in his arm for dialysis access. 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 Overall he is somewhat frustrated at this point with how things appear in regard to the current surgical incision site. Nonetheless we did contact vascular we really have not heard as to whether or not we were taking over care of the wound at this point but nonetheless he is here in the office he was referred to [place] by vascular because home health was not good to be seeing the patient and nonetheless for the time being I am going to go ahead and initiate therapy if that is not what they would like for [place] to do than they can always take over care at a later point. 09/08/2019 on evaluation today patient actually appears to be showing some signs of improvement with regard to his ulcer on the left medial forearm. He has been tolerating the dressing changes without complication. The Santyl does seem to be benefiting him at this point based on what I am seeing. Fortunately there is no signs of active infection at this time. He does still have some fairly significant and thick slough noted over the surface of the wound unfortunately. Electronic Signature(s) Signed: 09/08/2019 6:12:02 PM By: Clovis Pu, Ziare Carlean Jews ([id number]) Entered By: Worthy Keeler on 09/08/2019 17:42:21 [name] ([id number]) -------------------------------------------------------------------------------- Physical Exam Details Patient Name: [name] [name] L. Date of Service: 09/08/2019 4:00 PM Medical Record Number: [id number] Patient Account Number: [id number] Date of  Birth/Sex: 10/10/1961 (57 y.o. M) Treating RN: [name] Primary Care Provider: [name] Other Clinician: Referring Provider: [name] Treating Provider/Extender: STONE III, HOYT Weeks in Treatment: 1 Constitutional Well-nourished and well-hydrated in no acute distress. Respiratory normal breathing without difficulty. Psychiatric this patient is able to make decisions and demonstrates good insight into disease process. Alert and Oriented x 3. pleasant and cooperative. Notes Patient's wound bed currently did have some slough noted I did attempt sharp debridement today to remove some of this although I really was not able to debride away as much as I would like to be honest. Nonetheless the patient did not have any pain I am just somewhat reluctant to perform debridement too deeply due to the fact that again this is a surgical site and I do not want to damage anything as far as the dialysis access is concerned. I think therefore were probably good after continue with the Santyl as far as progressing with further debridement going forward. Electronic Signature(s) Signed: 09/08/2019 6:12:02  PM By: Worthy Keeler PA-C Entered By: Worthy Keeler on 09/08/2019 17:42:52 [name] ([id number]) -------------------------------------------------------------------------------- Physician Orders Details Patient Name: [name] Haggard L. Date of Service: 09/08/2019 4:00 PM Medical Record Number: [id number] Patient Account Number: [id number] Date of Birth/Sex: 31-Aug-1961 (57 y.o. 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 o May shower with protection. - Please do not get your wound wet Primary Wound Dressing Wound #3 Left,Medial Forearm o Santyl Ointment - to wound bed o Xeroform - over santyl Secondary Dressing Wound #3 Left,Medial Forearm o ABD pad - secured with netting Dressing Change Frequency Wound #3 Left,Medial Forearm o Change dressing every day. Follow-up Appointments Wound #3 Left,Medial Forearm o Return Appointment in 2 weeks. Patient Medications Allergies: No Known Drug Allergies Notifications Medication Indication Start End Santyl 09/08/2019 NICHOLES, HUML ([id number]) Notifications Medication Indication Start End DOSE topical 250 unit/gram ointment - ointment topical Apply nickel thick to the wound bed and then cover with a dressing as directed in clinic. Electronic Signature(s) Signed: 09/08/2019 4:26:16 PM By: Worthy Keeler PA-C Entered By: Worthy Keeler on 09/08/2019 16:26:16 [name] ([id number]) -------------------------------------------------------------------------------- Problem List Details Patient Name: [name] [name], Blakeley L. Date of Service: 09/08/2019 4:00 PM Medical Record Number: [id number] Patient Account Number: [id number] Date of Birth/Sex: 20-Apr-1961 (57 y.o. 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 with fat layer 09/01/2019 No Yes exposed E11.622 Type 2 diabetes mellitus with other skin ulcer 09/01/2019 No Yes Z89.611 Acquired absence of right leg above knee 09/01/2019 No Yes Z89.512 Acquired absence of left leg below knee 09/01/2019 No Yes N18.6 End stage renal disease 09/01/2019 No Yes Z99.2 Dependence on renal dialysis 09/01/2019 No Yes I10 Essential (primary) hypertension 09/01/2019 No Yes I48.0 Paroxysmal atrial fibrillation 09/01/2019 No Yes Inactive Problems Resolved Problems [name] [name] [name] [name] ([id number]) Electronic Signature(s) Signed: 09/08/2019 6:12:02 PM By: Worthy Keeler PA-C Entered By: Worthy Keeler on 09/08/2019 16:13:38 Marcin, [name] [name] ([id number]) -------------------------------------------------------------------------------- Progress Note Details Patient Name: [name] [name] [name] L. Date of Service: 09/08/2019 4:00 PM Medical Record Number: [id number] Patient Account Number: [id number] Date of Birth/Sex: 27-Feb-1961 (57 y.o. 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  He has a history of hypertension, diabetes, congestive heart failure, and states that he is having a lot of discomfort at the wound site unfortunately. There does not appear to be any signs of active infection upon initial inspection today. No fevers, chills, nausea, or vomiting noted at this time.. Silvadene Cream has been utilized at some point on this wound as well. Patient did have home health coming out but unfortunately the patient and his wife state that they were not happy with the services being rendered and therefore discontinued the service. There does not appear to be any signs of otherwise active infection at this time which is good news. The patient states he's definitely ready for this to heal and has been a symptomatic time since his surgery and he is becoming somewhat frustrated. Iodoflex also sounds to have previously been used on the wound that is when the patient's wife states the wound seem to get worse not better. As best I can tell [name] has not been utilized. 02/03/19 on evaluation today patient appears to be doing much better compared to last week's evaluation in regard to his ulcer on the right amputation site. He's been tolerating the dressing changes without complication which is good news. The Santyl does seem to be of benefit in fact the wound appears to be doing significantly better compared to my prior evaluation. Overall very pleased in this regard. No fevers, chills, nausea, or vomiting noted at this time. 02/10/19 on evaluation today patient's wound actually appears to be showing signs of excellent improvement at this time which is great news. He has been tolerating the dressing changes without complication overall I feel like that there is no signs of active infection at this point which is also good news. I feel like that the [name] is very beneficial. Patient's wife likewise feels like things are doing quite well and is not having pain like he was in the  past. 02/17/19 on evaluation today patient appears to be doing well in regard to the wound on the distal aspect of his right amputation site. He's been tolerating treatment with the Santyl at this point. I think he is nearing completion as far as treatment with cental is concerned I think were very close to getting this significantly better. Fortunately there's no evidence of infection currently. 03/03/19 on evaluation today patient actually appears to be doing quite well in regard to his his right imputation site ulceration. In fact there's a lot of new skin growth which is excellent news. I'm very pleased in this regard. Fortunately there's no signs of active infection also good news. No fevers, chills, nausea, or vomiting noted at this time. 03/17/19 on evaluation today patient's wound actually appears to be showing signs of improvement which is good news. He does have a lot of issues however with concerns about how slowly this seems to be healing as far as he and his wife are concerned. I do believe some of the issues that were having currently are from the language barrier perspective but even that we have an interpreter that has been present for each evaluation I still think that there is some issues with their overall understanding of the treatment plan and how long wounds of this nature do  take to heal. We began seeing him on April 14 and in just over a month and a half he has made improvement to where the wound is one third the size originally began. Nonetheless there still quite a bit of feeling left to do but again he has made great progress up to this point. Unfortunately he did have some issues with the silver collagen that was ordered he received something different from the [facility]. This was Colactive. Unfortunately this actually calls some burning he seemed to have some irritation from the dressing itself that caused irritation and redness around the edge of the wound that seems to be  improving but nonetheless he and his wife both [name] [name] [name] L. ([id number]) believe that the Santyl might've done better. They also really want to try to utilize something that will prevent me having to degree the wound as he typically has discomfort with the debridement obviously. 03/31/19 on evaluation today patient actually appears to be doing better in regard to his lower extremity ulcer. He's been tolerating the dressing changes without complication. Fortunately there's no signs of active infection at this time. He has a lot of new skin growth in the Santyl does seem to be working appropriately for him at this time. I'm very pleased in this regard. 04/14/19 on evaluation today patient appears to be doing quite well in regard to his invitation site ulcer. This is a little smaller even than half the size of where it was when I saw him last. Fortunately there is no sign of active infection at this time which is good news. Overall he has been tolerating the dressing changes without complication still states that he has a lot of pain but again I really cannot see anything with regard to the wound that would be causing this and maybe more for neuropathy type pain. He states his weeks and up at night sometimes. 04/28/19-Patient presents at 2 weeks, with the right AKA wound, we have been using Santyl for this and there was mention of changing it to a different dressing this time around. 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 He's been tolerating the dressing changes with the Santyl unfortunately when we switched to the collagen dressing again last time he was seen in the office when I wasn't here his wound bed deteriorated as well in the following days. His wife came in to discuss this with me and subsequently we opted to go back to the [place] which seems to be doing  excellent for him at this point. 05/26/2019 upon evaluation today patient actually appears to be doing excellent in regard to his wound on the right amputation site above the knee. In fact this appears to be almost completely healed which is great news. He has been utilizing Santyl. However he does have an issue on his left below-knee amputation stump where he has a wound as well he did not want to show me his wife wanted him to. In the end he decided he would and we did evaluate that today as well. It is not a very deep wound but nonetheless is something that I think needs to be addressed sooner rather than later to prevent any additional issues at this point. 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 There may still be just a slight area  where there some drainage but I really feel like most of the moisture is likely coming from the [place]. With that being said his left BKA ulcer is not doing quite as well there still a lot of Slough noted at this time and again there is some Epiboly as well this would require sharp debridement today. 06/23/2019 on evaluation today patient appears to be doing well with regard to his right ulcer on the distal stump. This actually is almost completely healed although there is a very tiny area still open. Subsequently he is also doing better with regard to the left distal stump location which he has been trying to be very careful about as far as not using his prosthesis anymore than he absolutely has to. I believe this shows and he has done a great job in this regard. 07/07/2019 on evaluation today patient actually appears to be doing well with regard to his amputation site on the right which is completely healed. On the left he is apparently been using his prosthesis way too much and I think that this has actually caused the area to have a more difficult time healing. Overall I am  not is pleased with this although the wound bed has a good base to it there was some slough noted this can require debridement the biggest issue that I see in my opinion is going to be getting him to not use his prosthesis in order to allow this to heal. Readmission: 09/01/2019 patient actually presents today for readmission into the clinic concerning issues that he has been having with a fistula surgery for his left forearm. He tells me that unfortunately this did not close appropriately and somewhat dehisced they attempted to glue this back together but did not take. Nonetheless he tells me that since that time he has been having really no significant pain but he is concerned about the fact that the wound does not seem to be closing appropriately and obviously this is a fistula in his arm for dialysis access. 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 Overall he is somewhat frustrated at this point with how things appear in regard to the current surgical incision site. Nonetheless we did contact vascular we really have not heard as to whether or not we were taking over care of the wound at this point but nonetheless he is here in the office he was referred to [place] by vascular because home health was not good to be seeing the patient and nonetheless for the time being I am going to go ahead and initiate therapy if that is not what they would like for [place] to do than they can always take over care at a later point. 09/08/2019 on evaluation today patient actually appears to be showing some signs of improvement with regard to his ulcer on the left medial forearm. He has been tolerating the dressing changes without complication. The Santyl does seem to be [name] [name] [name] L. ([id number]) benefiting him at this point based on what I am seeing. Fortunately there is no signs of active infection at  this time. He does still have some fairly significant and thick slough noted over the surface of the wound unfortunately. Patient History Information obtained from Patient. Family History Cancer - Father, Diabetes - Siblings, No family history of Hereditary Spherocytosis. Social History Former smoker - ended on  10/16/1994, Marital Status - Married, Alcohol Use - Never, Drug Use - No History, Caffeine Use - Rarely. Medical History Cardiovascular Patient has history of Arrhythmia - A-fib, Congestive Heart Failure, Hypertension, Peripheral Arterial Disease - Bilateral LE amputations Endocrine Patient has history of Type II Diabetes Genitourinary Patient has history of End Stage Renal Disease - Dialysis M,W,F Hospitalization/Surgery History - Right AKA. - Left BKA. 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 Respiratory Denies complaints or symptoms of Chronic or frequent coughs, Shortness of Breath. Cardiovascular Denies complaints or symptoms of Chest pain, LE edema. Psychiatric Denies complaints or symptoms of Anxiety, Claustrophobia. Objective Constitutional Well-nourished and well-hydrated in no acute distress. Vitals Time Taken: 4:02 PM, Height: 67 in, Weight: 212 lbs, BMI: 33.2, Temperature: 98.6 F, Pulse: 72 bpm, Respiratory Rate: 16 breaths/min, Blood Pressure: 119/40 mmHg. Respiratory normal breathing without difficulty. [name] [name] [name] [name] ([id number]) Psychiatric this patient is able to make decisions and demonstrates good insight into disease process. Alert and Oriented x 3. pleasant and cooperative. General Notes: Patient's wound bed currently did have some slough noted I did attempt sharp debridement today to remove some  of this although I really was not able to debride away as much as I would like to be honest. Nonetheless the patient did not have any pain I am just somewhat reluctant to perform debridement too deeply due to the fact that again this is a surgical site and I do not want to damage anything as far as the dialysis access is concerned. I think therefore were probably good after continue with the Santyl as far as progressing with further debridement going forward. Integumentary (Hair, Skin) Wound #3 status is Open. Original cause of wound was Surgical Injury. The wound is located on the Left,Medial Forearm. The wound measures 1cm length x 3cm width x 0.3cm depth; 2.356cm^2 area and 0.707cm^3 volume. There is Fat Layer (Subcutaneous Tissue) Exposed exposed. There is no tunneling noted. There is a medium amount of serous drainage noted. The wound margin is flat and intact. There is no granulation within the wound bed. There is a large (67-100%) amount of necrotic tissue within the wound bed including Eschar and Adherent Slough. 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 There was a Excisional Skin/Subcutaneous Tissue Debridement with a total area of 3 sq cm performed by STONE III, HOYT E., PA-C. With the following instrument(s): Curette to remove Viable and Non-Viable tissue/material. Material removed includes Subcutaneous Tissue and Slough and after achieving pain control using Lidocaine  4% Topical Solution. No specimens were taken. A time out was conducted at 16:20, prior to the start of the procedure. A Minimum amount of bleeding was controlled with Pressure. The procedure was tolerated well with a pain level of 0 throughout and a pain level of 0 following the procedure. Post Debridement Measurements: 1cm length x 3cm width x 0.5cm depth; 1.178cm^3 volume.  Character of Wound/Ulcer Post Debridement is improved. Post procedure Diagnosis Wound #3: Same as Pre-Procedure [name] [name] L. ([id number]) Plan Wound Cleansing: Wound #3 Left,Medial Forearm: Clean wound with Normal Saline. May shower with protection. - Please do not get your wound wet Primary Wound Dressing: Wound #3 Left,Medial Forearm: Santyl Ointment - to wound bed Xeroform - over santyl Secondary Dressing: Wound #3 Left,Medial Forearm: ABD pad - secured with netting Dressing Change Frequency: Wound #3 Left,Medial Forearm: Change dressing every day. Follow-up Appointments: Wound #3 Left,Medial Forearm: Return Appointment in 2 weeks. The following medication(s) was prescribed: Santyl topical 250 unit/gram ointment ointment topical Apply nickel thick to the wound bed and then cover with a dressing as directed in clinic. starting 09/08/2019 1. My suggestion at this time based on what I am seeing is good to be that we go ahead and continue with the Santyl since I feel like that is helping to clean up the wound and again is a much safer way than aggressive sharp debridement which I am somewhat reluctant to do at this point. The patient and his wife are in agreement with that plan. 2. I would recommend as well that the patient continue with his follow-up with vascular next week again I explained that this is something I definitely want him to keep and since he is seeing them next week I will likely see him in 2 weeks time to see where things stand at that point. 3. I did send in a refill for the Santyl for him  today though I explained the need to make sure that the not overusing this as again insurance will likely pay for so much at a time. We will see patient back for reevaluation in 2 weeks here in the clinic. If anything worsens or changes patient will contact our office for additional recommendations. Electronic Signature(s) Signed: 09/08/2019 6:12:02 PM By: Worthy Keeler PA-C Entered By: Worthy Keeler on 09/08/2019 17:44:02 [name] ([id number]) -------------------------------------------------------------------------------- ROS/PFSH Details Patient Name: [name] [name] [name] L. Date of Service: 09/08/2019 4:00 PM Medical Record Number: [id number] Patient Account Number: [id number] Date of Birth/Sex: 05-21-1961 (57 y.o. 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 Respiratory Complaints and Symptoms: Negative for: Chronic or frequent coughs; Shortness of Breath Cardiovascular Complaints and Symptoms: Negative for: Chest pain; LE edema Medical History: Positive for: Arrhythmia - A-fib; Congestive Heart Failure; Hypertension; Peripheral Arterial Disease - Bilateral LE amputations Psychiatric Complaints and Symptoms: Negative for: Anxiety; Claustrophobia Endocrine Medical History: Positive for: Type II Diabetes Time with diabetes: 14 years Treated with: Oral agents Blood sugar tested every day: No Blood sugar testing results: Breakfast: 128; Lunch: 145 Genitourinary Medical History: Positive for: End Stage Renal Disease - Dialysis  M,W,F Immunizations KEATAN, VANDEVOORT. ([id number]) Pneumococcal Vaccine: Received Pneumococcal Vaccination: Yes Implantable Devices None Hospitalization / Surgery History Type of Hospitalization/Surgery Right AKA Left BKA Family and Social History Cancer: Yes - Father; Diabetes: Yes - Siblings; Hereditary Spherocytosis: No; Former smoker - ended on 10/16/1994; Marital Status - Married; Alcohol Use: Never; Drug Use: No History; Caffeine Use: Rarely Physician Affirmation I have reviewed and agree with the above information. Electronic Signature(s) Signed: 09/08/2019 6:12:02 PM By:  Worthy Keeler PA-C Signed: 09/09/2019 4:08:09 PM By: [name] Entered By: Worthy Keeler on 09/08/2019 17:42:38 Vest, [name] [name] ([id number]) -------------------------------------------------------------------------------- [place] Details Patient Name: [name] [name], Brett L. Date of Service: 09/08/2019 Medical Record Number: [id number] Patient Account Number: [id number] Date of Birth/Sex: [birth year] (58 y.o. M) Treating RN: [name] Primary Care Provider: [name] Other Clinician: Referring Provider: [name] Treating Provider/Extender: [name], HOYT Weeks in Treatment: 1 Diagnosis Coding ICD-10 Codes Code Description T81.31XA Disruption of external operation (surgical) wound, not elsewhere classified, initial encounter L98.492 Non-pressure chronic ulcer of skin of other sites with fat layer exposed E11.622 Type 2 diabetes mellitus with other skin ulcer Z89.611 Acquired absence of right leg above knee Z89.512 Acquired absence of left leg below knee N18.6 End stage renal disease Z99.2 Dependence on renal dialysis I10 Essential (primary) hypertension I48.0 Paroxysmal atrial fibrillation Facility Procedures CPT4: Description Modifier Quantity Code [id number] - DEB SUBQ TISSUE 20 SQ CM/< 1 ICD-10 Diagnosis Description T81.31XA Disruption of external operation (surgical) wound, not  elsewhere classified, initial encounter L98.492 Non-pressure chronic ulcer  of skin of other sites with fat layer exposed Physician Procedures CPT4: Description Modifier Quantity Code [id number] - WC PHYS SUBQ TISS 20 SQ CM 1 ICD-10 Diagnosis Description T81.31XA Disruption of external operation (surgical) wound, not elsewhere classified, initial encounter L98.492 Non-pressure chronic ulcer  of skin of other sites with fat layer exposed Electronic Signature(s) Signed: 09/08/2019 6:12:02 PM By: Worthy Keeler PA-C Entered By: Worthy Keeler on 09/08/2019 17:44:18

## 2019-09-15 ENCOUNTER — Ambulatory Visit: Payer: Medicare Other | Admitting: Physician Assistant

## 2019-09-22 ENCOUNTER — Encounter: Payer: Medicare Other | Attending: Physician Assistant | Admitting: Physician Assistant

## 2019-09-22 ENCOUNTER — Other Ambulatory Visit: Payer: Self-pay

## 2019-09-22 DIAGNOSIS — L98492 Non-pressure chronic ulcer of skin of other sites with fat layer exposed: Secondary | ICD-10-CM | POA: Insufficient documentation

## 2019-09-22 DIAGNOSIS — Z87891 Personal history of nicotine dependence: Secondary | ICD-10-CM | POA: Diagnosis not present

## 2019-09-22 DIAGNOSIS — N186 End stage renal disease: Secondary | ICD-10-CM | POA: Insufficient documentation

## 2019-09-22 DIAGNOSIS — E1122 Type 2 diabetes mellitus with diabetic chronic kidney disease: Secondary | ICD-10-CM | POA: Insufficient documentation

## 2019-09-22 DIAGNOSIS — Z992 Dependence on renal dialysis: Secondary | ICD-10-CM | POA: Insufficient documentation

## 2019-09-22 DIAGNOSIS — I132 Hypertensive heart and chronic kidney disease with heart failure and with stage 5 chronic kidney disease, or end stage renal disease: Secondary | ICD-10-CM | POA: Insufficient documentation

## 2019-09-22 DIAGNOSIS — I480 Paroxysmal atrial fibrillation: Secondary | ICD-10-CM | POA: Diagnosis not present

## 2019-09-22 DIAGNOSIS — Z89512 Acquired absence of left leg below knee: Secondary | ICD-10-CM | POA: Insufficient documentation

## 2019-09-22 DIAGNOSIS — I509 Heart failure, unspecified: Secondary | ICD-10-CM | POA: Diagnosis not present

## 2019-09-22 DIAGNOSIS — E1151 Type 2 diabetes mellitus with diabetic peripheral angiopathy without gangrene: Secondary | ICD-10-CM | POA: Insufficient documentation

## 2019-09-22 DIAGNOSIS — E11622 Type 2 diabetes mellitus with other skin ulcer: Secondary | ICD-10-CM | POA: Insufficient documentation

## 2019-09-22 DIAGNOSIS — Z89611 Acquired absence of right leg above knee: Secondary | ICD-10-CM | POA: Diagnosis not present

## 2019-09-22 NOTE — Progress Notes (Signed)
TAHAJ, STAKER ([id number]) Visit Report for 09/22/2019 Allergy List Details Patient Name: [name], [name] Date of Service: 09/22/2019 8:30 AM Medical Record Number: [id number] Patient Account Number: [id number] Date of Birth/Sex: Apr 17, 1961 (58 y.o. M) Treating RN: Cornell Barman Primary Care Leshonda Galambos: [name] Other Clinician: Referring Nava Song: [name] Treating Lavoris Canizales/Extender: [name], HOYT Weeks in Treatment: 3 Allergies Active Allergies No Known Drug Allergies Allergy Notes Electronic Signature(s) Signed: 09/22/2019 9:58:45 AM By: Gretta Cool, BSN, RN, CWS, Kim RN, BSN Entered By: Gretta Cool, BSN, RN, CWS, Kim on 09/22/2019 09:58:44 [name] ([id number]) -------------------------------------------------------------------------------- Arrival Information Details Patient Name: [name], [name] Date of Service: 09/22/2019 8:30 AM Medical Record Number: [id number] Patient Account Number: [id number] Date of Birth/Sex: 04-20-1961 (58 y.o. M) Treating RN: [name] Primary Care Roselyn Doby: [name] Other Clinician: Referring Barnes Florek: [name] Treating Fedrick Cefalu/Extender: [name], HOYT Weeks in Treatment: 3 Visit Information Patient Arrived: Wheel Chair Arrival Time: 08:30 Accompanied By: wife, interpreter Transfer Assistance: None Patient Identification Verified: Yes Secondary Verification Process Completed: Yes History Since Last Visit Added or deleted any medications: No Any new allergies or adverse reactions: No Had a fall or experienced change in activities of daily living that may affect risk of falls: No Signs or symptoms of abuse/neglect since last visito No Hospitalized since last visit: No Implantable device outside of the clinic excluding cellular tissue based products placed in the center since last visit: No Has Dressing in Place as Prescribed: Yes Pain Present Now: No Electronic Signature(s) Signed: 09/22/2019 11:59:11 AM By: Lorine Bears RCP, RRT, CHT Entered By: Lorine Bears on 09/22/2019 08:31:09 [name] ([id number]) -------------------------------------------------------------------------------- Encounter Discharge Information Details Patient Name: [name], Galen L. Date of Service: 09/22/2019 8:30 AM Medical Record Number: [id number] Patient Account Number: [id number] Date of Birth/Sex: 07-Apr-1961 (58 y.o. M) Treating RN: [name] Primary Care Carlyann Placide: [name] Other Clinician: Referring Khyra Viscuso: [name] Treating Olawale Marney/Extender: [name], HOYT Weeks in Treatment: 3 Encounter Discharge Information Items Post Procedure Vitals Discharge Condition: Stable Temperature (F): 98.6 Ambulatory Status: Wheelchair Pulse (bpm): 69 Discharge Destination: Home Respiratory Rate (breaths/min): 16 Transportation: Private Auto Blood Pressure (mmHg): 105/36 Accompanied By: spouse Schedule Follow-up Appointment: Yes Clinical Summary of Care: Electronic Signature(s) Signed: 09/22/2019 4:13:22 PM By: [name] Entered By: [name] on 09/22/2019 08:48:59 [name], [name] ([id number]) -------------------------------------------------------------------------------- Lower Extremity Assessment Details Patient Name: [name], Kamuela L. Date of Service: 09/22/2019 8:30 AM Medical Record Number: [id number] Patient Account Number: [id number] Date of Birth/Sex: April 10, 1961 (58 y.o. M) Treating RN: Army Melia Primary Care Britnay Magnussen: [name] Other Clinician: Referring Ilse Billman: [name] Treating Thia Olesen/Extender: [name], HOYT Weeks in Treatment: 3 Electronic Signature(s) Signed: 09/22/2019 11:19:43 AM By: Army Melia Entered By: Army Melia on 09/22/2019 08:39:20 [name] ([id number]) -------------------------------------------------------------------------------- Multi Wound Chart Details Patient Name: [name], [name] Date of Service: 09/22/2019  8:30 AM Medical Record Number: [id number] Patient Account Number: [id number] Date of Birth/Sex: 07/12/1961 (58 y.o. M) Treating RN: [name] Primary Care Malaijah Houchen: [name] Other Clinician: Referring Kinnedy Mongiello: [name] Treating Jeanita Carneiro/Extender: STONE III, HOYT Weeks in Treatment: 3 Vital Signs Height(in): 96 Pulse(bpm): 64 Weight(lbs): 212 Blood Pressure(mmHg): 105/36 Body Mass Index(BMI): 33 Temperature(F): 98.6 Respiratory Rate 16 (breaths/min): Photos: [N/A:N/A] Wound Location: Left Forearm - Medial N/A N/A Wounding Event: Surgical Injury N/A N/A Primary Etiology: Dehisced Wound N/A N/A Comorbid History: Arrhythmia, Congestive Heart N/A N/A Failure, Hypertension, Peripheral Arterial Disease, Type II Diabetes, End Stage Renal Disease Date Acquired: 07/30/2019 N/A N/A [place]  of Treatment: 3 N/A N/A Wound Status: Open N/A N/A Measurements L x W x D 0.7x2.3x0.2 N/A N/A (cm) Area (cm) : 1.264 N/A N/A Volume (cm) : 0.253 N/A N/A % Reduction in Area: 69.00% N/A N/A % Reduction in Volume: 93.10% N/A N/A Classification: Full Thickness Without N/A N/A Exposed Support Structures Exudate Amount: Medium N/A N/A Exudate Type: Serous N/A N/A Exudate Color: amber N/A N/A Wound Margin: Flat and Intact N/A N/A Granulation Amount: None Present (0%) N/A N/A Necrotic Amount: Large (67-100%) N/A N/A Necrotic Tissue: Eschar, [name] N/A N/A Exposed Structures: Fat Layer (Subcutaneous N/A N/A Tissue) Exposed: Yes Epithelialization: None N/A N/A [id number], SUNN ([id number]) Treatment Notes Electronic Signature(s) Signed: 09/22/2019 4:13:22 PM By: [name] Entered By: [name] on 09/22/2019 08:42:23 [name] ([id number]) -------------------------------------------------------------------------------- [name] Details Patient Name: [name], [name] Date of Service: 09/22/2019 8:30 AM Medical Record Number:  [id number] Patient Account Number: [id number] Date of Birth/Sex: May 01, 1961 (58 y.o. M) Treating RN: [name] Primary Care Allante Beane: [name] Other Clinician: Referring Luis Nickles: [name] Treating Umi Mainor/Extender: [name], HOYT Weeks in Treatment: 3 Active Inactive Abuse / Safety / Falls / Self Care Management Nursing Diagnoses: Potential for falls Goals: Patient will not experience any injury related to falls Date Initiated: 09/01/2019 Target Resolution Date: 11/21/2019 Goal Status: Active Interventions: Assess fall risk on admission and as needed Notes: Nutrition Nursing Diagnoses: Potential for alteratiion in Nutrition/Potential for imbalanced nutrition Goals: Patient/caregiver agrees to and verbalizes understanding of need to use nutritional supplements and/or vitamins as prescribed Date Initiated: 09/01/2019 Target Resolution Date: 11/21/2019 Goal Status: Active Interventions: Assess patient nutrition upon admission and as needed per policy Notes: Orientation to the Wound Care Program Nursing Diagnoses: Knowledge deficit related to the wound healing center program Goals: Patient/caregiver will verbalize understanding of the [place] Program Date Initiated: 09/01/2019 Target Resolution Date: 11/21/2019 Goal Status: Active Interventions: Provide education on orientation to the wound center [name], [name] ([id number]) Notes: Wound/Skin Impairment Nursing Diagnoses: Impaired tissue integrity Goals: Ulcer/skin breakdown will heal within 14 weeks Date Initiated: 09/01/2019 Target Resolution Date: 11/21/2019 Goal Status: Active Interventions: Assess patient/caregiver ability to obtain necessary supplies Assess patient/caregiver ability to perform ulcer/skin care regimen upon admission and as needed Assess ulceration(s) every visit Notes: Electronic Signature(s) Signed: 09/22/2019 4:13:22 PM By: [name] Entered By: [name] on  09/22/2019 08:42:15 [name], [name] ([id number]) -------------------------------------------------------------------------------- Pain Assessment Details Patient Name: [name], Pal L. Date of Service: 09/22/2019 8:30 AM Medical Record Number: [id number] Patient Account Number: [id number] Date of Birth/Sex: 04/13/1961 (58 y.o. M) Treating RN: [name] Primary Care Sally Menard: [name] Other Clinician: Referring Dashonda Bonneau: [name] Treating Rusti Arizmendi/Extender: [name], HOYT Weeks in Treatment: 3 Active Problems Location of Pain Severity and Description of Pain Patient Has Paino No Site Locations Pain Management and Medication Current Pain Management: Electronic Signature(s) Signed: 09/22/2019 11:59:11 AM By: Paulla Fore, RRT, CHT Signed: 09/22/2019 4:13:22 PM By: [name] Entered By: Lorine Bears on 09/22/2019 08:31:18 [name] ([id number]) -------------------------------------------------------------------------------- Patient/Caregiver Education Details Patient Name: [name], Vashaun L. Date of Service: 09/22/2019 8:30 AM Medical Record Number: [id number] Patient Account Number: [id number] Date of Birth/Gender: 1961-01-28 (58 y.o. M) Treating RN: [name] Primary Care Physician: [name] Other Clinician: Referring Physician: [name] Treating Physician/Extender: Sharalyn Ink in Treatment: 3 Education Assessment Education Provided To: Patient and Caregiver Education Topics Provided Wound/Skin Impairment: Handouts: Other: wound care as ordered Methods: Demonstration, Explain/Verbal Responses: State  content correctly Electronic Signature(s) Signed: 09/22/2019 4:13:22 PM By: [name] Entered By: [name] on 09/22/2019 08:48:13 [name] ([id number]) -------------------------------------------------------------------------------- Wound Assessment Details Patient Name: [name],  [name] Date of Service: 09/22/2019 8:30 AM Medical Record Number: [id number] Patient Account Number: [id number] Date of Birth/Sex: May 09, 1961 (58 y.o. M) Treating RN: Army Melia Primary Care Inigo Lantigua: [name] Other Clinician: Referring Corwyn Vora: [name] Treating Elba Schaber/Extender: STONE III, HOYT Weeks in Treatment: 3 Wound Status Wound Number: 3 Primary Dehisced Wound Etiology: Wound Location: Left Forearm - Medial Wound Open Wounding Event: Surgical Injury Status: Date Acquired: 07/30/2019 Comorbid Arrhythmia, Congestive Heart Failure, Weeks Of Treatment: 3 History: Hypertension, Peripheral Arterial Disease, Type Clustered Wound: No II Diabetes, End Stage Renal Disease Photos Wound Measurements Length: (cm) 0.7 Width: (cm) 2.3 Depth: (cm) 0.2 Area: (cm) 1.264 Volume: (cm) 0.253 % Reduction in Area: 69% % Reduction in Volume: 93.1% Epithelialization: None Tunneling: No Undermining: No Wound Description Full Thickness Without Exposed Support Foul Od Classification: Structures [name]/ Wound Margin: Flat and Intact Exudate Medium Amount: Exudate Type: Serous Exudate Color: amber or After Cleansing: No Fibrino No Wound Bed Granulation Amount: None Present (0%) Exposed Structure Necrotic Amount: Large (67-100%) Fat Layer (Subcutaneous Tissue) Exposed: Yes Necrotic Quality: [name], [name] Treatment Notes Wound #3 (Left, Medial Forearm) [name], [name] ([id number]) Notes santyl, xeroform, abd and netting Electronic Signature(s) Signed: 09/22/2019 11:19:43 AM By: Army Melia Entered By: Army Melia on 09/22/2019 08:37:57 [name], [name] ([id number]) -------------------------------------------------------------------------------- Vitals Details Patient Name: [name], Kamar L. Date of Service: 09/22/2019 8:30 AM Medical Record Number: [id number] Patient Account Number: [id number] Date of Birth/Sex: Mar 15, 1961 (58 y.o. M) Treating RN: [name] Primary Care Quinnley Colasurdo: [name] Other Clinician: Referring Darletta Noblett: [name] Treating Benay Pomeroy/Extender: STONE III, HOYT Weeks in Treatment: 3 Vital Signs Time Taken: 08:32 Temperature (F): 98.6 Height (in): 67 Pulse (bpm): 69 Weight (lbs): 212 Respiratory Rate (breaths/min): 16 Body Mass Index (BMI): 33.2 Blood Pressure (mmHg): 105/36 Reference Range: 80 - 120 mg / dl Electronic Signature(s) Signed: 09/22/2019 11:59:11 AM By: Lorine Bears RCP, RRT, CHT Entered By: Lorine Bears on 09/22/2019 08:34:35

## 2019-09-22 NOTE — Progress Notes (Signed)
DAVY, DEBERG ([id number]) Visit Report for 09/22/2019 Chief Complaint Document Details Patient Name: [name] L. Date of Service: 09/22/2019 8:30 AM Medical Record Number: [id number] Patient Account Number: [id number] Date of Birth/Sex: 1961/03/30 (58 y.o. M) Treating RN: [name] Primary Care Provider: [name] Other Clinician: Referring Provider: [name] Treating Provider/Extender: [name], Heer Justiss Weeks in Treatment: 3 Information Obtained from: Patient Chief Complaint Left forearm surgical ulcer Electronic Signature(s) Signed: 09/22/2019 9:52:07 AM By: Worthy Keeler PA-C Entered By: Worthy Keeler on 09/22/2019 08:40:43 [name] ([id number]) -------------------------------------------------------------------------------- Debridement Details Patient Name: [name], [name] Date of Service: 09/22/2019 8:30 AM Medical Record Number: [id number] Patient Account Number: [id number] Date of Birth/Sex: 1961-02-28 (58 y.o. M) Treating RN: [name] Primary Care Provider: [name] Other Clinician: Referring Provider: [name] Treating Provider/Extender: [name], Prabhav Faulkenberry Weeks in Treatment: 3 Debridement Performed for Wound #3 Left,Medial Forearm Assessment: Performed By: Clinician [name], RN Debridement Type: Chemical/Enzymatic/Mechanical Agent Used: Santyl Level of Consciousness (Pre- Awake and Alert procedure): Pre-procedure Verification/Time Yes - 08:47 Out Taken: Start Time: 08:47 Pain Control: Lidocaine 4% Topical Solution Instrument: Other : tongue blade Bleeding: None End Time: 08:48 Procedural Pain: 0 Post Procedural Pain: 0 Response to Treatment: Procedure was tolerated well Level of Consciousness Awake and Alert (Post-procedure): Post Debridement Measurements of Total Wound Length: (cm) 0.7 Width: (cm) 2.3 Depth: (cm) 0.2 Volume: (cm) 0.253 Character of Wound/Ulcer Post Debridement: Improved Post Procedure  Diagnosis Same as Pre-procedure Electronic Signature(s) Signed: 09/22/2019 9:52:07 AM By: Worthy Keeler PA-C Signed: 09/22/2019 4:13:22 PM By: [name] Entered By: [name] on 09/22/2019 08:47:49 [name] ([id number]) -------------------------------------------------------------------------------- HPI Details Patient Name: [name], [name] Date of Service: 09/22/2019 8:30 AM Medical Record Number: [id number] Patient Account Number: [id number] Date of Birth/Sex: 1961/05/30 (58 y.o. M) Treating RN: [name] Primary Care Provider: [name] Other Clinician: Referring Provider: [name] Treating Provider/Extender: [name], Brylea Pita Weeks in Treatment: 3 History of Present Illness HPI Description: 01/27/19 patient presents today for initial evaluation here in our clinic concerning an issue he's been having with a wound at the right above knee amputation site which began shortly following the surgery in November 2019. 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 He has a history of hypertension, diabetes, congestive heart failure, and states that he is having a lot of discomfort at the wound site unfortunately. There does not appear to be any signs of active infection upon initial inspection today. No fevers, chills, nausea, or vomiting noted at this time.. Silvadene Cream has been utilized at some point on this wound as well. Patient did have home health coming out but unfortunately the patient and his wife state that they were not happy with the services being rendered and therefore discontinued the service. There does not appear to be any signs of otherwise active infection at this time which is good news. The patient states he's definitely ready for this to heal and has been a  symptomatic time since his surgery and he is becoming somewhat frustrated. Iodoflex also sounds to have previously been used on the wound that is when the patient's wife states the wound seem to get worse not better. As best I can tell [name] has not been utilized. 02/03/19 on evaluation today patient appears to be  doing much better compared to last week's evaluation in regard to his ulcer on the right amputation site. He's been tolerating the dressing changes without complication which is good news. The Santyl does seem to be of benefit in fact the wound appears to be doing significantly better compared to my prior evaluation. Overall very pleased in this regard. No fevers, chills, nausea, or vomiting noted at this time. 02/10/19 on evaluation today patient's wound actually appears to be showing signs of excellent improvement at this time which is great news. He has been tolerating the dressing changes without complication overall I feel like that there is no signs of active infection at this point which is also good news. I feel like that the [name] is very beneficial. Patient's wife likewise feels like things are doing quite well and is not having pain like he was in the past. 02/17/19 on evaluation today patient appears to be doing well in regard to the wound on the distal aspect of his right amputation site. He's been tolerating treatment with the Santyl at this point. I think he is nearing completion as far as treatment with cental is concerned I think were very close to getting this significantly better. Fortunately there's no evidence of infection currently. 03/03/19 on evaluation today patient actually appears to be doing quite well in regard to his his right imputation site ulceration. In fact there's a lot of new skin growth which is excellent news. I'm very pleased in this regard. Fortunately there's no signs of active infection also good news. No fevers, chills, nausea, or vomiting noted at  this time. 03/17/19 on evaluation today patient's wound actually appears to be showing signs of improvement which is good news. He does have a lot of issues however with concerns about how slowly this seems to be healing as far as he and his wife are concerned. I do believe some of the issues that were having currently are from the language barrier perspective but even that we have an interpreter that has been present for each evaluation I still think that there is some issues with their overall understanding of the treatment plan and how long wounds of this nature do take to heal. We began seeing him on April 14 and in just over a month and a half he has made improvement to where the wound is one third the size originally began. Nonetheless there still quite a bit of feeling left to do but again he has made great progress up to this point. Unfortunately he did have some issues with the silver collagen that was ordered he received something different from the [employer]. This was Colactive. Unfortunately this actually calls some burning he seemed to have some irritation from the dressing itself that caused irritation and redness around the edge of the wound that seems to be improving but nonetheless he and his wife both believe that the Santyl might've done better. They also really want to try to utilize something that will prevent me having to degree the wound as he typically has discomfort with the debridement obviously. 03/31/19 on evaluation today patient actually appears to be doing better in regard to his lower extremity ulcer. He's been tolerating the dressing changes without complication. Fortunately there's no signs of active infection at this time. He has a lot of new skin growth in the Santyl does seem to be working appropriately for him at this time. I'm very pleased in this regard. BRALYN, PESOLA ([id number]) 04/14/19 on evaluation today  patient appears to be doing quite well in regard to his  invitation site ulcer. This is a little smaller even than half the size of where it was when I saw him last. Fortunately there is no sign of active infection at this time which is good news. Overall he has been tolerating the dressing changes without complication still states that he has a lot of pain but again I really cannot see anything with regard to the wound that would be causing this and maybe more for neuropathy type pain. He states his weeks and up at night sometimes. 04/28/19-Patient presents at 2 weeks, with the right AKA wound, we have been using Santyl for this and there was mention of changing it to a different dressing this time around. 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 He's been tolerating the dressing changes with the Santyl unfortunately when we switched to the collagen dressing again last time he was seen in the office when I wasn't here his wound bed deteriorated as well in the following days. His wife came in to discuss this with me and subsequently we opted to go back to the [place] which seems to be doing excellent for him at this point. 05/26/2019 upon evaluation today patient actually appears to be doing excellent in regard to his wound on the right amputation site above the knee. In fact this appears to be almost completely healed which is great news. He has been utilizing Santyl. However he does have an issue on his left below-knee amputation stump where he has a wound as well he did not want to show me his wife wanted him to. In the end he decided he would and we did evaluate that today as well. It is not a very deep wound but nonetheless is something that I think needs to be addressed sooner rather than later to prevent any additional issues at this point. 06/09/19 on evaluation today patient's right distal above knee amputation  ulcer actually appears to be completely healed most likely. There may still be just a slight area where there some drainage but I really feel like most of the moisture is likely coming from the [place]. With that being said his left BKA ulcer is not doing quite as well there still a lot of Slough noted at this time and again there is some Epiboly as well this would require sharp debridement today. 06/23/2019 on evaluation today patient appears to be doing well with regard to his right ulcer on the distal stump. This actually is almost completely healed although there is a very tiny area still open. Subsequently he is also doing better with regard to the left distal stump location which he has been trying to be very careful about as far as not using his prosthesis anymore than he absolutely has to. I believe this shows and he has done a great job in this regard. 07/07/2019 on evaluation today patient actually appears to be doing well with regard to his amputation site on the right which is completely healed. On the left he is apparently been using his prosthesis way too much and I think that this has actually caused the area to have a more difficult time healing. Overall I am not is pleased with this although the wound bed has a good base to it there was some slough noted this can require debridement  the biggest issue that I see in my opinion is going to be getting him to not use his prosthesis in order to allow this to heal. Readmission: 09/01/2019 patient actually presents today for readmission into the clinic concerning issues that he has been having with a fistula surgery for his left forearm. He tells me that unfortunately this did not close appropriately and somewhat dehisced they attempted to glue this back together but did not take. Nonetheless he tells me that since that time he has been having really no significant pain but he is concerned about the fact that the wound does not seem to be  closing appropriately and obviously this is a fistula in his arm for dialysis access. 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 Overall he is somewhat frustrated at this point with how things appear in regard to the current surgical incision site. Nonetheless we did contact vascular we really have not heard as to whether or not we were taking over care of the wound at this point but nonetheless he is here in the office he was referred to [place] by vascular because home health was not good to be seeing the patient and nonetheless for the time being I am going to go ahead and initiate therapy if that is not what they would like for [place] to do than they can always take over care at a later point. 09/08/2019 on evaluation today patient actually appears to be showing some signs of improvement with regard to his ulcer on the left medial forearm. He has been tolerating the dressing changes without complication. The Santyl does seem to be benefiting him at this point based on what I am seeing. Fortunately there is no signs of active infection at this time. He does still have some fairly significant and thick slough noted over the surface of the wound unfortunately. 09/22/19 on evaluation today I'm actually evaluating this patient while he is in the clinic and I am remote secondary to having to be screened for Covid-19 Virus. patient appears to be doing very well with regard to his ulcer at the surgical site. This seems Aden, Dabid L. ([id number]) to be healing quite nicely in sentencing to be doing an excellent job. Overall I'm very pleased with how things appear. His measurements are quite a bit smaller today as well. Electronic Signature(s) Signed: 09/22/2019 9:52:07 AM By: Worthy Keeler PA-C Entered By: Worthy Keeler on 09/22/2019 08:58:02 [name]  ([id number]) -------------------------------------------------------------------------------- Physical Exam Details Patient Name: [name], [name] Date of Service: 09/22/2019 8:30 AM Medical Record Number: [id number] Patient Account Number: [id number] Date of Birth/Sex: [birth year] (58 y.o. M) Treating RN: [name] Primary Care Provider: [name] Other Clinician: Referring Provider: [name] Treating Provider/Extender: STONE III, Inola Lisle Weeks in Treatment: 3 Constitutional sitting or standing blood pressure is within target range for patient.. Well-nourished and well-hydrated in no acute distress. Respiratory normal breathing without difficulty. Psychiatric this patient is able to make decisions and demonstrates good insight into disease process. Alert and Oriented x 3. pleasant and cooperative. Notes Patient's wound bed currently showed signs of good granulation epithelialization around the edges of the wound he does have some issues with the slough still in the central portion of the wound although there is [facility] here there's no signs of active infection which is good news and in  general he seems that much less left than was noted last visit. Overall I'm very pleased at this point with how things seem to be progressing. Electronic Signature(s) Signed: 09/22/2019 9:52:07 AM By: Worthy Keeler PA-C Entered By: Worthy Keeler on 09/22/2019 09:02:33 [name] ([id number]) -------------------------------------------------------------------------------- Physician Orders Details Patient Name: [name], [name] Date of Service: 09/22/2019 8:30 AM Medical Record Number: [id number] Patient Account Number: [id number] Date of Birth/Sex: Aug 17, 1961 (58 y.o. 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 o May shower with protection. - Please do not get your wound wet Primary Wound Dressing Wound #3 Left,Medial Forearm o Santyl Ointment - to wound bed o Xeroform - over santyl Secondary Dressing Wound #3 Left,Medial Forearm o ABD pad - secured with netting Dressing Change Frequency Wound #3 Left,Medial Forearm o Change dressing every day. Follow-up Appointments Wound #3 Left,Medial Forearm o Return Appointment in 1 week. Electronic Signature(s) Signed: 09/22/2019 9:52:07 AM By: Worthy Keeler PA-C Signed: 09/22/2019 4:13:22 PM By: [name] Entered By: [name] on 09/22/2019 08:46:11 [name] ([id number]) [name], [name] ([id number]) -------------------------------------------------------------------------------- Problem List Details Patient Name: [name], [name] Date of Service: 09/22/2019 8:30 AM Medical Record Number: [id number] Patient Account Number: [id number] Date of Birth/Sex: 1960/11/22 (58 y.o. 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 with fat layer 09/01/2019 No Yes exposed E11.622 Type 2 diabetes mellitus with other skin ulcer 09/01/2019 No Yes Z89.611 Acquired absence of right leg above knee 09/01/2019 No Yes Z89.512 Acquired absence of left leg below knee 09/01/2019 No Yes N18.6 End stage renal disease 09/01/2019 No Yes Z99.2 Dependence on renal dialysis 09/01/2019 No Yes I10 Essential (primary) hypertension 09/01/2019 No Yes I48.0 Paroxysmal atrial fibrillation 09/01/2019 No Yes Inactive Problems Resolved Problems LAYN, PLETT ([id number]) Electronic Signature(s) Signed: 09/22/2019 9:52:07 AM By: Worthy Keeler PA-C Entered By: Worthy Keeler on 09/22/2019 08:40:34 Ransier, [name] ([id number]) -------------------------------------------------------------------------------- Progress Note Details Patient Name: [name], [name] Date of Service: 09/22/2019 8:30 AM Medical Record Number: [id number] Patient Account Number: [id number] Date of Birth/Sex: 05-Sep-1961 (58 y.o. 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 He has a left below  knee imputation which has also previously been performed in 2015 he wears a prosthesis on the left lower extremity. 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 He has a history of hypertension, diabetes, congestive heart failure, and states that he  is having a lot of discomfort at the wound site unfortunately. There does not appear to be any signs of active infection upon initial inspection today. No fevers, chills, nausea, or vomiting noted at this time.. Silvadene Cream has been utilized at some point on this wound as well. Patient did have home health coming out but unfortunately the patient and his wife state that they were not happy with the services being rendered and therefore discontinued the service. There does not appear to be any signs of otherwise active infection at this time which is good news. The patient states he's definitely ready for this to heal and has been a symptomatic time since his surgery and he is becoming somewhat frustrated. Iodoflex also sounds to have previously been used on the wound that is when the patient's wife states the wound seem to get worse not better. As best I can tell [name] has not been utilized. 02/03/19 on evaluation today patient appears to be doing much better compared to last week's evaluation in regard to his ulcer on the right amputation site. He's been tolerating the dressing changes without complication which is good news. The Santyl does seem to be of benefit in fact the wound appears to be doing significantly better compared to my prior evaluation. Overall very pleased in this regard. No fevers, chills, nausea, or vomiting noted at this time. 02/10/19 on evaluation today patient's wound actually appears to be showing signs of excellent improvement at this time which is great news. He has been tolerating the dressing changes without complication overall I feel like that there is no signs of active infection at this point which is also good news. I feel like that the [name] is very beneficial. Patient's wife likewise feels like things are doing quite well and is not having pain like he was in the past. 02/17/19 on evaluation today patient appears to be doing well in regard to the wound on the  distal aspect of his right amputation site. He's been tolerating treatment with the Santyl at this point. I think he is nearing completion as far as treatment with cental is concerned I think were very close to getting this significantly better. Fortunately there's no evidence of infection currently. 03/03/19 on evaluation today patient actually appears to be doing quite well in regard to his his right imputation site ulceration. In fact there's a lot of new skin growth which is excellent news. I'm very pleased in this regard. Fortunately there's no signs of active infection also good news. No fevers, chills, nausea, or vomiting noted at this time. 03/17/19 on evaluation today patient's wound actually appears to be showing signs of improvement which is good news. He does have a lot of issues however with concerns about how slowly this seems to be healing as far as he and his wife are concerned. I do believe some of the issues that were having currently are from the language barrier perspective but even that we have an interpreter that has been present for each evaluation I still think that there is some issues with their overall understanding of the treatment plan and how long wounds of this nature do take to heal. We began seeing him  on April 14 and in just over a month and a half he has made improvement to where the wound is one third the size originally began. Nonetheless there still quite a bit of feeling left to do but again he has made great progress up to this point. Unfortunately he did have some issues with the silver collagen that was ordered he received something different from the [employer]. This was Colactive. Unfortunately this actually calls some burning he seemed to have some irritation from the dressing itself that caused irritation and redness around the edge of the wound that seems to be improving but nonetheless he and his wife both [name], [name] ([id number]) believe that the Santyl  might've done better. They also really want to try to utilize something that will prevent me having to degree the wound as he typically has discomfort with the debridement obviously. 03/31/19 on evaluation today patient actually appears to be doing better in regard to his lower extremity ulcer. He's been tolerating the dressing changes without complication. Fortunately there's no signs of active infection at this time. He has a lot of new skin growth in the Santyl does seem to be working appropriately for him at this time. I'm very pleased in this regard. 04/14/19 on evaluation today patient appears to be doing quite well in regard to his invitation site ulcer. This is a little smaller even than half the size of where it was when I saw him last. Fortunately there is no sign of active infection at this time which is good news. Overall he has been tolerating the dressing changes without complication still states that he has a lot of pain but again I really cannot see anything with regard to the wound that would be causing this and maybe more for neuropathy type pain. He states his weeks and up at night sometimes. 04/28/19-Patient presents at 2 weeks, with the right AKA wound, we have been using Santyl for this and there was mention of changing it to a different dressing this time around. 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 He's been tolerating the dressing changes with the Santyl unfortunately when we switched to the collagen dressing again last time he was seen in the office when I wasn't here his wound bed deteriorated as well in the following days. His wife came in to discuss this with me and subsequently we opted to go back to the [place] which seems to be doing excellent for him at this point. 05/26/2019 upon evaluation today patient actually appears to be  doing excellent in regard to his wound on the right amputation site above the knee. In fact this appears to be almost completely healed which is great news. He has been utilizing Santyl. However he does have an issue on his left below-knee amputation stump where he has a wound as well he did not want to show me his wife wanted him to. In the end he decided he would and we did evaluate that today as well. It is not a very deep wound but nonetheless is something that I think needs to be addressed sooner rather than later to prevent any additional issues at this point. 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 There may still be just a slight area where there some drainage but I really  feel like most of the moisture is likely coming from the [place]. With that being said his left BKA ulcer is not doing quite as well there still a lot of Slough noted at this time and again there is some Epiboly as well this would require sharp debridement today. 06/23/2019 on evaluation today patient appears to be doing well with regard to his right ulcer on the distal stump. This actually is almost completely healed although there is a very tiny area still open. Subsequently he is also doing better with regard to the left distal stump location which he has been trying to be very careful about as far as not using his prosthesis anymore than he absolutely has to. I believe this shows and he has done a great job in this regard. 07/07/2019 on evaluation today patient actually appears to be doing well with regard to his amputation site on the right which is completely healed. On the left he is apparently been using his prosthesis way too much and I think that this has actually caused the area to have a more difficult time healing. Overall I am not is pleased with this although the wound bed has a good base to it there was some slough  noted this can require debridement the biggest issue that I see in my opinion is going to be getting him to not use his prosthesis in order to allow this to heal. Readmission: 09/01/2019 patient actually presents today for readmission into the clinic concerning issues that he has been having with a fistula surgery for his left forearm. He tells me that unfortunately this did not close appropriately and somewhat dehisced they attempted to glue this back together but did not take. Nonetheless he tells me that since that time he has been having really no significant pain but he is concerned about the fact that the wound does not seem to be closing appropriately and obviously this is a fistula in his arm for dialysis access. 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 Overall he is somewhat frustrated at this point with how things appear in regard to the current surgical incision site. Nonetheless we did contact vascular we really have not heard as to whether or not we were taking over care of the wound at this point but nonetheless he is here in the office he was referred to [place] by vascular because home health was not good to be seeing the patient and nonetheless for the time being I am going to go ahead and initiate therapy if that is not what they would like for [place] to do than they can always take over care at a later point. 09/08/2019 on evaluation today patient actually appears to be showing some signs of improvement with regard to his ulcer on the left medial forearm. He has been tolerating the dressing changes without complication. The Santyl does seem to be [name], [name] ([id number]) benefiting him at this point based on what I am seeing. Fortunately there is no signs of active infection at this time. He does still have some fairly significant and thick slough noted over the  surface of the wound unfortunately. 09/22/19 on evaluation today I'm actually evaluating this patient while he is in the clinic and I am remote secondary to having to be screened for Covid-19 Virus. patient appears to be doing very  well with regard to his ulcer at the surgical site. This seems to be healing quite nicely in sentencing to be doing an excellent job. Overall I'm very pleased with how things appear. His measurements are quite a bit smaller today as well. Patient History Information obtained from Patient. Family History Cancer - Father, Diabetes - Siblings, No family history of Hereditary Spherocytosis. Social History Former smoker - ended on 10/16/1994, Marital Status - Married, Alcohol Use - Never, Drug Use - No History, Caffeine Use - Rarely. Medical History Cardiovascular Patient has history of Arrhythmia - A-fib, Congestive Heart Failure, Hypertension, Peripheral Arterial Disease - Bilateral LE amputations Endocrine Patient has history of Type II Diabetes Genitourinary Patient has history of End Stage Renal Disease - Dialysis M,W,F Hospitalization/Surgery History - Right AKA. - Left BKA. 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 Respiratory Denies complaints or symptoms of Chronic or frequent coughs, Shortness of Breath. Cardiovascular Denies complaints or symptoms of Chest pain, LE edema. Psychiatric Denies complaints or symptoms of Anxiety, Claustrophobia. Objective Constitutional Well-nourished and well-hydrated in no acute distress. Vitals Time Taken: 8:32 AM, Height: 67 in, Weight: 212 lbs, BMI: 33.2, Temperature: 98.6 F, Pulse: 69 bpm, Respiratory Varano, Otilio L. ([id number]) Rate: 16 breaths/min, Blood  Pressure: 105/36 mmHg. Respiratory normal breathing without difficulty. clear to auscultation bilaterally. Cardiovascular regular rate and rhythm with normal S1, S2. Psychiatric this patient is able to make decisions and demonstrates good insight into disease process. Alert and Oriented x 3. pleasant and cooperative. General Notes: Patient's wound bed currently showed signs of good granulation epithelialization around the edges of the wound he does have some issues with the slough still in the central portion of the wound although there is [facility] here there's no signs of active infection which is good news and in general he seems that much less left than was noted last visit. Overall I'm very pleased at this point with how things seem to be progressing. Integumentary (Hair, Skin) Wound #3 status is Open. Original cause of wound was Surgical Injury. The wound is located on the Left,Medial Forearm. The wound measures 0.7cm length x 2.3cm width x 0.2cm depth; 1.264cm^2 area and 0.253cm^3 volume. There is Fat Layer (Subcutaneous Tissue) Exposed exposed. There is no tunneling or undermining noted. There is a medium amount of serous drainage noted. The wound margin is flat and intact. There is no granulation within the wound bed. There is a large (67-100%) amount of necrotic tissue within the wound bed including Eschar and Adherent Slough. 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  There was a Chemical/Enzymatic/Mechanical debridement performed by [name], RN. With the following instrument(s): tongue blade after achieving pain control using Lidocaine 4% Topical Solution. Agent used was [employer]. A time out was conducted at 08:47, prior to the start of the procedure. There was no bleeding. The procedure was tolerated well with a  pain level of 0 throughout and a pain level of 0 following the procedure. Post Debridement Measurements: 0.7cm length x 2.3cm width x 0.2cm depth; 0.253cm^3 volume. [name], GLENDENING ([id number]) Character of Wound/Ulcer Post Debridement is improved. Post procedure Diagnosis Wound #3: Same as Pre-Procedure Plan Wound Cleansing: Wound #3 Left,Medial Forearm: Clean wound with Normal Saline. May shower with protection. - Please do not get your wound wet Primary Wound Dressing: Wound #3 Left,Medial Forearm: Santyl Ointment - to wound bed Xeroform - over santyl Secondary Dressing: Wound #3 Left,Medial Forearm: ABD pad - secured with netting Dressing Change Frequency: Wound #3 Left,Medial Forearm: Change dressing every day. Follow-up Appointments: Wound #3 Left,Medial Forearm: Return Appointment in 1 week. 1.At this point my suggestion for the patient is gonna be that we go ahead and continue with the Santyl ointment since that seems to be beneficial for him and the patient is in agreement with plan. 2. I'm going to recommend that the patient continue with the stretch netting to cover in order to avoid any adhesive's on his skin. 3. With regard to the fact that the patient needs to be seen by vascular for this wound I am gonna go ahead and suggest that he make this appointment with them as soon as possible. Apparently when he went on December 1 they told him he didn't have an appointment he did not schedule anything further. Nonetheless he really does need to do so in my opinion. Please see above for specific wound care orders. We will see  patient for re-evaluation in 1 week(s) here in the clinic. If anything worsens or changes patient will contact our office for additional recommendations. Electronic Signature(s) Signed: 09/22/2019 9:52:07 AM By: Worthy Keeler PA-C Entered By: Worthy Keeler on 09/22/2019 09:01:15 [name] ([id number]) -------------------------------------------------------------------------------- ROS/PFSH Details Patient Name: [name], Sung L. Date of Service: 09/22/2019 8:30 AM Medical Record Number: [id number] Patient Account Number: [id number] Date of Birth/Sex: 1961-10-15 (58 y.o. 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 Respiratory Complaints and Symptoms: Negative for: Chronic or frequent coughs; Shortness of Breath Cardiovascular Complaints and Symptoms: Negative for: Chest pain; LE edema Medical History: Positive for: Arrhythmia - A-fib; Congestive Heart Failure; Hypertension; Peripheral Arterial Disease - Bilateral LE amputations Psychiatric Complaints and Symptoms: Negative for: Anxiety; Claustrophobia Endocrine Medical History: Positive for: Type II Diabetes Time with diabetes: 14 years Treated with: Oral agents Blood sugar tested every day: No Blood sugar testing results: Breakfast: 128; Lunch: 145 Genitourinary Medical History: Positive for: End Stage Renal Disease - Dialysis M,W,F Immunizations [name], [name] ([id number]) Pneumococcal Vaccine: Received Pneumococcal Vaccination: Yes Implantable Devices None Hospitalization / Surgery  History Type of Hospitalization/Surgery Right AKA Left BKA Family and Social History Cancer: Yes - Father; Diabetes: Yes - Siblings; Hereditary Spherocytosis: No; Former smoker - ended on 10/16/1994; Marital Status - Married; Alcohol Use: Never; Drug Use: No History; Caffeine Use: Rarely Physician Affirmation I have reviewed and agree with the above information. Electronic Signature(s) Signed: 09/22/2019 9:52:07 AM By: Worthy Keeler PA-C Signed: 09/22/2019 4:13:22 PM By: [name] Entered By: Worthy Keeler on 09/22/2019 08:56:52 [name] ([id number]) -------------------------------------------------------------------------------- SuperBill Details Patient Name: Kristopher Haggard  L. Date of Service: 09/22/2019 Medical Record Number: [id number] Patient Account Number: [id number] Date of Birth/Sex: 1961/05/06 (58 y.o. M) Treating RN: [name] Primary Care Provider: [name] Other Clinician: Referring Provider: [name] Treating Provider/Extender: [name], Clance Baquero Weeks in Treatment: 3 Diagnosis Coding ICD-10 Codes Code Description T81.31XA Disruption of external operation (surgical) wound, not elsewhere classified, initial encounter L98.492 Non-pressure chronic ulcer of skin of other sites with fat layer exposed E11.622 Type 2 diabetes mellitus with other skin ulcer Z89.611 Acquired absence of right leg above knee Z89.512 Acquired absence of left leg below knee N18.6 End stage renal disease Z99.2 Dependence on renal dialysis I10 Essential (primary) hypertension I48.0 Paroxysmal atrial fibrillation Facility Procedures CPT4 Code: [id number] Description: [id number] - [place]BR[place] W/O ANES NON SELECT Modifier: Quantity: 1 CPT4 Code: [id number] Description: [id number] Telehealth originating site facility fee. Modifier: Quantity: 1 Physician Procedures CPT4: Description Modifier Quantity Code [id number] - WC PHYS LEVEL 3 - EST PT 1 ICD-10 Diagnosis Description T81.31XA  Disruption of external operation (surgical) wound, not elsewhere classified, initial encounter L98.492 Non-pressure chronic ulcer of  skin of other sites with fat layer exposed E11.622 Type 2 diabetes mellitus with other skin ulcer Z89.611 Acquired absence of right leg above knee Electronic Signature(s) Signed: 09/22/2019 10:11:33 AM By: Gretta Cool, BSN, RN, CWS, Kim RN, BSN Signed: 09/22/2019 12:03:29 PM By: Worthy Keeler PA-C Previous Signature: 09/22/2019 9:52:07 AM Version By: Worthy Keeler PA-C Entered By: Gretta Cool, BSN, RN, CWS, Kim on 09/22/2019 10:11:33

## 2019-09-29 ENCOUNTER — Other Ambulatory Visit: Payer: Self-pay

## 2019-09-29 ENCOUNTER — Encounter: Payer: Medicare Other | Admitting: Physician Assistant

## 2019-09-29 DIAGNOSIS — E11622 Type 2 diabetes mellitus with other skin ulcer: Secondary | ICD-10-CM | POA: Diagnosis not present

## 2019-09-29 NOTE — Progress Notes (Addendum)
Kristopher, [name] ([id number]) Visit Report for 09/29/2019 Arrival Information Details Patient Name: [name], [name] Date of Service: 09/29/2019 8:30 AM Medical Record Number: [id number] Patient Account Number: [id number] Date of Birth/Sex: Feb 16, 1961 (58 y.o. M) Treating RN: [name] Primary Care [name]: [name] Other Clinician: Referring [name]: [name] Treating [name]/Extender: [name], [name] [name]: 4 Visit Information History Since Last Visit Added or deleted any medications: No Patient Arrived: Wheel Chair Any new allergies or adverse reactions: No Arrival Time: 08:26 Had a fall or experienced change in No Accompanied By: wife activities of daily living that may affect Transfer Assistance: None risk of falls: Patient Identification Verified: Yes Signs or symptoms of abuse/neglect since last visito No Hospitalized since last visit: No Has Dressing in Place as Prescribed: Yes Pain Present Now: No Electronic Signature(s) Signed: 09/29/2019 4:14:53 PM By: [name] Entered By: [name] on 09/29/2019 08:26:39 [name] ([id number]) -------------------------------------------------------------------------------- Encounter Discharge Information Details Patient Name: [name], [name] Date of Service: 09/29/2019 8:30 AM Medical Record Number: [id number] Patient Account Number: [id number] Date of Birth/Sex: [birth year] (58 y.o. M) Treating RN: [name] Primary Care Meleny Tregoning: [name] Other Clinician: Referring [name]: [name] Treating [name]/Extender: [name], [name] [name]: 4 Encounter Discharge Information Items Post Procedure Vitals Discharge Condition: Stable Temperature (F): 97.9 Ambulatory Status: Wheelchair Pulse (bpm): 70 Discharge Destination: Home Respiratory Rate (breaths/min): 16 Transportation: Private Auto Blood Pressure (mmHg): 103/50 Accompanied By: wife Schedule Follow-up  Appointment: Yes Clinical Summary of Care: Electronic Signature(s) Signed: 09/29/2019 4:30:49 PM By: [name] Entered By: [name] on 09/29/2019 08:55:11 [name] ([id number]) -------------------------------------------------------------------------------- Lower Extremity Assessment Details Patient Name: [name], [name] Date of Service: 09/29/2019 8:30 AM Medical Record Number: [id number] Patient Account Number: [id number] Date of Birth/Sex: July 13, 1961 (58 y.o. M) Treating RN: [name] Primary Care Tytus Strahle: [name] Other Clinician: Referring Nakeda Lebron: [name] Treating Lateasha Breuer/Extender: [name], [name] [name]: 4 Electronic Signature(s) Signed: 09/29/2019 4:14:53 PM By: [name] Entered By: [name] on 09/29/2019 08:28:20 [name] ([id number]) -------------------------------------------------------------------------------- Multi Wound Chart Details Patient Name: [name], [name] Date of Service: 09/29/2019 8:30 AM Medical Record Number: [id number] Patient Account Number: [id number] Date of Birth/Sex: 12-04-1960 (58 y.o. M) Treating RN: [name] Primary Care Conception Doebler: [name] Other Clinician: Referring Nekayla Heider: [name] Treating Rhyland Hinderliter/Extender: STONE III, [name] [name]: 4 Vital Signs Height(in): 67 Pulse(bpm): 70 Weight(lbs): 212 Blood Pressure(mmHg): 103/50 Body Mass Index(BMI): 33 Temperature(F): 97.9 Respiratory Rate 16 (breaths/min): Photos: [N/A:N/A] Wound Location: Left Forearm - Medial N/A N/A Wounding Event: Surgical Injury N/A N/A Primary Etiology: Dehisced Wound N/A N/A Comorbid History: Arrhythmia, Congestive Heart N/A N/A Failure, Hypertension, Peripheral Arterial Disease, Type II Diabetes, End Stage Renal Disease Date Acquired: 07/30/2019 N/A N/A Kristopher of [name]: 4 N/A N/A Wound Status: Open N/A N/A Measurements L x W x D 0.5x2x0.1 N/A N/A (cm) Area (cm)  : 0.785 N/A N/A Volume (cm) : 0.079 N/A N/A % Reduction in Area: 80.80% N/A N/A % Reduction in Volume: 97.90% N/A N/A Classification: Full Thickness Without N/A N/A Exposed Support Structures Exudate Amount: Medium N/A N/A Exudate Type: Serous N/A N/A Exudate Color: amber N/A N/A Wound Margin: Flat and Intact N/A N/A Granulation Amount: Small (1-33%) N/A N/A Necrotic Amount: Large (67-100%) N/A N/A Necrotic Tissue: Eschar, Adherent Slough N/A N/A Exposed Structures: Fat Layer (Subcutaneous N/A N/A Tissue) Exposed: Yes Epithelialization: Medium (34-66%) N/A N/A [name] ([id number]) [name] Notes Electronic Signature(s) Signed: 09/29/2019  4:30:49 PM By: [name] Entered By: [name] on 09/29/2019 08:51:55 [name] ([id number]) -------------------------------------------------------------------------------- [place] Details Patient Name: [name], [name] Date of Service: 09/29/2019 8:30 AM Medical Record Number: [id number] Patient Account Number: [id number] Date of Birth/Sex: [birth year] (58 y.o. M) Treating RN: [name] Primary Care Graham Doukas: [name] Other Clinician: Referring Kiaria Quinnell: [name] Treating Seraphina Mitchner/Extender: [name], [name] [name]: 4 Active Inactive Electronic Signature(s) Signed: 10/27/2019 9:00:02 AM By: [name], BSN, RN, CWS, Kim RN, BSN Signed: 12/04/2019 12:02:54 PM By: [name] Previous Signature: 09/29/2019 4:30:49 PM Version By: [name] Entered By: [name] BSN, RN, CWS, [name] on 10/27/2019 [name] ([id number]) -------------------------------------------------------------------------------- Pain Assessment Details Patient Name: [name], [name] Date of Service: 09/29/2019 8:30 AM Medical Record Number: [id number] Patient Account Number: [id number] Date of Birth/Sex: March 18, 1961 (58 y.o. M) Treating RN: [name] Primary Care Alioune Hodgkin: [name] Other  Clinician: Referring Lockie Bothun: [name] Treating Millette Halberstam/Extender: [name], [name] [name]: 4 Active Problems Location of Pain Severity and Description of Pain Patient Has Paino No Site Locations Pain Management and Medication Current Pain Management: Electronic Signature(s) Signed: 09/29/2019 4:14:53 PM By: [name] Entered By: [name] on 09/29/2019 08:26:44 [name] ([id number]) -------------------------------------------------------------------------------- Patient/Caregiver Education Details Patient Name: [name], [name] Date of Service: 09/29/2019 8:30 AM Medical Record Number: [id number] Patient Account Number: [id number] Date of Birth/Gender: [birth year] (58 y.o. M) Treating RN: [name] Primary Care Physician: [name] Other Clinician: Referring Physician: Royetta [name] Treating Physician/Extender: Sharalyn Ink in [name]: 4 Education Assessment Education Provided To: Patient and Caregiver Education Topics Provided Wound/Skin Impairment: Handouts: Other: wound care as ordered Methods: Demonstration, Explain/Verbal Responses: State content correctly Electronic Signature(s) Signed: 09/29/2019 4:30:49 PM By: [name] Entered By: [name] on 09/29/2019 08:54:33 Demo, [name] ([id number]) -------------------------------------------------------------------------------- Wound Assessment Details Patient Name: Coba, [name] Date of Service: 09/29/2019 8:30 AM Medical Record Number: [id number] Patient Account Number: [id number] Date of Birth/Sex: [birth year] (58 y.o. M) Treating RN: [name] Primary Care Bolden Hagerman: [name] Other Clinician: Referring Tamora Huneke: [name] Treating Lachell Rochette/Extender: STONE III, [name] [name]: 4 Wound Status Wound Number: 3 Primary Dehisced Wound Etiology: Wound Location: Left Forearm - Medial Wound Open Wounding Event: Surgical  Injury Status: Date Acquired: 07/30/2019 Comorbid Arrhythmia, Congestive Heart Failure, Kristopher Of [name]: 4 History: Hypertension, Peripheral Arterial Disease, Type Clustered Wound: No II Diabetes, End Stage Renal Disease Photos Wound Measurements Length: (cm) 0.5 Width: (cm) 2 Depth: (cm) 0.1 Area: (cm) 0.785 Volume: (cm) 0.079 % Reduction in Area: 80.8% % Reduction in Volume: 97.9% Epithelialization: Medium (34-66%) Undermining: No Wound Description Full Thickness Without Exposed Support Classification: Structures Wound Margin: Flat and Intact Exudate Medium Amount: Exudate Type: Serous Exudate Color: amber Foul Odor After Cleansing: No Slough/Fibrino No Wound Bed Granulation Amount: Small (1-33%) Exposed Structure Necrotic Amount: Large (67-100%) Fat Layer (Subcutaneous Tissue) Exposed: Yes Necrotic Quality: Eschar, Adherent Slough Electronic Signature(s) Signed: 09/29/2019 4:14:53 PM By: [name] Entered By: [name] on 09/29/2019 08:28:10 [name] ([id number]) Helvie, [name] ([id number]) -------------------------------------------------------------------------------- Vitals Details Patient Name: [name], [name] Date of Service: 09/29/2019 8:30 AM Medical Record Number: [id number] Patient Account Number: [id number] Date of Birth/Sex: [birth year] (58 y.o. M) Treating RN: [name] Primary Care Ileen Kahre: [name] Other Clinician: Referring Latoyna Hird: [name] Treating Ladana Chavero/Extender: STONE III, [name] [name]: 4 Vital Signs Time Taken: 08:26 Temperature (F): 97.9 Height (in): 67 Pulse (bpm): 70 Weight (lbs): 212 Respiratory  Rate (breaths/min): 16 Body Mass Index (BMI): 33.2 Blood Pressure (mmHg): 103/50 Reference Range: 80 - 120 mg / dl Electronic Signature(s) Signed: 09/29/2019 4:14:53 PM By: [name] Entered By: [name] on 09/29/2019 08:27:02

## 2019-09-29 NOTE — Progress Notes (Addendum)
[name], [name] ([id number]) Visit Report for 09/29/2019 Chief Complaint Document Details Patient Name: [name], [name] Date of Service: 09/29/2019 8:30 AM Medical Record Number: [id number] Patient Account Number: [id number] Date of Birth/Sex: 04-09-1961 (58 y.o. M) Treating RN: [name] Primary Care Provider: [name] Other Clinician: Referring Provider: [name] Treating Provider/Extender: [name], Channa Hazelett Weeks in Treatment: 4 Information Obtained from: Patient Chief Complaint Left forearm surgical ulcer Electronic Signature(s) Signed: 09/29/2019 8:34:57 AM By: Worthy Keeler PA-C Entered By: Worthy Keeler on 09/29/2019 08:34:57 [name] ([id number]) -------------------------------------------------------------------------------- Debridement Details Patient Name: [name], [name] Date of Service: 09/29/2019 8:30 AM Medical Record Number: [id number] Patient Account Number: [id number] Date of Birth/Sex: Sep 19, 1961 (58 y.o. M) Treating RN: [name] Primary Care Provider: [name] Other Clinician: Referring Provider: [name] Treating Provider/Extender: [name], Maeven Mcdougall Weeks in Treatment: 4 Debridement Performed for Wound #3 Left,Medial Forearm Assessment: Performed By: Clinician [name], RN Debridement Type: Chemical/Enzymatic/Mechanical Agent Used: Santyl Level of Consciousness (Pre- Awake and Alert procedure): Pre-procedure Verification/Time Yes - 08:53 Out Taken: Start Time: 08:53 Pain Control: Lidocaine 4% Topical Solution Instrument: Other : tongue blade Bleeding: None End Time: 08:54 Procedural Pain: 0 Post Procedural Pain: 0 Response to Treatment: Procedure was tolerated well Level of Consciousness Awake and Alert (Post-procedure): Post Debridement Measurements of Total Wound Length: (cm) 0.5 Width: (cm) 2 Depth: (cm) 0.1 Volume: (cm) 0.079 Character of Wound/Ulcer Post Debridement: Improved Post  Procedure Diagnosis Same as Pre-procedure Electronic Signature(s) Signed: 09/29/2019 4:30:49 PM By: [name] Signed: 09/30/2019 6:11:58 PM By: Worthy Keeler PA-C Entered By: [name] on 09/29/2019 08:53:50 [name], [name] ([id number]) -------------------------------------------------------------------------------- HPI Details Patient Name: [name], [name] Date of Service: 09/29/2019 8:30 AM Medical Record Number: [id number] Patient Account Number: [id number] Date of Birth/Sex: 1961-01-29 (58 y.o. M) Treating RN: [name] Primary Care Provider: [name] Other Clinician: Referring Provider: [name] Treating Provider/Extender: [name], Attie Nawabi Weeks in Treatment: 4 History of Present Illness HPI Description: 01/27/19 patient presents today for initial evaluation here in our clinic concerning an issue he's been having with a wound at the right above knee amputation site which began shortly following the surgery in November 2019. 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 He has a history of hypertension, diabetes, congestive heart failure, and states that he is having a lot of discomfort at the wound site unfortunately. There does not appear to be any signs of active infection upon initial inspection today. No fevers, chills, nausea, or vomiting noted at this time.. Silvadene Cream has been utilized at some point on this wound as well. Patient did have home health coming out but unfortunately the patient and his wife state that they were not happy with the services being rendered and therefore discontinued the service. There does not appear to be any signs of otherwise active infection at this time which is good news. The patient states he's definitely ready for this to heal  and has been a symptomatic time since his surgery and he is becoming somewhat frustrated. Iodoflex also sounds to have previously been used on the wound that is when the patient's wife states the wound seem to get worse not better. As best I can tell [name] has not been utilized. 02/03/19 on evaluation today patient appears to be  doing much better compared to last week's evaluation in regard to his ulcer on the right amputation site. He's been tolerating the dressing changes without complication which is good news. The Santyl does seem to be of benefit in fact the wound appears to be doing significantly better compared to my prior evaluation. Overall very pleased in this regard. No fevers, chills, nausea, or vomiting noted at this time. 02/10/19 on evaluation today patient's wound actually appears to be showing signs of excellent improvement at this time which is great news. He has been tolerating the dressing changes without complication overall I feel like that there is no signs of active infection at this point which is also good news. I feel like that the [name] is very beneficial. Patient's wife likewise feels like things are doing quite well and is not having pain like he was in the past. 02/17/19 on evaluation today patient appears to be doing well in regard to the wound on the distal aspect of his right amputation site. He's been tolerating treatment with the Santyl at this point. I think he is nearing completion as far as treatment with cental is concerned I think were very close to getting this significantly better. Fortunately there's no evidence of infection currently. 03/03/19 on evaluation today patient actually appears to be doing quite well in regard to his his right imputation site ulceration. In fact there's a lot of new skin growth which is excellent news. I'm very pleased in this regard. Fortunately there's no signs of active infection also good news. No fevers, chills, nausea, or  vomiting noted at this time. 03/17/19 on evaluation today patient's wound actually appears to be showing signs of improvement which is good news. He does have a lot of issues however with concerns about how slowly this seems to be healing as far as he and his wife are concerned. I do believe some of the issues that were having currently are from the language barrier perspective but even that we have an interpreter that has been present for each evaluation I still think that there is some issues with their overall understanding of the treatment plan and how long wounds of this nature do take to heal. We began seeing him on April 14 and in just over a month and a half he has made improvement to where the wound is one third the size originally began. Nonetheless there still quite a bit of feeling left to do but again he has made great progress up to this point. Unfortunately he did have some issues with the silver collagen that was ordered he received something different from the [employer]. This was Colactive. Unfortunately this actually calls some burning he seemed to have some irritation from the dressing itself that caused irritation and redness around the edge of the wound that seems to be improving but nonetheless he and his wife both believe that the Santyl might've done better. They also really want to try to utilize something that will prevent me having to degree the wound as he typically has discomfort with the debridement obviously. 03/31/19 on evaluation today patient actually appears to be doing better in regard to his lower extremity ulcer. He's been tolerating the dressing changes without complication. Fortunately there's no signs of active infection at this time. He has a lot of new skin growth in the Santyl does seem to be working appropriately for him at this time. I'm very pleased in this regard. [name], [name] ([id number]) 04/14/19 on evaluation today  patient appears to be doing quite well  in regard to his invitation site ulcer. This is a little smaller even than half the size of where it was when I saw him last. Fortunately there is no sign of active infection at this time which is good news. Overall he has been tolerating the dressing changes without complication still states that he has a lot of pain but again I really cannot see anything with regard to the wound that would be causing this and maybe more for neuropathy type pain. He states his weeks and up at night sometimes. 04/28/19-Patient presents at 2 weeks, with the right AKA wound, we have been using Santyl for this and there was mention of changing it to a different dressing this time around. 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 He's been tolerating the dressing changes with the Santyl unfortunately when we switched to the collagen dressing again last time he was seen in the office when I wasn't here his wound bed deteriorated as well in the following days. His wife came in to discuss this with me and subsequently we opted to go back to the [place] which seems to be doing excellent for him at this point. 05/26/2019 upon evaluation today patient actually appears to be doing excellent in regard to his wound on the right amputation site above the knee. In fact this appears to be almost completely healed which is great news. He has been utilizing Santyl. However he does have an issue on his left below-knee amputation stump where he has a wound as well he did not want to show me his wife wanted him to. In the end he decided he would and we did evaluate that today as well. It is not a very deep wound but nonetheless is something that I think needs to be addressed sooner rather than later to prevent any additional issues at this point. 06/09/19 on evaluation today patient's right distal above  knee amputation ulcer actually appears to be completely healed most likely. There may still be just a slight area where there some drainage but I really feel like most of the moisture is likely coming from the [place]. With that being said his left BKA ulcer is not doing quite as well there still a lot of Slough noted at this time and again there is some Epiboly as well this would require sharp debridement today. 06/23/2019 on evaluation today patient appears to be doing well with regard to his right ulcer on the distal stump. This actually is almost completely healed although there is a very tiny area still open. Subsequently he is also doing better with regard to the left distal stump location which he has been trying to be very careful about as far as not using his prosthesis anymore than he absolutely has to. I believe this shows and he has done a great job in this regard. 07/07/2019 on evaluation today patient actually appears to be doing well with regard to his amputation site on the right which is completely healed. On the left he is apparently been using his prosthesis way too much and I think that this has actually caused the area to have a more difficult time healing. Overall I am not is pleased with this although the wound bed has a good base to it there was some slough noted this can require debridement  the biggest issue that I see in my opinion is going to be getting him to not use his prosthesis in order to allow this to heal. Readmission: 09/01/2019 patient actually presents today for readmission into the clinic concerning issues that he has been having with a fistula surgery for his left forearm. He tells me that unfortunately this did not close appropriately and somewhat dehisced they attempted to glue this back together but did not take. Nonetheless he tells me that since that time he has been having really no significant pain but he is concerned about the fact that the wound does not  seem to be closing appropriately and obviously this is a fistula in his arm for dialysis access. 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 Overall he is somewhat frustrated at this point with how things appear in regard to the current surgical incision site. Nonetheless we did contact vascular we really have not heard as to whether or not we were taking over care of the wound at this point but nonetheless he is here in the office he was referred to [place] by vascular because home health was not good to be seeing the patient and nonetheless for the time being I am going to go ahead and initiate therapy if that is not what they would like for [place] to do than they can always take over care at a later point. 09/08/2019 on evaluation today patient actually appears to be showing some signs of improvement with regard to his ulcer on the left medial forearm. He has been tolerating the dressing changes without complication. The Santyl does seem to be benefiting him at this point based on what I am seeing. Fortunately there is no signs of active infection at this time. He does still have some fairly significant and thick slough noted over the surface of the wound unfortunately. 09/22/19 on evaluation today I'm actually evaluating this patient while he is in the clinic and I am remote secondary to having to be screened for Covid-19 Virus. patient appears to be doing very well with regard to his ulcer at the surgical site. This seems to be healing quite nicely in sentencing to be doing an excellent job. Overall I'm very pleased with how things appear. His Hageman, Ellard L. ([id number]) measurements are quite a bit smaller today as well. 09/29/2019 on evaluation today patient appears to be doing well with regard to his ulcer in the left forearm region. This actually is showing signs of excellent  improvement and overall I am very pleased with the progress that he has made. Fortunately there is no signs of any significant depth which is great and overall he has a lot of new and good epithelization. No fevers, chills, nausea, vomiting, or diarrhea. Electronic Signature(s) Signed: 09/29/2019 8:56:24 AM By: Worthy Keeler PA-C Entered By: Worthy Keeler on 09/29/2019 08:56:23 [name] ([id number]) -------------------------------------------------------------------------------- Physical Exam Details Patient Name: [name], [name] Date of Service: 09/29/2019 8:30 AM Medical Record Number: [id number] Patient Account Number: [id number] Date of Birth/Sex: January 28, 1961 (58 y.o. M) Treating RN: [name] Primary Care Provider: [name] Other Clinician: Referring Provider: [name] Treating Provider/Extender: STONE III, Nehemiah Montee Weeks in Treatment: 4 Constitutional Well-nourished and well-hydrated in no acute distress. Respiratory normal breathing without difficulty. clear to auscultation bilaterally. Cardiovascular regular rate and rhythm with normal S1, S2.  Psychiatric this patient is able to make decisions and demonstrates good insight into disease process. Alert and Oriented x 3. pleasant and cooperative. Notes Patient's wound bed currently showed signs of minimal slough noted at this point and fortunately he seems to be doing quite well. With that being said I do not see any signs of active infection which is good news and I really feel like he is making great progress at this point. The patient likewise is extremely pleased with how things seem to be progressing. Electronic Signature(s) Signed: 09/29/2019 8:56:56 AM By: Worthy Keeler PA-C Entered By: Worthy Keeler on 09/29/2019 08:56:55 [name] ([id number]) -------------------------------------------------------------------------------- Physician Orders Details Patient Name: [name], [name] Date of  Service: 09/29/2019 8:30 AM Medical Record Number: [id number] Patient Account Number: [id number] Date of Birth/Sex: August 20, 1961 (58 y.o. 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 o May shower with protection. - Please do not get your wound wet Primary Wound Dressing Wound #3 Left,Medial Forearm o Santyl Ointment - to wound bed o Xeroform - over santyl Secondary Dressing Wound #3 Left,Medial Forearm o ABD pad - secured with netting Dressing Change Frequency Wound #3 Left,Medial Forearm o Change dressing every day. Follow-up Appointments Wound #3 Left,Medial Forearm o Return Appointment in 2 weeks. Electronic Signature(s) Signed: 09/29/2019 4:30:49 PM By: [name] Signed: 09/30/2019 6:11:58 PM By: Worthy Keeler PA-C Entered By: [name] on 09/29/2019 08:54:17 [name] ([id number]) [name], [name] ([id number]) -------------------------------------------------------------------------------- Problem List Details Patient Name: [name], [name] Date of Service: 09/29/2019 8:30 AM Medical Record Number: [id number] Patient Account Number: [id number] Date of Birth/Sex: [birth year] (58 y.o.  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 with fat layer 09/01/2019 No Yes exposed E11.622 Type 2 diabetes mellitus with other skin ulcer 09/01/2019 No Yes Z89.611 Acquired absence of right leg above knee 09/01/2019 No Yes Z89.512 Acquired absence of left leg below knee 09/01/2019 No Yes N18.6 End stage renal disease 09/01/2019 No Yes Z99.2 Dependence on renal dialysis 09/01/2019 No Yes I10 Essential (primary) hypertension 09/01/2019 No Yes I48.0 Paroxysmal atrial fibrillation 09/01/2019 No Yes Inactive Problems Resolved Problems [name], [name] ([id number]) Electronic Signature(s) Signed: 09/29/2019 8:34:51 AM By: Worthy Keeler PA-C Entered By: Worthy Keeler on 09/29/2019 08:34:51 Malena, [name] ([id number]) -------------------------------------------------------------------------------- Progress Note Details Patient Name: [name], Dillion L. Date of Service: 09/29/2019 8:30 AM Medical Record Number: [id number] Patient Account Number: [id number] Date of Birth/Sex: [birth year] (58 y.o. 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 He has a history of hypertension, diabetes, congestive heart failure, and states that he is having a lot of discomfort at the wound site unfortunately. There does not appear to be any signs of active infection upon initial inspection today. No fevers, chills, nausea, or vomiting noted at this time.. Silvadene Cream has been utilized at some point on this wound as well. Patient did have home health coming out but unfortunately the patient and his wife state that they were not happy with the services being rendered and therefore discontinued the service. There does not appear to be any signs of otherwise active infection at this time which is good news. The patient states he's definitely ready for this to heal and has been a symptomatic time since his surgery and he is becoming somewhat frustrated. Iodoflex also sounds to have previously been used on the wound that is when the patient's wife states the wound seem to get worse not better. As best I can tell [name] has not been utilized. 02/03/19 on evaluation today patient appears to be doing much better compared to last week's evaluation in regard to his ulcer on the right amputation site. He's been tolerating the dressing changes without complication which is good news. The Santyl does seem to be of benefit in fact the wound appears to be doing significantly better compared to my prior evaluation. Overall very pleased in this regard. No fevers, chills, nausea, or vomiting noted at this time. 02/10/19 on evaluation today patient's wound actually appears to be showing signs of excellent improvement at this time which is great news. He has been tolerating the dressing changes without  complication overall I feel like that there is no signs of active infection at this point which is also good news. I feel like that the [name] is very beneficial. Patient's wife likewise feels like things are doing quite well and is not having pain like he was in the past. 02/17/19 on evaluation today patient appears to be doing well in regard to the wound on the distal aspect of his right amputation site. He's been tolerating treatment with the Santyl at this point. I think he is nearing completion as far as treatment with cental is concerned I think were very close to getting this significantly better. Fortunately there's no evidence of infection currently. 03/03/19 on evaluation today patient actually appears to be doing quite well in regard to his his right imputation site ulceration. In fact there's a lot of new skin growth which is excellent news. I'm very pleased in this regard. Fortunately there's no signs of active infection also good news. No fevers, chills, nausea, or vomiting noted at this time. 03/17/19 on evaluation today patient's wound actually appears to be showing signs of improvement which is good news. He does have a lot of issues however with concerns about how slowly this seems to be healing as far as he and his wife are  concerned. I do believe some of the issues that were having currently are from the language barrier perspective but even that we have an interpreter that has been present for each evaluation I still think that there is some issues with their overall understanding of the treatment plan and how long wounds of this nature do take to heal. We began seeing him on April 14 and in just over a month and a half he has made improvement to where the wound is one third the size originally began. Nonetheless there still quite a bit of feeling left to do but again he has made great progress up to this point. Unfortunately he did have some issues with the silver collagen that was  ordered he received something different from the [employer]. This was Colactive. Unfortunately this actually calls some burning he seemed to have some irritation from the dressing itself that caused irritation and redness around the edge of the wound that seems to be improving but nonetheless he and his wife both [name], [name] ([id number]) believe that the Santyl might've done better. They also really want to try to utilize something that will prevent me having to degree the wound as he typically has discomfort with the debridement obviously. 03/31/19 on evaluation today patient actually appears to be doing better in regard to his lower extremity ulcer. He's been tolerating the dressing changes without complication. Fortunately there's no signs of active infection at this time. He has a lot of new skin growth in the Santyl does seem to be working appropriately for him at this time. I'm very pleased in this regard. 04/14/19 on evaluation today patient appears to be doing quite well in regard to his invitation site ulcer. This is a little smaller even than half the size of where it was when I saw him last. Fortunately there is no sign of active infection at this time which is good news. Overall he has been tolerating the dressing changes without complication still states that he has a lot of pain but again I really cannot see anything with regard to the wound that would be causing this and maybe more for neuropathy type pain. He states his weeks and up at night sometimes. 04/28/19-Patient presents at 2 weeks, with the right AKA wound, we have been using Santyl for this and there was mention of changing it to a different dressing this time around. 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 He's been tolerating the dressing changes with the Santyl unfortunately when we  switched to the collagen dressing again last time he was seen in the office when I wasn't here his wound bed deteriorated as well in the following days. His wife came in to discuss this with me and subsequently we opted to go back to the [facility] which seems to be doing excellent for him at this point. 05/26/2019 upon evaluation today patient actually appears to be doing excellent in regard to his wound on the right amputation site above the knee. In fact this appears to be almost completely healed which is great news. He has been utilizing Santyl. However he does have an issue on his left below-knee amputation stump where he has a wound as well he did not want to show me his wife wanted him to. In the end he decided he would and we did evaluate that today as well. It  is not a very deep wound but nonetheless is something that I think needs to be addressed sooner rather than later to prevent any additional issues at this point. 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 There may still be just a slight area where there some drainage but I really feel like most of the moisture is likely coming from the [place]. With that being said his left BKA ulcer is not doing quite as well there still a lot of Slough noted at this time and again there is some Epiboly as well this would require sharp debridement today. 06/23/2019 on evaluation today patient appears to be doing well with regard to his right ulcer on the distal stump. This actually is almost completely healed although there is a very tiny area still open. Subsequently he is also doing better with regard to the left distal stump location which he has been trying to be very careful about as far as not using his prosthesis anymore than he absolutely has to. I believe this shows and he has done a great job in this regard. 07/07/2019 on evaluation today patient actually  appears to be doing well with regard to his amputation site on the right which is completely healed. On the left he is apparently been using his prosthesis way too much and I think that this has actually caused the area to have a more difficult time healing. Overall I am not is pleased with this although the wound bed has a good base to it there was some slough noted this can require debridement the biggest issue that I see in my opinion is going to be getting him to not use his prosthesis in order to allow this to heal. Readmission: 09/01/2019 patient actually presents today for readmission into the clinic concerning issues that he has been having with a fistula surgery for his left forearm. He tells me that unfortunately this did not close appropriately and somewhat dehisced they attempted to glue this back together but did not take. Nonetheless he tells me that since that time he has been having really no significant pain but he is concerned about the fact that the wound does not seem to be closing appropriately and obviously this is a fistula in his arm for dialysis access. 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 Overall he is somewhat frustrated at this point with how things appear in regard to the current surgical incision site. Nonetheless we did contact vascular we really have not heard as to whether or not we were taking over care of the wound at this point but nonetheless he is here in the office he was referred to [place] by vascular because home health was not good to be seeing the patient and nonetheless for the time being I am going to go ahead and initiate therapy if that is not what they would like for [place] to do than they can always take over care at a later point. 09/08/2019 on evaluation today patient actually appears to be showing some signs of improvement with regard  to his ulcer on the left medial forearm. He has been tolerating the dressing changes without complication. The Santyl does seem to be [name], [name] ([id number]) benefiting him at this point based on what I am seeing.  Fortunately there is no signs of active infection at this time. He does still have some fairly significant and thick slough noted over the surface of the wound unfortunately. 09/22/19 on evaluation today I'm actually evaluating this patient while he is in the clinic and I am remote secondary to having to be screened for Covid-19 Virus. patient appears to be doing very well with regard to his ulcer at the surgical site. This seems to be healing quite nicely in sentencing to be doing an excellent job. Overall I'm very pleased with how things appear. His measurements are quite a bit smaller today as well. 09/29/2019 on evaluation today patient appears to be doing well with regard to his ulcer in the left forearm region. This actually is showing signs of excellent improvement and overall I am very pleased with the progress that he has made. Fortunately there is no signs of any significant depth which is great and overall he has a lot of new and good epithelization. No fevers, chills, nausea, vomiting, or diarrhea. Patient History Information obtained from Patient. Family History Cancer - Father, Diabetes - Siblings, No family history of Hereditary Spherocytosis. Social History Former smoker - ended on 10/16/1994, Marital Status - Married, Alcohol Use - Never, Drug Use - No History, Caffeine Use - Rarely. Medical History Cardiovascular Patient has history of Arrhythmia - A-fib, Congestive Heart Failure, Hypertension, Peripheral Arterial Disease - Bilateral LE amputations Endocrine Patient has history of Type II Diabetes Genitourinary Patient has history of End Stage Renal Disease - Dialysis M,W,F Hospitalization/Surgery History - Right AKA. - Left BKA. 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 Respiratory Denies complaints or symptoms of Chronic or frequent coughs, Shortness of Breath. Cardiovascular Denies complaints or symptoms of Chest pain, LE edema. Psychiatric Denies complaints or symptoms of Anxiety, Claustrophobia. Objective Demonte, Baruc L. ([id number]) Constitutional Well-nourished and well-hydrated in no acute distress. Vitals Time Taken: 8:26 AM, Height: 67 in, Weight: 212 lbs, BMI: 33.2, Temperature: 97.9 F, Pulse: 70 bpm, Respiratory Rate: 16 breaths/min, Blood Pressure: 103/50 mmHg. Respiratory normal breathing without difficulty. clear to auscultation bilaterally. Cardiovascular regular rate and rhythm with normal S1, S2. Psychiatric this patient is able to make decisions and demonstrates good insight into disease process. Alert and Oriented x 3. pleasant and cooperative. General Notes: Patient's wound bed currently showed signs of minimal slough noted at this point and fortunately he seems to be doing quite well. With that being said I do not see any signs of active infection which is good news and I really feel like he is making great progress at this point. The patient likewise is extremely pleased with how things seem to be progressing. Integumentary (Hair, Skin) Wound #3 status is Open. Original cause of wound was Surgical Injury. The wound is located on the Left,Medial Forearm. The wound measures 0.5cm length x 2cm width x 0.1cm depth; 0.785cm^2 area and 0.079cm^3 volume. There is Fat Layer (Subcutaneous Tissue) Exposed exposed. There is no undermining noted. There is a medium amount of serous drainage noted. The wound margin is flat and intact. There is small (1-33%) granulation within the  wound bed. There is a large (67- 100%) amount of necrotic tissue within the wound bed including Eschar and Adherent Slough. 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 There was a Chemical/Enzymatic/Mechanical debridement performed by [name], RN. With the following instrument(s): tongue [name], Andris L. ([id number]) blade after achieving pain control using Lidocaine 4% Topical Solution. Agent used was [employer]. A time out was conducted at 08:53, prior to the start of the procedure. There was no bleeding. The procedure was tolerated well with a pain level of 0 throughout and a pain level of 0 following the procedure. Post Debridement Measurements: 0.5cm length x 2cm width x 0.1cm depth; 0.079cm^3 volume. Character of Wound/Ulcer Post Debridement is improved. Post procedure Diagnosis Wound #3: Same as Pre-Procedure Plan Wound Cleansing: Wound #3 Left,Medial Forearm: Clean wound with Normal Saline. May shower with protection. - Please do not get your wound wet Primary Wound Dressing: Wound #3 Left,Medial Forearm: Santyl Ointment - to wound bed Xeroform - over santyl Secondary Dressing: Wound #3 Left,Medial Forearm: ABD pad - secured with netting Dressing Change Frequency: Wound #3 Left,Medial Forearm: Change dressing every day. Follow-up Appointments: Wound #3 Left,Medial Forearm: Return Appointment in 2 weeks. 1. The patient has a follow-up appointment with Dr. Lucky Cowboy  on the 29th of this month which will be his follow-up there. Hopefully by that time he will be healed if not healed I think it will be very close. 2. With regard to the dressings I would recommend that we continue with the Santyl ointment followed by Xeroform over top of the Santyl which seems to be doing excellent for him and keeping things moist and allow this to heal white nicely. 3. I am also going to recommend that we continue to limit the motion as far as been in the arm I think that is obviously help this as well. We will see patient back for reevaluation in 2 weeks here in the clinic. If anything worsens or changes patient will contact our office for additional recommendations. Electronic Signature(s) Signed: 09/29/2019 8:57:43 AM By: Worthy Keeler PA-C Entered By: Worthy Keeler on 09/29/2019 08:57:43 [name] ([id number]) -------------------------------------------------------------------------------- ROS/PFSH Details Patient Name: [name], Mitesh L. Date of Service: 09/29/2019 8:30 AM Medical Record Number: [id number] Patient Account Number: [id number] Date of Birth/Sex: [birth year] (58 y.o. 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 Respiratory Complaints and Symptoms: Negative for: Chronic or frequent coughs; Shortness of Breath Cardiovascular Complaints and Symptoms: Negative for: Chest pain; LE edema Medical History: Positive for: Arrhythmia - A-fib; Congestive Heart Failure; Hypertension; Peripheral Arterial Disease - Bilateral  LE amputations Psychiatric Complaints and Symptoms: Negative for: Anxiety; Claustrophobia Endocrine Medical History: Positive for: Type II Diabetes Time with diabetes: 14 years Treated with: Oral agents Blood sugar tested every day: No Blood sugar testing results: Breakfast: 128; Lunch: 145 Genitourinary Medical History: Positive for: End Stage Renal Disease - Dialysis M,W,F Immunizations Guderian, Verdie L. ([id number]) Pneumococcal Vaccine:  Received Pneumococcal Vaccination: Yes Implantable Devices None Hospitalization / Surgery History Type of Hospitalization/Surgery Right AKA Left BKA Family and Social History Cancer: Yes - Father; Diabetes: Yes - Siblings; Hereditary Spherocytosis: No; Former smoker - ended on 10/16/1994; Marital Status - Married; Alcohol Use: Never; Drug Use: No History; Caffeine Use: Rarely Physician Affirmation I have reviewed and agree with the above information. Electronic Signature(s) Signed: 09/29/2019 4:30:49 PM By: [name] Signed: 09/30/2019 6:11:58 PM By: Worthy Keeler PA-C Entered By: Worthy Keeler on 09/29/2019 08:56:40 [name] ([id number]) -------------------------------------------------------------------------------- SuperBill Details Patient Name: [name], Ashtyn L. Date of Service: 09/29/2019 Medical Record Number: [id number] Patient Account Number: [id number] Date of Birth/Sex: April 12, 1961 (58 y.o. M) Treating RN: [name] Primary Care Provider: [name] Other Clinician: Referring Provider: [name] Treating Provider/Extender: [name], Tarryn Bogdan Weeks in Treatment: 4 Diagnosis Coding ICD-10 Codes Code Description T81.31XA Disruption of external operation (surgical) wound, not elsewhere classified, initial encounter L98.492 Non-pressure chronic ulcer of skin of other sites with fat layer exposed E11.622 Type 2 diabetes mellitus with other skin ulcer Z89.611 Acquired absence of right leg above knee Z89.512  Acquired absence of left leg below knee N18.6 End stage renal disease Z99.2 Dependence on renal dialysis I10 Essential (primary) hypertension I48.0 Paroxysmal atrial fibrillation Facility Procedures CPT4 Code: [id number] Description: [id number] - [place]BR[place] W/O ANES NON SELECT Modifier: Quantity: 1 Physician Procedures CPT4: Description Modifier Quantity Code [id number] - WC PHYS LEVEL 4 - EST PT 1 ICD-10 Diagnosis Description T81.31XA Disruption of external operation (surgical) wound, not elsewhere classified, initial encounter L98.492 Non-pressure chronic ulcer of  skin of other sites with fat layer exposed E11.622 Type 2 diabetes mellitus with other skin ulcer Z89.611 Acquired absence of right leg above knee Electronic Signature(s) Signed: 09/29/2019 8:57:56 AM By: Worthy Keeler PA-C Entered By: Worthy Keeler on 09/29/2019 08:57:55

## 2019-10-07 ENCOUNTER — Encounter: Payer: Self-pay | Admitting: Emergency Medicine

## 2019-10-07 ENCOUNTER — Inpatient Hospital Stay
Admission: EM | Admit: 2019-10-07 | Discharge: 2019-10-16 | DRG: 871 | Disposition: E | Payer: Medicare Other | Attending: Internal Medicine | Admitting: Internal Medicine

## 2019-10-07 ENCOUNTER — Emergency Department: Payer: Medicare Other

## 2019-10-07 ENCOUNTER — Other Ambulatory Visit: Payer: Self-pay

## 2019-10-07 DIAGNOSIS — R748 Abnormal levels of other serum enzymes: Secondary | ICD-10-CM | POA: Diagnosis present

## 2019-10-07 DIAGNOSIS — I2510 Atherosclerotic heart disease of native coronary artery without angina pectoris: Secondary | ICD-10-CM | POA: Diagnosis present

## 2019-10-07 DIAGNOSIS — R0989 Other specified symptoms and signs involving the circulatory and respiratory systems: Secondary | ICD-10-CM | POA: Diagnosis present

## 2019-10-07 DIAGNOSIS — E1151 Type 2 diabetes mellitus with diabetic peripheral angiopathy without gangrene: Secondary | ICD-10-CM | POA: Diagnosis present

## 2019-10-07 DIAGNOSIS — L0390 Cellulitis, unspecified: Secondary | ICD-10-CM | POA: Diagnosis present

## 2019-10-07 DIAGNOSIS — L03317 Cellulitis of buttock: Secondary | ICD-10-CM

## 2019-10-07 DIAGNOSIS — Z89611 Acquired absence of right leg above knee: Secondary | ICD-10-CM

## 2019-10-07 DIAGNOSIS — A4189 Other specified sepsis: Principal | ICD-10-CM | POA: Diagnosis present

## 2019-10-07 DIAGNOSIS — Z8042 Family history of malignant neoplasm of prostate: Secondary | ICD-10-CM | POA: Diagnosis not present

## 2019-10-07 DIAGNOSIS — R4020 Unspecified coma: Secondary | ICD-10-CM | POA: Diagnosis present

## 2019-10-07 DIAGNOSIS — E7800 Pure hypercholesterolemia, unspecified: Secondary | ICD-10-CM | POA: Diagnosis present

## 2019-10-07 DIAGNOSIS — Z7401 Bed confinement status: Secondary | ICD-10-CM

## 2019-10-07 DIAGNOSIS — I252 Old myocardial infarction: Secondary | ICD-10-CM | POA: Diagnosis not present

## 2019-10-07 DIAGNOSIS — N186 End stage renal disease: Secondary | ICD-10-CM | POA: Diagnosis present

## 2019-10-07 DIAGNOSIS — Z992 Dependence on renal dialysis: Secondary | ICD-10-CM

## 2019-10-07 DIAGNOSIS — E1122 Type 2 diabetes mellitus with diabetic chronic kidney disease: Secondary | ICD-10-CM | POA: Diagnosis present

## 2019-10-07 DIAGNOSIS — I132 Hypertensive heart and chronic kidney disease with heart failure and with stage 5 chronic kidney disease, or end stage renal disease: Secondary | ICD-10-CM | POA: Diagnosis present

## 2019-10-07 DIAGNOSIS — E785 Hyperlipidemia, unspecified: Secondary | ICD-10-CM | POA: Diagnosis present

## 2019-10-07 DIAGNOSIS — R6521 Severe sepsis with septic shock: Secondary | ICD-10-CM | POA: Diagnosis present

## 2019-10-07 DIAGNOSIS — Z833 Family history of diabetes mellitus: Secondary | ICD-10-CM

## 2019-10-07 DIAGNOSIS — R404 Transient alteration of awareness: Secondary | ICD-10-CM | POA: Diagnosis not present

## 2019-10-07 DIAGNOSIS — I9589 Other hypotension: Secondary | ICD-10-CM | POA: Diagnosis present

## 2019-10-07 DIAGNOSIS — Z89512 Acquired absence of left leg below knee: Secondary | ICD-10-CM | POA: Diagnosis not present

## 2019-10-07 DIAGNOSIS — I700 Atherosclerosis of aorta: Secondary | ICD-10-CM | POA: Diagnosis present

## 2019-10-07 DIAGNOSIS — Z7984 Long term (current) use of oral hypoglycemic drugs: Secondary | ICD-10-CM

## 2019-10-07 DIAGNOSIS — U071 COVID-19: Secondary | ICD-10-CM | POA: Diagnosis present

## 2019-10-07 DIAGNOSIS — N2581 Secondary hyperparathyroidism of renal origin: Secondary | ICD-10-CM | POA: Diagnosis present

## 2019-10-07 DIAGNOSIS — I472 Ventricular tachycardia: Secondary | ICD-10-CM | POA: Diagnosis present

## 2019-10-07 DIAGNOSIS — I480 Paroxysmal atrial fibrillation: Secondary | ICD-10-CM | POA: Diagnosis present

## 2019-10-07 DIAGNOSIS — D631 Anemia in chronic kidney disease: Secondary | ICD-10-CM | POA: Diagnosis present

## 2019-10-07 DIAGNOSIS — R6510 Systemic inflammatory response syndrome (SIRS) of non-infectious origin without acute organ dysfunction: Secondary | ICD-10-CM

## 2019-10-07 DIAGNOSIS — Z8673 Personal history of transient ischemic attack (TIA), and cerebral infarction without residual deficits: Secondary | ICD-10-CM

## 2019-10-07 DIAGNOSIS — I469 Cardiac arrest, cause unspecified: Secondary | ICD-10-CM | POA: Diagnosis not present

## 2019-10-07 DIAGNOSIS — I5032 Chronic diastolic (congestive) heart failure: Secondary | ICD-10-CM | POA: Diagnosis present

## 2019-10-07 DIAGNOSIS — K219 Gastro-esophageal reflux disease without esophagitis: Secondary | ICD-10-CM | POA: Diagnosis present

## 2019-10-07 DIAGNOSIS — Z7982 Long term (current) use of aspirin: Secondary | ICD-10-CM

## 2019-10-07 DIAGNOSIS — Z79899 Other long term (current) drug therapy: Secondary | ICD-10-CM

## 2019-10-07 LAB — CBC
HCT: 35.2 % — ABNORMAL LOW (ref 39.0–52.0)
Hemoglobin: 11.3 g/dL — ABNORMAL LOW (ref 13.0–17.0)
MCH: 28.4 pg (ref 26.0–34.0)
MCHC: 32.1 g/dL (ref 30.0–36.0)
MCV: 88.4 fL (ref 80.0–100.0)
Platelets: 222 10*3/uL (ref 150–400)
RBC: 3.98 MIL/uL — ABNORMAL LOW (ref 4.22–5.81)
RDW: 17 % — ABNORMAL HIGH (ref 11.5–15.5)
WBC: 39.4 10*3/uL — ABNORMAL HIGH (ref 4.0–10.5)
nRBC: 0.1 % (ref 0.0–0.2)

## 2019-10-07 LAB — CBC WITH DIFFERENTIAL/PLATELET
Abs Immature Granulocytes: 0.89 10*3/uL — ABNORMAL HIGH (ref 0.00–0.07)
Basophils Absolute: 0.1 10*3/uL (ref 0.0–0.1)
Basophils Relative: 0 %
Eosinophils Absolute: 0.1 10*3/uL (ref 0.0–0.5)
Eosinophils Relative: 1 %
HCT: 42 % (ref 39.0–52.0)
Hemoglobin: 13.1 g/dL (ref 13.0–17.0)
Immature Granulocytes: 3 %
Lymphocytes Relative: 2 %
Lymphs Abs: 0.6 10*3/uL — ABNORMAL LOW (ref 0.7–4.0)
MCH: 28.5 pg (ref 26.0–34.0)
MCHC: 31.2 g/dL (ref 30.0–36.0)
MCV: 91.3 fL (ref 80.0–100.0)
Monocytes Absolute: 1.3 10*3/uL — ABNORMAL HIGH (ref 0.1–1.0)
Monocytes Relative: 5 %
Neutro Abs: 24.6 10*3/uL — ABNORMAL HIGH (ref 1.7–7.7)
Neutrophils Relative %: 89 %
Platelets: 274 10*3/uL (ref 150–400)
RBC: 4.6 MIL/uL (ref 4.22–5.81)
RDW: 17.2 % — ABNORMAL HIGH (ref 11.5–15.5)
WBC: 27.5 10*3/uL — ABNORMAL HIGH (ref 4.0–10.5)
nRBC: 0.2 % (ref 0.0–0.2)

## 2019-10-07 LAB — FERRITIN: Ferritin: 588 ng/mL — ABNORMAL HIGH (ref 24–336)

## 2019-10-07 LAB — GLUCOSE, CAPILLARY
Glucose-Capillary: 308 mg/dL — ABNORMAL HIGH (ref 70–99)
Glucose-Capillary: 282 mg/dL — ABNORMAL HIGH (ref 70–99)
Glucose-Capillary: 263 mg/dL — ABNORMAL HIGH (ref 70–99)

## 2019-10-07 LAB — RESPIRATORY PANEL BY RT PCR (FLU A&B, COVID)
Influenza A by PCR: NEGATIVE
Influenza B by PCR: NEGATIVE
SARS Coronavirus 2 by RT PCR: POSITIVE — AB

## 2019-10-07 LAB — COMPREHENSIVE METABOLIC PANEL
ALT: 9 U/L (ref 0–44)
AST: 18 U/L (ref 15–41)
Albumin: 3.4 g/dL — ABNORMAL LOW (ref 3.5–5.0)
Alkaline Phosphatase: 213 U/L — ABNORMAL HIGH (ref 38–126)
Anion gap: 24 — ABNORMAL HIGH (ref 5–15)
BUN: 67 mg/dL — ABNORMAL HIGH (ref 6–20)
CO2: 19 mmol/L — ABNORMAL LOW (ref 22–32)
Calcium: 9.7 mg/dL (ref 8.9–10.3)
Chloride: 90 mmol/L — ABNORMAL LOW (ref 98–111)
Creatinine, Ser: 8 mg/dL — ABNORMAL HIGH (ref 0.61–1.24)
GFR calc Af Amer: 8 mL/min — ABNORMAL LOW (ref >60)
GFR calc non Af Amer: 7 mL/min — ABNORMAL LOW (ref >60)
Glucose, Bld: 317 mg/dL — ABNORMAL HIGH (ref 70–99)
Potassium: 4.7 mmol/L (ref 3.5–5.1)
Sodium: 133 mmol/L — ABNORMAL LOW (ref 135–145)
Total Bilirubin: 0.7 mg/dL (ref 0.3–1.2)
Total Protein: 7.5 g/dL (ref 6.5–8.1)

## 2019-10-07 LAB — PROTIME-INR
INR: 1.3 — ABNORMAL HIGH (ref 0.8–1.2)
Prothrombin Time: 15.7 seconds — ABNORMAL HIGH (ref 11.4–15.2)

## 2019-10-07 LAB — ABO/RH: ABO/RH(D): O POS

## 2019-10-07 LAB — HEMOGLOBIN A1C
Hgb A1c MFr Bld: 7 % — ABNORMAL HIGH (ref 4.8–5.6)
Mean Plasma Glucose: 154.2 mg/dL

## 2019-10-07 LAB — LACTIC ACID, PLASMA
Lactic Acid, Venous: 5.4 mmol/L (ref 0.5–1.9)
Lactic Acid, Venous: 2.3 mmol/L (ref 0.5–1.9)

## 2019-10-07 LAB — LACTATE DEHYDROGENASE: LDH: 116 U/L (ref 98–192)

## 2019-10-07 LAB — C-REACTIVE PROTEIN: CRP: 40.7 mg/dL — ABNORMAL HIGH (ref <1.0)

## 2019-10-07 LAB — PROCALCITONIN: Procalcitonin: 49.01 ng/mL

## 2019-10-07 LAB — CREATININE, SERUM
Creatinine, Ser: 8.84 mg/dL — ABNORMAL HIGH (ref 0.61–1.24)
GFR calc Af Amer: 7 mL/min — ABNORMAL LOW (ref >60)
GFR calc non Af Amer: 6 mL/min — ABNORMAL LOW (ref >60)

## 2019-10-07 LAB — HIV ANTIBODY (ROUTINE TESTING W REFLEX): HIV Screen 4th Generation wRfx: NONREACTIVE

## 2019-10-07 LAB — FIBRIN DERIVATIVES D-DIMER (ARMC ONLY): Fibrin derivatives D-dimer (ARMC): 1479.84 ng/mL (FEU) — ABNORMAL HIGH (ref 0.00–499.00)

## 2019-10-07 LAB — FIBRINOGEN: Fibrinogen: >750 mg/dL — ABNORMAL HIGH (ref 210–475)

## 2019-10-07 MED ORDER — AMIODARONE HCL 200 MG PO TABS
200.0000 mg | ORAL_TABLET | Freq: Every day | ORAL | Status: DC
  Filled 2019-10-07: qty 1

## 2019-10-07 MED ORDER — MIDODRINE HCL 5 MG PO TABS
10.0000 mg | ORAL_TABLET | Freq: Once | ORAL | Status: AC
  Administered 2019-10-07: 10 mg via ORAL

## 2019-10-07 MED ORDER — ZINC SULFATE 220 (50 ZN) MG PO CAPS: 220.0000 mg | ORAL_CAPSULE | Freq: Every day | ORAL | Status: DC

## 2019-10-07 MED ORDER — POLYETHYLENE GLYCOL 3350 17 G PO PACK: 17.0000 g | PACK | Freq: Every day | ORAL | Status: DC | PRN

## 2019-10-07 MED ORDER — INSULIN ASPART 100 UNIT/ML ~~LOC~~ SOLN
0.0000 [IU] | Freq: Three times a day (TID) | SUBCUTANEOUS | Status: DC
  Administered 2019-10-07: 3 [IU] via SUBCUTANEOUS
  Filled 2019-10-07: qty 1

## 2019-10-07 MED ORDER — EPINEPHRINE 1 MG/10ML IJ SOSY
PREFILLED_SYRINGE | INTRAMUSCULAR | Status: AC
  Filled 2019-10-07: qty 20

## 2019-10-07 MED ORDER — ASPIRIN EC 81 MG PO TBEC: 81.0000 mg | DELAYED_RELEASE_TABLET | Freq: Every day | ORAL | Status: DC

## 2019-10-07 MED ORDER — ONDANSETRON HCL 4 MG PO TABS: 4.0000 mg | ORAL_TABLET | Freq: Four times a day (QID) | ORAL | Status: DC | PRN

## 2019-10-07 MED ORDER — CHLORHEXIDINE GLUCONATE CLOTH 2 % EX PADS: 6.0000 | MEDICATED_PAD | Freq: Every day | CUTANEOUS | Status: DC

## 2019-10-07 MED ORDER — MIDODRINE HCL 5 MG PO TABS: 10.0000 mg | ORAL_TABLET | Freq: Once | ORAL | Status: DC

## 2019-10-07 MED ORDER — DEXAMETHASONE SODIUM PHOSPHATE 10 MG/ML IJ SOLN
6.0000 mg | INTRAMUSCULAR | Status: DC
  Filled 2019-10-07: qty 0.6

## 2019-10-07 MED ORDER — HYDROCOD POLST-CPM POLST ER 10-8 MG/5ML PO SUER: 5.0000 mL | Freq: Two times a day (BID) | ORAL | Status: DC | PRN

## 2019-10-07 MED ORDER — SODIUM BICARBONATE 8.4 % IV SOLN
INTRAVENOUS | Status: AC
  Filled 2019-10-07: qty 50

## 2019-10-07 MED ORDER — SODIUM CHLORIDE 0.9 % IV SOLN
100.0000 mg | Freq: Every day | INTRAVENOUS | Status: DC
  Filled 2019-10-07: qty 20

## 2019-10-07 MED ORDER — PANTOPRAZOLE SODIUM 40 MG PO TBEC: 40.0000 mg | DELAYED_RELEASE_TABLET | Freq: Every day | ORAL | Status: DC

## 2019-10-07 MED ORDER — SODIUM CHLORIDE 0.9 % IV SOLN
200.0000 mg | Freq: Once | INTRAVENOUS | Status: DC
  Filled 2019-10-07: qty 40

## 2019-10-07 MED ORDER — PIPERACILLIN-TAZOBACTAM 3.375 G IVPB 30 MIN
3.3750 g | Freq: Once | INTRAVENOUS | Status: AC
  Administered 2019-10-07: 3.375 g via INTRAVENOUS
  Filled 2019-10-07: qty 50

## 2019-10-07 MED ORDER — ACETAMINOPHEN 325 MG PO TABS: 650.0000 mg | ORAL_TABLET | Freq: Four times a day (QID) | ORAL | Status: DC | PRN

## 2019-10-07 MED ORDER — ASCORBIC ACID 500 MG PO TABS: 500.0000 mg | ORAL_TABLET | Freq: Every day | ORAL | Status: DC

## 2019-10-07 MED ORDER — VANCOMYCIN HCL IN DEXTROSE 1-5 GM/200ML-% IV SOLN
1000.0000 mg | Freq: Once | INTRAVENOUS | Status: AC
  Administered 2019-10-07: 1000 mg via INTRAVENOUS
  Filled 2019-10-07: qty 200

## 2019-10-07 MED ORDER — SODIUM CHLORIDE 0.9% FLUSH: 3.0000 mL | Freq: Two times a day (BID) | INTRAVENOUS | Status: DC

## 2019-10-07 MED ORDER — HEPARIN SODIUM (PORCINE) 5000 UNIT/ML IJ SOLN: 5000.0000 [IU] | Freq: Three times a day (TID) | INTRAMUSCULAR | Status: DC

## 2019-10-07 MED ORDER — SODIUM CHLORIDE 0.9 % IV BOLUS
1000.0000 mL | Freq: Once | INTRAVENOUS | Status: AC
  Administered 2019-10-07: 1000 mL via INTRAVENOUS

## 2019-10-07 MED ORDER — GUAIFENESIN-DM 100-10 MG/5ML PO SYRP
10.0000 mL | ORAL_SOLUTION | ORAL | Status: DC | PRN
  Filled 2019-10-07: qty 10

## 2019-10-07 MED ORDER — MIDODRINE HCL 5 MG PO TABS
10.0000 mg | ORAL_TABLET | Freq: Three times a day (TID) | ORAL | Status: DC
  Administered 2019-10-07 (×2): 10 mg via ORAL
  Filled 2019-10-07 (×4): qty 2

## 2019-10-07 MED ORDER — SEVELAMER CARBONATE 800 MG PO TABS
2400.0000 mg | ORAL_TABLET | Freq: Three times a day (TID) | ORAL | Status: DC
  Administered 2019-10-07: 2400 mg via ORAL
  Filled 2019-10-07 (×3): qty 3

## 2019-10-07 MED ORDER — ONDANSETRON HCL 4 MG/2ML IJ SOLN: 4.0000 mg | Freq: Four times a day (QID) | INTRAMUSCULAR | Status: DC | PRN

## 2019-10-07 MED ORDER — SODIUM CHLORIDE 0.9 % IV SOLN
1.0000 g | INTRAVENOUS | Status: DC
  Filled 2019-10-07: qty 10

## 2019-10-07 MED ORDER — INSULIN ASPART 100 UNIT/ML ~~LOC~~ SOLN: 0.0000 [IU] | Freq: Every day | SUBCUTANEOUS | Status: DC

## 2019-10-07 MED ORDER — CINACALCET HCL 30 MG PO TABS
60.0000 mg | ORAL_TABLET | Freq: Every day | ORAL | Status: DC
  Filled 2019-10-07 (×2): qty 2

## 2019-10-08 LAB — BLOOD CULTURE ID PANEL (REFLEXED)

## 2019-10-10 LAB — CULTURE, BLOOD (ROUTINE X 2)
Special Requests: ADEQUATE
Special Requests: ADEQUATE

## 2019-10-13 ENCOUNTER — Ambulatory Visit (INDEPENDENT_AMBULATORY_CARE_PROVIDER_SITE_OTHER): Payer: Medicare Other | Admitting: Vascular Surgery

## 2019-10-16 NOTE — Progress Notes (Signed)
[facility] called on patient. Upon arrival, patient was receiving chest compressions. RT noted cyanosis and began bagging patient with 100% O2. Patient was intubated by ED physician with 7.5 ETT. Positive for condensation in tube. Breath sounds equal. CPR and ACLS measures provided. No pulse. Patient was pronounced by ED physician.

## 2019-10-16 NOTE — Progress Notes (Addendum)
   2019/10/31 2020  Clinical Encounter Type  Visited With Patient not available;Health care provider  Visit Type Code  Referral From Nurse   Chaplain received a [facility] page for this patient. Upon arrival, the medical team was assessing the patient and administering care. Chaplain made [facility] aware of simultaneous CPR in-progress in the ED. Chaplain asked staff to page if needs arise.

## 2019-10-16 NOTE — ED Notes (Signed)
Spoke with pt's wife and provided update. Provided pt with phone to speak with wife.

## 2019-10-16 NOTE — ED Provider Notes (Signed)
[facility]  Department of Emergency Medicine   Code Blue CONSULT NOTE  Chief Complaint: Cardiac arrest/unresponsive   Level V Caveat: Unresponsive  History of present illness: I was contacted by the hospital for a CODE BLUE cardiac arrest upstairs and presented to the patient's bedside.  Patient known to be Covid positive, had just started a dialysis treatment in the room when he became unresponsive.  CPR in progress on my arrival to the bedside .  ROS: Unable to obtain, Level V caveat  Scheduled Meds: . amiodarone  200 mg Oral Daily  . vitamin C  500 mg Oral Daily  . aspirin EC  81 mg Oral Daily  . [START ON 09/18/2019] Chlorhexidine Gluconate Cloth  6 each Topical Q0600  . cinacalcet  60 mg Oral Q breakfast  . dexamethasone (DECADRON) injection  6 mg Intravenous Q24H  . EPINEPHrine      . heparin  5,000 Units Subcutaneous Q8H  . insulin aspart  0-5 Units Subcutaneous QHS  . insulin aspart  0-6 Units Subcutaneous TID WC  . midodrine  10 mg Oral TID WC  . midodrine  10 mg Oral Once  . pantoprazole  40 mg Oral Daily  . sevelamer carbonate  2,400 mg Oral TID WC  . sodium bicarbonate      . sodium chloride flush  3 mL Intravenous Q12H  . zinc sulfate  220 mg Oral Daily   Continuous Infusions: . cefTRIAXone (ROCEPHIN)  IV Stopped (2019/10/21 1457)  . remdesivir 200 mg in sodium chloride 0.9% 250 mL IVPB Stopped (2019/10/21 1500)   Followed by  . [START ON 10/13/2019] remdesivir 100 mg in NS 100 mL     PRN Meds:.acetaminophen, chlorpheniramine-HYDROcodone, guaiFENesin-dextromethorphan, ondansetron **OR** ondansetron (ZOFRAN) IV, polyethylene glycol Past Medical History:  Diagnosis Date  . (HFpEF) heart failure with preserved ejection fraction ([place])    a. 2017 EF per LHC 30-35%; b. 09/2018 Echo: EF 55-60%, no rwma, Gr2 DD, mild MR, mildly dil LA. PASP 7mmHg. small to mod circumferential pericardial eff, no tamponade.  . Anemia of chronic disease   .  Atherosclerosis   . CAD (coronary artery disease)    a. 2017 LHC, severe 1v disease, occluded pRCA, L-R collaterals, moderate LV dysfunction EF 30-35.  . Diabetes mellitus with complication ([place])   . End stage renal disease on dialysis ([place])    Labile BP  . GERD (gastroesophageal reflux disease)   . History of ventricular tachycardia    a. 2017 polymorphic VT and cardiac arrest.  . Hypercholesteremia   . Hyperlipidemia LDL goal <70   . Hypertension   . Labile blood pressure    on HD  . Myocardial infarction ([place])    heart stopped 2019  . NSVT (nonsustained ventricular tachycardia) ([place])    08/2018 NSVT  . PAF (paroxysmal atrial fibrillation) ([place])    a. 08/2018 CHA2DS2VASc at least 6-->initially placed on eliquis but d/c'd 2/2 anemia. On Amio.  Marland Kitchen Peripheral arterial occlusive disease (HCC)    s/p L BKA, R AKA  . PONV (postoperative nausea and vomiting)   . S/P AKA (above knee amputation) unilateral, right ([place])    08/2018  . S/P BKA (below knee amputation) unilateral, left ([place])    2015  . Stroke [facility])    Past Surgical History:  Procedure Laterality Date  . A/V FISTULAGRAM Left 01/09/2017   Procedure: A/V Fistulagram;  Surgeon: [name], MD;  Location: [facility];  Service: Cardiovascular;  Laterality: Left;  .  A/V FISTULAGRAM Left 06/06/2017   Procedure: A/V Fistulagram;  Surgeon: [name], MD;  Location: [facility] [place] [facility];  Service: Cardiovascular;  Laterality: Left;  . A/V FISTULAGRAM Left 08/15/2017   Procedure: A/V Fistulagram;  Surgeon: [name], MD;  Location: [place] [facility];  Service: Cardiovascular;  Laterality: Left;  . A/V FISTULAGRAM Left 01/22/2018   Procedure: A/V FISTULAGRAM;  Surgeon: [name], MD;  Location: [place] [facility];  Service: Cardiovascular;  Laterality: Left;  . A/V FISTULAGRAM Left 07/16/2019   Procedure: A/V FISTULAGRAM;  Surgeon: [name], MD;  Location: [place] [facility];  Service: Cardiovascular;   Laterality: Left;  . A/V SHUNT INTERVENTION N/A 06/06/2017   Procedure: A/V SHUNT INTERVENTION;  Surgeon: [name], MD;  Location: [place] [facility];  Service: Cardiovascular;  Laterality: N/A;  . AMPUTATION Right 04/25/2016   Procedure: TOE AMPUTATION;  Surgeon: Katha Cabal, MD;  Location: [facility];  Service: Vascular;  Laterality: Right;  . AMPUTATION Right 09/09/2018   Procedure: AMPUTATION ABOVE KNEE;  Surgeon: [name], MD;  Location: [facility];  Service: Vascular;  Laterality: Right;  . APPLICATION OF WOUND VAC Right 04/25/2016   Procedure: APPLICATION OF WOUND VAC;  Surgeon: Katha Cabal, MD;  Location: [facility];  Service: Vascular;  Laterality: Right;  . APPLICATION OF WOUND VAC Right 12/18/2018   Procedure: APPLICATION OF WOUND VAC;  Surgeon: [name], MD;  Location: [facility];  Service: Vascular;  Laterality: Right;  wound vac  . AV FISTULA PLACEMENT Left 2015  . AV FISTULA PLACEMENT Left 07/30/2019   Procedure: ARTERIOVENOUS (AV) FISTULA CREATION (BRACHIAL CEPHALIC );  Surgeon: [name], MD;  Location: [facility];  Service: Vascular;  Laterality: Left;  . CARDIAC CATHETERIZATION N/A 04/30/2016   Procedure: Left Heart Cath and Coronary Angiography;  Surgeon: Wellington Hampshire, MD;  Location: [place] [facility];  Service: Cardiovascular;  Laterality: N/A;  . CATARACT EXTRACTION, BILATERAL    . LEG AMPUTATION BELOW KNEE Left   . LOWER EXTREMITY ANGIOGRAPHY Right 07/24/2018   Procedure: LOWER EXTREMITY ANGIOGRAPHY;  Surgeon: [name], MD;  Location: [place] [facility];  Service: Cardiovascular;  Laterality: Right;  . LOWER EXTREMITY ANGIOGRAPHY Right 08/13/2018   Procedure: LOWER EXTREMITY ANGIOGRAPHY;  Surgeon: [name], MD;  Location: [place] [facility];  Service: Cardiovascular;  Laterality: Right;  . LOWER EXTREMITY ANGIOGRAPHY Right 09/04/2018   Procedure: Lower Extremity Angiography;  Surgeon: [name], MD;  Location: [place] [facility];   Service: Cardiovascular;  Laterality: Right;  . PERIPHERAL VASCULAR CATHETERIZATION N/A 10/03/2015   Procedure: A/V Shuntogram/Fistulagram;  Surgeon: [name], MD;  Location: [place] [facility];  Service: Cardiovascular;  Laterality: N/A;  . PERIPHERAL VASCULAR CATHETERIZATION N/A 01/16/2016   Procedure: Abdominal Aortogram w/Lower Extremity;  Surgeon: [name], MD;  Location: [place] [facility];  Service: Cardiovascular;  Laterality: N/A;  . PERIPHERAL VASCULAR CATHETERIZATION  01/16/2016   Procedure: Lower Extremity Intervention;  Surgeon: [name], MD;  Location: [place] [facility];  Service: Cardiovascular;;  . PERIPHERAL VASCULAR CATHETERIZATION Right 04/27/2016   Procedure: Lower Extremity Angiography;  Surgeon: Katha Cabal, MD;  Location: [place] [facility];  Service: Cardiovascular;  Laterality: Right;  . PERIPHERAL VASCULAR CATHETERIZATION  04/27/2016   Procedure: Lower Extremity Intervention;  Surgeon: Katha Cabal, MD;  Location: [place] [facility];  Service: Cardiovascular;;  . PERIPHERAL VASCULAR THROMBECTOMY  Left 07/22/2019   Procedure: DIAYLSIS CATHETER INSERTION;  Surgeon: Katha Cabal, MD;  Location: [place] [facility];  Service: Cardiovascular;  Laterality: Left;  . TRANSMETATARSAL AMPUTATION Right 04/28/2016   Procedure: TRANSMETATARSAL AMPUTATION;  Surgeon: Albertine Patricia, DPM;  Location: [facility];  Service: Podiatry;  Laterality: Right;  . WOUND DEBRIDEMENT Right 04/25/2016   Procedure: DEBRIDEMENT WOUND;  Surgeon: Katha Cabal, MD;  Location: [facility];  Service: Vascular;  Laterality: Right;  . WOUND DEBRIDEMENT Right 12/18/2018   Procedure: DEBRIDEMENT WOUND;  Surgeon: [name], MD;  Location: [facility];  Service: Vascular;  Laterality: Right;  right stump   Social History   Socioeconomic History  . Marital status: Married    Spouse name: Filbert Schilder   . Number of children: 0  . Years of education: Not on file  . Highest education  level: Not on file  Occupational History  . Not on file  Tobacco Use  . Smoking status: Never Smoker  . Smokeless tobacco: Never Used  Substance and Sexual Activity  . Alcohol use: No  . Drug use: No  . Sexual activity: Not on file  Other Topics Concern  . Not on file  Social History Narrative  . Not on file   Social Determinants of Health   Financial Resource Strain: Low Risk   . Difficulty of Paying Living Expenses: Not very hard  Food Insecurity: No Food Insecurity  . Worried About Charity fundraiser in the Last Year: Never true  . Ran Out of Food in the Last Year: Never true  Transportation Needs:   . Lack of Transportation (Medical): Not on file  . Lack of Transportation (Non-Medical): Not on file  Physical Activity:   . Days of Exercise per Week: Not on file  . Minutes of Exercise per Session: Not on file  Stress:   . Feeling of Stress : Not on file  Social Connections:   . Frequency of Communication with Friends and Family: Not on file  . Frequency of Social Gatherings with Friends and Family: Not on file  . Attends Religious Services: Not on file  . Active Member of Clubs or Organizations: Not on file  . Attends Archivist Meetings: Not on file  . Marital Status: Not on file  Intimate Partner Violence:   . Fear of Current or Ex-Partner: Not on file  . Emotionally Abused: Not on file  . Physically Abused: Not on file  . Sexually Abused: Not on file   No Known Allergies  Last set of Vital Signs (not current) Vitals:   10-31-19 1945 2019/10/31 1955  BP: 130/62 130/62  Pulse:    Resp:    Temp:    SpO2: 91% (!) 86%      Physical Exam  Gen: unresponsive Cardiovascular: pulseless.  Good femoral pulse with CPR Resp: apneic. Breath sounds equal bilaterally with bagging  Abd: nondistended  Neuro: GCS 3, unresponsive to pain  HEENT: No blood in posterior pharynx, gag reflex absent  Neck: No crepitus  Musculoskeletal: No deformity  Skin:  warm  Procedures  Procedure Name: Intubation Date/Time: Oct 31, 2019 10:07 PM Performed by: Carrie Mew, MD Pre-anesthesia Checklist: Patient identified, Patient being monitored, Emergency Drugs available, Timeout performed and Suction available Oxygen Delivery Method: Non-rebreather mask Preoxygenation: Pre-oxygenation with 100% oxygen Induction Type: Rapid sequence Ventilation: Mask ventilation without difficulty Laryngoscope Size: Glidescope and 3 Grade View: Grade I Tube size: 7.5 mm Number of attempts: 1 Placement Confirmation: ETT inserted  through vocal cords under direct vision and Breath sounds checked- equal and bilateral (condensation in tube, chest rise) Secured at: 23 cm Tube secured with: Tape Dental Injury: Teeth and Oropharynx as per pre-operative assessment  Difficulty Due To: Difficulty was anticipated Comments: Intubated during CPR.  Given succinylcholine only for fast as possible paralysis due to clenched jaw.  Avoid sedatives for possible worsening of vasodilation and patient already being comatose from cardiac arrest..  No emesis or other foreign body in the airway.  Bagged easily afterward.    .Critical Care Performed by: Carrie Mew, MD Authorized by: Carrie Mew, MD   Critical care provider statement:    Critical care time (minutes):  20   Critical care was necessary to treat or prevent imminent or life-threatening deterioration of the following conditions:  Cardiac failure, CNS failure or compromise and renal failure   Critical care was time spent personally by me on the following activities:  Discussions with consultants, evaluation of patient's response to treatment, examination of patient, ordering and performing treatments and interventions, ordering and review of laboratory studies, ordering and review of radiographic studies, pulse oximetry, re-evaluation of patient's condition, obtaining history from patient or surrogate and review of old  charts      Cardiopulmonary Resuscitation (CPR) Procedure Note  Directed/Performed by: Carrie Mew I personally directed ancillary staff and/or performed CPR in an effort to regain return of spontaneous circulation and to maintain cardiac, neuro and systemic perfusion. Total CPR time 20 minutes   Medical Decision making  Cardiac arrest resulting in death  See ACLS code documentation.  Patient given multiple doses of epinephrine, sodium bicarbonate, calcium chloride without improvement.  Initial rhythm was asystole, and on subsequent pulse checks the patient remained in asystole.  No shockable rhythms.  No return of spontaneous circulation.  No improvement after intubation.  Resuscitation efforts terminated at 8:40 PM.    Assessment and Plan  Final disposition per inpatient medicine team who is contacting patient's family    Carrie Mew, MD 10/25/19 2213

## 2019-10-16 NOTE — Progress Notes (Signed)
Pre hD Tx Note   October 29, 2019 1915  Hand-Off documentation  Report given to (Full Name) Newt Minion RN   Report received from (Full Name) Amy RN   Vital Signs  Temp 100.1 F (37.8 C)  Temp Source Oral  Pulse Rate 80  Pulse Rate Source Monitor  Resp 20  BP (!) 87/51  BP Location Right Arm  BP Method Automatic  Patient Position (if appropriate) Lying  Oxygen Therapy  SpO2 98 %  O2 Device Room Air  Pain Assessment  Pain Scale 0-10  Pain Score 0  Dialysis Weight  Weight 101 kg  Type of Weight Pre-Dialysis  Time-Out for Hemodialysis  What Procedure? HD   Pt Identifiers(min of two) First/Last Name;MRN/Account#  Correct Site? Yes  Correct Side? Yes  Correct Procedure? Yes  Consents Verified? Yes  Safety Precautions Reviewed? Yes  Engineer, civil (consulting) Number 3  Station Number  [id number])  UF/Alarm Test Passed  Conductivity: Meter 14  Conductivity: Machine  13.6  pH 7.2  Reverse Osmosis  (RO4)  Normal Saline Lot Number [id number]  Dialyzer Lot Number [id number]  Disposable Set Lot Number [id number]  Machine Temperature 98.6 F (37 C)  Musician and Audible Yes  Blood Lines Intact and Secured Yes  Pre Treatment Patient Checks  Vascular access used during treatment Catheter  HD catheter dressing before treatment WDL  Patient is receiving dialysis in a chair  (in bed)  Hepatitis B Surface Antigen Results Pending  Hepatitis B Surface Antibody  (Unknown)  Hemodialysis Consent Verified Other (Comment) (Pt Coded)  Hemodialysis Standing Orders Initiated Yes  ECG (Telemetry) Monitor On Yes  Prime Ordered Normal Saline  Length of  DialysisTreatment -hour(s) 3 Hour(s)  Dialysis Treatment Comments  (Na140)  Dialyzer Elisio 17H NR  Dialysate 2K;2.5 Ca  Dialysis Anticoagulant None  Dialysate Flow Ordered 600  Blood Flow Rate Ordered 400 mL/min  Ultrafiltration Goal 500 Liters  Blood Pressure Parameters  (523ml)  Dialysis Blood Pressure Support Ordered Albumin  Education  / Care Plan  Dialysis Education Provided Yes  Documented Education in Care Plan Yes  Hemodialysis Catheter Right Internal jugular Double lumen Permanent (Tunneled)  Placement Date/Time: 07/22/19 1555   Time Out: Correct patient;Correct site;Correct procedure  Maximum sterile barrier precautions: Hand hygiene;Cap;Mask;Sterile gown;Large sterile sheet;Sterile gloves  Site Prep: Chlorhexidine (preferred)  Local Anes...  Site Condition No complications  Blue Lumen Status Flushed;Blood return noted  Red Lumen Status Flushed;Blood return noted  Purple Lumen Status N/A  Dressing Type Gauze/Drain sponge  Dressing Status Clean;Dry;Intact  Drainage Description None

## 2019-10-16 NOTE — ED Triage Notes (Signed)
Pt arrived from home via [facility], pt went to dialysis and had a reported fever of 106.3.

## 2019-10-16 NOTE — Progress Notes (Signed)
Pre HD Tx Assessment   08-Oct-2019 1910  Neurological  Level of Consciousness Alert  Orientation Level Oriented X4  Respiratory  Respiratory Pattern Regular;Unlabored  Chest Assessment Chest expansion symmetrical  Bilateral Breath Sounds Diminished  Cardiac  Pulse Irregular  Cardiac Rhythm Atrial fibrillation  Heart Block Type 1st degree AVB  Antiarrhythmic device No  Vascular  R Radial Pulse +2  L Radial Pulse +2  Integumentary  Integumentary (WDL) X  Skin Color Pale (dry)  Musculoskeletal  Musculoskeletal (WDL) X  Generalized Weakness Yes  Gastrointestinal  Bowel Sounds Assessment Hypoactive  Last BM Date  (Pt is unable to tell)  GU Assessment  Genitourinary (WDL) X  Genitourinary Symptoms Anuria (HD Pt)  Psychosocial  Psychosocial (WDL) WDL

## 2019-10-16 NOTE — ED Provider Notes (Signed)
[facility] Emergency Department Provider Note       Time seen: ----------------------------------------- 7:08 AM on 2019-11-02 -----------------------------------------   I have reviewed the triage vital signs and the nursing notes.  HISTORY   Chief Complaint Fever    HPI [name] is a 59 y.o. male with a history of heart failure, anemia, coronary artery disease, diabetes, end-stage renal disease on dialysis, GERD, hyperlipidemia, hypertension who presents to the ED for fever.  Patient arrived from home via [facility].  He went to dialysis and reportedly had a fever of 106.  Patient states he has had some cough.  Otherwise he denies complaints.  Past Medical History:  Diagnosis Date  . (HFpEF) heart failure with preserved ejection fraction ([place])    a. 2017 EF per LHC 30-35%; b. 09/2018 Echo: EF 55-60%, no rwma, Gr2 DD, mild MR, mildly dil LA. PASP 41mmHg. small to mod circumferential pericardial eff, no tamponade.  . Anemia of chronic disease   . Atherosclerosis   . CAD (coronary artery disease)    a. 2017 LHC, severe 1v disease, occluded pRCA, L-R collaterals, moderate LV dysfunction EF 30-35.  . Diabetes mellitus with complication ([place])   . End stage renal disease on dialysis ([place])    Labile BP  . GERD (gastroesophageal reflux disease)   . History of ventricular tachycardia    a. 2017 polymorphic VT and cardiac arrest.  . Hypercholesteremia   . Hyperlipidemia LDL goal <70   . Hypertension   . Labile blood pressure    on HD  . Myocardial infarction ([place])    heart stopped 2019  . NSVT (nonsustained ventricular tachycardia) ([place])    08/2018 NSVT  . PAF (paroxysmal atrial fibrillation) ([place])    a. 08/2018 CHA2DS2VASc at least 6-->initially placed on eliquis but d/c'd 2/2 anemia. On Amio.  Marland Kitchen Peripheral arterial occlusive disease (HCC)    s/p L BKA, R AKA  . PONV (postoperative nausea and vomiting)   . S/P AKA (above knee amputation)  unilateral, right ([place])    08/2018  . S/P BKA (below knee amputation) unilateral, left ([place])    2015  . Stroke [facility])     Patient Active Problem List   Diagnosis Date Noted  . Complication of above knee amputation stump ([place]) 10/20/2018  . Acute pulmonary edema (HCC)   . Pressure injury of skin 09/26/2018  . Paroxysmal atrial fibrillation (HCC)   . Sepsis ([place]) 09/21/2018  . Status post above-knee amputation ([place]) 09/18/2018  . End stage renal disease on dialysis ([place])   . NSVT (nonsustained ventricular tachycardia) ([place])   . Atrial fibrillation with rapid ventricular response ([place])   . Demand ischemia ([place])   . Limb ischemia 09/03/2018  . Arterial occlusion 08/12/2018  . ESRD on dialysis ([place]) 11/09/2016  . Diabetes ([place]) 11/09/2016  . PVD (peripheral vascular disease) ([place]) 11/09/2016  . Cardiac arrest ([place]) 04/28/2016  . Atherosclerosis of extremity with gangrene ([place]) 04/25/2016  . Diabetic polyneuropathy associated with type 2 diabetes mellitus ([place]) 12/09/2015  . Diabetic ulcer of right foot associated with type 2 diabetes mellitus ([place]) 12/09/2015  . Anemia 03/24/2014  . Left foot burn 03/17/2014  . Leukocytosis 03/17/2014  . Obesity 03/17/2014  . PND (paroxysmal nocturnal dyspnea) 06/29/2013  . Volume overload 06/29/2013  . Diastolic congestive heart failure, NYHA class 2 ([place]) 06/28/2013  . Shortness of breath 06/28/2013  . After-cataract with vision obscured 08/01/2011  . Anisometropia 08/01/2011  . Background diabetic retinopathy ([place]) 08/01/2011  .  Senile nuclear sclerosis 08/01/2011    Past Surgical History:  Procedure Laterality Date  . A/V FISTULAGRAM Left 01/09/2017   Procedure: A/V Fistulagram;  Surgeon: [name], MD;  Location: [place] [facility];  Service: Cardiovascular;  Laterality: Left;  . A/V FISTULAGRAM Left 06/06/2017   Procedure: A/V Fistulagram;  Surgeon: [name], MD;  Location: [place] [facility];  Service: Cardiovascular;   Laterality: Left;  . A/V FISTULAGRAM Left 08/15/2017   Procedure: A/V Fistulagram;  Surgeon: [name], MD;  Location: [place] [facility];  Service: Cardiovascular;  Laterality: Left;  . A/V FISTULAGRAM Left 01/22/2018   Procedure: A/V FISTULAGRAM;  Surgeon: [name], MD;  Location: [place] [facility];  Service: Cardiovascular;  Laterality: Left;  . A/V FISTULAGRAM Left 07/16/2019   Procedure: A/V FISTULAGRAM;  Surgeon: [name], MD;  Location: [place] [facility];  Service: Cardiovascular;  Laterality: Left;  . A/V SHUNT INTERVENTION N/A 06/06/2017   Procedure: A/V SHUNT INTERVENTION;  Surgeon: [name], MD;  Location: [place] [facility];  Service: Cardiovascular;  Laterality: N/A;  . AMPUTATION Right 04/25/2016   Procedure: TOE AMPUTATION;  Surgeon: Katha Cabal, MD;  Location: [facility];  Service: Vascular;  Laterality: Right;  . AMPUTATION Right 09/09/2018   Procedure: AMPUTATION ABOVE KNEE;  Surgeon: [name], MD;  Location: [facility];  Service: Vascular;  Laterality: Right;  . APPLICATION OF WOUND VAC Right 04/25/2016   Procedure: APPLICATION OF WOUND VAC;  Surgeon: Katha Cabal, MD;  Location: [facility];  Service: Vascular;  Laterality: Right;  . APPLICATION OF WOUND VAC Right 12/18/2018   Procedure: APPLICATION OF WOUND VAC;  Surgeon: [name], MD;  Location: [facility];  Service: Vascular;  Laterality: Right;  wound vac  . AV FISTULA PLACEMENT Left 2015  . AV FISTULA PLACEMENT Left 07/30/2019   Procedure: ARTERIOVENOUS (AV) FISTULA CREATION (BRACHIAL CEPHALIC );  Surgeon: [name], MD;  Location: [facility];  Service: Vascular;  Laterality: Left;  . CARDIAC CATHETERIZATION N/A 04/30/2016   Procedure: Left Heart Cath and Coronary Angiography;  Surgeon: [place] Hampshire, MD;  Location: [place] [facility];  Service: Cardiovascular;  Laterality: N/A;  . CATARACT EXTRACTION, BILATERAL    . LEG AMPUTATION BELOW KNEE Left   . LOWER EXTREMITY ANGIOGRAPHY Right  07/24/2018   Procedure: LOWER EXTREMITY ANGIOGRAPHY;  Surgeon: [name], MD;  Location: [place] [facility];  Service: Cardiovascular;  Laterality: Right;  . LOWER EXTREMITY ANGIOGRAPHY Right 08/13/2018   Procedure: LOWER EXTREMITY ANGIOGRAPHY;  Surgeon: [name], MD;  Location: [place] [facility];  Service: Cardiovascular;  Laterality: Right;  . LOWER EXTREMITY ANGIOGRAPHY Right 09/04/2018   Procedure: Lower Extremity Angiography;  Surgeon: [name], MD;  Location: [place] [facility];  Service: Cardiovascular;  Laterality: Right;  . PERIPHERAL VASCULAR CATHETERIZATION N/A 10/03/2015   Procedure: A/V Shuntogram/Fistulagram;  Surgeon: [name], MD;  Location: [place] [facility];  Service: Cardiovascular;  Laterality: N/A;  . PERIPHERAL VASCULAR CATHETERIZATION N/A 01/16/2016   Procedure: Abdominal Aortogram w/Lower Extremity;  Surgeon: [name], MD;  Location: [place] [facility];  Service: Cardiovascular;  Laterality: N/A;  . PERIPHERAL VASCULAR CATHETERIZATION  01/16/2016   Procedure: Lower Extremity Intervention;  Surgeon: [name], MD;  Location: [place] [facility];  Service: Cardiovascular;;  . PERIPHERAL VASCULAR CATHETERIZATION Right 04/27/2016   Procedure: Lower Extremity Angiography;  Surgeon: Katha Cabal, MD;  Location: [place] [facility];  Service: Cardiovascular;  Laterality: Right;  . PERIPHERAL VASCULAR CATHETERIZATION  04/27/2016   Procedure: Lower Extremity Intervention;  Surgeon: Katha Cabal, MD;  Location: [place] [facility];  Service: Cardiovascular;;  . PERIPHERAL VASCULAR THROMBECTOMY Left 07/22/2019   Procedure: DIAYLSIS CATHETER INSERTION;  Surgeon: Katha Cabal, MD;  Location: [place] [facility];  Service: Cardiovascular;  Laterality: Left;  . TRANSMETATARSAL AMPUTATION Right 04/28/2016   Procedure: TRANSMETATARSAL AMPUTATION;  Surgeon: Albertine Patricia, DPM;  Location: [facility];  Service: Podiatry;  Laterality: Right;  . WOUND  DEBRIDEMENT Right 04/25/2016   Procedure: DEBRIDEMENT WOUND;  Surgeon: Katha Cabal, MD;  Location: [facility];  Service: Vascular;  Laterality: Right;  . WOUND DEBRIDEMENT Right 12/18/2018   Procedure: DEBRIDEMENT WOUND;  Surgeon: [name], MD;  Location: [facility];  Service: Vascular;  Laterality: Right;  right stump    Allergies Patient has no known allergies.  Social History Social History   Tobacco Use  . Smoking status: Never Smoker  . Smokeless tobacco: Never Used  Substance Use Topics  . Alcohol use: No  . Drug use: No    Review of Systems Constitutional: Positive for fever Cardiovascular: Negative for chest pain. Respiratory: Negative for shortness of breath.  Positive for cough  gastrointestinal: Negative for abdominal pain, vomiting and diarrhea. Musculoskeletal: Negative for back pain. Skin: Negative for rash. Neurological: Negative for headaches, focal weakness or numbness.  All systems negative/normal/unremarkable except as stated in the HPI  ____________________________________________   PHYSICAL EXAM:  VITAL SIGNS: ED Triage Vitals  Enc Vitals Group     BP      Pulse      Resp      Temp      Temp src      SpO2      Weight      Height      Head Circumference      Peak Flow      Pain Score      Pain Loc      Pain Edu?      Excl. in [place]?     Constitutional: Alert and oriented.  Chronically ill-appearing, no distress Eyes: Conjunctivae are normal. Normal extraocular movements. ENT      Head: Normocephalic and atraumatic.      Nose: No congestion/rhinnorhea.      Mouth/Throat: Mucous membranes are moist.      Neck: No stridor. Cardiovascular: Normal rate, regular rhythm. No murmurs, rubs, or gallops. Respiratory: Normal respiratory effort without tachypnea nor retractions. Breath sounds are clear and equal bilaterally. No wheezes/rales/rhonchi. Gastrointestinal: Soft and nontender. Normal bowel sounds Musculoskeletal: Bilateral amputee,  right upper chest tunneled catheter with some erythema noted. Neurologic:  Normal speech and language. No gross focal neurologic deficits are appreciated.  Skin:  Skin is warm, dry and intact.  Some scattered area of skin erythema noted Psychiatric: Mood and affect are normal. Speech and behavior are normal.  ____________________________________________  ED COURSE:  As part of my medical decision making, I reviewed the following data within the [place] History obtained from family if available, nursing notes, old chart and ekg, as well as notes from prior ED visits. Patient presented for possible sepsis, we will assess with labs and imaging as indicated at this time.   Procedures  [name] was evaluated in Emergency Department on November 02, 2019 for the symptoms described in the history of present illness. He was evaluated in the  context of the global COVID-19 pandemic, which necessitated consideration that the patient might be at risk for infection with the SARS-CoV-2 virus that causes COVID-19. Institutional protocols and algorithms that pertain to the evaluation of patients at risk for COVID-19 are in a state of rapid change based on information released by regulatory bodies including the CDC and federal and state organizations. These policies and algorithms were followed during the patient's care in the ED.  ____________________________________________   LABS (pertinent positives/negatives)  Labs Reviewed  LACTIC ACID, PLASMA - Abnormal; Notable for the following components:      Result Value   Lactic Acid, Venous 5.4 (*)    All other components within normal limits  COMPREHENSIVE METABOLIC PANEL - Abnormal; Notable for the following components:   Sodium 133 (*)    Chloride 90 (*)    CO2 19 (*)    Glucose, Bld 317 (*)    BUN 67 (*)    Creatinine, Ser 8.00 (*)    Albumin 3.4 (*)    Alkaline Phosphatase 213 (*)    GFR calc non Af Amer 7 (*)    GFR calc Af Amer 8  (*)    Anion gap 24 (*)    All other components within normal limits  CBC WITH DIFFERENTIAL/PLATELET - Abnormal; Notable for the following components:   WBC 27.5 (*)    RDW 17.2 (*)    All other components within normal limits  PROTIME-INR - Abnormal; Notable for the following components:   Prothrombin Time 15.7 (*)    INR 1.3 (*)    All other components within normal limits  CULTURE, BLOOD (ROUTINE X 2)  CULTURE, BLOOD (ROUTINE X 2)  RESPIRATORY PANEL BY RT PCR (FLU A&B, COVID)  LACTIC ACID, PLASMA   CRITICAL CARE Performed by: Laurence Aly   Total critical care time: 30 minutes  Critical care time was exclusive of separately billable procedures and treating other patients.  Critical care was necessary to treat or prevent imminent or life-threatening deterioration.  Critical care was time spent personally by me on the following activities: development of treatment plan with patient and/or surrogate as well as nursing, discussions with consultants, evaluation of patient's response to treatment, examination of patient, obtaining history from patient or surrogate, ordering and performing treatments and interventions, ordering and review of laboratory studies, ordering and review of radiographic studies, pulse oximetry and re-evaluation of patient's condition.  RADIOLOGY Images were viewed by me  Chest x-ray IMPRESSION: Central catheter as described without pneumothorax. Cardiomegaly with pulmonary vascular congestion and interstitial edema. Suspect a degree of congestive heart failure. Aortic Atherosclerosis (ICD10-I70.0). ____________________________________________   DIFFERENTIAL DIAGNOSIS   Sepsis, dialysis catheter infection, COVID-19, electrolyte abnormality, renal failure  FINAL ASSESSMENT AND PLAN  Fever, possible sepsis   Plan: The patient had presented for possible fever. Patient's labs did indicate significant leukocytosis with lactic acidosis and labs  consistent with end-stage renal disease on dialysis. Patient's imaging did not reveal any acute process.  Patient was started on sepsis protocols and given broad-spectrum antibiotics.  I did discuss with dialysis who arrange for dialysis for him while he is in the hospital.  Patient possibly has a dialysis catheter infection, reportedly has also been treated at the wound clinic for a dialysis graft infection.   Laurence Aly, MD    Note: This note was generated in part or whole with voice recognition software. Voice recognition is usually quite accurate but there are transcription errors that can and very often  do occur. I apologize for any typographical errors that were not detected and corrected.     Earleen Newport, MD October 25, 2019 [phone]

## 2019-10-16 NOTE — Progress Notes (Signed)
Remdesivir - Pharmacy Brief Note   O:  ALT: 9 CXR:   SpO2: 100%   A/P:  Remdesivir 200 mg IVPB once followed by 100 mg IVPB daily x 4 days.   Paulina Fusi, PharmD, BCPS Oct 15, 2019 12:00 PM

## 2019-10-16 NOTE — Progress Notes (Signed)
HD Tx Ended Pt started to desat. I increased his O2 to 10L per [place]. Primary RN made aware. About pt desating regardless me giving him O2. Venous pressure was high. I had to stop the tx. Pt reported pain during rinseback. Rinseback was stopped. I called for assistance. Primary RN initiated code blue. CPR initiated at bedside.   10-13-19 1955  Vital Signs  BP 130/62  BP Location Right Arm  BP Method Automatic  Patient Position (if appropriate) Lying  Oxygen Therapy  SpO2 (!) 86 %  O2 Device Nasal Cannula  O2 Flow Rate (L/min) 10 L/min  Pain Assessment  Pain Scale Faces  Pain Score 7  Pain Type Acute pain  Pain Location Chest  Pain Descriptors / Indicators Aching  Pain Intervention(s) RN made aware (increase O2 and initiate coding)  During Hemodialysis Assessment  Blood Flow Rate (mL/min) 250 mL/min  Arterial Pressure (mmHg) -150 mmHg  Venous Pressure (mmHg) 250 mmHg  Transmembrane Pressure (mmHg) 20 mmHg  Ultrafiltration Rate (mL/min) 130 mL/min  Dialysate Flow Rate (mL/min) 600 ml/min  Conductivity: Machine  14  HD Safety Checks Performed Yes  Intra-Hemodialysis Comments Tx completed (Tx Ended)

## 2019-10-16 NOTE — H&P (Addendum)
History and Physical    BARDIA WANGERIN VXB:[id number] DOB: 05/11/1961 DOA: 20-Oct-2019  PCP: [name], MD   Patient coming from: Home  I have personally briefly reviewed patient's old medical records in [place]  Chief Complaint: Fever.  HPI: LAVANCE BEAZER is a 59 y.o. male with medical history significant of heart failure, anemia, coronary artery disease, diabetes, end-stage renal disease on dialysis, GERD, hyperlipidemia, diastolic heart failure, hypertension now on midodrine due to hypotension who presents to the ED for fever from his dialysis center. Patient is a Spanish speaking and history was obtained with the help of an interpreter.  According to him patient develops generalized malaise and cough 2 days ago.  Yesterday he was having low-grade fever.  This morning he started high-grade fever with chills.  He was having mild nausea but no vomiting.  Did had couple of episodes of diarrhea over the past few days.  No bowel movement since morning.  He went for his dialysis where he was found to be febrile and was sent to ED.  Denies any sick contacts.  He was complaining of pain in his right buttock area.  He is also seen at wound care center for left cubital graft site. He has a right-sided tunneled catheter for dialysis.  ED Course: On presentation patient was febrile at 103.1 with mild tachycardia and tachypnea and a blood pressure of 100/48.  Positive for leukocytosis, neutrophil predominant with white cell count of 27.5, potassium 4.7, elevated alkaline phos at 213.  Chest x-ray with some pulmonary vascular congestion. COVID-19 test was positive.  Review of Systems: As per HPI otherwise 10 point review of systems negative.   Past Medical History:  Diagnosis Date  . (HFpEF) heart failure with preserved ejection fraction ([place])    a. 2017 EF per LHC 30-35%; b. 09/2018 Echo: EF 55-60%, no rwma, Gr2 DD, mild MR, mildly dil LA. PASP 64mHg. small to mod circumferential  pericardial eff, no tamponade.  . Anemia of chronic disease   . Atherosclerosis   . CAD (coronary artery disease)    a. 2017 LHC, severe 1v disease, occluded pRCA, L-R collaterals, moderate LV dysfunction EF 30-35.  . Diabetes mellitus with complication ([facility]   . End stage renal disease on dialysis ([facility]    Labile BP  . GERD (gastroesophageal reflux disease)   . History of ventricular tachycardia    a. 2017 polymorphic VT and cardiac arrest.  . Hypercholesteremia   . Hyperlipidemia LDL goal <70   . Hypertension   . Labile blood pressure    on HD  . [place]ocardial infarction ([facility]    heart stopped 2019  . NSVT (nonsustained ventricular tachycardia) ([facility]    08/2018 NSVT  . PAF (paroxysmal atrial fibrillation) ([facility]    a. 08/2018 CHA2DS2VASc at least 6-->initially placed on eliquis but d/c'd 2/2 anemia. On Amio.  .Marland KitchenPeripheral arterial occlusive disease (HCC)    s/p L BKA, R AKA  . PONV (postoperative nausea and vomiting)   . S/P AKA (above knee amputation) unilateral, right ([facility]    08/2018  . S/P BKA (below knee amputation) unilateral, left ([facility] [place]    2015  . Stroke ([facility]     Past Surgical History:  Procedure Laterality Date  . A/V FISTULAGRAM Left 01/09/2017   Procedure: A/V Fistulagram;  Surgeon: JAlgernon Huxley MD;  [facility];  Service: Cardiovascular;  Laterality: Left;  . A/V FISTULAGRAM Left 06/06/2017   Procedure: A/V Fistulagram;  Surgeon: [name], MD;  Location: [place] [facility];  Service: Cardiovascular;  Laterality: Left;  . A/V FISTULAGRAM Left 08/15/2017   Procedure: A/V Fistulagram;  Surgeon: [name], MD;  Location: [place] [facility];  Service: Cardiovascular;  Laterality: Left;  . A/V FISTULAGRAM Left 01/22/2018   Procedure: A/V FISTULAGRAM;  Surgeon: [name], MD;  Location: [place] [facility];  Service: Cardiovascular;  Laterality: Left;  . A/V FISTULAGRAM Left 07/16/2019   Procedure: A/V FISTULAGRAM;  Surgeon: [name], MD;   Location: [place] [facility];  Service: Cardiovascular;  Laterality: Left;  . A/V SHUNT INTERVENTION N/A 06/06/2017   Procedure: A/V SHUNT INTERVENTION;  Surgeon: [name], MD;  Location: [place] [facility];  Service: Cardiovascular;  Laterality: N/A;  . AMPUTATION Right 04/25/2016   Procedure: TOE AMPUTATION;  Surgeon: Katha Cabal, MD;  Location: [facility];  Service: Vascular;  Laterality: Right;  . AMPUTATION Right 09/09/2018   Procedure: AMPUTATION ABOVE KNEE;  Surgeon: [name], MD;  Location: [facility];  Service: Vascular;  Laterality: Right;  . APPLICATION OF WOUND VAC Right 04/25/2016   Procedure: APPLICATION OF WOUND VAC;  Surgeon: Katha Cabal, MD;  Location: [facility];  Service: Vascular;  Laterality: Right;  . APPLICATION OF WOUND VAC Right 12/18/2018   Procedure: APPLICATION OF WOUND VAC;  Surgeon: [name], MD;  Location: [facility];  Service: Vascular;  Laterality: Right;  wound vac  . AV FISTULA PLACEMENT Left 2015  . AV FISTULA PLACEMENT Left 07/30/2019   Procedure: ARTERIOVENOUS (AV) FISTULA CREATION (BRACHIAL CEPHALIC );  Surgeon: [name], MD;  Location: [facility];  Service: Vascular;  Laterality: Left;  . CARDIAC CATHETERIZATION N/A 04/30/2016   Procedure: Left Heart Cath and Coronary Angiography;  Surgeon: Wellington Hampshire, MD;  Location: [place] [facility];  Service: Cardiovascular;  Laterality: N/A;  . CATARACT EXTRACTION, BILATERAL    . LEG AMPUTATION BELOW KNEE Left   . LOWER EXTREMITY ANGIOGRAPHY Right 07/24/2018   Procedure: LOWER EXTREMITY ANGIOGRAPHY;  Surgeon: [name], MD;  Location: [place] [facility];  Service: Cardiovascular;  Laterality: Right;  . LOWER EXTREMITY ANGIOGRAPHY Right 08/13/2018   Procedure: LOWER EXTREMITY ANGIOGRAPHY;  Surgeon: [name], MD;  Location: [place] [facility];  Service: Cardiovascular;  Laterality: Right;  . LOWER EXTREMITY ANGIOGRAPHY Right 09/04/2018   Procedure: Lower Extremity Angiography;   Surgeon: [name], MD;  Location: [place] [facility];  Service: Cardiovascular;  Laterality: Right;  . PERIPHERAL VASCULAR CATHETERIZATION N/A 10/03/2015   Procedure: A/V Shuntogram/Fistulagram;  Surgeon: [name], MD;  Location: [place] [facility];  Service: Cardiovascular;  Laterality: N/A;  . PERIPHERAL VASCULAR CATHETERIZATION N/A 01/16/2016   Procedure: Abdominal Aortogram w/Lower Extremity;  Surgeon: [name], MD;  Location: [place] [facility];  Service: Cardiovascular;  Laterality: N/A;  . PERIPHERAL VASCULAR CATHETERIZATION  01/16/2016   Procedure: Lower Extremity Intervention;  Surgeon: [name], MD;  Location: [place] [facility];  Service: Cardiovascular;;  . PERIPHERAL VASCULAR CATHETERIZATION Right 04/27/2016   Procedure: Lower Extremity Angiography;  Surgeon: Katha Cabal, MD;  Location: [place] [facility];  Service: Cardiovascular;  Laterality: Right;  . PERIPHERAL VASCULAR CATHETERIZATION  04/27/2016   Procedure: Lower Extremity Intervention;  Surgeon: Katha Cabal, MD;  Location: [place] [facility];  Service: Cardiovascular;;  . PERIPHERAL VASCULAR THROMBECTOMY Left 07/22/2019   Procedure: DIAYLSIS CATHETER INSERTION;  Surgeon:  Schnier, Dolores Lory, MD;  Location: [place] [facility];  Service: Cardiovascular;  Laterality: Left;  . TRANSMETATARSAL AMPUTATION Right 04/28/2016   Procedure: TRANSMETATARSAL AMPUTATION;  Surgeon: Albertine Patricia, DPM;  Location: [facility];  Service: Podiatry;  Laterality: Right;  . WOUND DEBRIDEMENT Right 04/25/2016   Procedure: DEBRIDEMENT WOUND;  Surgeon: Katha Cabal, MD;  Location: [facility];  Service: Vascular;  Laterality: Right;  . WOUND DEBRIDEMENT Right 12/18/2018   Procedure: DEBRIDEMENT WOUND;  Surgeon: [name], MD;  Location: [facility];  Service: Vascular;  Laterality: Right;  right stump     reports that he has never smoked. He has never used smokeless tobacco. He reports that he does not drink alcohol or use  drugs.  No Known Allergies  Family History  Problem Relation Age of Onset  . Diabetes Father   . Prostate cancer Father     Prior to Admission medications   Medication Sig Start Date End Date Taking? Authorizing Provider  acetaminophen (TYLENOL) 325 MG tablet Take 650 mg by mouth every 6 (six) hours as needed.    [provider]  albuterol (ACCUNEB) 0.63 MG/3ML nebulizer solution Take 3 mLs (0.63 mg total) by nebulization every 4 (four) hours as needed for wheezing. 10/11/18   Salary, Avel Peace, MD  amiodarone (PACERONE) 200 MG tablet Take 1 tablet (200 mg total) by mouth daily. 11/25/18   Rise Mu, PA-C  aspirin EC 81 MG tablet Take 81 mg by mouth daily.    [provider]  b complex-vitamin c-folic acid (NEPHRO-VITE) 0.8 MG TABS tablet Take 1 tablet by mouth daily.    [provider]  cephALEXin (KEFLEX) 500 MG capsule Take 1 capsule (500 mg total) by mouth 2 (two) times daily. Patient not taking: Reported on 07/28/2019 01/02/19   Kris Hartmann, NP  cephALEXin (KEFLEX) 500 MG capsule Take 1 capsule (500 mg total) by mouth 3 (three) times daily. Patient not taking: Reported on 07/28/2019 01/20/19   Kris Hartmann, NP  cinacalcet (SENSIPAR) 60 MG tablet Take 60 mg by mouth daily.     [provider]  ferrous sulfate 325 (65 FE) MG EC tablet Take 325 mg by mouth 3 (three) times daily with meals.    [provider]  gabapentin (NEURONTIN) 100 MG capsule Take 1 capsule (100 mg total) by mouth 3 (three) times daily. 11/18/18   Kris Hartmann, NP  glipiZIDE (GLUCOTROL) 10 MG tablet Take 10 mg by mouth daily.  10/25/16   [provider]  lactulose (CEPHULAC) 20 g packet Take 1 packet (20 g total) by mouth 3 (three) times daily. Patient taking differently: Take 20 g by mouth daily as needed.  10/11/18   Salary, Avel Peace, MD  lidocaine-prilocaine (EMLA) cream Apply 1 application topically as needed (port access).    [provider]    midodrine (PROAMATINE) 10 MG tablet Take 1 tablet (10 mg total) by mouth 3 (three) times daily with meals. 09/28/18   Bettey Costa, MD  Nutritional Supplements (FEEDING SUPPLEMENT, NEPRO CARB STEADY,) LIQD Take 237 mLs by mouth 2 (two) times daily between meals. 09/28/18   Bettey Costa, MD  omeprazole (PRILOSEC) 40 MG capsule Take 40 mg by mouth 2 (two) times daily.     [provider]  ondansetron (ZOFRAN) 8 MG tablet Take 8 mg by mouth every 8 (eight) hours as needed.  11/20/18   [provider]  oxyCODONE (OXY IR/ROXICODONE) 5 MG immediate release tablet Take  1 tablet (5 mg total) by mouth every 6 (six) hours as needed for moderate pain or severe pain. 08/27/19   Kris Hartmann, NP  polyethylene glycol (MIRALAX / GLYCOLAX) packet Take 17 g by mouth 2 (two) times daily.     [provider]  sevelamer carbonate (RENVELA) 800 MG tablet Take 2,400 mg by mouth 3 (three) times daily with meals.     [provider]  silver sulfADIAZINE (SILVADENE) 1 % cream Apply 1 application topically daily. 01/20/19   Kris Hartmann, NP  Wound Dressings (AQUACEL EXTRA HYDROFIBER [phone]) PADS Apply 1 Package topically 3 (three) times a week. 01/02/19   Kris Hartmann, NP    Physical Exam: Vitals:   10-13-19 0711 October 13, 2019 0714 Oct 13, 2019 0800 10/13/2019 0900  BP: (!) 114/91  (!) 107/53 (!) 100/48  Pulse: 95  96   Resp: 18  (!) 30 (!) 25  Temp: (!) 103.1 F (39.5 C)     TempSrc: Oral     SpO2: 97%  100% 98%  Weight:  101 kg    Height:  5' 7"  (1.702 m)      General: Vital signs reviewed.  Patient is well-developed and well-nourished, in no acute distress and cooperative with exam.  Head: Normocephalic and atraumatic. Eyes: EOMI, conjunctivae normal, no scleral icterus.  ENMT: Mucous membranes are dry. Posterior pharynx clear of any exudate or lesions.Normal dentition.  Neck: Supple, trachea midline, normal ROM, no JVD, masses, thyromegaly, or carotid bruit present.  Cardiovascular:  RRR, S1 normal, S2 normal, no murmurs, gallops, or rubs. Pulmonary/Chest: Clear to auscultation bilaterally, no wheezes, rales, or rhonchi. Abdominal: Soft, non-tender, non-distended, BS +,  Extremities: Right AKI and left BKA, no obvious inflammation around stump area. Neurological: A&O x3, Strength is normal and symmetric bilaterally, cranial nerve II-XII are grossly intact, no focal motor deficit, sensory intact to light touch bilaterally.  Skin: Erythema and mild edema involving right buttock, no skin breakage or discharge, mild erythema with a well-healing wound on left cubital area, no obvious discharge.  Mild erythema surrounding tunneled catheter with no obvious discharge. Psychiatric: Normal mood and affect. speech and behavior is normal. Cognition and memory are normal.   Labs on Admission: I have personally reviewed following labs and imaging studies  CBC: Recent Labs  Lab 10-13-2019 0730  WBC 27.5*  NEUTROABS 24.6*  HGB 13.1  HCT 42.0  MCV 91.3  PLT 350   Basic Metabolic Panel: Recent Labs  Lab 10/13/2019 0730  NA 133*  K 4.7  CL 90*  CO2 19*  GLUCOSE 317*  BUN 67*  CREATININE 8.00*  CALCIUM 9.7   GFR: Estimated Creatinine Clearance: 11.1 mL/min (A) (by C-G formula based on SCr of 8 mg/dL (H)). Liver Function Tests: Recent Labs  Lab 13-Oct-2019 0730  AST 18  ALT 9  ALKPHOS 213*  BILITOT 0.7  PROT 7.5  ALBUMIN 3.4*   No results for input(s): LIPASE, AMYLASE in the last 168 hours. No results for input(s): AMMONIA in the last 168 hours. Coagulation Profile: Recent Labs  Lab 13-Oct-2019 0730  INR 1.3*   Cardiac Enzymes: No results for input(s): CKTOTAL, CKMB, CKMBINDEX, TROPONINI in the last 168 hours. BNP (last 3 results) No results for input(s): PROBNP in the last 8760 hours. HbA1C: No results for input(s): HGBA1C in the last 72 hours. CBG: No results for input(s): GLUCAP in the last 168 hours. Lipid Profile: No results for input(s): CHOL, HDL, LDLCALC,  TRIG, CHOLHDL, LDLDIRECT in the last  72 hours. Thyroid Function Tests: No results for input(s): TSH, T4TOTAL, FREET4, T3FREE, THYROIDAB in the last 72 hours. Anemia Panel: No results for input(s): VITAMINB12, FOLATE, FERRITIN, TIBC, IRON, RETICCTPCT in the last 72 hours. Urine analysis: No results found for: COLORURINE, APPEARANCEUR, [place], [place], [place], [place], [place], [place], [place], [place], NITRITE, LEUKOCYTESUR  Radiological Exams on Admission: DG Chest Port 1 View  Result Date: 11-05-2019 CLINICAL DATA:  Fever EXAM: PORTABLE CHEST 1 VIEW COMPARISON:  September 21, 2008 FINDINGS: There is a dual lumen catheter with tips in the right atrium. No pneumothorax. There is cardiomegaly with pulmonary venous hypertension. There is interstitial edema. There is no consolidation or appreciable pleural effusion. There is aortic atherosclerosis. No adenopathy. No bone lesions. IMPRESSION: Central catheter as described without pneumothorax. Cardiomegaly with pulmonary vascular congestion and interstitial edema. Suspect a degree of congestive heart failure. Aortic Atherosclerosis (ICD10-I70.0). Electronically Signed   By: Lowella Grip III M.D.   On: 11-05-2019 07:51    EKG: Independently reviewed.  Sinus rhythm with repolarization defects.  Nonspecific ST changes.  Assessment/Plan Active Problems:   * No active hospital problems. *   Sepsis.  Patient did met SIRS criteria on arrival with being febrile, leukocytosis, tachycardic and tachypneic.  He was high risk with multiple comorbidities which include diabetes complicated with end-stage renal disease, bilateral lower extremities amputation, hyperlipidemia and hypertension, recent graft infection and now diagnosed with COVID-19 infection. He was severely volume overload on presentation so was not given a lot of fluids. He was started on broad-spectrum antibiotics. -Blood cultures are pending.  COVID-19 infection.  Patient is  saturating well on room air with no obvious sign of pulmonary infiltrate at this time. -Check inflammatory markers. -.Start him on Decadron and remdesivir. -Vitamin C and zinc supplement. Patient is high risk due to multiple comorbidities which can make him more prone to thromboembolic event which include ESRD and recent COVID-19 infection.  Cellulitis.  Patient recently been treated at wound care center due to graft site infection and wound dehiscence, there was mild erythema with no obvious discharge.  Left buttock with erythema and edema, he was complaining of pain there.  Patient mostly bedridden.  No obvious skin breakage. There was mild erythema around tunneled catheter with no obvious discharge. He was given Vanco and Zosyn in the ED. -Ceftriaxone for nonpurulent cellulitis-we will readjust antibiotics according to blood culture results.  Diastolic heart failure.  Patient with severe volume overload. Volume being managed with dialysis as patient does not make any urine.  ESRD.  Nephrology was consulted and he will continue with dialysis.  Hypotension.  Patient with chronic hypotension. -Continue home dose of midodrine.  Diabetes. -Renal SSI-needed CBG as patient is being given steroid.  DVT prophylaxis: Heparin Code Status: Full code Family Communication: Wife was updated on phone Disposition Plan: [place] improvement Consults called: Nephrology Admission status: Inpatient   Lorella Nimrod MD [facility] Pager [phone]  If 7PM-7AM, please contact night-coverage [email or web address] Password [facility]  11/05/2019, 9:44 AM   This record has been created using Dragon voice recognition software. Errors have been sought and corrected,but may not always be located. Such creation errors do not reflect on the standard of care.

## 2019-10-16 NOTE — Death Summary Note (Addendum)
Death Summary  [name]:[id number] DOB: 08-Nov-1960 DOA: 10-12-19  PCP: [name], MD  Admit date: 2019-10-12 Date of Death: Oct 15, 2019 Time of Death: 8:40PM Notification: [name], MD notified of death of 10-15-2019   History of present illness:  [name] is a 59 y.o. male with a history of diabetes complicated with ESRD on hemodialysis, bilateral lower extremity amputations, right AKA and left BKA, hyperlipidemia, diastolic heart failure, hypertension now mostly hypotensive and was on midodrine 3 times daily, recent graft infection and getting wound care management. [name] presented with complaint of high-grade fever and generalized malaise from his dialysis center, met sepsis criteria on presentation with being febrile, tachycardic, tachypneic, hypotensive and leukocytosis.  He was also found to be positive for COVID-19 infection. [name] did not improve after getting broad-spectrum antibiotics and starting of remdesivir with steroid for his recent COVID-19 infection.  He was high risk for any thromboembolic event due to his underlying comorbidities which includes diabetes and ESRD and now recent infection with COVID-19.  He was started on Heparin prophylaxis.  Patient was hypotensive with severely volume overload, received some fluids but nephrology does not want to give him more fluid and he was taken for dialysis.  He has an history of chronic hypotension requiring midodrine 10 mg 3 times a day.  Midodrine was administered before dialysis.  Soon after starting dialysis patient suddenly becomes unresponsive and a CODE BLUE was called.  Patient was resuscitated with no response.  He was found to be in asystole.  He was declared dead at 8:40 PM. Overnight events were taken from charting.  Final Diagnoses:  1.  Cardiopulmonary arrest. 2.  Septic shock 3.  COVID-19 infection.  The results of significant diagnostics from this  hospitalization (including imaging, microbiology, ancillary and laboratory) are listed below for reference.    Significant Diagnostic Studies: DG Chest Port 1 View  Result Date: 10/12/2019 CLINICAL DATA:  Fever EXAM: PORTABLE CHEST 1 VIEW COMPARISON:  September 21, 2008 FINDINGS: There is a dual lumen catheter with tips in the right atrium. No pneumothorax. There is cardiomegaly with pulmonary venous hypertension. There is interstitial edema. There is no consolidation or appreciable pleural effusion. There is aortic atherosclerosis. No adenopathy. No bone lesions. IMPRESSION: Central catheter as described without pneumothorax. Cardiomegaly with pulmonary vascular congestion and interstitial edema. Suspect a degree of congestive heart failure. Aortic Atherosclerosis (ICD10-I70.0). Electronically Signed   By: Lowella Grip III M.D.   On: 12-Oct-2019 07:51    Microbiology: Recent Results (from the past 240 hour(s))  Blood Culture (routine x 2)     Status: Abnormal   Collection Time: October 12, 2019  7:30 AM   Specimen: BLOOD  Result Value Ref Range Status   Specimen Description   Final    BLOOD RIGHT FA Performed at [facility], [address], [place], [place] [address]    Special Requests   Final    BOTTLES DRAWN AEROBIC AND ANAEROBIC Blood Culture adequate volume Performed at [facility], [address], [place], [place] [address]    Culture  Setup Time   Final    GRAM NEGATIVE RODS ANAEROBIC BOTTLE ONLY PATIENT DISCHARGED OR EXPIRED Performed at [facility] [address] [place], [place] [address]    Culture (A)  Final    BACTEROIDES FRAGILIS BETA LACTAMASE POSITIVE Performed at [facility], [place] [address], [place], [place] [address]    Report Status 15-Oct-2019 FINAL  Final  Blood Culture (routine x 2)     Status: Abnormal   Collection Time: 2019-10-24  7:30 AM   Specimen: BLOOD  Result Value Ref Range Status   Specimen Description   Final     BLOOD RIGHT [name] Performed at [facility], [address] [place], [place] [address]    Special Requests   Final    BOTTLES DRAWN AEROBIC AND ANAEROBIC Blood Culture adequate volume Performed at [facility], [address] [place], [place] [address]    Culture  Setup Time   Final    ANAEROBIC BOTTLE ONLY GRAM NEGATIVE RODS PATIENT DISCHARGED OR EXPIRED RESULT CALLED TO, READ BACK BY AND VERIFIED WITH: Winfield Rast PATER AT [id number] ON 10/12/2019 RH      Culture (A)  Final    BACTEROIDES FRAGILIS BETA LACTAMASE POSITIVE Performed at [facility], [place] [address] [place], [place] [address]    Report Status 10/10/2019 FINAL  Final  Respiratory Panel by RT PCR (Flu A&B, Covid) - Nasopharyngeal Swab     Status: Abnormal   Collection Time: 10-24-19  7:30 AM   Specimen: Nasopharyngeal Swab  Result Value Ref Range Status   SARS Coronavirus 2 by RT PCR POSITIVE (A) NEGATIVE Final    Comment: RESULT CALLED TO, READ BACK BY AND VERIFIED WITH: [facility] PARK RN AT [phone] ON 10-24-19 HASSANR (NOTE) SARS-CoV-2 target nucleic acids are DETECTED. SARS-CoV-2 RNA is generally detectable in upper respiratory specimens  during the acute phase of infection. Positive results are indicative of the presence of the identified virus, but do not rule out bacterial infection or co-infection with other pathogens not detected by the test. Clinical correlation with patient history and other diagnostic information is necessary to determine patient infection status. The expected result is Negative. Fact Sheet for Patients:  PinkCheek.be Fact Sheet for Healthcare Providers: GravelBags.it This test is not yet approved or cleared by the [place] FDA and  has been authorized for detection and/or diagnosis of SARS-CoV-2 by FDA under an Emergency Use Authorization (EUA).  This EUA will remain in effect (meaning this test can be  used) for  the duration of  the COVID-19 declaration under Section 564(b)(1) of the Act, 21 U.S.C. section 360bbb-3(b)(1), unless the authorization is terminated or revoked sooner.    Influenza A by PCR NEGATIVE NEGATIVE Final   Influenza B by PCR NEGATIVE NEGATIVE Final    Comment: (NOTE) The Xpert Xpress SARS-CoV-2/FLU/RSV assay is intended as an aid in  the diagnosis of influenza from Nasopharyngeal swab specimens and  should not be used as a sole basis for treatment. Nasal washings and  aspirates are unacceptable for Xpert Xpress SARS-CoV-2/FLU/RSV  testing. Fact Sheet for Patients: PinkCheek.be Fact Sheet for Healthcare Providers: GravelBags.it This test is not yet approved or cleared by the [place] FDA and  has been authorized for detection and/or diagnosis of SARS-CoV-2 by  FDA under an Emergency Use Authorization (EUA). This EUA will remain  in effect (meaning this test can be used) for the duration of the  Covid-19 declaration under Section 564(b)(1) of the Act, 21  U.S.C. section 360bbb-3(b)(1), unless the authorization is  terminated or revoked. Performed at [facility], [address] [place], [place] [address]   Blood Culture ID Panel (Reflexed)     Status: None   Collection Time: 10-24-19  7:30 AM  Result Value Ref Range Status   Enterococcus species NOT DETECTED NOT DETECTED Final   Listeria monocytogenes NOT  DETECTED NOT DETECTED Final   Staphylococcus species NOT DETECTED NOT DETECTED Final   Staphylococcus aureus (BCID) NOT DETECTED NOT DETECTED Final   Streptococcus species NOT DETECTED NOT DETECTED Final   Streptococcus agalactiae NOT DETECTED NOT DETECTED Final   Streptococcus pneumoniae NOT DETECTED NOT DETECTED Final   Streptococcus pyogenes NOT DETECTED NOT DETECTED Final   Acinetobacter baumannii NOT DETECTED NOT DETECTED Final   Enterobacteriaceae species NOT DETECTED NOT DETECTED Final    Enterobacter cloacae complex NOT DETECTED NOT DETECTED Final   Escherichia coli NOT DETECTED NOT DETECTED Final   Klebsiella oxytoca NOT DETECTED NOT DETECTED Final   Klebsiella pneumoniae NOT DETECTED NOT DETECTED Final   Proteus species NOT DETECTED NOT DETECTED Final   Serratia marcescens NOT DETECTED NOT DETECTED Final   Haemophilus influenzae NOT DETECTED NOT DETECTED Final   Neisseria meningitidis NOT DETECTED NOT DETECTED Final   Pseudomonas aeruginosa NOT DETECTED NOT DETECTED Final   Candida albicans NOT DETECTED NOT DETECTED Final   Candida glabrata NOT DETECTED NOT DETECTED Final   Candida krusei NOT DETECTED NOT DETECTED Final   Candida parapsilosis NOT DETECTED NOT DETECTED Final   Candida tropicalis NOT DETECTED NOT DETECTED Final    Comment: Performed at [facility], [address] [place], [place] [address]     Labs: Basic Metabolic Panel: Recent Labs  Lab October 10, 2019 0730 10-10-2019 1705  NA 133*  --   K 4.7  --   CL 90*  --   CO2 19*  --   GLUCOSE 317*  --   BUN 67*  --   CREATININE 8.00* 8.84*  CALCIUM 9.7  --    Liver Function Tests: Recent Labs  Lab 10/10/2019 0730  AST 18  ALT 9  ALKPHOS 213*  BILITOT 0.7  PROT 7.5  ALBUMIN 3.4*   No results for input(s): LIPASE, AMYLASE in the last 168 hours. No results for input(s): AMMONIA in the last 168 hours. CBC: Recent Labs  Lab Oct 10, 2019 0730 10-Oct-2019 1705  WBC 27.5* 39.4*  NEUTROABS 24.6*  --   HGB 13.1 11.3*  HCT 42.0 35.2*  MCV 91.3 88.4  PLT 274 222   Cardiac Enzymes: No results for input(s): CKTOTAL, CKMB, CKMBINDEX, TROPONINI in the last 168 hours. D-Dimer No results for input(s): DDIMER in the last 72 hours. BNP: Invalid input(s): POCBNP CBG: Recent Labs  Lab Oct 10, 2019 1338 2019-10-10 1645 10/10/2019 2028  GLUCAP 308* 282* 263*   Anemia work up Recent Labs    2019/10/10 1705  FERRITIN 588*   Urinalysis No results found for: COLORURINE, APPEARANCEUR, LABSPEC, PHURINE,  GLUCOSEU, HGBUR, BILIRUBINUR, KETONESUR, PROTEINUR, UROBILINOGEN, NITRITE, LEUKOCYTESUR Sepsis Labs Invalid input(s): PROCALCITONIN,  WBC,  LACTICIDVEN  SIGNED:  Lorella Nimrod, MD  [facility] 10/10/2019, 11:12 AM Pager   If 7PM-7AM, please contact night-coverage [email or web address] Password [email or web address]  This record has been created using Systems analyst. Errors have been sought and corrected,but may not always be located. Such creation errors do not reflect on the standard of care.

## 2019-10-16 NOTE — ED Notes (Signed)
NS Bolus discontinued

## 2019-10-16 NOTE — Progress Notes (Signed)
[facility]  ROUNDING NOTE   Subjective:   [name] admitted to [facility] on 10-26-19 for COVID-19 virus infection [U07.1]   Patient presented to dialysis where he was found to have a fever. He was then sent to the ED for evaluation where he was found to have sepsis and COVID+  Objective:  Vital signs in last 24 hours:  Temp:  [103.1 F (39.5 C)] 103.1 F (39.5 C) (12/23 0711) Pulse Rate:  [81-96] 81 (12/23 1215) Resp:  [14-30] 20 (12/23 1215) BP: (56-114)/(28-91) 94/48 (12/23 1215) SpO2:  [97 %-100 %] 100 % (12/23 1215) Weight:  [101 kg] 101 kg (12/23 0714)  Weight change:  Filed Weights   10/26/2019 0714  Weight: 101 kg    Intake/Output: No intake/output data recorded.   Intake/Output this shift:  Total I/O In: 250 [IV Piggyback:250] Out: -   Physical Exam: General: NAD, laying on stretcher  Head: Normocephalic, atraumatic. Moist oral mucosal membranes  Eyes: Anicteric, PERRL  Neck: Supple, trachea midline  Lungs:  Clear to auscultation  Heart: Regular rate and rhythm  Abdomen:  Soft, nontender,   Extremities:  Right AKA, Left BKA  Neurologic: Nonfocal, moving all four extremities  Skin: No lesions  Access: RIJ permcath, left AVF with wound    Basic Metabolic Panel: Recent Labs  Lab 10/26/2019 0730  NA 133*  K 4.7  CL 90*  CO2 19*  GLUCOSE 317*  BUN 67*  CREATININE 8.00*  CALCIUM 9.7    Liver Function Tests: Recent Labs  Lab Oct 26, 2019 0730  AST 18  ALT 9  ALKPHOS 213*  BILITOT 0.7  PROT 7.5  ALBUMIN 3.4*   No results for input(s): LIPASE, AMYLASE in the last 168 hours. No results for input(s): AMMONIA in the last 168 hours.  CBC: Recent Labs  Lab 26-Oct-2019 0730  WBC 27.5*  NEUTROABS 24.6*  HGB 13.1  HCT 42.0  MCV 91.3  PLT 274    Cardiac Enzymes: No results for input(s): CKTOTAL, CKMB, CKMBINDEX, TROPONINI in the last 168 hours.  BNP: Invalid input(s): POCBNP  CBG: No results for input(s): GLUCAP in the last  168 hours.  Microbiology: Results for orders placed or performed during the hospital encounter of October 26, 2019  Respiratory Panel by RT PCR (Flu A&B, Covid) - Nasopharyngeal Swab     Status: Abnormal   Collection Time: 2019/10/26  7:30 AM   Specimen: Nasopharyngeal Swab  Result Value Ref Range Status   SARS Coronavirus 2 by RT PCR POSITIVE (A) NEGATIVE Final    Comment: RESULT CALLED TO, READ BACK BY AND VERIFIED WITH: [facility] PARK RN AT [phone] ON Oct 26, 2019 HASSANR (NOTE) SARS-CoV-2 target nucleic acids are DETECTED. SARS-CoV-2 RNA is generally detectable in upper respiratory specimens  during the acute phase of infection. Positive results are indicative of the presence of the identified virus, but do not rule out bacterial infection or co-infection with other pathogens not detected by the test. Clinical correlation with patient history and other diagnostic information is necessary to determine patient infection status. The expected result is Negative. Fact Sheet for Patients:  PinkCheek.be Fact Sheet for Healthcare Providers: GravelBags.it This test is not yet approved or cleared by the [place] FDA and  has been authorized for detection and/or diagnosis of SARS-CoV-2 by FDA under an Emergency Use Authorization (EUA).  This EUA will remain in effect (meaning this test can be  used) for the duration of  the COVID-19 declaration under Section 564(b)(1) of the Act, 21 U.S.C.  section 360bbb-3(b)(1), unless the authorization is terminated or revoked sooner.    Influenza A by PCR NEGATIVE NEGATIVE Final   Influenza B by PCR NEGATIVE NEGATIVE Final    Comment: (NOTE) The Xpert Xpress SARS-CoV-2/FLU/RSV assay is intended as an aid in  the diagnosis of influenza from Nasopharyngeal swab specimens and  should not be used as a sole basis for treatment. Nasal washings and  aspirates are unacceptable for Xpert Xpress SARS-CoV-2/FLU/RSV   testing. Fact Sheet for Patients: PinkCheek.be Fact Sheet for Healthcare Providers: GravelBags.it This test is not yet approved or cleared by the [place] FDA and  has been authorized for detection and/or diagnosis of SARS-CoV-2 by  FDA under an Emergency Use Authorization (EUA). This EUA will remain  in effect (meaning this test can be used) for the duration of the  Covid-19 declaration under Section 564(b)(1) of the Act, 21  U.S.C. section 360bbb-3(b)(1), unless the authorization is  terminated or revoked. Performed at [facility], [address] [place], [place] [address]     Coagulation Studies: Recent Labs    10-30-19 0730  LABPROT 15.7*  INR 1.3*    Urinalysis: No results for input(s): COLORURINE, LABSPEC, PHURINE, GLUCOSEU, HGBUR, BILIRUBINUR, KETONESUR, PROTEINUR, UROBILINOGEN, NITRITE, LEUKOCYTESUR in the last 72 hours.  Invalid input(s): APPERANCEUR    Imaging: DG Chest Port 1 View  Result Date: 2019-10-30 CLINICAL DATA:  Fever EXAM: PORTABLE CHEST 1 VIEW COMPARISON:  September 21, 2008 FINDINGS: There is a dual lumen catheter with tips in the right atrium. No pneumothorax. There is cardiomegaly with pulmonary venous hypertension. There is interstitial edema. There is no consolidation or appreciable pleural effusion. There is aortic atherosclerosis. No adenopathy. No bone lesions. IMPRESSION: Central catheter as described without pneumothorax. Cardiomegaly with pulmonary vascular congestion and interstitial edema. Suspect a degree of congestive heart failure. Aortic Atherosclerosis (ICD10-I70.0). Electronically Signed   By: Lowella Grip III M.D.   On: 10/30/19 07:51     Medications:   . cefTRIAXone (ROCEPHIN)  IV    . remdesivir 200 mg in sodium chloride 0.9% 250 mL IVPB     Followed by  . [START ON 10/10/2019] remdesivir 100 mg in NS 100 mL     . amiodarone  200 mg Oral Daily  .  vitamin C  500 mg Oral Daily  . aspirin EC  81 mg Oral Daily  . cinacalcet  60 mg Oral Q breakfast  . dexamethasone (DECADRON) injection  6 mg Intravenous Q24H  . heparin  5,000 Units Subcutaneous Q8H  . insulin aspart  0-5 Units Subcutaneous QHS  . insulin aspart  0-6 Units Subcutaneous TID WC  . midodrine  10 mg Oral TID WC  . pantoprazole  40 mg Oral Daily  . sevelamer carbonate  2,400 mg Oral TID WC  . sodium chloride flush  3 mL Intravenous Q12H  . zinc sulfate  220 mg Oral Daily   acetaminophen, chlorpheniramine-HYDROcodone, guaiFENesin-dextromethorphan, ondansetron **OR** ondansetron (ZOFRAN) IV, polyethylene glycol  Assessment/ Plan:  [name] is a 59 y.o. hispanic male with end stage renal disease on hemodialysis, peripheral vascular disease, Right AKA, left BKA, hypertension, CVA, hyperlipidemia, diabetes mellitus type II who is admitted to [facility] on October 30, 2019 for COVID-19 virus infection [U07.1]  MWF [facility]  Left AVF  1. End Stage Renal Disease: Hemodialysis treatment for today   2. Anemia of chronic kidney disease: hemoglobin 13.1 - EPO with HD treatment  3. Hypotension:  - Midodrine  4. Secondary Hyperparathyroidism:  -continue Renvela     LOS: 0 Stanley Lyness Dec 24, 20201:37 PM

## 2019-10-16 NOTE — Progress Notes (Signed)
HD Tx initiated. Pt is COVID positive Airborne precautions maintained. Pt is on RA BP is low. Nephrologist is notified and okay to start dialysis. Pt is hypotensive as per Nephrologist in the outpatient clinic. Primary RN notified as Pt has Midodrine prescribed 3 times a day. Will keep monitor   10/27/2019 1925  Vital Signs  BP (!) 87/51  BP Location Right Arm  BP Method Automatic  Patient Position (if appropriate) Lying  Oxygen Therapy  SpO2 97 %  O2 Device Room Air  Pain Assessment  Pain Scale 0-10  Pain Score 0  During Hemodialysis Assessment  Blood Flow Rate (mL/min) 250 mL/min  Arterial Pressure (mmHg) -100 mmHg  Venous Pressure (mmHg) 220 mmHg  Transmembrane Pressure (mmHg) 20 mmHg  Ultrafiltration Rate (mL/min) 130 mL/min  Dialysate Flow Rate (mL/min) 600 ml/min  Conductivity: Machine  14  HD Safety Checks Performed Yes  Dialysis Fluid Bolus Normal Saline  Bolus Amount (mL) 250 mL  Intra-Hemodialysis Comments Tx initiated

## 2019-10-16 DEATH — deceased

## 2019-10-20 ENCOUNTER — Ambulatory Visit: Payer: Medicare Other | Admitting: Physician Assistant

## 2019-11-18 IMAGING — NM NM GASTRIC EMPTYING
1 series · 10 of 10 positions shown · non-contrast
Comparison: None.

CLINICAL DATA: Postprandial bloating and nausea and vomiting for 3
months.

EXAM:
NUCLEAR MEDICINE GASTRIC EMPTYING SCAN
TECHNIQUE: After oral ingestion of radiolabeled meal, sequential abdominal
images were obtained for 4 hours. Percentage of activity emptying
the stomach was calculated at 1 hour, 2 hour, 3 hour, and 4 hours.
RADIOPHARMACEUTICALS:  2.9 mCi Cc-UUm sulfur colloid in standardized
meal

[Series 1000: gatric statics (results) · 3.90mm/px · 5 acquisitions, 10 frames shown]
[im 1/5]
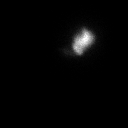
[im 1/5]
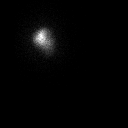
[im 2/5]
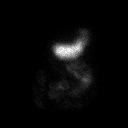
[im 2/5]
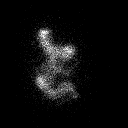
[im 3/5]
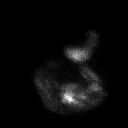
[im 3/5]
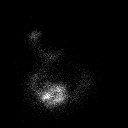
[im 4/5]
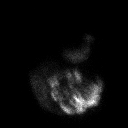
[im 4/5]
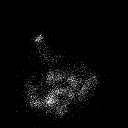
[im 5/5]
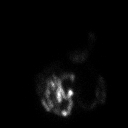
[im 5/5]
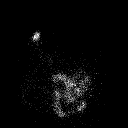

[10 of 10 positions shown; findings below may reference images not displayed]

FINDINGS: Expected location of the stomach in the left upper quadrant.
Ingested meal empties the stomach gradually over the course of the
study.

57% emptied at 1 hr ( normal >= 10%)

84% emptied at 2 hr ( normal >= 40%)

92% emptied at 3 hr ( normal >= 70%)

95% emptied at 4 hr ( normal >= 90%)
IMPRESSION: Normal gastric emptying study.
